# Patient Record
Sex: Male | Born: 1945
Health system: Southern US, Community
[De-identification: ages and names within clinical notes are randomized; demographics above are authoritative.]

## PROBLEM LIST (undated history)

## (undated) ENCOUNTER — Inpatient Hospital Stay: Admission: EM | Payer: Self-pay

## (undated) DIAGNOSIS — H269 Unspecified cataract: Secondary | ICD-10-CM

## (undated) DIAGNOSIS — G4730 Sleep apnea, unspecified: Secondary | ICD-10-CM

## (undated) DIAGNOSIS — E079 Disorder of thyroid, unspecified: Secondary | ICD-10-CM

## (undated) DIAGNOSIS — K219 Gastro-esophageal reflux disease without esophagitis: Secondary | ICD-10-CM

## (undated) DIAGNOSIS — C169 Malignant neoplasm of stomach, unspecified: Secondary | ICD-10-CM

## (undated) DIAGNOSIS — L439 Lichen planus, unspecified: Secondary | ICD-10-CM

## (undated) DIAGNOSIS — I4891 Unspecified atrial fibrillation: Secondary | ICD-10-CM

## (undated) DIAGNOSIS — J45909 Unspecified asthma, uncomplicated: Secondary | ICD-10-CM

## (undated) DIAGNOSIS — L509 Urticaria, unspecified: Secondary | ICD-10-CM

## (undated) DIAGNOSIS — Z79631 Long term (current) use of antimetabolite agent: Secondary | ICD-10-CM

## (undated) DIAGNOSIS — Z79899 Other long term (current) drug therapy: Secondary | ICD-10-CM

## (undated) DIAGNOSIS — L409 Psoriasis, unspecified: Secondary | ICD-10-CM

## (undated) HISTORY — DX: Disorder of thyroid, unspecified: E07.9

## (undated) HISTORY — DX: Psoriasis, unspecified: L40.9

## (undated) HISTORY — PX: TONSILLECTOMY: SUR1361

## (undated) HISTORY — DX: Unspecified cataract: H26.9

## (undated) HISTORY — DX: Urticaria, unspecified: L50.9

## (undated) HISTORY — DX: Sleep apnea, unspecified: G47.30

## (undated) HISTORY — DX: Long term (current) use of antimetabolite agent: Z79.631

## (undated) HISTORY — DX: Lichen planus, unspecified: L43.9

## (undated) HISTORY — DX: Unspecified asthma, uncomplicated: J45.909

## (undated) HISTORY — PX: CATARACT EXTRACTION: SUR2

## (undated) HISTORY — DX: Gastro-esophageal reflux disease without esophagitis: K21.9

## (undated) HISTORY — DX: Other long term (current) drug therapy: Z79.899

---

## 2003-06-08 ENCOUNTER — Ambulatory Visit (HOSPITAL_COMMUNITY): Admission: RE | Admit: 2003-06-08 | Discharge: 2003-06-08 | Payer: Self-pay | Admitting: Gastroenterology

## 2004-08-31 ENCOUNTER — Ambulatory Visit: Payer: Self-pay | Admitting: Sports Medicine

## 2008-11-24 ENCOUNTER — Ambulatory Visit: Payer: Self-pay | Admitting: Sports Medicine

## 2008-11-24 DIAGNOSIS — Q667 Congenital pes cavus, unspecified foot: Secondary | ICD-10-CM | POA: Insufficient documentation

## 2008-11-24 DIAGNOSIS — M79609 Pain in unspecified limb: Secondary | ICD-10-CM

## 2008-11-24 DIAGNOSIS — M7750 Other enthesopathy of unspecified foot: Secondary | ICD-10-CM | POA: Insufficient documentation

## 2008-11-25 ENCOUNTER — Encounter: Payer: Self-pay | Admitting: Sports Medicine

## 2008-12-29 ENCOUNTER — Ambulatory Visit: Payer: Self-pay | Admitting: Sports Medicine

## 2010-09-22 ENCOUNTER — Ambulatory Visit (INDEPENDENT_AMBULATORY_CARE_PROVIDER_SITE_OTHER): Payer: BC Managed Care – PPO | Admitting: Sports Medicine

## 2010-09-22 ENCOUNTER — Encounter: Payer: Self-pay | Admitting: Sports Medicine

## 2010-09-22 VITALS — BP 131/79 | HR 75 | Ht 72.0 in | Wt 280.0 lb

## 2010-09-22 DIAGNOSIS — M75101 Unspecified rotator cuff tear or rupture of right shoulder, not specified as traumatic: Secondary | ICD-10-CM | POA: Insufficient documentation

## 2010-09-22 DIAGNOSIS — S43429A Sprain of unspecified rotator cuff capsule, initial encounter: Secondary | ICD-10-CM

## 2010-09-22 MED ORDER — NITROGLYCERIN 0.2 MG/HR TD PT24: MEDICATED_PATCH | TRANSDERMAL | Status: DC

## 2010-09-22 NOTE — Progress Notes (Signed)
  Subjective:    Patient ID: Todd Harrison, male    DOB: August 09, 1945, 65 y.o.   MRN: [id number]  HPI  Pt presents to clinic for evaluation of anterior rt shoulder pain.  States he had a partial tear in his rt rotator cuff 5 years ago diagnosed by MRI which resulted from weight lifting and a competitive tennis match the next day.  Did rehab on his own for this tear, and pain improved.  Re injured rt shoulder 2 months ago playing tennis, now has rt shoulder pain that feels like burning that radiates into rt upper arm.  Plays upright base which causes pain due to IR.  Has tried ibuprofen, tylenol and diclofenac without relief.  Also has done some of his rehab exercises.  Has been unable to play tennis as much as he would like recently due to the pain with back hand.     New injury occurred when he had to hit a ball and altered position pushing directly forward in front of his body.  He had some immediate pain after that and since has had to back off on his tennis as it hurts anytime he hits.  Review of Systems     Objective:   Physical Exam    Rt scapula slightly internally rotated Mild scapular winging with repeated abduction Speeds and yerguson's negative on rt Slight pain with ER at waist height on rt ER at 90 degrees slightly painful on rt Abduction and elevation slightly painful on rt Significant weakness on rt with empty can Hawkins weak on rt but not painful Neers negative Push off very weak  Obrien's positive Cross over test weak  Musculoskeletal ultrasound of right shoulder Bicipital tendon appears intact and is unremarkable A.c. joint is unremarkable Subscapularis muscle shows an area of hypoechoic change but I think is consistent with a partial tear Supraspinatous muscle shows some areas of calcification, some linear areas of hypoechoic change and some areas of what appear to be retracted fibers.  On scanning for impingement he gets dynamic impingement of his greater  tuberosity under his acromion. Infrasupraspinatous  muscle also shows an area of hypoechoic change near the tendinous insertion.  Assessment & Plan:

## 2010-09-22 NOTE — Patient Instructions (Signed)
Start nitroglycerin patches, apply 1/4 patch to right shoulder daily- you may have headaches for the first 2 weeks Ok to use tens unit  We are referring you to physical therapy- they will call you to set up your appointments.  Return for follow up in 1 month for rescan

## 2010-09-22 NOTE — Assessment & Plan Note (Signed)
This appears to be a significant tear but still is a partial tear since he has pretty full motion and reasonable strength of most muscles of the rotator cuff. I suggested he do formal PT until he can get on a good home exercise program but also want him to work on scapular stabilization for the right shoulder as he shows some winging.  In addition we will begin a nitroglycerin protocol and repeat his scanning in one month and 2 months to see if he is getting a response.  If he responds well will continue this. If not we'll refer him to a shoulder specialist for further assessment.

## 2010-09-22 NOTE — Progress Notes (Signed)
Addended byResa Miner on: 09/22/2010 04:48 PM   Modules accepted: Orders

## 2010-09-23 NOTE — Op Note (Signed)
NAME:  Todd Harrison, Todd Harrison                         ACCOUNT NO.:  [id number]   MEDICAL RECORD NO.:  [id number]                   PATIENT TYPE:  AMB   LOCATION:  ENDO                                 FACILITY:  [facility]   PHYSICIAN:  Graylin Shiver, M.D.                DATE OF BIRTH:  01-27-1946   DATE OF PROCEDURE:  06/08/2003  DATE OF DISCHARGE:                                 OPERATIVE REPORT   PROCEDURE:  Colonoscopy.   INDICATIONS:  Screening.   Informed consent was obtained after explanation of the risks of bleeding,  infection, and perforation.   PREMEDICATION:  Fentanyl 50 mcg IV, Versed 6 mg IV.   DESCRIPTION OF PROCEDURE:  With the patient in the left lateral decubitus  position, a rectal exam was performed.  No masses were felt.  The Olympus  colonoscope was inserted into the rectum and advanced around the colon to  the cecum.  Cecal landmarks were identified.  The cecum and ascending colon  were normal.  The transverse colon normal.  The descending colon, sigmoid,  and rectum were normal.  He tolerated the procedure well without  complication.   IMPRESSION:  Normal colonoscopy to the cecum.   PLAN:  I would recommend follow-up screening colonoscopy again in 10 years.                                               Graylin Shiver, M.D.    Germain Osgood  D:  06/08/2003  T:  06/08/2003  Job:  [id number]   cc:   Antony Madura, M.D.  [address]  [place]  [place] [address]  Fax: [phone]

## 2010-10-12 ENCOUNTER — Ambulatory Visit: Payer: BC Managed Care – PPO | Attending: Sports Medicine | Admitting: Rehabilitative and Restorative Service Providers"

## 2010-10-12 ENCOUNTER — Ambulatory Visit: Payer: BC Managed Care – PPO | Admitting: Rehabilitative and Restorative Service Providers"

## 2010-10-12 DIAGNOSIS — M6281 Muscle weakness (generalized): Secondary | ICD-10-CM | POA: Insufficient documentation

## 2010-10-12 DIAGNOSIS — M25519 Pain in unspecified shoulder: Secondary | ICD-10-CM | POA: Insufficient documentation

## 2010-10-12 DIAGNOSIS — IMO0001 Reserved for inherently not codable concepts without codable children: Secondary | ICD-10-CM | POA: Insufficient documentation

## 2010-10-12 NOTE — Progress Notes (Signed)
Addended by: Annita Brod on: 10/12/2010 09:49 AM   Modules accepted: Orders

## 2010-10-19 ENCOUNTER — Ambulatory Visit: Payer: BC Managed Care – PPO | Admitting: Physical Therapy

## 2010-10-24 ENCOUNTER — Ambulatory Visit: Payer: BC Managed Care – PPO | Admitting: Physical Therapy

## 2010-10-25 ENCOUNTER — Ambulatory Visit (INDEPENDENT_AMBULATORY_CARE_PROVIDER_SITE_OTHER): Payer: BC Managed Care – PPO | Admitting: Family Medicine

## 2010-10-25 ENCOUNTER — Encounter: Payer: Self-pay | Admitting: Family Medicine

## 2010-10-25 VITALS — BP 124/80

## 2010-10-25 DIAGNOSIS — M75101 Unspecified rotator cuff tear or rupture of right shoulder, not specified as traumatic: Secondary | ICD-10-CM

## 2010-10-25 DIAGNOSIS — S43429A Sprain of unspecified rotator cuff capsule, initial encounter: Secondary | ICD-10-CM

## 2010-10-26 NOTE — Assessment & Plan Note (Signed)
30-40% improved over last month. - [facility] ultrasound shows some signs of healing along the subscap.  Continues to have persistent gapping within supraspinatus, but does appear to be getting some calcifications in this area compared to previous ultrasound.  Infraspinatus appeared unchanged. - Should continue with formal physical therapy - Continue with NTG protocol - ok to start using 1/2 patch since 1/4 patch too difficulty to cut & apply.  Warned of potential side effects, should go back to 1/4 patch if having significant headaches or other side effects - Do not feel referral to shoulder specialist necessary at this time since he is improving - Follow-up in 4-6 weeks for re-evaluation & repeat ultrasound - Call with any questions or concerns.

## 2010-10-26 NOTE — Progress Notes (Signed)
  Subjective:    Patient ID: Todd Harrison, male    DOB: October 23, 1945, 65 y.o.   MRN: [id number]  HPI 64yo male to office for f/u of R shoulder RTC tear.  Feels 30-40% improved today.  Has been doing to formal physical therapy & using NTG 1/3 of a patch.  Denies any side effects from NTG.  Has occasional tightness in the right side of his neck, denies any numbness or tingling.  Decreased night time symptoms & able to sleep comfortably most nights.  Review of Systems Per HPI, otherwise negative    Objective:   Physical Exam GEN: AOx3, NAD, pleasant SKIN: no rashes or lesions MSK: - C-spine: good ROM without pain.  No midline TTP, mild Rt paraspinal tenderness, no muscle spasm appreciated - Shoulders: Rt shoulder- scapula still slightly internally rotated & continues to have mild winging with repeated abduction.  TTP at [facility] joint and bicipital groove.  ROM with flexion 160, abd 160 with pain, ER 60 with pain, IR to sacrum with pain.  Weakness & pain with Empty Can, very weak with Push-off test & painful.  Neg Hawkins & Neer, neg Speeds, Obrien's equivocal.  Lt shoulder with good ROM without pain or weakness. Neurovascularly intact distally  MSK U/S: Rt shoulder- normal appearing biceps tendon.  Subscap still with some areas of hypoechoic change, but is starting to develop surrounding calcifications in that area consistent with healing partial tear; slight increased doppler flow.  Supraspinatus again showing areas of calcification, has large area of hyopechoic change with some surrounding calcifications, does appears to have some retracted fibers in this area; slight increased doppler flow.  This is slightly improved from last u/s.  Infraspinatus continues to show hypoechoic change near tendinous insertion that is unchanged.  Continues to exhibit dynamic impingement of greater tuberosity under acromion.  Images saved.       Assessment & Plan:

## 2010-10-27 ENCOUNTER — Ambulatory Visit: Payer: BC Managed Care – PPO | Admitting: Rehabilitative and Restorative Service Providers"

## 2010-10-31 ENCOUNTER — Ambulatory Visit: Payer: BC Managed Care – PPO

## 2010-11-02 ENCOUNTER — Ambulatory Visit: Payer: BC Managed Care – PPO

## 2010-11-14 ENCOUNTER — Encounter: Payer: BC Managed Care – PPO | Admitting: Rehabilitative and Restorative Service Providers"

## 2010-11-16 ENCOUNTER — Ambulatory Visit: Payer: BC Managed Care – PPO | Attending: Sports Medicine | Admitting: Rehabilitative and Restorative Service Providers"

## 2010-11-16 DIAGNOSIS — M25519 Pain in unspecified shoulder: Secondary | ICD-10-CM | POA: Insufficient documentation

## 2010-11-16 DIAGNOSIS — M6281 Muscle weakness (generalized): Secondary | ICD-10-CM | POA: Insufficient documentation

## 2010-11-16 DIAGNOSIS — IMO0001 Reserved for inherently not codable concepts without codable children: Secondary | ICD-10-CM | POA: Insufficient documentation

## 2010-12-06 ENCOUNTER — Ambulatory Visit: Payer: BC Managed Care – PPO | Admitting: Sports Medicine

## 2010-12-16 ENCOUNTER — Ambulatory Visit (INDEPENDENT_AMBULATORY_CARE_PROVIDER_SITE_OTHER): Payer: BC Managed Care – PPO | Admitting: Sports Medicine

## 2010-12-16 VITALS — BP 119/84

## 2010-12-16 DIAGNOSIS — S43429A Sprain of unspecified rotator cuff capsule, initial encounter: Secondary | ICD-10-CM

## 2010-12-16 DIAGNOSIS — M75101 Unspecified rotator cuff tear or rupture of right shoulder, not specified as traumatic: Secondary | ICD-10-CM

## 2010-12-17 NOTE — Progress Notes (Signed)
  Subjective:    Patient ID: Todd Harrison, male    DOB: 1945/09/07, 65 y.o.   MRN: [id number]  HPI  Todd Harrison is a pleasant 65 yo male patient comming to F/U on his R RTC tear. He was seen previously on 10/2010, he was started on the nitro protocol and RTC strengthening exercises. 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  No numbness or tingling distally. He has tolerated the nitro patch w/ no complications.  Patient Active Problem List  Diagnoses  . METATARSALGIA  . FOOT PAIN, LEFT  . TALIPES CAVUS  . Rotator cuff tear, right   Current Outpatient Prescriptions on File Prior to Visit  Medication Sig Dispense Refill  . nitroGLYCERIN (NITRODUR - DOSED IN MG/24 HR) 0.2 mg/hr Apply 1/4 patch to rt shoulder daily.  Change patch every 24 hours  30 patch  2   No Known Allergies History  Substance Use Topics  . Smoking status: Former Games developer  . Smokeless tobacco: Never Used  . Alcohol Use: Not on file      Review of Systems  Constitutional: Negative for fever, chills, diaphoresis and fatigue.  Musculoskeletal:       R shoulder pain w/HPI  Skin: Negative for color change, pallor, rash and wound.  Neurological: Negative for numbness.       Objective:   Physical Exam  Constitutional: He appears well-developed and well-nourished.       BP 119/84   Eyes: EOM are normal.  Pulmonary/Chest: Effort normal.  Musculoskeletal:       Right shoulder with intact skin. No swelling no hematomas. Full range of motion for flexion extension internal, external rotation abduction and adduction. Abduction w/ pain at 110 degrees. Hawkins test mild positive for tear cuff impingement . A.c. Joint with no tenderness to palpation with positive crossover test . Empty can test negative for rotator cuff tear, with weakness and mild pain. Speed test  negative for biceps tendinoplasty No tenderness at the bicipital groove. Strength 5 over 5 with internal and external rotation . Sensation is intact .  Neurological: He is alert.  Skin: Skin is warm. No rash noted. No erythema. No pallor.  Psychiatric: He has a normal mood and affect. Judgment normal.    MSK U/S: Rt shoulder- normal appearing biceps tendon. Subscap still with some areas of hypoechoic change, but is starting to develop surrounding calcifications in that area consistent with healing partial tear; slight increased doppler flow . Supraspinatus again showing areas of calcification,does appears to have some retracted fibers in this area; slight increased doppler flow. This is slightly improved from last u/s.  Infraspinatus with no hypoechoic images present in last U/S change near tendinous insertion that is unchanged.  Dynamic impingement of greater tuberosity under acromion has improved. Images saved.      Assessment & Plan:    1. Rotator cuff tear, right    Cont nitro patch to complete 6 month. Strengthening RTC protocol demonstrated and handout given. He can start playing tennis hitting ground ball and being carefull with serving. F/U in 6 weeks.

## 2011-03-17 ENCOUNTER — Ambulatory Visit (INDEPENDENT_AMBULATORY_CARE_PROVIDER_SITE_OTHER): Payer: BC Managed Care – PPO | Admitting: Sports Medicine

## 2011-03-17 VITALS — BP 136/84

## 2011-03-17 DIAGNOSIS — M75101 Unspecified rotator cuff tear or rupture of right shoulder, not specified as traumatic: Secondary | ICD-10-CM

## 2011-03-17 DIAGNOSIS — S43429A Sprain of unspecified rotator cuff capsule, initial encounter: Secondary | ICD-10-CM

## 2011-03-17 MED ORDER — NITROGLYCERIN 0.2 MG/HR TD PT24: MEDICATED_PATCH | TRANSDERMAL | Status: DC

## 2011-03-17 NOTE — Patient Instructions (Signed)
We will go to 3/4 of a patch. Continue jobes shoulder exercise Motrin 600 mg after playing to come down irritation. F/U in 6 weeks.

## 2011-03-17 NOTE — Progress Notes (Signed)
  Subjective:    Patient ID: Todd Harrison, male    DOB: April 12, 1946, 65 y.o.   MRN: [id number]  HPI  Coming to F/u om R shoulder RTC tear. He has been on the nitro patch sine 10/2010 and in 1/2 patch for 2 month. He is 30% better. He has been able to play. He complain of pain after playing tennis, the pain is located in the anterolateral aspect of the shoulder, 3/10 intensity, sharp, on and off, not radiated, worse after playing tennis, last for few hours. Overall he is improving  Patient Active Problem List  Diagnoses  . METATARSALGIA  . FOOT PAIN, LEFT  . TALIPES CAVUS  . Rotator cuff tear, right    No current outpatient prescriptions on file prior to visit.   No Known Allergies    Review of Systems  Constitutional: Negative for fever, chills, diaphoresis and fatigue.  Musculoskeletal: Negative for myalgias, back pain, joint swelling and gait problem.  Neurological: Negative for weakness and numbness.       Objective:   Physical Exam  Constitutional: He is oriented to person, place, and time. He appears well-developed and well-nourished.       BP 136/84   Neck: Normal range of motion. Neck supple.  Pulmonary/Chest: Effort normal.  Musculoskeletal:       Right shoulder with intact skin. No swelling no hematomas. Full range of motion for flexion extension internal, external rotation abduction and adduction. Abduction w/ pain at 110 degrees. Hawkins test mild positive for tear cuff impingement . A.c. Joint with no tenderness to palpation with positive crossover test . Empty can test negative for rotator cuff tear, with weakness and mild pain. Speed test negative for biceps tendinoplasty No tenderness at the bicipital groove. Strength 4 over 5 with internal and external rotation . Sensation is intact .    Neurological: He is alert and oriented to person, place, and time.  Skin: Skin is warm. No rash noted. No erythema. No pallor.  Psychiatric: He has a normal mood and  affect. His behavior is normal.      MSK U/S : R shoulder with a tear in the supraspinatus, there is a fluid in the subacromial bursa. Positive impingement.AC joint DJD      Assessment & Plan:   1. Rotator cuff tear, right  nitroGLYCERIN (NITRODUR - DOSED IN MG/24 HR) 0.2 mg/hr   We will go to 3/4 of a patch. Continue jobes shoulder exercise Motrin 600 mg after playing to come down irritation. F/U in 6 weeks.

## 2011-05-15 ENCOUNTER — Encounter: Payer: Self-pay | Admitting: Sports Medicine

## 2011-05-15 ENCOUNTER — Ambulatory Visit: Payer: BC Managed Care – PPO | Admitting: Sports Medicine

## 2011-05-15 ENCOUNTER — Ambulatory Visit (INDEPENDENT_AMBULATORY_CARE_PROVIDER_SITE_OTHER): Payer: BC Managed Care – PPO | Admitting: Sports Medicine

## 2011-05-15 VITALS — BP 126/85 | HR 86

## 2011-05-15 DIAGNOSIS — M75101 Unspecified rotator cuff tear or rupture of right shoulder, not specified as traumatic: Secondary | ICD-10-CM

## 2011-05-15 DIAGNOSIS — S43429A Sprain of unspecified rotator cuff capsule, initial encounter: Secondary | ICD-10-CM

## 2011-05-15 MED ORDER — METHYLPREDNISOLONE ACETATE 40 MG/ML IJ SUSP: 40.0000 mg | Freq: Once | INTRAMUSCULAR | Status: DC

## 2011-05-15 NOTE — Progress Notes (Signed)
  Subjective:    Patient ID: Todd Harrison, male    DOB: March 23, 1946, 66 y.o.   MRN: [id number]  HPI 66 y/o male is here for follow up for right shoulder pain secondary to a supraspinatus tear.  He initially improved.  However, on December first after a tennis game he noticed a new pain in the same shoulder.  It started when he went after a wide serve.  Since then he has started having pain with overhead motions and with lifting.  He is still wearing his NTG patches and doing his HEP.   Review of Systems     Objective:   Physical Exam  Abduction to 110 active (180 passive) Forward flexion to 170 active (180 passive) Internal rotation to T10 External rotation to 90  Positive empty can Equivocal Hawkins Negative Speed  Decreased rotator cuff strength but no drop arm  Ultrasound: The previous supraspinatus tear is decreased in size and has calcifications within the tendon c/w healing.  There is a new area of the supraspinatus with a tear.  There is some fluid in the bursa of the infraspinatus.  The subscapularis and biceps tendons are unremarkable.  Dynamic impingement testing is negative.  Consent obtained and verified. Cleansed with alcohol. Topical analgesic spray: Ethyl chloride. Joint: right subacromial Approached in typical fashion from the posterior aspect. Completed without difficulty Meds: 40mg  of depo medrol; 6 ml of lidocaine without epinephrine Needle: 25 g Aftercare instructions and Red flags advised.      Assessment & Plan:

## 2011-05-15 NOTE — Assessment & Plan Note (Signed)
He has re injured the rotator so we will restart his treatment.  He got an injection today which should decrease the inflammation.  He will back off on his HEP, only doing ROM.  When he follows up in 2 weeks we will advance to strength training if he is improved.  Discussed formal physical therapy but the patient prefers to do it at home.  He did have a question about his orthotics.  We will address this in a follow up visit.  On his follow up we will also make him a new pair of orthotics for his feet issues.

## 2011-05-15 NOTE — Patient Instructions (Signed)
1. You can continue with your range of motion exercises but stop doing the strength ones.  2. Follow up with [place] in 2 weeks.  3. If you pain at night you can take ibuprofen.

## 2011-05-30 ENCOUNTER — Ambulatory Visit (INDEPENDENT_AMBULATORY_CARE_PROVIDER_SITE_OTHER): Payer: BC Managed Care – PPO | Admitting: Sports Medicine

## 2011-05-30 VITALS — BP 128/78

## 2011-05-30 DIAGNOSIS — M79609 Pain in unspecified limb: Secondary | ICD-10-CM

## 2011-05-30 DIAGNOSIS — S43429A Sprain of unspecified rotator cuff capsule, initial encounter: Secondary | ICD-10-CM

## 2011-05-30 DIAGNOSIS — M75101 Unspecified rotator cuff tear or rupture of right shoulder, not specified as traumatic: Secondary | ICD-10-CM

## 2011-05-30 DIAGNOSIS — M7750 Other enthesopathy of unspecified foot: Secondary | ICD-10-CM

## 2011-05-30 NOTE — Patient Instructions (Signed)
Motion is much improved  Continue motion with  Walk wall in front and to side  Continue rotator cuff strength with 5 lbs in 3 positions  Lie on back and stretch arms out to the side and hold for 30 seconds- repeat 3 times  Once you have done suggested shoulder exercises for 1-2 weeks- start trying tennis motions with a 5 lb dumbbell   Please follow up in 1 month

## 2011-05-30 NOTE — Assessment & Plan Note (Signed)
Physical exam for the rotator cuff is improved  corticosteroid injection lessened his symptoms a lot  Keep up rehabilitation exercises and we also gave him some range of motion exercises to maintain Use 5 pound dumbbells Below shoulder height for most activities  Continue nitroglycerin protocol  Recheck in 4 weeks and repeat his ultrasound at that visit

## 2011-05-30 NOTE — Assessment & Plan Note (Signed)
He requires a rigid supination post for his left orthotic to keep him from excessive supination On orthotic show that this has worn down  Patient was fitted for a : standard, cushioned, semi-rigid orthotic. The orthotic was heated and afterward the patient stood on the orthotic blank positioned on the orthotic stand. The patient was positioned in subtalar neutral position and 10 degrees of ankle dorsiflexion in a weight bearing stance. After completion of molding, a stable base was applied to the orthotic blank. The blank was ground to a stable position for weight bearing. Size: 11 blue swirl Base: blue EVA Posting: lateral 5th ray post on left Additional orthotic padding:lateral heel post on left  Time 45 mins   Note that corrections were made for his orthotics so that he can continue using these in walking shoes and he can use the new ones in tennis shoes

## 2011-05-30 NOTE — Assessment & Plan Note (Signed)
Continue with metatarsal pads and these are related to his new orthotics

## 2011-05-30 NOTE — Progress Notes (Signed)
Pt here today to f/u with his R rotator cuff injury, which he says is about 40% with the injections;"not where he wants it to be, but better".   He has held off on the exercises until we recheck him He has worked on his motion and feels that he can get his arm over his head easier  Second problem is his left foot and ankle He walks with a lot of supination of the left foot after multiple ankle injuries The right foot is not bothersome  The left foot and ankle become painful if he is not in his orthotics His current pair is 64 years old and does not seem to be providing him as much support  O/ Today he has full elevation of motion of his right shoulder although he does feel some mild discomfort with elevation and abduction about 140 Back scratch test is limited to T10 note that this was a T12 on last visit Left shoulder only shows a back scratch to T9 Internal and external rotation at waist level are full  Strength is good on all testing Empty can test today shows only mild pain and no real weakness Hawkins test shows mild pain only Neer test is negative

## 2011-06-28 ENCOUNTER — Ambulatory Visit (INDEPENDENT_AMBULATORY_CARE_PROVIDER_SITE_OTHER): Payer: BC Managed Care – PPO | Admitting: Sports Medicine

## 2011-06-28 VITALS — BP 130/90

## 2011-06-28 DIAGNOSIS — S43429A Sprain of unspecified rotator cuff capsule, initial encounter: Secondary | ICD-10-CM

## 2011-06-28 DIAGNOSIS — M75101 Unspecified rotator cuff tear or rupture of right shoulder, not specified as traumatic: Secondary | ICD-10-CM

## 2011-06-28 NOTE — Progress Notes (Signed)
  Subjective:    Patient ID: Todd Harrison, male    DOB: 04-05-1946, 66 y.o.   MRN: [id number]  HPI  Pt presents to clinic for f/u of rt shoulder pain which he states is 60-70% improved. He is using 3 lb weights for exercises, and is able to do them over head now. Does not have pain with playing the base.  No night time pain. Still has some soreness with certain motions, like taking cup out of microwave. Continues using 1/4 nitroglycerin patch daily.   He can do tennis strokes with a 3 pound weight without pain but has not tried playing  Review of Systems     Objective:   Physical Exam   NAD Nml ROM  Back scratch to T 9 or 10 on rt, and T 8 on lt Rt shoulder exam: Empty can negative Hawkins negative Speed's and yergason's neg ER, IR, abduction and elevation strong  [facility] ultrasound On last visit we were able to visualize some hypoechoic change in retraction and the distal attachment of the supraspinatous tendon this has now resolved with a small amount of calcification in the area of previous tear Bicipital tendon normal Infraspinatus teres minor and subscapularis are normal  A.c. joint shows only mild arthritis  On dynamic imaging there is still some impingement on repeated abduction        Assessment & Plan:

## 2011-06-28 NOTE — Assessment & Plan Note (Signed)
While we have head to extend his nitroglycerin therapy to 9 months now we are seeing improvement without signs of tear in the supraspinatous and complete resolution of the changes in the infraspinatus and subscapularis.  He is to continue using the nitroglycerin patches for 3 more months  We added a few exercises to his rotator cuff regimen and he should build up to 5 pounds  Gradually resuming some tennis activity  Recheck 3 months

## 2011-06-28 NOTE — Patient Instructions (Signed)
Please continue using nitroglycerin patch for the next 3 months  Continue doing rotator cuff exercises- try increasing gradually to 5lb dumbells Add standing rowing motion and chest flies with light weights Practice doing tennis strokes holding a 3-5lb dumbbell   Please follow up in 3 months  Thank you for seeing [place] today!

## 2014-04-13 ENCOUNTER — Other Ambulatory Visit: Payer: Self-pay | Admitting: Otolaryngology

## 2014-04-13 DIAGNOSIS — R0981 Nasal congestion: Secondary | ICD-10-CM

## 2014-04-13 DIAGNOSIS — J329 Chronic sinusitis, unspecified: Secondary | ICD-10-CM

## 2014-05-15 ENCOUNTER — Ambulatory Visit
Admission: RE | Admit: 2014-05-15 | Discharge: 2014-05-15 | Disposition: A | Discharge status: Home / Self Care | Payer: BLUE CROSS/BLUE SHIELD | Source: Ambulatory Visit | Attending: Otolaryngology | Admitting: Otolaryngology

## 2014-05-15 DIAGNOSIS — R0981 Nasal congestion: Secondary | ICD-10-CM

## 2014-05-15 DIAGNOSIS — J329 Chronic sinusitis, unspecified: Secondary | ICD-10-CM

## 2016-02-10 ENCOUNTER — Other Ambulatory Visit: Payer: Self-pay | Admitting: Internal Medicine

## 2016-02-10 ENCOUNTER — Ambulatory Visit
Admission: RE | Admit: 2016-02-10 | Discharge: 2016-02-10 | Disposition: A | Discharge status: Home / Self Care | Payer: BLUE CROSS/BLUE SHIELD | Source: Ambulatory Visit | Attending: Internal Medicine | Admitting: Internal Medicine

## 2016-02-10 DIAGNOSIS — R059 Cough, unspecified: Secondary | ICD-10-CM

## 2016-02-10 DIAGNOSIS — R05 Cough: Secondary | ICD-10-CM

## 2016-06-09 ENCOUNTER — Ambulatory Visit (INDEPENDENT_AMBULATORY_CARE_PROVIDER_SITE_OTHER): Payer: BLUE CROSS/BLUE SHIELD | Admitting: Family Medicine

## 2016-06-09 VITALS — BP 132/84 | HR 78 | Temp 98.3°F | Resp 18 | Ht 72.0 in | Wt 300.0 lb

## 2016-06-09 DIAGNOSIS — J0101 Acute recurrent maxillary sinusitis: Secondary | ICD-10-CM

## 2016-06-09 DIAGNOSIS — E039 Hypothyroidism, unspecified: Secondary | ICD-10-CM

## 2016-06-09 MED ORDER — AMOXICILLIN-POT CLAVULANATE 875-125 MG PO TABS: 1.0000 | ORAL_TABLET | Freq: Two times a day (BID) | ORAL | 0 refills | Status: DC

## 2016-06-09 NOTE — Patient Instructions (Addendum)

## 2016-06-09 NOTE — Progress Notes (Signed)
  Chief Complaint  Patient presents with  . Sore Throat  . Sinusitis    HPI   Recurrent sinusitis Pt reports that at the end of the summer he was given antibiotic for sinus infection  He reports that last week he started having coughing, postnasal drip, dental pain, sore throat, wheezing, yellow nasal discharge + fevers or chills a week ago He received a flu vaccine He is a former smoker- 35 years ago he quits  Thyroid disease Pt reports that he has been stable on his thyroid dose He denies fatigue, difficulty with maintaining energy lately but admits that in the past he had that He denies missing many of his levothyroxine doses   Past Medical History:  Diagnosis Date  . Thyroid disease     Current Outpatient Prescriptions  Medication Sig Dispense Refill  . FLUTICASONE PROPIONATE HFA IN Inhale into the lungs.    Marland Kitchen levothyroxine (SYNTHROID, LEVOTHROID) 100 MCG tablet Take 100 mcg by mouth daily.    Marland Kitchen omeprazole (PRILOSEC) 20 MG capsule Take 20 mg by mouth daily.    Marland Kitchen amoxicillin-clavulanate (AUGMENTIN) 875-125 MG tablet Take 1 tablet by mouth 2 (two) times daily. 20 tablet 0  . fluticasone (FLONASE) 50 MCG/ACT nasal spray Place into the nose.     Current Facility-Administered Medications  Medication Dose Route Frequency Provider Last Rate Last Dose  . methylPREDNISolone acetate (DEPO-MEDROL) injection 40 mg  40 mg Intra-articular Once Claris Gower, MD        Allergies: No Known Allergies  History reviewed. No pertinent surgical history.  Social History   Social History  . Marital status: Married    Spouse name: N/A  . Number of children: N/A  . Years of education: N/A   Social History Main Topics  . Smoking status: Former Research scientist (life sciences)  . Smokeless tobacco: Never Used  . Alcohol use 0.6 oz/week    1 Glasses of wine per week  . Drug use: No  . Sexual activity: Not Asked   Other Topics Concern  . None   Social History Narrative  . None    ROS See  hpi  Objective: Vitals:   06/09/16 1357  BP: 132/84  Pulse: 78  Resp: 18  Temp: 98.3 F (36.8 C)  TempSrc: Oral  SpO2: 96%  Weight: 300 lb (136.1 kg)  Height: 6' (1.829 m)    Physical Exam General: alert, oriented, in NAD Head: normocephalic, atraumatic, + maxillary sinus tenderness Eyes: EOM intact, no scleral icterus or conjunctival injection Ears: TM clear bilaterally Throat: no pharyngeal exudate or erythema Lymph: no posterior auricular, submental or cervical lymph adenopathy Heart: normal rate, normal sinus rhythm, no murmurs Lungs: clear to auscultation bilaterally, no wheezing   Assessment and Plan Dinesh was seen today for sore throat and sinusitis.  Diagnoses and all orders for this visit:  Acute recurrent maxillary sinusitis- discussed with patient that he should take antibiotic and continue flonase -     amoxicillin-clavulanate (AUGMENTIN) 875-125 MG tablet; Take 1 tablet by mouth 2 (two) times daily.  Acquired hypothyroidism- advised pt to continue his present dose      Duran Ohern A Nolon Rod

## 2016-08-03 ENCOUNTER — Ambulatory Visit (INDEPENDENT_AMBULATORY_CARE_PROVIDER_SITE_OTHER): Payer: BLUE CROSS/BLUE SHIELD | Admitting: Physician Assistant

## 2016-08-03 ENCOUNTER — Ambulatory Visit (INDEPENDENT_AMBULATORY_CARE_PROVIDER_SITE_OTHER): Payer: BLUE CROSS/BLUE SHIELD

## 2016-08-03 ENCOUNTER — Ambulatory Visit: Payer: BLUE CROSS/BLUE SHIELD

## 2016-08-03 VITALS — BP 138/88 | HR 90 | Temp 98.8°F | Resp 18 | Ht 72.0 in | Wt 303.0 lb

## 2016-08-03 DIAGNOSIS — R062 Wheezing: Secondary | ICD-10-CM | POA: Diagnosis not present

## 2016-08-03 DIAGNOSIS — R05 Cough: Secondary | ICD-10-CM

## 2016-08-03 DIAGNOSIS — R059 Cough, unspecified: Secondary | ICD-10-CM

## 2016-08-03 DIAGNOSIS — J329 Chronic sinusitis, unspecified: Secondary | ICD-10-CM | POA: Diagnosis not present

## 2016-08-03 MED ORDER — IPRATROPIUM BROMIDE 0.02 % IN SOLN
0.5000 mg | Freq: Once | RESPIRATORY_TRACT | Status: AC
  Administered 2016-08-03: 0.5 mg via RESPIRATORY_TRACT

## 2016-08-03 MED ORDER — DOXYCYCLINE HYCLATE 100 MG PO CAPS: 100.0000 mg | ORAL_CAPSULE | Freq: Two times a day (BID) | ORAL | 0 refills | Status: DC

## 2016-08-03 MED ORDER — ALBUTEROL SULFATE (2.5 MG/3ML) 0.083% IN NEBU
2.5000 mg | INHALATION_SOLUTION | Freq: Once | RESPIRATORY_TRACT | Status: AC
  Administered 2016-08-03: 2.5 mg via RESPIRATORY_TRACT

## 2016-08-03 NOTE — Progress Notes (Signed)
MRN: [id number] DOB: Dec 25, 1945  Subjective:   Todd Harrison is a 71 y.o. male presenting for chief complaint of URI (sinus pressure/pain, swollen, cough, green plegm, sinus drainage sxs x 1 week , wheezing ) .  Reports 7 day history of sinus congestion, sinus pain and cough. Notes his cough was dry but over the past few days has become productive. He has been coughing up and blowing out green phlegm. Has associated wheezing.  Has tried flonase, neti pot, benedryl with mild elief. Denies fever, ear pain, sore throat, shortness of breath and myalgia, chills, nausea, vomiting, abdominal pain and diarrhea. Has not had sick contact with anyone. Has  history of seasonal allergies, no history of asthma. Patient did not get flu shot this season. Former smoker, quit 30 years, smoked 4 ppd. Denies any other aggravating or relieving factors, no other questions or concerns.  [name] has a current medication list which includes the following prescription(s): fluticasone, fluticasone propionate hfa, levothyroxine, and omeprazole, and the following Facility-Administered Medications: albuterol, ipratropium, and methylprednisolone acetate. Also has No Known Allergies.  [name]  has a past medical history of Thyroid disease. Also  has no past surgical history on file.   Objective:   Vitals: BP 138/88 (BP Location: Right Arm, Patient Position: Sitting, Cuff Size: Large)   Pulse 90   Temp 98.8 F (37.1 C) (Oral)   Resp 18   Ht 6' (1.829 m)   Wt (!) 303 lb (137.4 kg)   SpO2 94%   BMI 41.09 kg/m   Physical Exam  Constitutional: He is oriented to person, place, and time. He appears well-developed and well-nourished. No distress.  HENT:  Head: Normocephalic and atraumatic.  Right Ear: External ear and ear canal normal. Tympanic membrane is injected.  Left Ear: External ear and ear canal normal. Tympanic membrane is injected.  Nose: Mucosal edema (prominent bilatteally) present. Right sinus exhibits  maxillary sinus tenderness and frontal sinus tenderness. Left sinus exhibits maxillary sinus tenderness and frontal sinus tenderness.  Mouth/Throat: Uvula is midline and mucous membranes are normal. Posterior oropharyngeal erythema present. No tonsillar exudate.  Eyes: Conjunctivae are normal.  Neck: Normal range of motion.  Cardiovascular: Normal rate, regular rhythm and normal heart sounds.   Pulmonary/Chest: Effort normal. He has wheezes (right middle lobe). He has rhonchi (right middle lobe ).  Lymphadenopathy:       Head (right side): No submental, no submandibular, no tonsillar, no preauricular, no posterior auricular and no occipital adenopathy present.       Head (left side): No submental, no submandibular, no tonsillar, no preauricular, no posterior auricular and no occipital adenopathy present.    He has no cervical adenopathy.       Right: No supraclavicular adenopathy present.       Left: No supraclavicular adenopathy present.  Neurological: He is alert and oriented to person, place, and time.  Skin: Skin is warm and dry.  Psychiatric: He has a normal mood and affect.  Vitals reviewed.   No results found for this or any previous visit (from the past 24 hour(s)).  Dg Chest 2 View  Result Date: 08/03/2016 CLINICAL DATA:  Cough and wheezing EXAM: CHEST  2 VIEW COMPARISON:  February 10, 2016 FINDINGS: There is no edema or consolidation. The heart size and pulmonary vascularity are normal. No adenopathy. There is atherosclerotic calcification. There is degenerative change in the thoracic spine. IMPRESSION: No edema or consolidation.  There is aortic atherosclerosis. Electronically Signed   By: Gwyndolyn Saxon  Jasmine December III M.D.   On: 08/03/2016 13:04   Post duoneb and depo medrol injection, wheezing has resolved.  Assessment and Plan :  1. Cough - DG Chest 2 View; Future 2. Wheezing -Improved in office. - albuterol (PROVENTIL) (2.5 MG/3ML) 0.083% nebulizer solution 2.5 mg; Take 3 mLs (2.5  mg total) by nebulization once. - ipratropium (ATROVENT) nebulizer solution 0.5 mg; Take 2.5 mLs (0.5 mg total) by nebulization once. -Instructed to use albuterol inhaler every 4-6 hours as needed for wheezing.  3. Sinusitis, unspecified chronicity, unspecified location Due to duration of symptoms, will cover for any underlying bacterial etiology.  - doxycycline (VIBRAMYCIN) 100 MG capsule; Take 1 capsule (100 mg total) by mouth 2 (two) times daily.  Dispense: 20 capsule; Refill: 0  Instructed to return to clinic if symptoms worsen, do not improve, or as needed  Tenna Delaine, PA-C  [facility] 08/03/2016 1:32 PM

## 2016-08-03 NOTE — Patient Instructions (Addendum)
For both your sinus congestion and cough, I am going to treat you with an antibiotic. You should start getting better over the next 24-48 hours, not worse. If no improvement with antibiotic, please return to clinic for further evaluation.  Thank you for letting me participate in your health and well being.   Sinusitis, Adult Sinusitis is soreness and inflammation of your sinuses. Sinuses are hollow spaces in the bones around your face. They are located:  Around your eyes.  In the middle of your forehead.  Behind your nose.  In your cheekbones. Your sinuses and nasal passages are lined with a stringy fluid (mucus). Mucus normally drains out of your sinuses. When your nasal tissues get inflamed or swollen, the mucus can get trapped or blocked so air cannot flow through your sinuses. This lets bacteria, viruses, and funguses grow, and that leads to infection. Follow these instructions at home: Medicines   Take, use, or apply over-the-counter and prescription medicines only as told by your doctor. These may include nasal sprays.  If you were prescribed an antibiotic medicine, take it as told by your doctor. Do not stop taking the antibiotic even if you start to feel better. Hydrate and Humidify   Drink enough water to keep your pee (urine) clear or pale yellow.  Use a cool mist humidifier to keep the humidity level in your home above 50%.  Breathe in steam for 10-15 minutes, 3-4 times a day or as told by your doctor. You can do this in the bathroom while a hot shower is running.  Try not to spend time in cool or dry air. Rest   Rest as much as possible.  Sleep with your head raised (elevated).  Make sure to get enough sleep each night. General instructions   Put a warm, moist washcloth on your face 3-4 times a day or as told by your doctor. This will help with discomfort.  Wash your hands often with soap and water. If there is no soap and water, use hand sanitizer.  Do not  smoke. Avoid being around people who are smoking (secondhand smoke).  Keep all follow-up visits as told by your doctor. This is important. Contact a doctor if:  You have a fever.  Your symptoms get worse.  Your symptoms do not get better within 10 days. Get help right away if:  You have a very bad headache.  You cannot stop throwing up (vomiting).  You have pain or swelling around your face or eyes.  You have trouble seeing.  You feel confused.  Your neck is stiff.  You have trouble breathing. This information is not intended to replace advice given to you by your health care provider. Make sure you discuss any questions you have with your health care provider. Document Released: 10/11/2007 Document Revised: 12/19/2015 Document Reviewed: 02/17/2015 Elsevier Interactive Patient Education  2017 [employer].    IF you received an x-ray today, you will receive an invoice from [facility]. Please contact [facility] at [phone] with questions or concerns regarding your invoice.   IF you received labwork today, you will receive an invoice from [facility]. Please contact [facility] at [phone] with questions or concerns regarding your invoice.   Our billing staff will not be able to assist you with questions regarding bills from these companies.  You will be contacted with the lab results as soon as they are available. The fastest way to get your results is to activate your My Chart account. Instructions  are located on the last page of this paperwork. If you have not heard from [place] regarding the results in 2 weeks, please contact this office.

## 2016-08-10 ENCOUNTER — Ambulatory Visit (INDEPENDENT_AMBULATORY_CARE_PROVIDER_SITE_OTHER): Payer: BLUE CROSS/BLUE SHIELD | Admitting: Physician Assistant

## 2016-08-10 VITALS — BP 134/80 | HR 71 | Temp 97.5°F | Resp 18 | Wt 301.4 lb

## 2016-08-10 DIAGNOSIS — R062 Wheezing: Secondary | ICD-10-CM

## 2016-08-10 DIAGNOSIS — R21 Rash and other nonspecific skin eruption: Secondary | ICD-10-CM

## 2016-08-10 DIAGNOSIS — R05 Cough: Secondary | ICD-10-CM | POA: Diagnosis not present

## 2016-08-10 DIAGNOSIS — R059 Cough, unspecified: Secondary | ICD-10-CM

## 2016-08-10 LAB — CMP14+EGFR
ALBUMIN: 3.6 g/dL (ref 3.6–4.8)
ALT: 18 IU/L (ref 0–44)
AST: 16 IU/L (ref 0–40)
Albumin/Globulin Ratio: 1.5 (ref 1.2–2.2)
Alkaline Phosphatase: 62 IU/L (ref 39–117)
BILIRUBIN TOTAL: 0.4 mg/dL (ref 0.0–1.2)
BUN/Creatinine Ratio: 12 (ref 10–24)
BUN: 14 mg/dL (ref 8–27)
CALCIUM: 8.9 mg/dL (ref 8.6–10.2)
CHLORIDE: 102 mmol/L (ref 96–106)
CO2: 22 mmol/L (ref 18–29)
Creatinine, Ser: 1.2 mg/dL (ref 0.76–1.27)
GFR calc non Af Amer: 62 mL/min/{1.73_m2} (ref >59)
GFR, EST AFRICAN AMERICAN: 71 mL/min/{1.73_m2} (ref >59)
GLUCOSE: 106 mg/dL — AB (ref 65–99)
Globulin, Total: 2.4 g/dL (ref 1.5–4.5)
Potassium: 4.4 mmol/L (ref 3.5–5.2)
Sodium: 139 mmol/L (ref 134–144)
TOTAL PROTEIN: 6 g/dL (ref 6.0–8.5)

## 2016-08-10 LAB — POCT CBC
Granulocyte percent: 62.8 %G (ref 37–80)
HEMATOCRIT: 42.3 % — AB (ref 43.5–53.7)
HEMOGLOBIN: 14.8 g/dL (ref 14.1–18.1)
LYMPH, POC: 2.3 (ref 0.6–3.4)
MCH, POC: 31.2 pg (ref 27–31.2)
MCHC: 35 g/dL (ref 31.8–35.4)
MCV: 89.2 fL (ref 80–97)
MID (cbc): 0.4 (ref 0–0.9)
MPV: 6.9 fL (ref 0–99.8)
POC GRANULOCYTE: 4.5 (ref 2–6.9)
POC LYMPH %: 31.9 % (ref 10–50)
POC MID %: 5.3 %M (ref 0–12)
Platelet Count, POC: 258 10*3/uL (ref 142–424)
RBC: 4.75 M/uL (ref 4.69–6.13)
RDW, POC: 13.2 %
WBC: 7.2 10*3/uL (ref 4.6–10.2)

## 2016-08-10 LAB — POCT URINALYSIS DIP (MANUAL ENTRY)
BILIRUBIN UA: NEGATIVE
Blood, UA: NEGATIVE
Glucose, UA: NEGATIVE
Ketones, POC UA: NEGATIVE
LEUKOCYTES UA: NEGATIVE
NITRITE UA: NEGATIVE
PH UA: 6.5 (ref 5.0–8.0)
Protein Ur, POC: NEGATIVE
Spec Grav, UA: 1.025 (ref 1.030–1.035)
Urobilinogen, UA: 1 (ref ?–2.0)

## 2016-08-10 LAB — POC MICROSCOPIC URINALYSIS (UMFC): Mucus: ABSENT

## 2016-08-10 MED ORDER — IPRATROPIUM BROMIDE HFA 17 MCG/ACT IN AERS: 2.0000 | INHALATION_SPRAY | RESPIRATORY_TRACT | 0 refills | Status: DC | PRN

## 2016-08-10 MED ORDER — ALBUTEROL SULFATE (2.5 MG/3ML) 0.083% IN NEBU
2.5000 mg | INHALATION_SOLUTION | Freq: Once | RESPIRATORY_TRACT | Status: AC
  Administered 2016-08-10: 2.5 mg via RESPIRATORY_TRACT

## 2016-08-10 MED ORDER — IPRATROPIUM BROMIDE 0.02 % IN SOLN
0.5000 mg | Freq: Once | RESPIRATORY_TRACT | Status: AC
  Administered 2016-08-10: 0.5 mg via RESPIRATORY_TRACT

## 2016-08-10 MED ORDER — PREDNISONE 20 MG PO TABS: 40.0000 mg | ORAL_TABLET | Freq: Every day | ORAL | 0 refills | Status: AC

## 2016-08-10 NOTE — Progress Notes (Signed)
MRN: [id number] DOB: February 26, 1946  Subjective:   Todd Harrison is a 71 y.o. male presenting for follow up on cough.   Initially seen on 08/03/16 for sinus congestion and cough x 1 week. He had wheezes on PE. CXR negative. Was given one dose of depomedrol, a breathing treatment, and Rx for doxycycline. States he felt really good the night after his visit and the next day but then the coughing and wheezing returned.  Nothing has worsened but they have not gotten better. He is also having a little SOB. His sinus pressure has completely resolved. Denies fever, chills, chest pain, and heart palpitations. He is on day 7 of doxycycline. He is also taking robitussin DM and albuterol inhaler PRN.  Pt also noticed that he developed a small rash on his right wrist yesterday. Denies itching, pain, redness, and warmth.   Lash has a current medication list which includes the following prescription(s): doxycycline, levothyroxine, omeprazole, fluticasone, fluticasone propionate hfa, ipratropium, and prednisone, and the following Facility-Administered Medications: methylprednisolone acetate. Also has No Known Allergies.  Asmar  has a past medical history of Thyroid disease. Also  has no past surgical history on file.   Objective:   Vitals: BP 134/80 (Cuff Size: Large)   Pulse 71   Temp 97.5 F (36.4 C) (Oral)   Resp 18   Wt (!) 301 lb 6.4 oz (136.7 kg)   SpO2 94%   BMI 40.88 kg/m   Physical Exam  Constitutional: He is oriented to person, place, and time. He appears well-developed and well-nourished. No distress.  HENT:  Head: Normocephalic and atraumatic.  Right Ear: Tympanic membrane, external ear and ear canal normal.  Left Ear: Tympanic membrane, external ear and ear canal normal.  Nose: Right sinus exhibits maxillary sinus tenderness (mild). Left sinus exhibits maxillary sinus tenderness (mild).  Mouth/Throat: Uvula is midline and mucous membranes are normal. Posterior oropharyngeal  erythema present. Tonsils are 0 on the right. Tonsils are 0 on the left.  Eyes: Conjunctivae are normal.  Neck: Normal range of motion.  Cardiovascular: Normal rate, regular rhythm, normal heart sounds and intact distal pulses.   Pulmonary/Chest: Effort normal. He has wheezes (intermittment, more prominent on right lung field). He has rhonchi (diffuse, cleared with cough, more prominent on right lung field).  Lymphadenopathy:       Head (right side): Submandibular adenopathy present. No submental, no tonsillar, no preauricular, no posterior auricular and no occipital adenopathy present.       Head (left side): Submandibular adenopathy present. No submental, no tonsillar, no preauricular, no posterior auricular and no occipital adenopathy present.    He has no cervical adenopathy.       Right: No supraclavicular adenopathy present.       Left: No supraclavicular adenopathy present.  Neurological: He is alert and oriented to person, place, and time.  Skin: Skin is warm and dry.  Small area of nonblanchable petechial rash noted on right anterior wrist.   Psychiatric: He has a normal mood and affect.  Vitals reviewed.   Results for orders placed or performed in visit on 08/10/16 (from the past 24 hour(s))  POCT CBC     Status: Abnormal   Collection Time: 08/10/16 11:23 AM  Result Value Ref Range   WBC 7.2 4.6 - 10.2 K/uL   Lymph, poc 2.3 0.6 - 3.4   POC LYMPH PERCENT 31.9 10 - 50 %L   MID (cbc) 0.4 0 - 0.9   POC MID % 5.3  0 - 12 %M   POC Granulocyte 4.5 2 - 6.9   Granulocyte percent 62.8 37 - 80 %G   RBC 4.75 4.69 - 6.13 M/uL   Hemoglobin 14.8 14.1 - 18.1 g/dL   HCT, POC 42.3 (A) 43.5 - 53.7 %   MCV 89.2 80 - 97 fL   MCH, POC 31.2 27 - 31.2 pg   MCHC 35.0 31.8 - 35.4 g/dL   RDW, POC 13.2 %   Platelet Count, POC 258 142 - 424 K/uL   MPV 6.9 0 - 99.8 fL  POCT urinalysis dipstick     Status: None   Collection Time: 08/10/16 12:07 PM  Result Value Ref Range   Color, UA yellow yellow     Clarity, UA clear clear   Glucose, UA negative negative   Bilirubin, UA negative negative   Ketones, POC UA negative negative   Spec Grav, UA 1.025 1.030 - 1.035   Blood, UA negative negative   pH, UA 6.5 5.0 - 8.0   Protein Ur, POC negative negative   Urobilinogen, UA 1.0 Negative - 2.0   Nitrite, UA Negative Negative   Leukocytes, UA Negative Negative  POCT Microscopic Urinalysis ([facility])     Status: Abnormal   Collection Time: 08/10/16 12:16 PM  Result Value Ref Range   WBC,UR,HPF,POC None None WBC/hpf   RBC,UR,HPF,POC None None RBC/hpf   Bacteria None None, Too numerous to count   Mucus Absent Absent   Epithelial Cells, UR Per Microscopy Few (A) None, Too numerous to count cells/hpf   Post duoneb, pt reports feeling better. His wheezing has improved.  Assessment and Plan :  1. Wheezing Will attempt short course prednisone. Given Rx for atrovent inhaler to use along with albuterol inhaler every 4-6 hours PRN.  - POCT CBC - albuterol (PROVENTIL) (2.5 MG/3ML) 0.083% nebulizer solution 2.5 mg; Take 3 mLs (2.5 mg total) by nebulization once. - ipratropium (ATROVENT) nebulizer solution 0.5 mg; Take 2.5 mLs (0.5 mg total) by nebulization once. - predniSONE (DELTASONE) 20 MG tablet; Take 2 tablets (40 mg total) by mouth daily with breakfast.  Dispense: 10 tablet; Refill: 0 - ipratropium (ATROVENT HFA) 17 MCG/ACT inhaler; Inhale 2 puffs into the lungs every 4 (four) hours as needed for wheezing.  Dispense: 1 Inhaler; Refill: 0 2. Cough -Instructed to complete antibiotic as prescribed.  3. Rash and nonspecific skin eruption Labs pending, instructed to d/c prednisone if it leads to worsening rash.  - CMP14+EGFR - POCT urinalysis dipstick - POCT Microscopic Urinalysis ([facility])  Tenna Delaine, PA-C  [facility] 08/10/2016 7:58 PM

## 2016-08-10 NOTE — Patient Instructions (Addendum)
Your point of care lab results are reassuring today, I will have your other lab results in a couple of days.   For your cough and wheezing, I want you to continue doxy until it is complete. Also I would like you to take 40mg  prednisone for the next five days. I have given you an atrovent inhaler and you can use this along with your albuterol inhaler every 4-6 hours as needed. If your rash gets worse on prednisone, please stop it and call our office. Otherwise, return to clinic if symptoms worsen, do not improve in 5 days, or as needed     Acute Bronchitis, Adult Acute bronchitis is when air tubes (bronchi) in the lungs suddenly get swollen. The condition can make it hard to breathe. It can also cause these symptoms:  A cough.  Coughing up clear, yellow, or green mucus.  Wheezing.  Chest congestion.  Shortness of breath.  A fever.  Body aches.  Chills.  A sore throat. Follow these instructions at home: Medicines   Take over-the-counter and prescription medicines only as told by your doctor.  If you were prescribed an antibiotic medicine, take it as told by your doctor. Do not stop taking the antibiotic even if you start to feel better. General instructions   Rest.  Drink enough fluids to keep your pee (urine) clear or pale yellow.  Avoid smoking and secondhand smoke. If you smoke and you need help quitting, ask your doctor. Quitting will help your lungs heal faster.  Use an inhaler, cool mist vaporizer, or humidifier as told by your doctor.  Keep all follow-up visits as told by your doctor. This is important. How is this prevented? To lower your risk of getting this condition again:  Wash your hands often with soap and water. If you cannot use soap and water, use hand sanitizer.  Avoid contact with people who have cold symptoms.  Try not to touch your hands to your mouth, nose, or eyes.  Make sure to get the flu shot every year. Contact a doctor if:  Your  symptoms do not get better in 2 weeks. Get help right away if:  You cough up blood.  You have chest pain.  You have very bad shortness of breath.  You become dehydrated.  You faint (pass out) or keep feeling like you are going to pass out.  You keep throwing up (vomiting).  You have a very bad headache.  Your fever or chills gets worse. This information is not intended to replace advice given to you by your health care provider. Make sure you discuss any questions you have with your health care provider. Document Released: 10/11/2007 Document Revised: 12/01/2015 Document Reviewed: 10/13/2015 Elsevier Interactive Patient Education  2017 [employer].   IF you received an x-ray today, you will receive an invoice from [facility]. Please contact [facility] at [phone] with questions or concerns regarding your invoice.   IF you received labwork today, you will receive an invoice from [facility]. Please contact [facility] at [phone] with questions or concerns regarding your invoice.   Our billing staff will not be able to assist you with questions regarding bills from these companies.  You will be contacted with the lab results as soon as they are available. The fastest way to get your results is to activate your My Chart account. Instructions are located on the last page of this paperwork. If you have not heard from [place] regarding the results in 2 weeks, please  contact this office.

## 2016-09-15 ENCOUNTER — Ambulatory Visit: Payer: BLUE CROSS/BLUE SHIELD | Admitting: Physician Assistant

## 2016-09-19 ENCOUNTER — Other Ambulatory Visit: Payer: Self-pay | Admitting: Internal Medicine

## 2016-09-19 DIAGNOSIS — N3091 Cystitis, unspecified with hematuria: Secondary | ICD-10-CM

## 2018-01-14 ENCOUNTER — Ambulatory Visit: Payer: BLUE CROSS/BLUE SHIELD | Admitting: Physician Assistant

## 2018-09-03 ENCOUNTER — Other Ambulatory Visit: Payer: Self-pay

## 2018-09-03 ENCOUNTER — Encounter: Payer: Self-pay | Admitting: Allergy and Immunology

## 2018-09-03 ENCOUNTER — Ambulatory Visit: Payer: Medicare HMO | Admitting: Allergy and Immunology

## 2018-09-03 DIAGNOSIS — J3089 Other allergic rhinitis: Secondary | ICD-10-CM

## 2018-09-03 DIAGNOSIS — L301 Dyshidrosis [pompholyx]: Secondary | ICD-10-CM

## 2018-09-03 DIAGNOSIS — L989 Disorder of the skin and subcutaneous tissue, unspecified: Secondary | ICD-10-CM

## 2018-09-03 DIAGNOSIS — L308 Other specified dermatitis: Secondary | ICD-10-CM

## 2018-09-03 DIAGNOSIS — L230 Allergic contact dermatitis due to metals: Secondary | ICD-10-CM | POA: Diagnosis not present

## 2018-09-03 DIAGNOSIS — J4520 Mild intermittent asthma, uncomplicated: Secondary | ICD-10-CM

## 2018-09-03 MED ORDER — FLUTICASONE PROPIONATE 50 MCG/ACT NA SUSP: 2.0000 | Freq: Every day | NASAL | 5 refills | Status: DC

## 2018-09-03 MED ORDER — ALBUTEROL SULFATE HFA 108 (90 BASE) MCG/ACT IN AERS: 2.0000 | INHALATION_SPRAY | Freq: Four times a day (QID) | RESPIRATORY_TRACT | 2 refills | Status: DC | PRN

## 2018-09-03 MED ORDER — FLUTICASONE PROPIONATE HFA 110 MCG/ACT IN AERO: INHALATION_SPRAY | RESPIRATORY_TRACT | 3 refills | Status: DC

## 2018-09-03 MED ORDER — TACROLIMUS 0.1 % EX OINT: TOPICAL_OINTMENT | Freq: Two times a day (BID) | CUTANEOUS | 0 refills | Status: DC

## 2018-09-03 NOTE — Progress Notes (Signed)
[facility]   NEW PATIENT NOTE  Referring Provider: No ref. provider found Primary Provider: Lorene Dy, MD Date of office visit: 09/03/2018    Subjective:   Chief Complaint:  Todd Harrison (DOB: [birth year]) is a 73 y.o. male who presents to the clinic on 09/03/2018 with a chief complaint of Eczema (hands and feet 30+ years bilateral ) and Urticaria (Lichen Planus ) .  HPI: Todd Harrison presents to this clinic in evaluation of dermatitis.  He has developed dyshidrotic eczema involving palms and feet for about 30 years or so that was under pretty good control with the use of halobetasol cream.  About 1 and half years ago he started to develop a more extensive dermatitis involving other areas of his body, went to see a dermatologist and he was biopsied and told that he has lichen planus as well as dyshidrotic eczema. Around December 2019 his dyshidrotic eczema exploded to the point where he could not even walk.  He was started on methotrexate and apparently topical triamcinolone and nothing really helped this issue and subsequently he had ankle and feet swelling and was diagnosed with a cellulitis and treated with an antibiotic.  He never really improved until he made 1 significant environmental change about 1 month ago.  At that point in time he eliminated the use of his metal frame glasses and started plastic glasses and within 24 hours or so he noticed much less itching of the skin and over the course of this past month he has improved significantly.  However, he still has significant problems with his feet even though his palms have cleared up.  He does not really note any obvious provoking factor giving rise to this issue.  He has had a history of nickel allergy in the distant past and he is pretty cognizant about avoiding nickel.  He does play the base guitar and he changed his strings about 1 year ago but he is not sure if the string still have  metal.  Other atopic symptomatology includes a long history of sinus problems that he treats with saline and antihistamine and intermittent nasal steroid successfully.  He has apparently had some intermittent issues with wheezing especially whenever he develops a head cold.  He has had physician documented wheezing in 2018 and was given therapy for that issue but he does not really use any therapy on a consistent basis.  He did smoke tobacco for about 10 to 12 years which he discontinued in 1982.  He has been given Flovent and bronchodilator which he does not use unless he is sick.  Past Medical History:  Diagnosis Date   Lichen planus    Thyroid disease     Past Surgical History:  Procedure Laterality Date   CATARACT EXTRACTION Left    2019    Allergies as of 09/03/2018   No Known Allergies     Medication List      augmented betamethasone dipropionate 0.05 % cream Commonly known as:  DIPROLENE-AF APPLY TO AFFECTED AREA EVERY DAY   clobetasol ointment 0.05 % Commonly known as:  TEMOVATE AFFECTED EAR TWICE A DAY **NOT FOR FACE OR SKIN FOLD AREA** USE FOR 15 DAYS   doxycycline 100 MG capsule Commonly known as:  VIBRAMYCIN Take 1 capsule (100 mg total) by mouth 2 (two) times daily.   fluticasone 50 MCG/ACT nasal spray Commonly known as:  FLONASE Place into the nose.   FLUTICASONE PROPIONATE  HFA IN Inhale into the lungs.   folic acid 1 MG tablet Commonly known as:  FOLVITE Take 1 mg by mouth daily.   ipratropium 17 MCG/ACT inhaler Commonly known as:  Atrovent HFA Inhale 2 puffs into the lungs every 4 (four) hours as needed for wheezing.   levothyroxine 100 MCG tablet Commonly known as:  SYNTHROID Take 100 mcg by mouth daily.   methotrexate 2.5 MG tablet Commonly known as:  RHEUMATREX   triamcinolone cream 0.1 % Commonly known as:  KENALOG   urea 40 % Crea Commonly known as:  CARMOL APPLY 1 APPLICATION AS NEEDED ONCE A DAY EXTERNALLY       Review of  systems negative except as noted in HPI / PMHx or noted below:  Review of Systems  Constitutional: Negative.   HENT: Negative.   Eyes: Negative.   Respiratory: Negative.   Cardiovascular: Negative.   Gastrointestinal: Negative.   Genitourinary: Negative.   Musculoskeletal: Negative.   Skin: Negative.   Neurological: Negative.   Endo/Heme/Allergies: Negative.   Psychiatric/Behavioral: Negative.     Family History  Problem Relation Age of Onset   Diabetes Mother    Stroke Mother     Social History   Socioeconomic History   Marital status: Married    Spouse name: Not on file   Number of children: Not on file   Years of education: Not on file   Highest education level: Not on file  Occupational History   Not on file  Social Needs   Financial resource strain: Not on file   Food insecurity:    Worry: Not on file    Inability: Not on file   Transportation needs:    Medical: Not on file    Non-medical: Not on file  Tobacco Use   Smoking status: Former Smoker   Smokeless tobacco: Never Used  Substance and Sexual Activity   Alcohol use: Yes    Alcohol/week: 1.0 standard drinks    Types: 1 Glasses of wine per week   Drug use: No   Sexual activity: Not on file  Lifestyle   Physical activity:    Days per week: Not on file    Minutes per session: Not on file   Stress: Not on file  Relationships   Social connections:    Talks on phone: Not on file    Gets together: Not on file    Attends religious service: Not on file    Active member of club or organization: Not on file    Attends meetings of clubs or organizations: Not on file    Relationship status: Not on file   Intimate partner violence:    Fear of current or ex partner: Not on file    Emotionally abused: Not on file    Physically abused: Not on file    Forced sexual activity: Not on file  Other Topics Concern   Not on file  Social History Narrative   Not on file    Environmental  and Social history  Lives in a house with a dry environment, cats located inside the household, carpet in the bedroom, no plastic on the bed, no plastic on the pillow, no smokers look inside the household.  Objective:  There were no vitals filed for this visit.      Physical Exam Constitutional:      Appearance: He is not diaphoretic.  HENT:     Head: Normocephalic.     Right Ear: Tympanic membrane, ear  canal and external ear normal.     Left Ear: Tympanic membrane, ear canal and external ear normal.     Nose: Nose normal. No mucosal edema or rhinorrhea.     Mouth/Throat:     Pharynx: Uvula midline. No oropharyngeal exudate.  Eyes:     Conjunctiva/sclera: Conjunctivae normal.  Neck:     Thyroid: No thyromegaly.     Trachea: Trachea normal. No tracheal tenderness or tracheal deviation.  Cardiovascular:     Rate and Rhythm: Normal rate and regular rhythm.     Heart sounds: Normal heart sounds, S1 normal and S2 normal. No murmur.  Pulmonary:     Effort: No respiratory distress.     Breath sounds: Normal breath sounds. No stridor. No wheezing or rales.  Lymphadenopathy:     Head:     Right side of head: No tonsillar adenopathy.     Left side of head: No tonsillar adenopathy.     Cervical: No cervical adenopathy.  Skin:    Findings: Rash (Extensive scale involving feet with surrounding erythema.  Multiple erythematous blanching lesions across shin and arms.) present. No erythema.     Nails: There is no clubbing.   Neurological:     Mental Status: He is alert.     Diagnostics: Allergy skin tests were performed.  He demonstrated hypersensitivity to grass without any hypersensitivity directed against a screening panel of foods.  Spirometry was performed and demonstrated an FEV1 of 3.91 @ 111 % of predicted. FEV1/FVC = 0.83  Assessment and Plan:    1. Inflammatory dermatosis   2. Dyshidrotic eczema   3. Allergic contact dermatitis due to metals   4. Perennial allergic  rhinitis   5. Asthma, mild intermittent, well-controlled     1.  Avoidance measures  2.  Add Protopic ointment in addition to topical steroid cream to feet daily  3.  Can continue Flonase 1-2 sprays each nostril 1 time per day during periods of upper airway symptoms and continue nasal saline and antihistamine as needed  4.  Can continue a "action plan" for wheezing associated with colds including the following:   A.  Flovent 110-3 inhalations 3 times a day (9)  B.  Albuterol HFA 2 inhalations every 4-6 hours if needed  5.  Arrange for patch testing directed against metals  6.  Further evaluation and treatment?  7.  Return to clinic in 4 weeks or earlier if problem  Todd Harrison has some form of hyperkeratotic dyshidrotic eczema that has really increased in intensity over the course of the past 6 months or so and has been less than responsive to the administration of methotrexate and topical anti-inflammatory agents including high potency topical steroids.  I will add in LA calcineurin inhibitor to his anti-inflammatory medical regime as noted above.  We will further investigate his possible environmental hypersensitivity by having him perform patch testing directed against metals as he did note some improvement regarding his inflammatory condition when he made an attempt to avoid metal exposure in the past.  He will remain on anti-inflammatory agents for his airway with the use of a nasal steroid and I have given him a "action plan" to be utilized which includes an high-dose inhaled steroid should he develop a flare of his asthma in the future.  I will see him back in this clinic in 4 weeks or earlier if there is a problem.  Allena Katz, MD Allergy / Immunology [place]

## 2018-09-03 NOTE — Patient Instructions (Addendum)
  1.  Avoidance measures  2.  Add Protopic ointment in addition to topical steroid cream to feet daily  3.  Can continue Flonase 1-2 sprays each nostril 1 time per day during periods of upper airway symptoms and continue nasal saline and antihistamine as needed  4.  Can continue a "action plan" for wheezing associated with colds including the following:   A.  Flovent 110-3 inhalations 3 times a day (9)  B.  Albuterol HFA 2 inhalations every 4-6 hours if needed  5.  Arrange for patch testing directed against metals  6.  Further evaluation and treatment?  7.  Return to clinic in 4 weeks or earlier if problem

## 2018-09-04 ENCOUNTER — Encounter: Payer: Self-pay | Admitting: Allergy and Immunology

## 2018-09-09 ENCOUNTER — Ambulatory Visit (INDEPENDENT_AMBULATORY_CARE_PROVIDER_SITE_OTHER): Payer: Medicare HMO | Admitting: Allergy

## 2018-09-09 ENCOUNTER — Encounter: Payer: Self-pay | Admitting: Allergy

## 2018-09-09 ENCOUNTER — Other Ambulatory Visit: Payer: Self-pay

## 2018-09-09 VITALS — BP 128/70 | HR 80 | Resp 16

## 2018-09-09 DIAGNOSIS — L230 Allergic contact dermatitis due to metals: Secondary | ICD-10-CM | POA: Diagnosis not present

## 2018-09-09 DIAGNOSIS — L239 Allergic contact dermatitis, unspecified cause: Secondary | ICD-10-CM | POA: Insufficient documentation

## 2018-09-09 NOTE — Assessment & Plan Note (Signed)
TRUE and metal patches placed today.  Continue proper skin care measures.

## 2018-09-09 NOTE — Progress Notes (Signed)
   Follow Up Note  RE: Todd Harrison MRN: [id number] DOB: 1945/11/01 Date of Office Visit: 09/09/2018  Referring provider: Lorene Dy, MD Primary care provider: Lorene Dy, MD  History of Present Illness: I had the pleasure of seeing Todd Harrison for a follow up visit at the [facility] on 09/09/2018. He is a 73 y.o. male, who is being followed for dermatitis. Today he is here for patch test placement, given suspected history of contact dermatitis.   Diagnostics: TRUE Test and metal patches placed.   Assessment and Plan: Todd Harrison is a 73 y.o. male with: Allergic contact dermatitis TRUE and metal patches placed today.  Continue proper skin care measures.   The patient was instructed regarding proper care of the patches for the next 48 hours. Do not get patches wet - avoid showering until the next visit. Do not engage in vigorous physical activity.  Patient will follow up in 48 hours and 96 hours for patch readings.  It was my pleasure to see Todd Harrison today and participate in his care. Please feel free to contact me with any questions or concerns.  Sincerely,  Rexene Alberts, DO Allergy & Immunology  [facility] office: [phone]

## 2018-09-11 ENCOUNTER — Ambulatory Visit: Payer: Medicare HMO | Admitting: Allergy

## 2018-09-11 ENCOUNTER — Other Ambulatory Visit: Payer: Self-pay

## 2018-09-11 DIAGNOSIS — L2389 Allergic contact dermatitis due to other agents: Secondary | ICD-10-CM

## 2018-09-11 NOTE — Progress Notes (Signed)
    Follow-up Note  RE: Todd Harrison MRN: [id number] DOB: October 17, 1945 Date of Office Visit: 09/11/2018  Primary care provider: Lorene Dy, MD Referring provider: Lorene Dy, MD   [name] returns to the office today for the initial patch test interpretation, given suspected history of contact dermatitis.    Diagnostics:  TRUE TEST 48 hour reading:  #28 gold weak positive Metal panel  48 hour reading: negative.   Plan:  Allergic contact dermatitis  The patient has been provided detailed information regarding the substances he is sensitive to, as well as products containing the substances.  Meticulous avoidance of these substances is recommended. If avoidance is not possible, the use of barrier creams or lotions is recommended.  Return in 2 days for final reading.

## 2018-09-13 ENCOUNTER — Encounter: Payer: Self-pay | Admitting: Family Medicine

## 2018-09-13 ENCOUNTER — Ambulatory Visit (INDEPENDENT_AMBULATORY_CARE_PROVIDER_SITE_OTHER): Payer: Medicare HMO | Admitting: Family Medicine

## 2018-09-13 ENCOUNTER — Other Ambulatory Visit: Payer: Self-pay

## 2018-09-13 DIAGNOSIS — L2389 Allergic contact dermatitis due to other agents: Secondary | ICD-10-CM

## 2018-09-13 NOTE — Patient Instructions (Addendum)
    Follow-up Note  RE: Todd Harrison MRN: [id number] DOB: February 23, 1946 Date of Office Visit: @[facility] @  Primary care provider: Lorene Dy, MD Referring provider: Lorene Dy, MD   Sair returns to the office today for the final patch test interpretation, given suspected history of contact dermatitis.    Diagnostics:   TRUE TEST 96-hour hour reading: positive reaction to #28 (Gold sodium thiosulfate)  Metals test: 96 hour reading: negative  Plan:   Allergic contact dermatitis - The patient has been provided detailed information regarding the substances she is sensitive to, as well as products containing the substances.   - Meticulous avoidance of these substances is recommended.  - If avoidance is not possible, the use of barrier creams or lotions is recommended. Continue your current skin care regimen including Protopic ointment in addition to topical steroids and methotrexate as directed by your dermatologist - If symptoms persist or progress despite meticulous avoidance of gold sodium thiosulfate, Dermatology Referral may be warranted.  Follow up in 3 months or sooner if needed  Thank you for the opportunity to care for this patient.  Please do not hesitate to contact me with questions.  Gareth Morgan, FNP [facility]

## 2018-09-13 NOTE — Progress Notes (Signed)
    Follow-up Note  RE: Todd Harrison MRN: [id number] DOB: 14-Feb-1946 Date of Office Visit: 09/13/2018  Primary care provider: Lorene Dy, MD Referring provider: Lorene Dy, MD   Nichalos returns to the office today for the final patch test interpretation, given suspected history of contact dermatitis.    Diagnostics:   TRUE TEST 96-hour hour reading: positive 3+ reaction to #28 (Gold sodium thiosulfate)   Metals panel 96 hour reading: negative  Plan:   Allergic contact dermatitis - The patient has been provided detailed information regarding the substances she is sensitive to, as well as products containing the substances.   - Meticulous avoidance of these substances is recommended.  - If avoidance is not possible, the use of barrier creams or lotions is recommended. Continue your current skin care regimen including Protopic ointment in addition to topical steroids and methotrexate as directed by your dermatologist - If symptoms persist or progress despite meticulous avoidance of gold sodium thiosulfate, Dermatology Referral may be warranted.  Follow up in 3 months or sooner if needed  Thank you for the opportunity to care for this patient.  Please do not hesitate to contact me with questions.  Gareth Morgan, FNP [facility]

## 2019-03-19 ENCOUNTER — Telehealth (INDEPENDENT_AMBULATORY_CARE_PROVIDER_SITE_OTHER): Payer: Medicare HMO | Admitting: Registered Nurse

## 2019-03-19 DIAGNOSIS — J069 Acute upper respiratory infection, unspecified: Secondary | ICD-10-CM

## 2019-03-19 DIAGNOSIS — B9689 Other specified bacterial agents as the cause of diseases classified elsewhere: Secondary | ICD-10-CM

## 2019-03-19 MED ORDER — BENZONATATE 100 MG PO CAPS: 100.0000 mg | ORAL_CAPSULE | Freq: Two times a day (BID) | ORAL | 0 refills | Status: DC | PRN

## 2019-03-19 MED ORDER — AZITHROMYCIN 250 MG PO TABS: ORAL_TABLET | ORAL | 0 refills | Status: DC

## 2019-03-19 NOTE — Progress Notes (Signed)
Pt states x 1 week he has  been having some nasal drainage,chest congestions,and cough. He states he noticed his mucus is green when he cough it up. He states there is no shob or chest pain at this time. He states the drainage feels like it is getting in his chest.

## 2019-03-19 NOTE — Progress Notes (Signed)
Telemedicine Encounter- SOAP NOTE Established Patient  This telephone encounter was conducted with the patient's (or proxy's) verbal consent via audio telecommunications: yes  Patient was instructed to have this encounter in a suitably private space; and to only have persons present to whom they give permission to participate. In addition, patient identity was confirmed by use of name plus two identifiers (DOB and address).  I discussed the limitations, risks, security and privacy concerns of performing an evaluation and management service by telephone and the availability of in person appointments. I also discussed with the patient that there may be a patient responsible charge related to this service. The patient expressed understanding and agreed to proceed.  I spent a total of 11 minutes talking with the patient or their proxy.  No chief complaint on file.   Subjective   Todd Harrison is a 73 y.o. established patient. Telephone visit today for URI symptoms. Notes that his grandson and daughter have had a cough and some congestion and his daughter has since tested negative for COVID. He notes that he has had nasal congestion, some sinus pressure, PND leading to cough, and now chest congestion with green sputum in a productive cough. These symptoms started around 7 days ago and have worsened. He states overall he feels fine with no fever, fatigue, shob, doe, dependent edema, headache, sensory changes.  HPI   Patient Active Problem List   Diagnosis Date Noted  . Allergic contact dermatitis 09/09/2018  . Acquired hypothyroidism 06/09/2016  . Rotator cuff tear, right 09/22/2010  . METATARSALGIA 11/24/2008  . FOOT PAIN, LEFT 11/24/2008  . TALIPES CAVUS 11/24/2008    Past Medical History:  Diagnosis Date  . Lichen planus   . Thyroid disease     Current Outpatient Medications  Medication Sig Dispense Refill  . acitretin (SORIATANE) 25 MG capsule Take 25 mg by mouth daily.     Marland Kitchen albuterol (VENTOLIN HFA) 108 (90 Base) MCG/ACT inhaler Inhale 2 puffs into the lungs every 6 (six) hours as needed for wheezing or shortness of breath. 1 Inhaler 2  . clobetasol ointment (TEMOVATE) 0.05 % AFFECTED EAR TWICE A DAY **NOT FOR FACE OR SKIN FOLD AREA** USE FOR 15 DAYS    . fluticasone (FLOVENT HFA) 110 MCG/ACT inhaler 3 inhalations 3 times a day 1 Inhaler 3  . folic acid (FOLVITE) 1 MG tablet Take 1 mg by mouth daily.    Marland Kitchen ipratropium (ATROVENT HFA) 17 MCG/ACT inhaler Inhale 2 puffs into the lungs every 4 (four) hours as needed for wheezing. 1 Inhaler 0  . levothyroxine (SYNTHROID, LEVOTHROID) 100 MCG tablet Take 100 mcg by mouth daily.    . methotrexate (RHEUMATREX) 2.5 MG tablet     . tacrolimus (PROTOPIC) 0.1 % ointment Apply topically 2 (two) times daily. 100 g 0  . triamcinolone cream (KENALOG) 0.1 %     . urea (CARMOL) 40 % CREA APPLY 1 APPLICATION AS NEEDED ONCE A DAY EXTERNALLY     Current Facility-Administered Medications  Medication Dose Route Frequency Provider Last Rate Last Dose  . methylPREDNISolone acetate (DEPO-MEDROL) injection 40 mg  40 mg Intra-articular Once Claris Gower, MD        No Known Allergies  Social History   Socioeconomic History  . Marital status: Married    Spouse name: Not on file  . Number of children: Not on file  . Years of education: Not on file  . Highest education level: Not on file  Occupational History  .  Not on file  Social Needs  . Financial resource strain: Not on file  . Food insecurity    Worry: Not on file    Inability: Not on file  . Transportation needs    Medical: Not on file    Non-medical: Not on file  Tobacco Use  . Smoking status: Former Research scientist (life sciences)  . Smokeless tobacco: Never Used  Substance and Sexual Activity  . Alcohol use: Yes    Alcohol/week: 1.0 standard drinks    Types: 1 Glasses of wine per week  . Drug use: No  . Sexual activity: Not on file  Lifestyle  . Physical activity    Days per week:  Not on file    Minutes per session: Not on file  . Stress: Not on file  Relationships  . Social Herbalist on phone: Not on file    Gets together: Not on file    Attends religious service: Not on file    Active member of club or organization: Not on file    Attends meetings of clubs or organizations: Not on file    Relationship status: Not on file  . Intimate partner violence    Fear of current or ex partner: Not on file    Emotionally abused: Not on file    Physically abused: Not on file    Forced sexual activity: Not on file  Other Topics Concern  . Not on file  Social History Narrative  . Not on file    ROS Per hpi  Objective   Vitals as reported by the patient: There were no vitals filed for this visit.  Diagnoses and all orders for this visit:  Bacterial URI -     azithromycin (ZITHROMAX) 250 MG tablet; Take 2 tablets on first day. Then take 1 tablet daily. Finish entire supply -     benzonatate (TESSALON) 100 MG capsule; Take 1 capsule (100 mg total) by mouth 2 (two) times daily as needed for cough.   PLAN  Likely bacterial URI based on symptoms  Z pack and tessalon for treatment - he already has fluticasone on hand, suggest he use that as well  Finish supply of z pack even if feeling better. If symptoms change, worsen, or don't improve, call back in 5-7 days  He has been on keflex x 5 mos for psoriasis in feet. He is planning to stop this prior to starting azithromycin. We discussed that long term use of an antibiotic is a risk factor for antibiotic resistance, though azithromycin will have better effect on the respiratory system.  Patient encouraged to call clinic with any questions, comments, or concerns.    I discussed the assessment and treatment plan with the patient. The patient was provided an opportunity to ask questions and all were answered. The patient agreed with the plan and demonstrated an understanding of the instructions.   The  patient was advised to call back or seek an in-person evaluation if the symptoms worsen or if the condition fails to improve as anticipated.  I provided 11 minutes of non-face-to-face time during this encounter.  Maximiano Coss, NP  Primary Care at [facility]

## 2019-03-31 ENCOUNTER — Telehealth (INDEPENDENT_AMBULATORY_CARE_PROVIDER_SITE_OTHER): Payer: Medicare HMO | Admitting: Registered Nurse

## 2019-03-31 ENCOUNTER — Other Ambulatory Visit: Payer: Self-pay

## 2019-03-31 DIAGNOSIS — R05 Cough: Secondary | ICD-10-CM

## 2019-03-31 DIAGNOSIS — R059 Cough, unspecified: Secondary | ICD-10-CM

## 2019-03-31 MED ORDER — DEXTROMETHORPHAN HBR 15 MG/5ML PO SYRP: 10.0000 mL | ORAL_SOLUTION | Freq: Four times a day (QID) | ORAL | 0 refills | Status: DC | PRN

## 2019-03-31 MED ORDER — PROMETHAZINE-CODEINE 6.25-10 MG/5ML PO SOLN: 5.0000 mL | Freq: Every evening | ORAL | 0 refills | Status: DC | PRN

## 2019-03-31 MED ORDER — ALBUTEROL SULFATE HFA 108 (90 BASE) MCG/ACT IN AERS: 2.0000 | INHALATION_SPRAY | Freq: Four times a day (QID) | RESPIRATORY_TRACT | 3 refills | Status: DC | PRN

## 2019-03-31 NOTE — Addendum Note (Signed)
Addended by: Maximiano Coss on: 03/31/2019 08:55 PM   Modules accepted: Orders

## 2019-03-31 NOTE — Progress Notes (Signed)
Telemedicine Encounter- SOAP NOTE Established Patient  This telephone encounter was conducted with the patient's (or proxy's) verbal consent via audio telecommunications: yes  Patient was instructed to have this encounter in a suitably private space; and to only have persons present to whom they give permission to participate. In addition, patient identity was confirmed by use of name plus two identifiers (DOB and address).  I discussed the limitations, risks, security and privacy concerns of performing an evaluation and management service by telephone and the availability of in person appointments. I also discussed with the patient that there may be a patient responsible charge related to this service. The patient expressed understanding and agreed to proceed.  I spent a total of 11 minutes talking with the patient or their proxy.  Chief Complaint  Patient presents with  . Cough    Pt stated--- coughing/ nasal yellow/white drainage. Tried to take excederine pm.    Subjective   Todd Harrison is a 73 y.o. established patient. Telephone visit today for ongoing respiratory infection symptoms.  HPI These symptoms onset over two weeks ago. We had a visit during which I recommended a z pack and supportive therapy including fluticasone and albuterol, as well as tessalon. He reports that these were only mildly effective, and unfortunately his cough has progressed. No hemoptysis, minimal shob, no headache. Has had fatigue because his cough is keeping him awake. Has not been tested for COVID. No known sick contacts. No NVD  Patient Active Problem List   Diagnosis Date Noted  . Allergic contact dermatitis 09/09/2018  . Acquired hypothyroidism 06/09/2016  . Rotator cuff tear, right 09/22/2010  . METATARSALGIA 11/24/2008  . FOOT PAIN, LEFT 11/24/2008  . TALIPES CAVUS 11/24/2008    Past Medical History:  Diagnosis Date  . Lichen planus   . Thyroid disease     Current Outpatient  Medications  Medication Sig Dispense Refill  . azithromycin (ZITHROMAX) 250 MG tablet Take 2 tablets on first day. Then take 1 tablet daily. Finish entire supply 6 tablet 0  . clobetasol ointment (TEMOVATE) 0.05 % AFFECTED EAR TWICE A DAY **NOT FOR FACE OR SKIN FOLD AREA** USE FOR 15 DAYS    . folic acid (FOLVITE) 1 MG tablet Take 1 mg by mouth daily.    Marland Kitchen levothyroxine (SYNTHROID, LEVOTHROID) 100 MCG tablet Take 100 mcg by mouth daily.    . methotrexate (RHEUMATREX) 2.5 MG tablet     . triamcinolone cream (KENALOG) 0.1 %     . acitretin (SORIATANE) 25 MG capsule Take 25 mg by mouth daily.    Marland Kitchen albuterol (VENTOLIN HFA) 108 (90 Base) MCG/ACT inhaler Inhale 2 puffs into the lungs every 6 (six) hours as needed for wheezing or shortness of breath. 18 g 3  . benzonatate (TESSALON) 100 MG capsule Take 1 capsule (100 mg total) by mouth 2 (two) times daily as needed for cough. (Patient not taking: Reported on 03/31/2019) 20 capsule 0  . dextromethorphan 15 MG/5ML syrup Take 10 mLs (30 mg total) by mouth 4 (four) times daily as needed for cough. 120 mL 0  . Promethazine-Codeine 6.25-10 MG/5ML SOLN Take 5 mLs by mouth at bedtime as needed (for coughing). 280 mL 0   Current Facility-Administered Medications  Medication Dose Route Frequency Provider Last Rate Last Dose  . methylPREDNISolone acetate (DEPO-MEDROL) injection 40 mg  40 mg Intra-articular Once Claris Gower, MD        No Known Allergies  Social History   Socioeconomic  History  . Marital status: Married    Spouse name: Not on file  . Number of children: Not on file  . Years of education: Not on file  . Highest education level: Not on file  Occupational History  . Not on file  Social Needs  . Financial resource strain: Not on file  . Food insecurity    Worry: Not on file    Inability: Not on file  . Transportation needs    Medical: Not on file    Non-medical: Not on file  Tobacco Use  . Smoking status: Former Research scientist (life sciences)  .  Smokeless tobacco: Never Used  Substance and Sexual Activity  . Alcohol use: Yes    Alcohol/week: 1.0 standard drinks    Types: 1 Glasses of wine per week  . Drug use: No  . Sexual activity: Not on file  Lifestyle  . Physical activity    Days per week: Not on file    Minutes per session: Not on file  . Stress: Not on file  Relationships  . Social Herbalist on phone: Not on file    Gets together: Not on file    Attends religious service: Not on file    Active member of club or organization: Not on file    Attends meetings of clubs or organizations: Not on file    Relationship status: Not on file  . Intimate partner violence    Fear of current or ex partner: Not on file    Emotionally abused: Not on file    Physically abused: Not on file    Forced sexual activity: Not on file  Other Topics Concern  . Not on file  Social History Narrative  . Not on file    ROS Per hpi  Objective   Vitals as reported by the patient: There were no vitals filed for this visit.  Maleki was seen today for cough.  Diagnoses and all orders for this visit:  Coughing -     albuterol (VENTOLIN HFA) 108 (90 Base) MCG/ACT inhaler; Inhale 2 puffs into the lungs every 6 (six) hours as needed for wheezing or shortness of breath. -     dextromethorphan 15 MG/5ML syrup; Take 10 mLs (30 mg total) by mouth 4 (four) times daily as needed for cough. -     Promethazine-Codeine 6.25-10 MG/5ML SOLN; Take 5 mLs by mouth at bedtime as needed (for coughing).   PLAN  Given his recent long term use of Keflex, I will refer to pulmonology today. I believe there's a concern for a mdro or other atypical infection occurring, as azithromycin proved completely ineffective in stopping his symptoms  First, he should receive COVID testing  Discussed supportive therapy  Patient encouraged to call clinic with any questions, comments, or concerns.    I discussed the assessment and treatment plan with the  patient. The patient was provided an opportunity to ask questions and all were answered. The patient agreed with the plan and demonstrated an understanding of the instructions.   The patient was advised to call back or seek an in-person evaluation if the symptoms worsen or if the condition fails to improve as anticipated.  I provided 11 minutes of non-face-to-face time during this encounter.  Maximiano Coss, NP  Primary Care at [facility]

## 2019-04-15 ENCOUNTER — Other Ambulatory Visit: Payer: Self-pay

## 2019-04-15 ENCOUNTER — Encounter: Payer: Self-pay | Admitting: Pulmonary Disease

## 2019-04-15 ENCOUNTER — Ambulatory Visit: Payer: Medicare HMO | Admitting: Pulmonary Disease

## 2019-04-15 VITALS — BP 138/88 | HR 95 | Temp 97.5°F | Ht 72.0 in | Wt 319.0 lb

## 2019-04-15 DIAGNOSIS — R058 Other specified cough: Secondary | ICD-10-CM

## 2019-04-15 DIAGNOSIS — R05 Cough: Secondary | ICD-10-CM

## 2019-04-15 DIAGNOSIS — R059 Cough, unspecified: Secondary | ICD-10-CM

## 2019-04-15 DIAGNOSIS — R0683 Snoring: Secondary | ICD-10-CM | POA: Diagnosis not present

## 2019-04-15 NOTE — Progress Notes (Signed)
Synopsis: Referred in Dec 2020 for sinus infections by Maximiano Coss, NP  Subjective:   PATIENT ID: Todd Harrison GENDER: male DOB: 1946/02/13, MRN: [id number]  Chief Complaint  Patient presents with  . Consult    Consult for cough. He reports a productive cough for 6 weeks. Reports he was sent here to make sure he does not have a fungal infection is his lungs. Reports recurrent sinus infections.     PMH of recurrent sinus infectious and SOB/Cough. [name] was was sick at this time. Symptoms started 6 weeks ago. Usually if his sinuses are inflammated he has tooth pain. He didn't have it this time. He has been coughing now for >6 weeks. 4 weeks ago now he was up at night time with cough. His cough is 99% better since last week. Family members were swabbed for covid and were negative. He has not been tested for covid. Former smoker quit 1982, quit in 10 years, but smoked 4-5 ppd at the time.  At this time his cough is much better.  He has no chest pain.  Patient denies hemoptysis or dark sputum production.  He states that once he had this appointment made his symptoms started to improve slowly.  He has not ever had any pulmonary function test in the past.  He does have some sleep troubles.  He wakes up with a hoarse voice.  He gets up several times in the middle of the night to pee.  He does snore per his wife.  He has never had a sleep study.  Occupation: Patient is a Engineer, technical sales, upright base, electric bass and trombone.   Past Medical History:  Diagnosis Date  . Lichen planus   . Thyroid disease      Family History  Problem Relation Age of Onset  . Diabetes Mother   . Stroke Mother      Past Surgical History:  Procedure Laterality Date  . CATARACT EXTRACTION Left    2019    Social History   Socioeconomic History  . Marital status: Married    Spouse name: Not on file  . Number of children: Not on file  . Years of education: Not on file  . Highest education level:  Not on file  Occupational History  . Not on file  Social Needs  . Financial resource strain: Not on file  . Food insecurity    Worry: Not on file    Inability: Not on file  . Transportation needs    Medical: Not on file    Non-medical: Not on file  Tobacco Use  . Smoking status: Former Smoker    Quit date: 1982    Years since quitting: 38.9  . Smokeless tobacco: Never Used  Substance and Sexual Activity  . Alcohol use: Yes    Alcohol/week: 1.0 standard drinks    Types: 1 Glasses of wine per week  . Drug use: No  . Sexual activity: Not on file  Lifestyle  . Physical activity    Days per week: Not on file    Minutes per session: Not on file  . Stress: Not on file  Relationships  . Social Herbalist on phone: Not on file    Gets together: Not on file    Attends religious service: Not on file    Active member of club or organization: Not on file    Attends meetings of clubs or organizations: Not on file    Relationship  status: Not on file  . Intimate partner violence    Fear of current or ex partner: Not on file    Emotionally abused: Not on file    Physically abused: Not on file    Forced sexual activity: Not on file  Other Topics Concern  . Not on file  Social History Narrative  . Not on file     No Known Allergies   Outpatient Medications Prior to Visit  Medication Sig Dispense Refill  . acitretin (SORIATANE) 25 MG capsule Take 25 mg by mouth daily.    Marland Kitchen albuterol (VENTOLIN HFA) 108 (90 Base) MCG/ACT inhaler Inhale 2 puffs into the lungs every 6 (six) hours as needed for wheezing or shortness of breath. 18 g 3  . clobetasol ointment (TEMOVATE) 0.05 % AFFECTED EAR TWICE A DAY **NOT FOR FACE OR SKIN FOLD AREA** USE FOR 15 DAYS    . dextromethorphan 15 MG/5ML syrup Take 10 mLs (30 mg total) by mouth 4 (four) times daily as needed for cough. 123456 mL 0  . folic acid (FOLVITE) 1 MG tablet Take 1 mg by mouth daily.    Marland Kitchen levothyroxine (SYNTHROID, LEVOTHROID) 100  MCG tablet Take 100 mcg by mouth daily.    . methotrexate (RHEUMATREX) 2.5 MG tablet     . Promethazine-Codeine 6.25-10 MG/5ML SOLN Take 5 mLs by mouth at bedtime as needed (for coughing). 280 mL 0  . triamcinolone cream (KENALOG) 0.1 %     . azithromycin (ZITHROMAX) 250 MG tablet Take 2 tablets on first day. Then take 1 tablet daily. Finish entire supply (Patient not taking: Reported on 04/15/2019) 6 tablet 0  . benzonatate (TESSALON) 100 MG capsule Take 1 capsule (100 mg total) by mouth 2 (two) times daily as needed for cough. (Patient not taking: Reported on 03/31/2019) 20 capsule 0   Facility-Administered Medications Prior to Visit  Medication Dose Route Frequency Provider Last Rate Last Dose  . methylPREDNISolone acetate (DEPO-MEDROL) injection 40 mg  40 mg Intra-articular Once Claris Gower, MD        Review of Systems  Constitutional: Negative for chills, fever, malaise/fatigue and weight loss.  HENT: Negative for hearing loss, sore throat and tinnitus.   Eyes: Negative for blurred vision and double vision.  Respiratory: Positive for cough and sputum production. Negative for hemoptysis, shortness of breath, wheezing and stridor.   Cardiovascular: Negative for chest pain, palpitations, orthopnea, leg swelling and PND.  Gastrointestinal: Negative for abdominal pain, constipation, diarrhea, heartburn, nausea and vomiting.  Genitourinary: Negative for dysuria, hematuria and urgency.  Musculoskeletal: Negative for joint pain and myalgias.  Skin: Negative for itching and rash.  Neurological: Negative for dizziness, tingling, weakness and headaches.  Endo/Heme/Allergies: Negative for environmental allergies. Does not bruise/bleed easily.  Psychiatric/Behavioral: Negative for depression. The patient is not nervous/anxious and does not have insomnia.   All other systems reviewed and are negative.    Objective:  Physical Exam Vitals signs reviewed.  Constitutional:      General: He  is not in acute distress.    Appearance: He is well-developed. He is obese.  HENT:     Head: Normocephalic and atraumatic.  Eyes:     General: No scleral icterus.    Conjunctiva/sclera: Conjunctivae normal.     Pupils: Pupils are equal, round, and reactive to light.  Neck:     Musculoskeletal: Neck supple.     Vascular: No JVD.     Trachea: No tracheal deviation.  Cardiovascular:  Rate and Rhythm: Normal rate and regular rhythm.     Heart sounds: Normal heart sounds. No murmur.  Pulmonary:     Effort: Pulmonary effort is normal. No tachypnea, accessory muscle usage or respiratory distress.     Breath sounds: Normal breath sounds. No stridor. No wheezing, rhonchi or rales.  Abdominal:     General: There is no distension.     Palpations: Abdomen is soft.     Comments: Obese pannus  Musculoskeletal:        General: No tenderness.     Right lower leg: Edema present.     Left lower leg: Edema present.  Lymphadenopathy:     Cervical: No cervical adenopathy.  Skin:    General: Skin is warm and dry.     Capillary Refill: Capillary refill takes less than 2 seconds.     Findings: No rash.  Neurological:     Mental Status: He is alert and oriented to person, place, and time.  Psychiatric:        Behavior: Behavior normal.      Vitals:   04/15/19 1004  BP: 138/88  Pulse: 95  Temp: (!) 97.5 F (36.4 C)  TempSrc: Temporal  SpO2: 95%  Weight: (!) 319 lb (144.7 kg)  Height: 6' (1.829 m)   95% on RA BMI Readings from Last 3 Encounters:  04/15/19 43.26 kg/m  08/10/16 40.88 kg/m  08/03/16 41.09 kg/m   Wt Readings from Last 3 Encounters:  04/15/19 (!) 319 lb (144.7 kg)  08/10/16 (!) 301 lb 6.4 oz (136.7 kg)  08/03/16 (!) 303 lb (137.4 kg)     CBC    Component Value Date/Time   WBC 7.2 08/10/2016 1123   RBC 4.75 08/10/2016 1123   HGB 14.8 08/10/2016 1123   HCT 42.3 (A) 08/10/2016 1123   MCV 89.2 08/10/2016 1123   MCH 31.2 08/10/2016 1123   MCHC 35.0  08/10/2016 1123    Chest Imaging: CXR - 2018 - normal   Pulmonary Functions Testing Results: No flowsheet data found.  FeNO: None   Pathology: None   Echocardiogram: None   Heart Catheterization: None      Assessment & Plan:     ICD-10-CM   1. Cough  R05 FULL - Pulmonary Function Test (LBPU)  2. Snoring  R06.83   3. Post-viral cough syndrome  R05   4. Morbid obesity due to excess calories (HCC)  E66.01     Discussion:  73 year old gentleman seen today with complaints of cough.  To 6 weeks ago had a upper respiratory tract infection, sinus infection as well as chest tightness coughing and sputum production.  Patient was never tested for Covid however his patient's family was also screened for Covid they were all negative.  His grandson also was sick at this time.  Patient's cough has since resolved as it has been approximately 6 weeks since the start of this.  Of note he does have a 40-pack-year history of smoking even though he only smoked for approximately 10 years and quit in 1982.  Additionally patient is on methotrexate for psoriasis.  Which may lower his immune system and predispose to recurrent infection.  We must be mindful of this.  Plan: We will send patient for full pulmonary function test. Patient to follow-up in our clinic in 8 weeks after we are able to obtain pulmonary function test. If patient's cough symptoms return or has a repeat infection.  I think he needs chest imaging. We will start  with 2 view chest imaging. Pending results of pulmonary function test may need to consider axial imaging of the chest. As for his sleep symptoms we will place a consultation to be seen by one of our sleep physicians here in the office.  I believe he may benefit from a home sleep study.  These were discussed with the patient today in the office.  Greater than 50% of this patient's 45-minute office was in face-to-face discussing above recommendations and treatment  plan.   Current Outpatient Medications:  .  acitretin (SORIATANE) 25 MG capsule, Take 25 mg by mouth daily., Disp: , Rfl:  .  albuterol (VENTOLIN HFA) 108 (90 Base) MCG/ACT inhaler, Inhale 2 puffs into the lungs every 6 (six) hours as needed for wheezing or shortness of breath., Disp: 18 g, Rfl: 3 .  clobetasol ointment (TEMOVATE) 0.05 %, AFFECTED EAR TWICE A DAY **NOT FOR FACE OR SKIN FOLD AREA** USE FOR 15 DAYS, Disp: , Rfl:  .  dextromethorphan 15 MG/5ML syrup, Take 10 mLs (30 mg total) by mouth 4 (four) times daily as needed for cough., Disp: 120 mL, Rfl: 0 .  folic acid (FOLVITE) 1 MG tablet, Take 1 mg by mouth daily., Disp: , Rfl:  .  levothyroxine (SYNTHROID, LEVOTHROID) 100 MCG tablet, Take 100 mcg by mouth daily., Disp: , Rfl:  .  methotrexate (RHEUMATREX) 2.5 MG tablet, , Disp: , Rfl:  .  Promethazine-Codeine 6.25-10 MG/5ML SOLN, Take 5 mLs by mouth at bedtime as needed (for coughing)., Disp: 280 mL, Rfl: 0 .  triamcinolone cream (KENALOG) 0.1 %, , Disp: , Rfl:  .  azithromycin (ZITHROMAX) 250 MG tablet, Take 2 tablets on first day. Then take 1 tablet daily. Finish entire supply (Patient not taking: Reported on 04/15/2019), Disp: 6 tablet, Rfl: 0 .  benzonatate (TESSALON) 100 MG capsule, Take 1 capsule (100 mg total) by mouth 2 (two) times daily as needed for cough. (Patient not taking: Reported on 03/31/2019), Disp: 20 capsule, Rfl: 0  Current Facility-Administered Medications:  .  methylPREDNISolone acetate (DEPO-MEDROL) injection 40 mg, 40 mg, Intra-articular, Once, Claris Gower, MD   Garner Nash, DO [facility] 04/15/2019 10:10 AM

## 2019-04-15 NOTE — Patient Instructions (Addendum)
Thank you for visiting Dr. Valeta Harms at [facility]. Today we recommend the following:  Orders Placed This Encounter  Procedures  . FULL - Pulmonary Function Test (LBPU)   Please set patient up with Dr. Ander Slade or Dr. Halford Chessman for next available sleep consultation.  Return in about 8 weeks (around 06/10/2019).    Please do your part to reduce the spread of COVID-19.

## 2019-04-21 ENCOUNTER — Encounter: Payer: Self-pay | Admitting: Pulmonary Disease

## 2019-04-21 ENCOUNTER — Ambulatory Visit: Payer: Medicare HMO | Admitting: Pulmonary Disease

## 2019-04-21 ENCOUNTER — Other Ambulatory Visit: Payer: Self-pay

## 2019-04-21 VITALS — BP 140/86 | HR 60 | Temp 97.9°F | Ht 72.0 in | Wt 322.2 lb

## 2019-04-21 DIAGNOSIS — R0683 Snoring: Secondary | ICD-10-CM

## 2019-04-21 NOTE — Progress Notes (Signed)
[facility]  Chief Complaint  Patient presents with  . Consult    Pateint is here to see if he has sleep apnea.     Constitutional:  BP 140/86 (BP Location: Right Arm, Patient Position: Sitting, Cuff Size: Normal)   Pulse 60   Temp 97.9 F (36.6 C) (Temporal)   Ht 6' (1.829 m)   Wt (!) 322 lb 3.2 oz (146.1 kg)   SpO2 96% Comment: on RA  BMI 43.70 kg/m   Past Medical History:  Psoriasis, Lichen planus, Hypothyroidism, Allergic contact dermatitis  Brief Summary:  Todd Harrison is a 73 y.o. male with snoring.  He was seen earlier this month by Dr. Valeta Harms for cough.  Noted to having snoring and nocturia.  Concern for sleep apnea and was set up with this appointment today.  He doesn't feel like he has trouble with his sleep.  He is not aware of breathing issues at night.  He gets intermittent hoarseness in his throat when his allergies flare up.  He wakes up sometimes to use the bathroom.  He is a night owl.  Doesn't need to use anything to help stay awake.  Sporadically takes melatonin or advil PM if he has trouble falling back to sleep.    He denies sleep walking, sleep talking, bruxism, or nightmares.  There is no history of restless legs.  He denies sleep hallucinations, sleep paralysis, or cataplexy.  The Epworth score is 9 out of 24.     Physical Exam:   Appearance - well kempt   ENMT - clear nasal mucosa, midline nasal  septum, no oral exudates, no LAN, trachea midline  Respiratory - normal chest wall, normal respiratory effort, no accessory muscle use, no wheeze/rales  CV - s1s2 regular rate and rhythm, no murmurs, no peripheral edema, radial pulses symmetric  GI - soft, non tender, no masses  Lymph - no adenopathy noted in neck and axillary areas  MSK - normal gait  Ext - no cyanosis, clubbing, or joint inflammation noted  Skin - no rashes, lesions, or ulcers  Neuro - normal strength, oriented x 3  Psych - normal  mood and affect  Discussion:  His wife has ben concerned about his snoring, and that he stops breathing intermittently during the night.  He does have episodes of hoarseness at night and nocturia.  He is not otherwise convinced he has sleep apnea.    Assessment/Plan:   Snoring. - he will d/w with his wife in more detail and then contact us if he would like to arrange for a home sleep study  Obesity. - discussed how weight can impact sleep and risk for sleep disordered breathing - discussed options to assist with weight loss: combination of diet modification, cardiovascular and strength training exercises  Cardiovascular risk. - had an extensive discussion regarding the adverse health consequences related to untreated sleep disordered breathing - specifically discussed the risks for hypertension, coronary artery disease, cardiac dysrhythmias, cerebrovascular disease, and diabetes - lifestyle modification discussed  Safe driving practices. - discussed how sleep disruption can increase risk of accidents, particularly when driving - safe driving practices were discussed    Patient Instructions  Call or email if you decide to get a home sleep study set up    Chesley Mires, MD [place] Pager: [phone] 04/21/2019, 5:08 PM  Flow Sheet     Pulmonary tests:  Spirometry 09/03/18 >> 3.91 (111%), FEV1% 83 Allergy test 09/03/18 >> grasses  Sleep tests:    Medications:   Allergies as of 04/21/2019   No Known Allergies     Medication List       Accurate as of April 21, 2019  5:08 PM. If you have any questions, ask your nurse or doctor.        STOP taking these medications   azithromycin 250 MG tablet Commonly known as: ZITHROMAX Stopped by: Chesley Mires, MD   benzonatate 100 MG capsule Commonly known as: TESSALON Stopped by: Chesley Mires, MD   clobetasol ointment 0.05 % Commonly known as: TEMOVATE Stopped by: Chesley Mires, MD     TAKE  these medications   acitretin 25 MG capsule Commonly known as: SORIATANE Take 25 mg by mouth daily.   albuterol 108 (90 Base) MCG/ACT inhaler Commonly known as: VENTOLIN HFA Inhale 2 puffs into the lungs every 6 (six) hours as needed for wheezing or shortness of breath.   dextromethorphan 15 MG/5ML syrup Take 10 mLs (30 mg total) by mouth 4 (four) times daily as needed for cough.   folic acid 1 MG tablet Commonly known as: FOLVITE Take 1 mg by mouth daily.   levothyroxine 100 MCG tablet Commonly known as: SYNTHROID Take 100 mcg by mouth daily.   methotrexate 2.5 MG tablet Commonly known as: RHEUMATREX   Promethazine-Codeine 6.25-10 MG/5ML Soln Take 5 mLs by mouth at bedtime as needed (for coughing).   triamcinolone cream 0.1 % Commonly known as: KENALOG       Past Surgical History:  He  has a past surgical history that includes Cataract extraction (Left).  Family History:  His family history includes Diabetes in his mother; Stroke in his mother.  Social History:  He  reports that he quit smoking about 38 years ago. He has never used smokeless tobacco. He reports current alcohol use of about 1.0 standard drinks of alcohol per week. He reports that he does not use drugs.

## 2019-04-21 NOTE — Patient Instructions (Signed)
Call or email if you decide to get a home sleep study set up

## 2019-04-25 ENCOUNTER — Encounter: Payer: Self-pay | Admitting: Registered Nurse

## 2019-04-28 ENCOUNTER — Encounter: Payer: Self-pay | Admitting: Registered Nurse

## 2019-04-30 ENCOUNTER — Other Ambulatory Visit: Payer: Self-pay

## 2019-04-30 ENCOUNTER — Telehealth (INDEPENDENT_AMBULATORY_CARE_PROVIDER_SITE_OTHER): Payer: Medicare HMO | Admitting: Adult Health Nurse Practitioner

## 2019-04-30 DIAGNOSIS — Z79631 Long term (current) use of antimetabolite agent: Secondary | ICD-10-CM

## 2019-04-30 DIAGNOSIS — Z79899 Other long term (current) drug therapy: Secondary | ICD-10-CM

## 2019-04-30 DIAGNOSIS — R058 Other specified cough: Secondary | ICD-10-CM | POA: Insufficient documentation

## 2019-04-30 DIAGNOSIS — R0602 Shortness of breath: Secondary | ICD-10-CM | POA: Insufficient documentation

## 2019-04-30 DIAGNOSIS — R059 Cough, unspecified: Secondary | ICD-10-CM

## 2019-04-30 DIAGNOSIS — J209 Acute bronchitis, unspecified: Secondary | ICD-10-CM | POA: Diagnosis not present

## 2019-04-30 DIAGNOSIS — R05 Cough: Secondary | ICD-10-CM | POA: Diagnosis not present

## 2019-04-30 MED ORDER — PREDNISONE 20 MG PO TABS: ORAL_TABLET | ORAL | 0 refills | Status: DC

## 2019-04-30 NOTE — Patient Instructions (Signed)
° ° ° °  If you have lab work done today you will be contacted with your lab results within the next 2 weeks.  If you have not heard from us then please contact us. The fastest way to get your results is to register for My Chart. ° ° °IF you received an x-ray today, you will receive an invoice from [facility]. Please contact [facility] at [phone] with questions or concerns regarding your invoice.  ° °IF you received labwork today, you will receive an invoice from [facility]. Please contact [facility] at [phone] with questions or concerns regarding your invoice.  ° °Our billing staff will not be able to assist you with questions regarding bills from these companies. ° °You will be contacted with the lab results as soon as they are available. The fastest way to get your results is to activate your My Chart account. Instructions are located on the last page of this paperwork. If you have not heard from us regarding the results in 2 weeks, please contact this office. °  ° ° ° °

## 2019-04-30 NOTE — Progress Notes (Signed)
Telemedicine Encounter- SOAP NOTE Established Patient  This telephone encounter was conducted with the patient's (or proxy's) verbal consent via audio telecommunications: yes/no: Yes Patient was instructed to have this encounter in a suitably private space; and to only have persons present to whom they give permission to participate. In addition, patient identity was confirmed by use of name plus two identifiers (DOB and address).  I discussed the limitations, risks, security and privacy concerns of performing an evaluation and management service by telephone and the availability of in person appointments. I also discussed with the patient that there may be a patient responsible charge related to this service. The patient expressed understanding and agreed to proceed.  I spent a total of TIME; 0 MIN TO 60 MIN: 25 minutes talking with the patient or their proxy.  Chief Complaint  Patient presents with  . Sinus Problem    cough x6wks. Was given a Z-pack for 5 days. Coughing up green/yellow mucous.    Subjective   Todd Harrison is a 73 y.o. established patient. Telephone visit today for persistent cough failing treatment x 1 month   HPI  Patient presents with an over 2 month history of cough.  It has been treated twice with Z-Pak, Keflex, and a Depo-Medrol shot in the office.  Currently, he is on Levaquin for his feet and he will be on a 10-day course.  He is on his second day.  He describes shortness of breath and a persistent mostly nonproductive cough at night which keeps him up.  Has been unable to sleep.  Previous treatments has have helped somewhat but have not resolved his symptoms.  He has had Covid testing and is negative.  Denies chills, fevers, night sweats.  No loss of taste or smell.  He has been coughing now for >6 weeks. 4 weeks ago now he was up at night time with cough.. He has not been tested for covid. Former smoker quit 1982, quit in 10 years, but smoked 4-5 ppd at the  time.  Marland Kitchen  He has not ever had any pulmonary function test in the past.  He does have some sleep troubles.  He wakes up with a hoarse voice.   He does snore per his wife.  He has never had a sleep study.  Patient Active Problem List   Diagnosis Date Noted  . Allergic contact dermatitis 09/09/2018  . Acquired hypothyroidism 06/09/2016  . Rotator cuff tear, right 09/22/2010  . METATARSALGIA 11/24/2008  . FOOT PAIN, LEFT 11/24/2008  . TALIPES CAVUS 11/24/2008    Past Medical History:  Diagnosis Date  . Lichen planus   . Methotrexate, long term, current use   . Psoriasis   . Thyroid disease     Current Outpatient Medications  Medication Sig Dispense Refill  . acitretin (SORIATANE) 25 MG capsule Take 25 mg by mouth daily.    Marland Kitchen albuterol (VENTOLIN HFA) 108 (90 Base) MCG/ACT inhaler Inhale 2 puffs into the lungs every 6 (six) hours as needed for wheezing or shortness of breath. 18 g 3  . dextromethorphan 15 MG/5ML syrup Take 10 mLs (30 mg total) by mouth 4 (four) times daily as needed for cough. 123456 mL 0  . folic acid (FOLVITE) 1 MG tablet Take 1 mg by mouth daily.    Marland Kitchen levothyroxine (SYNTHROID, LEVOTHROID) 100 MCG tablet Take 100 mcg by mouth daily.    . methotrexate (RHEUMATREX) 2.5 MG tablet     . mupirocin ointment (BACTROBAN) 2 %     .  Promethazine-Codeine 6.25-10 MG/5ML SOLN Take 5 mLs by mouth at bedtime as needed (for coughing). 280 mL 0  . triamcinolone cream (KENALOG) 0.1 %     . predniSONE (DELTASONE) 20 MG tablet 3 tab x 2 days 2 tabs x4 days 1 tab x 4 days 18 tablet 0   Current Facility-Administered Medications  Medication Dose Route Frequency Provider Last Rate Last Admin  . methylPREDNISolone acetate (DEPO-MEDROL) injection 40 mg  40 mg Intra-articular Once Claris Gower, MD        No Known Allergies  Social History   Socioeconomic History  . Marital status: Married    Spouse name: Not on file  . Number of children: Not on file  . Years of education: Not on  file  . Highest education level: Not on file  Occupational History  . Not on file  Tobacco Use  . Smoking status: Former Smoker    Quit date: 1982    Years since quitting: 39.0  . Smokeless tobacco: Never Used  Substance and Sexual Activity  . Alcohol use: Yes    Alcohol/week: 1.0 standard drinks    Types: 1 Glasses of wine per week  . Drug use: No  . Sexual activity: Not on file  Other Topics Concern  . Not on file  Social History Narrative  . Not on file   Social Determinants of Health   Financial Resource Strain:   . Difficulty of Paying Living Expenses: Not on file  Food Insecurity:   . Worried About Charity fundraiser in the Last Year: Not on file  . Ran Out of Food in the Last Year: Not on file  Transportation Needs:   . Lack of Transportation (Medical): Not on file  . Lack of Transportation (Non-Medical): Not on file  Physical Activity:   . Days of Exercise per Week: Not on file  . Minutes of Exercise per Session: Not on file  Stress:   . Feeling of Stress : Not on file  Social Connections:   . Frequency of Communication with Friends and Family: Not on file  . Frequency of Social Gatherings with Friends and Family: Not on file  . Attends Religious Services: Not on file  . Active Member of Clubs or Organizations: Not on file  . Attends Archivist Meetings: Not on file  . Marital Status: Not on file  Intimate Partner Violence:   . Fear of Current or Ex-Partner: Not on file  . Emotionally Abused: Not on file  . Physically Abused: Not on file  . Sexually Abused: Not on file    Review of Systems  Constitutional: Positive for diaphoresis and malaise/fatigue. Negative for chills and fever.  HENT: Negative for congestion and ear pain.   Eyes: Negative.   Respiratory: Positive for cough.   Cardiovascular: Negative for chest pain and leg swelling.  Gastrointestinal: Negative.   Psychiatric/Behavioral: Negative.       Objective    General  appearance: Alert and oriented x 3.   Mental Status: normal mood, behavior, speech, dress, motor activity, and thought processes.  1. Complicated acute bronchitis   2. Cough   3. Shortness of breath   4. Cough present for greater than 3 weeks   5. Methotrexate, long term, current use    Orders Placed This Encounter  Procedures  . CT Chest Wo Contrast     Current Outpatient Medications:  .  acitretin (SORIATANE) 25 MG capsule, Take 25 mg by mouth daily., Disp: ,  Rfl:  .  albuterol (VENTOLIN HFA) 108 (90 Base) MCG/ACT inhaler, Inhale 2 puffs into the lungs every 6 (six) hours as needed for wheezing or shortness of breath., Disp: 18 g, Rfl: 3 .  dextromethorphan 15 MG/5ML syrup, Take 10 mLs (30 mg total) by mouth 4 (four) times daily as needed for cough., Disp: 120 mL, Rfl: 0 .  folic acid (FOLVITE) 1 MG tablet, Take 1 mg by mouth daily., Disp: , Rfl:  .  levothyroxine (SYNTHROID, LEVOTHROID) 100 MCG tablet, Take 100 mcg by mouth daily., Disp: , Rfl:  .  methotrexate (RHEUMATREX) 2.5 MG tablet, , Disp: , Rfl:  .  mupirocin ointment (BACTROBAN) 2 %, , Disp: , Rfl:  .  Promethazine-Codeine 6.25-10 MG/5ML SOLN, Take 5 mLs by mouth at bedtime as needed (for coughing)., Disp: 280 mL, Rfl: 0 .  triamcinolone cream (KENALOG) 0.1 %, , Disp: , Rfl:  .  predniSONE (DELTASONE) 20 MG tablet, 3 tab x 2 days 2 tabs x4 days 1 tab x 4 days, Disp: 18 tablet, Rfl: 0  Current Facility-Administered Medications:  .  methylPREDNISolone acetate (DEPO-MEDROL) injection 40 mg, 40 mg, Intra-articular, Once, Guy Begin L, MD  Given his persistent cough and failed treatment, will try a long steroid taper.  During the taper, I have asked that he take the Flonase bid, Albuterol bid, and omeprazole bid x 14 days.  This way would cover for PNA, bronchitis, reflux.  I have asked him to complete the levaquin he is on as it would cover for most pneumonia.  In addition, will get imaging:  CXR and CT of chest.  At  this point, failing medical management, it would not be unreasonable to evaluate advanced imaging of his chest particularly given his heavy smoking hx.  He is inline with this plan.      I discussed the assessment and treatment plan with the patient. The patient was provided an opportunity to ask questions and all were answered. The patient agreed with the plan and demonstrated an understanding of the instructions.   The patient was advised to call back or seek an in-person evaluation if the symptoms worsen or if the condition fails to improve as anticipated.  I provided 21 minutes of non-face-to-face time during this encounter.  Glyn Ade, NP  Primary Care at [facility]

## 2019-05-06 DIAGNOSIS — Z79899 Other long term (current) drug therapy: Secondary | ICD-10-CM | POA: Insufficient documentation

## 2019-05-06 DIAGNOSIS — Z79631 Long term (current) use of antimetabolite agent: Secondary | ICD-10-CM | POA: Insufficient documentation

## 2019-05-07 ENCOUNTER — Telehealth: Payer: Self-pay | Admitting: General Practice

## 2019-05-07 NOTE — Telephone Encounter (Signed)
Insurance has declined authorization for ct.  A peer to peer can be done  Phone number [phone] option 4  Case number is [id number] Member id is [id number] Expires 05/14/2019

## 2019-05-23 ENCOUNTER — Other Ambulatory Visit: Payer: Self-pay

## 2019-05-23 ENCOUNTER — Telehealth (INDEPENDENT_AMBULATORY_CARE_PROVIDER_SITE_OTHER): Payer: Medicare HMO | Admitting: Registered Nurse

## 2019-05-23 ENCOUNTER — Encounter: Payer: Self-pay | Admitting: Registered Nurse

## 2019-05-23 VITALS — Ht 72.0 in | Wt 300.0 lb

## 2019-05-23 DIAGNOSIS — Z87891 Personal history of nicotine dependence: Secondary | ICD-10-CM | POA: Diagnosis not present

## 2019-05-23 DIAGNOSIS — B9689 Other specified bacterial agents as the cause of diseases classified elsewhere: Secondary | ICD-10-CM

## 2019-05-23 DIAGNOSIS — J019 Acute sinusitis, unspecified: Secondary | ICD-10-CM | POA: Diagnosis not present

## 2019-05-23 MED ORDER — AMOXICILLIN-POT CLAVULANATE 875-125 MG PO TABS: 1.0000 | ORAL_TABLET | Freq: Two times a day (BID) | ORAL | 0 refills | Status: DC

## 2019-05-23 NOTE — Progress Notes (Signed)
Telemedicine Encounter- SOAP NOTE Established Patient  This telephone encounter was conducted with the patient's (or proxy's) verbal consent via audio telecommunications: yes  Patient was instructed to have this encounter in a suitably private space; and to only have persons present to whom they give permission to participate. In addition, patient identity was confirmed by use of name plus two identifiers (DOB and address).  I discussed the limitations, risks, security and privacy concerns of performing an evaluation and management service by telephone and the availability of in person appointments. I also discussed with the patient that there may be a patient responsible charge related to this service. The patient expressed understanding and agreed to proceed.  I spent a total of 14 minutes talking with the patient or their proxy.  Chief Complaint  Patient presents with  . Sinus Problem    per pt started 02/25/19 he took the Z-pak with some improvement  . Cough    per pt continues to have  a cough that comes and goes at night with yellow mucus, referral to [facility] (he went there)    Subjective   Todd Harrison is a 74 y.o. established patient. Telephone visit today for sinusitis  HPI Unfortunately not resolved. Been ongoing since October 2020. Has had frequent sinus infections in past. Unfortunately this seems to be going between upper respiratory and lungs. Having some productive cough, sinus pressure, chest congestion, facial headache, dental pain. These have been typical of sinus infections for him in the past.  Notes that he has been treated with azithromycin and levaquin in the preceding months.   He is taking methotrexate 15mg  weekly for psoriasis  He has followed up with pulmonology, who will be getting a pulmonary function test in February. They also suggested CT imaging for ongoing symptoms. He is due for CT screen for ca given his 50 pack year history of  smoking.  Patient Active Problem List   Diagnosis Date Noted  . Methotrexate, long term, current use   . Cough present for greater than 3 weeks 04/30/2019  . Shortness of breath 04/30/2019  . Complicated acute bronchitis 04/30/2019  . Allergic contact dermatitis 09/09/2018  . Acquired hypothyroidism 06/09/2016  . Rotator cuff tear, right 09/22/2010  . METATARSALGIA 11/24/2008  . FOOT PAIN, LEFT 11/24/2008  . TALIPES CAVUS 11/24/2008    Past Medical History:  Diagnosis Date  . Lichen planus   . Methotrexate, long term, current use   . Psoriasis   . Thyroid disease     Current Outpatient Medications  Medication Sig Dispense Refill  . acitretin (SORIATANE) 25 MG capsule Take 25 mg by mouth daily.    Marland Kitchen albuterol (VENTOLIN HFA) 108 (90 Base) MCG/ACT inhaler Inhale 2 puffs into the lungs every 6 (six) hours as needed for wheezing or shortness of breath. 18 g 3  . folic acid (FOLVITE) 1 MG tablet Take 1 mg by mouth daily.    Marland Kitchen levothyroxine (SYNTHROID, LEVOTHROID) 100 MCG tablet Take 100 mcg by mouth daily.    . methotrexate (RHEUMATREX) 2.5 MG tablet     . Promethazine-Codeine 6.25-10 MG/5ML SOLN Take 5 mLs by mouth at bedtime as needed (for coughing). 280 mL 0  . triamcinolone cream (KENALOG) 0.1 %     . amoxicillin-clavulanate (AUGMENTIN) 875-125 MG tablet Take 1 tablet by mouth 2 (two) times daily. 20 tablet 0  . dextromethorphan 15 MG/5ML syrup Take 10 mLs (30 mg total) by mouth 4 (four) times daily as needed  for cough. (Patient not taking: Reported on 05/23/2019) 120 mL 0  . mupirocin ointment (BACTROBAN) 2 %     . predniSONE (DELTASONE) 20 MG tablet 3 tab x 2 days 2 tabs x4 days 1 tab x 4 days (Patient not taking: Reported on 05/23/2019) 18 tablet 0   Current Facility-Administered Medications  Medication Dose Route Frequency Provider Last Rate Last Admin  . methylPREDNISolone acetate (DEPO-MEDROL) injection 40 mg  40 mg Intra-articular Once Claris Gower, MD        No  Known Allergies  Social History   Socioeconomic History  . Marital status: Married    Spouse name: Not on file  . Number of children: Not on file  . Years of education: Not on file  . Highest education level: Not on file  Occupational History  . Not on file  Tobacco Use  . Smoking status: Former Smoker    Quit date: 1982    Years since quitting: 39.0  . Smokeless tobacco: Never Used  Substance and Sexual Activity  . Alcohol use: Yes    Alcohol/week: 1.0 standard drinks    Types: 1 Glasses of wine per week  . Drug use: No  . Sexual activity: Not on file  Other Topics Concern  . Not on file  Social History Narrative  . Not on file   Social Determinants of Health   Financial Resource Strain:   . Difficulty of Paying Living Expenses: Not on file  Food Insecurity:   . Worried About Charity fundraiser in the Last Year: Not on file  . Ran Out of Food in the Last Year: Not on file  Transportation Needs:   . Lack of Transportation (Medical): Not on file  . Lack of Transportation (Non-Medical): Not on file  Physical Activity:   . Days of Exercise per Week: Not on file  . Minutes of Exercise per Session: Not on file  Stress:   . Feeling of Stress : Not on file  Social Connections:   . Frequency of Communication with Friends and Family: Not on file  . Frequency of Social Gatherings with Friends and Family: Not on file  . Attends Religious Services: Not on file  . Active Member of Clubs or Organizations: Not on file  . Attends Archivist Meetings: Not on file  . Marital Status: Not on file  Intimate Partner Violence:   . Fear of Current or Ex-Partner: Not on file  . Emotionally Abused: Not on file  . Physically Abused: Not on file  . Sexually Abused: Not on file    ROS Per hpi   Objective   Vitals as reported by the patient: Today's Vitals   05/23/19 1552  Weight: 300 lb (136.1 kg)  Height: 6' (1.829 m)    [name] was seen today for sinus problem and  cough.  Diagnoses and all orders for this visit:  Former heavy tobacco smoker -     CT CHEST LUNG CA SCREEN LOW DOSE W/O CM; Future  Acute bacterial sinusitis -     amoxicillin-clavulanate (AUGMENTIN) 875-125 MG tablet; Take 1 tablet by mouth 2 (two) times daily.   PLAN  Discussed that augmentin will interact with his methotrexate - he will hold his methotrexate for this week and speak with his dermatologist next week.  Discussed the importance of following up with pulmonology and imaging. We will make sure this is covered by insurance.  Augmentin po bid for 10 days  Patient encouraged  to call clinic with any questions, comments, or concerns.   I discussed the assessment and treatment plan with the patient. The patient was provided an opportunity to ask questions and all were answered. The patient agreed with the plan and demonstrated an understanding of the instructions.   The patient was advised to call back or seek an in-person evaluation if the symptoms worsen or if the condition fails to improve as anticipated.  I provided 14 minutes of non-face-to-face time during this encounter.  Maximiano Coss, NP  Primary Care at [facility]

## 2019-06-04 ENCOUNTER — Ambulatory Visit: Payer: Medicare HMO

## 2019-06-09 ENCOUNTER — Other Ambulatory Visit (HOSPITAL_COMMUNITY)
Admission: RE | Admit: 2019-06-09 | Discharge: 2019-06-09 | Disposition: A | Discharge status: Home / Self Care | Payer: Medicare HMO | Source: Ambulatory Visit | Attending: Pulmonary Disease | Admitting: Pulmonary Disease

## 2019-06-09 DIAGNOSIS — Z20822 Contact with and (suspected) exposure to covid-19: Secondary | ICD-10-CM | POA: Diagnosis not present

## 2019-06-09 DIAGNOSIS — Z01812 Encounter for preprocedural laboratory examination: Secondary | ICD-10-CM | POA: Insufficient documentation

## 2019-06-09 LAB — SARS CORONAVIRUS 2 (TAT 6-24 HRS): SARS Coronavirus 2: NEGATIVE

## 2019-06-12 ENCOUNTER — Ambulatory Visit (INDEPENDENT_AMBULATORY_CARE_PROVIDER_SITE_OTHER): Payer: Medicare HMO | Admitting: Pulmonary Disease

## 2019-06-12 ENCOUNTER — Other Ambulatory Visit: Payer: Self-pay

## 2019-06-12 DIAGNOSIS — R05 Cough: Secondary | ICD-10-CM

## 2019-06-12 DIAGNOSIS — R059 Cough, unspecified: Secondary | ICD-10-CM

## 2019-06-12 LAB — PULMONARY FUNCTION TEST
DL/VA % pred: 94 %
DL/VA: 3.76 ml/min/mmHg/L
DLCO unc % pred: 87 %
DLCO unc: 24.05 ml/min/mmHg
FEF 25-75 Post: 2.23 L/sec
FEF 25-75 Pre: 2.46 L/sec
FEF2575-%Change-Post: -9 %
FEF2575-%Pred-Post: 85 %
FEF2575-%Pred-Pre: 94 %
FEV1-%Change-Post: -1 %
FEV1-%Pred-Post: 85 %
FEV1-%Pred-Pre: 87 %
FEV1-Post: 2.98 L
FEV1-Pre: 3.04 L
FEV1FVC-%Change-Post: -3 %
FEV1FVC-%Pred-Pre: 103 %
FEV6-%Change-Post: 0 %
FEV6-%Pred-Post: 90 %
FEV6-%Pred-Pre: 89 %
FEV6-Post: 4.03 L
FEV6-Pre: 3.99 L
FEV6FVC-%Change-Post: 0 %
FEV6FVC-%Pred-Post: 104 %
FEV6FVC-%Pred-Pre: 105 %
FVC-%Change-Post: 1 %
FVC-%Pred-Post: 86 %
FVC-%Pred-Pre: 84 %
FVC-Post: 4.07 L
FVC-Pre: 4 L
Post FEV1/FVC ratio: 73 %
Post FEV6/FVC ratio: 99 %
Pre FEV1/FVC ratio: 76 %
Pre FEV6/FVC Ratio: 100 %
RV % pred: 87 %
RV: 2.25 L
TLC % pred: 86 %
TLC: 6.46 L

## 2019-06-12 NOTE — Progress Notes (Signed)
PFT done today. 

## 2019-06-13 ENCOUNTER — Ambulatory Visit: Payer: Medicare HMO

## 2019-06-20 NOTE — Telephone Encounter (Signed)
mychart message sent by pt checking to see what the results of the pft from 2/4 were. Dr. Valeta Harms, please advise on this for pt.

## 2019-06-21 ENCOUNTER — Ambulatory Visit: Payer: Medicare HMO

## 2019-10-08 ENCOUNTER — Telehealth: Payer: Self-pay | Admitting: Pulmonary Disease

## 2019-10-08 NOTE — Telephone Encounter (Signed)
I do not see a MRI to be scheduled we will need a order

## 2019-10-08 NOTE — Telephone Encounter (Signed)
Spoke with the pt  He is asking about MRI and states that Dr Valeta Harms has mentioned this before  I reviewed last note and see mention of maybe doing cxr but no MRI  He states the cough continues to be an issue  I have scheduled him to see Tammy 10/08/19 at 4:30 pm for eval

## 2019-10-17 ENCOUNTER — Ambulatory Visit (INDEPENDENT_AMBULATORY_CARE_PROVIDER_SITE_OTHER): Payer: Medicare HMO | Admitting: Family Medicine

## 2019-10-17 ENCOUNTER — Other Ambulatory Visit: Payer: Self-pay

## 2019-10-17 ENCOUNTER — Ambulatory Visit (INDEPENDENT_AMBULATORY_CARE_PROVIDER_SITE_OTHER): Payer: Medicare HMO

## 2019-10-17 VITALS — BP 114/45 | HR 71 | Temp 98.1°F | Ht 72.0 in | Wt 319.4 lb

## 2019-10-17 DIAGNOSIS — R062 Wheezing: Secondary | ICD-10-CM

## 2019-10-17 DIAGNOSIS — R05 Cough: Secondary | ICD-10-CM

## 2019-10-17 DIAGNOSIS — R0989 Other specified symptoms and signs involving the circulatory and respiratory systems: Secondary | ICD-10-CM | POA: Diagnosis not present

## 2019-10-17 DIAGNOSIS — R059 Cough, unspecified: Secondary | ICD-10-CM

## 2019-10-17 MED ORDER — FLUTICASONE-SALMETEROL 100-50 MCG/DOSE IN AEPB: 1.0000 | INHALATION_SPRAY | Freq: Two times a day (BID) | RESPIRATORY_TRACT | 2 refills | Status: DC

## 2019-10-17 MED ORDER — MONTELUKAST SODIUM 10 MG PO TABS: 10.0000 mg | ORAL_TABLET | Freq: Every day | ORAL | 3 refills | Status: DC

## 2019-10-17 MED ORDER — ALBUTEROL SULFATE HFA 108 (90 BASE) MCG/ACT IN AERS: 2.0000 | INHALATION_SPRAY | Freq: Four times a day (QID) | RESPIRATORY_TRACT | 3 refills | Status: DC | PRN

## 2019-10-17 NOTE — Patient Instructions (Addendum)
  I recommended that you contact the pulmonologist who first saw you for the cough and do a follow-up visit there sometime in the near future.  Use the Singulair 1 pill each evening before bedtime (montelukast)  Use the Advair (fluticasone/salmeterol) inhaler 1 inhalation twice daily.  Read the instructions carefully on how to use it.  Continue using your albuterol inhaler 2 puffs every 4-6 hours when needed for wheezing or coughing.  If you do not feel like things are improving after these treatments and after seeing the pulmonologist, feel free to come back for further assessment.   If you have lab work done today you will be contacted with your lab results within the next 2 weeks.  If you have not heard from [place] then please contact us. The fastest way to get your results is to register for My Chart.   IF you received an x-ray today, you will receive an invoice from [facility]. Please contact [facility] at [phone] with questions or concerns regarding your invoice.   IF you received labwork today, you will receive an invoice from [facility]. Please contact [facility] at [phone] with questions or concerns regarding your invoice.   Our billing staff will not be able to assist you with questions regarding bills from these companies.  You will be contacted with the lab results as soon as they are available. The fastest way to get your results is to activate your My Chart account. Instructions are located on the last page of this paperwork. If you have not heard from [place] regarding the results in 2 weeks, please contact this office.

## 2019-10-17 NOTE — Progress Notes (Signed)
Patient ID: Todd Harrison, male    DOB: Sep 23, 1945  Age: 74 y.o. MRN: [id number]  Chief Complaint  Patient presents with  . chest congestion    Pt stated that he has been having some chest congestion for the past 3 mos that want go away and it is getting worse and more so at night.    Subjective:  Patient has a long history of getting congested and short of breath.  He has a long history of sinus congestion issues.  Over the recent weeks has been getting worse with more coughing throughout the day and night and wheezes at night.  He does not smoke, having quit 40 years ago.  He still works a Archivist.  No fever.  No new allergies though he has some seasonal allergies.  He is on prednisone already for his lichen planus and that has not seemed to help his lungs.  He has seen a pulmonologist in the past.  Also was assessed for sleep apnea I believe.  He has a history of having taken methotrexate though he is no longer on that.  The pulmonologist previously discolored postviral cough, but it has continued to persist.  He annually goes to the primary care doctor who does an EKG on him.  I cannot find them in the computer but apparently they have been okay always he never has any chest pain or heart symptoms.  He has terrible lichen planus and it hurts his feet so much that he can hardly walk it is not that much exercise so he has gained a little weight in the last year.  Current allergies, medications, problem list, past/family and social histories reviewed.  Objective:  BP (!) 114/45 (BP Location: Right Arm, Patient Position: Sitting, Cuff Size: Large)   Pulse 71   Temp 98.1 F (36.7 C) (Temporal)   Ht 6' (1.829 m)   Wt (!) 319 lb 6.4 oz (144.9 kg)   SpO2 94%   BMI 43.32 kg/m   No major acute distress.  Obese.  Throat clear.  Neck supple without nodes or thyromegaly.  No sinus tenderness.  Chest is clear to auscultation until he does forced expiration which causes  wheezing and coughing. Radiology reading is still pending.  I do not see anything significant on the chest x-ray.   Assessment & Plan:   Assessment: 1. Chest congestion   2. Nocturnal cough with wheeze   3. Coughing       Plan: See instructions.  Needs to see the pulmonary doctor back.  Orders Placed This Encounter  Procedures  . DG Chest 2 View    Standing Status:   Future    Number of Occurrences:   1    Standing Expiration Date:   10/16/2020    Order Specific Question:   Reason for Exam (SYMPTOM  OR DIAGNOSIS REQUIRED)    Answer:   chest congestion    Order Specific Question:   Preferred imaging location?    Answer:   Internal    Order Specific Question:   Radiology Contrast Protocol - do NOT remove file path    Answer:   [email or web address]  . CMP14+EGFR  . CBC    No orders of the defined types were placed in this encounter.        Patient Instructions    I recommended that you contact the pulmonologist who first saw you for the cough and do a follow-up visit there sometime in the near future.  Use the Singulair 1 pill each evening before bedtime (montelukast)  Use the Advair (fluticasone/salmeterol) inhaler 1 inhalation twice daily.  Read the instructions carefully on how to use it.  Continue using your albuterol inhaler 2 puffs every 4-6 hours when needed for wheezing or coughing.  If you do not feel like things are improving after these treatments and after seeing the pulmonologist, feel free to come back for further assessment.   If you have lab work done today you will be contacted with your lab results within the next 2 weeks.  If you have not heard from [place] then please contact us. The fastest way to get your results is to register for My Chart.   IF you received an x-ray today, you will receive an invoice from [facility]. Please contact [facility] at [phone] with questions or concerns  regarding your invoice.   IF you received labwork today, you will receive an invoice from [facility]. Please contact [facility] at [phone] with questions or concerns regarding your invoice.   Our billing staff will not be able to assist you with questions regarding bills from these companies.  You will be contacted with the lab results as soon as they are available. The fastest way to get your results is to activate your My Chart account. Instructions are located on the last page of this paperwork. If you have not heard from [place] regarding the results in 2 weeks, please contact this office.        No follow-ups on file.   Ruben Reason, MD 10/17/2019

## 2019-10-18 LAB — CMP14+EGFR
ALT: 13 IU/L (ref 0–44)
AST: 12 IU/L (ref 0–40)
Albumin/Globulin Ratio: 1.8 (ref 1.2–2.2)
Albumin: 3.8 g/dL (ref 3.7–4.7)
Alkaline Phosphatase: 56 IU/L (ref 48–121)
BUN/Creatinine Ratio: 14 (ref 10–24)
BUN: 17 mg/dL (ref 8–27)
Bilirubin Total: 0.6 mg/dL (ref 0.0–1.2)
CO2: 20 mmol/L (ref 20–29)
Calcium: 8.8 mg/dL (ref 8.6–10.2)
Chloride: 103 mmol/L (ref 96–106)
Creatinine, Ser: 1.25 mg/dL (ref 0.76–1.27)
GFR calc Af Amer: 67 mL/min/{1.73_m2} (ref >59)
GFR calc non Af Amer: 58 mL/min/{1.73_m2} — ABNORMAL LOW (ref >59)
Globulin, Total: 2.1 g/dL (ref 1.5–4.5)
Glucose: 124 mg/dL — ABNORMAL HIGH (ref 65–99)
Potassium: 3.6 mmol/L (ref 3.5–5.2)
Sodium: 139 mmol/L (ref 134–144)
Total Protein: 5.9 g/dL — ABNORMAL LOW (ref 6.0–8.5)

## 2019-10-18 LAB — CBC
Hematocrit: 42.7 % (ref 37.5–51.0)
Hemoglobin: 14.6 g/dL (ref 13.0–17.7)
MCH: 30.5 pg (ref 26.6–33.0)
MCHC: 34.2 g/dL (ref 31.5–35.7)
MCV: 89 fL (ref 79–97)
Platelets: 277 10*3/uL (ref 150–450)
RBC: 4.78 x10E6/uL (ref 4.14–5.80)
RDW: 12.7 % (ref 11.6–15.4)
WBC: 13.1 10*3/uL — ABNORMAL HIGH (ref 3.4–10.8)

## 2019-10-20 ENCOUNTER — Ambulatory Visit: Payer: Medicare HMO | Admitting: Adult Health

## 2019-10-20 ENCOUNTER — Other Ambulatory Visit: Payer: Self-pay

## 2019-10-20 ENCOUNTER — Encounter: Payer: Self-pay | Admitting: Adult Health

## 2019-10-20 DIAGNOSIS — J4530 Mild persistent asthma, uncomplicated: Secondary | ICD-10-CM | POA: Diagnosis not present

## 2019-10-20 DIAGNOSIS — J45909 Unspecified asthma, uncomplicated: Secondary | ICD-10-CM | POA: Insufficient documentation

## 2019-10-20 NOTE — Patient Instructions (Signed)
Continue on Advair 1 puff Twice daily  , rinse after use  Continue on Singulair 10mg  At bedtime .  Begin Zyrtec 10mg  At bedtime.  Begin Mucinex DM Twice daily  As needed  Cough/congestion  Begin Prilosec 20mg  daily in am before meal .  Sputum culture .  Follow up with Dr. Valeta Harms in 4-6 weeks and As needed   Please contact office for sooner follow up if symptoms do not improve or worsen or seek emergency care

## 2019-10-20 NOTE — Assessment & Plan Note (Signed)
Recurrent Bronchitis - PFT are normal . CXR w/ no acute process. Would check sputum culture as immunosuppressed.  Treat for possible asthma component and control for triggers   Plan  Patient Instructions  Continue on Advair 1 puff Twice daily  , rinse after use  Continue on Singulair 10mg  At bedtime .  Begin Zyrtec 10mg  At bedtime.  Begin Mucinex DM Twice daily  As needed  Cough/congestion  Begin Prilosec 20mg  daily in am before meal .  Sputum culture .  Follow up with Dr. Valeta Harms in 4-6 weeks and As needed   Please contact office for sooner follow up if symptoms do not improve or worsen or seek emergency care

## 2019-10-20 NOTE — Progress Notes (Signed)
@Patient  ID: Todd Harrison, male    DOB: [birth year], 74 y.o.   MRN: [id number]  Chief Complaint  Patient presents with   Follow-up    Cough     Referring provider: Lorene Dy, MD  HPI: 74 year old male former seen for pulmonary consult April 15, 2019 for cough Patient is a jazz musician  TEST/EVENTS :  Chest x-ray October 17, 2019 showed no acute cardiopulmonary abnormality.  10/20/2019 Follow up : Cough  Patient returns for a follow-up visit and pulmonary function testing.  Patient was seen for a pulmonary consult April 15, 2019 for ongoing cough that continued after URI.  Patient was set up for pulmonary function testing that was done on June 12, 2019 that showed normal lung function with FEV1 at 87%, ratio 76, FVC 84%.  No significant bronchodilator response.  DLCO was 87%.  Chest x-ray done October 17, 2019 showed no acute process.  Patient says after last visit in 04/2019 he got better after he was treated for skin infection (MRSA)  with antibiotics .  Says cough returned around 6 weeks ago, worse for last couple of weeks . Coughing up thick white mucus with associated wheezing . Seen by PCP , started on Advair and albuterol, singulair. Is feeling some better since starting .   Some Gerd and sinus drainage.   Following with Dermatology for Lichen Planus currently on cellcept and daily prednisone . Previously on Methotrexate.   Has 2 cats .    Allergies  Allergen Reactions   Gold Itching   Mixed Grasses     Immunization History  Administered Date(s) Administered   Fluad Quad(high Dose 65+) 03/04/2019   Influenza-Unspecified 03/08/2016    Past Medical History:  Diagnosis Date   Lichen planus    Methotrexate, long term, current use    Psoriasis    Thyroid disease     Tobacco History: Social History   Tobacco Use  Smoking Status Former Smoker   Quit date: 1982   Years since quitting: 39.4  Smokeless Tobacco Never Used   Counseling given:  Not Answered   Outpatient Medications Prior to Visit  Medication Sig Dispense Refill   acitretin (SORIATANE) 25 MG capsule Take 25 mg by mouth daily.     albuterol (VENTOLIN HFA) 108 (90 Base) MCG/ACT inhaler Inhale 2 puffs into the lungs every 6 (six) hours as needed for wheezing or shortness of breath. 18 g 3   augmented betamethasone dipropionate (DIPROLENE-AF) 0.05 % cream      dextromethorphan 15 MG/5ML syrup Take 10 mLs (30 mg total) by mouth 4 (four) times daily as needed for cough. 120 mL 0   Fluticasone-Salmeterol (ADVAIR DISKUS) 100-50 MCG/DOSE AEPB Inhale 1 puff into the lungs in the morning and at bedtime. 60 each 2   folic acid (FOLVITE) 1 MG tablet Take 1 mg by mouth daily.     hydrOXYzine (ATARAX/VISTARIL) 25 MG tablet Take 25 mg by mouth daily.     levothyroxine (SYNTHROID, LEVOTHROID) 100 MCG tablet Take 100 mcg by mouth daily.     methotrexate (RHEUMATREX) 2.5 MG tablet      montelukast (SINGULAIR) 10 MG tablet Take 1 tablet (10 mg total) by mouth at bedtime. 30 tablet 3   mupirocin ointment (BACTROBAN) 2 %      mycophenolate (CELLCEPT) 500 MG tablet Take by mouth 2 (two) times daily.     triamcinolone cream (KENALOG) 0.1 %      triamcinolone ointment (KENALOG) 0.1 % APPLY TO AFFECTED  AREA TWICE A DAY     amoxicillin-clavulanate (AUGMENTIN) 875-125 MG tablet Take 1 tablet by mouth 2 (two) times daily. 20 tablet 0   predniSONE (DELTASONE) 20 MG tablet 3 tab x 2 days 2 tabs x4 days 1 tab x 4 days (Patient not taking: Reported on 05/23/2019) 18 tablet 0   Promethazine-Codeine 6.25-10 MG/5ML SOLN Take 5 mLs by mouth at bedtime as needed (for coughing). 280 mL 0   Facility-Administered Medications Prior to Visit  Medication Dose Route Frequency Provider Last Rate Last Admin   methylPREDNISolone acetate (DEPO-MEDROL) injection 40 mg  40 mg Intra-articular Once Claris Gower, MD         Review of Systems:   Constitutional:   No  weight loss, night  sweats,  Fevers, chills,  +fatigue, or  lassitude.  HEENT:   No headaches,  Difficulty swallowing,  Tooth/dental problems, or  Sore throat,                No sneezing, itching, ear ache, nasal congestion, post nasal drip,   CV:  No chest pain,  Orthopnea, PND, swelling in lower extremities, anasarca, dizziness, palpitations, syncope.   GI  No heartburn, indigestion, abdominal pain, nausea, vomiting, diarrhea, change in bowel habits, loss of appetite, bloody stools.   Resp:   No chest wall deformity  Skin: lichen planus   GU: no dysuria, change in color of urine, no urgency or frequency.  No flank pain, no hematuria   MS:  No joint pain or swelling.  No decreased range of motion.  No back pain.    Physical Exam  BP 122/84 (BP Location: Left Arm, Cuff Size: Large)    Pulse 69    Temp (!) 97.2 F (36.2 C) (Oral)    Ht 6' (1.829 m)    Wt (!) 317 lb 9.6 oz (144.1 kg)    SpO2 95%    BMI 43.07 kg/m   GEN: A/Ox3; pleasant , NAD, BMI 43    HEENT:  [place]/AT,  EACs-clear, TMs-wnl, NOSE-clear, THROAT-clear, no lesions, no postnasal drip or exudate noted.   NECK:  Supple w/ fair ROM; no JVD; normal carotid impulses w/o bruits; no thyromegaly or nodules palpated; no lymphadenopathy.    RESP  Clear  P & A; w/o, wheezes/ rales/ or rhonchi. no accessory muscle use, no dullness to percussion  CARD:  RRR, no m/r/g, tr  peripheral edema, pulses intact, no cyanosis or clubbing.  GI:   Soft & nt; nml bowel sounds; no organomegaly or masses detected.   Musco: Warm bil, no deformities or joint swelling noted.   Neuro: alert, no focal deficits noted.    Skin: Warm, no lesions or rashes     BNP No results found for: BNP  ProBNP No results found for: PROBNP  Imaging: DG Chest 2 View  Result Date: 10/17/2019 CLINICAL DATA:  Chest congestion.  Nocturnal cough with wheeze. EXAM: CHEST - 2 VIEW COMPARISON:  Chest radiograph 08/03/2016 and earlier FINDINGS: Heart size within normal limits.  Aortic atherosclerosis. Appreciable airspace consolidation within the lungs. No frank pulmonary edema. No evidence of pleural effusion or pneumothorax. No acute bony abnormality. Thoracic spondylosis. IMPRESSION: No evidence of acute cardiopulmonary abnormality. Aortic Atherosclerosis (ICD10-I70.0). Electronically Signed   By: Kellie Simmering DO   On: 10/17/2019 12:15      PFT Results Latest Ref Rng & Units 06/12/2019  FVC-Pre L 4.00  FVC-Predicted Pre % 84  FVC-Post L 4.07  FVC-Predicted Post % 86  Pre FEV1/FVC % % 76  Post FEV1/FCV % % 73  FEV1-Pre L 3.04  FEV1-Predicted Pre % 87  FEV1-Post L 2.98  DLCO UNC% % 87  DLCO COR %Predicted % 94  TLC L 6.46  TLC % Predicted % 86  RV % Predicted % 87    No results found for: NITRICOXIDE      Assessment & Plan:   Asthmatic bronchitis Recurrent Bronchitis - PFT are normal . CXR w/ no acute process. Would check sputum culture as immunosuppressed.  Treat for possible asthma component and control for triggers   Plan  Patient Instructions  Continue on Advair 1 puff Twice daily  , rinse after use  Continue on Singulair 10mg  At bedtime .  Begin Zyrtec 10mg  At bedtime.  Begin Mucinex DM Twice daily  As needed  Cough/congestion  Begin Prilosec 20mg  daily in am before meal .  Sputum culture .  Follow up with Dr. Valeta Harms in 4-6 weeks and As needed   Please contact office for sooner follow up if symptoms do not improve or worsen or seek emergency care          Rexene Edison, NP 10/20/2019

## 2019-10-20 NOTE — Progress Notes (Signed)
PCCM: thanks for seeing him Garner Nash, DO [facility] 10/20/2019 5:45 PM

## 2019-10-23 ENCOUNTER — Other Ambulatory Visit: Payer: Medicare HMO

## 2019-10-24 ENCOUNTER — Other Ambulatory Visit: Payer: Medicare HMO

## 2019-10-24 ENCOUNTER — Other Ambulatory Visit: Payer: Self-pay | Admitting: *Deleted

## 2019-10-24 DIAGNOSIS — R059 Cough, unspecified: Secondary | ICD-10-CM

## 2019-10-27 LAB — RESPIRATORY CULTURE OR RESPIRATORY AND SPUTUM CULTURE
MICRO NUMBER:: 10608010
RESULT:: NORMAL
SPECIMEN QUALITY:: ADEQUATE

## 2019-11-17 ENCOUNTER — Encounter: Payer: Self-pay | Admitting: Family Medicine

## 2019-11-21 ENCOUNTER — Other Ambulatory Visit: Payer: Self-pay

## 2019-11-21 ENCOUNTER — Ambulatory Visit (INDEPENDENT_AMBULATORY_CARE_PROVIDER_SITE_OTHER): Payer: Medicare HMO | Admitting: Family Medicine

## 2019-11-21 ENCOUNTER — Encounter: Payer: Self-pay | Admitting: Family Medicine

## 2019-11-21 VITALS — BP 138/81 | HR 87 | Temp 98.4°F | Ht 72.0 in | Wt 319.4 lb

## 2019-11-21 DIAGNOSIS — K219 Gastro-esophageal reflux disease without esophagitis: Secondary | ICD-10-CM | POA: Diagnosis not present

## 2019-11-21 DIAGNOSIS — R059 Cough, unspecified: Secondary | ICD-10-CM

## 2019-11-21 DIAGNOSIS — R05 Cough: Secondary | ICD-10-CM

## 2019-11-21 DIAGNOSIS — R0989 Other specified symptoms and signs involving the circulatory and respiratory systems: Secondary | ICD-10-CM | POA: Diagnosis not present

## 2019-11-21 DIAGNOSIS — R058 Other specified cough: Secondary | ICD-10-CM

## 2019-11-21 DIAGNOSIS — R0981 Nasal congestion: Secondary | ICD-10-CM | POA: Diagnosis not present

## 2019-11-21 DIAGNOSIS — R062 Wheezing: Secondary | ICD-10-CM

## 2019-11-21 MED ORDER — OMEPRAZOLE 40 MG PO CPDR: 40.0000 mg | DELAYED_RELEASE_CAPSULE | Freq: Every day | ORAL | 3 refills | Status: DC

## 2019-11-21 MED ORDER — AMOXICILLIN-POT CLAVULANATE 875-125 MG PO TABS: 1.0000 | ORAL_TABLET | Freq: Two times a day (BID) | ORAL | 0 refills | Status: DC

## 2019-11-21 MED ORDER — PREDNISONE 20 MG PO TABS: ORAL_TABLET | ORAL | 0 refills | Status: DC

## 2019-11-21 NOTE — Progress Notes (Signed)
Cannot seem to clear

## 2019-11-21 NOTE — Patient Instructions (Addendum)
  Take Augmentin 875 1 twice daily for infection  Take prednisone 20 mg 3 pills daily for 2 days, then 2 daily for 2 days, then 1 daily for 2 days, then one half daily for inflammation  Continue using your inhalers and Delsym if needed  Take omeprazole 2 mg 1 daily for acid reflux  CT scan of the sinuses is being scheduled  Referral is being made to ear nose and throat  [place], so if you have problems on hearing about the CT or referral over the next week call back and speak to the referrals desk.  If you keep having a lot of symptoms you might also insist on trying to get to see your pulmonologist, not the ancillary people.  If you have lab work done today you will be contacted with your lab results within the next 2 weeks.  If you have not heard from [place] then please contact us. The fastest way to get your results is to register for My Chart.   IF you received an x-ray today, you will receive an invoice from [facility]. Please contact [facility] at [phone] with questions or concerns regarding your invoice.   IF you received labwork today, you will receive an invoice from [place]. Please contact [facility] at [phone] with questions or concerns regarding your invoice.   Our billing staff will not be able to assist you with questions regarding bills from these companies.  You will be contacted with the lab results as soon as they are available. The fastest way to get your results is to activate your My Chart account. Instructions are located on the last page of this paperwork. If you have not heard from [place] regarding the results in 2 weeks, please contact this office.

## 2019-11-21 NOTE — Progress Notes (Signed)
Patient ID: Todd Harrison, male    DOB: Feb 06, 1946  Age: 74 y.o. MRN: [id number]  Chief Complaint  Patient presents with  . Follow-up    chest congestion. Pt stated that he feels a little better but he is still coughing, wheezing, and post nasal drainage.    Subjective:   74 year old man who comes in today because he continues to have his chronic cough.  I have seen him before for this and he is seeing his lung specialist also.  He is been having a cough since this winter.  With his initial treatments back in the winter he improved some then it came back.  He coughs daily and night.  It wakes him and his wife up at night.  He has been treated with antibiotics, inhalers, Singulair, etc.  He has put on a lot of weight because of his lichen planus problems on his feet which were very painful they are doing better now.  He was on steroids for that.  Nothing really has helped his lungs.  He is taken PPI for his lungs also.  He has not seen an ENT.  He saw the nurse practitioner when he try to see the pulmonologist.  Current allergies, medications, problem list, past/family and social histories reviewed.  Objective:  BP 138/81 (BP Location: Right Arm, Patient Position: Sitting, Cuff Size: Large)   Pulse 87   Temp 98.4 F (36.9 C) (Temporal)   Ht 6' (1.829 m)   Wt (!) 319 lb 6.4 oz (144.9 kg)   SpO2 93%   BMI 43.32 kg/m  No acute distress.  Coughs.  He is frustrated.  Throat clear.  Sinuses not particularly tender.  Neck supple without nodes.  Chest clear to auscultation.  Heart regular without murmurs.  Assessment & Plan:   Assessment: 1. Chest congestion   2. Coughing   3. Nocturnal cough with wheeze   4. Nasal sinus congestion   5. Gastroesophageal reflux disease without esophagitis       Plan: I really do not have an easy answer for him.  We will try steroids and antibiotics again, along with checking his sinuses and referring to an ENT.  May be a nasopharyngoscopy would show  something down and is laryngeal area with regard to reflux or other problem there.  See instructions.  Orders Placed This Encounter  Procedures  . CT Maxillofacial W/Cm    Standing Status:   Future    Standing Expiration Date:   11/20/2020    Order Specific Question:   ** REASON FOR EXAM (FREE TEXT)    Answer:   Chronic sinus drainage and cough    Order Specific Question:   If indicated for the ordered procedure, I authorize the administration of contrast media per Radiology protocol    Answer:   Yes    Order Specific Question:   Preferred imaging location?    Answer:   [facility]    Order Specific Question:   Radiology Contrast Protocol - do NOT remove file path    Answer:   [email or web address]  . Ambulatory referral to ENT    Referral Priority:   Routine    Referral Type:   Consultation    Referral Reason:   Specialty Services Required    Referred to Provider:   Jodi Marble, MD    Requested Specialty:   Otolaryngology    Number of Visits Requested:   1    Meds ordered this encounter  Medications  .  amoxicillin-clavulanate (AUGMENTIN) 875-125 MG tablet    Sig: Take 1 tablet by mouth 2 (two) times daily.    Dispense:  20 tablet    Refill:  0  . predniSONE (DELTASONE) 20 MG tablet    Sig: Take 3 each morning for 2 days, then 2 for 2 days, then 1 for 2 days, then one half for 4 days.    Dispense:  14 tablet    Refill:  0  . omeprazole (PRILOSEC) 40 MG capsule    Sig: Take 1 capsule (40 mg total) by mouth daily.    Dispense:  30 capsule    Refill:  3         Patient Instructions    Take Augmentin 875 1 twice daily for infection  Take prednisone 20 mg 3 pills daily for 2 days, then 2 daily for 2 days, then 1 daily for 2 days, then one half daily for inflammation  Continue using your inhalers and Delsym if needed  Take omeprazole 2 mg 1 daily for acid reflux  CT scan of the sinuses is being scheduled  Referral is being made to  ear nose and throat  I will be out of the country, so if you have problems on hearing about the CT or referral over the next week call back and speak to the referrals desk.  If you keep having a lot of symptoms you might also insist on trying to get to see your pulmonologist, not the ancillary people.  If you have lab work done today you will be contacted with your lab results within the next 2 weeks.  If you have not heard from [place] then please contact us. The fastest way to get your results is to register for My Chart.   IF you received an x-ray today, you will receive an invoice from [facility]. Please contact [facility] at [phone] with questions or concerns regarding your invoice.   IF you received labwork today, you will receive an invoice from [place]. Please contact [facility] at [phone] with questions or concerns regarding your invoice.   Our billing staff will not be able to assist you with questions regarding bills from these companies.  You will be contacted with the lab results as soon as they are available. The fastest way to get your results is to activate your My Chart account. Instructions are located on the last page of this paperwork. If you have not heard from [place] regarding the results in 2 weeks, please contact this office.        Return if symptoms worsen or fail to improve.   Ruben Reason, MD 11/21/2019

## 2019-12-05 ENCOUNTER — Other Ambulatory Visit: Payer: Self-pay

## 2019-12-05 ENCOUNTER — Ambulatory Visit
Admission: RE | Admit: 2019-12-05 | Discharge: 2019-12-05 | Disposition: A | Discharge status: Home / Self Care | Payer: Medicare HMO | Source: Ambulatory Visit | Attending: Family Medicine | Admitting: Family Medicine

## 2019-12-05 DIAGNOSIS — R0981 Nasal congestion: Secondary | ICD-10-CM

## 2019-12-05 DIAGNOSIS — R059 Cough, unspecified: Secondary | ICD-10-CM

## 2019-12-18 NOTE — Progress Notes (Signed)
Your recent CT of sinuses did not reveal any significant sinusitis.  I hope the specialists are able to be of assistance to you.  Ruben Reason MD

## 2019-12-22 ENCOUNTER — Encounter: Payer: Self-pay | Admitting: Radiology

## 2019-12-31 ENCOUNTER — Encounter: Payer: Self-pay | Admitting: Family Medicine

## 2019-12-31 ENCOUNTER — Telehealth (INDEPENDENT_AMBULATORY_CARE_PROVIDER_SITE_OTHER): Payer: Medicare HMO | Admitting: Family Medicine

## 2019-12-31 ENCOUNTER — Other Ambulatory Visit: Payer: Self-pay

## 2019-12-31 VITALS — Ht 72.0 in | Wt 300.0 lb

## 2019-12-31 DIAGNOSIS — R059 Cough, unspecified: Secondary | ICD-10-CM

## 2019-12-31 DIAGNOSIS — R0989 Other specified symptoms and signs involving the circulatory and respiratory systems: Secondary | ICD-10-CM

## 2019-12-31 DIAGNOSIS — R05 Cough: Secondary | ICD-10-CM | POA: Diagnosis not present

## 2019-12-31 DIAGNOSIS — R0982 Postnasal drip: Secondary | ICD-10-CM

## 2019-12-31 MED ORDER — BENZONATATE 100 MG PO CAPS: 100.0000 mg | ORAL_CAPSULE | Freq: Three times a day (TID) | ORAL | 0 refills | Status: DC | PRN

## 2019-12-31 MED ORDER — LEVOFLOXACIN 750 MG PO TABS: 750.0000 mg | ORAL_TABLET | Freq: Every day | ORAL | 0 refills | Status: DC

## 2019-12-31 NOTE — Progress Notes (Signed)
Virtual Visit via Video Note  I connected with Todd Harrison on 12/31/19 at 6:40 PM by a video enabled telemedicine application and verified that I am speaking with the correct person using two identifiers.  Patient location:home. My location: office.    I discussed the limitations, risks, security and privacy concerns of performing an evaluation and management service by telephone and the availability of in person appointments. I also discussed with the patient that there may be a patient responsible charge related to this service. The patient expressed understanding and agreed to proceed, consent obtained  12 min initial chart review prior to visit.   Chief complaint:  Chief Complaint  Patient presents with  . Cough    Pt reports he has had this same cough and chest congestion for months now. pt saw Dr. Linna Darner last for this. pt states the cough and congestion have been getting worse. pt has seen a ENT since then. cough is productive with a yellow or white mucus.pt states he sleeps with his upper body elevated, and follows what he was told by the providers who have treated him, but he isn't getting any better.Pt state he has an auto immune disease that he isn't sure if that is a factor in his cough or not.     History of Present Illness: Todd Harrison is a 74 y.o. male  Cough/chest congestion: Ongoing symptoms- see prior visits.   Most recently seen July 16 by Dr. Linna Darner.  Note reviewed.  Chronic cough.  Persistent since winter at that time.  Has been treated with antibiotics, inhalers, Singulair.  Reported that time that nothing had really helped his lungs significantly.  Also tried PPI.  Plan for maxillofacial CT to evaluate sinuses, and referred to ENT.  Also treated again with Augmentin 875 mg twice daily for 10 days, prednisone taper from 60mg  to 10 mg over the course of 10 days, and continued on omeprazole 40 mg daily.    Previous pulmonary eval June 14: Initial pulmonary  consult was December 2020 for ongoing cough after URI.  Pulmonary function testing June 12, 2019 with normal lung function, FEV1 87%, ratio 76, FVC 84%.  No significant bronchodilator response.  DLCO 87%.  Thought to have recurrent bronchitis.  PFTs were normal, chest x-ray without acute process.  Treated for possible asthma component and control for triggers.  Was continued on Advair twice daily, Singulair 10 mg nightly, Plan for Zyrtec 10 mg nightly, Mucinex DM twice daily as needed, and Prilosec was started at that time.  Sputum culture was ordered.  4 to 6-week follow-up with pulmonary planned.  Sputum culture June 18 with no white blood cells, few epithelial cells, few gram-positive cocci and bacilli, growth of normal oropharyngeal flora.  ENT eval July 27:.   At that time he was still taking Advair twice per day, reported constantly clearing his throat and some hoarseness.  Had stopped his PPI few weeks prior because it was incompatible with one of his medications for lichen planus of his feet.  Laryngoscopy performed.  Normal vocal cord mobility without nodule mass polyp or tumors.  Slight thickening of the true vocal folds and mild edema and erythema of the arytenoids and posterior commissure.  Hypopharynx was normal without mass pooling of secretions or aspiration.  He was restarted on famotidine 40 mg daily, plan to continue fluticasone nasal spray, added Atrovent nasal spray to decrease volume of drainage and plan for CT of sinuses as planned.  6-week recheck  Last chest x-ray June 11.  No evidence of acute cardiopulmonary abnormality. Maxillofacial CT on July 30: Paranasal sinuses were well aerated, no evidence of acute or chronic sinusitis.  Leftward nasal septal deviation.   Today feels like same cough that has been getting progressively worse over past 2-3 months.  Yellow sputum at times, past month, some green sputum past 2-3 days. More cough and cough at night.  No fever. Min shortness  of breath with less exercise, unable to walk with lichen planus - going on for months, no recent changes. No exposure to Covid 19. Had vaccine.  No sick contacts at home.  On cellcept for lichen planus  - no recent methotrexate (did not work). Treated by Dr. Elvera Lennox at [facility].  On omeprazole QHS, but will need to stop due to lichen planus med.  sleeping with upper body elevated. No heartburn.  flonase 2 sprays most days. atrovent ns 2 times per day. Constant PND still.  mucinex 1 tab BID.   Patient Active Problem List   Diagnosis Date Noted  . Asthmatic bronchitis 10/20/2019  . Methotrexate, long term, current use   . Cough present for greater than 3 weeks 04/30/2019  . Shortness of breath 04/30/2019  . Complicated acute bronchitis 04/30/2019  . Allergic contact dermatitis 09/09/2018  . Acquired hypothyroidism 06/09/2016  . Rotator cuff tear, right 09/22/2010  . METATARSALGIA 11/24/2008  . FOOT PAIN, LEFT 11/24/2008  . TALIPES CAVUS 11/24/2008   Past Medical History:  Diagnosis Date  . Lichen planus   . Methotrexate, long term, current use   . Psoriasis   . Thyroid disease    Past Surgical History:  Procedure Laterality Date  . CATARACT EXTRACTION Left    2019   Allergies  Allergen Reactions  . Gold Itching  . Mixed Grasses    Prior to Admission medications   Medication Sig Start Date End Date Taking? Authorizing Provider  acitretin (SORIATANE) 25 MG capsule Take 25 mg by mouth daily. 02/18/19  Yes [provider]  albuterol (VENTOLIN HFA) 108 (90 Base) MCG/ACT inhaler Inhale 2 puffs into the lungs every 6 (six) hours as needed for wheezing or shortness of breath. 10/17/19  Yes Posey Boyer, MD  amoxicillin-clavulanate (AUGMENTIN) 875-125 MG tablet Take 1 tablet by mouth 2 (two) times daily. 11/21/19  Yes Posey Boyer, MD  augmented betamethasone dipropionate (DIPROLENE-AF) 0.05 % cream  10/01/19  Yes [provider]  dextromethorphan 15  MG/5ML syrup Take 10 mLs (30 mg total) by mouth 4 (four) times daily as needed for cough. 03/31/19  Yes Maximiano Coss, NP  famotidine (PEPCID) 40 MG tablet Take by mouth. 12/02/19 03/01/20 Yes [provider]  Fluticasone-Salmeterol (ADVAIR DISKUS) 100-50 MCG/DOSE AEPB Inhale 1 puff into the lungs in the morning and at bedtime. 10/17/19  Yes Posey Boyer, MD  folic acid (FOLVITE) 1 MG tablet Take 1 mg by mouth daily. 07/08/18  Yes [provider]  hydrOXYzine (ATARAX/VISTARIL) 25 MG tablet Take 25 mg by mouth daily. 10/01/19  Yes [provider]  ipratropium (ATROVENT) 0.06 % nasal spray Place 2 sprays into both nostrils 3 (three) times daily. 12/02/19  Yes [provider]  ipratropium (ATROVENT) 0.06 % nasal spray Place into the nose. 12/02/19 01/01/20 Yes [provider]  levothyroxine (SYNTHROID, LEVOTHROID) 100 MCG tablet Take 100 mcg by mouth daily. 05/06/11  Yes [provider]  methotrexate (RHEUMATREX) 2.5 MG tablet  08/30/18  Yes [provider]  montelukast (SINGULAIR)  10 MG tablet Take 1 tablet (10 mg total) by mouth at bedtime. 10/17/19  Yes Posey Boyer, MD  mupirocin ointment Drue Stager) 2 %  04/28/19  Yes [provider]  mycophenolate (CELLCEPT) 500 MG tablet Take by mouth 2 (two) times daily.   Yes [provider]  omeprazole (PRILOSEC) 40 MG capsule Take 1 capsule (40 mg total) by mouth daily. 11/21/19  Yes Posey Boyer, MD  predniSONE (DELTASONE) 20 MG tablet Take 3 each morning for 2 days, then 2 for 2 days, then 1 for 2 days, then one half for 4 days. 11/21/19  Yes Posey Boyer, MD  triamcinolone cream (KENALOG) 0.1 %  08/29/18  Yes [provider]  triamcinolone ointment (KENALOG) 0.1 % APPLY TO AFFECTED AREA TWICE A DAY 08/12/19  Yes [provider]   Social History   Socioeconomic History  . Marital status: Married    Spouse name: Not on file  . Number of children: Not on  file  . Years of education: Not on file  . Highest education level: Not on file  Occupational History  . Not on file  Tobacco Use  . Smoking status: Former Smoker    Quit date: 1982    Years since quitting: 39.6  . Smokeless tobacco: Never Used  Vaping Use  . Vaping Use: Never assessed  Substance and Sexual Activity  . Alcohol use: Yes    Alcohol/week: 1.0 standard drink    Types: 1 Glasses of wine per week  . Drug use: No  . Sexual activity: Not on file  Other Topics Concern  . Not on file  Social History Narrative  . Not on file   Social Determinants of Health   Financial Resource Strain:   . Difficulty of Paying Living Expenses: Not on file  Food Insecurity:   . Worried About Charity fundraiser in the Last Year: Not on file  . Ran Out of Food in the Last Year: Not on file  Transportation Needs:   . Lack of Transportation (Medical): Not on file  . Lack of Transportation (Non-Medical): Not on file  Physical Activity:   . Days of Exercise per Week: Not on file  . Minutes of Exercise per Session: Not on file  Stress:   . Feeling of Stress : Not on file  Social Connections:   . Frequency of Communication with Friends and Family: Not on file  . Frequency of Social Gatherings with Friends and Family: Not on file  . Attends Religious Services: Not on file  . Active Member of Clubs or Organizations: Not on file  . Attends Archivist Meetings: Not on file  . Marital Status: Not on file  Intimate Partner Violence:   . Fear of Current or Ex-Partner: Not on file  . Emotionally Abused: Not on file  . Physically Abused: Not on file  . Sexually Abused: Not on file    Observations/Objective: Vitals:   12/31/19 1058  Weight: 300 lb (136.1 kg)  Height: 6' (1.829 m)   Speaking in full sentences without apparent respiratory distress, nontoxic appearing on video.  All questions answered with understanding of plan expressed   Assessment and Plan: Cough - Plan: DG  Chest 2 View, levofloxacin (LEVAQUIN) 750 MG tablet, benzonatate (TESSALON) 100 MG capsule  Chest congestion - Plan: levofloxacin (LEVAQUIN) 750 MG tablet, benzonatate (TESSALON) 100 MG capsule  PND (post-nasal drip)  Longstanding cough/congestion with recent worsening.  Reports productive yellow phlegm past  month, further changed to green phlegm past few days without apparent increased shortness of breath or fever.   -with recent worsening,concerning for secondary infection or pneumonia. Recommend urgent care eval tonight or tomorrow morning for eval, xray and possible bloodwork.   -Famotidine discussed if stopping omeprazole, potential impact on mycophenolate discussed along with other med impact on mycophenolate.  -Increase Atrovent nasal spray to decrease nasal congestion. postnasal drip as possible contributor to cough.  Continue Flonase daily.   -Tessalon Perles 3 times daily as needed for now for symptoms.   -Likely will need to follow-up with pulmonary for ongoing cough and congestion and to determine further work-up.  ER precautions given overnight. Depending on urgent care disposition, can call me or mychart message for pulmonary follow up plan.   Follow Up Instructions: Urgent care eval. I discussed the assessment and treatment plan with the patient. The patient was provided an opportunity to ask questions and all were answered. The patient agreed with the plan and demonstrated an understanding of the instructions.   The patient was advised to call back or seek an in-person evaluation if the symptoms worsen or if the condition fails to improve as anticipated.  I provided 32 minutes of non-face-to-face time during this encounter, and 8min review as above.    Wendie Agreste, MD

## 2019-12-31 NOTE — Patient Instructions (Addendum)
With recent worsening of cough and concern for possible pneumonia, I recommend Urgent care eval tonight or tomorrow morning as you may need xray, blood work or other testing and appropriate antibiotic can be decided at that time if needed.   Continue flonase daily, and can increase ipratropium nasal spray to 3 or 4 times per day to decrease nasal congestion to decrease postnasal drip.   If you need to stop omeprazole, then start famotidine (pepcid) at bedtime as recommended by ENT specialist (if ok with your current med for lichen planus - can ask dermatologist as famotidine can decrease effectiveness of the mycophenolate).   I would recommend pulmonary follow up. I can help arrange appointment if needed - let me know.    If you have lab work done today you will be contacted with your lab results within the next 2 weeks.  If you have not heard from [place] then please contact us. The fastest way to get your results is to register for My Chart.   IF you received an x-ray today, you will receive an invoice from [facility]. Please contact [facility] [place] [facility] at [phone] with questions or concerns regarding your invoice.   IF you received labwork today, you will receive an invoice from [place]. Please contact [facility] at [phone] with questions or concerns regarding your invoice.   Our billing staff will not be able to assist you with questions regarding bills from these companies.  You will be contacted with the lab results as soon as they are available. The fastest way to get your results is to activate your My Chart account. Instructions are located on the last page of this paperwork. If you have not heard from [place] regarding the results in 2 weeks, please contact this office.

## 2020-01-01 ENCOUNTER — Ambulatory Visit (INDEPENDENT_AMBULATORY_CARE_PROVIDER_SITE_OTHER): Payer: Medicare HMO

## 2020-01-01 ENCOUNTER — Other Ambulatory Visit: Payer: Self-pay

## 2020-01-01 ENCOUNTER — Ambulatory Visit
Admission: RE | Admit: 2020-01-01 | Discharge: 2020-01-01 | Disposition: A | Discharge status: Home / Self Care | Payer: Medicare HMO | Source: Ambulatory Visit | Attending: Family Medicine | Admitting: Family Medicine

## 2020-01-01 VITALS — BP 114/67 | HR 90 | Temp 99.0°F | Resp 22

## 2020-01-01 DIAGNOSIS — R059 Cough, unspecified: Secondary | ICD-10-CM

## 2020-01-01 DIAGNOSIS — R05 Cough: Secondary | ICD-10-CM

## 2020-01-01 DIAGNOSIS — R0682 Tachypnea, not elsewhere classified: Secondary | ICD-10-CM | POA: Diagnosis not present

## 2020-01-01 DIAGNOSIS — Z20822 Contact with and (suspected) exposure to covid-19: Secondary | ICD-10-CM | POA: Diagnosis not present

## 2020-01-01 DIAGNOSIS — J189 Pneumonia, unspecified organism: Secondary | ICD-10-CM | POA: Diagnosis not present

## 2020-01-01 MED ORDER — PREDNISONE 10 MG (21) PO TBPK: ORAL_TABLET | Freq: Every day | ORAL | 0 refills | Status: AC

## 2020-01-01 MED ORDER — LEVOFLOXACIN 750 MG PO TABS: 750.0000 mg | ORAL_TABLET | Freq: Every day | ORAL | 0 refills | Status: DC

## 2020-01-01 NOTE — ED Triage Notes (Signed)
Pt presents with complaints of cough. Reports that he has had ongoing issues for 7-8 months. States that he started coughing again in the last month, his doctor recommend he come here for a chest x ray and blood work to rule out pneumonia. He recently started coughing up green and yellow mucus. Pt denies any other symptoms.

## 2020-01-01 NOTE — Discharge Instructions (Addendum)
It appears on your xray that you have pneumonia  I have sent in Levaquin 750mg  once daily for 7 days  I have sent in a prednisone taper for you to take for 6 days. 6 tablets on day one, 5 tablets on day two, 4 tablets on day three, 3 tablets on day four, 2 tablets on day five, and 1 tablet on day six.  Follow up with primary care or with pulmonology

## 2020-01-02 LAB — COMPREHENSIVE METABOLIC PANEL
ALT: 13 IU/L (ref 0–44)
AST: 17 IU/L (ref 0–40)
Albumin/Globulin Ratio: 1.7 (ref 1.2–2.2)
Albumin: 3.8 g/dL (ref 3.7–4.7)
Alkaline Phosphatase: 73 IU/L (ref 48–121)
BUN/Creatinine Ratio: 8 — ABNORMAL LOW (ref 10–24)
BUN: 9 mg/dL (ref 8–27)
Bilirubin Total: 0.8 mg/dL (ref 0.0–1.2)
CO2: 20 mmol/L (ref 20–29)
Calcium: 8.8 mg/dL (ref 8.6–10.2)
Chloride: 102 mmol/L (ref 96–106)
Creatinine, Ser: 1.11 mg/dL (ref 0.76–1.27)
GFR calc Af Amer: 77 mL/min/{1.73_m2} (ref >59)
GFR calc non Af Amer: 66 mL/min/{1.73_m2} (ref >59)
Globulin, Total: 2.3 g/dL (ref 1.5–4.5)
Glucose: 112 mg/dL — ABNORMAL HIGH (ref 65–99)
Potassium: 3.8 mmol/L (ref 3.5–5.2)
Sodium: 136 mmol/L (ref 134–144)
Total Protein: 6.1 g/dL (ref 6.0–8.5)

## 2020-01-02 LAB — CBC WITH DIFFERENTIAL/PLATELET
Basophils Absolute: 0.1 10*3/uL (ref 0.0–0.2)
Basos: 1 %
EOS (ABSOLUTE): 0.1 10*3/uL (ref 0.0–0.4)
Eos: 2 %
Hematocrit: 42.7 % (ref 37.5–51.0)
Hemoglobin: 13.8 g/dL (ref 13.0–17.7)
Immature Grans (Abs): 0 10*3/uL (ref 0.0–0.1)
Immature Granulocytes: 0 %
Lymphocytes Absolute: 1.4 10*3/uL (ref 0.7–3.1)
Lymphs: 19 %
MCH: 28.6 pg (ref 26.6–33.0)
MCHC: 32.3 g/dL (ref 31.5–35.7)
MCV: 88 fL (ref 79–97)
Monocytes Absolute: 0.9 10*3/uL (ref 0.1–0.9)
Monocytes: 12 %
Neutrophils Absolute: 4.9 10*3/uL (ref 1.4–7.0)
Neutrophils: 66 %
Platelets: 275 10*3/uL (ref 150–450)
RBC: 4.83 x10E6/uL (ref 4.14–5.80)
RDW: 12.8 % (ref 11.6–15.4)
WBC: 7.4 10*3/uL (ref 3.4–10.8)

## 2020-01-02 LAB — SARS-COV-2, NAA 2 DAY TAT

## 2020-01-02 LAB — NOVEL CORONAVIRUS, NAA: SARS-CoV-2, NAA: NOT DETECTED

## 2020-01-02 NOTE — ED Provider Notes (Signed)
[place]   [id number] 01/01/20 Arrival Time: 3716   CC: COVID symptoms  SUBJECTIVE: History from: patient.  Todd Harrison is a 74 y.o. male who presents with cough for the last almost 7 months.  Reports that he had a video visit with his primary care provider yesterday.  Upon chart review, patient has been on Augmentin, Levaquin 500 mg to treat for possible pneumonia with primary care.  Patient reports that he will feel better for a little bit on his medications and then he is coughing and tired again right after he is done.  Per primary care provider note, he wanted the patient to be seen and evaluated in urgent care for possible lab work and chest x-ray.  Has negative history of Covid.  Has had Covid vaccines.. Denies sick exposure to COVID, flu or strep. Denies recent travel. There are no aggravating or alleviating factors. Denies previous symptoms in the past. Denies fever, chills, sinus pain, rhinorrhea, sore throat, wheezing, chest pain, nausea, changes in bowel or bladder habits.    ROS: As per HPI.  All other pertinent ROS negative.     Past Medical History:  Diagnosis Date  . Lichen planus   . Methotrexate, long term, current use   . Psoriasis   . Thyroid disease    Past Surgical History:  Procedure Laterality Date  . CATARACT EXTRACTION Left    2019   Allergies  Allergen Reactions  . Gold Itching  . Mixed Grasses    Current Facility-Administered Medications on File Prior to Encounter  Medication Dose Route Frequency Provider Last Rate Last Admin  . methylPREDNISolone acetate (DEPO-MEDROL) injection 40 mg  40 mg Intra-articular Once Claris Gower, MD       Current Outpatient Medications on File Prior to Encounter  Medication Sig Dispense Refill  . acitretin (SORIATANE) 25 MG capsule Take 25 mg by mouth daily.    Marland Kitchen albuterol (VENTOLIN HFA) 108 (90 Base) MCG/ACT inhaler Inhale 2 puffs into the lungs every 6 (six) hours as needed for wheezing or  shortness of breath. 18 g 3  . amoxicillin-clavulanate (AUGMENTIN) 875-125 MG tablet Take 1 tablet by mouth 2 (two) times daily. 20 tablet 0  . augmented betamethasone dipropionate (DIPROLENE-AF) 0.05 % cream     . benzonatate (TESSALON) 100 MG capsule Take 1 capsule (100 mg total) by mouth 3 (three) times daily as needed for cough. 30 capsule 0  . dextromethorphan 15 MG/5ML syrup Take 10 mLs (30 mg total) by mouth 4 (four) times daily as needed for cough. 120 mL 0  . famotidine (PEPCID) 40 MG tablet Take by mouth.    . Fluticasone-Salmeterol (ADVAIR DISKUS) 100-50 MCG/DOSE AEPB Inhale 1 puff into the lungs in the morning and at bedtime. 60 each 2  . folic acid (FOLVITE) 1 MG tablet Take 1 mg by mouth daily.    . hydrOXYzine (ATARAX/VISTARIL) 25 MG tablet Take 25 mg by mouth daily.    Marland Kitchen ipratropium (ATROVENT) 0.06 % nasal spray Place 2 sprays into both nostrils 3 (three) times daily.    Marland Kitchen ipratropium (ATROVENT) 0.06 % nasal spray Place into the nose.    . levothyroxine (SYNTHROID, LEVOTHROID) 100 MCG tablet Take 100 mcg by mouth daily.    . methotrexate (RHEUMATREX) 2.5 MG tablet     . montelukast (SINGULAIR) 10 MG tablet Take 1 tablet (10 mg total) by mouth at bedtime. 30 tablet 3  . mupirocin ointment (BACTROBAN) 2 %     .  mycophenolate (CELLCEPT) 500 MG tablet Take by mouth 2 (two) times daily.    Marland Kitchen omeprazole (PRILOSEC) 40 MG capsule Take 1 capsule (40 mg total) by mouth daily. 30 capsule 3  . triamcinolone cream (KENALOG) 0.1 %     . triamcinolone ointment (KENALOG) 0.1 % APPLY TO AFFECTED AREA TWICE A DAY     Social History   Socioeconomic History  . Marital status: Married    Spouse name: Not on file  . Number of children: Not on file  . Years of education: Not on file  . Highest education level: Not on file  Occupational History  . Not on file  Tobacco Use  . Smoking status: Former Smoker    Quit date: 1982    Years since quitting: 39.6  . Smokeless tobacco: Never Used    Vaping Use  . Vaping Use: Never assessed  Substance and Sexual Activity  . Alcohol use: Yes    Alcohol/week: 1.0 standard drink    Types: 1 Glasses of wine per week  . Drug use: No  . Sexual activity: Not on file  Other Topics Concern  . Not on file  Social History Narrative  . Not on file   Social Determinants of Health   Financial Resource Strain:   . Difficulty of Paying Living Expenses: Not on file  Food Insecurity:   . Worried About Charity fundraiser in the Last Year: Not on file  . Ran Out of Food in the Last Year: Not on file  Transportation Needs:   . Lack of Transportation (Medical): Not on file  . Lack of Transportation (Non-Medical): Not on file  Physical Activity:   . Days of Exercise per Week: Not on file  . Minutes of Exercise per Session: Not on file  Stress:   . Feeling of Stress : Not on file  Social Connections:   . Frequency of Communication with Friends and Family: Not on file  . Frequency of Social Gatherings with Friends and Family: Not on file  . Attends Religious Services: Not on file  . Active Member of Clubs or Organizations: Not on file  . Attends Archivist Meetings: Not on file  . Marital Status: Not on file  Intimate Partner Violence:   . Fear of Current or Ex-Partner: Not on file  . Emotionally Abused: Not on file  . Physically Abused: Not on file  . Sexually Abused: Not on file   Family History  Problem Relation Age of Onset  . Diabetes Mother   . Stroke Mother     OBJECTIVE:  Vitals:   01/01/20 1318  BP: 114/67  Pulse: 90  Resp: (!) 22  Temp: 99 F (37.2 C)  SpO2: 91%     General appearance: alert; appears fatigued, but nontoxic; speaking in full sentences and tolerating own secretions HEENT: NCAT; Ears: EACs clear, TMs pearly gray; Eyes: PERRL.  EOM grossly intact. Sinuses: nontender; Nose: nares patent without rhinorrhea, Throat: oropharynx clear, tonsils non erythematous or enlarged, uvula midline  Neck:  supple without LAD Lungs: unlabored respirations, symmetrical air entry; cough: moderate; no respiratory distress; diminished lung sounds in bilateral bases Heart: regular rate and rhythm.  Radial pulses 2+ symmetrical bilaterally Skin: warm and dry Psychological: alert and cooperative; normal mood and affect  LABS:  Results for orders placed or performed during the hospital encounter of 01/01/20 (from the past 24 hour(s))  CBC with Differential/Platelet     Status: None   Collection Time:  01/01/20  1:59 PM  Result Value Ref Range   WBC 7.4 3.4 - 10.8 x10E3/uL   RBC 4.83 4.14 - 5.80 x10E6/uL   Hemoglobin 13.8 13.0 - 17.7 g/dL   Hematocrit 42.7 37.5 - 51.0 %   MCV 88 79 - 97 fL   MCH 28.6 26.6 - 33.0 pg   MCHC 32.3 31 - 35 g/dL   RDW 12.8 11.6 - 15.4 %   Platelets 275 150 - 450 x10E3/uL   Neutrophils 66 Not Estab. %   Lymphs 19 Not Estab. %   Monocytes 12 Not Estab. %   Eos 2 Not Estab. %   Basos 1 Not Estab. %   Neutrophils Absolute 4.9 1 - 7 x10E3/uL   Lymphocytes Absolute 1.4 0 - 3 x10E3/uL   Monocytes Absolute 0.9 0 - 0 x10E3/uL   EOS (ABSOLUTE) 0.1 0.0 - 0.4 x10E3/uL   Basophils Absolute 0.1 0 - 0 x10E3/uL   Immature Granulocytes 0 Not Estab. %   Immature Grans (Abs) 0.0 0.0 - 0.1 x10E3/uL   Narrative   Performed at:  [address] [place], [place]  [id number] Lab Director: Rush Farmer MD, Phone:  [phone]  Comprehensive metabolic panel     Status: Abnormal   Collection Time: 01/01/20  1:59 PM  Result Value Ref Range   Glucose 112 (H) 65 - 99 mg/dL   BUN 9 8 - 27 mg/dL   Creatinine, Ser 1.11 0.76 - 1.27 mg/dL   GFR calc non Af Amer 66 >59 mL/min/1.73   GFR calc Af Amer 77 >59 mL/min/1.73   BUN/Creatinine Ratio 8 (L) 10 - 24   Sodium 136 134 - 144 mmol/L   Potassium 3.8 3.5 - 5.2 mmol/L   Chloride 102 96 - 106 mmol/L   CO2 20 20 - 29 mmol/L   Calcium 8.8 8.6 - 10.2 mg/dL   Total Protein 6.1 6.0 - 8.5 g/dL   Albumin 3.8 3.7 - 4.7  g/dL   Globulin, Total 2.3 1.5 - 4.5 g/dL   Albumin/Globulin Ratio 1.7 1.2 - 2.2   Bilirubin Total 0.8 0.0 - 1.2 mg/dL   Alkaline Phosphatase 73 48 - 121 IU/L   AST 17 0 - 40 IU/L   ALT 13 0 - 44 IU/L   Narrative   Performed at:  [address] [place], [place]  [id number] Lab Director: Rush Farmer MD, Phone:  [phone]     ASSESSMENT & PLAN:  1. Pneumonia due to infectious organism, unspecified laterality, unspecified part of lung   2. Cough   3. Tachypnea     Meds ordered this encounter  Medications  . levofloxacin (LEVAQUIN) 750 MG tablet    Sig: Take 1 tablet (750 mg total) by mouth daily.    Dispense:  7 tablet    Refill:  0    Order Specific Question:   Supervising Provider    Answer:   Chase Picket [id number]  . predniSONE (STERAPRED UNI-PAK 21 TAB) 10 MG (21) TBPK tablet    Sig: Take by mouth daily for 6 days. Take 6 tablets on day 1, 5 tablets on day 2, 4 tablets on day 3, 3 tablets on day 4, 2 tablets on day 5, 1 tablet on day 6    Dispense:  21 tablet    Refill:  0    Order Specific Question:   Supervising Provider    Answer:   Chase Picket [id number]  Chest x-ray shows likely infectious pneumonia Prescribe Levaquin 750 mg once daily for 7 days Prescription steroid taper Follow-up for x-ray to check for resolution of symptoms in 4 weeks COVID testing ordered.  It will take between 1-2 days for test results.  Someone will contact you regarding abnormal results.    Patient should remain in quarantine until they have received Covid results.  If negative you may resume normal activities (go back to work/school) while practicing hand hygiene, social distance, and mask wearing.  If positive, patient should remain in quarantine for 10 days from symptom onset AND greater than 72 hours after symptoms resolution (absence of fever without the use of fever-reducing medication and improvement in respiratory symptoms), whichever is  longer Get plenty of rest and push fluids Use OTC zyrtec for nasal congestion, runny nose, and/or sore throat Use OTC flonase for nasal congestion and runny nose Use medications daily for symptom relief Use OTC medications like ibuprofen or tylenol as needed fever or pain Call or go to the ED if you have any new or worsening symptoms such as fever, worsening cough, shortness of breath, chest tightness, chest pain, turning blue, changes in mental status.  Reviewed expectations re: course of current medical issues. Questions answered. Outlined signs and symptoms indicating need for more acute intervention. Patient verbalized understanding. After Visit Summary given.         Faustino Congress, NP 01/02/20 1231

## 2020-01-04 ENCOUNTER — Other Ambulatory Visit: Payer: Self-pay | Admitting: Family Medicine

## 2020-01-04 DIAGNOSIS — R059 Cough, unspecified: Secondary | ICD-10-CM

## 2020-01-04 DIAGNOSIS — R0989 Other specified symptoms and signs involving the circulatory and respiratory systems: Secondary | ICD-10-CM

## 2020-01-04 DIAGNOSIS — R058 Other specified cough: Secondary | ICD-10-CM

## 2020-01-07 ENCOUNTER — Ambulatory Visit: Payer: Medicare HMO | Admitting: Pulmonary Disease

## 2020-01-07 ENCOUNTER — Other Ambulatory Visit: Payer: Self-pay | Admitting: Family Medicine

## 2020-01-07 DIAGNOSIS — R0989 Other specified symptoms and signs involving the circulatory and respiratory systems: Secondary | ICD-10-CM

## 2020-01-07 DIAGNOSIS — R062 Wheezing: Secondary | ICD-10-CM

## 2020-01-07 DIAGNOSIS — R059 Cough, unspecified: Secondary | ICD-10-CM

## 2020-01-08 ENCOUNTER — Other Ambulatory Visit: Payer: Self-pay

## 2020-01-08 ENCOUNTER — Ambulatory Visit: Payer: Medicare HMO | Admitting: Adult Health

## 2020-01-08 ENCOUNTER — Encounter: Payer: Self-pay | Admitting: Adult Health

## 2020-01-08 VITALS — BP 108/72 | HR 67 | Temp 97.5°F | Ht 72.0 in | Wt 307.2 lb

## 2020-01-08 DIAGNOSIS — J181 Lobar pneumonia, unspecified organism: Secondary | ICD-10-CM

## 2020-01-08 DIAGNOSIS — J4541 Moderate persistent asthma with (acute) exacerbation: Secondary | ICD-10-CM

## 2020-01-08 DIAGNOSIS — R05 Cough: Secondary | ICD-10-CM | POA: Diagnosis not present

## 2020-01-08 DIAGNOSIS — R053 Chronic cough: Secondary | ICD-10-CM

## 2020-01-08 DIAGNOSIS — J189 Pneumonia, unspecified organism: Secondary | ICD-10-CM | POA: Diagnosis not present

## 2020-01-08 MED ORDER — ALBUTEROL SULFATE (2.5 MG/3ML) 0.083% IN NEBU: 2.5000 mg | INHALATION_SOLUTION | Freq: Four times a day (QID) | RESPIRATORY_TRACT | 5 refills | Status: DC | PRN

## 2020-01-08 MED ORDER — PREDNISONE 10 MG PO TABS: ORAL_TABLET | ORAL | 0 refills | Status: DC

## 2020-01-08 MED ORDER — BUDESONIDE-FORMOTEROL FUMARATE 160-4.5 MCG/ACT IN AERO: 2.0000 | INHALATION_SPRAY | Freq: Two times a day (BID) | RESPIRATORY_TRACT | 12 refills | Status: DC

## 2020-01-08 NOTE — Patient Instructions (Addendum)
Remain off Advair.  Begin Symbicort 160 2 puffs Twice daily  , rinse after use.  Albuterol inhaler As needed  Wheezing  Prednisone taper over next week.  Add Albuterol Nebs every 6hr as needed for wheezing .  Continue on Singulair 10mg  At bedtime .  Zyrtec 10mg  At bedtime.  Mucinex DM Twice daily  As needed  Cough/congestion  Change Pepcid 20mg  Twice daily.  CT chest w/ contrast in 2 weeks .  Follow up with Dr. Valeta Harms in 2 weeks and As needed   Please contact office for sooner follow up if symptoms do not improve or worsen or seek emergency care

## 2020-01-08 NOTE — Progress Notes (Signed)
@Patient  ID: Todd Harrison, male    DOB: 12/30/1945, 74 y.o.   MRN: [id number]  No chief complaint on file.   Referring provider: Maximiano Coss, NP  HPI: 74 year old male former smoker seen for pulmonary consult April 15, 2019 for cough felt to be consistent with mild persistent asthma, upper airway cough possibly secondary to chronic rhinitis and GERD Patient is a Engineer, technical sales Medical history significant for lichen planus currently on CellCept   Previously on methotrexate.  TEST/EVENTS :   June 12, 2019 that showed normal lung function with FEV1 at 87%, ratio 76, FVC 84%.  No significant bronchodilator response.  DLCO was 87%.   Chest x-ray done October 17, 2019 showed no acute process.  CT sinus December 06, 2019 paranasal sinuses well aerated.  No evidence of sinusitis.  Nasal septal deviation leftward  Sputum culture October 24, 2019 growth of normal oral flora.  01/08/2020 Follow up : Asthma , ER f/up  Patient returns for a 1 week ER follow-up.  Patient was seen in the emergency room on January 01, 2020.  He complained of ongoing cough that is been present for over 9 months.  He has been treated with several antibiotics without resolution of symptoms. Chest x-ray January 01, 2020 new reticular nodular opacities along the left mid lower lobe suspicious for infection.  Increased diffuse interstitial prominence.  COVID-19 testing was negative.  White blood cell count was normal.  absolute eosinophil count 100 He was treated with Levaquin 750 mg x 7 days.  And a prednisone taper. Patient says he feels only slightly better. Stopped taking Advair as felt not helping  Trying to work fulltime- buisness/construction   Allergies  Allergen Reactions   Gold Itching   Mixed Grasses     Immunization History  Administered Date(s) Administered   Fluad Quad(high Dose 65+) 03/04/2019   Influenza-Unspecified 03/08/2016    Past Medical History:  Diagnosis Date   Lichen planus     Methotrexate, long term, current use    Psoriasis    Thyroid disease     Tobacco History: Social History   Tobacco Use  Smoking Status Former Smoker   Quit date: 1982   Years since quitting: 39.6  Smokeless Tobacco Never Used   Counseling given: Not Answered   Outpatient Medications Prior to Visit  Medication Sig Dispense Refill   acitretin (SORIATANE) 25 MG capsule Take 25 mg by mouth daily.     ADVAIR DISKUS 100-50 MCG/DOSE AEPB INHALE 1 PUFF INTO THE LUNGS IN THE MORNING AND AT BEDTIME. 60 each 2   albuterol (VENTOLIN HFA) 108 (90 Base) MCG/ACT inhaler Inhale 2 puffs into the lungs every 6 (six) hours as needed for wheezing or shortness of breath. 18 g 3   amoxicillin-clavulanate (AUGMENTIN) 875-125 MG tablet Take 1 tablet by mouth 2 (two) times daily. 20 tablet 0   augmented betamethasone dipropionate (DIPROLENE-AF) 0.05 % cream      benzonatate (TESSALON) 100 MG capsule Take 1 capsule (100 mg total) by mouth 3 (three) times daily as needed for cough. 30 capsule 0   dextromethorphan 15 MG/5ML syrup Take 10 mLs (30 mg total) by mouth 4 (four) times daily as needed for cough. 120 mL 0   famotidine (PEPCID) 40 MG tablet Take by mouth.     folic acid (FOLVITE) 1 MG tablet Take 1 mg by mouth daily.     hydrOXYzine (ATARAX/VISTARIL) 25 MG tablet Take 25 mg by mouth daily.     ipratropium (  ATROVENT) 0.06 % nasal spray Place 2 sprays into both nostrils 3 (three) times daily.     ipratropium (ATROVENT) 0.06 % nasal spray Place into the nose.     levofloxacin (LEVAQUIN) 750 MG tablet Take 1 tablet (750 mg total) by mouth daily. 7 tablet 0   levothyroxine (SYNTHROID, LEVOTHROID) 100 MCG tablet Take 100 mcg by mouth daily.     methotrexate (RHEUMATREX) 2.5 MG tablet      montelukast (SINGULAIR) 10 MG tablet TAKE 1 TABLET BY MOUTH EVERYDAY AT BEDTIME 90 tablet 1   mupirocin ointment (BACTROBAN) 2 %      mycophenolate (CELLCEPT) 500 MG tablet Take by mouth 2 (two)  times daily.     omeprazole (PRILOSEC) 40 MG capsule Take 1 capsule (40 mg total) by mouth daily. 30 capsule 3   triamcinolone cream (KENALOG) 0.1 %      triamcinolone ointment (KENALOG) 0.1 % APPLY TO AFFECTED AREA TWICE A DAY     Facility-Administered Medications Prior to Visit  Medication Dose Route Frequency Provider Last Rate Last Admin   methylPREDNISolone acetate (DEPO-MEDROL) injection 40 mg  40 mg Intra-articular Once Claris Gower, MD         Review of Systems:   Constitutional:   No  weight loss, night sweats,  Fevers, chills,  +fatigue, or  lassitude.  HEENT:   No headaches,  Difficulty swallowing,  Tooth/dental problems, or  Sore throat,                No sneezing, itching, ear ache,  +nasal congestion, post nasal drip,   CV:  No chest pain,  Orthopnea, PND, swelling in lower extremities, anasarca, dizziness, palpitations, syncope.   GI  +heartburn, indigestion, no abdominal pain, nausea, vomiting, diarrhea, change in bowel habits, loss of appetite, bloody stools.   Resp:  .  No chest wall deformity  Skin: no rash or lesions.  GU: no dysuria, change in color of urine, no urgency or frequency.  No flank pain, no hematuria   MS:  No joint pain or swelling.  No decreased range of motion.  No back pain.    Physical Exam    GEN: A/Ox3; pleasant , NAD, well nourished    HEENT:  [place]/AT,  NOSE-clear, THROAT-clear, no lesions, no postnasal drip or exudate noted.   NECK:  Supple w/ fair ROM; no JVD; normal carotid impulses w/o bruits; no thyromegaly or nodules palpated; no lymphadenopathy.    RESP  Exp wheezing , no stridor . Speaks in full sentences   no accessory muscle use, no dullness to percussion  CARD:  RRR, no m/r/g, no peripheral edema, pulses intact, no cyanosis or clubbing.  GI:   Soft & nt; nml bowel sounds; no organomegaly or masses detected.   Musco: Warm bil, no deformities or joint swelling noted.   Neuro: alert, no focal deficits noted.     Skin: Warm, no lesions or rashes    Lab Results:  CBC    Component Value Date/Time   WBC 7.4 01/01/2020 1359   WBC 7.2 08/10/2016 1123   RBC 4.83 01/01/2020 1359   RBC 4.75 08/10/2016 1123   HGB 13.8 01/01/2020 1359   HCT 42.7 01/01/2020 1359   PLT 275 01/01/2020 1359   MCV 88 01/01/2020 1359   MCH 28.6 01/01/2020 1359   MCH 31.2 08/10/2016 1123   MCHC 32.3 01/01/2020 1359   MCHC 35.0 08/10/2016 1123   RDW 12.8 01/01/2020 1359   LYMPHSABS 1.4 01/01/2020 1359  EOSABS 0.1 01/01/2020 1359   BASOSABS 0.1 01/01/2020 1359    BMET    Component Value Date/Time   NA 136 01/01/2020 1359   K 3.8 01/01/2020 1359   CL 102 01/01/2020 1359   CO2 20 01/01/2020 1359   GLUCOSE 112 (H) 01/01/2020 1359   BUN 9 01/01/2020 1359   CREATININE 1.11 01/01/2020 1359   CALCIUM 8.8 01/01/2020 1359   GFRNONAA 66 01/01/2020 1359   GFRAA 77 01/01/2020 1359    BNP No results found for: BNP  ProBNP No results found for: PROBNP  Imaging: DG Chest 2 View  Result Date: 01/01/2020 CLINICAL DATA:  Cough EXAM: CHEST - 2 VIEW COMPARISON:  October 17, 2019 FINDINGS: The cardiomediastinal silhouette is unchanged in contour.Tortuous thoracic aorta. No pleural effusion. No pneumothorax. Coarse reticular opacities bilaterally. Are more focal reticulonodular opacities within the LEFT mid lower lobe, new since prior. Visualized abdomen is unremarkable. Multilevel degenerative changes of the thoracic spine. Unchanged wedging of a vertebral body of the inferior thoracic spine. IMPRESSION: 1. New reticulonodular opacities within the LEFT mid lower lobe, suspicious for infection. Recommend follow-up radiograph in 4-6 weeks to assess for resolution. 2. Diffuse interstitial prominence may reflect underlying pulmonary fibrosis or emphysema. Electronically Signed   By: Valentino Saxon MD   On: 01/01/2020 13:59      PFT Results Latest Ref Rng & Units 06/12/2019  FVC-Pre L 4.00  FVC-Predicted Pre % 84  FVC-Post  L 4.07  FVC-Predicted Post % 86  Pre FEV1/FVC % % 76  Post FEV1/FCV % % 73  FEV1-Pre L 3.04  FEV1-Predicted Pre % 87  FEV1-Post L 2.98  DLCO uncorrected ml/min/mmHg 24.05  DLCO UNC% % 87  DLVA Predicted % 94  TLC L 6.46  TLC % Predicted % 86  RV % Predicted % 87    No results found for: NITRICOXIDE      Assessment & Plan:   No problem-specific Assessment & Plan notes found for this encounter.     Rexene Edison, NP 01/08/2020

## 2020-01-09 DIAGNOSIS — J189 Pneumonia, unspecified organism: Secondary | ICD-10-CM | POA: Insufficient documentation

## 2020-01-09 NOTE — Assessment & Plan Note (Signed)
Recurrent exacerbations -slow to resolve   Plan  Patient Instructions  Remain off Advair.  Begin Symbicort 160 2 puffs Twice daily  , rinse after use.  Albuterol inhaler As needed  Wheezing  Prednisone taper over next week.  Add Albuterol Nebs every 6hr as needed for wheezing .  Continue on Singulair 10mg  At bedtime .  Zyrtec 10mg  At bedtime.  Mucinex DM Twice daily  As needed  Cough/congestion  Change Pepcid 20mg  Twice daily.  CT chest w/ contrast in 2 weeks .  Follow up with Dr. Valeta Harms in 2 weeks and As needed   Please contact office for sooner follow up if symptoms do not improve or worsen or seek emergency care

## 2020-01-09 NOTE — Assessment & Plan Note (Signed)
Left sided PNA - slow to resolve -complete antibiotic course - hold on additional abx at this  Increase pulmonary hygeien  Will need follow up CT chest in couple of weeks   Plan  Patient Instructions  Remain off Advair.  Begin Symbicort 160 2 puffs Twice daily  , rinse after use.  Albuterol inhaler As needed  Wheezing  Prednisone taper over next week.  Add Albuterol Nebs every 6hr as needed for wheezing .  Continue on Singulair 10mg  At bedtime .  Zyrtec 10mg  At bedtime.  Mucinex DM Twice daily  As needed  Cough/congestion  Change Pepcid 20mg  Twice daily.  CT chest w/ contrast in 2 weeks .  Follow up with Todd Harrison in 2 weeks and As needed   Please contact office for sooner follow up if symptoms do not improve or worsen or seek emergency care

## 2020-01-12 NOTE — Progress Notes (Signed)
Thanks for seeing him  Garner Nash, DO [facility] 01/12/2020 12:34 PM

## 2020-01-22 ENCOUNTER — Inpatient Hospital Stay (HOSPITAL_COMMUNITY): Payer: Medicare HMO

## 2020-01-22 ENCOUNTER — Emergency Department (HOSPITAL_COMMUNITY): Payer: Medicare HMO

## 2020-01-22 ENCOUNTER — Inpatient Hospital Stay (HOSPITAL_COMMUNITY)
Admission: EM | Admit: 2020-01-22 | Discharge: 2020-01-27 | DRG: 309 | Disposition: A | Discharge status: Home Health | Payer: Medicare HMO | Attending: Family Medicine | Admitting: Family Medicine

## 2020-01-22 ENCOUNTER — Other Ambulatory Visit: Payer: Self-pay

## 2020-01-22 ENCOUNTER — Encounter: Payer: Self-pay | Admitting: Adult Health

## 2020-01-22 ENCOUNTER — Ambulatory Visit: Payer: Medicare HMO | Admitting: Adult Health

## 2020-01-22 VITALS — BP 118/64 | HR 139 | Temp 98.1°F | Ht 72.0 in | Wt 299.6 lb

## 2020-01-22 DIAGNOSIS — I358 Other nonrheumatic aortic valve disorders: Secondary | ICD-10-CM | POA: Diagnosis present

## 2020-01-22 DIAGNOSIS — J329 Chronic sinusitis, unspecified: Secondary | ICD-10-CM | POA: Diagnosis present

## 2020-01-22 DIAGNOSIS — N182 Chronic kidney disease, stage 2 (mild): Secondary | ICD-10-CM | POA: Diagnosis present

## 2020-01-22 DIAGNOSIS — J4541 Moderate persistent asthma with (acute) exacerbation: Secondary | ICD-10-CM | POA: Diagnosis not present

## 2020-01-22 DIAGNOSIS — K219 Gastro-esophageal reflux disease without esophagitis: Secondary | ICD-10-CM | POA: Diagnosis present

## 2020-01-22 DIAGNOSIS — R0602 Shortness of breath: Secondary | ICD-10-CM | POA: Diagnosis not present

## 2020-01-22 DIAGNOSIS — Z79899 Other long term (current) drug therapy: Secondary | ICD-10-CM | POA: Diagnosis not present

## 2020-01-22 DIAGNOSIS — Z20822 Contact with and (suspected) exposure to covid-19: Secondary | ICD-10-CM | POA: Diagnosis present

## 2020-01-22 DIAGNOSIS — I4891 Unspecified atrial fibrillation: Principal | ICD-10-CM

## 2020-01-22 DIAGNOSIS — D84821 Immunodeficiency due to drugs: Secondary | ICD-10-CM | POA: Diagnosis present

## 2020-01-22 DIAGNOSIS — J4530 Mild persistent asthma, uncomplicated: Secondary | ICD-10-CM | POA: Diagnosis present

## 2020-01-22 DIAGNOSIS — J189 Pneumonia, unspecified organism: Secondary | ICD-10-CM | POA: Diagnosis not present

## 2020-01-22 DIAGNOSIS — Z79890 Hormone replacement therapy (postmenopausal): Secondary | ICD-10-CM

## 2020-01-22 DIAGNOSIS — Z9109 Other allergy status, other than to drugs and biological substances: Secondary | ICD-10-CM | POA: Diagnosis not present

## 2020-01-22 DIAGNOSIS — Z6841 Body Mass Index (BMI) 40.0 and over, adult: Secondary | ICD-10-CM

## 2020-01-22 DIAGNOSIS — Z7951 Long term (current) use of inhaled steroids: Secondary | ICD-10-CM | POA: Diagnosis not present

## 2020-01-22 DIAGNOSIS — L409 Psoriasis, unspecified: Secondary | ICD-10-CM | POA: Diagnosis present

## 2020-01-22 DIAGNOSIS — R000 Tachycardia, unspecified: Secondary | ICD-10-CM | POA: Diagnosis not present

## 2020-01-22 DIAGNOSIS — Z23 Encounter for immunization: Secondary | ICD-10-CM

## 2020-01-22 DIAGNOSIS — I340 Nonrheumatic mitral (valve) insufficiency: Secondary | ICD-10-CM | POA: Diagnosis not present

## 2020-01-22 DIAGNOSIS — E039 Hypothyroidism, unspecified: Secondary | ICD-10-CM | POA: Diagnosis present

## 2020-01-22 DIAGNOSIS — L439 Lichen planus, unspecified: Secondary | ICD-10-CM | POA: Diagnosis present

## 2020-01-22 DIAGNOSIS — Z87891 Personal history of nicotine dependence: Secondary | ICD-10-CM | POA: Diagnosis not present

## 2020-01-22 DIAGNOSIS — J811 Chronic pulmonary edema: Secondary | ICD-10-CM | POA: Diagnosis present

## 2020-01-22 DIAGNOSIS — J310 Chronic rhinitis: Secondary | ICD-10-CM | POA: Diagnosis present

## 2020-01-22 DIAGNOSIS — Z8701 Personal history of pneumonia (recurrent): Secondary | ICD-10-CM | POA: Diagnosis not present

## 2020-01-22 DIAGNOSIS — J208 Acute bronchitis due to other specified organisms: Secondary | ICD-10-CM | POA: Diagnosis not present

## 2020-01-22 DIAGNOSIS — J209 Acute bronchitis, unspecified: Secondary | ICD-10-CM | POA: Diagnosis not present

## 2020-01-22 LAB — COMPREHENSIVE METABOLIC PANEL
ALT: 18 U/L (ref 0–44)
AST: 15 U/L (ref 15–41)
Albumin: 3.2 g/dL — ABNORMAL LOW (ref 3.5–5.0)
Alkaline Phosphatase: 44 U/L (ref 38–126)
Anion gap: 11 (ref 5–15)
BUN: 10 mg/dL (ref 8–23)
CO2: 20 mmol/L — ABNORMAL LOW (ref 22–32)
Calcium: 8.6 mg/dL — ABNORMAL LOW (ref 8.9–10.3)
Chloride: 106 mmol/L (ref 98–111)
Creatinine, Ser: 1.26 mg/dL — ABNORMAL HIGH (ref 0.61–1.24)
GFR calc Af Amer: >60 mL/min (ref >60)
GFR calc non Af Amer: 57 mL/min — ABNORMAL LOW (ref >60)
Glucose, Bld: 105 mg/dL — ABNORMAL HIGH (ref 70–99)
Potassium: 3.7 mmol/L (ref 3.5–5.1)
Sodium: 137 mmol/L (ref 135–145)
Total Bilirubin: 0.9 mg/dL (ref 0.3–1.2)
Total Protein: 6 g/dL — ABNORMAL LOW (ref 6.5–8.1)

## 2020-01-22 LAB — EXPECTORATED SPUTUM ASSESSMENT W GRAM STAIN, RFLX TO RESP C

## 2020-01-22 LAB — CBC WITH DIFFERENTIAL/PLATELET
Abs Immature Granulocytes: 0.03 10*3/uL (ref 0.00–0.07)
Basophils Absolute: 0.1 10*3/uL (ref 0.0–0.1)
Basophils Relative: 1 %
Eosinophils Absolute: 0.1 10*3/uL (ref 0.0–0.5)
Eosinophils Relative: 1 %
HCT: 41.3 % (ref 39.0–52.0)
Hemoglobin: 13.2 g/dL (ref 13.0–17.0)
Immature Granulocytes: 0 %
Lymphocytes Relative: 14 %
Lymphs Abs: 1.2 10*3/uL (ref 0.7–4.0)
MCH: 29.3 pg (ref 26.0–34.0)
MCHC: 32 g/dL (ref 30.0–36.0)
MCV: 91.8 fL (ref 80.0–100.0)
Monocytes Absolute: 1 10*3/uL (ref 0.1–1.0)
Monocytes Relative: 11 %
Neutro Abs: 6.4 10*3/uL (ref 1.7–7.7)
Neutrophils Relative %: 73 %
Platelets: 231 10*3/uL (ref 150–400)
RBC: 4.5 MIL/uL (ref 4.22–5.81)
RDW: 13.7 % (ref 11.5–15.5)
WBC: 8.7 10*3/uL (ref 4.0–10.5)
nRBC: 0 % (ref 0.0–0.2)

## 2020-01-22 LAB — TROPONIN I (HIGH SENSITIVITY)
Troponin I (High Sensitivity): 22 ng/L — ABNORMAL HIGH (ref <18)
Troponin I (High Sensitivity): 20 ng/L — ABNORMAL HIGH (ref <18)

## 2020-01-22 LAB — PROCALCITONIN: Procalcitonin: <0.1 ng/mL

## 2020-01-22 LAB — MAGNESIUM: Magnesium: 2.1 mg/dL (ref 1.7–2.4)

## 2020-01-22 LAB — SARS CORONAVIRUS 2 BY RT PCR (HOSPITAL ORDER, PERFORMED IN ~~LOC~~ HOSPITAL LAB): SARS Coronavirus 2: NEGATIVE

## 2020-01-22 LAB — BRAIN NATRIURETIC PEPTIDE: B Natriuretic Peptide: 184.6 pg/mL — ABNORMAL HIGH (ref 0.0–100.0)

## 2020-01-22 LAB — TSH: TSH: 1.171 u[IU]/mL (ref 0.350–4.500)

## 2020-01-22 MED ORDER — IPRATROPIUM BROMIDE 0.06 % NA SOLN
2.0000 | Freq: Three times a day (TID) | NASAL | Status: DC
  Administered 2020-01-22 – 2020-01-27 (×13): 2 via NASAL
  Filled 2020-01-22 (×2): qty 15

## 2020-01-22 MED ORDER — ACETAMINOPHEN 325 MG PO TABS
650.0000 mg | ORAL_TABLET | Freq: Four times a day (QID) | ORAL | Status: DC | PRN
  Administered 2020-01-22 – 2020-01-24 (×2): 650 mg via ORAL
  Filled 2020-01-22 (×2): qty 2

## 2020-01-22 MED ORDER — FAMOTIDINE 20 MG PO TABS
40.0000 mg | ORAL_TABLET | Freq: Every day | ORAL | Status: DC
  Administered 2020-01-22 – 2020-01-23 (×2): 40 mg via ORAL
  Filled 2020-01-22 (×2): qty 2

## 2020-01-22 MED ORDER — HYDROXYZINE HCL 25 MG PO TABS
25.0000 mg | ORAL_TABLET | Freq: Every day | ORAL | Status: DC
  Administered 2020-01-22 – 2020-01-27 (×5): 25 mg via ORAL
  Filled 2020-01-22 (×6): qty 1

## 2020-01-22 MED ORDER — LEVOTHYROXINE SODIUM 100 MCG PO TABS
100.0000 ug | ORAL_TABLET | ORAL | Status: DC
  Administered 2020-01-23 – 2020-01-27 (×5): 100 ug via ORAL
  Filled 2020-01-22 (×5): qty 1

## 2020-01-22 MED ORDER — ACITRETIN 25 MG PO CAPS: 25.0000 mg | ORAL_CAPSULE | Freq: Every day | ORAL | Status: DC

## 2020-01-22 MED ORDER — ACETAMINOPHEN 650 MG RE SUPP: 650.0000 mg | Freq: Four times a day (QID) | RECTAL | Status: DC | PRN

## 2020-01-22 MED ORDER — ENOXAPARIN SODIUM 40 MG/0.4ML ~~LOC~~ SOLN
40.0000 mg | SUBCUTANEOUS | Status: DC
  Administered 2020-01-22: 40 mg via SUBCUTANEOUS
  Filled 2020-01-22: qty 0.4

## 2020-01-22 MED ORDER — METHYLPREDNISOLONE SODIUM SUCC 40 MG IJ SOLR
40.0000 mg | Freq: Two times a day (BID) | INTRAMUSCULAR | Status: DC
  Administered 2020-01-22 – 2020-01-26 (×8): 40 mg via INTRAVENOUS
  Filled 2020-01-22 (×8): qty 1

## 2020-01-22 MED ORDER — DEXTROMETHORPHAN POLISTIREX ER 30 MG/5ML PO SUER
30.0000 mg | Freq: Four times a day (QID) | ORAL | Status: DC | PRN
  Filled 2020-01-22: qty 5

## 2020-01-22 MED ORDER — ASPIRIN 81 MG PO CHEW
81.0000 mg | CHEWABLE_TABLET | Freq: Every day | ORAL | Status: DC
  Administered 2020-01-22 – 2020-01-23 (×2): 81 mg via ORAL
  Filled 2020-01-22 (×2): qty 1

## 2020-01-22 MED ORDER — PANTOPRAZOLE SODIUM 40 MG PO TBEC
40.0000 mg | DELAYED_RELEASE_TABLET | Freq: Every day | ORAL | Status: DC
  Administered 2020-01-22 – 2020-01-27 (×6): 40 mg via ORAL
  Filled 2020-01-22 (×6): qty 1

## 2020-01-22 MED ORDER — LEVALBUTEROL TARTRATE 45 MCG/ACT IN AERO
4.0000 | INHALATION_SPRAY | Freq: Once | RESPIRATORY_TRACT | Status: AC
  Administered 2020-01-22: 4 via RESPIRATORY_TRACT
  Filled 2020-01-22: qty 15

## 2020-01-22 MED ORDER — DILTIAZEM HCL-DEXTROSE 125-5 MG/125ML-% IV SOLN (PREMIX)
5.0000 mg/h | INTRAVENOUS | Status: DC
  Administered 2020-01-22: 5 mg/h via INTRAVENOUS
  Administered 2020-01-23 – 2020-01-24 (×3): 15 mg/h via INTRAVENOUS
  Filled 2020-01-22 (×6): qty 125

## 2020-01-22 MED ORDER — SODIUM CHLORIDE 0.9 % IV BOLUS
500.0000 mL | Freq: Once | INTRAVENOUS | Status: AC
  Administered 2020-01-22: 500 mL via INTRAVENOUS

## 2020-01-22 MED ORDER — DILTIAZEM LOAD VIA INFUSION
20.0000 mg | Freq: Once | INTRAVENOUS | Status: AC
  Administered 2020-01-22: 20 mg via INTRAVENOUS
  Filled 2020-01-22: qty 20

## 2020-01-22 MED ORDER — TRIAMCINOLONE ACETONIDE 0.1 % EX CREA
TOPICAL_CREAM | Freq: Two times a day (BID) | CUTANEOUS | Status: DC
  Filled 2020-01-22 (×4): qty 15

## 2020-01-22 MED ORDER — IPRATROPIUM BROMIDE HFA 17 MCG/ACT IN AERS
2.0000 | INHALATION_SPRAY | Freq: Once | RESPIRATORY_TRACT | Status: AC
  Administered 2020-01-22: 2 via RESPIRATORY_TRACT
  Filled 2020-01-22: qty 12.9

## 2020-01-22 MED ORDER — DEXTROMETHORPHAN HBR 15 MG/5ML PO SYRP: 10.0000 mL | ORAL_SOLUTION | Freq: Four times a day (QID) | ORAL | Status: DC | PRN

## 2020-01-22 MED ORDER — IPRATROPIUM BROMIDE 0.02 % IN SOLN: 0.5000 mg | Freq: Four times a day (QID) | RESPIRATORY_TRACT | Status: DC

## 2020-01-22 MED ORDER — TRIAMCINOLONE ACETONIDE 0.1 % EX OINT: TOPICAL_OINTMENT | Freq: Two times a day (BID) | CUTANEOUS | Status: DC

## 2020-01-22 MED ORDER — IPRATROPIUM BROMIDE 0.02 % IN SOLN
0.5000 mg | Freq: Four times a day (QID) | RESPIRATORY_TRACT | Status: DC
  Administered 2020-01-22 – 2020-01-23 (×4): 0.5 mg via RESPIRATORY_TRACT
  Filled 2020-01-22 (×4): qty 2.5

## 2020-01-22 MED ORDER — BENZONATATE 100 MG PO CAPS
100.0000 mg | ORAL_CAPSULE | Freq: Three times a day (TID) | ORAL | Status: DC | PRN
  Administered 2020-01-22 – 2020-01-24 (×4): 100 mg via ORAL
  Filled 2020-01-22 (×4): qty 1

## 2020-01-22 MED ORDER — IPRATROPIUM-ALBUTEROL 0.5-2.5 (3) MG/3ML IN SOLN: 3.0000 mL | Freq: Four times a day (QID) | RESPIRATORY_TRACT | Status: DC

## 2020-01-22 MED ORDER — MONTELUKAST SODIUM 10 MG PO TABS
5.0000 mg | ORAL_TABLET | Freq: Every day | ORAL | Status: DC
  Administered 2020-01-22 – 2020-01-26 (×5): 5 mg via ORAL
  Filled 2020-01-22: qty 1
  Filled 2020-01-22: qty 0.5
  Filled 2020-01-22 (×4): qty 1

## 2020-01-22 MED ORDER — BETAMETHASONE DIPROPIONATE AUG 0.05 % EX CREA: TOPICAL_CREAM | Freq: Two times a day (BID) | CUTANEOUS | Status: DC

## 2020-01-22 MED ORDER — METHYLPREDNISOLONE ACETATE 40 MG/ML IJ SUSP: 40.0000 mg | Freq: Once | INTRAMUSCULAR | Status: DC

## 2020-01-22 MED ORDER — FLUTICASONE FUROATE-VILANTEROL 100-25 MCG/INH IN AEPB: 1.0000 | INHALATION_SPRAY | Freq: Every day | RESPIRATORY_TRACT | Status: DC

## 2020-01-22 MED ORDER — FOLIC ACID 1 MG PO TABS
1.0000 mg | ORAL_TABLET | Freq: Every day | ORAL | Status: DC
  Administered 2020-01-22 – 2020-01-27 (×6): 1 mg via ORAL
  Filled 2020-01-22 (×6): qty 1

## 2020-01-22 MED ORDER — ARFORMOTEROL TARTRATE 15 MCG/2ML IN NEBU
15.0000 ug | INHALATION_SOLUTION | Freq: Two times a day (BID) | RESPIRATORY_TRACT | Status: DC
  Filled 2020-01-22 (×2): qty 2

## 2020-01-22 NOTE — Assessment & Plan Note (Signed)
Left-sided pneumonia-patient has completed a full course of antibiotics without clinical improvement.  CT chest is still pending.  Patient is immunosuppressed.  Would recommend sputum culture and further evaluation with CT scan for further evaluation. Patient transportation to ER via EMS  Plan  Patient Instructions  Go to ER .

## 2020-01-22 NOTE — Patient Instructions (Signed)
Go to ER

## 2020-01-22 NOTE — Assessment & Plan Note (Signed)
Tachycardia with EKG showing heart rate at 162, nonspecific ST changes.  No EKG for comparison.  Patient does not have active chest pain however concern for at risk will need further evaluation to rule out acute cardiac issues.

## 2020-01-22 NOTE — ED Provider Notes (Signed)
[facility] EMERGENCY DEPARTMENT Provider Note   CSN: [id number] Arrival date & time: 01/22/20  1305     History Chief Complaint  Patient presents with  . Atrial Fibrillation  . Cough    Todd Harrison is a 74 y.o. male.  HPI 74 year old male presents with A. fib.  He was sent from his pulmonology office for this.  He has no previous known diagnosis of atrial fibrillation.  He went to pulmonology clinic today as follow-up for pneumonia, which he states he has not recovered from.  He was diagnosed with pneumonia a couple weeks ago and took antibiotics and steroids.  However he still has had cough with shortness of breath.  He has not noticed any palpitations or dizziness.  There is no chest pain.  He has chronic leg swelling that is unchanged.  He has white sputum which occasionally has some blood if he coughs really hard but otherwise no blood.   Past Medical History:  Diagnosis Date  . Lichen planus   . Methotrexate, long term, current use   . Psoriasis   . Thyroid disease     Patient Active Problem List   Diagnosis Date Noted  . Tachycardia 01/22/2020  . Pneumonia 01/09/2020  . Asthmatic bronchitis 10/20/2019  . Methotrexate, long term, current use   . Cough present for greater than 3 weeks 04/30/2019  . Shortness of breath 04/30/2019  . Complicated acute bronchitis 04/30/2019  . Allergic contact dermatitis 09/09/2018  . Acquired hypothyroidism 06/09/2016  . Rotator cuff tear, right 09/22/2010  . METATARSALGIA 11/24/2008  . FOOT PAIN, LEFT 11/24/2008  . TALIPES CAVUS 11/24/2008    Past Surgical History:  Procedure Laterality Date  . CATARACT EXTRACTION Left    2019       Family History  Problem Relation Age of Onset  . Diabetes Mother   . Stroke Mother     Social History   Tobacco Use  . Smoking status: Former Smoker    Types: Cigarettes    Quit date: 1982    Years since quitting: 39.7  . Smokeless tobacco: Never Used  Vaping  Use  . Vaping Use: Never assessed  Substance Use Topics  . Alcohol use: Yes    Alcohol/week: 1.0 standard drink    Types: 1 Glasses of wine per week  . Drug use: No    Home Medications Prior to Admission medications   Medication Sig Start Date End Date Taking? Authorizing Provider  acitretin (SORIATANE) 25 MG capsule Take 25 mg by mouth daily. 02/18/19   [provider]  ADVAIR DISKUS 100-50 MCG/DOSE AEPB INHALE 1 PUFF INTO THE LUNGS IN THE MORNING AND AT BEDTIME. 01/04/20   Maximiano Coss, NP  albuterol (PROVENTIL) (2.5 MG/3ML) 0.083% nebulizer solution Take 3 mLs (2.5 mg total) by nebulization every 6 (six) hours as needed for wheezing or shortness of breath. 01/08/20   Parrett, Fonnie Mu, NP  albuterol (VENTOLIN HFA) 108 (90 Base) MCG/ACT inhaler Inhale 2 puffs into the lungs every 6 (six) hours as needed for wheezing or shortness of breath. 10/17/19   Posey Boyer, MD  augmented betamethasone dipropionate (DIPROLENE-AF) 0.05 % cream  10/01/19   [provider]  benzonatate (TESSALON) 100 MG capsule Take 1 capsule (100 mg total) by mouth 3 (three) times daily as needed for cough. 12/31/19   Wendie Agreste, MD  budesonide-formoterol [facility]) 160-4.5 MCG/ACT inhaler Inhale 2 puffs into the lungs 2 (two) times daily. 01/08/20   Parrett,  Tammy S, NP  dextromethorphan 15 MG/5ML syrup Take 10 mLs (30 mg total) by mouth 4 (four) times daily as needed for cough. 03/31/19   Maximiano Coss, NP  famotidine (PEPCID) 40 MG tablet Take by mouth. 12/02/19 03/01/20  [provider]  folic acid (FOLVITE) 1 MG tablet Take 1 mg by mouth daily. 07/08/18   [provider]  hydrOXYzine (ATARAX/VISTARIL) 25 MG tablet Take 25 mg by mouth daily. 10/01/19   [provider]  ipratropium (ATROVENT) 0.06 % nasal spray Place 2 sprays into both nostrils 3 (three) times daily. 12/02/19   [provider]  ipratropium (ATROVENT) 0.06 % nasal spray Place into the nose.  12/02/19 01/01/20  [provider]  levofloxacin (LEVAQUIN) 750 MG tablet Take 1 tablet (750 mg total) by mouth daily. 01/01/20   Faustino Congress, NP  levothyroxine (SYNTHROID, LEVOTHROID) 100 MCG tablet Take 100 mcg by mouth daily. 05/06/11   [provider]  methotrexate (RHEUMATREX) 2.5 MG tablet  08/30/18   [provider]  montelukast (SINGULAIR) 10 MG tablet TAKE 1 TABLET BY MOUTH EVERYDAY AT BEDTIME 01/07/20   Maximiano Coss, NP  mupirocin ointment Drue Stager) 2 %  04/28/19   [provider]  mycophenolate (CELLCEPT) 500 MG tablet Take by mouth 2 (two) times daily.    [provider]  omeprazole (PRILOSEC) 40 MG capsule Take 1 capsule (40 mg total) by mouth daily. 11/21/19   Posey Boyer, MD  triamcinolone cream (KENALOG) 0.1 %  08/29/18   [provider]  triamcinolone ointment (KENALOG) 0.1 % APPLY TO AFFECTED AREA TWICE A DAY 08/12/19   [provider]    Allergies    Gold and Mixed grasses  Review of Systems   Review of Systems  Respiratory: Positive for cough and shortness of breath.   Cardiovascular: Positive for leg swelling. Negative for chest pain and palpitations.  All other systems reviewed and are negative.   Physical Exam Updated Vital Signs BP 121/83 (BP Location: Right Arm)   Pulse (!) 136   Temp 99.1 F (37.3 C) (Oral)   Resp (!) 29   SpO2 96%   Physical Exam Vitals and nursing note reviewed.  Constitutional:      General: He is not in acute distress.    Appearance: He is well-developed. He is obese. He is not ill-appearing or diaphoretic.  HENT:     Head: Normocephalic and atraumatic.     Right Ear: External ear normal.     Left Ear: External ear normal.     Nose: Nose normal.  Eyes:     General:        Right eye: No discharge.        Left eye: No discharge.  Cardiovascular:     Rate and Rhythm: Tachycardia present. Rhythm irregular.     Heart sounds: Normal heart sounds.  Pulmonary:      Effort: Pulmonary effort is normal.     Breath sounds: Wheezing (diffuse, expiratory) present.  Abdominal:     Palpations: Abdomen is soft.     Tenderness: There is no abdominal tenderness.  Musculoskeletal:     Cervical back: Neck supple.     Right lower leg: Edema present.     Left lower leg: Edema present.     Comments: Mild pitting edema to BLE  Skin:    General: Skin is warm and dry.  Neurological:     Mental Status: He is alert.  Psychiatric:  Mood and Affect: Mood is not anxious.     ED Results / Procedures / Treatments   Labs (all labs ordered are listed, but only abnormal results are displayed) Labs Reviewed  COMPREHENSIVE METABOLIC PANEL - Abnormal; Notable for the following components:      Result Value   CO2 20 (*)    Glucose, Bld 105 (*)    Creatinine, Ser 1.26 (*)    Calcium 8.6 (*)    Total Protein 6.0 (*)    Albumin 3.2 (*)    GFR calc non Af Amer 57 (*)    All other components within normal limits  BRAIN NATRIURETIC PEPTIDE - Abnormal; Notable for the following components:   B Natriuretic Peptide 184.6 (*)    All other components within normal limits  TROPONIN I (HIGH SENSITIVITY) - Abnormal; Notable for the following components:   Troponin I (High Sensitivity) 22 (*)    All other components within normal limits  SARS CORONAVIRUS 2 BY RT PCR (HOSPITAL ORDER, [facility])  CULTURE, BLOOD (ROUTINE X 2)  CULTURE, BLOOD (ROUTINE X 2)  EXPECTORATED SPUTUM ASSESSMENT W REFEX TO RESP CULTURE  CBC WITH DIFFERENTIAL/PLATELET  MAGNESIUM  HIV ANTIBODY (ROUTINE TESTING W REFLEX)  QUANTIFERON-TB GOLD PLUS  PROCALCITONIN  TROPONIN I (HIGH SENSITIVITY)    EKG EKG Interpretation  Date/Time:  Thursday January 22 2020 13:59:22 EDT Ventricular Rate:  141 PR Interval:    QRS Duration: 98 QT Interval:  327 QTC Calculation: 501 R Axis:   76 Text Interpretation: Atrial fibrillation with RVR Prolonged QT interval Confirmed  by Sherwood Gambler [phone]) on 01/22/2020 2:16:14 PM   Radiology DG Chest Portable 1 View  Result Date: 01/22/2020 CLINICAL DATA:  Cough for weeks EXAM: PORTABLE CHEST 1 VIEW COMPARISON:  01/01/2020 FINDINGS: Normal heart size, mediastinal contours, and pulmonary vascularity. Lungs clear. Resolution of LEFT mid lung infiltrates since previous exam. No pleural effusion or pneumothorax. Osseous structures unremarkable. IMPRESSION: Resolution of previously identified pneumonia in LEFT mid lung. No new abnormalities. Electronically Signed   By: Lavonia Dana M.D.   On: 01/22/2020 13:50    Procedures .Critical Care Performed by: Sherwood Gambler, MD Authorized by: Sherwood Gambler, MD   Critical care provider statement:    Critical care time (minutes):  35   Critical care time was exclusive of:  Separately billable procedures and treating other patients   Critical care was necessary to treat or prevent imminent or life-threatening deterioration of the following conditions:  Cardiac failure and circulatory failure   Critical care was time spent personally by me on the following activities:  Discussions with consultants, evaluation of patient's response to treatment, examination of patient, ordering and performing treatments and interventions, ordering and review of laboratory studies, ordering and review of radiographic studies, pulse oximetry, re-evaluation of patient's condition, obtaining history from patient or surrogate and review of old charts   (including critical care time)  Medications Ordered in ED Medications  diltiazem (CARDIZEM) 1 mg/mL load via infusion 20 mg (has no administration in time range)    And  diltiazem (CARDIZEM) 125 mg in dextrose 5% 125 mL (1 mg/mL) infusion (has no administration in time range)  levalbuterol (XOPENEX HFA) inhaler 4 puff (has no administration in time range)  ipratropium (ATROVENT HFA) inhaler 2 puff (has no administration in time range)    ED Course   I have reviewed the triage vital signs and the nursing notes.  Pertinent labs & imaging results that were available  during my care of the patient were reviewed by me and considered in my medical decision making (see chart for details).    MDM Rules/Calculators/A&P                          Patient sent from pulmonology clinic with A. fib with RVR.  He also has persistent cough and shortness of breath with diffuse wheezing.  Somewhat better with Xopenex and Atrovent and was given steroids.  For his A. fib he was given diltiazem and put on a drip.  While this is a new diagnosis, unclear exactly when it started as he does not seem to be symptomatic from an A. fib perspective.  Thus not in ED cardioversion candidate.  Will get CT scan of the chest as that is what pulmonology was planning to do for his continued shortness of breath and cough.  He will need admission. Final Clinical Impression(s) / ED Diagnoses Final diagnoses:  Atrial fibrillation with RVR [facility])    Rx / DC Orders ED Discharge Orders    None       Sherwood Gambler, MD 01/22/20 [phone]

## 2020-01-22 NOTE — ED Notes (Signed)
Medication acitertin (soriatane) not available at our pharmacy. Per pharmacy [facility] pt would need to bring medication from home. Pt informed

## 2020-01-22 NOTE — Progress Notes (Addendum)
@Patient  ID: Todd Harrison, male    DOB: [birth year], 74 y.o.   MRN: [id number]  Chief Complaint  Patient presents with  . Follow-up    Referring provider: Maximiano Coss, NP  HPI: 74 year old male former smoker seen for pulmonary consult April 15, 2019 for cough felt to be consistent with mild persistent asthma and upper airway cough possibly secondary to chronic rhinitis and GERD Medical history significant for lichen planus currently on CellCept. Previously on methotrexate. Patient is a jazz musician   TEST/EVENTS :  June 12, 2019 that showed normal lung function with FEV1 at 87%, ratio 76, FVC 84%. No significant bronchodilator response. DLCO was 87%.  Chest x-ray done October 17, 2019 showed no acute process.  CT sinus December 06, 2019 paranasal sinuses well aerated.  No evidence of sinusitis.  Nasal septal deviation leftward  Sputum culture October 24, 2019 growth of normal oral flora.  01/22/2020 Follow up : PNA and AB  Patient returns for a 2-week follow-up.  Patient has been having a slow to resolve asthmatic bronchitic exacerbation over the last 3 weeks.  Was seen in the emergency room on January 01, 2020 for ongoing cough is been going on for over 9 months.  Chest x-ray showed new reticular nodular opacities in the left mid lower lobe suspicious for superimposed infection.  COVID-19 testing was negative.  Patient was started on Levaquin 750 mg for 7 days and a prednisone taper.  On return visit 2 weeks ago to the office patient only had minimal improvement in his symptoms.  He was changed over from Advair to Symbicort.  Given a prednisone taper.  And set up for a CT chest.  This is currently pending Patient returns today for not feeling much better.  States he only has slight improvement in his breathing.  Continues to have a very congested cough with thick mucus.  Intermittent wheezing and increased shortness of breath.  On arrival to the office today patient has increased  work of breathing with tachycardia.  EKG was done showing tachycardia with heart rate at 162.  Patient denies any chest pain diaphoresis or radiating pains.  No calf pain.  Patient does have lower extremity swelling.   Allergies  Allergen Reactions  . Gold Itching  . Mixed Grasses     Immunization History  Administered Date(s) Administered  . Fluad Quad(high Dose 65+) 03/04/2019  . Influenza-Unspecified 03/08/2016  . PFIZER SARS-COV-2 Vaccination 06/13/2019, 07/04/2019    Past Medical History:  Diagnosis Date  . Lichen planus   . Methotrexate, long term, current use   . Psoriasis   . Thyroid disease     Tobacco History: Social History   Tobacco Use  Smoking Status Former Smoker  . Types: Cigarettes  . Quit date: 52  . Years since quitting: 39.7  Smokeless Tobacco Never Used   Counseling given: Not Answered   Outpatient Medications Prior to Visit  Medication Sig Dispense Refill  . acitretin (SORIATANE) 25 MG capsule Take 25 mg by mouth daily.    Marland Kitchen ADVAIR DISKUS 100-50 MCG/DOSE AEPB INHALE 1 PUFF INTO THE LUNGS IN THE MORNING AND AT BEDTIME. 60 each 2  . albuterol (PROVENTIL) (2.5 MG/3ML) 0.083% nebulizer solution Take 3 mLs (2.5 mg total) by nebulization every 6 (six) hours as needed for wheezing or shortness of breath. 360 mL 5  . albuterol (VENTOLIN HFA) 108 (90 Base) MCG/ACT inhaler Inhale 2 puffs into the lungs every 6 (six) hours as needed for wheezing or  shortness of breath. 18 g 3  . augmented betamethasone dipropionate (DIPROLENE-AF) 0.05 % cream     . benzonatate (TESSALON) 100 MG capsule Take 1 capsule (100 mg total) by mouth 3 (three) times daily as needed for cough. 30 capsule 0  . budesonide-formoterol (SYMBICORT) 160-4.5 MCG/ACT inhaler Inhale 2 puffs into the lungs 2 (two) times daily. 1 each 12  . dextromethorphan 15 MG/5ML syrup Take 10 mLs (30 mg total) by mouth 4 (four) times daily as needed for cough. 120 mL 0  . famotidine (PEPCID) 40 MG tablet  Take by mouth.    . folic acid (FOLVITE) 1 MG tablet Take 1 mg by mouth daily.    . hydrOXYzine (ATARAX/VISTARIL) 25 MG tablet Take 25 mg by mouth daily.    Marland Kitchen ipratropium (ATROVENT) 0.06 % nasal spray Place 2 sprays into both nostrils 3 (three) times daily.    Marland Kitchen levofloxacin (LEVAQUIN) 750 MG tablet Take 1 tablet (750 mg total) by mouth daily. 7 tablet 0  . levothyroxine (SYNTHROID, LEVOTHROID) 100 MCG tablet Take 100 mcg by mouth daily.    . methotrexate (RHEUMATREX) 2.5 MG tablet     . montelukast (SINGULAIR) 10 MG tablet TAKE 1 TABLET BY MOUTH EVERYDAY AT BEDTIME 90 tablet 1  . mupirocin ointment (BACTROBAN) 2 %     . mycophenolate (CELLCEPT) 500 MG tablet Take by mouth 2 (two) times daily.    Marland Kitchen omeprazole (PRILOSEC) 40 MG capsule Take 1 capsule (40 mg total) by mouth daily. 30 capsule 3  . triamcinolone cream (KENALOG) 0.1 %     . triamcinolone ointment (KENALOG) 0.1 % APPLY TO AFFECTED AREA TWICE A DAY    . amoxicillin-clavulanate (AUGMENTIN) 875-125 MG tablet Take 1 tablet by mouth 2 (two) times daily. 20 tablet 0  . predniSONE (DELTASONE) 10 MG tablet 4 tabs for 2 days, then 3 tabs for 2 days, 2 tabs for 2 days, then 1 tab for 2 days, then stop 20 tablet 0  . ipratropium (ATROVENT) 0.06 % nasal spray Place into the nose.     Facility-Administered Medications Prior to Visit  Medication Dose Route Frequency Provider Last Rate Last Admin  . methylPREDNISolone acetate (DEPO-MEDROL) injection 40 mg  40 mg Intra-articular Once Claris Gower, MD         Review of Systems:   Constitutional:   No  weight loss, night sweats,  Fevers, chills, + fatigue, or  lassitude.  HEENT:   No headaches,  Difficulty swallowing,  Tooth/dental problems, or  Sore throat,                No sneezing, itching, ear ache, + nasal congestion, post nasal drip,   CV:  No chest pain,  Orthopnea, PND,++ swelling in lower extremities, anasarca, dizziness, palpitations, syncope.   GI  No heartburn,  indigestion, abdominal pain, nausea, vomiting, diarrhea, change in bowel habits, loss of appetite, bloody stools.   Resp:   No chest wall deformity  Skin: no rash or lesions.  GU: no dysuria, change in color of urine, no urgency or frequency.  No flank pain, no hematuria   MS:  No joint pain or swelling.  No decreased range of motion.  No back pain.    Physical Exam  BP 118/64 (BP Location: Left Arm, Cuff Size: Normal)   Pulse (!) 139   Temp 98.1 F (36.7 C) (Temporal)   Ht 6' (1.829 m)   Wt 299 lb 9.6 oz (135.9 kg)   SpO2 92%  Comment: RA  BMI 40.63 kg/m   GEN: A/Ox3; pleasant , NAD, BMI 40   HEENT:  [place]/AT,   NOSE-clear, THROAT-clear, no lesions, no postnasal drip or exudate noted.   NECK:  Supple w/ fair ROM; no JVD; normal carotid impulses w/o bruits; no thyromegaly or nodules palpated; no lymphadenopathy.    RESP scattered rhonchi with expiratory wheezing.  no accessory muscle use, no dullness to percussion  CARD:  RRR, no m/r/g, 1-2 +  peripheral edema, pulses intact, no cyanosis or clubbing.  GI:   Soft & nt; nml bowel sounds; no organomegaly or masses detected.   Musco: Warm bil, no deformities or joint swelling noted.   Neuro: alert, no focal deficits noted.    Skin: Warm, no lesions or rashes    Lab Results:  CBC    Component Value Date/Time   WBC 7.4 01/01/2020 1359   WBC 7.2 08/10/2016 1123   RBC 4.83 01/01/2020 1359   RBC 4.75 08/10/2016 1123   HGB 13.8 01/01/2020 1359   HCT 42.7 01/01/2020 1359   PLT 275 01/01/2020 1359   MCV 88 01/01/2020 1359   MCH 28.6 01/01/2020 1359   MCH 31.2 08/10/2016 1123   MCHC 32.3 01/01/2020 1359   MCHC 35.0 08/10/2016 1123   RDW 12.8 01/01/2020 1359   LYMPHSABS 1.4 01/01/2020 1359   EOSABS 0.1 01/01/2020 1359   BASOSABS 0.1 01/01/2020 1359    BMET    Component Value Date/Time   NA 136 01/01/2020 1359   K 3.8 01/01/2020 1359   CL 102 01/01/2020 1359   CO2 20 01/01/2020 1359   GLUCOSE 112 (H) 01/01/2020  1359   BUN 9 01/01/2020 1359   CREATININE 1.11 01/01/2020 1359   CALCIUM 8.8 01/01/2020 1359   GFRNONAA 66 01/01/2020 1359   GFRAA 77 01/01/2020 1359    BNP No results found for: BNP  ProBNP No results found for: PROBNP  Imaging: DG Chest 2 View  Result Date: 01/01/2020 CLINICAL DATA:  Cough EXAM: CHEST - 2 VIEW COMPARISON:  October 17, 2019 FINDINGS: The cardiomediastinal silhouette is unchanged in contour.Tortuous thoracic aorta. No pleural effusion. No pneumothorax. Coarse reticular opacities bilaterally. Are more focal reticulonodular opacities within the LEFT mid lower lobe, new since prior. Visualized abdomen is unremarkable. Multilevel degenerative changes of the thoracic spine. Unchanged wedging of a vertebral body of the inferior thoracic spine. IMPRESSION: 1. New reticulonodular opacities within the LEFT mid lower lobe, suspicious for infection. Recommend follow-up radiograph in 4-6 weeks to assess for resolution. 2. Diffuse interstitial prominence may reflect underlying pulmonary fibrosis or emphysema. Electronically Signed   By: Valentino Saxon MD   On: 01/01/2020 13:59      PFT Results Latest Ref Rng & Units 06/12/2019  FVC-Pre L 4.00  FVC-Predicted Pre % 84  FVC-Post L 4.07  FVC-Predicted Post % 86  Pre FEV1/FVC % % 76  Post FEV1/FCV % % 73  FEV1-Pre L 3.04  FEV1-Predicted Pre % 87  FEV1-Post L 2.98  DLCO uncorrected ml/min/mmHg 24.05  DLCO UNC% % 87  DLVA Predicted % 94  TLC L 6.46  TLC % Predicted % 86  RV % Predicted % 87    No results found for: NITRICOXIDE      Assessment & Plan:   Asthmatic bronchitis Slow to resolve asthmatic bronchitic exacerbation-with superimposed pneumonia.  Concern for possible volume overload as patient has extensive lower extremity swelling.  Now with tachycardia may be in diastolic dysfunction. Patient has increased work of breathing  will need higher level evaluation and care.  EMS was contacted for transportation to  emergency room. EMS on arrival with patient stable O2 saturations greater than 90% on 2 L of oxygen.    Pneumonia Left-sided pneumonia-patient has completed a full course of antibiotics without clinical improvement.  CT chest is still pending.  Patient is immunosuppressed.  Would recommend sputum culture and further evaluation with CT scan for further evaluation. Patient transportation to ER via EMS  Plan  Patient Instructions  Go to ER .      Tachycardia Tachycardia with EKG showing heart rate at 162, nonspecific ST changes.  No EKG for comparison.  Patient does not have active chest pain however concern for at risk will need further evaluation to rule out acute cardiac issues.     Rexene Edison, NP 01/22/2020

## 2020-01-22 NOTE — Consult Note (Signed)
NAME:  Todd Harrison, MRN:  [id number], DOB:  22-Oct-1945, LOS: 0 ADMISSION DATE:  01/22/2020, CONSULTATION DATE:  01/22/2020 REFERRING MD:  Dr. Roosevelt Locks, CHIEF COMPLAINT:  Wheezing  Brief History   54 yoM previously treated for left sided pneumonia with slow to resolve asthmatic bronchitis found to be in Afib with RVR in followup office apt.  Sent to ER.  [facility] to admit, PCCM asked to see for recurrent wheezing and possibly recurrent pneumonia.  History of present illness   74 year old male with history of former smoker, mild persistent asthma, lichen planus on cellcept, GERD and chronic rhinitis sent from our pulmonary office today to [facility] ER for new onset Afib with RVR.  Patient is followed in our pulmonary office since 04/2019 for his mild persistent asthma and upper airway cough thought secondary to his chronic rhinitis and GERD. His workup thus far with normal lung function on PFTs 06/2019 (FEV1 at 87%, ratio 76, FVC 84%, no significant bronchodilator response, DLCO was 87%).  Patient is followed by [facility] dermatology for his lichens planus on his feet.  Previously he was on methotrexate which did not help and was switched to Cellcept in 05/2019.  Reports he still has needed intermittent steroid tapers for flare ups in which he responds well to. Since his lichen planus on his feet, his mobility has been affected which has caused him some intermittent shortness of breath.  Also reports episodes of hoarseness, worse of late, and sinus infections.  Denies dysphagia but states he has so much mucous he gets choked.  He has been unable to sleep more than 3 hours at night secondary to coughing and mucous and sleeps upright.  He was seen previously by ENT with CT of his sinus and scoped in which his larynx had some swelling per patient.  He was placed on a PPI since.  Denies any fever, chills, sick exposures, palpitations, chest pain  He was diagnosed 8/6 with a left lower lobe pneumonia and treated with  levaquin for 7 days and steroid taper.  He returned 2 weeks later with minimal improvement and treated with additional steroid taper and advair changed to symbicort.  Followed back up in our office today again with minimal improvements, with ongoing complaints of congested cough with thick white mucus, intermittent wheezing, and increased shortness of breath.  Also has intermittent leg swelling, usually worse on the right in which he attributes to the lichen planus flare ups as well.   EKG performed in office showed tachycardia in the 160's.  Saturations on room air was 92%, he was afebrile, and noted to have extensive lower extremity swelling.  He was transported to [facility] ER.  In the ER, noted to be in Afib with RVR, normotensive, afebrile, and sats > 93 on room air.  Labs noted for WBC 8.7, normal differential, CO2 20, BNP 184, trop hs 22, COVID neg.  CXR showing resolution of previous left infiltrate.  CT chest pending.  PCCM consulted for further pulmonary recommendations.    Past Medical History  Former smoker Mild persistent asthma Lichen planus on cellcept, previously on methotrexate  psoriasis  GERD, chronic rhinitis  Significant Hospital Events   8/26 treated for left mid lower lobe pneumonia with Levaquin x 7 days and prednisone taper 9/2 additional steroid taper 9/16 admitted [facility]   Consults:  PCCM SLP  Procedures:   Significant Diagnostic Tests:  9/16 CT chest  >>Patchy airspace opacity with tree-in-bud opacities at the posterior left lung  base which could be due to resolving infectious etiology from August 2021 or inflammatory process.  TTE >>  Micro Data:  9/16 SARS2 >> neg 9/16 sputum cx >> 9/16 BCx2 >>  9/16 RVP >>  Antimicrobials:    Interim history/subjective:   Objective   Blood pressure 106/73, pulse 84, temperature 99.1 F (37.3 C), temperature source Oral, resp. rate 18, height 6' (1.829 m), weight 135.6 kg, SpO2 97 %.        Intake/Output Summary (Last  24 hours) at 01/22/2020 1644 Last data filed at 01/22/2020 1524 Gross per 24 hour  Intake 24.98 ml  Output --  Net 24.98 ml   Filed Weights   01/22/20 1418  Weight: 135.6 kg   Examination: General: no acute distress, reclining comfortably in bed HEENT: NCAT, EOMI, PERRLA, MMMP, does have small atrophic plaque on R lateral aspect of tongue Lungs: diffuse rhonchi and wheeze Cardiovascular: RRR, no m/g/r Abdomen: soft, NT,ND, BS+ Extremities: + 2-3+ edema in ble Skin: no fingernails, no toenails and chronic rash on feet warm and dry Neuro: moves all 4 extremities  Resolved Hospital Problem list     Assessment & Plan:  New onset afib:  -diltiazem gtt per primary -tele -consider full dose a/c should this not resolve -agree with echo -pulmonary edema noted and recommend diuresis as renal function allows.  LE edema:  -suspect some level of heart failure  -agree with echo.   H/o Cap in immunocompromised pt:  -on immunosuppression with cellcept as outpt -imaging appears to be improving however  see aspergillus ab sent as well as quantiferon by primary -obtain sputum if able.  -agree with speech examination to r/o aspiration -bronchodilators  -no elevated wbc (was elevated 10/2019) -afebrile, will send pct -suspect this is not infectious in etiology -however, if after heart rate control, diuresis and eval for below he still has continued sob and sputum production then possible bronchoscopy   Lichen planus:  -on chronic cellcept for this, mtx was stopped -on frequent steroids -? If esophageal lichen planus, had recent ent eval with noted laryngeal edema and started on ppi but this has not helped hoarseness.  -this could be contributing to sob -may need gi eval to look at esophagus once hr resolved  CKD2:  -stable indices upon review  Best practice:  Diet: per primary Pain/Anxiety/Delirium protocol (if indicated): not indicated VAP protocol (if indicated): not  indicated DVT prophylaxis: lovenox.. consider full dose with new afib if does not resolve GI prophylaxis: per primary Glucose control: per primary Mobility: per primary Code Status: FULL Family Communication: updated pt Disposition: progressive. We will follow along  Labs   CBC: Recent Labs  Lab 01/22/20 1330  WBC 8.7  NEUTROABS 6.4  HGB 13.2  HCT 41.3  MCV 91.8  PLT 245    Basic Metabolic Panel: Recent Labs  Lab 01/22/20 1330  NA 137  K 3.7  CL 106  CO2 20*  GLUCOSE 105*  BUN 10  CREATININE 1.26*  CALCIUM 8.6*  MG 2.1   GFR: Estimated Creatinine Clearance: 76.7 mL/min (A) (by C-G formula based on SCr of 1.26 mg/dL (H)). Recent Labs  Lab 01/22/20 1330  WBC 8.7    Liver Function Tests: Recent Labs  Lab 01/22/20 1330  AST 15  ALT 18  ALKPHOS 44  BILITOT 0.9  PROT 6.0*  ALBUMIN 3.2*   No results for input(s): LIPASE, AMYLASE in the last 168 hours. No results for input(s): AMMONIA in the last 168 hours.  ABG No results found for: PHART, PCO2ART, PO2ART, HCO3, TCO2, ACIDBASEDEF, O2SAT   Coagulation Profile: No results for input(s): INR, PROTIME in the last 168 hours.  Cardiac Enzymes: No results for input(s): CKTOTAL, CKMB, CKMBINDEX, TROPONINI in the last 168 hours.  HbA1C: No results found for: HGBA1C  CBG: No results for input(s): GLUCAP in the last 168 hours.  Review of Systems:   Cough, white sputum production, sinus congestion, sob, hoarse voice, LE edema, neg for fever, cp  Past Medical History  He,  has a past medical history of Lichen planus, Methotrexate, long term, current use, Psoriasis, and Thyroid disease.   Surgical History    Past Surgical History:  Procedure Laterality Date  . CATARACT EXTRACTION Left    2019     Social History   reports that he quit smoking about 39 years ago. His smoking use included cigarettes. He has never used smokeless tobacco. He reports current alcohol use of about 1.0 standard drink of  alcohol per week. He reports that he does not use drugs.   Family History   His family history includes Diabetes in his mother; Stroke in his mother.   Allergies Allergies  Allergen Reactions  . Gold Itching  . Mixed Grasses      Home Medications  Prior to Admission medications   Medication Sig Start Date End Date Taking? Authorizing Provider  acitretin (SORIATANE) 25 MG capsule Take 25 mg by mouth daily. 02/18/19   [provider]  ADVAIR DISKUS 100-50 MCG/DOSE AEPB INHALE 1 PUFF INTO THE LUNGS IN THE MORNING AND AT BEDTIME. 01/04/20   Maximiano Coss, NP  albuterol (PROVENTIL) (2.5 MG/3ML) 0.083% nebulizer solution Take 3 mLs (2.5 mg total) by nebulization every 6 (six) hours as needed for wheezing or shortness of breath. 01/08/20   Parrett, Fonnie Mu, NP  albuterol (VENTOLIN HFA) 108 (90 Base) MCG/ACT inhaler Inhale 2 puffs into the lungs every 6 (six) hours as needed for wheezing or shortness of breath. 10/17/19   Posey Boyer, MD  augmented betamethasone dipropionate (DIPROLENE-AF) 0.05 % cream  10/01/19   [provider]  benzonatate (TESSALON) 100 MG capsule Take 1 capsule (100 mg total) by mouth 3 (three) times daily as needed for cough. 12/31/19   Wendie Agreste, MD  budesonide-formoterol [facility]) 160-4.5 MCG/ACT inhaler Inhale 2 puffs into the lungs 2 (two) times daily. 01/08/20   Parrett, Fonnie Mu, NP  dextromethorphan 15 MG/5ML syrup Take 10 mLs (30 mg total) by mouth 4 (four) times daily as needed for cough. 03/31/19   Maximiano Coss, NP  famotidine (PEPCID) 40 MG tablet Take by mouth. 12/02/19 03/01/20  [provider]  folic acid (FOLVITE) 1 MG tablet Take 1 mg by mouth daily. 07/08/18   [provider]  hydrOXYzine (ATARAX/VISTARIL) 25 MG tablet Take 25 mg by mouth daily. 10/01/19   [provider]  ipratropium (ATROVENT) 0.06 % nasal spray Place 2 sprays into both nostrils 3 (three) times daily. 12/02/19   [provider]   ipratropium (ATROVENT) 0.06 % nasal spray Place into the nose. 12/02/19 01/01/20  [provider]  levofloxacin (LEVAQUIN) 750 MG tablet Take 1 tablet (750 mg total) by mouth daily. 01/01/20   Faustino Congress, NP  levothyroxine (SYNTHROID, LEVOTHROID) 100 MCG tablet Take 100 mcg by mouth daily. 05/06/11   [provider]  methotrexate (RHEUMATREX) 2.5 MG tablet  08/30/18   [provider]  montelukast (SINGULAIR) 10 MG tablet TAKE 1 TABLET BY MOUTH EVERYDAY  AT BEDTIME 01/07/20   Maximiano Coss, NP  mupirocin ointment Drue Stager) 2 %  04/28/19   [provider]  mycophenolate (CELLCEPT) 500 MG tablet Take by mouth 2 (two) times daily.    [provider]  omeprazole (PRILOSEC) 40 MG capsule Take 1 capsule (40 mg total) by mouth daily. 11/21/19   Posey Boyer, MD  triamcinolone cream (KENALOG) 0.1 %  08/29/18   [provider]  triamcinolone ointment (KENALOG) 0.1 % APPLY TO AFFECTED AREA TWICE A DAY 08/12/19   [provider]     Critical care time: The patient is critically ill with multiple organ systems failure and requires high complexity decision making for assessment and support, frequent evaluation and titration of therapies, application of advanced monitoring technologies and extensive interpretation of multiple databases.  Critical care time 35 mins. This represents my time independent of the NP's/PA's/med students/residents time taking care of the pt. This is excluding procedures.     [facility] 01/22/2020, 4:54 PM

## 2020-01-22 NOTE — ED Notes (Signed)
Unable to draw quantiferon TB lab test at this time. Lab rep unavailable. Lab able to be drawn in the am

## 2020-01-22 NOTE — ED Notes (Signed)
Pt ambulating Spo2 94.

## 2020-01-22 NOTE — ED Notes (Signed)
Pharmacy called requesting verification of medicines. Per pharmacist medicines to be verified and pharmacy tech to reconcile medicines for administration

## 2020-01-22 NOTE — ED Triage Notes (Signed)
Pt arrives EMS from Drs. Office . Pt was at follow up appointment d/t pneumonia dx. Pt continues to have non-productive cough and not feel good. While at appt EKG was taken and it was noted pt was in AFIB RVR

## 2020-01-22 NOTE — ED Notes (Signed)
Pharmacy has been contacted several times and meds have not been verfiied.

## 2020-01-22 NOTE — ED Notes (Signed)
RN spoke to patient placement in regards to TB testing. Per note pt testing for ongoing asthma. No orders placed for airborne/contact. Pt currently under droplet precaution.

## 2020-01-22 NOTE — Assessment & Plan Note (Signed)
Slow to resolve asthmatic bronchitic exacerbation-with superimposed pneumonia.  Concern for possible volume overload as patient has extensive lower extremity swelling.  Now with tachycardia may be in diastolic dysfunction. Patient has increased work of breathing will need higher level evaluation and care.  EMS was contacted for transportation to emergency room. EMS on arrival with patient stable O2 saturations greater than 90% on 2 L of oxygen.

## 2020-01-22 NOTE — H&P (Signed)
History and Physical    Todd Harrison ZOX:[id number] DOB: 1946-02-20 DOA: 01/22/2020  PCP: Maximiano Coss, NP (Confirm with patient/family/NH records and if not entered, this has to be entered at [facility] point of entry) Patient coming from: Home  I have personally briefly reviewed patient's old medical records in [place]  Chief Complaint: Cough and wheezing  HPI: Todd Harrison is a 74 y.o. male with medical history significant of lichen planus, psoriasis, hypothyroidism, presented with persistent cough and wheezing.  Symptoms started about 5 weeks ago, onset was gradual, went to see pulmonology (patient has been following with pulmonology since October 2020 to rule out sleep apnea) about 3 weeks ago, was diagnosed with left-sided pneumonia and acute asthma exacerbation, for which patient was treated 7 days with Levaquin and Medrol pack.  Patient however reported symptoms improvement only modest, he reported more wheezing and cough at night, and occasional whitish sputum production compared to greenish sputum before antibiotic treatment.  Denies any fever chills.  All his recent breathing symptoms accompanied with postnasal drip which he reported used to be administered times but this time happened much bloodier.  He went to see ENT and underwent a angle scope, and the ENT reported that patient had "swelling larynx".  Which ENT or attributed to his GERD problems.  Patient wife also reported patient had a 20 pound weight loss in about 1 to 2 months, patient occasionally has " food stuck in the middle" problem but denied any choking or coughing after eat or drink. HaIs been taking PPI and antihistamine long-term.  Denies any chest pain no abdominal pain no diarrhea.  Today he went to pulmonology for follow-up of pneumonia and persistent cough with wheezing was found heart rate in the 130s and sent to the ER.  He denied any feeling of palpitations no chest pain no significant shortness of  breath.  Patient has been following specialist for his lichen planus/psoriasis on his feet.  He used to be on methotrexate which was stopped recently given lack of response, and he was started on mycophenolate and Soriatane was added about 1-2 weeks ago.  He reported some improvement of his dermatology problems. ED Course: Chest x-ray showed resolving left middle lobe pneumonia.  Review of Systems: As per HPI otherwise 14 point review of systems negative.   Past Medical History:  Diagnosis Date  . Lichen planus   . Methotrexate, long term, current use   . Psoriasis   . Thyroid disease     Past Surgical History:  Procedure Laterality Date  . CATARACT EXTRACTION Left    2019     reports that he quit smoking about 39 years ago. His smoking use included cigarettes. He has never used smokeless tobacco. He reports current alcohol use of about 1.0 standard drink of alcohol per week. He reports that he does not use drugs.  Allergies  Allergen Reactions  . Gold Itching  . Mixed Grasses     Family History  Problem Relation Age of Onset  . Diabetes Mother   . Stroke Mother      Prior to Admission medications   Medication Sig Start Date End Date Taking? Authorizing Provider  acitretin (SORIATANE) 25 MG capsule Take 25 mg by mouth daily. 02/18/19   [provider]  ADVAIR DISKUS 100-50 MCG/DOSE AEPB INHALE 1 PUFF INTO THE LUNGS IN THE MORNING AND AT BEDTIME. 01/04/20   Maximiano Coss, NP  albuterol (PROVENTIL) (2.5 MG/3ML) 0.083% nebulizer solution Take 3 mLs (  2.5 mg total) by nebulization every 6 (six) hours as needed for wheezing or shortness of breath. 01/08/20   Parrett, Fonnie Mu, NP  albuterol (VENTOLIN HFA) 108 (90 Base) MCG/ACT inhaler Inhale 2 puffs into the lungs every 6 (six) hours as needed for wheezing or shortness of breath. 10/17/19   Posey Boyer, MD  augmented betamethasone dipropionate (DIPROLENE-AF) 0.05 % cream  10/01/19   [provider]  benzonatate  (TESSALON) 100 MG capsule Take 1 capsule (100 mg total) by mouth 3 (three) times daily as needed for cough. 12/31/19   Wendie Agreste, MD  budesonide-formoterol [facility]) 160-4.5 MCG/ACT inhaler Inhale 2 puffs into the lungs 2 (two) times daily. 01/08/20   Parrett, Fonnie Mu, NP  dextromethorphan 15 MG/5ML syrup Take 10 mLs (30 mg total) by mouth 4 (four) times daily as needed for cough. 03/31/19   Maximiano Coss, NP  famotidine (PEPCID) 40 MG tablet Take by mouth. 12/02/19 03/01/20  [provider]  folic acid (FOLVITE) 1 MG tablet Take 1 mg by mouth daily. 07/08/18   [provider]  hydrOXYzine (ATARAX/VISTARIL) 25 MG tablet Take 25 mg by mouth daily. 10/01/19   [provider]  ipratropium (ATROVENT) 0.06 % nasal spray Place 2 sprays into both nostrils 3 (three) times daily. 12/02/19   [provider]  ipratropium (ATROVENT) 0.06 % nasal spray Place into the nose. 12/02/19 01/01/20  [provider]  levofloxacin (LEVAQUIN) 750 MG tablet Take 1 tablet (750 mg total) by mouth daily. 01/01/20   Faustino Congress, NP  levothyroxine (SYNTHROID, LEVOTHROID) 100 MCG tablet Take 100 mcg by mouth daily. 05/06/11   [provider]  methotrexate (RHEUMATREX) 2.5 MG tablet  08/30/18   [provider]  montelukast (SINGULAIR) 10 MG tablet TAKE 1 TABLET BY MOUTH EVERYDAY AT BEDTIME 01/07/20   Maximiano Coss, NP  mupirocin ointment Drue Stager) 2 %  04/28/19   [provider]  mycophenolate (CELLCEPT) 500 MG tablet Take by mouth 2 (two) times daily.    [provider]  omeprazole (PRILOSEC) 40 MG capsule Take 1 capsule (40 mg total) by mouth daily. 11/21/19   Posey Boyer, MD  triamcinolone cream (KENALOG) 0.1 %  08/29/18   [provider]  triamcinolone ointment (KENALOG) 0.1 % APPLY TO AFFECTED AREA TWICE A DAY 08/12/19   [provider]    Physical Exam: Vitals:   01/22/20 1415 01/22/20 1418 01/22/20 1433 01/22/20  1521  BP: (!) 121/92  121/83 106/73  Pulse: (!) 153  (!) 108 84  Resp: (!) 21  16 18   Temp:      TempSrc:      SpO2: 97%  98% 97%  Weight:  135.6 kg    Height:  6' (1.829 m)      Constitutional: NAD, calm, comfortable Vitals:   01/22/20 1415 01/22/20 1418 01/22/20 1433 01/22/20 1521  BP: (!) 121/92  121/83 106/73  Pulse: (!) 153  (!) 108 84  Resp: (!) 21  16 18   Temp:      TempSrc:      SpO2: 97%  98% 97%  Weight:  135.6 kg    Height:  6' (1.829 m)     Eyes: PERRL, lids and conjunctivae normal ENMT: Mucous membranes are moist. Posterior pharynx clear of any exudate or lesions.Normal dentition.  Neck: normal, supple, no masses, no thyromegaly Respiratory: Diffused wheezing and crackles.  Increased respiratory effort. No accessory muscle use.  Cardiovascular: Regular rate and rhythm, no murmurs /  rubs / gallops.  1+ extremity edema. 2+ pedal pulses. No carotid bruits.  Abdomen: no tenderness, no masses palpated. No hepatosplenomegaly. Bowel sounds positive.  Musculoskeletal: no clubbing / cyanosis. No joint deformity upper and lower extremities. Good ROM, no contractures. Normal muscle tone.  Skin: no rashes, lesions, ulcers. No induration Neurologic: CN 2-12 grossly intact. Sensation intact, DTR normal. Strength 5/5 in all 4.  Psychiatric: Normal judgment and insight. Alert and oriented x 3. Normal mood.    Labs on Admission: I have personally reviewed following labs and imaging studies  CBC: Recent Labs  Lab 01/22/20 1330  WBC 8.7  NEUTROABS 6.4  HGB 13.2  HCT 41.3  MCV 91.8  PLT 378   Basic Metabolic Panel: Recent Labs  Lab 01/22/20 1330  NA 137  K 3.7  CL 106  CO2 20*  GLUCOSE 105*  BUN 10  CREATININE 1.26*  CALCIUM 8.6*  MG 2.1   GFR: Estimated Creatinine Clearance: 76.7 mL/min (A) (by C-G formula based on SCr of 1.26 mg/dL (H)). Liver Function Tests: Recent Labs  Lab 01/22/20 1330  AST 15  ALT 18  ALKPHOS 44  BILITOT 0.9  PROT 6.0*  ALBUMIN  3.2*   No results for input(s): LIPASE, AMYLASE in the last 168 hours. No results for input(s): AMMONIA in the last 168 hours. Coagulation Profile: No results for input(s): INR, PROTIME in the last 168 hours. Cardiac Enzymes: No results for input(s): CKTOTAL, CKMB, CKMBINDEX, TROPONINI in the last 168 hours. BNP (last 3 results) No results for input(s): PROBNP in the last 8760 hours. HbA1C: No results for input(s): HGBA1C in the last 72 hours. CBG: No results for input(s): GLUCAP in the last 168 hours. Lipid Profile: No results for input(s): CHOL, HDL, LDLCALC, TRIG, CHOLHDL, LDLDIRECT in the last 72 hours. Thyroid Function Tests: No results for input(s): TSH, T4TOTAL, FREET4, T3FREE, THYROIDAB in the last 72 hours. Anemia Panel: No results for input(s): VITAMINB12, FOLATE, FERRITIN, TIBC, IRON, RETICCTPCT in the last 72 hours. Urine analysis:    Component Value Date/Time   BILIRUBINUR negative 08/10/2016 1207   KETONESUR negative 08/10/2016 1207   PROTEINUR negative 08/10/2016 1207   UROBILINOGEN 1.0 08/10/2016 1207   NITRITE Negative 08/10/2016 1207   LEUKOCYTESUR Negative 08/10/2016 1207    Radiological Exams on Admission: DG Chest Portable 1 View  Result Date: 01/22/2020 CLINICAL DATA:  Cough for weeks EXAM: PORTABLE CHEST 1 VIEW COMPARISON:  01/01/2020 FINDINGS: Normal heart size, mediastinal contours, and pulmonary vascularity. Lungs clear. Resolution of LEFT mid lung infiltrates since previous exam. No pleural effusion or pneumothorax. Osseous structures unremarkable. IMPRESSION: Resolution of previously identified pneumonia in LEFT mid lung. No new abnormalities. Electronically Signed   By: Lavonia Dana M.D.   On: 01/22/2020 13:50    EKG: Independently reviewed.  Rapid A. fib, no acute ST changes  Assessment/Plan Active Problems:   * No active hospital problems. *  (please populate well all problems here in Problem List. (For example, if patient is on BP meds at home  and you resume or decide to hold them, it is a problem that needs to be her. Same for CAD, COPD, HLD and so on)  A. fib with RVR -On Cardizem drip -CHADS2=1, will start aspirin -Echo when HR more controlled. -TSH  Acute persistent asthma -No history of asthma or COPD before.  Lung function test this year not consistent with COPD. -Given that patient has been on immune suppressor for lichens dermatitis, will consider atypical infection.  Send an aspergillosis serum study, also send QuantiFERON.  Consult pulmonology. -Treatment wise, short course of IV steroid.  Patient completed 7 days of Levaquin 2 weeks ago.  We will hold off antibiotics for now. -Follow-up procalcitonin level  Recurrent sinusitis -Rule out aspergillosis related sinusitis -Patient had ENT visit and recent laryngoscopy  Lichen planus/psoriasis -Patient report good response to steroid, and since patient will be on steroid for asthma, will discontinue mycophenolate -Continue Soriatane  GERD -Wife reported the patient has occasional swallowing problems and had a 20 pound weight loss recently 1 to 2 months -Barium swallow, outpatient GI follow-up  Morbid obesity -Outpatient sleep study  Hypothyroidism -Check TSH, continue Synthroid  DVT prophylaxis: Lovenox Code Status: Full code Family Communication: Wife at bedside Disposition Plan: Patient has complicated medical conditions with new onset A. fib and Consults called: Pulm Admission status: PCU for afib drip   Lequita Halt MD [facility] Pager [phone]  01/22/2020, 3:26 PM

## 2020-01-23 ENCOUNTER — Inpatient Hospital Stay (HOSPITAL_COMMUNITY): Payer: Medicare HMO

## 2020-01-23 ENCOUNTER — Inpatient Hospital Stay: Payer: Medicare HMO

## 2020-01-23 DIAGNOSIS — I4891 Unspecified atrial fibrillation: Secondary | ICD-10-CM

## 2020-01-23 DIAGNOSIS — J208 Acute bronchitis due to other specified organisms: Secondary | ICD-10-CM

## 2020-01-23 DIAGNOSIS — R0602 Shortness of breath: Secondary | ICD-10-CM

## 2020-01-23 DIAGNOSIS — I340 Nonrheumatic mitral (valve) insufficiency: Secondary | ICD-10-CM

## 2020-01-23 LAB — BASIC METABOLIC PANEL
Anion gap: 7 (ref 5–15)
BUN: 12 mg/dL (ref 8–23)
CO2: 19 mmol/L — ABNORMAL LOW (ref 22–32)
Calcium: 8.8 mg/dL — ABNORMAL LOW (ref 8.9–10.3)
Chloride: 106 mmol/L (ref 98–111)
Creatinine, Ser: 1.25 mg/dL — ABNORMAL HIGH (ref 0.61–1.24)
GFR calc Af Amer: >60 mL/min (ref >60)
GFR calc non Af Amer: 58 mL/min — ABNORMAL LOW (ref >60)
Glucose, Bld: 152 mg/dL — ABNORMAL HIGH (ref 70–99)
Potassium: 4.3 mmol/L (ref 3.5–5.1)
Sodium: 132 mmol/L — ABNORMAL LOW (ref 135–145)

## 2020-01-23 LAB — RESPIRATORY PANEL BY PCR

## 2020-01-23 LAB — ECHOCARDIOGRAM COMPLETE
AR max vel: 3.61 cm2
AV Area VTI: 3.68 cm2
AV Area mean vel: 4.06 cm2
AV Mean grad: 7 mmHg
AV Peak grad: 13.5 mmHg
Ao pk vel: 1.84 m/s
Calc EF: 49.5 %
Height: 72 in
Radius: 0.2 cm
S' Lateral: 4.1 cm
Single Plane A2C EF: 53.4 %
Single Plane A4C EF: 44.1 %
Weight: 4784 oz

## 2020-01-23 LAB — CBC
HCT: 40.6 % (ref 39.0–52.0)
Hemoglobin: 12.9 g/dL — ABNORMAL LOW (ref 13.0–17.0)
MCH: 29.1 pg (ref 26.0–34.0)
MCHC: 31.8 g/dL (ref 30.0–36.0)
MCV: 91.4 fL (ref 80.0–100.0)
Platelets: 212 10*3/uL (ref 150–400)
RBC: 4.44 MIL/uL (ref 4.22–5.81)
RDW: 13.8 % (ref 11.5–15.5)
WBC: 5.9 10*3/uL (ref 4.0–10.5)
nRBC: 0 % (ref 0.0–0.2)

## 2020-01-23 LAB — HIV ANTIBODY (ROUTINE TESTING W REFLEX): HIV Screen 4th Generation wRfx: NONREACTIVE

## 2020-01-23 LAB — PROCALCITONIN: Procalcitonin: <0.1 ng/mL

## 2020-01-23 MED ORDER — MOMETASONE FURO-FORMOTEROL FUM 200-5 MCG/ACT IN AERO
2.0000 | INHALATION_SPRAY | Freq: Two times a day (BID) | RESPIRATORY_TRACT | Status: DC
  Administered 2020-01-23 – 2020-01-27 (×8): 2 via RESPIRATORY_TRACT
  Filled 2020-01-23: qty 8.8

## 2020-01-23 MED ORDER — LEVALBUTEROL TARTRATE 45 MCG/ACT IN AERO
2.0000 | INHALATION_SPRAY | RESPIRATORY_TRACT | Status: DC | PRN
  Administered 2020-01-23 – 2020-01-26 (×3): 2 via RESPIRATORY_TRACT
  Filled 2020-01-23: qty 15

## 2020-01-23 MED ORDER — LEVALBUTEROL TARTRATE 45 MCG/ACT IN AERO
2.0000 | INHALATION_SPRAY | Freq: Four times a day (QID) | RESPIRATORY_TRACT | Status: DC
  Administered 2020-01-23: 2 via RESPIRATORY_TRACT
  Filled 2020-01-23: qty 15

## 2020-01-23 MED ORDER — APIXABAN 5 MG PO TABS
5.0000 mg | ORAL_TABLET | Freq: Two times a day (BID) | ORAL | Status: DC
  Administered 2020-01-23 – 2020-01-27 (×8): 5 mg via ORAL
  Filled 2020-01-23 (×10): qty 1

## 2020-01-23 MED ORDER — DEXTROMETHORPHAN POLISTIREX ER 30 MG/5ML PO SUER
30.0000 mg | Freq: Two times a day (BID) | ORAL | Status: DC | PRN
  Administered 2020-01-24 (×2): 30 mg via ORAL
  Filled 2020-01-23 (×4): qty 5

## 2020-01-23 MED ORDER — IPRATROPIUM BROMIDE HFA 17 MCG/ACT IN AERS
2.0000 | INHALATION_SPRAY | Freq: Four times a day (QID) | RESPIRATORY_TRACT | Status: DC
  Administered 2020-01-23 (×2): 2 via RESPIRATORY_TRACT
  Filled 2020-01-23: qty 12.9

## 2020-01-23 MED ORDER — DOXYCYCLINE HYCLATE 100 MG PO TABS
100.0000 mg | ORAL_TABLET | Freq: Two times a day (BID) | ORAL | Status: DC
  Administered 2020-01-23 – 2020-01-27 (×8): 100 mg via ORAL
  Filled 2020-01-23 (×10): qty 1

## 2020-01-23 MED ORDER — GUAIFENESIN ER 600 MG PO TB12
1200.0000 mg | ORAL_TABLET | Freq: Two times a day (BID) | ORAL | Status: DC
  Administered 2020-01-23 – 2020-01-27 (×8): 1200 mg via ORAL
  Filled 2020-01-23 (×8): qty 2

## 2020-01-23 MED ORDER — PERFLUTREN LIPID MICROSPHERE
1.0000 mL | INTRAVENOUS | Status: AC | PRN
  Administered 2020-01-23: 2 mL via INTRAVENOUS
  Filled 2020-01-23: qty 10

## 2020-01-23 NOTE — Evaluation (Signed)
Physical Therapy Evaluation Patient Details Name: Todd Harrison MRN: [id number] DOB: [birth year] Today's Date: 01/23/2020   History of Present Illness  74 yo male with onset of SOB after recent bout of PNA was brought to ED, with a-fib and O2 sat drops on 2L O2.  Diagnosed with tracheobronchitis, asthma and possible aspirations.  PMHx:  planus lichens, PNA,  hypothyroidism, wheezing, asthma, swallowing difficulty with wgt loss  Clinical Impression  Pt was unable to safely mobilize much today due to medical instability.  Noted Supine vitals: 148/79, pulse 105, sat 95%; sitting 159/113, pulse 115-125 and sat 93%; standing pulse 120, BP 175/156, sat 89%.  Reported to nursing and will follow them as PT sees pt to recover safety and independence of gait.     Follow Up Recommendations Home health PT;Supervision for mobility/OOB    Equipment Recommendations  None recommended by PT    Recommendations for Other Services       Precautions / Restrictions Precautions Precautions: Fall;Other (comment) (monitor O2 sats and pulses) Precaution Comments: a-fib, hypoxia, ck BP Restrictions Weight Bearing Restrictions: No Other Position/Activity Restrictions: hypertensive with mobiliy      Mobility  Bed Mobility Overal bed mobility: Needs Assistance Bed Mobility: Supine to Sit;Sit to Supine     Supine to sit: Supervision Sit to supine: Supervision   General bed mobility comments: supervised due to his vitals  Transfers Overall transfer level: Needs assistance Equipment used: 1 person hand held assist Transfers: Sit to/from Stand Sit to Stand: Min guard         General transfer comment: min guard for safety  Ambulation/Gait             General Gait Details: deferred due to standing BP  Stairs            Wheelchair Mobility    Modified Rankin (Stroke Patients Only)       Balance Overall balance assessment: No apparent balance deficits (not formally assessed)                                            Pertinent Vitals/Pain Pain Assessment: No/denies pain    Home Living Family/patient expects to be discharged to:: Private residence Living Arrangements: Spouse/significant other Available Help at Discharge: Family;Available 24 hours/day Type of Home: House       Home Layout: One level Home Equipment: None Additional Comments: works in [employer], production, driving and independent    Prior Function Level of Independence: Independent               Hand Dominance   Dominant Hand: Right    Extremity/Trunk Assessment   Upper Extremity Assessment Upper Extremity Assessment: Overall WFL for tasks assessed    Lower Extremity Assessment Lower Extremity Assessment: Overall WFL for tasks assessed    Cervical / Trunk Assessment Cervical / Trunk Assessment: Normal  Communication   Communication: No difficulties  Cognition Arousal/Alertness: Awake/alert Behavior During Therapy: WFL for tasks assessed/performed Overall Cognitive Status: Within Functional Limits for tasks assessed                                        General Comments General comments (skin integrity, edema, etc.): Vitals were checked with mobility, and noted Supine vitals:  148/79, pulse 105, sat 95%;  sitting 159/113, pulse 115-125 and sat 93%;  standing pulse 120, BP 175/156, sat 89%    Exercises     Assessment/Plan    PT Assessment Patient needs continued PT services  PT Problem List Decreased range of motion;Decreased activity tolerance;Decreased balance;Decreased mobility;Decreased knowledge of use of DME;Decreased safety awareness;Cardiopulmonary status limiting activity       PT Treatment Interventions DME instruction;Gait training;Stair training;Therapeutic activities;Functional mobility training;Therapeutic exercise;Balance training;Neuromuscular re-education;Patient/family education    PT Goals (Current goals  can be found in the Care Plan section)  Acute Rehab PT Goals Patient Stated Goal: to get back to his routine PT Goal Formulation: With patient Time For Goal Achievement: 01/30/20 Potential to Achieve Goals: Good    Frequency Min 3X/week   Barriers to discharge   home with just wife to monitor his vitals    Co-evaluation               AM-PAC PT "6 Clicks" Mobility  Outcome Measure Help needed turning from your back to your side while in a flat bed without using bedrails?: None Help needed moving from lying on your back to sitting on the side of a flat bed without using bedrails?: A Little Help needed moving to and from a bed to a chair (including a wheelchair)?: A Little Help needed standing up from a chair using your arms (e.g., wheelchair or bedside chair)?: A Little Help needed to walk in hospital room?: A Little Help needed climbing 3-5 steps with a railing? : A Little 6 Click Score: 19    End of Session Equipment Utilized During Treatment: Gait belt;Oxygen Activity Tolerance: Treatment limited secondary to medical complications (Comment) Patient left: in bed;with call bell/phone within reach;with bed alarm set Nurse Communication: Mobility status PT Visit Diagnosis: Unsteadiness on feet (R26.81);Other (comment) (HTN, O2 desaturation, a-fib)    Time: 4540-9811 PT Time Calculation (min) (ACUTE ONLY): 21 min   Charges:   PT Evaluation $PT Eval Moderate Complexity: 1 Mod         Ramond Dial 01/23/2020, 5:59 PM  Mee Hives, PT MS Acute Rehab Dept. Number: [place] [facility] [place]

## 2020-01-23 NOTE — Progress Notes (Signed)
   01/23/20 2235  Assess: MEWS Score  Temp 98.1 F (36.7 C)  BP (!) 142/98  Pulse Rate (!) 108  ECG Heart Rate 99  Resp (!) 21  Level of Consciousness Alert  Assess: MEWS Score  MEWS Temp 0  MEWS Systolic 0  MEWS Pulse 0  MEWS RR 1  MEWS LOC 0  MEWS Score 1  MEWS Score Color Green  Assess: if the MEWS score is Yellow or Red  Were vital signs taken at a resting state? Yes  Focused Assessment No change from prior assessment  Early Detection of Sepsis Score *See Row Information* Low  MEWS guidelines implemented *See Row Information* No, previously yellow, continue vital signs every 4 hours  Treat  MEWS Interventions Administered scheduled meds/treatments  Pain Scale 0-10  Pain Score 0  Take Vital Signs  Increase Vital Sign Frequency  Yellow: Q 2hr X 2 then Q 4hr X 2, if remains yellow, continue Q 4hrs  Escalate  MEWS: Escalate Yellow: discuss with charge nurse/RN and consider discussing with provider and RRT  Notify: Charge Nurse/RN  Name of Charge Nurse/RN Notified Jeani Hawking, RN  Date Charge Nurse/RN Notified 01/23/20  Time Charge Nurse/RN Notified 2300  Notify: Provider  Provider Name/Title  (Chronic, Not notified MD)  Document  Patient Outcome Not stable and remains on department

## 2020-01-23 NOTE — Consult Note (Addendum)
Cardiology Consultation:   Patient ID: Todd Harrison; [id number]; [birth year]   Admit date: 01/22/2020 Date of Consult: 01/23/2020  Primary Care Provider: Maximiano Coss, NP Primary Cardiologist: New to [facility]   Patient Profile:   Todd Harrison is a 74 y.o. male with a hx of lichen planus with methotrexate and chronic prednisone use, and hypothyroidism who is being seen today for the evaluation of new onset AF with RVR at the request of Dr. Darrick Meigs.  History of Present Illness:   Todd Harrison is a 74yo M with a hx as stated above who presented to [facility] ED with a progressively worsening cough with wheezing for approximately 5 weeks. He was seen by pulmonology three weeks ago and was dx with PNA and acute asthma exacerbation. He was treated with 7 days of Levaquin and a steroid pack. He reports that symptoms only modestly improved and states that he then developed more wheezing with occasional white sputum production. He was also seen by ENT and underwent scoping and was told that he had laryngeal swelling felt to be secondary to GERD.   On follow up appointment with pulmonary and found to be in AF with RVR with rates in  the 130 range and he was therefore sen to the ED for further evaluation.   On arrival, EKG showed AF with RVR. He was started on IV Diltiazem along with ASA given a CHA2DS2VASc at 1>>however he has evidence of aortic calcifications on echoardiogram. CXR showed resolving left middle lobe PNA. Given treatment with immune suppressors, pulmonology was consulted for further assistance. Creatinine is stable at 1.25. TSH at 1.171. Echocardiogram with 55-60%, mild to moderate mitral valve regurgitation, moderate calcification of the aortic valve, mild to moderate aortic valve sclerosis/calcification is present, without any evidence of aortic stenosis and aortic dilatation noted. There is mild dilatation of the aortic root, measuring 42 mm. He denies chest pain or other anginal symptoms. No  LE edema, palpitations, dizziness or syncope. He has dyspnea and SOB due his acute illness.   Past Medical History:  Diagnosis Date  . Lichen planus   . Methotrexate, long term, current use   . Psoriasis   . Thyroid disease     Past Surgical History:  Procedure Laterality Date  . CATARACT EXTRACTION Left    2019     Prior to Admission medications   Medication Sig Start Date End Date Taking? Authorizing Provider  albuterol (PROVENTIL) (2.5 MG/3ML) 0.083% nebulizer solution Take 3 mLs (2.5 mg total) by nebulization every 6 (six) hours as needed for wheezing or shortness of breath. 01/08/20  Yes Parrett, Tammy S, NP  albuterol (VENTOLIN HFA) 108 (90 Base) MCG/ACT inhaler Inhale 2 puffs into the lungs every 6 (six) hours as needed for wheezing or shortness of breath. 10/17/19  Yes Posey Boyer, MD  B Complex Vitamins (VITAMIN B COMPLEX PO) Take 1 tablet by mouth daily.   Yes [provider]  betamethasone dipropionate (DIPROLENE) 0.05 % ointment Apply 1 application topically 2 (two) times daily.    Yes [provider]  budesonide-formoterol (SYMBICORT) 160-4.5 MCG/ACT inhaler Inhale 2 puffs into the lungs 2 (two) times daily. 01/08/20  Yes Parrett, Tammy S, NP  ISOtretinoin (ACCUTANE) 40 MG capsule Take 40 mg by mouth daily.  01/13/20  Yes [provider]  levothyroxine (SYNTHROID, LEVOTHROID) 100 MCG tablet Take 100 mcg by mouth daily. 05/06/11  Yes [provider]  loratadine (CLARITIN) 10 MG tablet Take 10 mg by mouth  daily.   Yes [provider]  meclizine (ANTIVERT) 25 MG tablet Take 25 mg by mouth 2 (two) times daily.   Yes [provider]  mycophenolate (CELLCEPT) 500 MG tablet Take 1,500 mg by mouth See admin instructions. Take 3 tablets by mouth  in the morning and three tablets by mouth at night   Yes [provider]  omeprazole (PRILOSEC) 40 MG capsule Take 1 capsule (40 mg total) by mouth daily. 11/21/19  Yes Posey Boyer, MD  ADVAIR DISKUS 100-50 MCG/DOSE AEPB INHALE 1 PUFF INTO THE LUNGS IN THE MORNING AND AT BEDTIME. Patient not taking: Reported on 01/22/2020 01/04/20   Maximiano Coss, NP  benzonatate (TESSALON) 100 MG capsule Take 1 capsule (100 mg total) by mouth 3 (three) times daily as needed for cough. Patient not taking: Reported on 01/22/2020 12/31/19   Wendie Agreste, MD  dextromethorphan 15 MG/5ML syrup Take 10 mLs (30 mg total) by mouth 4 (four) times daily as needed for cough. Patient not taking: Reported on 01/22/2020 03/31/19   Maximiano Coss, NP  ipratropium (ATROVENT) 0.06 % nasal spray Place into the nose. 12/02/19 01/01/20  [provider]  levofloxacin (LEVAQUIN) 750 MG tablet Take 1 tablet (750 mg total) by mouth daily. Patient not taking: Reported on 01/22/2020 01/01/20   Faustino Congress, NP  montelukast (SINGULAIR) 10 MG tablet TAKE 1 TABLET BY MOUTH EVERYDAY AT BEDTIME Patient not taking: Reported on 01/22/2020 01/07/20   Maximiano Coss, NP    Inpatient Medications: Scheduled Meds: . acitretin  25 mg Oral Daily  . aspirin  81 mg Oral Daily  . enoxaparin (LOVENOX) injection  40 mg Subcutaneous Q24H  . folic acid  1 mg Oral Daily  . hydrOXYzine  25 mg Oral Daily  . ipratropium  2 puff Inhalation Q6H  . ipratropium  2 spray Each Nare TID  . levalbuterol  2 puff Inhalation Q6H  . levothyroxine  100 mcg Oral Q24H  . methylPREDNISolone acetate  40 mg Intra-articular Once  . methylPREDNISolone (SOLU-MEDROL) injection  40 mg Intravenous Q12H  . montelukast  5 mg Oral QHS  . pantoprazole  40 mg Oral Daily  . triamcinolone cream   Topical BID   Continuous Infusions: . diltiazem (CARDIZEM) infusion 15 mg/hr (01/23/20 1115)   PRN Meds: acetaminophen **OR** acetaminophen, benzonatate, dextromethorphan, levalbuterol  Allergies:    Allergies  Allergen Reactions  . Gold Itching  . Mixed Grasses     unknown    Social History:   Social History   Socioeconomic History  .  Marital status: Married    Spouse name: Not on file  . Number of children: Not on file  . Years of education: Not on file  . Highest education level: Not on file  Occupational History  . Not on file  Tobacco Use  . Smoking status: Former Smoker    Types: Cigarettes    Quit date: 1982    Years since quitting: 39.7  . Smokeless tobacco: Never Used  Vaping Use  . Vaping Use: Never assessed  Substance and Sexual Activity  . Alcohol use: Yes    Alcohol/week: 1.0 standard drink    Types: 1 Glasses of wine per week  . Drug use: No  . Sexual activity: Not on file  Other Topics Concern  . Not on file  Social History Narrative  . Not on file   Social Determinants of Health   Financial Resource Strain:   . Difficulty of Paying Living  Expenses: Not on file  Food Insecurity:   . Worried About Charity fundraiser in the Last Year: Not on file  . Ran Out of Food in the Last Year: Not on file  Transportation Needs:   . Lack of Transportation (Medical): Not on file  . Lack of Transportation (Non-Medical): Not on file  Physical Activity:   . Days of Exercise per Week: Not on file  . Minutes of Exercise per Session: Not on file  Stress:   . Feeling of Stress : Not on file  Social Connections:   . Frequency of Communication with Friends and Family: Not on file  . Frequency of Social Gatherings with Friends and Family: Not on file  . Attends Religious Services: Not on file  . Active Member of Clubs or Organizations: Not on file  . Attends Archivist Meetings: Not on file  . Marital Status: Not on file  Intimate Partner Violence:   . Fear of Current or Ex-Partner: Not on file  . Emotionally Abused: Not on file  . Physically Abused: Not on file  . Sexually Abused: Not on file    Family History:   Family History  Problem Relation Age of Onset  . Diabetes Mother   . Stroke Mother    Family Status:  Family Status  Relation Name Status  . Mother  Deceased  . Father   Deceased    ROS:  Please see the history of present illness.  All other ROS reviewed and negative.     Physical Exam/Data:   Vitals:   01/23/20 1115 01/23/20 1312 01/23/20 1500 01/23/20 1541  BP: 138/77 (!) 172/116  118/69  Pulse: (!) 125 65  (!) 108  Resp: 20 (!) 30  (!) 28  Temp:  98.1 F (36.7 C)  97.8 F (36.6 C)  TempSrc:  Oral  Oral  SpO2: 91% 94% 95% 94%  Weight:      Height:        Intake/Output Summary (Last 24 hours) at 01/23/2020 1559 Last data filed at 01/23/2020 1241 Gross per 24 hour  Intake 20 ml  Output 1450 ml  Net -1430 ml   Filed Weights   01/22/20 1418  Weight: 135.6 kg   Body mass index is 40.55 kg/m.   General: Obese, NAD Neck: Negative for carotid bruits. No JVD Lungs: Bilateral wheezes and crackles in upper and lower lobes. Breathing is unlabored. Cardiovascular: Irregularly irregular. No murmurs, rubs, gallops, or LV heave appreciated. Abdomen: Soft, non-tender, non-distended. No obvious aibdominal masses. Extremities: No edema. Radial pulses 2+ bilaterally Neuro: Alert and oriented. No focal defcits. No facial asymmetry. MAE spontaneously. Psych: Responds to questions appropriately with normal affect.    EKG:  The EKG was personally reviewed and demonstrates: 01/22/20 AF with RVR, HR 141bpm  Telemetry:  Telemetry was personally reviewed and demonstrates: 01/23/20 AF with rates in the 90-100 range   Relevant CV Studies:  Echocardiogram  01/23/20:  1. Left ventricular ejection fraction, by estimation, is 55 to 60%. The  left ventricle has normal function. The left ventricle has no regional  wall motion abnormalities. There is mild concentric left ventricular  hypertrophy. Left ventricular diastolic  function could not be evaluated.  2. Right ventricular systolic function is normal. The right ventricular  size is normal. Tricuspid regurgitation signal is inadequate for assessing  PA pressure.  3. The mitral valve is grossly normal.  Mild to moderate mitral valve  regurgitation. No evidence of mitral stenosis.  4. The aortic valve is tricuspid. There is moderate calcification of the  aortic valve. There is moderate thickening of the aortic valve. Aortic  valve regurgitation is not visualized. Mild to moderate aortic valve  sclerosis/calcification is present,  without any evidence of aortic stenosis.  5. Aortic dilatation noted. There is mild dilatation of the aortic root,  measuring 42 mm.  6. The inferior vena cava is dilated in size with >50% respiratory  variability, suggesting right atrial pressure of 8 mmHg.   Laboratory Data:  Chemistry Recent Labs  Lab 01/22/20 1330 01/23/20 0535  NA 137 132*  K 3.7 4.3  CL 106 106  CO2 20* 19*  GLUCOSE 105* 152*  BUN 10 12  CREATININE 1.26* 1.25*  CALCIUM 8.6* 8.8*  GFRNONAA 57* 58*  GFRAA >60 >60  ANIONGAP 11 7    Total Protein  Date Value Ref Range Status  01/22/2020 6.0 (L) 6.5 - 8.1 g/dL Final  01/01/2020 6.1 6.0 - 8.5 g/dL Final   Albumin  Date Value Ref Range Status  01/22/2020 3.2 (L) 3.5 - 5.0 g/dL Final  01/01/2020 3.8 3.7 - 4.7 g/dL Final   AST  Date Value Ref Range Status  01/22/2020 15 15 - 41 U/L Final   ALT  Date Value Ref Range Status  01/22/2020 18 0 - 44 U/L Final   Alkaline Phosphatase  Date Value Ref Range Status  01/22/2020 44 38 - 126 U/L Final   Total Bilirubin  Date Value Ref Range Status  01/22/2020 0.9 0.3 - 1.2 mg/dL Final   Bilirubin Total  Date Value Ref Range Status  01/01/2020 0.8 0.0 - 1.2 mg/dL Final   Hematology Recent Labs  Lab 01/22/20 1330 01/23/20 0535  WBC 8.7 5.9  RBC 4.50 4.44  HGB 13.2 12.9*  HCT 41.3 40.6  MCV 91.8 91.4  MCH 29.3 29.1  MCHC 32.0 31.8  RDW 13.7 13.8  PLT 231 212   Cardiac EnzymesNo results for input(s): TROPONINI in the last 168 hours. No results for input(s): TROPIPOC in the last 168 hours.  BNP Recent Labs  Lab 01/22/20 1322  BNP 184.6*    DDimer No results for  input(s): DDIMER in the last 168 hours. TSH:  Lab Results  Component Value Date   TSH 1.171 01/22/2020   Lipids:No results found for: CHOL, HDL, LDLCALC, LDLDIRECT, TRIG, CHOLHDL HgbA1c:No results found for: HGBA1C  Radiology/Studies:  CT Chest Wo Contrast  Result Date: 01/22/2020 CLINICAL DATA:  Persistent cough EXAM: CT CHEST WITHOUT CONTRAST TECHNIQUE: Multidetector CT imaging of the chest was performed following the standard protocol without IV contrast. COMPARISON:  Radiograph same day FINDINGS: Cardiovascular: There are no significant vascular findings. Scattered vascular calcifications are seen within the aorta. Coronary artery calcifications are seen. The heart size is normal. There is no pericardial thickening or effusion. Mediastinum/Nodes: There are no enlarged mediastinal, hilar or axillary lymph nodes. The thyroid gland, trachea and esophagus demonstrate no significant findings. Lungs/Pleura: Rounded patchy airspace opacity is seen within the posterior left lung base with mild bronchial wall thickening. There is small tree-in-bud opacities also noted within the right lung base. Upper abdomen: The visualized portion of the upper abdomen is unremarkable. Musculoskeletal/Chest wall: There is no chest wall mass or suspicious osseous finding. No acute osseous abnormality IMPRESSION: Patchy airspace opacity with tree-in-bud opacities at the posterior left lung base which could be due to resolving infectious etiology from August 2021 or inflammatory process. Electronically Signed   By: Ebony Cargo.D.  On: 01/22/2020 17:56   DG Chest Portable 1 View  Result Date: 01/22/2020 CLINICAL DATA:  Cough for weeks EXAM: PORTABLE CHEST 1 VIEW COMPARISON:  01/01/2020 FINDINGS: Normal heart size, mediastinal contours, and pulmonary vascularity. Lungs clear. Resolution of LEFT mid lung infiltrates since previous exam. No pleural effusion or pneumothorax. Osseous structures unremarkable. IMPRESSION:  Resolution of previously identified pneumonia in LEFT mid lung. No new abnormalities. Electronically Signed   By: Lavonia Dana M.D.   On: 01/22/2020 13:50   ECHOCARDIOGRAM COMPLETE  Result Date: 01/23/2020    ECHOCARDIOGRAM REPORT   Patient Name:   Todd Harrison Date of Exam: 01/23/2020 Medical Rec #:  [id number]       Height:       72.0 in Accession #:    [id number]      Weight:       299.0 lb Date of Birth:  Jan 29, 1946      BSA:          2.528 m Patient Age:    49 years        BP:           115/77 mmHg Patient Gender: M               HR:           110 bpm. Exam Location:  Inpatient Procedure: 2D Echo, Cardiac Doppler, Color Doppler and Intracardiac            Opacification Agent Indications:    R06.02 SOB; I48.91* Unspeicified atrial fibrillation  History:        Patient has no prior history of Echocardiogram examinations.                 Abnormal ECG, Arrythmias:Atrial Fibrillation;                 Signs/Symptoms:Dyspnea. Pneumonia.  Sonographer:    Roseanna Rainbow RDCS Referring Phys: [id number] [place] Comments: Technically difficult study due to poor echo windows. Image acquisition challenging due to respiratory motion. Imaging difficult due to respiratory difficulty. IMPRESSIONS  1. Left ventricular ejection fraction, by estimation, is 55 to 60%. The left ventricle has normal function. The left ventricle has no regional wall motion abnormalities. There is mild concentric left ventricular hypertrophy. Left ventricular diastolic function could not be evaluated.  2. Right ventricular systolic function is normal. The right ventricular size is normal. Tricuspid regurgitation signal is inadequate for assessing PA pressure.  3. The mitral valve is grossly normal. Mild to moderate mitral valve regurgitation. No evidence of mitral stenosis.  4. The aortic valve is tricuspid. There is moderate calcification of the aortic valve. There is moderate thickening of the aortic valve. Aortic valve regurgitation is  not visualized. Mild to moderate aortic valve sclerosis/calcification is present, without any evidence of aortic stenosis.  5. Aortic dilatation noted. There is mild dilatation of the aortic root, measuring 42 mm.  6. The inferior vena cava is dilated in size with >50% respiratory variability, suggesting right atrial pressure of 8 mmHg. FINDINGS  Left Ventricle: Left ventricular ejection fraction, by estimation, is 55 to 60%. The left ventricle has normal function. The left ventricle has no regional wall motion abnormalities. Definity contrast agent was given IV to delineate the left ventricular  endocardial borders. The left ventricular internal cavity size was normal in size. There is mild concentric left ventricular hypertrophy. Left ventricular diastolic function could not be evaluated due to atrial fibrillation. Left ventricular diastolic function  could not be evaluated. Right Ventricle: The right ventricular size is normal. No increase in right ventricular wall thickness. Right ventricular systolic function is normal. Tricuspid regurgitation signal is inadequate for assessing PA pressure. Left Atrium: Left atrial size was normal in size. Right Atrium: Right atrial size was normal in size. Pericardium: Trivial pericardial effusion is present. Presence of pericardial fat pad. Mitral Valve: The mitral valve is grossly normal. Mild to moderate mitral valve regurgitation. No evidence of mitral valve stenosis. Tricuspid Valve: The tricuspid valve is grossly normal. Tricuspid valve regurgitation is trivial. No evidence of tricuspid stenosis. Aortic Valve: The aortic valve is tricuspid. There is moderate calcification of the aortic valve. There is moderate thickening of the aortic valve. Aortic valve regurgitation is not visualized. Mild to moderate aortic valve sclerosis/calcification is present, without any evidence of aortic stenosis. Aortic valve mean gradient measures 7.0 mmHg. Aortic valve peak gradient measures  13.5 mmHg. Aortic valve area, by VTI measures 3.68 cm. Pulmonic Valve: The pulmonic valve was grossly normal. Pulmonic valve regurgitation is not visualized. No evidence of pulmonic stenosis. Aorta: Aortic dilatation noted. There is mild dilatation of the aortic root, measuring 42 mm. Venous: The inferior vena cava is dilated in size with greater than 50% respiratory variability, suggesting right atrial pressure of 8 mmHg. IAS/Shunts: The atrial septum is grossly normal.  LEFT VENTRICLE PLAX 2D LVIDd:         4.60 cm LVIDs:         4.10 cm LV PW:         1.20 cm LV IVS:        1.20 cm LVOT diam:     2.80 cm LV SV:         120 LV SV Index:   48 LVOT Area:     6.16 cm  LV Volumes (MOD) LV vol d, MOD A2C: 62.2 ml LV vol d, MOD A4C: 115.5 ml LV vol s, MOD A2C: 29.0 ml LV vol s, MOD A4C: 64.6 ml LV SV MOD A2C:     33.2 ml LV SV MOD A4C:     115.5 ml LV SV MOD BP:      43.8 ml RIGHT VENTRICLE         IVC TAPSE (M-mode): 1.6 cm  IVC diam: 2.80 cm LEFT ATRIUM             Index       RIGHT ATRIUM           Index LA diam:        4.40 cm 1.74 cm/m  RA Area:     20.80 cm LA Vol (A2C):   86.5 ml 34.22 ml/m RA Volume:   49.40 ml  19.54 ml/m LA Vol (A4C):   73.5 ml 29.08 ml/m LA Biplane Vol: 81.5 ml 32.24 ml/m  AORTIC VALVE AV Area (Vmax):    3.61 cm AV Area (Vmean):   4.06 cm AV Area (VTI):     3.68 cm AV Vmax:           184.00 cm/s AV Vmean:          119.000 cm/s AV VTI:            0.326 m AV Peak Grad:      13.5 mmHg AV Mean Grad:      7.0 mmHg LVOT Vmax:         108.00 cm/s LVOT Vmean:        78.500 cm/s LVOT VTI:  0.195 m LVOT/AV VTI ratio: 0.60  AORTA Ao Root diam: 4.20 cm Ao Asc diam:  4.10 cm MR PISA:        0.25 cm MR PISA Radius: 0.20 cm  SHUNTS                          Systemic VTI:  0.20 m                          Systemic Diam: 2.80 cm Eleonore Chiquito MD Electronically signed by Eleonore Chiquito MD Signature Date/Time: 01/23/2020/11:06:07 AM    Final    Assessment and Plan:   1. New onset atrial  fibrillation with RVR: -Pt presented to the ED with a progressively worsening cough with wheezing for approximately 5 weeks. He was seen by pulmonology three weeks ago and was dx with PNA and acute asthma exacerbation. He was treated with 7 days of Levaquin and a steroid pack. On follow up appointment with pulmonary and found to be in AF with RVR with rates in the 130 range and he was therefore seen to the ED for further evaluation.  -EKG showed AF with RVR with a rate at 141bpm -IV Diltiazem was initiated with HR improvement  -CHA2DS2VASc at 4 with age and aortic calcifications -CXR showed resolving left middle lobe PNA.  -Creatinine is stable at 1.25.  -TSH at 1.171.  -Echocardiogram with 55-60%, mild to moderate mitral valve regurgitation, moderate calcification of the aortic valve, mild to moderate aortic valve sclerosis/calcification is present, without any evidence of aortic stenosis and aortic dilatation noted. -Continue IV diltiazem  -Start Eliquis 5mg  BID and follow CBC/BMET   2. Aortic root dilation: -Mild dilatation of the aortic root, measuring 42 mm.  -Good BP control  -Monitor on repeat study Q2Y  Other issues per primary team include: -Acute asthma -Lichen planus -GERD  For questions or updates, please contact [facility] Please consult [email or web address] for contact info under Cardiology/STEMI.   Lyndel Safe NP-C [facility] Pager: [phone] 01/23/2020 3:59 PM   Personally seen and examined. Agree with above.  In brief, Todd Harrison is a 74 year old male with history lichen planus on methotrexate and chronic prednisone and hypothyroidism who is being seen today for the evaluation of new onset AF with RVR.  Tele personally reviewed and shows Afib with HR 90-100s.  Labs notably for normal TSH 1.17, HgB 12.9, Cr 1.25.   TTE: Normal BiV function, mild-to-moderate MR, mild AoV calcification without stenosis  EXAM: GEN: Comfortable, hoarse voice HEENT:  normal Neck: no JVD, carotid bruits Cardiac: Irregular, no murmur Respiratory:  Diffuse wheezing throughout. Breathing unlabored. Speaking in full sentances GI: Obese, soft, ND MS: no deformity or atrophy Skin: warm and dry, trace pedal edema Neuro:  Alert and Oriented x 3, Strength and sensation are intact Psych: euthymic mood, full affect  Assessment and Plan: Patient with newly diagnosed Afib with RVR. Unknown duration as patient asymptomatic. Rates fairly well controlled on Dilt gtt. CHADs-vasc 2. EF 55-60%. May be difficult to fully rate control with the need for albuterol inhalers for his breathing and steroids with his lichen planus. Recommend continuing the dilt gtt overnight and transition to PO dilt tomorrow. Discussed the benefits and risks of anticoagulation with the patient and his wife at length and they agreed with starting apixaban 5mg  BID at this time. Stop ASA now that starting apixaban. Recommend follow-up in Cardiology clinic as  an out-patient and can consider TEE/DCCV at that time if he remains in Afib. Will likely need sleep study to assess for OSA as out-patient.  Gwyndolyn Kaufman, MD

## 2020-01-23 NOTE — Progress Notes (Signed)
[facility]  PROGRESS NOTE  Todd Harrison IWL:[id number] DOB: 19-Jun-1945 DOA: 01/22/2020 PCP: Maximiano Coss, NP   Brief HPI:   74 year old male with a history of lichen planus, psoriasis, hypothyroidism presents with cough and wheezing. Patient states symptoms started 5 weeks ago he was seen by pulmonologist, was diagnosed with left-sided pneumonia and acute asthma exacerbation for which she was treated with Medrol Dosepak along with Levaquin. Patient only had modest improvement and started having cough again with greenish sputum. He went to pulmonologist office and was found to be in A. fib with RVR with heart rate in 130s. Chest x-ray showed resolving left middle lobe pneumonia. Patient was diagnosed with lichen planus/psoriasis of the feet used to be on methotrexate which was stopped recently given lack of response. And he was started on mycophenolate and Soriatane which was added about 2 weeks ago.   Subjective   Patient seen and examined, continues to have wheezing. Heart rate is controlled, currently on Cardizem 15 mg/h.   Assessment/Plan:     1. Atrial fibrillation RVR- CHA2DS2VASc score is 1, patient started on aspirin. Echocardiogram has been ordered. TSH is 1.171. Continue Cardizem for rate control. 2. Acute persistent asthma-no previous history of COPD or asthma. No history of tobacco use. Patient recently had pneumonia which has been treated. Chest x-ray showed resolved pneumonia. Started on IV Solu-Medrol. Antibiotics are currently on hold. Procalcitonin less than 0.10. Continue scheduled ipratropium every 6 hours. Follow-up QuantiFERON-TB gold test results. Respiratory culture. 3. Hypothyroidism-TSH 1.171. Continue Synthroid 100 mcg daily. 4. Lichen planus/psoriasis-we will hold mycophenolate, patient is already on methylprednisolone as above. Continue Soriatane. 5. GERD-continue Protonix. Patient having intermittent problem with swallowing. Swallow evaluation  ordered. Follow GI as outpatient.      COVID-19 Labs  No results for input(s): DDIMER, FERRITIN, LDH, CRP in the last 72 hours.  Lab Results  Component Value Date   [place] 01/22/2020   [place] Not Detected 01/01/2020   [place] NEGATIVE 06/09/2019     Scheduled medications:   . acitretin  25 mg Oral Daily  . arformoterol  15 mcg Nebulization BID  . aspirin  81 mg Oral Daily  . enoxaparin (LOVENOX) injection  40 mg Subcutaneous Q24H  . famotidine  40 mg Oral Daily  . folic acid  1 mg Oral Daily  . hydrOXYzine  25 mg Oral Daily  . ipratropium  2 spray Each Nare TID  . ipratropium  0.5 mg Nebulization Q6H  . levothyroxine  100 mcg Oral Q24H  . methylPREDNISolone acetate  40 mg Intra-articular Once  . methylPREDNISolone (SOLU-MEDROL) injection  40 mg Intravenous Q12H  . montelukast  5 mg Oral QHS  . pantoprazole  40 mg Oral Daily  . triamcinolone cream   Topical BID         CBG: No results for input(s): GLUCAP in the last 168 hours.  SpO2: 90 %    CBC: Recent Labs  Lab 01/22/20 1330 01/23/20 0535  WBC 8.7 5.9  NEUTROABS 6.4  --   HGB 13.2 12.9*  HCT 41.3 40.6  MCV 91.8 91.4  PLT 231 174    Basic Metabolic Panel: Recent Labs  Lab 01/22/20 1330 01/23/20 0535  NA 137 132*  K 3.7 4.3  CL 106 106  CO2 20* 19*  GLUCOSE 105* 152*  BUN 10 12  CREATININE 1.26* 1.25*  CALCIUM 8.6* 8.8*  MG 2.1  --      Liver Function Tests: Recent Labs  Lab 01/22/20 1330  AST 15  ALT 18  ALKPHOS 44  BILITOT 0.9  PROT 6.0*  ALBUMIN 3.2*     Antibiotics: Anti-infectives (From admission, onward)   None       DVT prophylaxis: Lovenox  Code Status: Full code  Family Communication: No family at bedside    Status is: Inpatient  Dispo: The patient is from: Home              Anticipated d/c is to: Home              Anticipated d/c date is: 01/26/2020              Patient currently not medically stable for discharge  Barrier to  discharge-atrial fibrillation,     Consultants:    Procedures:     Objective   Vitals:   01/23/20 0623 01/23/20 0630 01/23/20 0645 01/23/20 0828  BP: 118/82 128/77 115/77 131/83  Pulse: (!) 105 (!) 120 (!) 125 (!) 117  Resp: 18 17 16 18   Temp:    98 F (36.7 C)  TempSrc:    Oral  SpO2: 93% (!) 88% 92% 90%  Weight:      Height:        Intake/Output Summary (Last 24 hours) at 01/23/2020 9767 Last data filed at 01/23/2020 0117 Gross per 24 hour  Intake 44.98 ml  Output 800 ml  Net -755.02 ml    09/15 1901 - 09/17 0700 In: 45 [I.V.:45] Out: 800 [Urine:800]  Filed Weights   01/22/20 1418  Weight: 135.6 kg    Physical Examination:    General: Appears in no acute distress  Cardiovascular: S1-S2, irregular, no murmur auscultated  Respiratory: Bilateral wheezing auscultated  Abdomen: Abdomen is soft, nontender, no organomegaly  Extremities: No edema in the lower extremities  Neurologic: Alert, oriented x3, no focal deficit noted    Data Reviewed:   Recent Results (from the past 240 hour(s))  SARS Coronavirus 2 by RT PCR (hospital order, performed in [facility] hospital lab) Nasopharyngeal Nasopharyngeal Swab     Status: None   Collection Time: 01/22/20  1:58 PM   Specimen: Nasopharyngeal Swab  Result Value Ref Range Status   SARS Coronavirus 2 NEGATIVE NEGATIVE Final    Comment: (NOTE) SARS-CoV-2 target nucleic acids are NOT DETECTED.  The SARS-CoV-2 RNA is generally detectable in upper and lower respiratory specimens during the acute phase of infection. The lowest concentration of SARS-CoV-2 viral copies this assay can detect is 250 copies / mL. A negative result does not preclude SARS-CoV-2 infection and should not be used as the sole basis for treatment or other patient management decisions.  A negative result may occur with improper specimen collection / handling, submission of specimen other than nasopharyngeal swab, presence of viral  mutation(s) within the areas targeted by this assay, and inadequate number of viral copies (<250 copies / mL). A negative result must be combined with clinical observations, patient history, and epidemiological information.  Fact Sheet for Patients:   StrictlyIdeas.no  Fact Sheet for Healthcare Providers: BankingDealers.co.za  This test is not yet approved or  cleared by the [place] FDA and has been authorized for detection and/or diagnosis of SARS-CoV-2 by FDA under an Emergency Use Authorization (EUA).  This EUA will remain in effect (meaning this test can be used) for the duration of the COVID-19 declaration under Section 564(b)(1) of the Act, 21 U.S.C. section 360bbb-3(b)(1), unless the authorization is terminated or revoked sooner.  Performed at [facility]  [facility], [address] [place], [place] [address]   Expectorated sputum assessment w rflx to resp cult     Status: None   Collection Time: 01/22/20  7:42 PM   Specimen: Expectorated Sputum  Result Value Ref Range Status   Specimen Description EXPECTORATED SPUTUM  Final   Special Requests NONE  Final   Sputum evaluation   Final    THIS SPECIMEN IS ACCEPTABLE FOR SPUTUM CULTURE Performed at [facility], [place] [address] [place], [place] [address]    Report Status 01/22/2020 FINAL  Final  Culture, respiratory     Status: None (Preliminary result)   Collection Time: 01/22/20  7:42 PM  Result Value Ref Range Status   Specimen Description EXPECTORATED SPUTUM  Final   Special Requests NONE Reflexed from [id number]  Final   Gram Stain   Final    MODERATE WBC PRESENT, PREDOMINANTLY PMN ABUNDANT GRAM POSITIVE COCCI FEW GRAM VARIABLE ROD Performed at [facility], [place] [address] [place], [place] [address]    Culture PENDING  Incomplete   Report Status PENDING  Incomplete  Respiratory Panel by PCR     Status: None   Collection Time: 01/22/20 10:29 PM   Specimen:  Nasopharyngeal Swab; Respiratory  Result Value Ref Range Status   Adenovirus NOT DETECTED NOT DETECTED Final   Coronavirus 229E NOT DETECTED NOT DETECTED Final    Comment: (NOTE) The Coronavirus on the Respiratory Panel, DOES NOT test for the novel  Coronavirus (2019 nCoV)    Coronavirus HKU1 NOT DETECTED NOT DETECTED Final   Coronavirus NL63 NOT DETECTED NOT DETECTED Final   Coronavirus OC43 NOT DETECTED NOT DETECTED Final   Metapneumovirus NOT DETECTED NOT DETECTED Final   Rhinovirus / Enterovirus NOT DETECTED NOT DETECTED Final   Influenza A NOT DETECTED NOT DETECTED Final   Influenza B NOT DETECTED NOT DETECTED Final   Parainfluenza Virus 1 NOT DETECTED NOT DETECTED Final   Parainfluenza Virus 2 NOT DETECTED NOT DETECTED Final   Parainfluenza Virus 3 NOT DETECTED NOT DETECTED Final   Parainfluenza Virus 4 NOT DETECTED NOT DETECTED Final   Respiratory Syncytial Virus NOT DETECTED NOT DETECTED Final   Bordetella pertussis NOT DETECTED NOT DETECTED Final   Chlamydophila pneumoniae NOT DETECTED NOT DETECTED Final   Mycoplasma pneumoniae NOT DETECTED NOT DETECTED Final    Comment: Performed at [facility], [place]. [address], [place], [place] [address]    No results for input(s): LIPASE, AMYLASE in the last 168 hours. No results for input(s): AMMONIA in the last 168 hours.  Cardiac Enzymes: No results for input(s): CKTOTAL, CKMB, CKMBINDEX, TROPONINI in the last 168 hours. BNP (last 3 results) Recent Labs    01/22/20 1322  BNP 184.6*    ProBNP (last 3 results) No results for input(s): PROBNP in the last 8760 hours.  Studies:  CT Chest Wo Contrast  Result Date: 01/22/2020 CLINICAL DATA:  Persistent cough EXAM: CT CHEST WITHOUT CONTRAST TECHNIQUE: Multidetector CT imaging of the chest was performed following the standard protocol without IV contrast. COMPARISON:  Radiograph same day FINDINGS: Cardiovascular: There are no significant vascular findings. Scattered vascular  calcifications are seen within the aorta. Coronary artery calcifications are seen. The heart size is normal. There is no pericardial thickening or effusion. Mediastinum/Nodes: There are no enlarged mediastinal, hilar or axillary lymph nodes. The thyroid gland, trachea and esophagus demonstrate no significant findings. Lungs/Pleura: Rounded patchy airspace opacity is seen within the posterior left lung base with mild  bronchial wall thickening. There is small tree-in-bud opacities also noted within the right lung base. Upper abdomen: The visualized portion of the upper abdomen is unremarkable. Musculoskeletal/Chest wall: There is no chest wall mass or suspicious osseous finding. No acute osseous abnormality IMPRESSION: Patchy airspace opacity with tree-in-bud opacities at the posterior left lung base which could be due to resolving infectious etiology from August 2021 or inflammatory process. Electronically Signed   By: Prudencio Pair M.D.   On: 01/22/2020 17:56   DG Chest Portable 1 View  Result Date: 01/22/2020 CLINICAL DATA:  Cough for weeks EXAM: PORTABLE CHEST 1 VIEW COMPARISON:  01/01/2020 FINDINGS: Normal heart size, mediastinal contours, and pulmonary vascularity. Lungs clear. Resolution of LEFT mid lung infiltrates since previous exam. No pleural effusion or pneumothorax. Osseous structures unremarkable. IMPRESSION: Resolution of previously identified pneumonia in LEFT mid lung. No new abnormalities. Electronically Signed   By: Lavonia Dana M.D.   On: 01/22/2020 13:50       [place]   [facility] If 7PM-7AM, please contact night-coverage at [email or web address], Office  [phone]   01/23/2020, 8:33 AM  LOS: 1 day

## 2020-01-23 NOTE — Progress Notes (Signed)
[facility]  S: Feels a little better, still has dry cough, still has a feeling of having mucus in his chest and throat.  Remains in Atrial fib. Modified barium  Past Medical History:  Diagnosis Date  . Lichen planus   . Methotrexate, long term, current use   . Psoriasis   . Thyroid disease    Vitals:   01/23/20 1312 01/23/20 1500 01/23/20 1541 01/23/20 1630  BP: (!) 172/116  118/69 123/88  Pulse: 65  (!) 108 (!) 106  Resp: (!) 30  (!) 28 20  Temp: 98.1 F (36.7 C)  97.8 F (36.6 C) 98.5 F (36.9 C)  TempSrc: Oral  Oral Oral  SpO2: 94% 95% 94% 96%  Weight:    135.6 kg  Height:    6' (1.829 m)   RA  General:  Resting comfortably in bed HENT: NCAT OP clear PULM: CTA B, normal effort CV: Irreg irreg, no mgr GI: BS+, soft, nontender MSK: normal bulk and tone Neuro: awake, alert, no distress, MAEW  CT chest images personally reviewed> thickened airways in dependent fashion, patchy tree-in-bud, ground glass in bases  9/17 Modified barium swallow: These deficits resulted in silent aspiration of thin liquid by straw, audible aspiration of thin liquid wash during pill simulation, and intermittent penetration of thin liquid, which was largely transient. There was no penetration or aspiraiton of solid consistencies.Penetration occured before the swallow 2/2 premature spillage, with aspiration occuring with glottal relaxation  Impression: Tracheobronchitis (less likely pneumonia) in an immunocompromised patient due to aspiration:  -start doxycycline x 5 days -add mucinex  Asthma, not in exacerbation: -xopenex, ipratropium > make PRN -add dulera in lieu of home symbicort  Aspiration:  -speech recommendations: Recommend intermittent throat clear to reduce risk of aspiration of any remaining liquid in laryngeal vetibule.   Lingering cough: if still present after completing antibiotic treatment, would focus on voice rest, warm beverages/hard candies to soothe throat, etc.  F/u in  pulmonary office in 4-6 weeks  Call us if he worsens, will sign off for now  Roselie Awkward, MD [facility] Pager: [phone] Cell: [phone] If no response, call [phone]

## 2020-01-23 NOTE — Evaluation (Signed)
Clinical/Bedside Swallow Evaluation Patient Details  Name: Todd Harrison MRN: [id number] Date of Birth: [birth year]  Today's Date: 01/23/2020 Time: SLP Start Time (ACUTE ONLY): 6010 SLP Stop Time (ACUTE ONLY): 0938 SLP Time Calculation (min) (ACUTE ONLY): 14 min  Past Medical History:  Past Medical History:  Diagnosis Date  . Lichen planus   . Methotrexate, long term, current use   . Psoriasis   . Thyroid disease    Past Surgical History:  Past Surgical History:  Procedure Laterality Date  . CATARACT EXTRACTION Left    20110   HPI:  74 year old male with a history of lichen planus, psoriasis, hypothyroidism presents with cough and wheezing. Patient states symptoms started 5 weeks ago he was seen by pulmonologist, was diagnosed with left-sided pneumonia and acute asthma exacerbation for which she was treated with Medrol Dosepak along with Levaquin. Patient only had modest improvement and started having cough again with greenish sputum. Found to be in A fib during pulmonology follow up appointment.  Chest CT 9/17 revealed resolving L pneumonia   Assessment / Plan / Recommendation Clinical Impression  Pt presents with grossly normal swallow function as assessed clinically.  There were no clinical s/s of aspiration with any consistencies trialed and pt exhibited good oral clearance of solids.  However, given pt's hx of GERD and persistent pna, there remains a concern for prandial aspiration.  Recommend MBSS to further assess pharyngeal swallow function.  Pt may continue regular diet at present. SLP Visit Diagnosis: Dysphagia, unspecified (R13.10)    Aspiration Risk  Mild aspiration risk    Diet Recommendation Regular;Thin liquid   Liquid Administration via: Cup;Straw Supervision: Patient able to self feed Compensations: Small sips/bites Postural Changes: Seated upright at 90 degrees    Other  Recommendations Oral Care Recommendations: Oral care BID   Follow up Recommendations         Frequency and Duration   TBD         Prognosis   TBD     Swallow Study   General Date of Onset: 01/22/20 HPI: 74 year old male with a history of lichen planus, psoriasis, hypothyroidism presents with cough and wheezing. Patient states symptoms started 5 weeks ago he was seen by pulmonologist, was diagnosed with left-sided pneumonia and acute asthma exacerbation for which she was treated with Medrol Dosepak along with Levaquin. Patient only had modest improvement and started having cough again with greenish sputum. Found to be in A fib during pulmonology follow up appointment.  Chest CT 9/17 revealed resolving L pneumonia Type of Study: Bedside Swallow Evaluation Previous Swallow Assessment: None Diet Prior to this Study: Regular Temperature Spikes Noted: No Respiratory Status: Nasal cannula History of Recent Intubation: No Behavior/Cognition: Alert;Cooperative;Pleasant mood Oral Cavity Assessment: Within Functional Limits Oral Care Completed by SLP: No Oral Cavity - Dentition: Adequate natural dentition Vision: Functional for self-feeding Self-Feeding Abilities: Able to feed self Patient Positioning: Upright in bed Baseline Vocal Quality: Hoarse Volitional Cough: Strong Volitional Swallow: Able to elicit    Oral/Motor/Sensory Function Overall Oral Motor/Sensory Function: Within functional limits Facial ROM: Within Functional Limits Facial Symmetry: Within Functional Limits Lingual ROM: Within Functional Limits Lingual Symmetry: Within Functional Limits Lingual Strength: Within Functional Limits Velum: Within Functional Limits Mandible: Within Functional Limits   Ice Chips Ice chips: Not tested   Thin Liquid Thin Liquid: Within functional limits Presentation: Straw    Nectar Thick  Nectar Thick Liquid: Not tested  Honey Thick Honey Thick Liquid: Not tested   Puree Puree: Within functional  limits   Solid     Solid: Within functional limits Presentation: [place], [place], [place] Office: [phone] 01/23/2020,10:09 AM

## 2020-01-23 NOTE — Progress Notes (Signed)
Modified Barium Swallow Progress Note  Patient Details  Name: Todd Harrison MRN: [id number] Date of Birth: 11-Jan-1946  Today's Date: 01/23/2020  Modified Barium Swallow completed.  Full report located under Chart Review in the Imaging Section.  Brief recommendations include the following:  Clinical Impression  Pt presents with mild oropharyngeal dysphagia c/b delayed swallow initiation, incomplete laryngeal elevation, reduced epiglottic inversion, decreased laryngeal closure and diminished sensation.  These deficits resulted in silent aspiration of thin liquid by straw, audible aspiration of thin liquid wash during pill simulation, and intermittent penetration of thin liquid, which was largely transient. There was no penetration or aspiraiton of solid consistencies.  Penetration occured before the swallow 2/2 premature spillage, with aspiration occuring with glottal relaxation. This seems to occur when epiglottis does not fully deflect, and was not seen on all swallows.  Pt reports having recently seen ENT with [facility] and was scoped at that time revealing an "enlarged larynx."  It is entirely possible that pharyngeal swelling is impeding epiglottic inversion.  There may be cervical osteophytes present as well impeding epiglottic inversion.  There was a greater volume of aspiration with pill simulation than with straw sips.  Cup sips prevented aspiration.  Trace, largely transient penetration seen inconsistently with cup sips.  Recommend intermittent throat clear to reduce risk of aspiration of any remaining liquid in laryngeal vetibule.   Recommend regular texture diet with thin liquids BY CUP with aspiration precautions as described above.   Swallow Evaluation Recommendations       SLP Diet Recommendations: Regular solids;Thin liquid   Liquid Administration via: No straw;Cup   Medication Administration: Whole meds with puree   Supervision: Patient able to self  feed   Compensations: Slow rate;Small sips/bites   Postural Changes: Seated upright at 90 degrees;Remain semi-upright after after feeds/meals (Comment)   Oral Care Recommendations: Oral care BID        Celedonio Savage, [place], [place] Office: [phone] 01/23/2020,11:21 AM

## 2020-01-23 NOTE — Progress Notes (Signed)
  Speech Language Pathology Treatment: Dysphagia  Patient Details Name: Todd Harrison MRN: [id number] DOB: 1946/03/28 Today's Date: 01/23/2020 Time: 0375-4360 SLP Time Calculation (min) (ACUTE ONLY): 14 min  Assessment / Plan / Recommendation Clinical Impression  Saw pt in room for swallowing therapy.  Introduced swallowing exercises to pt.  Pt demonstrated ability to execute effortful swallow and Mendelssohn maneuver with excellent accuracy and effort.  Reviewed results of MBSS in room with pt able to view imaging.  Discussed anatomical factors that may be impacting swallow.  Pt verbalized understanding.  Pt left with exercise handout for independent practice.    HPI HPI: 74 year old male with a history of lichen planus, psoriasis, hypothyroidism presents with cough and wheezing. Patient states symptoms started 5 weeks ago he was seen by pulmonologist, was diagnosed with left-sided pneumonia and acute asthma exacerbation for which she was treated with Medrol Dosepak along with Levaquin. Patient only had modest improvement and started having cough again with greenish sputum. Found to be in A fib during pulmonology follow up appointment.  Chest CT 9/17 revealed resolving L pneumonia      SLP Plan  Continue with current plan of care       Recommendations  Diet recommendations: Regular;Thin liquid Liquids provided via: Cup;No straw Medication Administration: Whole meds with puree Supervision: Patient able to self feed Compensations: Slow rate;Small sips/bites Postural Changes and/or Swallow Maneuvers: Seated upright 90 degrees;Upright 30-60 min after meal (for reflux)                Oral Care Recommendations: Oral care BID Follow up Recommendations: Outpatient SLP SLP Visit Diagnosis: Dysphagia, oropharyngeal phase (R13.12) Plan: Continue with current plan of care       GO                Todd Harrison 01/23/2020, 12:56 PM

## 2020-01-23 NOTE — Progress Notes (Signed)
  Echocardiogram 2D Echocardiogram has been performed.  Todd Harrison 01/23/2020, 9:33 AM

## 2020-01-24 LAB — BASIC METABOLIC PANEL
Anion gap: 13 (ref 5–15)
BUN: 18 mg/dL (ref 8–23)
CO2: 17 mmol/L — ABNORMAL LOW (ref 22–32)
Calcium: 9.4 mg/dL (ref 8.9–10.3)
Chloride: 106 mmol/L (ref 98–111)
Creatinine, Ser: 1.39 mg/dL — ABNORMAL HIGH (ref 0.61–1.24)
GFR calc Af Amer: 59 mL/min — ABNORMAL LOW (ref >60)
GFR calc non Af Amer: 51 mL/min — ABNORMAL LOW (ref >60)
Glucose, Bld: 126 mg/dL — ABNORMAL HIGH (ref 70–99)
Potassium: 4.8 mmol/L (ref 3.5–5.1)
Sodium: 136 mmol/L (ref 135–145)

## 2020-01-24 LAB — PROCALCITONIN: Procalcitonin: <0.1 ng/mL

## 2020-01-24 MED ORDER — DILTIAZEM HCL 60 MG PO TABS
60.0000 mg | ORAL_TABLET | Freq: Four times a day (QID) | ORAL | Status: DC
  Administered 2020-01-24 – 2020-01-25 (×5): 60 mg via ORAL
  Filled 2020-01-24 (×5): qty 1

## 2020-01-24 MED ORDER — INFLUENZA VAC A&B SA ADJ QUAD 0.5 ML IM PRSY
0.5000 mL | PREFILLED_SYRINGE | INTRAMUSCULAR | Status: AC
  Administered 2020-01-25: 0.5 mL via INTRAMUSCULAR
  Filled 2020-01-24: qty 0.5

## 2020-01-24 MED ORDER — PNEUMOCOCCAL VAC POLYVALENT 25 MCG/0.5ML IJ INJ
0.5000 mL | INJECTION | INTRAMUSCULAR | Status: AC
  Administered 2020-01-25: 0.5 mL via INTRAMUSCULAR
  Filled 2020-01-24: qty 0.5

## 2020-01-24 NOTE — Progress Notes (Signed)
[facility]  PROGRESS NOTE  Todd Harrison NUU:[id number] DOB: 03/29/1946 DOA: 01/22/2020 PCP: Maximiano Coss, NP   Brief HPI:   74 year old male with a history of lichen planus, psoriasis, hypothyroidism presents with cough and wheezing. Patient states symptoms started 5 weeks ago he was seen by pulmonologist, was diagnosed with left-sided pneumonia and acute asthma exacerbation for which she was treated with Medrol Dosepak along with Levaquin. Patient only had modest improvement and started having cough again with greenish sputum. He went to pulmonologist office and was found to be in A. fib with RVR with heart rate in 130s. Chest x-ray showed resolving left middle lobe pneumonia. Patient was diagnosed with lichen planus/psoriasis of the feet used to be on methotrexate which was stopped recently given lack of response. And he was started on mycophenolate and Soriatane which was added about 2 weeks ago.   Subjective   Patient seen and examined, still requiring Cardizem 15 mg/h.  Cardiology has seen the patient and put him on Eliquis with plan for for outpatient DCCV after 3 weeks of anticoagulation.   Assessment/Plan:     1. Atrial fibrillation RVR- CHA2DS2VASc score is 1, patient started on aspirin. Echocardiogram showed mild to moderate MR, EF 55 to 60% . TSH is 1.171.  Patient was started on Cardizem gtt. for rate control.  Patient started on oral Cardizem 60 mg 4 times daily and wean off diltiazem gtt.  Cardiology is planning to do outpatient DCCV after 3 weeks of anticoagulation.  Patient started on Eliquis. 2. Acute persistent asthma-no previous history of COPD or asthma. No history of tobacco use. Patient recently had pneumonia which has been treated. Chest x-ray showed resolved pneumonia. Started on IV Solu-Medrol. Antibiotics are currently on hold. Procalcitonin less than 0.10. Continue scheduled ipratropium every 6 hours. Follow-up QuantiFERON-TB gold test results.  Respiratory panel is negative. 3. Hypothyroidism-TSH 1.171. Continue Synthroid 100 mcg daily. 4. Lichen planus/psoriasis-we will hold mycophenolate, patient is already on methylprednisolone as above. Continue Soriatane. 5. GERD-continue Protonix. Patient having intermittent problem with swallowing. Swallow evaluation done.  Patient started on regular diet with thin liquids.      COVID-19 Labs  No results for input(s): DDIMER, FERRITIN, LDH, CRP in the last 72 hours.  Lab Results  Component Value Date   [place] NEGATIVE 01/22/2020   [place] Not Detected 01/01/2020   [place] NEGATIVE 06/09/2019     Scheduled medications:   . apixaban  5 mg Oral BID  . diltiazem  60 mg Oral Q6H  . doxycycline  100 mg Oral Q12H  . folic acid  1 mg Oral Daily  . guaiFENesin  1,200 mg Oral BID  . hydrOXYzine  25 mg Oral Daily  . [START ON 01/25/2020] influenza vaccine adjuvanted  0.5 mL Intramuscular Tomorrow-1000  . ipratropium  2 spray Each Nare TID  . levothyroxine  100 mcg Oral Q24H  . methylPREDNISolone (SOLU-MEDROL) injection  40 mg Intravenous Q12H  . mometasone-formoterol  2 puff Inhalation BID  . montelukast  5 mg Oral QHS  . pantoprazole  40 mg Oral Daily  . [START ON 01/25/2020] pneumococcal 23 valent vaccine  0.5 mL Intramuscular Tomorrow-1000  . triamcinolone cream   Topical BID         CBG: No results for input(s): GLUCAP in the last 168 hours.  SpO2: 98 % O2 Flow Rate (L/min): 2 L/min    CBC: Recent Labs  Lab 01/22/20 1330 01/23/20 0535  WBC 8.7 5.9  NEUTROABS 6.4  --  HGB 13.2 12.9*  HCT 41.3 40.6  MCV 91.8 91.4  PLT 231 093    Basic Metabolic Panel: Recent Labs  Lab 01/22/20 1330 01/23/20 0535  NA 137 132*  K 3.7 4.3  CL 106 106  CO2 20* 19*  GLUCOSE 105* 152*  BUN 10 12  CREATININE 1.26* 1.25*  CALCIUM 8.6* 8.8*  MG 2.1  --      Liver Function Tests: Recent Labs  Lab 01/22/20 1330  AST 15  ALT 18  ALKPHOS 44  BILITOT 0.9   PROT 6.0*  ALBUMIN 3.2*     Antibiotics: Anti-infectives (From admission, onward)   Start     Dose/Rate Route Frequency Ordered Stop   01/23/20 2200  doxycycline (VIBRA-TABS) tablet 100 mg        100 mg Oral Every 12 hours 01/23/20 1712         DVT prophylaxis: Lovenox  Code Status: Full code  Family Communication: No family at bedside    Status is: Inpatient  Dispo: The patient is from: Home              Anticipated d/c is to: Home              Anticipated d/c date is: 01/25/2020              Patient currently not medically stable for discharge  Barrier to discharge-atrial fibrillation,     Consultants:    Procedures:     Objective   Vitals:   01/24/20 0111 01/24/20 0419 01/24/20 0900 01/24/20 0922  BP:  (!) 138/98 (!) 130/118   Pulse: (!) 107  (!) 102   Resp:  17 20   Temp:  98.1 F (36.7 C) 98.4 F (36.9 C)   TempSrc:  Oral Oral   SpO2:   98% 98%  Weight:      Height:        Intake/Output Summary (Last 24 hours) at 01/24/2020 1150 Last data filed at 01/24/2020 1100 Gross per 24 hour  Intake 1286.75 ml  Output 2350 ml  Net -1063.25 ml    09/16 1901 - 09/18 0700 In: 806.8 [P.O.:360; I.V.:446.8] Out: 2550 [Urine:2550]  Filed Weights   01/22/20 1418 01/23/20 1630 01/24/20 0015  Weight: 135.6 kg 135.6 kg (!) 137 kg    Physical Examination:   General-appears in no acute distress Heart-S1-S2, irregular, no murmur auscultated Lungs-clear to auscultation bilaterally, no wheezing or crackles auscultated Abdomen-soft, nontender, no organomegaly Extremities-no edema in the lower extremities Neuro-alert, oriented x3, no focal deficit noted   Data Reviewed:   Recent Results (from the past 240 hour(s))  SARS Coronavirus 2 by RT PCR (hospital order, performed in [place] hospital lab) Nasopharyngeal Nasopharyngeal Swab     Status: None   Collection Time: 01/22/20  1:58 PM   Specimen: Nasopharyngeal Swab  Result Value Ref Range Status    SARS Coronavirus 2 NEGATIVE NEGATIVE Final    Comment: (NOTE) SARS-CoV-2 target nucleic acids are NOT DETECTED.  The SARS-CoV-2 RNA is generally detectable in upper and lower respiratory specimens during the acute phase of infection. The lowest concentration of SARS-CoV-2 viral copies this assay can detect is 250 copies / mL. A negative result does not preclude SARS-CoV-2 infection and should not be used as the sole basis for treatment or other patient management decisions.  A negative result may occur with improper specimen collection / handling, submission of specimen other than nasopharyngeal swab, presence of viral mutation(s) within the areas  targeted by this assay, and inadequate number of viral copies (<250 copies / mL). A negative result must be combined with clinical observations, patient history, and epidemiological information.  Fact Sheet for Patients:   StrictlyIdeas.no  Fact Sheet for Healthcare Providers: BankingDealers.co.za  This test is not yet approved or  cleared by the [place] FDA and has been authorized for detection and/or diagnosis of SARS-CoV-2 by FDA under an Emergency Use Authorization (EUA).  This EUA will remain in effect (meaning this test can be used) for the duration of the COVID-19 declaration under Section 564(b)(1) of the Act, 21 U.S.C. section 360bbb-3(b)(1), unless the authorization is terminated or revoked sooner.  Performed at [facility], [place] [address], [place], [place] [address]   Culture, blood (routine x 2)     Status: None (Preliminary result)   Collection Time: 01/22/20  1:59 PM   Specimen: BLOOD LEFT HAND  Result Value Ref Range Status   Specimen Description BLOOD LEFT HAND  Final   Special Requests   Final    BOTTLES DRAWN AEROBIC AND ANAEROBIC Blood Culture adequate volume   Culture   Final    NO GROWTH < 24 HOURS Performed at [facility], [place] [address],  [place], [place] [address]    Report Status PENDING  Incomplete  Culture, blood (routine x 2)     Status: None (Preliminary result)   Collection Time: 01/22/20  1:59 PM   Specimen: BLOOD  Result Value Ref Range Status   Specimen Description BLOOD LEFT ANTECUBITAL  Final   Special Requests   Final    BOTTLES DRAWN AEROBIC AND ANAEROBIC Blood Culture adequate volume   Culture   Final    NO GROWTH < 24 HOURS Performed at [facility], [place] [address], [place], [place] [address]    Report Status PENDING  Incomplete  Expectorated sputum assessment w rflx to resp cult     Status: None   Collection Time: 01/22/20  7:42 PM   Specimen: Expectorated Sputum  Result Value Ref Range Status   Specimen Description EXPECTORATED SPUTUM  Final   Special Requests NONE  Final   Sputum evaluation   Final    THIS SPECIMEN IS ACCEPTABLE FOR SPUTUM CULTURE Performed at [facility], [place] [address] [place], [place] [address]    Report Status 01/22/2020 FINAL  Final  Culture, respiratory     Status: None (Preliminary result)   Collection Time: 01/22/20  7:42 PM  Result Value Ref Range Status   Specimen Description EXPECTORATED SPUTUM  Final   Special Requests NONE Reflexed from [id number]  Final   Gram Stain   Final    MODERATE WBC PRESENT, PREDOMINANTLY PMN ABUNDANT GRAM POSITIVE COCCI FEW GRAM VARIABLE ROD    Culture   Final    CULTURE REINCUBATED FOR BETTER GROWTH Performed at [facility], [place] [address] [place], [place] [address]    Report Status PENDING  Incomplete  Respiratory Panel by PCR     Status: None   Collection Time: 01/22/20 10:29 PM   Specimen: Nasopharyngeal Swab; Respiratory  Result Value Ref Range Status   Adenovirus NOT DETECTED NOT DETECTED Final   Coronavirus 229E NOT DETECTED NOT DETECTED Final    Comment: (NOTE) The Coronavirus on the Respiratory Panel, DOES NOT test for the novel  Coronavirus (2019 nCoV)    Coronavirus HKU1 NOT DETECTED NOT DETECTED Final    Coronavirus NL63 NOT DETECTED NOT DETECTED Final  Coronavirus OC43 NOT DETECTED NOT DETECTED Final   Metapneumovirus NOT DETECTED NOT DETECTED Final   Rhinovirus / Enterovirus NOT DETECTED NOT DETECTED Final   Influenza A NOT DETECTED NOT DETECTED Final   Influenza B NOT DETECTED NOT DETECTED Final   Parainfluenza Virus 1 NOT DETECTED NOT DETECTED Final   Parainfluenza Virus 2 NOT DETECTED NOT DETECTED Final   Parainfluenza Virus 3 NOT DETECTED NOT DETECTED Final   Parainfluenza Virus 4 NOT DETECTED NOT DETECTED Final   Respiratory Syncytial Virus NOT DETECTED NOT DETECTED Final   Bordetella pertussis NOT DETECTED NOT DETECTED Final   Chlamydophila pneumoniae NOT DETECTED NOT DETECTED Final   Mycoplasma pneumoniae NOT DETECTED NOT DETECTED Final    Comment: Performed at [facility], [place] [address] [place], [place] [address]    No results for input(s): LIPASE, AMYLASE in the last 168 hours. No results for input(s): AMMONIA in the last 168 hours.  Cardiac Enzymes: No results for input(s): CKTOTAL, CKMB, CKMBINDEX, TROPONINI in the last 168 hours. BNP (last 3 results) Recent Labs    01/22/20 1322  BNP 184.6*    ProBNP (last 3 results) No results for input(s): PROBNP in the last 8760 hours.  Studies:  CT Chest Wo Contrast  Result Date: 01/22/2020 CLINICAL DATA:  Persistent cough EXAM: CT CHEST WITHOUT CONTRAST TECHNIQUE: Multidetector CT imaging of the chest was performed following the standard protocol without IV contrast. COMPARISON:  Radiograph same day FINDINGS: Cardiovascular: There are no significant vascular findings. Scattered vascular calcifications are seen within the aorta. Coronary artery calcifications are seen. The heart size is normal. There is no pericardial thickening or effusion. Mediastinum/Nodes: There are no enlarged mediastinal, hilar or axillary lymph nodes. The thyroid gland, trachea and esophagus demonstrate no significant findings. Lungs/Pleura:  Rounded patchy airspace opacity is seen within the posterior left lung base with mild bronchial wall thickening. There is small tree-in-bud opacities also noted within the right lung base. Upper abdomen: The visualized portion of the upper abdomen is unremarkable. Musculoskeletal/Chest wall: There is no chest wall mass or suspicious osseous finding. No acute osseous abnormality IMPRESSION: Patchy airspace opacity with tree-in-bud opacities at the posterior left lung base which could be due to resolving infectious etiology from August 2021 or inflammatory process. Electronically Signed   By: Prudencio Pair M.D.   On: 01/22/2020 17:56   DG Chest Portable 1 View  Result Date: 01/22/2020 CLINICAL DATA:  Cough for weeks EXAM: PORTABLE CHEST 1 VIEW COMPARISON:  01/01/2020 FINDINGS: Normal heart size, mediastinal contours, and pulmonary vascularity. Lungs clear. Resolution of LEFT mid lung infiltrates since previous exam. No pleural effusion or pneumothorax. Osseous structures unremarkable. IMPRESSION: Resolution of previously identified pneumonia in LEFT mid lung. No new abnormalities. Electronically Signed   By: Lavonia Dana M.D.   On: 01/22/2020 13:50   ECHOCARDIOGRAM COMPLETE  Result Date: 01/23/2020    ECHOCARDIOGRAM REPORT   Patient Name:   Todd Harrison Date of Exam: 01/23/2020 Medical Rec #:  [id number]       Height:       72.0 in Accession #:    [id number]      Weight:       299.0 lb Date of Birth:  [birth year]      BSA:          2.528 m Patient Age:    68 years        BP:           115/77 mmHg Patient Gender:  M               HR:           110 bpm. Exam Location:  Inpatient Procedure: 2D Echo, Cardiac Doppler, Color Doppler and Intracardiac            Opacification Agent Indications:    R06.02 SOB; I48.91* Unspeicified atrial fibrillation  History:        Patient has no prior history of Echocardiogram examinations.                 Abnormal ECG, Arrythmias:Atrial Fibrillation;                  Signs/Symptoms:Dyspnea. Pneumonia.  Sonographer:    Roseanna Rainbow RDCS Referring Phys: [id number] [place] Comments: Technically difficult study due to poor echo windows. Image acquisition challenging due to respiratory motion. Imaging difficult due to respiratory difficulty. IMPRESSIONS  1. Left ventricular ejection fraction, by estimation, is 55 to 60%. The left ventricle has normal function. The left ventricle has no regional wall motion abnormalities. There is mild concentric left ventricular hypertrophy. Left ventricular diastolic function could not be evaluated.  2. Right ventricular systolic function is normal. The right ventricular size is normal. Tricuspid regurgitation signal is inadequate for assessing PA pressure.  3. The mitral valve is grossly normal. Mild to moderate mitral valve regurgitation. No evidence of mitral stenosis.  4. The aortic valve is tricuspid. There is moderate calcification of the aortic valve. There is moderate thickening of the aortic valve. Aortic valve regurgitation is not visualized. Mild to moderate aortic valve sclerosis/calcification is present, without any evidence of aortic stenosis.  5. Aortic dilatation noted. There is mild dilatation of the aortic root, measuring 42 mm.  6. The inferior vena cava is dilated in size with >50% respiratory variability, suggesting right atrial pressure of 8 mmHg. FINDINGS  Left Ventricle: Left ventricular ejection fraction, by estimation, is 55 to 60%. The left ventricle has normal function. The left ventricle has no regional wall motion abnormalities. Definity contrast agent was given IV to delineate the left ventricular  endocardial borders. The left ventricular internal cavity size was normal in size. There is mild concentric left ventricular hypertrophy. Left ventricular diastolic function could not be evaluated due to atrial fibrillation. Left ventricular diastolic function could not be evaluated. Right Ventricle: The right  ventricular size is normal. No increase in right ventricular wall thickness. Right ventricular systolic function is normal. Tricuspid regurgitation signal is inadequate for assessing PA pressure. Left Atrium: Left atrial size was normal in size. Right Atrium: Right atrial size was normal in size. Pericardium: Trivial pericardial effusion is present. Presence of pericardial fat pad. Mitral Valve: The mitral valve is grossly normal. Mild to moderate mitral valve regurgitation. No evidence of mitral valve stenosis. Tricuspid Valve: The tricuspid valve is grossly normal. Tricuspid valve regurgitation is trivial. No evidence of tricuspid stenosis. Aortic Valve: The aortic valve is tricuspid. There is moderate calcification of the aortic valve. There is moderate thickening of the aortic valve. Aortic valve regurgitation is not visualized. Mild to moderate aortic valve sclerosis/calcification is present, without any evidence of aortic stenosis. Aortic valve mean gradient measures 7.0 mmHg. Aortic valve peak gradient measures 13.5 mmHg. Aortic valve area, by VTI measures 3.68 cm. Pulmonic Valve: The pulmonic valve was grossly normal. Pulmonic valve regurgitation is not visualized. No evidence of pulmonic stenosis. Aorta: Aortic dilatation noted. There is mild dilatation of the aortic root, measuring 42 mm.  Venous: The inferior vena cava is dilated in size with greater than 50% respiratory variability, suggesting right atrial pressure of 8 mmHg. IAS/Shunts: The atrial septum is grossly normal.  LEFT VENTRICLE PLAX 2D LVIDd:         4.60 cm LVIDs:         4.10 cm LV PW:         1.20 cm LV IVS:        1.20 cm LVOT diam:     2.80 cm LV SV:         120 LV SV Index:   48 LVOT Area:     6.16 cm  LV Volumes (MOD) LV vol d, MOD A2C: 62.2 ml LV vol d, MOD A4C: 115.5 ml LV vol s, MOD A2C: 29.0 ml LV vol s, MOD A4C: 64.6 ml LV SV MOD A2C:     33.2 ml LV SV MOD A4C:     115.5 ml LV SV MOD BP:      43.8 ml RIGHT VENTRICLE         IVC  TAPSE (M-mode): 1.6 cm  IVC diam: 2.80 cm LEFT ATRIUM             Index       RIGHT ATRIUM           Index LA diam:        4.40 cm 1.74 cm/m  RA Area:     20.80 cm LA Vol (A2C):   86.5 ml 34.22 ml/m RA Volume:   49.40 ml  19.54 ml/m LA Vol (A4C):   73.5 ml 29.08 ml/m LA Biplane Vol: 81.5 ml 32.24 ml/m  AORTIC VALVE AV Area (Vmax):    3.61 cm AV Area (Vmean):   4.06 cm AV Area (VTI):     3.68 cm AV Vmax:           184.00 cm/s AV Vmean:          119.000 cm/s AV VTI:            0.326 m AV Peak Grad:      13.5 mmHg AV Mean Grad:      7.0 mmHg LVOT Vmax:         108.00 cm/s LVOT Vmean:        78.500 cm/s LVOT VTI:          0.195 m LVOT/AV VTI ratio: 0.60  AORTA Ao Root diam: 4.20 cm Ao Asc diam:  4.10 cm MR PISA:        0.25 cm MR PISA Radius: 0.20 cm  SHUNTS                          Systemic VTI:  0.20 m                          Systemic Diam: 2.80 cm Eleonore Chiquito MD Electronically signed by Eleonore Chiquito MD Signature Date/Time: 01/23/2020/11:06:07 AM    Final        Oswald Hillock   [facility] If 7PM-7AM, please contact night-coverage at [email or web address], Office  [phone]   01/24/2020, 11:50 AM  LOS: 2 days

## 2020-01-24 NOTE — Progress Notes (Signed)
Initial Nutrition Assessment  RD working remotely.  DOCUMENTATION CODES:   Morbid obesity  INTERVENTION:  - family to bring in items patient enjoys.  NUTRITION DIAGNOSIS:   Increased nutrient needs related to acute illness as evidenced by estimated needs.  GOAL:   Patient will meet greater than or equal to 90% of their needs  MONITOR:   PO intake, Labs, Weight trends  REASON FOR ASSESSMENT:   Consult Assessment of nutrition requirement/status  ASSESSMENT:   74 year old male with medical history of lichen planus, psoriasis, GERD, and hypothyroidism. He presented to the ED with cough and wheezing x5 weeks. He was seen outpatient by Pulmonologist and was dx with L-sided PNA and acute asthma exacerbation.  Patient ate 100% of dinner last night (642 kcal, 38 grams protein) and 100% of breakfast and lunch today (total of 1408 kcal, 39 grams protein).   Patient did not answer phone. Able to talk with patient's daughter, who is also a RD with [employer]. Patient has had a persistent cough which would often lead to "coughing fits". He was evaluated outpatient and noted to have an enlarged larynx or swelling around this area. He would experience coughing with PO intakes but it would happen at random and did not occur with any particular items. No chewing difficulties at baseline.   SLP saw patient yesterday and completed MBS. Regular consistency foods and thin liquids were recommended despite some aspiration on thin liquids.   Patient was previously on methotrexate which he did not take every day. When he did take it, he would experience nausea for an unknown amount of time afterward. Medication was changed 2 weeks ago and it is unknown if this had any side effects for him, to include taste changes, nausea, or appetite suppression.   It is felt that patient was often weak or tired due to persistent coughing. He was not eating 3 meals/day as he previously had. Since admission, he has  been eating better and family has been bringing in items from a juice and smoothie shop and from [employer].   Daughter reports that due to above listed items, patient lost ~20 lb in the past 1 month. Weight today is 302 lb and weight on 7/16 was 319 lb. This indicates 17 lb weight loss (5.3% body weight) in the past 2 months; not significant for time frame.    Labs reviewed; creatinine: 1.39 mg/dl, GFR: 51 ml/min. Medications reviewed; 1 mg folvite/day, 100 mcg oral synthroid/day, 40 mg solu-medrol BID.     NUTRITION - FOCUSED PHYSICAL EXAM:  unable to complete at this time.   Diet Order:   Diet Order            Diet regular Room service appropriate? Yes; Fluid consistency: Thin  Diet effective now                 EDUCATION NEEDS:   No education needs have been identified at this time  Skin:  Skin Assessment: Reviewed RN Assessment  Last BM:  PTA/unknown  Height:   Ht Readings from Last 1 Encounters:  01/23/20 6' (1.829 m)    Weight:   Wt Readings from Last 1 Encounters:  01/24/20 (!) 137 kg     Estimated Nutritional Needs:  Kcal:  2100-2300 kcal Protein:  110-120 grams Fluid:  >/= 2.2 L/day      Jarome Matin, MS, RD, LDN, CNSC Inpatient Clinical Dietitian RD pager # available in AMION  After hours/weekend pager # available in [facility]

## 2020-01-24 NOTE — Evaluation (Signed)
Occupational Therapy Evaluation Patient Details Name: Todd Harrison MRN: [id number] DOB: 01/04/1946 Today's Date: 01/24/2020    History of Present Illness 74 yo male with onset of SOB after recent bout of PNA was brought to ED, with a-fib and O2 sat drops on 2L O2.  Diagnosed with tracheobronchitis, asthma and possible aspirations.  PMHx:  planus lichens, PNA,  hypothyroidism, wheezing, asthma, swallowing difficulty with wgt loss   Clinical Impression   Pt is functioning at a set up to supervision level. He has been walking to the bathroom with nursing and supervision for safety. Will follow to educate pt in energy conservation strategies, but do not anticipate he will require post acute OT.    Follow Up Recommendations  No OT follow up    Equipment Recommendations  None recommended by OT    Recommendations for Other Services       Precautions / Restrictions Precautions Precaution Comments: watch HR, BP      Mobility Bed Mobility Overal bed mobility: Modified Independent             General bed mobility comments: HOB up  Transfers Overall transfer level: Needs assistance   Transfers: Sit to/from Stand Sit to Stand: Supervision         General transfer comment: supervision for safety    Balance Overall balance assessment: No apparent balance deficits (not formally assessed)                                         ADL either performed or assessed with clinical judgement   ADL                                         General ADL Comments: Overall functioning at a set up to supervision level for lines.     Vision Patient Visual Report: No change from baseline       Perception     Praxis      Pertinent Vitals/Pain Pain Assessment: No/denies pain     Hand Dominance Right   Extremity/Trunk Assessment Upper Extremity Assessment Upper Extremity Assessment: Overall WFL for tasks assessed   Lower Extremity  Assessment Lower Extremity Assessment: Overall WFL for tasks assessed   Cervical / Trunk Assessment Cervical / Trunk Assessment: Normal   Communication Communication Communication: No difficulties;Other (comment) (hoarse)   Cognition Arousal/Alertness: Awake/alert Behavior During Therapy: WFL for tasks assessed/performed Overall Cognitive Status: Within Functional Limits for tasks assessed                                     General Comments  pt in afib    Exercises     Shoulder Instructions      Home Living Family/patient expects to be discharged to:: Private residence Living Arrangements: Spouse/significant other Available Help at Discharge: Family;Available 24 hours/day Type of Home: House       Home Layout: One level     Bathroom Shower/Tub: Occupational psychologist: Standard     Home Equipment: None   Additional Comments: works in Engineer, production, driving and independent      Prior Functioning/Environment Level of Independence: Independent  OT Problem List:        OT Treatment/Interventions:      OT Goals(Current goals can be found in the care plan section) Acute Rehab OT Goals Patient Stated Goal: to get back to his routine  OT Frequency:     Barriers to D/C:            Co-evaluation              AM-PAC OT "6 Clicks" Daily Activity     Outcome Measure Help from another person eating meals?: None Help from another person taking care of personal grooming?: None Help from another person toileting, which includes using toliet, bedpan, or urinal?: None Help from another person bathing (including washing, rinsing, drying)?: None Help from another person to put on and taking off regular upper body clothing?: None Help from another person to put on and taking off regular lower body clothing?: None 6 Click Score: 24   End of Session    Activity Tolerance: Patient tolerated treatment  well Patient left: in bed;with call bell/phone within reach  OT Visit Diagnosis: Other abnormalities of gait and mobility (R26.89)                Time: 9432-7614 OT Time Calculation (min): 21 min Charges:  OT General Charges $OT Visit: 1 Visit OT Evaluation $OT Eval Low Complexity: 1 Low  Nestor Lewandowsky, OTR/L Acute Rehabilitation Services Pager: [phone] Office: [phone] Malka So 01/24/2020, 8:14 AM

## 2020-01-24 NOTE — Progress Notes (Signed)
Progress Note  Patient Name: KALUB MORILLO Date of Encounter: 01/24/2020  [facility] Cardiologist: New  Subjective   Cough improving  Inpatient Medications    Scheduled Meds: . apixaban  5 mg Oral BID  . doxycycline  100 mg Oral Q12H  . folic acid  1 mg Oral Daily  . guaiFENesin  1,200 mg Oral BID  . hydrOXYzine  25 mg Oral Daily  . [START ON 01/25/2020] influenza vaccine adjuvanted  0.5 mL Intramuscular Tomorrow-1000  . ipratropium  2 spray Each Nare TID  . levothyroxine  100 mcg Oral Q24H  . methylPREDNISolone (SOLU-MEDROL) injection  40 mg Intravenous Q12H  . mometasone-formoterol  2 puff Inhalation BID  . montelukast  5 mg Oral QHS  . pantoprazole  40 mg Oral Daily  . [START ON 01/25/2020] pneumococcal 23 valent vaccine  0.5 mL Intramuscular Tomorrow-1000  . triamcinolone cream   Topical BID   Continuous Infusions: . diltiazem (CARDIZEM) infusion 15 mg/hr (01/24/20 0249)   PRN Meds: acetaminophen **OR** acetaminophen, benzonatate, dextromethorphan, levalbuterol   Vital Signs    Vitals:   01/23/20 2303 01/24/20 0015 01/24/20 0111 01/24/20 0419  BP: (!) 157/91   (!) 138/98  Pulse: (!) 119  (!) 107   Resp: 17   17  Temp: 98.3 F (36.8 C)   98.1 F (36.7 C)  TempSrc: Oral   Oral  SpO2: 95%     Weight:  (!) 137 kg    Height:        Intake/Output Summary (Last 24 hours) at 01/24/2020 0759 Last data filed at 01/24/2020 0528 Gross per 24 hour  Intake 806.75 ml  Output 1750 ml  Net -943.25 ml   Last 3 Weights 01/24/2020 01/23/2020 01/22/2020  Weight (lbs) 302 lb 299 lb 299 lb  Weight (kg) 136.986 kg 135.626 kg 135.626 kg      Telemetry    afib varaible rates - Personally Reviewed  ECG    n/a - Personally Reviewed  Physical Exam   GEN: No acute distress.   Neck: No JVD Cardiac: irreg, no murmurs, rubs, or gallops.  Respiratory: upper airway wheezing GI: Soft, nontender, non-distended  MS: No edema; No deformity. Neuro:  Nonfocal  Psych:  Normal affect   Labs    High Sensitivity Troponin:   Recent Labs  Lab 01/22/20 1330 01/22/20 1549  TROPONINIHS 22* 20*      Chemistry Recent Labs  Lab 01/22/20 1330 01/23/20 0535  NA 137 132*  K 3.7 4.3  CL 106 106  CO2 20* 19*  GLUCOSE 105* 152*  BUN 10 12  CREATININE 1.26* 1.25*  CALCIUM 8.6* 8.8*  PROT 6.0*  --   ALBUMIN 3.2*  --   AST 15  --   ALT 18  --   ALKPHOS 44  --   BILITOT 0.9  --   GFRNONAA 57* 58*  GFRAA >60 >60  ANIONGAP 11 7     Hematology Recent Labs  Lab 01/22/20 1330 01/23/20 0535  WBC 8.7 5.9  RBC 4.50 4.44  HGB 13.2 12.9*  HCT 41.3 40.6  MCV 91.8 91.4  MCH 29.3 29.1  MCHC 32.0 31.8  RDW 13.7 13.8  PLT 231 212    BNP Recent Labs  Lab 01/22/20 1322  BNP 184.6*     DDimer No results for input(s): DDIMER in the last 168 hours.   Radiology    CT Chest Wo Contrast  Result Date: 01/22/2020 CLINICAL DATA:  Persistent cough EXAM: CT  CHEST WITHOUT CONTRAST TECHNIQUE: Multidetector CT imaging of the chest was performed following the standard protocol without IV contrast. COMPARISON:  Radiograph same day FINDINGS: Cardiovascular: There are no significant vascular findings. Scattered vascular calcifications are seen within the aorta. Coronary artery calcifications are seen. The heart size is normal. There is no pericardial thickening or effusion. Mediastinum/Nodes: There are no enlarged mediastinal, hilar or axillary lymph nodes. The thyroid gland, trachea and esophagus demonstrate no significant findings. Lungs/Pleura: Rounded patchy airspace opacity is seen within the posterior left lung base with mild bronchial wall thickening. There is small tree-in-bud opacities also noted within the right lung base. Upper abdomen: The visualized portion of the upper abdomen is unremarkable. Musculoskeletal/Chest wall: There is no chest wall mass or suspicious osseous finding. No acute osseous abnormality IMPRESSION: Patchy airspace opacity with  tree-in-bud opacities at the posterior left lung base which could be due to resolving infectious etiology from August 2021 or inflammatory process. Electronically Signed   By: Prudencio Pair M.D.   On: 01/22/2020 17:56   DG Chest Portable 1 View  Result Date: 01/22/2020 CLINICAL DATA:  Cough for weeks EXAM: PORTABLE CHEST 1 VIEW COMPARISON:  01/01/2020 FINDINGS: Normal heart size, mediastinal contours, and pulmonary vascularity. Lungs clear. Resolution of LEFT mid lung infiltrates since previous exam. No pleural effusion or pneumothorax. Osseous structures unremarkable. IMPRESSION: Resolution of previously identified pneumonia in LEFT mid lung. No new abnormalities. Electronically Signed   By: Lavonia Dana M.D.   On: 01/22/2020 13:50   ECHOCARDIOGRAM COMPLETE  Result Date: 01/23/2020    ECHOCARDIOGRAM REPORT   Patient Name:   JOHNWILLIAM SHEPPERSON Date of Exam: 01/23/2020 Medical Rec #:  [id number]       Height:       72.0 in Accession #:    [id number]      Weight:       299.0 lb Date of Birth:  November 22, 1945      BSA:          2.528 m Patient Age:    74 years        BP:           115/77 mmHg Patient Gender: M               HR:           110 bpm. Exam Location:  Inpatient Procedure: 2D Echo, Cardiac Doppler, Color Doppler and Intracardiac            Opacification Agent Indications:    R06.02 SOB; I48.91* Unspeicified atrial fibrillation  History:        Patient has no prior history of Echocardiogram examinations.                 Abnormal ECG, Arrythmias:Atrial Fibrillation;                 Signs/Symptoms:Dyspnea. Pneumonia.  Sonographer:    Roseanna Rainbow RDCS Referring Phys: [id number] [place] Comments: Technically difficult study due to poor echo windows. Image acquisition challenging due to respiratory motion. Imaging difficult due to respiratory difficulty. IMPRESSIONS  1. Left ventricular ejection fraction, by estimation, is 55 to 60%. The left ventricle has normal function. The left ventricle has no  regional wall motion abnormalities. There is mild concentric left ventricular hypertrophy. Left ventricular diastolic function could not be evaluated.  2. Right ventricular systolic function is normal. The right ventricular size is normal. Tricuspid regurgitation signal is inadequate for assessing PA pressure.  3.  The mitral valve is grossly normal. Mild to moderate mitral valve regurgitation. No evidence of mitral stenosis.  4. The aortic valve is tricuspid. There is moderate calcification of the aortic valve. There is moderate thickening of the aortic valve. Aortic valve regurgitation is not visualized. Mild to moderate aortic valve sclerosis/calcification is present, without any evidence of aortic stenosis.  5. Aortic dilatation noted. There is mild dilatation of the aortic root, measuring 42 mm.  6. The inferior vena cava is dilated in size with >50% respiratory variability, suggesting right atrial pressure of 8 mmHg. FINDINGS  Left Ventricle: Left ventricular ejection fraction, by estimation, is 55 to 60%. The left ventricle has normal function. The left ventricle has no regional wall motion abnormalities. Definity contrast agent was given IV to delineate the left ventricular  endocardial borders. The left ventricular internal cavity size was normal in size. There is mild concentric left ventricular hypertrophy. Left ventricular diastolic function could not be evaluated due to atrial fibrillation. Left ventricular diastolic function could not be evaluated. Right Ventricle: The right ventricular size is normal. No increase in right ventricular wall thickness. Right ventricular systolic function is normal. Tricuspid regurgitation signal is inadequate for assessing PA pressure. Left Atrium: Left atrial size was normal in size. Right Atrium: Right atrial size was normal in size. Pericardium: Trivial pericardial effusion is present. Presence of pericardial fat pad. Mitral Valve: The mitral valve is grossly normal.  Mild to moderate mitral valve regurgitation. No evidence of mitral valve stenosis. Tricuspid Valve: The tricuspid valve is grossly normal. Tricuspid valve regurgitation is trivial. No evidence of tricuspid stenosis. Aortic Valve: The aortic valve is tricuspid. There is moderate calcification of the aortic valve. There is moderate thickening of the aortic valve. Aortic valve regurgitation is not visualized. Mild to moderate aortic valve sclerosis/calcification is present, without any evidence of aortic stenosis. Aortic valve mean gradient measures 7.0 mmHg. Aortic valve peak gradient measures 13.5 mmHg. Aortic valve area, by VTI measures 3.68 cm. Pulmonic Valve: The pulmonic valve was grossly normal. Pulmonic valve regurgitation is not visualized. No evidence of pulmonic stenosis. Aorta: Aortic dilatation noted. There is mild dilatation of the aortic root, measuring 42 mm. Venous: The inferior vena cava is dilated in size with greater than 50% respiratory variability, suggesting right atrial pressure of 8 mmHg. IAS/Shunts: The atrial septum is grossly normal.  LEFT VENTRICLE PLAX 2D LVIDd:         4.60 cm LVIDs:         4.10 cm LV PW:         1.20 cm LV IVS:        1.20 cm LVOT diam:     2.80 cm LV SV:         120 LV SV Index:   48 LVOT Area:     6.16 cm  LV Volumes (MOD) LV vol d, MOD A2C: 62.2 ml LV vol d, MOD A4C: 115.5 ml LV vol s, MOD A2C: 29.0 ml LV vol s, MOD A4C: 64.6 ml LV SV MOD A2C:     33.2 ml LV SV MOD A4C:     115.5 ml LV SV MOD BP:      43.8 ml RIGHT VENTRICLE         IVC TAPSE (M-mode): 1.6 cm  IVC diam: 2.80 cm LEFT ATRIUM             Index       RIGHT ATRIUM  Index LA diam:        4.40 cm 1.74 cm/m  RA Area:     20.80 cm LA Vol (A2C):   86.5 ml 34.22 ml/m RA Volume:   49.40 ml  19.54 ml/m LA Vol (A4C):   73.5 ml 29.08 ml/m LA Biplane Vol: 81.5 ml 32.24 ml/m  AORTIC VALVE AV Area (Vmax):    3.61 cm AV Area (Vmean):   4.06 cm AV Area (VTI):     3.68 cm AV Vmax:           184.00  cm/s AV Vmean:          119.000 cm/s AV VTI:            0.326 m AV Peak Grad:      13.5 mmHg AV Mean Grad:      7.0 mmHg LVOT Vmax:         108.00 cm/s LVOT Vmean:        78.500 cm/s LVOT VTI:          0.195 m LVOT/AV VTI ratio: 0.60  AORTA Ao Root diam: 4.20 cm Ao Asc diam:  4.10 cm MR PISA:        0.25 cm MR PISA Radius: 0.20 cm  SHUNTS                          Systemic VTI:  0.20 m                          Systemic Diam: 2.80 cm Eleonore Chiquito MD Electronically signed by Eleonore Chiquito MD Signature Date/Time: 01/23/2020/11:06:07 AM    Final     Cardiac Studies    Patient Profile     BARRIE WALE is a 74 y.o. male with a hx of lichen planus with methotrexate and chronic prednisone use, and hypothyroidism who is being seen today for the evaluation of new onset AF with RVR at the request of Dr. Darrick Meigs.  Assessment & Plan    1. Afib with RVR - new diagnosis this admission. Recent issues with pneumonia, on abx and steroids at home - has been on dilt gtt, started on eliqius 5mg  bid.   - on dilt gtt, transition to oral. Start oral dilt short acting 60mg  qid and wean dilt gtt - if can rate control as inpatient, would consider outpatient DCCV after 3 weeks of anticoag.    2. Mitral regurgitation - mild to mod by echo, monitor as outpatient  3. Aortic root diltation - mild by echo 42 mm, monitor as outpatient.   4. Bronchitis/Aspiration - followed by pulm this admit  For questions or updates, please contact [place] Please consult [email or web address] for contact info under        Signed, Carlyle Dolly, MD  01/24/2020, 7:59 AM

## 2020-01-24 NOTE — Progress Notes (Signed)
PCCM:   Thanks for seeing him.   Garner Nash, DO [facility] 01/24/2020 2:34 PM

## 2020-01-25 LAB — QUANTIFERON-TB GOLD PLUS (RQFGPL)
QuantiFERON Mitogen Value: 4.4 IU/mL
QuantiFERON Nil Value: 0.03 IU/mL
QuantiFERON TB1 Ag Value: 0.02 IU/mL
QuantiFERON TB2 Ag Value: 0.03 IU/mL

## 2020-01-25 LAB — QUANTIFERON-TB GOLD PLUS: QuantiFERON-TB Gold Plus: NEGATIVE

## 2020-01-25 LAB — BASIC METABOLIC PANEL
Anion gap: 13 (ref 5–15)
BUN: 18 mg/dL (ref 8–23)
CO2: 22 mmol/L (ref 22–32)
Calcium: 8.9 mg/dL (ref 8.9–10.3)
Chloride: 103 mmol/L (ref 98–111)
Creatinine, Ser: 1.06 mg/dL (ref 0.61–1.24)
GFR calc Af Amer: >60 mL/min (ref >60)
GFR calc non Af Amer: >60 mL/min (ref >60)
Glucose, Bld: 120 mg/dL — ABNORMAL HIGH (ref 70–99)
Potassium: 4.1 mmol/L (ref 3.5–5.1)
Sodium: 138 mmol/L (ref 135–145)

## 2020-01-25 LAB — CULTURE, RESPIRATORY W GRAM STAIN: Culture: NORMAL

## 2020-01-25 MED ORDER — MENTHOL 3 MG MT LOZG
1.0000 | LOZENGE | OROMUCOSAL | Status: DC | PRN
  Administered 2020-01-25 – 2020-01-26 (×4): 3 mg via ORAL
  Filled 2020-01-25: qty 9

## 2020-01-25 MED ORDER — DILTIAZEM HCL ER COATED BEADS 180 MG PO CP24
300.0000 mg | ORAL_CAPSULE | Freq: Every day | ORAL | Status: DC
  Administered 2020-01-25 – 2020-01-27 (×3): 300 mg via ORAL
  Filled 2020-01-25 (×3): qty 1

## 2020-01-25 NOTE — Progress Notes (Signed)
   01/25/20 2129  Assess: MEWS Score  Temp 98.4 F (36.9 C)  BP (!) 134/97  Pulse Rate 92  ECG Heart Rate (!) 111  Resp (!) 21  SpO2 95 %  O2 Device Room Air  Assess: MEWS Score  MEWS Temp 0  MEWS Systolic 0  MEWS Pulse 2  MEWS RR 1  MEWS LOC 0  MEWS Score 3  MEWS Score Color Yellow  Assess: if the MEWS score is Yellow or Red  Were vital signs taken at a resting state? Yes  Focused Assessment Change from prior assessment (see assessment flowsheet)  Early Detection of Sepsis Score *See Row Information* Low  MEWS guidelines implemented *See Row Information* Yes  Treat  MEWS Interventions Administered scheduled meds/treatments;Administered prn meds/treatments  Pain Scale 0-10  Pain Score 0  Take Vital Signs  Increase Vital Sign Frequency  Yellow: Q 2hr X 2 then Q 4hr X 2, if remains yellow, continue Q 4hrs  Escalate  MEWS: Escalate Yellow: discuss with charge nurse/RN and consider discussing with provider and RRT

## 2020-01-25 NOTE — Progress Notes (Signed)
Progress Note  Patient Name: Todd Harrison Date of Encounter: 01/25/2020  [facility] Cardiologist: New  Subjective   No complaints  Inpatient Medications    Scheduled Meds: . apixaban  5 mg Oral BID  . diltiazem  60 mg Oral Q6H  . doxycycline  100 mg Oral Q12H  . folic acid  1 mg Oral Daily  . guaiFENesin  1,200 mg Oral BID  . hydrOXYzine  25 mg Oral Daily  . influenza vaccine adjuvanted  0.5 mL Intramuscular Tomorrow-1000  . ipratropium  2 spray Each Nare TID  . levothyroxine  100 mcg Oral Q24H  . methylPREDNISolone (SOLU-MEDROL) injection  40 mg Intravenous Q12H  . mometasone-formoterol  2 puff Inhalation BID  . montelukast  5 mg Oral QHS  . pantoprazole  40 mg Oral Daily  . pneumococcal 23 valent vaccine  0.5 mL Intramuscular Tomorrow-1000  . triamcinolone cream   Topical BID   Continuous Infusions: . diltiazem (CARDIZEM) infusion 15 mg/hr (01/24/20 0249)   PRN Meds: acetaminophen **OR** acetaminophen, benzonatate, dextromethorphan, levalbuterol   Vital Signs    Vitals:   01/24/20 1100 01/24/20 1346 01/24/20 2017 01/25/20 0524  BP: 120/62 127/67 115/76 115/82  Pulse: 85  89 97  Resp: 18 13 18  (!) 21  Temp: 98.4 F (36.9 C)  98.1 F (36.7 C) 98.1 F (36.7 C)  TempSrc: Oral  Oral Oral  SpO2: 97%  95% 97%  Weight:    (!) 136.9 kg  Height:        Intake/Output Summary (Last 24 hours) at 01/25/2020 0734 Last data filed at 01/24/2020 1300 Gross per 24 hour  Intake 720 ml  Output 600 ml  Net 120 ml   Last 3 Weights 01/25/2020 01/24/2020 01/23/2020  Weight (lbs) 301 lb 11.2 oz 302 lb 299 lb  Weight (kg) 136.85 kg 136.986 kg 135.626 kg      Telemetry    afib 90s to 110s - Personally Reviewed  ECG    n/a - Personally Reviewed  Physical Exam   GEN: No acute distress.   Neck: No JVD Cardiac: irreg, tach Respiratory: Clear to auscultation bilaterally. GI: Soft, nontender, non-distended  MS: No edema; No deformity. Neuro:  Nonfocal  Psych:  Normal affect   Labs    High Sensitivity Troponin:   Recent Labs  Lab 01/22/20 1330 01/22/20 1549  TROPONINIHS 22* 20*      Chemistry Recent Labs  Lab 01/22/20 1330 01/23/20 0535 01/24/20 0608  NA 137 132* 136  K 3.7 4.3 4.8  CL 106 106 106  CO2 20* 19* 17*  GLUCOSE 105* 152* 126*  BUN 10 12 18   CREATININE 1.26* 1.25* 1.39*  CALCIUM 8.6* 8.8* 9.4  PROT 6.0*  --   --   ALBUMIN 3.2*  --   --   AST 15  --   --   ALT 18  --   --   ALKPHOS 44  --   --   BILITOT 0.9  --   --   GFRNONAA 57* 58* 51*  GFRAA >60 >60 59*  ANIONGAP 11 7 13      Hematology Recent Labs  Lab 01/22/20 1330 01/23/20 0535  WBC 8.7 5.9  RBC 4.50 4.44  HGB 13.2 12.9*  HCT 41.3 40.6  MCV 91.8 91.4  MCH 29.3 29.1  MCHC 32.0 31.8  RDW 13.7 13.8  PLT 231 212    BNP Recent Labs  Lab 01/22/20 1322  BNP 184.6*  DDimer No results for input(s): DDIMER in the last 168 hours.   Radiology    ECHOCARDIOGRAM COMPLETE  Result Date: 01/23/2020    ECHOCARDIOGRAM REPORT   Patient Name:   Todd Harrison Date of Exam: 01/23/2020 Medical Rec #:  [id number]       Height:       72.0 in Accession #:    [id number]      Weight:       299.0 lb Date of Birth:  05-28-1945      BSA:          2.528 m Patient Age:    74 years        BP:           115/77 mmHg Patient Gender: M               HR:           110 bpm. Exam Location:  Inpatient Procedure: 2D Echo, Cardiac Doppler, Color Doppler and Intracardiac            Opacification Agent Indications:    R06.02 SOB; I48.91* Unspeicified atrial fibrillation  History:        Patient has no prior history of Echocardiogram examinations.                 Abnormal ECG, Arrythmias:Atrial Fibrillation;                 Signs/Symptoms:Dyspnea. Pneumonia.  Sonographer:    Roseanna Rainbow RDCS Referring Phys: [id number] [place] Comments: Technically difficult study due to poor echo windows. Image acquisition challenging due to respiratory motion. Imaging difficult due to  respiratory difficulty. IMPRESSIONS  1. Left ventricular ejection fraction, by estimation, is 55 to 60%. The left ventricle has normal function. The left ventricle has no regional wall motion abnormalities. There is mild concentric left ventricular hypertrophy. Left ventricular diastolic function could not be evaluated.  2. Right ventricular systolic function is normal. The right ventricular size is normal. Tricuspid regurgitation signal is inadequate for assessing PA pressure.  3. The mitral valve is grossly normal. Mild to moderate mitral valve regurgitation. No evidence of mitral stenosis.  4. The aortic valve is tricuspid. There is moderate calcification of the aortic valve. There is moderate thickening of the aortic valve. Aortic valve regurgitation is not visualized. Mild to moderate aortic valve sclerosis/calcification is present, without any evidence of aortic stenosis.  5. Aortic dilatation noted. There is mild dilatation of the aortic root, measuring 42 mm.  6. The inferior vena cava is dilated in size with >50% respiratory variability, suggesting right atrial pressure of 8 mmHg. FINDINGS  Left Ventricle: Left ventricular ejection fraction, by estimation, is 55 to 60%. The left ventricle has normal function. The left ventricle has no regional wall motion abnormalities. Definity contrast agent was given IV to delineate the left ventricular  endocardial borders. The left ventricular internal cavity size was normal in size. There is mild concentric left ventricular hypertrophy. Left ventricular diastolic function could not be evaluated due to atrial fibrillation. Left ventricular diastolic function could not be evaluated. Right Ventricle: The right ventricular size is normal. No increase in right ventricular wall thickness. Right ventricular systolic function is normal. Tricuspid regurgitation signal is inadequate for assessing PA pressure. Left Atrium: Left atrial size was normal in size. Right Atrium: Right  atrial size was normal in size. Pericardium: Trivial pericardial effusion is present. Presence of pericardial fat pad. Mitral Valve: The mitral valve  is grossly normal. Mild to moderate mitral valve regurgitation. No evidence of mitral valve stenosis. Tricuspid Valve: The tricuspid valve is grossly normal. Tricuspid valve regurgitation is trivial. No evidence of tricuspid stenosis. Aortic Valve: The aortic valve is tricuspid. There is moderate calcification of the aortic valve. There is moderate thickening of the aortic valve. Aortic valve regurgitation is not visualized. Mild to moderate aortic valve sclerosis/calcification is present, without any evidence of aortic stenosis. Aortic valve mean gradient measures 7.0 mmHg. Aortic valve peak gradient measures 13.5 mmHg. Aortic valve area, by VTI measures 3.68 cm. Pulmonic Valve: The pulmonic valve was grossly normal. Pulmonic valve regurgitation is not visualized. No evidence of pulmonic stenosis. Aorta: Aortic dilatation noted. There is mild dilatation of the aortic root, measuring 42 mm. Venous: The inferior vena cava is dilated in size with greater than 50% respiratory variability, suggesting right atrial pressure of 8 mmHg. IAS/Shunts: The atrial septum is grossly normal.  LEFT VENTRICLE PLAX 2D LVIDd:         4.60 cm LVIDs:         4.10 cm LV PW:         1.20 cm LV IVS:        1.20 cm LVOT diam:     2.80 cm LV SV:         120 LV SV Index:   48 LVOT Area:     6.16 cm  LV Volumes (MOD) LV vol d, MOD A2C: 62.2 ml LV vol d, MOD A4C: 115.5 ml LV vol s, MOD A2C: 29.0 ml LV vol s, MOD A4C: 64.6 ml LV SV MOD A2C:     33.2 ml LV SV MOD A4C:     115.5 ml LV SV MOD BP:      43.8 ml RIGHT VENTRICLE         IVC TAPSE (M-mode): 1.6 cm  IVC diam: 2.80 cm LEFT ATRIUM             Index       RIGHT ATRIUM           Index LA diam:        4.40 cm 1.74 cm/m  RA Area:     20.80 cm LA Vol (A2C):   86.5 ml 34.22 ml/m RA Volume:   49.40 ml  19.54 ml/m LA Vol (A4C):   73.5 ml 29.08  ml/m LA Biplane Vol: 81.5 ml 32.24 ml/m  AORTIC VALVE AV Area (Vmax):    3.61 cm AV Area (Vmean):   4.06 cm AV Area (VTI):     3.68 cm AV Vmax:           184.00 cm/s AV Vmean:          119.000 cm/s AV VTI:            0.326 m AV Peak Grad:      13.5 mmHg AV Mean Grad:      7.0 mmHg LVOT Vmax:         108.00 cm/s LVOT Vmean:        78.500 cm/s LVOT VTI:          0.195 m LVOT/AV VTI ratio: 0.60  AORTA Ao Root diam: 4.20 cm Ao Asc diam:  4.10 cm MR PISA:        0.25 cm MR PISA Radius: 0.20 cm  SHUNTS  Systemic VTI:  0.20 m                          Systemic Diam: 2.80 cm Eleonore Chiquito MD Electronically signed by Eleonore Chiquito MD Signature Date/Time: 01/23/2020/11:06:07 AM    Final     Cardiac Studies   01/2020 echo 1. Left ventricular ejection fraction, by estimation, is 55 to 60%. The  left ventricle has normal function. The left ventricle has no regional  wall motion abnormalities. There is mild concentric left ventricular  hypertrophy. Left ventricular diastolic  function could not be evaluated.  2. Right ventricular systolic function is normal. The right ventricular  size is normal. Tricuspid regurgitation signal is inadequate for assessing  PA pressure.  3. The mitral valve is grossly normal. Mild to moderate mitral valve  regurgitation. No evidence of mitral stenosis.  4. The aortic valve is tricuspid. There is moderate calcification of the  aortic valve. There is moderate thickening of the aortic valve. Aortic  valve regurgitation is not visualized. Mild to moderate aortic valve  sclerosis/calcification is present,  without any evidence of aortic stenosis.  5. Aortic dilatation noted. There is mild dilatation of the aortic root,  measuring 42 mm.  6. The inferior vena cava is dilated in size with >50% respiratory  variability, suggesting right atrial pressure of 8 mmHg.   Patient Profile     Todd Harrison a 75 y.o.malewith a hx of lichen  planuswith methotrexate and chronic prednisoneuse,andhypothyroidismwho is being seen today for the evaluation of new onset AF with RVRat the request of Dr. Darrick Meigs.  Assessment & Plan    1. Afib with RVR - new diagnosis this admission. Recent issues with pneumonia, on abx and steroids at home - has been on dilt gtt, started on eliqius 5mg  bid.   - weaned off dilt drip yesterday, on short acting dilt 60mg  qid. Rates 90s to 110s, bp's are WNL - we will change to long acting dilt 300mg  daily - if can rate control inpatient, would consider outpatient DCCV after 3 weeks of anticoag if remains in afib   2. Mitral regurgitation - mild to mod by echo, monitor as outpatient  3. Aortic root diltation - mild by echo 42 mm, monitor as outpatient.   4. Bronchitis/Aspiration - followed by pulm this admit  For questions or updates, please contact [place] Please consult [email or web address] for contact info under        Signed, Carlyle Dolly, MD  01/25/2020, 7:34 AM

## 2020-01-25 NOTE — Progress Notes (Signed)
I agree with charting from previous nurse this shift

## 2020-01-25 NOTE — Progress Notes (Signed)
[facility]  PROGRESS NOTE  Todd Harrison YTK:[id number] DOB: 08-21-1945 DOA: 01/22/2020 PCP: Maximiano Coss, NP   Brief HPI:   74 year old male with a history of lichen planus, psoriasis, hypothyroidism presents with cough and wheezing. Patient states symptoms started 5 weeks ago he was seen by pulmonologist, was diagnosed with left-sided pneumonia and acute asthma exacerbation for which she was treated with Medrol Dosepak along with Levaquin. Patient only had modest improvement and started having cough again with greenish sputum. He went to pulmonologist office and was found to be in A. fib with RVR with heart rate in 130s. Chest x-ray showed resolving left middle lobe pneumonia. Patient was diagnosed with lichen planus/psoriasis of the feet used to be on methotrexate which was stopped recently given lack of response. And he was started on mycophenolate and Soriatane which was added about 2 weeks ago.   Subjective   Patient seen and examined, he has been off IV Cardizem since yesterday.  Started on Cardizem 10 mg long-acting per cardiology.  He is also on Eliquis.   Assessment/Plan:     1. Atrial fibrillation RVR- CHA2DS2VASc score is 1, patient started on aspirin. Echocardiogram showed mild to moderate MR, EF 55 to 60% . TSH is 1.171.  Patient was started on Cardizem gtt. for rate control.  Patient started on oral Cardizem 300 mg long-acting daily.   Cardiology is planning to do outpatient DCCV after 3 weeks of anticoagulation.  Patient started on Eliquis.  Will monitor for 1 more day if heart rate is controlled likely discharge in a.m. 2. Acute persistent asthma-no previous history of COPD or asthma. No history of tobacco use. Patient recently had pneumonia which has been treated. Chest x-ray showed resolved pneumonia. Started on IV Solu-Medrol. Antibiotics are currently on hold. Procalcitonin less than 0.10. Continue scheduled ipratropium every 6 hours. Follow-up QuantiFERON-TB  gold test results. Respiratory panel is negative. 3. Hypothyroidism-TSH 1.171. Continue Synthroid 100 mcg daily. 4. Lichen planus/psoriasis-we will hold mycophenolate, patient is already on methylprednisolone as above. Continue Soriatane. 5. GERD-continue Protonix. Patient having intermittent problem with swallowing. Swallow evaluation done.  Patient started on regular diet with thin liquids.      COVID-19 Labs  No results for input(s): DDIMER, FERRITIN, LDH, CRP in the last 72 hours.  Lab Results  Component Value Date   SARSCOV2NAA NEGATIVE 01/22/2020   [place] Not Detected 01/01/2020   [place] NEGATIVE 06/09/2019     Scheduled medications:    apixaban  5 mg Oral BID   diltiazem  300 mg Oral Daily   doxycycline  100 mg Oral F57D   folic acid  1 mg Oral Daily   guaiFENesin  1,200 mg Oral BID   hydrOXYzine  25 mg Oral Daily   influenza vaccine adjuvanted  0.5 mL Intramuscular Tomorrow-1000   ipratropium  2 spray Each Nare TID   levothyroxine  100 mcg Oral Q24H   methylPREDNISolone (SOLU-MEDROL) injection  40 mg Intravenous Q12H   mometasone-formoterol  2 puff Inhalation BID   montelukast  5 mg Oral QHS   pantoprazole  40 mg Oral Daily   pneumococcal 23 valent vaccine  0.5 mL Intramuscular Tomorrow-1000   triamcinolone cream   Topical BID         CBG: No results for input(s): GLUCAP in the last 168 hours.  SpO2: 95 % O2 Flow Rate (L/min): 2 L/min    CBC: Recent Labs  Lab 01/22/20 1330 01/23/20 0535  WBC 8.7 5.9  NEUTROABS 6.4  --  HGB 13.2 12.9*  HCT 41.3 40.6  MCV 91.8 91.4  PLT 231 778    Basic Metabolic Panel: Recent Labs  Lab 01/22/20 1330 01/23/20 0535 01/24/20 0608  NA 137 132* 136  K 3.7 4.3 4.8  CL 106 106 106  CO2 20* 19* 17*  GLUCOSE 105* 152* 126*  BUN 10 12 18   CREATININE 1.26* 1.25* 1.39*  CALCIUM 8.6* 8.8* 9.4  MG 2.1  --   --      Liver Function Tests: Recent Labs  Lab 01/22/20 1330  AST 15  ALT  18  ALKPHOS 44  BILITOT 0.9  PROT 6.0*  ALBUMIN 3.2*     Antibiotics: Anti-infectives (From admission, onward)   Start     Dose/Rate Route Frequency Ordered Stop   01/23/20 2200  doxycycline (VIBRA-TABS) tablet 100 mg        100 mg Oral Every 12 hours 01/23/20 1712         DVT prophylaxis: Lovenox  Code Status: Full code  Family Communication: No family at bedside    Status is: Inpatient  Dispo: The patient is from: Home              Anticipated d/c is to: Home              Anticipated d/c date is: 01/26/2020              Patient currently not medically stable for discharge  Barrier to discharge-atrial fibrillation, started on p.o. Cardizem.  Monitor for 1 day likely discharge in a.m.     Consultants:  Cardiology  Procedures:     Objective   Vitals:   01/24/20 1346 01/24/20 2017 01/25/20 0524 01/25/20 0747  BP: 127/67 115/76 115/82   Pulse:  89 97   Resp: 13 18 (!) 21   Temp:  98.1 F (36.7 C) 98.1 F (36.7 C)   TempSrc:  Oral Oral   SpO2:  95% 97% 95%  Weight:   (!) 136.9 kg   Height:        Intake/Output Summary (Last 24 hours) at 01/25/2020 0910 Last data filed at 01/25/2020 0900 Gross per 24 hour  Intake 480 ml  Output 900 ml  Net -420 ml    09/17 1901 - 09/19 0700 In: 860.3 [P.O.:720; I.V.:140.3] Out: 1700 [Urine:1700]  Filed Weights   01/23/20 1630 01/24/20 0015 01/25/20 0524  Weight: 135.6 kg (!) 137 kg (!) 136.9 kg    Physical Examination:   General-appears in no acute distress Heart-S1-S2, irregular, no murmur auscultated Lungs-scattered rhonchi bilaterally Abdomen-soft, nontender, no organomegaly Extremities-no edema in the lower extremities Neuro-alert, oriented x3, no focal deficit noted  Data Reviewed:   Recent Results (from the past 240 hour(s))  SARS Coronavirus 2 by RT PCR (hospital order, performed in [place] hospital lab) Nasopharyngeal Nasopharyngeal Swab     Status: None   Collection Time: 01/22/20  1:58  PM   Specimen: Nasopharyngeal Swab  Result Value Ref Range Status   SARS Coronavirus 2 NEGATIVE NEGATIVE Final    Comment: (NOTE) SARS-CoV-2 target nucleic acids are NOT DETECTED.  The SARS-CoV-2 RNA is generally detectable in upper and lower respiratory specimens during the acute phase of infection. The lowest concentration of SARS-CoV-2 viral copies this assay can detect is 250 copies / mL. A negative result does not preclude SARS-CoV-2 infection and should not be used as the sole basis for treatment or other patient management decisions.  A negative result may occur with  improper specimen collection / handling, submission of specimen other than nasopharyngeal swab, presence of viral mutation(s) within the areas targeted by this assay, and inadequate number of viral copies (<250 copies / mL). A negative result must be combined with clinical observations, patient history, and epidemiological information.  Fact Sheet for Patients:   StrictlyIdeas.no  Fact Sheet for Healthcare Providers: BankingDealers.co.za  This test is not yet approved or  cleared by the [place] FDA and has been authorized for detection and/or diagnosis of SARS-CoV-2 by FDA under an Emergency Use Authorization (EUA).  This EUA will remain in effect (meaning this test can be used) for the duration of the COVID-19 declaration under Section 564(b)(1) of the Act, 21 U.S.C. section 360bbb-3(b)(1), unless the authorization is terminated or revoked sooner.  Performed at [facility], [place] [address], [place], [place] [address]   Culture, blood (routine x 2)     Status: None (Preliminary result)   Collection Time: 01/22/20  1:59 PM   Specimen: BLOOD LEFT HAND  Result Value Ref Range Status   Specimen Description BLOOD LEFT HAND  Final   Special Requests   Final    BOTTLES DRAWN AEROBIC AND ANAEROBIC Blood Culture adequate volume   Culture   Final    NO  GROWTH 2 DAYS Performed at [facility], [place] [address] [place], [place] [address]    Report Status PENDING  Incomplete  Culture, blood (routine x 2)     Status: None (Preliminary result)   Collection Time: 01/22/20  1:59 PM   Specimen: BLOOD  Result Value Ref Range Status   Specimen Description BLOOD LEFT ANTECUBITAL  Final   Special Requests   Final    BOTTLES DRAWN AEROBIC AND ANAEROBIC Blood Culture adequate volume   Culture   Final    NO GROWTH 2 DAYS Performed at [facility], [place] [address], [place], [place] [address]    Report Status PENDING  Incomplete  Expectorated sputum assessment w rflx to resp cult     Status: None   Collection Time: 01/22/20  7:42 PM   Specimen: Expectorated Sputum  Result Value Ref Range Status   Specimen Description EXPECTORATED SPUTUM  Final   Special Requests NONE  Final   Sputum evaluation   Final    THIS SPECIMEN IS ACCEPTABLE FOR SPUTUM CULTURE Performed at [facility], [place] [address], [place], [place] [address]    Report Status 01/22/2020 FINAL  Final  Culture, respiratory     Status: None (Preliminary result)   Collection Time: 01/22/20  7:42 PM  Result Value Ref Range Status   Specimen Description EXPECTORATED SPUTUM  Final   Special Requests NONE Reflexed from [id number]  Final   Gram Stain   Final    MODERATE WBC PRESENT, PREDOMINANTLY PMN ABUNDANT GRAM POSITIVE COCCI FEW GRAM VARIABLE ROD    Culture   Final    CULTURE REINCUBATED FOR BETTER GROWTH Performed at [facility], [place] [address], [place],  [address]    Report Status PENDING  Incomplete  Respiratory Panel by PCR     Status: None   Collection Time: 01/22/20 10:29 PM   Specimen: Nasopharyngeal Swab; Respiratory  Result Value Ref Range Status   Adenovirus NOT DETECTED NOT DETECTED Final   Coronavirus 229E NOT DETECTED NOT DETECTED Final    Comment: (NOTE) The Coronavirus on the Respiratory Panel, DOES NOT test for the novel  Coronavirus  (2019 nCoV)  Coronavirus HKU1 NOT DETECTED NOT DETECTED Final   Coronavirus NL63 NOT DETECTED NOT DETECTED Final   Coronavirus OC43 NOT DETECTED NOT DETECTED Final   Metapneumovirus NOT DETECTED NOT DETECTED Final   Rhinovirus / Enterovirus NOT DETECTED NOT DETECTED Final   Influenza A NOT DETECTED NOT DETECTED Final   Influenza B NOT DETECTED NOT DETECTED Final   Parainfluenza Virus 1 NOT DETECTED NOT DETECTED Final   Parainfluenza Virus 2 NOT DETECTED NOT DETECTED Final   Parainfluenza Virus 3 NOT DETECTED NOT DETECTED Final   Parainfluenza Virus 4 NOT DETECTED NOT DETECTED Final   Respiratory Syncytial Virus NOT DETECTED NOT DETECTED Final   Bordetella pertussis NOT DETECTED NOT DETECTED Final   Chlamydophila pneumoniae NOT DETECTED NOT DETECTED Final   Mycoplasma pneumoniae NOT DETECTED NOT DETECTED Final    Comment: Performed at [facility], [place] [address], [place], [place] [address]     BNP (last 3 results) Recent Labs    01/22/20 1322  BNP 184.6*    ProBNP (last 3 results) No results for input(s): PROBNP in the last 8760 hours.  Studies:  ECHOCARDIOGRAM COMPLETE  Result Date: 01/23/2020    ECHOCARDIOGRAM REPORT   Patient Name:   Todd Harrison Date of Exam: 01/23/2020 Medical Rec #:  [id number]       Height:       72.0 in Accession #:    [id number]      Weight:       299.0 lb Date of Birth:  05-31-1945      BSA:          2.528 m Patient Age:    36 years        BP:           115/77 mmHg Patient Gender: M               HR:           110 bpm. Exam Location:  Inpatient Procedure: 2D Echo, Cardiac Doppler, Color Doppler and Intracardiac            Opacification Agent Indications:    R06.02 SOB; I48.91* Unspeicified atrial fibrillation  History:        Patient has no prior history of Echocardiogram examinations.                 Abnormal ECG, Arrythmias:Atrial Fibrillation;                 Signs/Symptoms:Dyspnea. Pneumonia.  Sonographer:    Roseanna Rainbow RDCS Referring Phys:  [id number] [place] Comments: Technically difficult study due to poor echo windows. Image acquisition challenging due to respiratory motion. Imaging difficult due to respiratory difficulty. IMPRESSIONS  1. Left ventricular ejection fraction, by estimation, is 55 to 60%. The left ventricle has normal function. The left ventricle has no regional wall motion abnormalities. There is mild concentric left ventricular hypertrophy. Left ventricular diastolic function could not be evaluated.  2. Right ventricular systolic function is normal. The right ventricular size is normal. Tricuspid regurgitation signal is inadequate for assessing PA pressure.  3. The mitral valve is grossly normal. Mild to moderate mitral valve regurgitation. No evidence of mitral stenosis.  4. The aortic valve is tricuspid. There is moderate calcification of the aortic valve. There is moderate thickening of the aortic valve. Aortic valve regurgitation is not visualized. Mild to moderate aortic valve sclerosis/calcification is present, without any evidence of aortic stenosis.  5. Aortic dilatation noted. There  is mild dilatation of the aortic root, measuring 42 mm.  6. The inferior vena cava is dilated in size with >50% respiratory variability, suggesting right atrial pressure of 8 mmHg. FINDINGS  Left Ventricle: Left ventricular ejection fraction, by estimation, is 55 to 60%. The left ventricle has normal function. The left ventricle has no regional wall motion abnormalities. Definity contrast agent was given IV to delineate the left ventricular  endocardial borders. The left ventricular internal cavity size was normal in size. There is mild concentric left ventricular hypertrophy. Left ventricular diastolic function could not be evaluated due to atrial fibrillation. Left ventricular diastolic function could not be evaluated. Right Ventricle: The right ventricular size is normal. No increase in right ventricular wall thickness. Right  ventricular systolic function is normal. Tricuspid regurgitation signal is inadequate for assessing PA pressure. Left Atrium: Left atrial size was normal in size. Right Atrium: Right atrial size was normal in size. Pericardium: Trivial pericardial effusion is present. Presence of pericardial fat pad. Mitral Valve: The mitral valve is grossly normal. Mild to moderate mitral valve regurgitation. No evidence of mitral valve stenosis. Tricuspid Valve: The tricuspid valve is grossly normal. Tricuspid valve regurgitation is trivial. No evidence of tricuspid stenosis. Aortic Valve: The aortic valve is tricuspid. There is moderate calcification of the aortic valve. There is moderate thickening of the aortic valve. Aortic valve regurgitation is not visualized. Mild to moderate aortic valve sclerosis/calcification is present, without any evidence of aortic stenosis. Aortic valve mean gradient measures 7.0 mmHg. Aortic valve peak gradient measures 13.5 mmHg. Aortic valve area, by VTI measures 3.68 cm. Pulmonic Valve: The pulmonic valve was grossly normal. Pulmonic valve regurgitation is not visualized. No evidence of pulmonic stenosis. Aorta: Aortic dilatation noted. There is mild dilatation of the aortic root, measuring 42 mm. Venous: The inferior vena cava is dilated in size with greater than 50% respiratory variability, suggesting right atrial pressure of 8 mmHg. IAS/Shunts: The atrial septum is grossly normal.  LEFT VENTRICLE PLAX 2D LVIDd:         4.60 cm LVIDs:         4.10 cm LV PW:         1.20 cm LV IVS:        1.20 cm LVOT diam:     2.80 cm LV SV:         120 LV SV Index:   48 LVOT Area:     6.16 cm  LV Volumes (MOD) LV vol d, MOD A2C: 62.2 ml LV vol d, MOD A4C: 115.5 ml LV vol s, MOD A2C: 29.0 ml LV vol s, MOD A4C: 64.6 ml LV SV MOD A2C:     33.2 ml LV SV MOD A4C:     115.5 ml LV SV MOD BP:      43.8 ml RIGHT VENTRICLE         IVC TAPSE (M-mode): 1.6 cm  IVC diam: 2.80 cm LEFT ATRIUM             Index       RIGHT  ATRIUM           Index LA diam:        4.40 cm 1.74 cm/m  RA Area:     20.80 cm LA Vol (A2C):   86.5 ml 34.22 ml/m RA Volume:   49.40 ml  19.54 ml/m LA Vol (A4C):   73.5 ml 29.08 ml/m LA Biplane Vol: 81.5 ml 32.24 ml/m  AORTIC VALVE AV Area (Vmax):  3.61 cm AV Area (Vmean):   4.06 cm AV Area (VTI):     3.68 cm AV Vmax:           184.00 cm/s AV Vmean:          119.000 cm/s AV VTI:            0.326 m AV Peak Grad:      13.5 mmHg AV Mean Grad:      7.0 mmHg LVOT Vmax:         108.00 cm/s LVOT Vmean:        78.500 cm/s LVOT VTI:          0.195 m LVOT/AV VTI ratio: 0.60  AORTA Ao Root diam: 4.20 cm Ao Asc diam:  4.10 cm MR PISA:        0.25 cm MR PISA Radius: 0.20 cm  SHUNTS                          Systemic VTI:  0.20 m                          Systemic Diam: 2.80 cm Eleonore Chiquito MD Electronically signed by Eleonore Chiquito MD Signature Date/Time: 01/23/2020/11:06:07 AM    Final        Oswald Hillock   [facility] If 7PM-7AM, please contact night-coverage at [email or web address], Office  [phone]   01/25/2020, 9:10 AM  LOS: 3 days

## 2020-01-26 DIAGNOSIS — J4541 Moderate persistent asthma with (acute) exacerbation: Secondary | ICD-10-CM

## 2020-01-26 MED ORDER — NEBIVOLOL HCL 5 MG PO TABS
5.0000 mg | ORAL_TABLET | Freq: Every day | ORAL | Status: DC
  Administered 2020-01-26 – 2020-01-27 (×2): 5 mg via ORAL
  Filled 2020-01-26 (×2): qty 1

## 2020-01-26 MED ORDER — METHYLPREDNISOLONE SODIUM SUCC 40 MG IJ SOLR
40.0000 mg | Freq: Four times a day (QID) | INTRAMUSCULAR | Status: DC
  Administered 2020-01-26 – 2020-01-27 (×4): 40 mg via INTRAVENOUS
  Filled 2020-01-26 (×4): qty 1

## 2020-01-26 MED ORDER — LEVALBUTEROL HCL 0.63 MG/3ML IN NEBU
0.6300 mg | INHALATION_SOLUTION | Freq: Four times a day (QID) | RESPIRATORY_TRACT | Status: DC
  Administered 2020-01-26 – 2020-01-27 (×5): 0.63 mg via RESPIRATORY_TRACT
  Filled 2020-01-26 (×5): qty 3

## 2020-01-26 MED ORDER — IPRATROPIUM BROMIDE 0.02 % IN SOLN
0.5000 mg | Freq: Four times a day (QID) | RESPIRATORY_TRACT | Status: DC
  Administered 2020-01-26 – 2020-01-27 (×5): 0.5 mg via RESPIRATORY_TRACT
  Filled 2020-01-26 (×5): qty 2.5

## 2020-01-26 NOTE — Plan of Care (Signed)
  Problem: Activity: Goal: Ability to tolerate increased activity will improve Outcome: Progressing   Problem: Cardiac: Goal: Ability to achieve and maintain adequate cardiopulmonary perfusion will improve Outcome: Progressing   Problem: Health Behavior/Discharge Planning: Goal: Ability to safely manage health-related needs after discharge will improve Outcome: Progressing   Problem: Education: Goal: Knowledge of disease or condition will improve Outcome: Completed/Met Goal: Understanding of medication regimen will improve Outcome: Completed/Met

## 2020-01-26 NOTE — Progress Notes (Signed)
Physical Therapy Treatment Patient Details Name: Todd Harrison MRN: [id number] DOB: [birth year] Today's Date: 01/26/2020    History of Present Illness 74 yo male with onset of SOB after recent bout of PNA was brought to ED, with a-fib and O2 sat drops on 2L O2.  Diagnosed with tracheobronchitis, asthma and possible aspirations.  PMHx:  planus lichens, PNA,  hypothyroidism, wheezing, asthma, swallowing difficulty with wgt loss    PT Comments    Pt progressing to perform limited ambulation and standing exercises; pt continues to be limited due to increase in HR during activities; pt able to return to sitting and rest to improve HR; pt continues to demonstrate mild balance deficits during standing activities and limited endurance to participate in session; pt will benefit from skilled PT to address deficits and maximize independence with functional mobility prior to discharge.     Follow Up Recommendations  Home health PT;Supervision for mobility/OOB     Equipment Recommendations  None recommended by PT    Recommendations for Other Services       Precautions / Restrictions Precautions Precautions: Fall;Other (comment) Precaution Comments: watch HR, BP Restrictions Weight Bearing Restrictions: No    Mobility  Bed Mobility                  Transfers Overall transfer level: Needs assistance Equipment used: None Transfers: Sit to/from Stand Sit to Stand: Supervision         General transfer comment: supervision for safety  Ambulation/Gait             General Gait Details: pt seen returned from bathroom with ambulation 8 feet with S; wide BOS and increased lateral weight shift R and L during ambulation   Stairs             Wheelchair Mobility    Modified Rankin (Stroke Patients Only)       Balance Performed standing balance activities with mild balance deficits requiring stand by assist to min guard assist                                           Cognition Arousal/Alertness: Awake/alert Behavior During Therapy: WFL for tasks assessed/performed Overall Cognitive Status: Within Functional Limits for tasks assessed                                 General Comments: pt HR at rest betwewen 106-111, occasional increase to 120, during activities pt increase in HR to 143, returned to 109 with seated rest break      Exercises Total Joint Exercises Hip ABduction/ADduction: AROM;Right;Left;Standing;10 reps Straight Leg Raises: AROM;Right;Left;10 reps;Standing Marching in Standing: AROM;Strengthening;Right;Left;10 reps;Standing Other Exercises Other Exercises: pt stated at beginning of session diffiuclty wtih initial movement after being in bed; PT providing education to pt regarding perform LE exercises while supine and in sitting prior to attempting to get OOB    General Comments General comments (skin integrity, edema, etc.): pt in afib      Pertinent Vitals/Pain Pain Assessment: No/denies pain Faces Pain Scale: No hurt    Home Living                      Prior Function            PT Goals (current goals can now be  found in the care plan section) Acute Rehab PT Goals Patient Stated Goal: to get back to his routine PT Goal Formulation: With patient Time For Goal Achievement: 01/30/20 Potential to Achieve Goals: Good Progress towards PT goals: Progressing toward goals    Frequency    Min 3X/week      PT Plan Current plan remains appropriate    Harrison-evaluation              AM-PAC PT "6 Clicks" Mobility   Outcome Measure  Help needed turning from your back to your side while in a flat bed without using bedrails?: None Help needed moving from lying on your back to sitting on the side of a flat bed without using bedrails?: None Help needed moving to and from a bed to a chair (including a wheelchair)?: None Help needed standing up from a chair using your arms (e.g.,  wheelchair or bedside chair)?: None Help needed to walk in hospital room?: A Little Help needed climbing 3-5 steps with a railing? : A Little 6 Click Score: 22    End of Session Equipment Utilized During Treatment: Gait belt Activity Tolerance: Patient tolerated treatment well;Treatment limited secondary to medical complications increase in HR  Patient left: in bed;with call bell/phone within reach Nurse Communication: Mobility status PT Visit Diagnosis: Unsteadiness on feet (R26.81);Other (comment)     Time: 9629-5284 PT Time Calculation (min) (ACUTE ONLY): 23 min  Charges:  $Therapeutic Exercise: 23-37 mins                     Todd Harrison, DPT Acute Rehabilitation Services [phone]   Todd Harrison 01/26/2020, 10:13 AM

## 2020-01-26 NOTE — Progress Notes (Signed)
Occupational Therapy Treatment Patient Details Name: Todd Harrison MRN: [id number] DOB: September 03, 1945 Today's Date: 01/26/2020    History of present illness 74 yo male with onset of SOB after recent bout of PNA was brought to ED, with a-fib and O2 sat drops on 2L O2.  Diagnosed with tracheobronchitis, asthma and possible aspirations.  PMHx:  planus lichens, PNA,  hypothyroidism, wheezing, asthma, swallowing difficulty with wgt loss   OT comments  Pt able to ambulate @ 120 ft on RA wit SpO2 94; max HR 120; 1/4 DOE. Educated pt on energy conservation strategies. Pt wanting to stay active - recommend pt discuss increasing activity and establishing exercise program with his physician given recent medical changes/medicaiton changes. OT goal met. OT signing off.   Follow Up Recommendations  No OT follow up    Equipment Recommendations  None recommended by OT    Recommendations for Other Services      Precautions / Restrictions Precautions Precautions: None       Mobility Bed Mobility Overal bed mobility: Modified Independent                Transfers Overall transfer level: Modified independent Equipment used: None Transfers: Sit to/from Stand Sit to Stand: Modified independent (Device/Increase time)              Balance Overall balance assessment: No apparent balance deficits (not formally assessed)                                         ADL either performed or assessed with clinical judgement   ADL Overall ADL's : At baseline                                       General ADL Comments: Educated on energy conservation strategies; handout provided and reviewed     Vision       Perception     Praxis      Cognition Arousal/Alertness: Awake/alert Behavior During Therapy: WFL for tasks assessed/performed Overall Cognitive Status: Within Functional Limits for tasks assessed                                           Exercises     Shoulder Instructions       General Comments      Pertinent Vitals/ Pain       Pain Assessment: No/denies pain  Home Living                                          Prior Functioning/Environment              Frequency           Progress Toward Goals  OT Goals(current goals can now be found in the care plan section)  Progress towards OT goals: Goals met/education completed, patient discharged from OT  Acute Rehab OT Goals Patient Stated Goal: to get back to his routine ADL Goals Additional ADL Goal #1: Pt will state at least 3 energy conservation strategies as instructed.  Plan All goals met and education completed, patient discharged  from OT services    Co-evaluation                 AM-PAC OT "6 Clicks" Daily Activity     Outcome Measure   Help from another person eating meals?: None Help from another person taking care of personal grooming?: None Help from another person toileting, which includes using toliet, bedpan, or urinal?: None Help from another person bathing (including washing, rinsing, drying)?: None Help from another person to put on and taking off regular upper body clothing?: None Help from another person to put on and taking off regular lower body clothing?: None 6 Click Score: 24    End of Session    OT Visit Diagnosis: Other abnormalities of gait and mobility (R26.89)   Activity Tolerance Patient tolerated treatment well   Patient Left in bed;with call bell/phone within reach   Nurse Communication Mobility status        Time: 1415-1430 OT Time Calculation (min): 15 min  Charges: OT General Charges $OT Visit: 1 Visit OT Treatments $Self Care/Home Management : 8-22 mins  Maurie Boettcher, OT/L   Acute OT Clinical Specialist [place] Pager [phone] Office [phone]    [facility] 01/26/2020, 2:47 PM

## 2020-01-26 NOTE — Care Management Important Message (Signed)
Important Message  Patient Details  Name: LAYDEN CATERINO MRN: [id number] Date of Birth: 1946/04/22   Medicare Important Message Given:  Yes     Shelda Altes 01/26/2020, 9:22 AM

## 2020-01-26 NOTE — Discharge Instructions (Signed)

## 2020-01-26 NOTE — TOC Transition Note (Addendum)
Transition of Care [facility]) - CM/SW Discharge Note   Patient Details  Name: Todd Harrison MRN: [id number] Date of Birth: 09/13/1945  [facility] CM/SW Contact:  Zenon Mayo, RN Phone Number: 01/26/2020, 5:17 PM   Clinical Narrative:    9/21- NCM spoke with patient and informed him about his eliquis copay for next month of 47.00 per [facility] pharmacy.  They will fill and bring to his room.   9/20- NCM offered choice to patient, he states he really does not have a preference and [facility] is ok with him, NCM made referral to [place] with [facility], she is able to take referral for HHPT.  Soc will begin 24 to 48 hrs post dc.  NCM asked MD for HHPT order.     Final next level of care: [place] Barriers to Discharge: No Barriers Identified   Patient Goals and CMS Choice Patient states their goals for this hospitalization and ongoing recovery are:: to get better CMS Medicare.gov Compare Post Acute Care list provided to:: Patient Choice offered to / list presented to : Patient  Discharge Placement                       Discharge Plan and Services                  DME Agency: NA       HH Arranged: PT HH Agency: [facility] Date [facility] Agency Contacted: 01/26/20 Time [place]: 6967 Representative spoke with at [place]: [place] ([place]) Interventions     Readmission Risk Interventions No flowsheet data found.

## 2020-01-26 NOTE — Progress Notes (Signed)
Progress Note  Patient Name: Todd Harrison Date of Encounter: 01/26/2020  Primary Cardiologist:   Freada Bergeron, MD   Subjective   Still coughing and wheezing.  Still with SOB.   Inpatient Medications    Scheduled Meds: . apixaban  5 mg Oral BID  . diltiazem  300 mg Oral Daily  . doxycycline  100 mg Oral Q12H  . folic acid  1 mg Oral Daily  . guaiFENesin  1,200 mg Oral BID  . hydrOXYzine  25 mg Oral Daily  . ipratropium  2 spray Each Nare TID  . levothyroxine  100 mcg Oral Q24H  . methylPREDNISolone (SOLU-MEDROL) injection  40 mg Intravenous Q12H  . mometasone-formoterol  2 puff Inhalation BID  . montelukast  5 mg Oral QHS  . pantoprazole  40 mg Oral Daily  . triamcinolone cream   Topical BID   Continuous Infusions:  PRN Meds: acetaminophen **OR** acetaminophen, benzonatate, dextromethorphan, levalbuterol, menthol-cetylpyridinium   Vital Signs    Vitals:   01/25/20 2330 01/26/20 0145 01/26/20 0535 01/26/20 0742  BP: (!) 153/93 134/66 134/77   Pulse: (!) 101 98 (!) 58 (!) 120  Resp: 15 16 (!) 21 (!) 22  Temp: 98.1 F (36.7 C) 97.8 F (36.6 C) 97.8 F (36.6 C)   TempSrc: Oral Oral Oral   SpO2: 93% 93% 94%   Weight:  (!) 136.9 kg    Height:        Intake/Output Summary (Last 24 hours) at 01/26/2020 0850 Last data filed at 01/26/2020 0846 Gross per 24 hour  Intake 2650 ml  Output 3100 ml  Net -450 ml   Filed Weights   01/24/20 0015 01/25/20 0524 01/26/20 0145  Weight: (!) 137 kg (!) 136.9 kg (!) 136.9 kg    Telemetry    Atrial fib with RVR - Personally Reviewed  ECG    NA - Personally Reviewed  Physical Exam   GEN: No acute distress.   Neck: No  JVD Cardiac: Irregular RR, no murmurs, rubs, or gallops.  Respiratory:    Wheezing with decreased breath sounds GI: Soft, nontender, non-distended  MS: No edema; No deformity. Neuro:  Nonfocal  Psych: Normal affect   Labs    Chemistry Recent Labs  Lab 01/22/20 1330 01/22/20 1330  01/23/20 0535 01/24/20 0608 01/25/20 1248  NA 137   < > 132* 136 138  K 3.7   < > 4.3 4.8 4.1  CL 106   < > 106 106 103  CO2 20*   < > 19* 17* 22  GLUCOSE 105*   < > 152* 126* 120*  BUN 10   < > 12 18 18   CREATININE 1.26*   < > 1.25* 1.39* 1.06  CALCIUM 8.6*   < > 8.8* 9.4 8.9  PROT 6.0*  --   --   --   --   ALBUMIN 3.2*  --   --   --   --   AST 15  --   --   --   --   ALT 18  --   --   --   --   ALKPHOS 44  --   --   --   --   BILITOT 0.9  --   --   --   --   GFRNONAA 57*   < > 58* 51* >60  GFRAA >60   < > >60 59* >60  ANIONGAP 11   < > 7 13 13    < > =  values in this interval not displayed.     Hematology Recent Labs  Lab 01/22/20 1330 01/23/20 0535  WBC 8.7 5.9  RBC 4.50 4.44  HGB 13.2 12.9*  HCT 41.3 40.6  MCV 91.8 91.4  MCH 29.3 29.1  MCHC 32.0 31.8  RDW 13.7 13.8  PLT 231 212    Cardiac EnzymesNo results for input(s): TROPONINI in the last 168 hours. No results for input(s): TROPIPOC in the last 168 hours.   BNP Recent Labs  Lab 01/22/20 1322  BNP 184.6*     DDimer No results for input(s): DDIMER in the last 168 hours.   Radiology    No results found.  Cardiac Studies    01/2020 echo  1. Left ventricular ejection fraction, by estimation, is 55 to 60%. The  left ventricle has normal function. The left ventricle has no regional  wall motion abnormalities. There is mild concentric left ventricular  hypertrophy. Left ventricular diastolic  function could not be evaluated.  2. Right ventricular systolic function is normal. The right ventricular  size is normal. Tricuspid regurgitation signal is inadequate for assessing  PA pressure.  3. The mitral valve is grossly normal. Mild to moderate mitral valve  regurgitation. No evidence of mitral stenosis.  4. The aortic valve is tricuspid. There is moderate calcification of the  aortic valve. There is moderate thickening of the aortic valve. Aortic  valve regurgitation is not visualized. Mild to  moderate aortic valve  sclerosis/calcification is present,  without any evidence of aortic stenosis.  5. Aortic dilatation noted. There is mild dilatation of the aortic root,  measuring 42 mm.  6. The inferior vena cava is dilated in size with >50% respiratory  variability, suggesting right atrial pressure of 8 mmHg.   Patient Profile     74 y.o. male with a hx of lichen planuswith methotrexate and chronic prednisoneuse,andhypothyroidismwho is being seen for the evaluation of new onset AF with RVRat the request of Dr. Darrick Meigs.  Assessment & Plan    Afib with RVR:  [name] has a CHA2DS2 - VASc score of 2.  On Eliquis.    Plan is possible out patient DCCV and sleep study.   Now on PO long acting Cardizem.   Still with resting heart rates above 100 - 120.  He is still quite wheezy and bronchospastic.  I doubt, with ongoing acute illness, that he would hold sinus with DCCV and he would need TEE which would be more difficult in this situation.  I think attempts at rate control, although this will be imperfect, is preferred.  I will add Bystolic this morning and see how he does with rate control this afternoon.     Mitral regurgitation:  This was mild on echo and we will follow clinically as an outpatient.    Aortic root diltation:  He will likely get a CT angiogram in two years per Dr Johney Frame who can follow as his new cardiologist.  - mild by echo 42 mm, monitor as outpatient.   Bronchitis/Aspiration:  Per primary team.    For questions or updates, please contact [place] Please consult [email or web address] for contact info under Cardiology/STEMI.   Signed, Minus Breeding, MD  01/26/2020, 8:50 AM  '

## 2020-01-26 NOTE — Progress Notes (Addendum)
[facility]  PROGRESS NOTE  Todd Harrison HBZ:[id number] DOB: 1945/07/12 DOA: 01/22/2020 PCP: Maximiano Coss, NP   Brief HPI:   74 year old male with a history of lichen planus, psoriasis, hypothyroidism presents with cough and wheezing. Patient states symptoms started 5 weeks ago he was seen by pulmonologist, was diagnosed with left-sided pneumonia and acute asthma exacerbation for which she was treated with Medrol Dosepak along with Levaquin. Patient only had modest improvement and started having cough again with greenish sputum. He went to pulmonologist office and was found to be in A. fib with RVR with heart rate in 130s. Chest x-ray showed resolving left middle lobe pneumonia. Patient was diagnosed with lichen planus/psoriasis of the feet used to be on methotrexate which was stopped recently given lack of response. And he was started on mycophenolate and Soriatane which was added about 2 weeks ago.   Subjective   Patient seen, continues to have mild wheezing.  Heart rate still not controlled with p.o. Cardizem 300 mg p.o. daily.  Cardiology has added Bystolic for better rate control.   Assessment/Plan:     1. Atrial fibrillation RVR- CHA2DS2VASc score is 1, patient started on aspirin. Echocardiogram showed mild to moderate MR, EF 55 to 60% . TSH is 1.171.  Patient was started on Cardizem gtt. for rate control.  Patient started on oral Cardizem 300 mg long-acting daily.   Cardiology is planning to do outpatient DCCV after 3 weeks of anticoagulation.  Patient started on Eliquis.  Heart rate still controlled, cardiology added Bystolic 5 mg p.o. daily. 2. Acute persistent asthma/bronchitis-no previous history of COPD or asthma. No history of tobacco use. Patient recently had pneumonia which has been treated. Chest x-ray showed resolved pneumonia. Started on IV Solu-Medrol.  Doxycycline 100 mg p.o. twice daily.  Mucinex 1200 milligrams p.o. twice daily.  Will add Xopenex nebulizer  every 6 hours scheduled, ipratropium nebulizer every 6 hours.  Will change Solu-Medrol to 40 mg every 8 hours.  Procalcitonin less than 0.10.  Follow-up QuantiFERON-TB gold test results. Respiratory panel is negative. 3. Hypothyroidism-TSH 1.171. Continue Synthroid 100 mcg daily. 4. Lichen planus/psoriasis-we will hold mycophenolate, patient is already on methylprednisolone as above. Continue Soriatane. 5. GERD-continue Protonix. Patient having intermittent problem with swallowing. Swallow evaluation done.  Patient started on regular diet with thin liquids.   COVID-19 Labs  No results for input(s): DDIMER, FERRITIN, LDH, CRP in the last 72 hours.  Lab Results  Component Value Date   [place] NEGATIVE 01/22/2020   [place] Not Detected 01/01/2020   [place] NEGATIVE 06/09/2019     Scheduled medications:   . apixaban  5 mg Oral BID  . diltiazem  300 mg Oral Daily  . doxycycline  100 mg Oral Q12H  . folic acid  1 mg Oral Daily  . guaiFENesin  1,200 mg Oral BID  . hydrOXYzine  25 mg Oral Daily  . ipratropium  2 spray Each Nare TID  . ipratropium  0.5 mg Nebulization Q6H  . levalbuterol  0.63 mg Nebulization Q6H  . levothyroxine  100 mcg Oral Q24H  . methylPREDNISolone (SOLU-MEDROL) injection  40 mg Intravenous Q6H  . mometasone-formoterol  2 puff Inhalation BID  . montelukast  5 mg Oral QHS  . nebivolol  5 mg Oral Daily  . pantoprazole  40 mg Oral Daily  . triamcinolone cream   Topical BID         CBG: No results for input(s): GLUCAP in the last 168 hours.  SpO2: 94 %  O2 Flow Rate (L/min): 2 L/min    CBC: Recent Labs  Lab 01/22/20 1330 01/23/20 0535  WBC 8.7 5.9  NEUTROABS 6.4  --   HGB 13.2 12.9*  HCT 41.3 40.6  MCV 91.8 91.4  PLT 231 315    Basic Metabolic Panel: Recent Labs  Lab 01/22/20 1330 01/23/20 0535 01/24/20 0608 01/25/20 1248  NA 137 132* 136 138  K 3.7 4.3 4.8 4.1  CL 106 106 106 103  CO2 20* 19* 17* 22  GLUCOSE 105* 152* 126*  120*  BUN 10 12 18 18   CREATININE 1.26* 1.25* 1.39* 1.06  CALCIUM 8.6* 8.8* 9.4 8.9  MG 2.1  --   --   --      Liver Function Tests: Recent Labs  Lab 01/22/20 1330  AST 15  ALT 18  ALKPHOS 44  BILITOT 0.9  PROT 6.0*  ALBUMIN 3.2*     Antibiotics: Anti-infectives (From admission, onward)   Start     Dose/Rate Route Frequency Ordered Stop   01/23/20 2200  doxycycline (VIBRA-TABS) tablet 100 mg        100 mg Oral Every 12 hours 01/23/20 1712 01/27/20 2359       DVT prophylaxis: Lovenox  Code Status: Full code  Family Communication: No family at bedside    Status is: Inpatient  Dispo: The patient is from: Home              Anticipated d/c is to: Home              Anticipated d/c date is: 01/27/2020              Patient currently not medically stable for discharge  Barrier to discharge-atrial fibrillation, started on p.o. Cardizem.  Monitor for 1 day likely discharge in a.m.     Consultants:  Cardiology  Procedures:     Objective   Vitals:   01/26/20 0145 01/26/20 0535 01/26/20 0742 01/26/20 1044  BP: 134/66 134/77  135/70  Pulse: 98 (!) 58 (!) 120 99  Resp: 16 (!) 21 (!) 22 20  Temp: 97.8 F (36.6 C) 97.8 F (36.6 C)  98.3 F (36.8 C)  TempSrc: Oral Oral  Oral  SpO2: 93% 94%    Weight: (!) 136.9 kg     Height:        Intake/Output Summary (Last 24 hours) at 01/26/2020 1336 Last data filed at 01/26/2020 1324 Gross per 24 hour  Intake 2410 ml  Output 2300 ml  Net 110 ml    09/18 1901 - 09/20 0700 In: 2160 [P.O.:2160] Out: 2525 [Urine:2525]  Filed Weights   01/24/20 0015 01/25/20 0524 01/26/20 0145  Weight: (!) 137 kg (!) 136.9 kg (!) 136.9 kg    Physical Examination:   General-appears in no acute distress Heart-S1-S2, regular, no murmur auscultated Lungs-bilateral wheezing auscultated Abdomen-soft, nontender, no organomegaly Extremities-no edema in the lower extremities Neuro-alert, oriented x3, no focal deficit noted  Data  Reviewed:   Recent Results (from the past 240 hour(s))  SARS Coronavirus 2 by RT PCR (hospital order, performed in [place] hospital lab) Nasopharyngeal Nasopharyngeal Swab     Status: None   Collection Time: 01/22/20  1:58 PM   Specimen: Nasopharyngeal Swab  Result Value Ref Range Status   SARS Coronavirus 2 NEGATIVE NEGATIVE Final    Comment: (NOTE) SARS-CoV-2 target nucleic acids are NOT DETECTED.  The SARS-CoV-2 RNA is generally detectable in upper and lower respiratory specimens during the acute phase of  infection. The lowest concentration of SARS-CoV-2 viral copies this assay can detect is 250 copies / mL. A negative result does not preclude SARS-CoV-2 infection and should not be used as the sole basis for treatment or other patient management decisions.  A negative result may occur with improper specimen collection / handling, submission of specimen other than nasopharyngeal swab, presence of viral mutation(s) within the areas targeted by this assay, and inadequate number of viral copies (<250 copies / mL). A negative result must be combined with clinical observations, patient history, and epidemiological information.  Fact Sheet for Patients:   StrictlyIdeas.no  Fact Sheet for Healthcare Providers: BankingDealers.co.za  This test is not yet approved or  cleared by the [place] FDA and has been authorized for detection and/or diagnosis of SARS-CoV-2 by FDA under an Emergency Use Authorization (EUA).  This EUA will remain in effect (meaning this test can be used) for the duration of the COVID-19 declaration under Section 564(b)(1) of the Act, 21 U.S.C. section 360bbb-3(b)(1), unless the authorization is terminated or revoked sooner.  Performed at [facility], [place] [address] [place], [place] [address]   Culture, blood (routine x 2)     Status: None (Preliminary result)   Collection Time: 01/22/20  1:59 PM    Specimen: BLOOD LEFT HAND  Result Value Ref Range Status   Specimen Description BLOOD LEFT HAND  Final   Special Requests   Final    BOTTLES DRAWN AEROBIC AND ANAEROBIC Blood Culture adequate volume   Culture   Final    NO GROWTH 3 DAYS Performed at [facility], [place] [address] [place], [place] [address]    Report Status PENDING  Incomplete  Culture, blood (routine x 2)     Status: None (Preliminary result)   Collection Time: 01/22/20  1:59 PM   Specimen: BLOOD  Result Value Ref Range Status   Specimen Description BLOOD LEFT ANTECUBITAL  Final   Special Requests   Final    BOTTLES DRAWN AEROBIC AND ANAEROBIC Blood Culture adequate volume   Culture   Final    NO GROWTH 3 DAYS Performed at [facility], [place] [address] [place], [place] [address]    Report Status PENDING  Incomplete  Expectorated sputum assessment w rflx to resp cult     Status: None   Collection Time: 01/22/20  7:42 PM   Specimen: Expectorated Sputum  Result Value Ref Range Status   Specimen Description EXPECTORATED SPUTUM  Final   Special Requests NONE  Final   Sputum evaluation   Final    THIS SPECIMEN IS ACCEPTABLE FOR SPUTUM CULTURE Performed at [facility], [place] [address], [place], [place] [address]    Report Status 01/22/2020 FINAL  Final  Culture, respiratory     Status: None   Collection Time: 01/22/20  7:42 PM  Result Value Ref Range Status   Specimen Description EXPECTORATED SPUTUM  Final   Special Requests NONE Reflexed from [id number]  Final   Gram Stain   Final    MODERATE WBC PRESENT, PREDOMINANTLY PMN ABUNDANT GRAM POSITIVE COCCI FEW GRAM VARIABLE ROD    Culture   Final    FEW Normal respiratory flora-no Staph aureus or Pseudomonas seen Performed at [facility], [place] [address] [place], [place] [address]    Report Status 01/25/2020 FINAL  Final  Respiratory Panel by PCR     Status: None   Collection Time: 01/22/20 10:29 PM  Specimen: Nasopharyngeal Swab;  Respiratory  Result Value Ref Range Status   Adenovirus NOT DETECTED NOT DETECTED Final   Coronavirus 229E NOT DETECTED NOT DETECTED Final    Comment: (NOTE) The Coronavirus on the Respiratory Panel, DOES NOT test for the novel  Coronavirus (2019 nCoV)    Coronavirus HKU1 NOT DETECTED NOT DETECTED Final   Coronavirus NL63 NOT DETECTED NOT DETECTED Final   Coronavirus OC43 NOT DETECTED NOT DETECTED Final   Metapneumovirus NOT DETECTED NOT DETECTED Final   Rhinovirus / Enterovirus NOT DETECTED NOT DETECTED Final   Influenza A NOT DETECTED NOT DETECTED Final   Influenza B NOT DETECTED NOT DETECTED Final   Parainfluenza Virus 1 NOT DETECTED NOT DETECTED Final   Parainfluenza Virus 2 NOT DETECTED NOT DETECTED Final   Parainfluenza Virus 3 NOT DETECTED NOT DETECTED Final   Parainfluenza Virus 4 NOT DETECTED NOT DETECTED Final   Respiratory Syncytial Virus NOT DETECTED NOT DETECTED Final   Bordetella pertussis NOT DETECTED NOT DETECTED Final   Chlamydophila pneumoniae NOT DETECTED NOT DETECTED Final   Mycoplasma pneumoniae NOT DETECTED NOT DETECTED Final    Comment: Performed at [facility], [place]. [address], [place], [place] [address]     BNP (last 3 results) Recent Labs    01/22/20 1322  BNP 184.6*    [place]   [facility] If 7PM-7AM, please contact night-coverage at [email or web address], Office  [phone]   01/26/2020, 1:36 PM  LOS: 4 days

## 2020-01-27 DIAGNOSIS — J209 Acute bronchitis, unspecified: Secondary | ICD-10-CM

## 2020-01-27 LAB — CULTURE, BLOOD (ROUTINE X 2)
Culture: NO GROWTH
Culture: NO GROWTH
Special Requests: ADEQUATE
Special Requests: ADEQUATE

## 2020-01-27 LAB — ASPERGILLUS ANTIBODY BY IMMUNODIFF
Aspergillus flavus: NEGATIVE
Aspergillus fumigatus, IgG: NEGATIVE
Aspergillus niger: NEGATIVE

## 2020-01-27 MED ORDER — APIXABAN 5 MG PO TABS
5.0000 mg | ORAL_TABLET | Freq: Two times a day (BID) | ORAL | 2 refills | Status: DC
Start: 2020-01-27 — End: 2020-02-24

## 2020-01-27 MED ORDER — DILTIAZEM HCL ER COATED BEADS 300 MG PO CP24: 300.0000 mg | ORAL_CAPSULE | Freq: Every day | ORAL | 2 refills | Status: DC

## 2020-01-27 MED ORDER — GUAIFENESIN ER 600 MG PO TB12
1200.0000 mg | ORAL_TABLET | Freq: Two times a day (BID) | ORAL | 0 refills | Status: DC | PRN
Start: 2020-01-27 — End: 2020-04-12

## 2020-01-27 MED ORDER — NEBIVOLOL HCL 10 MG PO TABS
10.0000 mg | ORAL_TABLET | Freq: Every day | ORAL | 2 refills | Status: DC
Start: 2020-01-28 — End: 2020-02-24

## 2020-01-27 MED ORDER — NEBIVOLOL HCL 10 MG PO TABS: 10.0000 mg | ORAL_TABLET | Freq: Every day | ORAL | Status: DC

## 2020-01-27 MED ORDER — PREDNISONE 10 MG PO TABS: 10.0000 mg | ORAL_TABLET | Freq: Every day | ORAL | 0 refills | Status: DC

## 2020-01-27 MED FILL — ELIQUIS 5 MG TABLET: 5 | 30 days supply | Qty: 60 | Fill #0

## 2020-01-27 MED FILL — MUCUS RELIEF 600 MG TB12: 600 | 5 days supply | Qty: 20 | Fill #0

## 2020-01-27 MED FILL — predniSONE 10 MG TABS: 10 | 4 days supply | Qty: 10 | Fill #0

## 2020-01-27 MED FILL — CARTIA XT 300 MG CAPSULE SA: 300 | 60 days supply | Qty: 60 | Fill #0

## 2020-01-27 MED FILL — BYSTOLIC 10 MG TABLET: 10 | 30 days supply | Qty: 30 | Fill #0

## 2020-01-27 NOTE — Progress Notes (Signed)
D/C instructions given and reviewed. No questions voiced but encouraged to call with any concerns. Tele and IV removed, tolerated well.

## 2020-01-27 NOTE — Discharge Summary (Signed)
Physician Discharge Summary  Todd Harrison MEQ:[id number] DOB: [birth year] DOA: 01/22/2020  PCP: Maximiano Coss, NP  Admit date: 01/22/2020 Discharge date: 01/27/2020  Time spent: 50 minutes  Recommendations for Outpatient Follow-up:  1. Follow-up cardiology as outpatient   Discharge Diagnoses:  Active Problems:   Atrial fibrillation with RVR [facility] 2)   Discharge Condition: Stable  Diet recommendation: Heart healthy diet  Filed Weights   01/25/20 0524 01/26/20 0145 01/27/20 0315  Weight: (!) 136.9 kg (!) 136.9 kg 133.4 kg    History of present illness:  74 year old male with a history of lichen planus, psoriasis, hypothyroidism presents with cough and wheezing. Patient states symptoms started 5 weeks ago he was seen by pulmonologist, was diagnosed with left-sided pneumonia and acute asthma exacerbation for which she was treated with Medrol Dosepak along with Levaquin. Patient only had modest improvement and started having cough again with greenish sputum. He went to pulmonologist office and was found to be in A. fib with RVR with heart rate in 130s. Chest x-ray showed resolving left middle lobe pneumonia. Patient was diagnosed with lichen planus/psoriasis of the feet used to be on methotrexate which was stopped recently given lack of response. And he was started on mycophenolate and Soriatane which was added about 2 weeks ago.  Hospital Course:   1. Atrial fibrillation RVR- CHA2DS2VASc score is 1, patient started on aspirin. Echocardiogram showed mild to moderate MR, EF 55 to 60% . TSH is 1.171.  Patient was started on Cardizem gtt. for rate control.  Patient started on oral Cardizem 300 mg long-acting daily.   Cardiology is planning to do outpatient DCCV after 3 weeks of anticoagulation.  Patient started on Eliquis.  Heart rate still controlled, cardiology added Bystolic 5 mg p.o. daily. Dose of Bystolic has been increased to 10 mg daily per cardiology. 2. Acute persistent  asthma/bronchitis-significantly improved, no previous history of COPD. No history of tobacco use. Patient recently had pneumonia which has been treated. Chest x-ray showed resolved pneumonia. Started on IV Solu-Medrol.  Doxycycline 100 mg p.o. twice daily.  Mucinex 1200 milligrams p.o. twice daily. Patient was started on Xopenex nebulizer every 6 hours scheduled, ipratropium nebulizer every 6 hours.   Solu-Medrol to 40 mg every 8 hours.  Procalcitonin less than 0.10.  Follow-up QuantiFERON-TB gold test was negative. Respiratory panel is negative. Will discharge on prednisone taper for 5 days. Will discontinue doxycycline. Continue Symbicort. As needed albuterol. 3. Hypothyroidism-TSH 1.171. Continue Synthroid 100 mcg daily. 4. Lichen planus/psoriasis-continue mycophenolate, Soriatane. 5. GERD-continue Protonix. Patient having intermittent problem with swallowing. Swallow evaluation done.  Patient started on regular diet with thin liquids.  Aortic root diltation:  He will likely get a CT angiogram in two years per Dr Johney Frame.     Procedures:    Consultations: Cardiology Discharge Exam: Vitals:   01/27/20 0923 01/27/20 1125  BP:  135/76  Pulse:    Resp:  17  Temp: 98.3 F (36.8 C) 97.9 F (36.6 C)  SpO2:      General: Appears in no acute distress Cardiovascular: S1-S2, regular Respiratory: Clear to auscultation bilaterally  Discharge Instructions   Discharge Instructions    Diet - low sodium heart healthy   Complete by: As directed    Increase activity slowly   Complete by: As directed      Allergies as of 01/27/2020      Reactions   Gold Itching   Mixed Grasses    unknown      Medication List  STOP taking these medications   Advair Diskus 100-50 MCG/DOSE Aepb Generic drug: Fluticasone-Salmeterol   benzonatate 100 MG capsule Commonly known as: TESSALON   dextromethorphan 15 MG/5ML syrup   ipratropium 0.06 % nasal spray Commonly known as: ATROVENT    levofloxacin 750 MG tablet Commonly known as: Levaquin     TAKE these medications   albuterol 108 (90 Base) MCG/ACT inhaler Commonly known as: VENTOLIN HFA Inhale 2 puffs into the lungs every 6 (six) hours as needed for wheezing or shortness of breath. What changed: Another medication with the same name was removed. Continue taking this medication, and follow the directions you see here.   apixaban 5 MG Tabs tablet Commonly known as: ELIQUIS Take 1 tablet (5 mg total) by mouth 2 (two) times daily.   betamethasone dipropionate 0.05 % ointment Commonly known as: DIPROLENE Apply 1 application topically 2 (two) times daily.   budesonide-formoterol 160-4.5 MCG/ACT inhaler Commonly known as: Symbicort Inhale 2 puffs into the lungs 2 (two) times daily.   diltiazem 300 MG 24 hr capsule Commonly known as: CARDIZEM CD Take 1 capsule (300 mg total) by mouth daily. Start taking on: January 28, 2020   guaiFENesin 600 MG 12 hr tablet Commonly known as: MUCINEX Take 2 tablets (1,200 mg total) by mouth 2 (two) times daily as needed.   ISOtretinoin 40 MG capsule Commonly known as: ACCUTANE Take 40 mg by mouth daily.   levothyroxine 100 MCG tablet Commonly known as: SYNTHROID Take 100 mcg by mouth daily.   loratadine 10 MG tablet Commonly known as: CLARITIN Take 10 mg by mouth daily.   meclizine 25 MG tablet Commonly known as: ANTIVERT Take 25 mg by mouth 2 (two) times daily.   montelukast 10 MG tablet Commonly known as: SINGULAIR TAKE 1 TABLET BY MOUTH EVERYDAY AT BEDTIME   mycophenolate 500 MG tablet Commonly known as: CELLCEPT Take 1,500 mg by mouth See admin instructions. Take 3 tablets by mouth  in the morning and three tablets by mouth at night   nebivolol 10 MG tablet Commonly known as: BYSTOLIC Take 1 tablet (10 mg total) by mouth daily. Start taking on: January 28, 2020   omeprazole 40 MG capsule Commonly known as: PRILOSEC Take 1 capsule (40 mg total) by  mouth daily.   predniSONE 10 MG tablet Commonly known as: DELTASONE Take 1 tablet (10 mg total) by mouth daily. Prednisone 40 mg po daily x 1 day then Prednisone 30 mg po daily x 1 day then Prednisone 20 mg po daily x 1 day then Prednisone 10 mg daily x 1 day then stop...   VITAMIN B COMPLEX PO Take 1 tablet by mouth daily.      Allergies  Allergen Reactions  . Gold Itching  . Mixed Grasses     unknown    [facility] Follow up.   Specialty: Home Health Services Why: HHPT Contact information: [place] [id number] [phone]                The results of significant diagnostics from this hospitalization (including imaging, microbiology, ancillary and laboratory) are listed below for reference.    Significant Diagnostic Studies: DG Chest 2 View  Result Date: 01/01/2020 CLINICAL DATA:  Cough EXAM: CHEST - 2 VIEW COMPARISON:  October 17, 2019 FINDINGS: The cardiomediastinal silhouette is unchanged in contour.Tortuous thoracic aorta. No pleural effusion. No pneumothorax. Coarse reticular opacities bilaterally. Are more focal  reticulonodular opacities within the LEFT mid lower lobe, new since prior. Visualized abdomen is unremarkable. Multilevel degenerative changes of the thoracic spine. Unchanged wedging of a vertebral body of the inferior thoracic spine. IMPRESSION: 1. New reticulonodular opacities within the LEFT mid lower lobe, suspicious for infection. Recommend follow-up radiograph in 4-6 weeks to assess for resolution. 2. Diffuse interstitial prominence may reflect underlying pulmonary fibrosis or emphysema. Electronically Signed   By: Valentino Saxon MD   On: 01/01/2020 13:59   CT Chest Wo Contrast  Result Date: 01/22/2020 CLINICAL DATA:  Persistent cough EXAM: CT CHEST WITHOUT CONTRAST TECHNIQUE: Multidetector CT imaging of the chest was performed following the standard protocol without IV  contrast. COMPARISON:  Radiograph same day FINDINGS: Cardiovascular: There are no significant vascular findings. Scattered vascular calcifications are seen within the aorta. Coronary artery calcifications are seen. The heart size is normal. There is no pericardial thickening or effusion. Mediastinum/Nodes: There are no enlarged mediastinal, hilar or axillary lymph nodes. The thyroid gland, trachea and esophagus demonstrate no significant findings. Lungs/Pleura: Rounded patchy airspace opacity is seen within the posterior left lung base with mild bronchial wall thickening. There is small tree-in-bud opacities also noted within the right lung base. Upper abdomen: The visualized portion of the upper abdomen is unremarkable. Musculoskeletal/Chest wall: There is no chest wall mass or suspicious osseous finding. No acute osseous abnormality IMPRESSION: Patchy airspace opacity with tree-in-bud opacities at the posterior left lung base which could be due to resolving infectious etiology from August 2021 or inflammatory process. Electronically Signed   By: Prudencio Pair M.D.   On: 01/22/2020 17:56   DG Chest Portable 1 View  Result Date: 01/22/2020 CLINICAL DATA:  Cough for weeks EXAM: PORTABLE CHEST 1 VIEW COMPARISON:  01/01/2020 FINDINGS: Normal heart size, mediastinal contours, and pulmonary vascularity. Lungs clear. Resolution of LEFT mid lung infiltrates since previous exam. No pleural effusion or pneumothorax. Osseous structures unremarkable. IMPRESSION: Resolution of previously identified pneumonia in LEFT mid lung. No new abnormalities. Electronically Signed   By: Lavonia Dana M.D.   On: 01/22/2020 13:50   ECHOCARDIOGRAM COMPLETE  Result Date: 01/23/2020    ECHOCARDIOGRAM REPORT   Patient Name:   Todd Harrison Date of Exam: 01/23/2020 Medical Rec #:  [id number]       Height:       72.0 in Accession #:    [id number]      Weight:       299.0 lb Date of Birth:  1946/01/05      BSA:          2.528 m Patient Age:     38 years        BP:           115/77 mmHg Patient Gender: M               HR:           110 bpm. Exam Location:  Inpatient Procedure: 2D Echo, Cardiac Doppler, Color Doppler and Intracardiac            Opacification Agent Indications:    R06.02 SOB; I48.91* Unspeicified atrial fibrillation  History:        Patient has no prior history of Echocardiogram examinations.                 Abnormal ECG, Arrythmias:Atrial Fibrillation;                 Signs/Symptoms:Dyspnea. Pneumonia.  Sonographer:    Roseanna Rainbow  RDCS Referring Phys: [id number] Lequita Halt  Sonographer Comments: Technically difficult study due to poor echo windows. Image acquisition challenging due to respiratory motion. Imaging difficult due to respiratory difficulty. IMPRESSIONS  1. Left ventricular ejection fraction, by estimation, is 55 to 60%. The left ventricle has normal function. The left ventricle has no regional wall motion abnormalities. There is mild concentric left ventricular hypertrophy. Left ventricular diastolic function could not be evaluated.  2. Right ventricular systolic function is normal. The right ventricular size is normal. Tricuspid regurgitation signal is inadequate for assessing PA pressure.  3. The mitral valve is grossly normal. Mild to moderate mitral valve regurgitation. No evidence of mitral stenosis.  4. The aortic valve is tricuspid. There is moderate calcification of the aortic valve. There is moderate thickening of the aortic valve. Aortic valve regurgitation is not visualized. Mild to moderate aortic valve sclerosis/calcification is present, without any evidence of aortic stenosis.  5. Aortic dilatation noted. There is mild dilatation of the aortic root, measuring 42 mm.  6. The inferior vena cava is dilated in size with >50% respiratory variability, suggesting right atrial pressure of 8 mmHg. FINDINGS  Left Ventricle: Left ventricular ejection fraction, by estimation, is 55 to 60%. The left ventricle has normal function.  The left ventricle has no regional wall motion abnormalities. Definity contrast agent was given IV to delineate the left ventricular  endocardial borders. The left ventricular internal cavity size was normal in size. There is mild concentric left ventricular hypertrophy. Left ventricular diastolic function could not be evaluated due to atrial fibrillation. Left ventricular diastolic function could not be evaluated. Right Ventricle: The right ventricular size is normal. No increase in right ventricular wall thickness. Right ventricular systolic function is normal. Tricuspid regurgitation signal is inadequate for assessing PA pressure. Left Atrium: Left atrial size was normal in size. Right Atrium: Right atrial size was normal in size. Pericardium: Trivial pericardial effusion is present. Presence of pericardial fat pad. Mitral Valve: The mitral valve is grossly normal. Mild to moderate mitral valve regurgitation. No evidence of mitral valve stenosis. Tricuspid Valve: The tricuspid valve is grossly normal. Tricuspid valve regurgitation is trivial. No evidence of tricuspid stenosis. Aortic Valve: The aortic valve is tricuspid. There is moderate calcification of the aortic valve. There is moderate thickening of the aortic valve. Aortic valve regurgitation is not visualized. Mild to moderate aortic valve sclerosis/calcification is present, without any evidence of aortic stenosis. Aortic valve mean gradient measures 7.0 mmHg. Aortic valve peak gradient measures 13.5 mmHg. Aortic valve area, by VTI measures 3.68 cm. Pulmonic Valve: The pulmonic valve was grossly normal. Pulmonic valve regurgitation is not visualized. No evidence of pulmonic stenosis. Aorta: Aortic dilatation noted. There is mild dilatation of the aortic root, measuring 42 mm. Venous: The inferior vena cava is dilated in size with greater than 50% respiratory variability, suggesting right atrial pressure of 8 mmHg. IAS/Shunts: The atrial septum is grossly  normal.  LEFT VENTRICLE PLAX 2D LVIDd:         4.60 cm LVIDs:         4.10 cm LV PW:         1.20 cm LV IVS:        1.20 cm LVOT diam:     2.80 cm LV SV:         120 LV SV Index:   48 LVOT Area:     6.16 cm  LV Volumes (MOD) LV vol d, MOD A2C: 62.2 ml LV vol d,  MOD A4C: 115.5 ml LV vol s, MOD A2C: 29.0 ml LV vol s, MOD A4C: 64.6 ml LV SV MOD A2C:     33.2 ml LV SV MOD A4C:     115.5 ml LV SV MOD BP:      43.8 ml RIGHT VENTRICLE         IVC TAPSE (M-mode): 1.6 cm  IVC diam: 2.80 cm LEFT ATRIUM             Index       RIGHT ATRIUM           Index LA diam:        4.40 cm 1.74 cm/m  RA Area:     20.80 cm LA Vol (A2C):   86.5 ml 34.22 ml/m RA Volume:   49.40 ml  19.54 ml/m LA Vol (A4C):   73.5 ml 29.08 ml/m LA Biplane Vol: 81.5 ml 32.24 ml/m  AORTIC VALVE AV Area (Vmax):    3.61 cm AV Area (Vmean):   4.06 cm AV Area (VTI):     3.68 cm AV Vmax:           184.00 cm/s AV Vmean:          119.000 cm/s AV VTI:            0.326 m AV Peak Grad:      13.5 mmHg AV Mean Grad:      7.0 mmHg LVOT Vmax:         108.00 cm/s LVOT Vmean:        78.500 cm/s LVOT VTI:          0.195 m LVOT/AV VTI ratio: 0.60  AORTA Ao Root diam: 4.20 cm Ao Asc diam:  4.10 cm MR PISA:        0.25 cm MR PISA Radius: 0.20 cm  SHUNTS                          Systemic VTI:  0.20 m                          Systemic Diam: 2.80 cm Eleonore Chiquito MD Electronically signed by Eleonore Chiquito MD Signature Date/Time: 01/23/2020/11:06:07 AM    Final     Microbiology: Recent Results (from the past 240 hour(s))  SARS Coronavirus 2 by RT PCR (hospital order, performed in [place] hospital lab) Nasopharyngeal Nasopharyngeal Swab     Status: None   Collection Time: 01/22/20  1:58 PM   Specimen: Nasopharyngeal Swab  Result Value Ref Range Status   SARS Coronavirus 2 NEGATIVE NEGATIVE Final    Comment: (NOTE) SARS-CoV-2 target nucleic acids are NOT DETECTED.  The SARS-CoV-2 RNA is generally detectable in upper and lower respiratory specimens during the  acute phase of infection. The lowest concentration of SARS-CoV-2 viral copies this assay can detect is 250 copies / mL. A negative result does not preclude SARS-CoV-2 infection and should not be used as the sole basis for treatment or other patient management decisions.  A negative result may occur with improper specimen collection / handling, submission of specimen other than nasopharyngeal swab, presence of viral mutation(s) within the areas targeted by this assay, and inadequate number of viral copies (<250 copies / mL). A negative result must be combined with clinical observations, patient history, and epidemiological information.  Fact Sheet for Patients:   StrictlyIdeas.no  Fact Sheet for Healthcare Providers: BankingDealers.co.za  This test is not yet approved or  cleared by the [place] and has been authorized for detection and/or diagnosis of SARS-CoV-2 by FDA under an Emergency Use Authorization (EUA).  This EUA will remain in effect (meaning this test can be used) for the duration of the COVID-19 declaration under Section 564(b)(1) of the Act, 21 U.S.C. section 360bbb-3(b)(1), unless the authorization is terminated or revoked sooner.  Performed at [facility], [place] [address], [place], [place] [address]   Culture, blood (routine x 2)     Status: None (Preliminary result)   Collection Time: 01/22/20  1:59 PM   Specimen: BLOOD LEFT HAND  Result Value Ref Range Status   Specimen Description BLOOD LEFT HAND  Final   Special Requests   Final    BOTTLES DRAWN AEROBIC AND ANAEROBIC Blood Culture adequate volume   Culture   Final    NO GROWTH 4 DAYS Performed at [facility], [place] [address] [place], [place] [address]    Report Status PENDING  Incomplete  Culture, blood (routine x 2)     Status: None (Preliminary result)   Collection Time: 01/22/20  1:59 PM   Specimen: BLOOD  Result Value Ref Range Status    Specimen Description BLOOD LEFT ANTECUBITAL  Final   Special Requests   Final    BOTTLES DRAWN AEROBIC AND ANAEROBIC Blood Culture adequate volume   Culture   Final    NO GROWTH 4 DAYS Performed at [facility], [place] [address], [place], [place] [address]    Report Status PENDING  Incomplete  Expectorated sputum assessment w rflx to resp cult     Status: None   Collection Time: 01/22/20  7:42 PM   Specimen: Expectorated Sputum  Result Value Ref Range Status   Specimen Description EXPECTORATED SPUTUM  Final   Special Requests NONE  Final   Sputum evaluation   Final    THIS SPECIMEN IS ACCEPTABLE FOR SPUTUM CULTURE Performed at [facility], [place] [address] [place], [place] [address]    Report Status 01/22/2020 FINAL  Final  Culture, respiratory     Status: None   Collection Time: 01/22/20  7:42 PM  Result Value Ref Range Status   Specimen Description EXPECTORATED SPUTUM  Final   Special Requests NONE Reflexed from [id number]  Final   Gram Stain   Final    MODERATE WBC PRESENT, PREDOMINANTLY PMN ABUNDANT GRAM POSITIVE COCCI FEW GRAM VARIABLE ROD    Culture   Final    FEW Normal respiratory flora-no Staph aureus or Pseudomonas seen Performed at [facility], [place] [address], [place], [place] [address]    Report Status 01/25/2020 FINAL  Final  Respiratory Panel by PCR     Status: None   Collection Time: 01/22/20 10:29 PM   Specimen: Nasopharyngeal Swab; Respiratory  Result Value Ref Range Status   Adenovirus NOT DETECTED NOT DETECTED Final   Coronavirus 229E NOT DETECTED NOT DETECTED Final    Comment: (NOTE) The Coronavirus on the Respiratory Panel, DOES NOT test for the novel  Coronavirus (2019 nCoV)    Coronavirus HKU1 NOT DETECTED NOT DETECTED Final   Coronavirus NL63 NOT DETECTED NOT DETECTED Final   Coronavirus OC43 NOT DETECTED NOT DETECTED Final   Metapneumovirus NOT DETECTED NOT DETECTED Final   Rhinovirus / Enterovirus NOT DETECTED NOT DETECTED  Final   Influenza A NOT DETECTED NOT DETECTED Final   Influenza B NOT DETECTED NOT  DETECTED Final   Parainfluenza Virus 1 NOT DETECTED NOT DETECTED Final   Parainfluenza Virus 2 NOT DETECTED NOT DETECTED Final   Parainfluenza Virus 3 NOT DETECTED NOT DETECTED Final   Parainfluenza Virus 4 NOT DETECTED NOT DETECTED Final   Respiratory Syncytial Virus NOT DETECTED NOT DETECTED Final   Bordetella pertussis NOT DETECTED NOT DETECTED Final   Chlamydophila pneumoniae NOT DETECTED NOT DETECTED Final   Mycoplasma pneumoniae NOT DETECTED NOT DETECTED Final    Comment: Performed at [facility], [place] [address] [place], [place] [address]     Labs: Basic Metabolic Panel: Recent Labs  Lab 01/22/20 1330 01/23/20 0535 01/24/20 0608 01/25/20 1248  NA 137 132* 136 138  K 3.7 4.3 4.8 4.1  CL 106 106 106 103  CO2 20* 19* 17* 22  GLUCOSE 105* 152* 126* 120*  BUN 10 12 18 18   CREATININE 1.26* 1.25* 1.39* 1.06  CALCIUM 8.6* 8.8* 9.4 8.9  MG 2.1  --   --   --    Liver Function Tests: Recent Labs  Lab 01/22/20 1330  AST 15  ALT 18  ALKPHOS 44  BILITOT 0.9  PROT 6.0*  ALBUMIN 3.2*   No results for input(s): LIPASE, AMYLASE in the last 168 hours. No results for input(s): AMMONIA in the last 168 hours. CBC: Recent Labs  Lab 01/22/20 1330 01/23/20 0535  WBC 8.7 5.9  NEUTROABS 6.4  --   HGB 13.2 12.9*  HCT 41.3 40.6  MCV 91.8 91.4  PLT 231 212   Cardiac Enzymes: No results for input(s): CKTOTAL, CKMB, CKMBINDEX, TROPONINI in the last 168 hours. BNP: BNP (last 3 results) Recent Labs    01/22/20 1322  BNP 184.6*        Signed:  Oswald Hillock MD.  [facility] 01/27/2020, 11:45 AM

## 2020-01-27 NOTE — Progress Notes (Signed)
Home meds returned to pt but pt did not want to open bags and check meds/count.

## 2020-01-27 NOTE — Progress Notes (Signed)
Progress Note  Patient Name: Todd Harrison Date of Encounter: 01/27/2020  Primary Cardiologist:   Freada Bergeron, MD   Subjective   Breathing better.  Less cough but still present  Inpatient Medications    Scheduled Meds: . apixaban  5 mg Oral BID  . diltiazem  300 mg Oral Daily  . doxycycline  100 mg Oral Q12H  . folic acid  1 mg Oral Daily  . guaiFENesin  1,200 mg Oral BID  . hydrOXYzine  25 mg Oral Daily  . ipratropium  2 spray Each Nare TID  . ipratropium  0.5 mg Nebulization Q6H  . levalbuterol  0.63 mg Nebulization Q6H  . levothyroxine  100 mcg Oral Q24H  . methylPREDNISolone (SOLU-MEDROL) injection  40 mg Intravenous Q6H  . mometasone-formoterol  2 puff Inhalation BID  . montelukast  5 mg Oral QHS  . nebivolol  5 mg Oral Daily  . pantoprazole  40 mg Oral Daily  . triamcinolone cream   Topical BID   Continuous Infusions:  PRN Meds: acetaminophen **OR** acetaminophen, benzonatate, dextromethorphan, menthol-cetylpyridinium   Vital Signs    Vitals:   01/26/20 2045 01/27/20 0000 01/27/20 0138 01/27/20 0315  BP: (!) 140/97 (!) 128/92  139/89  Pulse: (!) 107  100 (!) 109  Resp: 20 14 (!) 21 20  Temp: 98.2 F (36.8 C) 98 F (36.7 C)  97.9 F (36.6 C)  TempSrc: Oral Oral  Oral  SpO2: 91% 92% 99% 94%  Weight:    133.4 kg  Height:        Intake/Output Summary (Last 24 hours) at 01/27/2020 0814 Last data filed at 01/27/2020 0600 Gross per 24 hour  Intake 1200 ml  Output 1200 ml  Net 0 ml   Filed Weights   01/25/20 0524 01/26/20 0145 01/27/20 0315  Weight: (!) 136.9 kg (!) 136.9 kg 133.4 kg    Telemetry    Atrial fib- Personally Reviewed  ECG    NA - Personally Reviewed  Physical Exam   GEN: No  acute distress.   Neck: No  JVD Cardiac: Irregular RR, no murmurs, rubs, or gallops.  Respiratory:  Decreased breath sounds improved from previous GI: Soft, nontender, non-distended, normal bowel sounds  MS:  Trace edema; No deformity. Neuro:    Nonfocal  Psych: Oriented and appropriate   Labs    Chemistry Recent Labs  Lab 01/22/20 1330 01/22/20 1330 01/23/20 0535 01/24/20 0608 01/25/20 1248  NA 137   < > 132* 136 138  K 3.7   < > 4.3 4.8 4.1  CL 106   < > 106 106 103  CO2 20*   < > 19* 17* 22  GLUCOSE 105*   < > 152* 126* 120*  BUN 10   < > 12 18 18   CREATININE 1.26*   < > 1.25* 1.39* 1.06  CALCIUM 8.6*   < > 8.8* 9.4 8.9  PROT 6.0*  --   --   --   --   ALBUMIN 3.2*  --   --   --   --   AST 15  --   --   --   --   ALT 18  --   --   --   --   ALKPHOS 44  --   --   --   --   BILITOT 0.9  --   --   --   --   GFRNONAA 57*   < > 58* 51* >  60  GFRAA >60   < > >60 59* >60  ANIONGAP 11   < > 7 13 13    < > = values in this interval not displayed.     Hematology Recent Labs  Lab 01/22/20 1330 01/23/20 0535  WBC 8.7 5.9  RBC 4.50 4.44  HGB 13.2 12.9*  HCT 41.3 40.6  MCV 91.8 91.4  MCH 29.3 29.1  MCHC 32.0 31.8  RDW 13.7 13.8  PLT 231 212    Cardiac EnzymesNo results for input(s): TROPONINI in the last 168 hours. No results for input(s): TROPIPOC in the last 168 hours.   BNP Recent Labs  Lab 01/22/20 1322  BNP 184.6*     DDimer No results for input(s): DDIMER in the last 168 hours.   Radiology    No results found.  Cardiac Studies    01/2020 echo  1. Left ventricular ejection fraction, by estimation, is 55 to 60%. The  left ventricle has normal function. The left ventricle has no regional  wall motion abnormalities. There is mild concentric left ventricular  hypertrophy. Left ventricular diastolic  function could not be evaluated.  2. Right ventricular systolic function is normal. The right ventricular  size is normal. Tricuspid regurgitation signal is inadequate for assessing  PA pressure.  3. The mitral valve is grossly normal. Mild to moderate mitral valve  regurgitation. No evidence of mitral stenosis.  4. The aortic valve is tricuspid. There is moderate calcification of the   aortic valve. There is moderate thickening of the aortic valve. Aortic  valve regurgitation is not visualized. Mild to moderate aortic valve  sclerosis/calcification is present,  without any evidence of aortic stenosis.  5. Aortic dilatation noted. There is mild dilatation of the aortic root,  measuring 42 mm.  6. The inferior vena cava is dilated in size with >50% respiratory  variability, suggesting right atrial pressure of 8 mmHg.   Patient Profile     74 y.o. male with a hx of lichen planuswith methotrexate and chronic prednisoneuse,andhypothyroidismwho is being seen for the evaluation of new onset AF with RVRat the request of Dr. Darrick Meigs.  Assessment & Plan    Afib with RVR:  [name] has a CHA2DS2 - VASc score of 2.  On Eliquis.    Plan is possible out patient DCCV and sleep study.    Added Bystolic yesterday.  I will increase to 10 mg.  Rate is better and OK to go home.  I will arrange follow up in our clinic.    Mitral regurgitation:   This was mild on echo and we will follow clinically.     Aortic root diltation:  He will likely get a CT angiogram in two years per Dr Johney Frame.     Bronchitis/Aspiration:  Per primary team.    For questions or updates, please contact [place] Please consult [email or web address] for contact info under Cardiology/STEMI.   Signed, Minus Breeding, MD  01/27/2020, 8:14 AM  '

## 2020-02-03 NOTE — Telephone Encounter (Signed)
See message from [name] below. Please advise.  it's been a month since pneumonia diagnosis and 1 week since I left the hospital for pneumonia and afib. I'm still coughing up stuff all night long although it is now high in my chest.  Is this normal and do I need a follow up appt..? i do not have anything scheduled at this time

## 2020-02-04 NOTE — Telephone Encounter (Signed)
Glad to hear that you are home yes she will need a follow-up appointment in the office for your pneumonia typically about 2 weeks with a follow-up chest x-ray It is not uncommon to continue to cough after pneumonia and it takes several weeks to get better as long as you are making slow improvement that is what is most important.  Please contact office for sooner follow up if symptoms do not improve or worsen or seek emergency care

## 2020-02-04 NOTE — Progress Notes (Signed)
Cardiology Office Note:    Date:  02/05/2020   ID:  Todd Harrison, DOB Jan 20, 1946, MRN [id number]  PCP:  Lorene Dy, MD  [facility] Cardiologist:  Freada Bergeron, MD  [facility] Electrophysiologist:  None   Referring MD: Maximiano Coss, NP    History of Present Illness:    Todd Harrison is a 74 y.o. male with a hx of lichen planus on methotrexate and chronic prednisone and hypothyroidism who was recently admitted for Afib with RVR now referred to clinic by Maximiano Coss, NP for further management of his newly diagnosed atrial fibrillation.  Patient was diagnosed with left sided pneumonia and acute asthma exacerbation about 1.5 months ago. He was treated with medrol dose pack and levaquin with modest improvement. He continued to have a productive cough and was following up with his pulmonologist when he was noted to be in Afib with RVR to 130s for which he was transferred to the ED.  During his hospitalization, the patient was rate controlled initially with a dilt gtt later transitioned to cardizem and bystolic. He was initiated on eliquis for anticoagulation with plans to DCCV once he received 3 weeks of AC.  Patient states that since being discharged, he is still having sinus pressure. Denies lightheadedness, dizziness, palpitations, chest pain. Continues to have intermittent episodes of shortness of breath that occurs mainly with exertion but can happen at rest as well. No fevers, chills, nauseas or vomiting. Has continued coughing that has some trace sputum. Has some specs of blood in sputum when coughs real hard, but this resolves. Had nose bleed  Monday night after blowing his nose that lasted about 8-28minutes and it resolved. Otherwise he feels like he is improving. Has follow-up with ENT and Derm today and with Pulm in 2 weeks.  TTE 09/17: Normal EF, mild-moderate MR, calcified AoV without stenosis, aortic root 4.2cm  Labs 01/25/20: Na 138, K 4.1, CUN 18, Cr  1.39-->1.06 TC 163, HDL 34, LDL 96, TG 238, HgB 12.9, ALT 18, TSH 1.17, Plt 212  Past Medical History:  Diagnosis Date  . Lichen planus   . Methotrexate, long term, current use   . Psoriasis   . Thyroid disease     Past Surgical History:  Procedure Laterality Date  . CATARACT EXTRACTION Left    2019    Current Medications: Current Meds  Medication Sig  . albuterol (VENTOLIN HFA) 108 (90 Base) MCG/ACT inhaler Inhale 2 puffs into the lungs every 6 (six) hours as needed for wheezing or shortness of breath.  Marland Kitchen apixaban (ELIQUIS) 5 MG TABS tablet Take 1 tablet (5 mg total) by mouth 2 (two) times daily.  . B Complex Vitamins (VITAMIN B COMPLEX PO) Take 1 tablet by mouth daily.  . betamethasone dipropionate (DIPROLENE) 0.05 % ointment Apply 1 application topically 2 (two) times daily.   . budesonide-formoterol (SYMBICORT) 160-4.5 MCG/ACT inhaler Inhale 2 puffs into the lungs 2 (two) times daily.  Marland Kitchen diltiazem (CARDIZEM CD) 300 MG 24 hr capsule Take 1 capsule (300 mg total) by mouth daily.  Marland Kitchen guaiFENesin (MUCINEX) 600 MG 12 hr tablet Take 2 tablets (1,200 mg total) by mouth 2 (two) times daily as needed.  . ISOtretinoin (ACCUTANE) 40 MG capsule Take 40 mg by mouth daily.   Marland Kitchen levothyroxine (SYNTHROID, LEVOTHROID) 100 MCG tablet Take 100 mcg by mouth daily.  Marland Kitchen loratadine (CLARITIN) 10 MG tablet Take 10 mg by mouth daily.  . meclizine (ANTIVERT) 25 MG tablet Take 25 mg by  mouth 2 (two) times daily.  . montelukast (SINGULAIR) 10 MG tablet TAKE 1 TABLET BY MOUTH EVERYDAY AT BEDTIME  . mycophenolate (CELLCEPT) 500 MG tablet Take 1,500 mg by mouth See admin instructions. Take 3 tablets by mouth  in the morning and three tablets by mouth at night  . nebivolol (BYSTOLIC) 10 MG tablet Take 1 tablet (10 mg total) by mouth daily.  . [DISCONTINUED] omeprazole (PRILOSEC) 40 MG capsule Take 1 capsule (40 mg total) by mouth daily.  . [DISCONTINUED] predniSONE (DELTASONE) 10 MG tablet Take 1 tablet (10 mg  total) by mouth daily. Prednisone 40 mg po daily x 1 day then Prednisone 30 mg po daily x 1 day then Prednisone 20 mg po daily x 1 day then Prednisone 10 mg daily x 1 day then stop...     Allergies:   Gold and Mixed grasses   Social History   Socioeconomic History  . Marital status: Married    Spouse name: Not on file  . Number of children: Not on file  . Years of education: Not on file  . Highest education level: Not on file  Occupational History  . Not on file  Tobacco Use  . Smoking status: Former Smoker    Types: Cigarettes    Quit date: 1982    Years since quitting: 39.7  . Smokeless tobacco: Never Used  Vaping Use  . Vaping Use: Never assessed  Substance and Sexual Activity  . Alcohol use: Yes    Alcohol/week: 1.0 standard drink    Types: 1 Glasses of wine per week  . Drug use: No  . Sexual activity: Not on file  Other Topics Concern  . Not on file  Social History Narrative  . Not on file   Social Determinants of Health   Financial Resource Strain:   . Difficulty of Paying Living Expenses: Not on file  Food Insecurity:   . Worried About Charity fundraiser in the Last Year: Not on file  . Ran Out of Food in the Last Year: Not on file  Transportation Needs:   . Lack of Transportation (Medical): Not on file  . Lack of Transportation (Non-Medical): Not on file  Physical Activity:   . Days of Exercise per Week: Not on file  . Minutes of Exercise per Session: Not on file  Stress:   . Feeling of Stress : Not on file  Social Connections:   . Frequency of Communication with Friends and Family: Not on file  . Frequency of Social Gatherings with Friends and Family: Not on file  . Attends Religious Services: Not on file  . Active Member of Clubs or Organizations: Not on file  . Attends Archivist Meetings: Not on file  . Marital Status: Not on file     Family History: The patient's family history includes Diabetes in his mother; Stroke in his  mother.  ROS:   Please see the history of present illness.    Continues to have SOB with exertion, LE edema with steroid medication, wheezing and coughing.  The patient denies chest pain, chest pressure, palpitations, PND, orthopnea. Denies  fever, chills. Denies nausea, vomiting. Denies syncope or presyncope. Denies dizziness or lightheadedness.   EKGs/Labs/Other Studies Reviewed:    The following studies were reviewed today: TTE: 1. Left ventricular ejection fraction, by estimation, is 55 to 60%. The  left ventricle has normal function. The left ventricle has no regional  wall motion abnormalities. There is mild concentric left  ventricular  hypertrophy. Left ventricular diastolic  function could not be evaluated.  2. Right ventricular systolic function is normal. The right ventricular  size is normal. Tricuspid regurgitation signal is inadequate for assessing  PA pressure.  3. The mitral valve is grossly normal. Mild to moderate mitral valve  regurgitation. No evidence of mitral stenosis.  4. The aortic valve is tricuspid. There is moderate calcification of the  aortic valve. There is moderate thickening of the aortic valve. Aortic  valve regurgitation is not visualized. Mild to moderate aortic valve  sclerosis/calcification is present,  without any evidence of aortic stenosis.  5. Aortic dilatation noted. There is mild dilatation of the aortic root,  measuring 42 mm.  6. The inferior vena cava is dilated in size with >50% respiratory  variability, suggesting right atrial pressure of 8 mmHg.   EKG:  EKG is ordered today.  The ekg ordered today demonstrates Afib with HR 94  Recent Labs: 01/22/2020: ALT 18; B Natriuretic Peptide 184.6; Magnesium 2.1; TSH 1.171 01/23/2020: Hemoglobin 12.9; Platelets 212 01/25/2020: BUN 18; Creatinine, Ser 1.06; Potassium 4.1; Sodium 138  Recent Lipid Panel No results found for: CHOL, TRIG, HDL, CHOLHDL, VLDL, LDLCALC, LDLDIRECT  Physical  Exam:    VS:  BP 130/86   Pulse 94   Ht 6' (1.829 m)   Wt 294 lb 12.8 oz (133.7 kg)   SpO2 94%   BMI 39.98 kg/m     Wt Readings from Last 3 Encounters:  02/05/20 294 lb 12.8 oz (133.7 kg)  01/27/20 294 lb (133.4 kg)  01/22/20 299 lb 9.6 oz (135.9 kg)     GEN:  Comfortable, continues to have voice hoarseness HEENT: Normal NECK: No JVD; No carotid bruits LYMPHATICS: No lymphadenopathy CARDIAC: Irregularly irregular, 1/6 systolic murmur, no rubs, gallops RESPIRATORY:  Difficulty taking a full deep breath but clear bilaterally ABDOMEN: Soft, non-tender, non-distended MUSCULOSKELETAL:  Warm, 1+ edema to mid-shin SKIN: Warm and dry NEUROLOGIC:  Alert and oriented x 3 PSYCHIATRIC:  Normal affect   ASSESSMENT:    1. Atrial fibrillation with RVR ([place])   2. Aortic root aneurysm ([place])   3. Moderate mitral regurgitation    PLAN:    In order of problems listed above:  #Atrial fibrillation: Remains in Afib today. CHADs-VASC 2. On eliquis. Will plan on DCCV after 3 weeks of full anticoagulation. -Continue bystolic 10mg  daily and cardezem 300mg  daily for rate control -Plan for DCCV in mid-October after 3 weeks of AC -Follow-up with Pulm as scheduled; needs sleep study for OSA  #Mild-to-Moderate MR: Noted on TTE on 01/2020. -Needs surveillance TTE every 2-3 years  #Aortic root aneurysm (4.101mm): Noted on TTE on 01/2020.  -Yearly surveillance imaging -Blood pressure control with bystolic and diltiazem -Will keep home blood pressure log for 5 days with goal SBP<130; will let me know what his blood pressures are running and can up-titrate medications as needed -Advised against heavy lifting   #Obesity: -Encouraged mediterranean diet and increased activity; goal 150 minutes moderate activity per week  #Lichen Planus: On chronic methotrexate. Off steroids now. -Follow-up with Derm as scheduled    Medication Adjustments/Labs and Tests Ordered: Current medicines are reviewed  at length with the patient today.  Concerns regarding medicines are outlined above.  Orders Placed This Encounter  Procedures  . CBC  . Basic Metabolic Panel (BMET)  . EKG 12-Lead   No orders of the defined types were placed in this encounter.   Patient Instructions  Medication Instructions:  Your  physician recommends that you continue on your current medications as directed. Please refer to the Current Medication list given to you today.  *If you need a refill on your cardiac medications before your next appointment, please call your pharmacy*   Lab Work: Your physician recommends that you return for lab work in: 3 weeks on Friday Oct. 15 - come in before your Covid screening appointment. Our lab opens at 7:30 am. You do not have to fast for your lab work.  If you have labs (blood work) drawn today and your tests are completely normal, you will receive your results only by: Marland Kitchen MyChart Message (if you have MyChart) OR . A paper copy in the mail If you have any lab test that is abnormal or we need to change your treatment, we will call you to review the results.   Testing/Procedures: You are scheduled for a TEE/Cardioversion/TEE Cardioversion on Monday October 18 with Dr. Glenford Bayley.  Please arrive at the [facility] (Main Entrance A) at [facility]: [address] [place], [place] [address] at 7:00 am. (1 hour prior to procedure unless lab work is needed; if lab work is needed arrive 1.5 hours ahead)  DIET: Nothing to eat or drink after midnight except a sip of water with medications (see medication instructions below)  Medication Instructions:  Continue your anticoagulant: Eliquis You will need to continue your anticoagulant after your procedure until  you are told by your Provider that it is safe to stop    Due to recent COVID-19 restrictions implemented by our local and state authorities and in an effort to keep both patients and staff as safe as possible, our  hospital system requires COVID-19 testing prior to certain scheduled hospital procedures.  Please go to [place]. [place], [place] [address] on Friday Oct. 15 at 9:55 am  .  This is a drive up testing site.  You will not need to exit your vehicle.  You will not be billed at the time of testing but may receive a bill later depending on your insurance. You must agree to self-quarantine from the time of your testing until the procedure date on Monday Oct. 18.  This should included staying home with ONLY the people you live with.  Avoid take-out, grocery store shopping or leaving the house for any non-emergent reason.  Failure to have your COVID-19 test done on the date and time you have been scheduled will result in cancellation of your procedure.  Please call our office at [phone] if you have any questions.   You must have a responsible person to drive you home and stay in the waiting area during your procedure. Failure to do so could result in cancellation.  Bring your insurance cards.  *Special Note: Every effort is made to have your procedure done on time. Occasionally there are emergencies that occur at the hospital that may cause delays. Please be patient if a delay does occur.     Follow-Up: At [facility], you and your health needs are our priority.  As part of our continuing mission to provide you with exceptional heart care, we have created designated Provider Care Teams.  These Care Teams include your primary Cardiologist (physician) and Advanced Practice Providers (APPs -  Physician Assistants and Nurse Practitioners) who all work together to provide you with the care you need, when you need it.   Your next appointment:   8 week(s) on Monday November 22 at 8:00 am  The  format for your next appointment:   In Person  Provider:   Gwyndolyn Kaufman, MD            Signed, Freada Bergeron, MD  02/05/2020 10:07 AM    [place]

## 2020-02-05 ENCOUNTER — Other Ambulatory Visit: Payer: Self-pay

## 2020-02-05 ENCOUNTER — Ambulatory Visit (INDEPENDENT_AMBULATORY_CARE_PROVIDER_SITE_OTHER): Payer: Medicare HMO | Admitting: Cardiology

## 2020-02-05 ENCOUNTER — Encounter: Payer: Self-pay | Admitting: Cardiology

## 2020-02-05 VITALS — BP 130/86 | HR 94 | Ht 72.0 in | Wt 294.8 lb

## 2020-02-05 DIAGNOSIS — I4891 Unspecified atrial fibrillation: Secondary | ICD-10-CM | POA: Diagnosis not present

## 2020-02-05 DIAGNOSIS — I719 Aortic aneurysm of unspecified site, without rupture: Secondary | ICD-10-CM | POA: Diagnosis not present

## 2020-02-05 DIAGNOSIS — I7121 Aneurysm of the ascending aorta, without rupture: Secondary | ICD-10-CM

## 2020-02-05 DIAGNOSIS — I340 Nonrheumatic mitral (valve) insufficiency: Secondary | ICD-10-CM | POA: Diagnosis not present

## 2020-02-05 NOTE — Patient Instructions (Addendum)
Medication Instructions:  Your physician recommends that you continue on your current medications as directed. Please refer to the Current Medication list given to you today.  *If you need a refill on your cardiac medications before your next appointment, please call your pharmacy*   Lab Work: Your physician recommends that you return for lab work in: 3 weeks on Friday Oct. 15 - come in before your Covid screening appointment. Our lab opens at 7:30 am. You do not have to fast for your lab work.  If you have labs (blood work) drawn today and your tests are completely normal, you will receive your results only by: Marland Kitchen MyChart Message (if you have MyChart) OR . A paper copy in the mail If you have any lab test that is abnormal or we need to change your treatment, we will call you to review the results.   Testing/Procedures: You are scheduled for a TEE/Cardioversion/TEE Cardioversion on Monday October 18 with Dr. Glenford Bayley.  Please arrive at the [facility] (Main Entrance A) at [facility]: [address] [place], [place] [address] at 7:00 am. (1 hour prior to procedure unless lab work is needed; if lab work is needed arrive 1.5 hours ahead)  DIET: Nothing to eat or drink after midnight except a sip of water with medications (see medication instructions below)  Medication Instructions:  Continue your anticoagulant: Eliquis You will need to continue your anticoagulant after your procedure until  you are told by your Provider that it is safe to stop    Due to recent COVID-19 restrictions implemented by our local and state authorities and in an effort to keep both patients and staff as safe as possible, our hospital system requires COVID-19 testing prior to certain scheduled hospital procedures.  Please go to [place]. [place], [place] [address] on Friday Oct. 15 at 9:55 am  .  This is a drive up testing site.  You will not need to exit your vehicle.  You will not be billed at  the time of testing but may receive a bill later depending on your insurance. You must agree to self-quarantine from the time of your testing until the procedure date on Monday Oct. 18.  This should included staying home with ONLY the people you live with.  Avoid take-out, grocery store shopping or leaving the house for any non-emergent reason.  Failure to have your COVID-19 test done on the date and time you have been scheduled will result in cancellation of your procedure.  Please call our office at [phone] if you have any questions.   You must have a responsible person to drive you home and stay in the waiting area during your procedure. Failure to do so could result in cancellation.  Bring your insurance cards.  *Special Note: Every effort is made to have your procedure done on time. Occasionally there are emergencies that occur at the hospital that may cause delays. Please be patient if a delay does occur.     Follow-Up: At [facility], you and your health needs are our priority.  As part of our continuing mission to provide you with exceptional heart care, we have created designated Provider Care Teams.  These Care Teams include your primary Cardiologist (physician) and Advanced Practice Providers (APPs -  Physician Assistants and Nurse Practitioners) who all work together to provide you with the care you need, when you need it.   Your next appointment:   8 week(s) on Monday November 22 at  8:00 am  The format for your next appointment:   In Person  Provider:   Gwyndolyn Kaufman, MD

## 2020-02-16 ENCOUNTER — Other Ambulatory Visit: Payer: Self-pay

## 2020-02-16 ENCOUNTER — Encounter: Payer: Self-pay | Admitting: Adult Health

## 2020-02-16 ENCOUNTER — Ambulatory Visit (INDEPENDENT_AMBULATORY_CARE_PROVIDER_SITE_OTHER): Payer: Medicare HMO

## 2020-02-16 ENCOUNTER — Ambulatory Visit: Payer: Medicare HMO | Admitting: Adult Health

## 2020-02-16 VITALS — BP 112/78 | HR 72 | Ht 72.0 in | Wt 289.0 lb

## 2020-02-16 DIAGNOSIS — J4541 Moderate persistent asthma with (acute) exacerbation: Secondary | ICD-10-CM

## 2020-02-16 DIAGNOSIS — R400 Somnolence: Secondary | ICD-10-CM

## 2020-02-16 DIAGNOSIS — J189 Pneumonia, unspecified organism: Secondary | ICD-10-CM

## 2020-02-16 DIAGNOSIS — G4719 Other hypersomnia: Secondary | ICD-10-CM | POA: Insufficient documentation

## 2020-02-16 DIAGNOSIS — I4891 Unspecified atrial fibrillation: Secondary | ICD-10-CM | POA: Diagnosis not present

## 2020-02-16 LAB — CBC WITH DIFFERENTIAL/PLATELET
Basophils Absolute: 0.1 10*3/uL (ref 0.0–0.1)
Basophils Relative: 1 % (ref 0.0–3.0)
Eosinophils Absolute: 0.1 10*3/uL (ref 0.0–0.7)
Eosinophils Relative: 0.9 % (ref 0.0–5.0)
HCT: 38.4 % — ABNORMAL LOW (ref 39.0–52.0)
Hemoglobin: 12.6 g/dL — ABNORMAL LOW (ref 13.0–17.0)
Lymphocytes Relative: 20 % (ref 12.0–46.0)
Lymphs Abs: 1.7 10*3/uL (ref 0.7–4.0)
MCHC: 33 g/dL (ref 30.0–36.0)
MCV: 89 fl (ref 78.0–100.0)
Monocytes Absolute: 0.9 10*3/uL (ref 0.1–1.0)
Monocytes Relative: 10.8 % (ref 3.0–12.0)
Neutro Abs: 5.8 10*3/uL (ref 1.4–7.7)
Neutrophils Relative %: 67.3 % (ref 43.0–77.0)
Platelets: 256 10*3/uL (ref 150.0–400.0)
RBC: 4.31 Mil/uL (ref 4.22–5.81)
RDW: 15 % (ref 11.5–15.5)
WBC: 8.6 10*3/uL (ref 4.0–10.5)

## 2020-02-16 NOTE — Assessment & Plan Note (Signed)
Daytime hypersomnolence, snoring, restless sleep, newly diagnosed A. fib all are placing patient at high risk for underlying sleep apnea.  Patient's BMI is 39.  Recent echo showed preserved EF.  No history of stroke. We will set up for a home sleep study.

## 2020-02-16 NOTE — Assessment & Plan Note (Signed)
Recent new onset A. fib with RVR with hospitalization now improved.  Patient is on anticoagulation and Cardizem and Bystolic. Patient has planned cardioversion for next week.  Patient is continue follow-up with cardiology as planned. We will check home sleep study for underlying sleep apnea

## 2020-02-16 NOTE — Assessment & Plan Note (Addendum)
Slowly resolving asthmatic bronchitic exacerbation with recent pneumonia.  Patient is clinically improving.  Seems to have triggers for reflux mild dysphagia and chronic rhinitis.  We discussed trigger prevention and management. We will continue on Symbicort.  Patient has been off of steroids for 2 weeks.  We will check an IgE and CBC with differential. Continue on Singulair Zyrtec and Flonase.  Plan  Patient Instructions  Symbicort 160 2 puffs Twice daily  , rinse after use.  Albuterol inhaler As needed  Wheezing  Albuterol Nebs every 6hr as needed for wheezing .  Continue on Singulair 10mg  At bedtime .  Zyrtec 10mg  At bedtime.  Saline nasal spray Laretta Alstrom Twice daily  .  Add Flonase 2 puff daily.  Mucinex DM Twice daily  As needed  Cough/congestion  Pepcid 20mg  Twice daily.  Set Home sleep study .  Chest xray today .  Labs today .  CT chest  in  3 months   Follow up with Dr. Valeta Harms in 2-3 months and As needed   Please contact office for sooner follow up if symptoms do not improve or worsen or seek emergency care

## 2020-02-16 NOTE — Progress Notes (Signed)
PCCM: Thanks for seeing him  Garner Nash, DO [facility] 02/16/2020 6:34 PM

## 2020-02-16 NOTE — Progress Notes (Signed)
@Patient  ID: Todd Harrison, male    DOB: 16-May-1945, 74 y.o.   MRN: [id number]  Chief Complaint  Patient presents with  . Follow-up    Asthma     Referring provider: Lorene Dy, MD  HPI: 74 year old male former smoker seen for pulmonary consult April 15, 2019 for cough felt to be consistent with mild persistent asthma and upper airway cough possibly secondary to chronic rhinitis and GERD Medical history significant for lichen planus currently on CellCept (previously on methotrexate) Patient is a Engineer, technical sales. Works in [employer] .   TEST/EVENTS :  June 12, 2019 that showed normal lung function with FEV1 at 87%, ratio 76, FVC 84%. No significant bronchodilator response. DLCO was 87%.  Chest x-ray done October 17, 2019 showed no acute process. CT sinus December 06, 2019 paranasal sinuses well aerated. No evidence of sinusitis. Nasal septal deviation leftward  Sputum culture October 24, 2019 growth of normal oral flora.  2D echo January 23, 2020 showed EF 55 to 33%, RV systolic function is normal.  RV size is normal.  Moderate calcification of the aortic valve and thickening.  Mild to moderate aortic valve sclerosis.  No evidence of aortic stenosis.   02/16/2020 Follow up : Asthma Patient presents for 1 month follow-up.  At last visit patient had been having a slow to resolve asthmatic bronchitic exacerbation with superimposed pneumonia.  He had been treated with antibiotics and steroids.  On return follow-up visit last visit he had notable tachycardia on exam with heart rate at 162.  Patient was referred to the emergency room and had subsequent hospitalization for new onset A. fib with RVR.  His thyroid was normal.  Patient was treated with Cardizem and started on anticoagulation.  Cardiology is planning an outpatient cardioversion after 3 weeks of anticoagulation.  He was also started on Bystolic at discharge.  Patient was treated for an acute asthmatic bronchitic  exacerbation that was slow to resolve along with a pneumonia.  He did receive steroids and antibiotics.  QuantiFERON gold testing was negative.  Respiratory panel was negative.  He was discharged on a steroid taper.  CT chest during hospitalization showed a patchy opacity in the left lung base.  Since last visit patient says he is feeling better, feels like he starting to get his strength back up cough and wheezing have decreased but not totally resolved.  Patient says he did have a uptake of sinus drainage and cough 1 week ago and was seen by ENT given antibiotics which he is finishing up now.  During hospitalization he did have a modified barium swallow that showed some mild dysphagia and aspiration risk he was placed on aspiration precautions.  Patient does have known reflux and is on Pepcid twice daily. Hospital H&P and discharge summary was reviewed.  He does have an upcoming cardioversion with cardiology and planned for next week.  Patient had previously been evaluated for possible underlying sleep apnea.  Now with new onset A. fib.  As he has nighttime restless sleep snoring and daytime sleepiness.  He has at risk for underlying sleep apnea.  We discussed proceeding with a home sleep study which she is in agreement. Patient says he sleeps about 5 hours each night.  Gets up 1-2 times throughout the night.  Wakes up tired and unrefreshed.  Also has some daytime sleepiness.  If he tries to sit down to watch TV will fall asleep.  Does drink some coffee in the morning.  Allergies  Allergen Reactions  . Gold Itching  . Mixed Grasses     unknown    Immunization History  Administered Date(s) Administered  . Fluad Quad(high Dose 65+) 03/04/2019, 01/25/2020  . Influenza-Unspecified 03/08/2016  . PFIZER SARS-COV-2 Vaccination 06/13/2019, 07/04/2019  . Pneumococcal Polysaccharide-23 01/25/2020    Past Medical History:  Diagnosis Date  . Lichen planus   . Methotrexate, long term, current use    . Psoriasis   . Thyroid disease     Tobacco History: Social History   Tobacco Use  Smoking Status Former Smoker  . Types: Cigarettes  . Quit date: 56  . Years since quitting: 39.8  Smokeless Tobacco Never Used   Counseling given: Not Answered   Outpatient Medications Prior to Visit  Medication Sig Dispense Refill  . albuterol (VENTOLIN HFA) 108 (90 Base) MCG/ACT inhaler Inhale 2 puffs into the lungs every 6 (six) hours as needed for wheezing or shortness of breath. 18 g 3  . apixaban (ELIQUIS) 5 MG TABS tablet Take 1 tablet (5 mg total) by mouth 2 (two) times daily. 60 tablet 2  . B Complex Vitamins (VITAMIN B COMPLEX PO) Take 1 tablet by mouth daily.    . betamethasone dipropionate (DIPROLENE) 0.05 % ointment Apply 1 application topically 2 (two) times daily.     . budesonide-formoterol (SYMBICORT) 160-4.5 MCG/ACT inhaler Inhale 2 puffs into the lungs 2 (two) times daily. 1 each 12  . diltiazem (CARDIZEM CD) 300 MG 24 hr capsule Take 1 capsule (300 mg total) by mouth daily. 60 capsule 2  . famotidine (PEPCID) 20 MG tablet Take 20 mg by mouth 2 (two) times daily.    Marland Kitchen guaiFENesin (MUCINEX) 600 MG 12 hr tablet Take 2 tablets (1,200 mg total) by mouth 2 (two) times daily as needed. 20 tablet 0  . ISOtretinoin (ACCUTANE) 40 MG capsule Take 40 mg by mouth 2 (two) times daily.     Marland Kitchen levothyroxine (SYNTHROID, LEVOTHROID) 100 MCG tablet Take 100 mcg by mouth daily before breakfast.     . loratadine (CLARITIN) 10 MG tablet Take 10 mg by mouth daily.     . meclizine (ANTIVERT) 25 MG tablet Take 25 mg by mouth 2 (two) times daily.     . mometasone-formoterol (DULERA) 100-5 MCG/ACT AERO Inhale 2 puffs into the lungs 2 (two) times daily as needed for wheezing.    . montelukast (SINGULAIR) 10 MG tablet TAKE 1 TABLET BY MOUTH EVERYDAY AT BEDTIME (Patient taking differently: Take 10 mg by mouth at bedtime. ) 90 tablet 1  . Multiple Minerals-Vitamins (CALCIUM-MAGNESIUM-ZINC-D3) TABS Take 1  tablet by mouth daily.    . mycophenolate (CELLCEPT) 500 MG tablet Take 1,500 mg by mouth 2 (two) times daily.     . nebivolol (BYSTOLIC) 10 MG tablet Take 1 tablet (10 mg total) by mouth daily. 30 tablet 2   No facility-administered medications prior to visit.     Review of Systems:   Constitutional:   No  weight loss, night sweats,  Fevers, chills, + fatigue, or  lassitude.  HEENT:   No headaches,  Difficulty swallowing,  Tooth/dental problems, or  Sore throat,                No sneezing, itching, ear ache, nasal congestion, post nasal drip,   CV:  No chest pain,  Orthopnea, PND, +swelling in lower extremities, anasarca, dizziness, palpitations, syncope.   GI  No heartburn, indigestion, abdominal pain, nausea, vomiting, diarrhea, change in bowel habits, loss  of appetite, bloody stools.   Resp:    No chest wall deformity  Skin: no rash or lesions.  GU: no dysuria, change in color of urine, no urgency or frequency.  No flank pain, no hematuria   MS:  No joint pain or swelling.  No decreased range of motion.  No back pain.    Physical Exam  BP 112/78 (BP Location: Left Arm, Cuff Size: Normal)   Pulse 72   Ht 6' (1.829 m)   Wt 289 lb (131.1 kg)   SpO2 94%   BMI 39.20 kg/m   GEN: A/Ox3; pleasant , NAD, well nourished    HEENT:  [place]/AT,    NOSE-clear, THROAT-clear, no lesions, no postnasal drip or exudate noted.  Class II-III MP airway  NECK:  Supple w/ fair ROM; no JVD; normal carotid impulses w/o bruits; no thyromegaly or nodules palpated; no lymphadenopathy.    RESP few trace expiratory wheezes on forced expiration no accessory muscle use, no dullness to percussion  CARD:  RRR, no m/r/g, 1+ peripheral edema, pulses intact, no cyanosis or clubbing.  GI:   Soft & nt; nml bowel sounds; no organomegaly or masses detected.   Musco: Warm bil, no deformities or joint swelling noted.   Neuro: alert, no focal deficits noted.    Skin: Warm, no lesions or rashes    Lab  Results:  CBC    Component Value Date/Time   WBC 5.9 01/23/2020 0535   RBC 4.44 01/23/2020 0535   HGB 12.9 (L) 01/23/2020 0535   HGB 13.8 01/01/2020 1359   HCT 40.6 01/23/2020 0535   HCT 42.7 01/01/2020 1359   PLT 212 01/23/2020 0535   PLT 275 01/01/2020 1359   MCV 91.4 01/23/2020 0535   MCV 88 01/01/2020 1359   MCH 29.1 01/23/2020 0535   MCHC 31.8 01/23/2020 0535   RDW 13.8 01/23/2020 0535   RDW 12.8 01/01/2020 1359   LYMPHSABS 1.2 01/22/2020 1330   LYMPHSABS 1.4 01/01/2020 1359   MONOABS 1.0 01/22/2020 1330   EOSABS 0.1 01/22/2020 1330   EOSABS 0.1 01/01/2020 1359   BASOSABS 0.1 01/22/2020 1330   BASOSABS 0.1 01/01/2020 1359    BMET    Component Value Date/Time   NA 138 01/25/2020 1248   NA 136 01/01/2020 1359   K 4.1 01/25/2020 1248   CL 103 01/25/2020 1248   CO2 22 01/25/2020 1248   GLUCOSE 120 (H) 01/25/2020 1248   BUN 18 01/25/2020 1248   BUN 9 01/01/2020 1359   CREATININE 1.06 01/25/2020 1248   CALCIUM 8.9 01/25/2020 1248   GFRNONAA >60 01/25/2020 1248   GFRAA >60 01/25/2020 1248    BNP    Component Value Date/Time   BNP 184.6 (H) 01/22/2020 1322    ProBNP No results found for: PROBNP  Imaging: CT Chest Wo Contrast  Result Date: 01/22/2020 CLINICAL DATA:  Persistent cough EXAM: CT CHEST WITHOUT CONTRAST TECHNIQUE: Multidetector CT imaging of the chest was performed following the standard protocol without IV contrast. COMPARISON:  Radiograph same day FINDINGS: Cardiovascular: There are no significant vascular findings. Scattered vascular calcifications are seen within the aorta. Coronary artery calcifications are seen. The heart size is normal. There is no pericardial thickening or effusion. Mediastinum/Nodes: There are no enlarged mediastinal, hilar or axillary lymph nodes. The thyroid gland, trachea and esophagus demonstrate no significant findings. Lungs/Pleura: Rounded patchy airspace opacity is seen within the posterior left lung base with mild  bronchial wall thickening. There is small tree-in-bud opacities also  noted within the right lung base. Upper abdomen: The visualized portion of the upper abdomen is unremarkable. Musculoskeletal/Chest wall: There is no chest wall mass or suspicious osseous finding. No acute osseous abnormality IMPRESSION: Patchy airspace opacity with tree-in-bud opacities at the posterior left lung base which could be due to resolving infectious etiology from August 2021 or inflammatory process. Electronically Signed   By: Prudencio Pair M.D.   On: 01/22/2020 17:56   DG Chest Portable 1 View  Result Date: 01/22/2020 CLINICAL DATA:  Cough for weeks EXAM: PORTABLE CHEST 1 VIEW COMPARISON:  01/01/2020 FINDINGS: Normal heart size, mediastinal contours, and pulmonary vascularity. Lungs clear. Resolution of LEFT mid lung infiltrates since previous exam. No pleural effusion or pneumothorax. Osseous structures unremarkable. IMPRESSION: Resolution of previously identified pneumonia in LEFT mid lung. No new abnormalities. Electronically Signed   By: Lavonia Dana M.D.   On: 01/22/2020 13:50   ECHOCARDIOGRAM COMPLETE  Result Date: 01/23/2020    ECHOCARDIOGRAM REPORT   Patient Name:   Todd Harrison Date of Exam: 01/23/2020 Medical Rec #:  [id number]       Height:       72.0 in Accession #:    [id number]      Weight:       299.0 lb Date of Birth:  [birth year]      BSA:          2.528 m Patient Age:    62 years        BP:           115/77 mmHg Patient Gender: M               HR:           110 bpm. Exam Location:  Inpatient Procedure: 2D Echo, Cardiac Doppler, Color Doppler and Intracardiac            Opacification Agent Indications:    R06.02 SOB; I48.91* Unspeicified atrial fibrillation  History:        Patient has no prior history of Echocardiogram examinations.                 Abnormal ECG, Arrythmias:Atrial Fibrillation;                 Signs/Symptoms:Dyspnea. Pneumonia.  Sonographer:    Roseanna Rainbow RDCS Referring Phys: [id number] [place] Comments: Technically difficult study due to poor echo windows. Image acquisition challenging due to respiratory motion. Imaging difficult due to respiratory difficulty. IMPRESSIONS  1. Left ventricular ejection fraction, by estimation, is 55 to 60%. The left ventricle has normal function. The left ventricle has no regional wall motion abnormalities. There is mild concentric left ventricular hypertrophy. Left ventricular diastolic function could not be evaluated.  2. Right ventricular systolic function is normal. The right ventricular size is normal. Tricuspid regurgitation signal is inadequate for assessing PA pressure.  3. The mitral valve is grossly normal. Mild to moderate mitral valve regurgitation. No evidence of mitral stenosis.  4. The aortic valve is tricuspid. There is moderate calcification of the aortic valve. There is moderate thickening of the aortic valve. Aortic valve regurgitation is not visualized. Mild to moderate aortic valve sclerosis/calcification is present, without any evidence of aortic stenosis.  5. Aortic dilatation noted. There is mild dilatation of the aortic root, measuring 42 mm.  6. The inferior vena cava is dilated in size with >50% respiratory variability, suggesting right atrial pressure of 8 mmHg. FINDINGS  Left Ventricle: Left ventricular  ejection fraction, by estimation, is 55 to 60%. The left ventricle has normal function. The left ventricle has no regional wall motion abnormalities. Definity contrast agent was given IV to delineate the left ventricular  endocardial borders. The left ventricular internal cavity size was normal in size. There is mild concentric left ventricular hypertrophy. Left ventricular diastolic function could not be evaluated due to atrial fibrillation. Left ventricular diastolic function could not be evaluated. Right Ventricle: The right ventricular size is normal. No increase in right ventricular wall thickness. Right ventricular systolic  function is normal. Tricuspid regurgitation signal is inadequate for assessing PA pressure. Left Atrium: Left atrial size was normal in size. Right Atrium: Right atrial size was normal in size. Pericardium: Trivial pericardial effusion is present. Presence of pericardial fat pad. Mitral Valve: The mitral valve is grossly normal. Mild to moderate mitral valve regurgitation. No evidence of mitral valve stenosis. Tricuspid Valve: The tricuspid valve is grossly normal. Tricuspid valve regurgitation is trivial. No evidence of tricuspid stenosis. Aortic Valve: The aortic valve is tricuspid. There is moderate calcification of the aortic valve. There is moderate thickening of the aortic valve. Aortic valve regurgitation is not visualized. Mild to moderate aortic valve sclerosis/calcification is present, without any evidence of aortic stenosis. Aortic valve mean gradient measures 7.0 mmHg. Aortic valve peak gradient measures 13.5 mmHg. Aortic valve area, by VTI measures 3.68 cm. Pulmonic Valve: The pulmonic valve was grossly normal. Pulmonic valve regurgitation is not visualized. No evidence of pulmonic stenosis. Aorta: Aortic dilatation noted. There is mild dilatation of the aortic root, measuring 42 mm. Venous: The inferior vena cava is dilated in size with greater than 50% respiratory variability, suggesting right atrial pressure of 8 mmHg. IAS/Shunts: The atrial septum is grossly normal.  LEFT VENTRICLE PLAX 2D LVIDd:         4.60 cm LVIDs:         4.10 cm LV PW:         1.20 cm LV IVS:        1.20 cm LVOT diam:     2.80 cm LV SV:         120 LV SV Index:   48 LVOT Area:     6.16 cm  LV Volumes (MOD) LV vol d, MOD A2C: 62.2 ml LV vol d, MOD A4C: 115.5 ml LV vol s, MOD A2C: 29.0 ml LV vol s, MOD A4C: 64.6 ml LV SV MOD A2C:     33.2 ml LV SV MOD A4C:     115.5 ml LV SV MOD BP:      43.8 ml RIGHT VENTRICLE         IVC TAPSE (M-mode): 1.6 cm  IVC diam: 2.80 cm LEFT ATRIUM             Index       RIGHT ATRIUM            Index LA diam:        4.40 cm 1.74 cm/m  RA Area:     20.80 cm LA Vol (A2C):   86.5 ml 34.22 ml/m RA Volume:   49.40 ml  19.54 ml/m LA Vol (A4C):   73.5 ml 29.08 ml/m LA Biplane Vol: 81.5 ml 32.24 ml/m  AORTIC VALVE AV Area (Vmax):    3.61 cm AV Area (Vmean):   4.06 cm AV Area (VTI):     3.68 cm AV Vmax:           184.00 cm/s AV Vmean:  119.000 cm/s AV VTI:            0.326 m AV Peak Grad:      13.5 mmHg AV Mean Grad:      7.0 mmHg LVOT Vmax:         108.00 cm/s LVOT Vmean:        78.500 cm/s LVOT VTI:          0.195 m LVOT/AV VTI ratio: 0.60  AORTA Ao Root diam: 4.20 cm Ao Asc diam:  4.10 cm MR PISA:        0.25 cm MR PISA Radius: 0.20 cm  SHUNTS                          Systemic VTI:  0.20 m                          Systemic Diam: 2.80 cm Eleonore Chiquito MD Electronically signed by Eleonore Chiquito MD Signature Date/Time: 01/23/2020/11:06:07 AM    Final       PFT Results Latest Ref Rng & Units 06/12/2019  FVC-Pre L 4.00  FVC-Predicted Pre % 84  FVC-Post L 4.07  FVC-Predicted Post % 86  Pre FEV1/FVC % % 76  Post FEV1/FCV % % 73  FEV1-Pre L 3.04  FEV1-Predicted Pre % 87  FEV1-Post L 2.98  DLCO uncorrected ml/min/mmHg 24.05  DLCO UNC% % 87  DLVA Predicted % 94  TLC L 6.46  TLC % Predicted % 86  RV % Predicted % 87    No results found for: NITRICOXIDE      Assessment & Plan:   Asthmatic bronchitis Slowly resolving asthmatic bronchitic exacerbation with recent pneumonia.  Patient is clinically improving.  Seems to have triggers for reflux mild dysphagia and chronic rhinitis.  We discussed trigger prevention and management. We will continue on Symbicort.  Patient has been off of steroids for 2 weeks.  We will check an IgE and CBC with differential. Continue on Singulair Zyrtec and Flonase.  Plan  Patient Instructions  Symbicort 160 2 puffs Twice daily  , rinse after use.  Albuterol inhaler As needed  Wheezing  Albuterol Nebs every 6hr as needed for wheezing .  Continue  on Singulair 10mg  At bedtime .  Zyrtec 10mg  At bedtime.  Saline nasal spray Laretta Alstrom Twice daily  .  Add Flonase 2 puff daily.  Mucinex DM Twice daily  As needed  Cough/congestion  Pepcid 20mg  Twice daily.  Set Home sleep study .  Chest xray today .  Labs today .  CT chest w/ contrast in  3 months   Follow up with Dr. Valeta Harms in 2-3 months and As needed   Please contact office for sooner follow up if symptoms do not improve or worsen or seek emergency care       Pneumonia Recent left lower lobe pneumonia clinically starting to improve.  Follow-up CT chest last month showed patchy left lower lobe opacities.  We will do a follow-up CT in 3 months for clearance.  Patient is on immunosuppression which may be contributing to his recurrent respiratory infections.  Have asked him to discuss this with dermatology who follows him for his lichen planus.   Plan  Patient Instructions  Symbicort 160 2 puffs Twice daily  , rinse after use.  Albuterol inhaler As needed  Wheezing  Albuterol Nebs every 6hr as needed for wheezing .  Continue on Singulair 10mg   At bedtime .  Zyrtec 10mg  At bedtime.  Saline nasal spray Laretta Alstrom Twice daily  .  Add Flonase 2 puff daily.  Mucinex DM Twice daily  As needed  Cough/congestion  Pepcid 20mg  Twice daily.  Set Home sleep study .  Chest xray today .  Labs today .  CT chest w/ contrast in  3 months   Follow up with Dr. Valeta Harms in 2-3 months and As needed   Please contact office for sooner follow up if symptoms do not improve or worsen or seek emergency care       Atrial fibrillation with RVR ([place]) Recent new onset A. fib with RVR with hospitalization now improved.  Patient is on anticoagulation and Cardizem and Bystolic. Patient has planned cardioversion for next week.  Patient is continue follow-up with cardiology as planned. We will check home sleep study for underlying sleep apnea  Daytime hypersomnolence Daytime hypersomnolence, snoring, restless sleep,  newly diagnosed A. fib all are placing patient at high risk for underlying sleep apnea.  Patient's BMI is 39.  Recent echo showed preserved EF.  No history of stroke. We will set up for a home sleep study.     Rexene Edison, NP 02/16/2020

## 2020-02-16 NOTE — Patient Instructions (Addendum)
Symbicort 160 2 puffs Twice daily  , rinse after use.  Albuterol inhaler As needed  Wheezing  Albuterol Nebs every 6hr as needed for wheezing .  Continue on Singulair 10mg  At bedtime .  Zyrtec 10mg  At bedtime.  Saline nasal spray Laretta Alstrom Twice daily  .  Add Flonase 2 puff daily.  Mucinex DM Twice daily  As needed  Cough/congestion  Pepcid 20mg  Twice daily.  Set Home sleep study .  Chest xray today .  Labs today .  CT chest  in  3 months   Follow up with Dr. Valeta Harms in 2-3 months and As needed   Please contact office for sooner follow up if symptoms do not improve or worsen or seek emergency care

## 2020-02-16 NOTE — Assessment & Plan Note (Addendum)
Recent left lower lobe pneumonia clinically starting to improve.  Follow-up CT chest last month showed patchy left lower lobe opacities.  We will do a follow-up CT in 3 months for clearance.  Patient is on immunosuppression which may be contributing to his recurrent respiratory infections.  Have asked him to discuss this with dermatology who follows him for his lichen planus.   Plan  Patient Instructions  Symbicort 160 2 puffs Twice daily  , rinse after use.  Albuterol inhaler As needed  Wheezing  Albuterol Nebs every 6hr as needed for wheezing .  Continue on Singulair 10mg  At bedtime .  Zyrtec 10mg  At bedtime.  Saline nasal spray Laretta Alstrom Twice daily  .  Add Flonase 2 puff daily.  Mucinex DM Twice daily  As needed  Cough/congestion  Pepcid 20mg  Twice daily.  Set Home sleep study .  Chest xray today .  Labs today .  CT chest  in  3 months   Follow up with Dr. Valeta Harms in 2-3 months and As needed   Please contact office for sooner follow up if symptoms do not improve or worsen or seek emergency care

## 2020-02-17 LAB — IGE: IgE (Immunoglobulin E), Serum: 17 kU/L (ref <=114)

## 2020-02-20 ENCOUNTER — Other Ambulatory Visit (HOSPITAL_COMMUNITY)
Admission: RE | Admit: 2020-02-20 | Discharge: 2020-02-20 | Disposition: A | Discharge status: Home / Self Care | Payer: Medicare HMO | Source: Ambulatory Visit | Attending: Internal Medicine | Admitting: Internal Medicine

## 2020-02-20 ENCOUNTER — Other Ambulatory Visit: Payer: Self-pay | Admitting: Cardiology

## 2020-02-20 ENCOUNTER — Other Ambulatory Visit: Payer: Self-pay

## 2020-02-20 ENCOUNTER — Other Ambulatory Visit: Payer: Medicare HMO | Admitting: *Deleted

## 2020-02-20 DIAGNOSIS — Z01812 Encounter for preprocedural laboratory examination: Secondary | ICD-10-CM | POA: Insufficient documentation

## 2020-02-20 DIAGNOSIS — Z20822 Contact with and (suspected) exposure to covid-19: Secondary | ICD-10-CM | POA: Insufficient documentation

## 2020-02-20 DIAGNOSIS — I719 Aortic aneurysm of unspecified site, without rupture: Secondary | ICD-10-CM

## 2020-02-20 DIAGNOSIS — I340 Nonrheumatic mitral (valve) insufficiency: Secondary | ICD-10-CM

## 2020-02-20 DIAGNOSIS — I7121 Aneurysm of the ascending aorta, without rupture: Secondary | ICD-10-CM

## 2020-02-20 DIAGNOSIS — I4891 Unspecified atrial fibrillation: Secondary | ICD-10-CM

## 2020-02-20 LAB — CBC
Hematocrit: 36.6 % — ABNORMAL LOW (ref 37.5–51.0)
Hemoglobin: 12.6 g/dL — ABNORMAL LOW (ref 13.0–17.7)
MCH: 29.6 pg (ref 26.6–33.0)
MCHC: 34.4 g/dL (ref 31.5–35.7)
MCV: 86 fL (ref 79–97)
Platelets: 279 10*3/uL (ref 150–450)
RBC: 4.26 x10E6/uL (ref 4.14–5.80)
RDW: 14.1 % (ref 11.6–15.4)
WBC: 6.6 10*3/uL (ref 3.4–10.8)

## 2020-02-20 LAB — BASIC METABOLIC PANEL
BUN/Creatinine Ratio: 10 (ref 10–24)
BUN: 11 mg/dL (ref 8–27)
CO2: 21 mmol/L (ref 20–29)
Calcium: 9.1 mg/dL (ref 8.6–10.2)
Chloride: 106 mmol/L (ref 96–106)
Creatinine, Ser: 1.1 mg/dL (ref 0.76–1.27)
GFR calc Af Amer: 78 mL/min/{1.73_m2} (ref >59)
GFR calc non Af Amer: 67 mL/min/{1.73_m2} (ref >59)
Glucose: 98 mg/dL (ref 65–99)
Potassium: 3.7 mmol/L (ref 3.5–5.2)
Sodium: 141 mmol/L (ref 134–144)

## 2020-02-20 LAB — SARS CORONAVIRUS 2 (TAT 6-24 HRS): SARS Coronavirus 2: NEGATIVE

## 2020-02-23 ENCOUNTER — Encounter (HOSPITAL_COMMUNITY): Payer: Self-pay | Admitting: Internal Medicine

## 2020-02-23 ENCOUNTER — Ambulatory Visit (HOSPITAL_COMMUNITY): Payer: Medicare HMO | Admitting: Anesthesiology

## 2020-02-23 ENCOUNTER — Ambulatory Visit (HOSPITAL_COMMUNITY)
Admission: RE | Admit: 2020-02-23 | Discharge: 2020-02-23 | Disposition: A | Discharge status: Home / Self Care | Payer: Medicare HMO | Attending: Internal Medicine | Admitting: Internal Medicine

## 2020-02-23 ENCOUNTER — Encounter (HOSPITAL_COMMUNITY)
Admission: RE | Disposition: A | Discharge status: Home / Self Care | Payer: Self-pay | Source: Home / Self Care | Attending: Internal Medicine

## 2020-02-23 ENCOUNTER — Other Ambulatory Visit: Payer: Self-pay

## 2020-02-23 DIAGNOSIS — Z79890 Hormone replacement therapy (postmenopausal): Secondary | ICD-10-CM | POA: Diagnosis not present

## 2020-02-23 DIAGNOSIS — E079 Disorder of thyroid, unspecified: Secondary | ICD-10-CM | POA: Insufficient documentation

## 2020-02-23 DIAGNOSIS — Z7901 Long term (current) use of anticoagulants: Secondary | ICD-10-CM | POA: Diagnosis not present

## 2020-02-23 DIAGNOSIS — I340 Nonrheumatic mitral (valve) insufficiency: Secondary | ICD-10-CM | POA: Insufficient documentation

## 2020-02-23 DIAGNOSIS — I4891 Unspecified atrial fibrillation: Secondary | ICD-10-CM | POA: Insufficient documentation

## 2020-02-23 DIAGNOSIS — I4819 Other persistent atrial fibrillation: Secondary | ICD-10-CM | POA: Diagnosis not present

## 2020-02-23 DIAGNOSIS — Z87891 Personal history of nicotine dependence: Secondary | ICD-10-CM | POA: Diagnosis not present

## 2020-02-23 DIAGNOSIS — L439 Lichen planus, unspecified: Secondary | ICD-10-CM | POA: Insufficient documentation

## 2020-02-23 DIAGNOSIS — Z79899 Other long term (current) drug therapy: Secondary | ICD-10-CM | POA: Diagnosis not present

## 2020-02-23 DIAGNOSIS — Z7951 Long term (current) use of inhaled steroids: Secondary | ICD-10-CM | POA: Insufficient documentation

## 2020-02-23 HISTORY — PX: CARDIOVERSION: SHX1299

## 2020-02-23 SURGERY — CARDIOVERSION
Anesthesia: Monitor Anesthesia Care

## 2020-02-23 MED ORDER — SODIUM CHLORIDE 0.9 % IV SOLN
INTRAVENOUS | Status: AC | PRN
  Administered 2020-02-23: 500 mL via INTRAVENOUS

## 2020-02-23 MED ORDER — LIDOCAINE 2% (20 MG/ML) 5 ML SYRINGE
INTRAMUSCULAR | Status: DC | PRN
  Administered 2020-02-23: 60 mg via INTRAVENOUS

## 2020-02-23 MED ORDER — PROPOFOL 10 MG/ML IV BOLUS
INTRAVENOUS | Status: DC | PRN
  Administered 2020-02-23: 60 mg via INTRAVENOUS

## 2020-02-23 MED ORDER — PHENYLEPHRINE 40 MCG/ML (10ML) SYRINGE FOR IV PUSH (FOR BLOOD PRESSURE SUPPORT)
PREFILLED_SYRINGE | INTRAVENOUS | Status: DC | PRN
  Administered 2020-02-23: 120 ug via INTRAVENOUS

## 2020-02-23 NOTE — Anesthesia Procedure Notes (Signed)
Procedure Name: MAC Date/Time: 02/23/2020 7:58 AM Performed by: Barrington Ellison, CRNA Pre-anesthesia Checklist: Patient identified, Emergency Drugs available, Suction available, Patient being monitored and Timeout performed Patient Re-evaluated:Patient Re-evaluated prior to induction Oxygen Delivery Method: Ambu bag

## 2020-02-23 NOTE — H&P (Signed)
Cardiology Admission History and Physical:   Patient ID: Todd Harrison MRN: [id number]; DOB: 1946/04/02   Admission date: 02/23/2020  Primary Care Provider: Lorene Dy, MD [facility] Cardiologist: Freada Bergeron, MD   Chief Complaint:  Afib  Patient Profile:   Todd Harrison is a 74 y.o. male with recent PNA  Who subsequently developed atrial fibrillation.  History of Present Illness:   [name] is presently in atrial fibrillation.  Denied Palpitations.  Has mild ot moderate mitral regurgitation.  Just feels fatigue and doesn't want to do anything.   Past Medical History:  Diagnosis Date   Lichen planus    Methotrexate, long term, current use    Psoriasis    Thyroid disease     Past Surgical History:  Procedure Laterality Date   CATARACT EXTRACTION Left    2019     Medications Prior to Admission: Prior to Admission medications   Medication Sig Start Date End Date Taking? Authorizing Provider  apixaban (ELIQUIS) 5 MG TABS tablet Take 1 tablet (5 mg total) by mouth 2 (two) times daily. 01/27/20  Yes Oswald Hillock, MD  B Complex Vitamins (VITAMIN B COMPLEX PO) Take 1 tablet by mouth daily.   Yes [provider]  betamethasone dipropionate (DIPROLENE) 0.05 % ointment Apply 1 application topically 2 (two) times daily.    Yes [provider]  budesonide-formoterol (SYMBICORT) 160-4.5 MCG/ACT inhaler Inhale 2 puffs into the lungs 2 (two) times daily. 01/08/20  Yes Parrett, Tammy S, NP  diltiazem (CARDIZEM CD) 300 MG 24 hr capsule Take 1 capsule (300 mg total) by mouth daily. 01/28/20  Yes Oswald Hillock, MD  famotidine (PEPCID) 20 MG tablet Take 20 mg by mouth 2 (two) times daily.   Yes [provider]  guaiFENesin (MUCINEX) 600 MG 12 hr tablet Take 2 tablets (1,200 mg total) by mouth 2 (two) times daily as needed. 01/27/20  Yes Oswald Hillock, MD  ISOtretinoin (ACCUTANE) 40 MG capsule Take 40 mg by mouth 2 (two) times daily.  01/13/20  Yes  [provider]  levothyroxine (SYNTHROID, LEVOTHROID) 100 MCG tablet Take 100 mcg by mouth daily before breakfast.  05/06/11  Yes [provider]  mometasone-formoterol (DULERA) 100-5 MCG/ACT AERO Inhale 2 puffs into the lungs 2 (two) times daily as needed for wheezing.   Yes [provider]  montelukast (SINGULAIR) 10 MG tablet TAKE 1 TABLET BY MOUTH EVERYDAY AT BEDTIME Patient taking differently: Take 10 mg by mouth at bedtime.  01/07/20  Yes Maximiano Coss, NP  Multiple Minerals-Vitamins (CALCIUM-MAGNESIUM-ZINC-D3) TABS Take 1 tablet by mouth daily.   Yes [provider]  mycophenolate (CELLCEPT) 500 MG tablet Take 1,500 mg by mouth 2 (two) times daily.    Yes [provider]  nebivolol (BYSTOLIC) 10 MG tablet Take 1 tablet (10 mg total) by mouth daily. 01/28/20  Yes Darrick Meigs, Marge Duncans, MD  albuterol (VENTOLIN HFA) 108 (90 Base) MCG/ACT inhaler Inhale 2 puffs into the lungs every 6 (six) hours as needed for wheezing or shortness of breath. 10/17/19   Posey Boyer, MD  loratadine (CLARITIN) 10 MG tablet Take 10 mg by mouth daily.     [provider]  meclizine (ANTIVERT) 25 MG tablet Take 25 mg by mouth 2 (two) times daily.     [provider]     Allergies:    Allergies  Allergen Reactions   Gold Itching   Mixed Grasses     unknown  Social History:   Social History   Socioeconomic History   Marital status: Married    Spouse name: Not on file   Number of children: Not on file   Years of education: Not on file   Highest education level: Not on file  Occupational History   Not on file  Tobacco Use   Smoking status: Former Smoker    Types: Cigarettes    Quit date: 1982    Years since quitting: 39.8   Smokeless tobacco: Never Used  Scientific laboratory technician Use: Never assessed  Substance and Sexual Activity   Alcohol use: Yes    Alcohol/week: 1.0 standard drink    Types: 1 Glasses of wine per week   Drug use: No    Sexual activity: Not on file  Other Topics Concern   Not on file  Social History Narrative   Not on file   Social Determinants of Health   Financial Resource Strain:    Difficulty of Paying Living Expenses: Not on file  Food Insecurity:    Worried About Parchment in the Last Year: Not on file   Ran Out of Food in the Last Year: Not on file  Transportation Needs:    Lack of Transportation (Medical): Not on file   Lack of Transportation (Non-Medical): Not on file  Physical Activity:    Days of Exercise per Week: Not on file   Minutes of Exercise per Session: Not on file  Stress:    Feeling of Stress : Not on file  Social Connections:    Frequency of Communication with Friends and Family: Not on file   Frequency of Social Gatherings with Friends and Family: Not on file   Attends Religious Services: Not on file   Active Member of Clubs or Organizations: Not on file   Attends Archivist Meetings: Not on file   Marital Status: Not on file  Intimate Partner Violence:    Fear of Current or Ex-Partner: Not on file   Emotionally Abused: Not on file   Physically Abused: Not on file   Sexually Abused: Not on file    Family History:   The patient's family history includes Diabetes in his mother; Stroke in his mother.    ROS:  Please see the history of present illness.  All other ROS reviewed and negative.     Physical Exam/Data:   Vitals:   02/23/20 0735  BP: 96/66  Pulse: 68  Resp: 20  Temp: 98.1 F (36.7 C)  TempSrc: Oral  SpO2: 95%  Weight: 131.1 kg  Height: 6' (1.829 m)   No intake or output data in the 24 hours ending 02/23/20 0754 Last 3 Weights 02/23/2020 02/16/2020 02/05/2020  Weight (lbs) 289 lb 289 lb 294 lb 12.8 oz  Weight (kg) 131.09 kg 131.09 kg 133.72 kg     Body mass index is 39.2 kg/m.  General:  Obese well developed, in no acute distress HEENT: normal Neck: no JVD Cardiac:  normal S1, S2; irregularly irregular; no murmur  Lungs:   clear to auscultation bilaterally, no wheezing, rhonchi or rales  Abd: soft, nontender, no hepatomegaly  Musculoskeletal:  No deformities, BUE and BLE strength normal and equal Skin: warm and dry  Neuro:  CNs 2-12 intact, no focal abnormalities noted Psych:  Normal affect   Laboratory Data:  Chemistry Recent Labs  Lab 02/20/20 0858  NA 141  K 3.7  CL 106  CO2 21  GLUCOSE 98  BUN 11  CREATININE 1.10  CALCIUM 9.1  GFRNONAA 67  GFRAA 78    Hematology Recent Labs  Lab 02/16/20 1155 02/20/20 0858  WBC 8.6 6.6  RBC 4.31 4.26  HGB 12.6* 12.6*  HCT 38.4* 36.6*  MCV 89.0 86  MCH  --  29.6  MCHC 33.0 34.4  RDW 15.0 14.1  PLT 256.0 279    Assessment and Plan:       [facility] has been requested to perform a cardiversion on Burnard Hawthorne for atrial fibrillation..  After careful review of history and examination, the risks and benefits o including aspiration, arrhythmia, respiratory failure and death. Alternatives to treatment were discussed, questions were answered. Patient is willing to proceed.   Werner Lean, MD  02/23/2020 8:02 AM    For questions or updates, please contact [place] Please consult [email or web address] for contact info under     Signed, Werner Lean, MD  02/23/2020 7:54 AM

## 2020-02-23 NOTE — Anesthesia Preprocedure Evaluation (Signed)
Anesthesia Evaluation  Patient identified by MRN, date of birth, ID band Patient awake    Reviewed: Allergy & Precautions, NPO status , Patient's Chart, lab work & pertinent test results  History of Anesthesia Complications Negative for: history of anesthetic complications  Airway Mallampati: II  TM Distance: >3 FB Neck ROM: Full    Dental   Pulmonary neg pulmonary ROS, former smoker,    Pulmonary exam normal        Cardiovascular + dysrhythmias  Rhythm:Irregular Rate:Normal     Neuro/Psych negative neurological ROS  negative psych ROS   GI/Hepatic negative GI ROS, Neg liver ROS,   Endo/Other  Hypothyroidism   Renal/GU negative Renal ROS  negative genitourinary   Musculoskeletal negative musculoskeletal ROS (+)   Abdominal   Peds  Hematology negative hematology ROS (+)   Anesthesia Other Findings  EF 55-60%, mild-mod MR, AV sclerosis w/o stenosis, mild aortic root dilatation (42 mm)  Reproductive/Obstetrics                             Anesthesia Physical Anesthesia Plan  ASA: III  Anesthesia Plan: General   Post-op Pain Management:    Induction: Intravenous  PONV Risk Score and Plan: 2 and TIVA and Treatment may vary due to age or medical condition  Airway Management Planned: Mask  Additional Equipment: None  Intra-op Plan:   Post-operative Plan:   Informed Consent: I have reviewed the patients History and Physical, chart, labs and discussed the procedure including the risks, benefits and alternatives for the proposed anesthesia with the patient or authorized representative who has indicated his/her understanding and acceptance.       Plan Discussed with:   Anesthesia Plan Comments:         Anesthesia Quick Evaluation

## 2020-02-23 NOTE — Transfer of Care (Signed)
Immediate Anesthesia Transfer of Care Note  Patient: Todd Harrison  Procedure(s) Performed: CARDIOVERSION (N/A )  Patient Location: Endoscopy Unit  Anesthesia Type:General  Level of Consciousness: drowsy and responds to stimulation  Airway & Oxygen Therapy: Patient Spontanous Breathing  Post-op Assessment: Report given to RN  Post vital signs: Reviewed and stable  Last Vitals:  Vitals Value Taken Time  BP    Temp    Pulse    Resp    SpO2      Last Pain:  Vitals:   02/23/20 0735  TempSrc: Oral  PainSc: 0-No pain         Complications: No complications documented.

## 2020-02-23 NOTE — Anesthesia Postprocedure Evaluation (Signed)
Anesthesia Post Note  Patient: Todd Harrison  Procedure(s) Performed: CARDIOVERSION (N/A )     Patient location during evaluation: Endoscopy Anesthesia Type: MAC Level of consciousness: awake and alert Pain management: pain level controlled Vital Signs Assessment: post-procedure vital signs reviewed and stable Respiratory status: spontaneous breathing, nonlabored ventilation and respiratory function stable Cardiovascular status: blood pressure returned to baseline and stable Postop Assessment: no apparent nausea or vomiting Anesthetic complications: no   No complications documented.  Last Vitals:  Vitals:   02/23/20 0817 02/23/20 0829  BP: (!) 85/64 98/70  Pulse: (!) 58 63  Resp: 16 16  Temp:    SpO2: 95% 97%    Last Pain:  Vitals:   02/23/20 0829  TempSrc:   PainSc: 0-No pain                 Lidia Collum

## 2020-02-23 NOTE — CV Procedure (Signed)
   Electrical Cardioversion Procedure Note Todd Harrison [id number] Jan 05, 1946  Procedure: Electrical Cardioversion Indications:  Atrial Fibrillation  Time Out: Verified patient identification, verified procedure,medications/allergies/relevent history reviewed, required imaging and test results available.  Performed  Procedure Details  The patient was NPO after midnight. Anesthesia was administered at the beside  by Dr.Whitman with 60 mg of propofol.  Cardioversion was done with synchronized biphasic defibrillation with AP pads with 20 Jwatts.  The patient converted to normal sinus bradycardia, but quickly returned to atrial fibrillation. The patient tolerated the procedure well   IMPRESSION:  Cardioversion of atrial fibrillation with quick return back to atrial fibrillation.    Todd Harrison A Todd Harrison 02/23/2020, 8:12 AM

## 2020-02-24 ENCOUNTER — Other Ambulatory Visit: Payer: Self-pay | Admitting: Cardiology

## 2020-02-24 MED ORDER — APIXABAN 5 MG PO TABS
5.0000 mg | ORAL_TABLET | Freq: Two times a day (BID) | ORAL | 3 refills | Status: DC
Start: 2020-02-24 — End: 2021-03-16

## 2020-02-24 MED ORDER — NEBIVOLOL HCL 10 MG PO TABS
10.0000 mg | ORAL_TABLET | Freq: Every day | ORAL | 3 refills | Status: DC
Start: 2020-02-24 — End: 2020-02-25

## 2020-02-24 NOTE — Telephone Encounter (Signed)
Eliquis 5mg  refill request received. Patient is 74 years old, weight-131.1kg, Crea-1.10 on 02/20/2020, Diagnosis-Afib, and last seen by Dr. Johney Frame on 02/05/20. Dose is appropriate based on dosing criteria. Will send in refill to requested pharmacy.

## 2020-02-24 NOTE — Telephone Encounter (Signed)
°*  STAT* If patient is at the pharmacy, call can be transferred to refill team.   1. Which medications need to be refilled? (please list name of each medication and dose if known) nebivolol (BYSTOLIC) 10 MG tablet / apixaban (ELIQUIS) 5 MG TABS tablet  2. Which pharmacy/location (including street and city if local pharmacy) is medication to be sent to? [id number] - [place], [place] [address]  3. Do they need a 30 day or 90 day supply? 90   These were prescribed in the hospital, he wants to see if Dr. Johney Frame will prescribe for him.

## 2020-02-24 NOTE — Telephone Encounter (Signed)
Pt's medication was sent to pt's pharmacy as requested. Confirmation received.  °

## 2020-02-25 ENCOUNTER — Other Ambulatory Visit: Payer: Self-pay | Admitting: Cardiology

## 2020-02-25 ENCOUNTER — Encounter (HOSPITAL_COMMUNITY): Payer: Self-pay | Admitting: Internal Medicine

## 2020-02-25 ENCOUNTER — Telehealth: Payer: Self-pay | Admitting: Pharmacist

## 2020-02-25 MED ORDER — METOPROLOL SUCCINATE ER 50 MG PO TB24: 50.0000 mg | ORAL_TABLET | Freq: Every day | ORAL | 3 refills | Status: DC

## 2020-02-25 NOTE — Telephone Encounter (Signed)
Called pt to relay message from pharmacy regarding Eliquis pricing. 1 month copay is $55 compared to 3 month copay of $400. Pt is ok with 1 month copay, will also set some samples up front for pt.  He will look into other Part D plans for next year as [place] is typically associated with higher deductibles ~$450 compared to other plans.  His Bystolic is still expensive at $200 for a month supply and his insurance does not cover the generic. Bystolic to be changed to alternative/cheaper beta blocker per MD.  Pt was very appreciative of the call.

## 2020-03-02 ENCOUNTER — Other Ambulatory Visit: Payer: Self-pay

## 2020-03-02 ENCOUNTER — Encounter: Payer: Self-pay | Admitting: Primary Care

## 2020-03-02 ENCOUNTER — Ambulatory Visit: Payer: Medicare HMO | Admitting: Primary Care

## 2020-03-02 ENCOUNTER — Other Ambulatory Visit: Payer: Medicare HMO

## 2020-03-02 ENCOUNTER — Ambulatory Visit (INDEPENDENT_AMBULATORY_CARE_PROVIDER_SITE_OTHER): Payer: Medicare HMO

## 2020-03-02 ENCOUNTER — Telehealth: Payer: Self-pay | Admitting: Pulmonary Disease

## 2020-03-02 VITALS — BP 128/74 | HR 88 | Ht 72.0 in | Wt 296.8 lb

## 2020-03-02 DIAGNOSIS — J189 Pneumonia, unspecified organism: Secondary | ICD-10-CM

## 2020-03-02 DIAGNOSIS — J4541 Moderate persistent asthma with (acute) exacerbation: Secondary | ICD-10-CM

## 2020-03-02 MED ORDER — BENZONATATE 200 MG PO CAPS: 200.0000 mg | ORAL_CAPSULE | Freq: Three times a day (TID) | ORAL | 1 refills | Status: DC | PRN

## 2020-03-02 MED ORDER — PREDNISONE 10 MG PO TABS: ORAL_TABLET | ORAL | 0 refills | Status: DC

## 2020-03-02 NOTE — Telephone Encounter (Signed)
I am happy to see him next week in clinic. Or if he feels like he needs to be seen sooner maybe an APP this week. He probably needs a repeat CXR if he is still having respiratory symptoms. I would take either symibort or dulera, not both.  Thanks  Garner Nash, DO [facility] 03/02/2020 9:19 AM

## 2020-03-02 NOTE — Progress Notes (Signed)
@Patient  ID: Todd Harrison, male    DOB: 19-Mar-1946, 74 y.o.   MRN: [id number]  Chief Complaint  Patient presents with  . Acute Visit    Chest xray performed today. Pt has had complaints of cough and chest tightness which has been worse for about 1 week. Pt is coughing up white to green phlegm. Denies any complaints of fever.    Referring provider: Lorene Dy, MD  HPI: 74 year old male, former smoker quit 1982.  Past medical history significant for A. fib, asthmatic bronchitis, pneumonia, hypothyroidism, GERD, lichen planus, long-term use methotrexate.  Patient of Dr. Valeta Harms, last seen by pulmonary nurse practitioner on 02/16/2020.  Patient was seen in the emergency room on January 01, 2020 for persistent cough x9 months.    Chest x-ray showed new reticular nodular opacity along left mid lower lobe suspicious for infection.  Increased diffuse interstitial prominence.  Covid testing with negative.  He was treated with Levaquin 750 mg x 7 days and prednisone taper.  03/02/2020-interim history Patient presents today for an acute office visit with reports of increased chest congestion, cough and wheezing. Patient was treated for pneumonia end of August 2021 with Levaquin.  Symptoms were slow to resolve.  During last visit he was doing some better.  Patient reports he started feeling back to baseline last Monday 02/23/2020, symptoms had almost completely resolved. He developed congested cough and wheezing several days ago. Cough is productive with clear-green mucus. Associated sinus congestion.  He is compliant with Symbicort, Mucinex, Singulair, Zyrtec, saline nasal spray, Flonase and Pepcid.  He is immunosuppressed due to being on CellCept for Lichen plaus. Follow-up CT chest last month showed patchy left lower lobe opacities.  Plan follow-up CT in 3 months for clearance.     TEST/EVENTS :  June 12, 2019 that showed normal lung function with FEV1 at 87%, ratio 76, FVC 84%. No significant  bronchodilator response. DLCO was 87%.  Chest x-ray done October 17, 2019 showed no acute process. CT sinus December 06, 2019 paranasal sinuses well aerated. No evidence of sinusitis. Nasal septal deviation leftward  Sputum culture October 24, 2019 growth of normal oral flora.  2D echo January 23, 2020 showed EF 55 to 31%, RV systolic function is normal.  RV size is normal.  Moderate calcification of the aortic valve and thickening.  Mild to moderate aortic valve sclerosis.  No evidence of aortic stenosis.   Allergies  Allergen Reactions  . Gold Itching  . Mixed Grasses     unknown    Immunization History  Administered Date(s) Administered  . Fluad Quad(high Dose 65+) 03/04/2019, 01/25/2020  . Influenza-Unspecified 03/08/2016  . PFIZER SARS-COV-2 Vaccination 06/13/2019, 07/04/2019  . Pneumococcal Polysaccharide-23 01/25/2020    Past Medical History:  Diagnosis Date  . Lichen planus   . Methotrexate, long term, current use   . Psoriasis   . Thyroid disease     Tobacco History: Social History   Tobacco Use  Smoking Status Former Smoker  . Types: Cigarettes  . Quit date: 31  . Years since quitting: 39.9  Smokeless Tobacco Never Used   Counseling given: Not Answered   Outpatient Medications Prior to Visit  Medication Sig Dispense Refill  . albuterol (PROVENTIL) (2.5 MG/3ML) 0.083% nebulizer solution Take 2.5 mg by nebulization every 6 (six) hours as needed for wheezing or shortness of breath.    Marland Kitchen albuterol (VENTOLIN HFA) 108 (90 Base) MCG/ACT inhaler Inhale 2 puffs into the lungs every 6 (six) hours as needed  for wheezing or shortness of breath. 18 g 3  . apixaban (ELIQUIS) 5 MG TABS tablet Take 1 tablet (5 mg total) by mouth 2 (two) times daily. 180 tablet 3  . B Complex Vitamins (VITAMIN B COMPLEX PO) Take 1 tablet by mouth daily.    . betamethasone dipropionate (DIPROLENE) 0.05 % ointment Apply 1 application topically 2 (two) times daily.     .  budesonide-formoterol (SYMBICORT) 160-4.5 MCG/ACT inhaler Inhale 2 puffs into the lungs 2 (two) times daily. 1 each 12  . famotidine (PEPCID) 20 MG tablet Take 20 mg by mouth 2 (two) times daily.    Marland Kitchen guaiFENesin (MUCINEX) 600 MG 12 hr tablet Take 2 tablets (1,200 mg total) by mouth 2 (two) times daily as needed. (Patient taking differently: Take 600 mg by mouth daily. ) 20 tablet 0  . ISOtretinoin (ACCUTANE) 40 MG capsule Take 40 mg by mouth 2 (two) times daily.     Marland Kitchen levothyroxine (SYNTHROID, LEVOTHROID) 100 MCG tablet Take 100 mcg by mouth daily before breakfast.     . loratadine (CLARITIN) 10 MG tablet Take 10 mg by mouth daily.     . metoprolol succinate (TOPROL-XL) 50 MG 24 hr tablet Take 1 tablet (50 mg total) by mouth daily. Take with or immediately following a meal. 90 tablet 3  . montelukast (SINGULAIR) 10 MG tablet TAKE 1 TABLET BY MOUTH EVERYDAY AT BEDTIME (Patient taking differently: Take 10 mg by mouth at bedtime. ) 90 tablet 1  . mycophenolate (CELLCEPT) 500 MG tablet Take 1,000 mg by mouth 2 (two) times daily.     Marland Kitchen diltiazem (CARDIZEM CD) 300 MG 24 hr capsule Take 1 capsule (300 mg total) by mouth daily. 60 capsule 2  . mometasone-formoterol (DULERA) 100-5 MCG/ACT AERO Inhale 2 puffs into the lungs 2 (two) times daily as needed for wheezing.  (Patient not taking: Reported on 03/18/2020)    . meclizine (ANTIVERT) 25 MG tablet Take 25 mg by mouth 2 (two) times daily.     . Multiple Minerals-Vitamins (CALCIUM-MAGNESIUM-ZINC-D3) TABS Take 1 tablet by mouth daily.      No facility-administered medications prior to visit.    Review of Systems  Review of Systems  HENT: Positive for congestion and postnasal drip.   Respiratory: Positive for cough.     Physical Exam  BP 128/74 (BP Location: Left Arm, Cuff Size: Large)   Pulse 88   Ht 6' (1.829 m)   Wt 296 lb 12.8 oz (134.6 kg)   SpO2 95%   BMI 40.25 kg/m  Physical Exam Constitutional:      Appearance: Normal appearance.    HENT:     Head: Normocephalic and atraumatic.     Mouth/Throat:     Mouth: Mucous membranes are moist.     Pharynx: Oropharynx is clear. No oropharyngeal exudate or posterior oropharyngeal erythema.  Cardiovascular:     Rate and Rhythm: Normal rate and regular rhythm.  Pulmonary:     Effort: Pulmonary effort is normal.     Breath sounds: Wheezing and rhonchi present.  Skin:    General: Skin is warm and dry.  Neurological:     General: No focal deficit present.     Mental Status: He is alert and oriented to person, place, and time. Mental status is at baseline.  Psychiatric:        Mood and Affect: Mood normal.        Behavior: Behavior normal.        Thought Content:  Thought content normal.        Judgment: Judgment normal.      Lab Results:  CBC    Component Value Date/Time   WBC 6.6 02/20/2020 0858   WBC 8.6 02/16/2020 1155   RBC 4.26 02/20/2020 0858   RBC 4.31 02/16/2020 1155   HGB 12.6 (L) 02/20/2020 0858   HCT 36.6 (L) 02/20/2020 0858   PLT 279 02/20/2020 0858   MCV 86 02/20/2020 0858   MCH 29.6 02/20/2020 0858   MCH 29.1 01/23/2020 0535   MCHC 34.4 02/20/2020 0858   MCHC 33.0 02/16/2020 1155   RDW 14.1 02/20/2020 0858   LYMPHSABS 1.7 02/16/2020 1155   LYMPHSABS 1.4 01/01/2020 1359   MONOABS 0.9 02/16/2020 1155   EOSABS 0.1 02/16/2020 1155   EOSABS 0.1 01/01/2020 1359   BASOSABS 0.1 02/16/2020 1155   BASOSABS 0.1 01/01/2020 1359    BMET    Component Value Date/Time   NA 138 03/18/2020 1200   NA 141 02/20/2020 0858   K 4.4 03/18/2020 1200   CL 104 03/18/2020 1200   CO2 26 03/18/2020 1200   GLUCOSE 100 (H) 03/18/2020 1200   BUN 14 03/18/2020 1200   BUN 11 02/20/2020 0858   CREATININE 1.20 03/18/2020 1200   CALCIUM 9.1 03/18/2020 1200   GFRNONAA >60 03/18/2020 1200   GFRAA 78 02/20/2020 0858    BNP    Component Value Date/Time   BNP 184.6 (H) 01/22/2020 1322    ProBNP No results found for: PROBNP  Imaging: DG Chest 2 View  Result  Date: 03/02/2020 CLINICAL DATA:  Pneumonia EXAM: CHEST - 2 VIEW COMPARISON:  None. FINDINGS: Normal mediastinum and cardiac silhouette. Normal pulmonary vasculature. No evidence of effusion, infiltrate, or pneumothorax. No acute bony abnormality. Degenerative osteophytosis of the spine. IMPRESSION: No acute cardiopulmonary process. Electronically Signed   By: Suzy Bouchard M.D.   On: 03/02/2020 10:34     Assessment & Plan:   Asthmatic bronchitis Dx with Pneumonia in late August 2021 treated with Levaquin. Symptoms were slow to resolve but reported feeling back to baseline on 02/23/20. He then developed increased cough and wheezing several days ago. He is compliant with Symbicort, Mucinex, Singulair and Zyrtec. CXR today showed no acute cardiopulmonary process. Plan sending in prednisone taper and checking sputum samples. Patient to follow-up if symptoms do not improve or worsen.  Due for repeat CT chest in January 2022 to follow-up on LLL opacities.   Martyn Ehrich, NP 03/23/2020

## 2020-03-02 NOTE — Telephone Encounter (Signed)
Spoke with the pt  He states does not feel he ever really got back to 100% better after having PNA 01/22/20 He states he has continued to have cough and hoarseness since then  He feels worse the past wk with chest tightness and coughing up large amounts of white sputum, occ mixed with yellow He states symptoms are not too bad in the am, but worsen throughout the day  He denies any f/c/s, body aches  Has been covid vaccinated  He states taking all meds as prescribed- was given Dulera in the hospital and is also taking Symbicort  I advised will find out recs from Dr Valeta Harms as to what inhaler to take and any other recs, thanks!

## 2020-03-02 NOTE — Patient Instructions (Addendum)
Asthma: - Continue Symbicort two puffs twice daily - Continue Albuterol 2 puffs every 4-6 hours as needed for breakthrough shortness of breath or wheezing   Sinusitis:  - Continue Saline nasal rinses twice daily  - Continue Flonase nasal spray once daily  - Continue Zyrtec 10mg  daily  - Continue Singulair 10mg  at bedtime  - Continue Mucinex DM Twice daily  As needed  Cough/congestion   GERD: - Continue Pepcid 20mg  Twice daily  Orders: - Sputum sample (ordered)  Rx: - Tesslon perles  - Prednisone taper as directed   Follow-up: - If symptoms do not improve or worsen  - Due for CT chest in January 2022 (already ordered)   Sinusitis, Adult Sinusitis is soreness and swelling (inflammation) of your sinuses. Sinuses are hollow spaces in the bones around your face. They are located:  Around your eyes.  In the middle of your forehead.  Behind your nose.  In your cheekbones. Your sinuses and nasal passages are lined with a fluid called mucus. Mucus drains out of your sinuses. Swelling can trap mucus in your sinuses. This lets germs (bacteria, virus, or fungus) grow, which leads to infection. Most of the time, this condition is caused by a virus. What are the causes? This condition is caused by:  Allergies.  Asthma.  Germs.  Things that block your nose or sinuses.  Growths in the nose (nasal polyps).  Chemicals or irritants in the air.  Fungus (rare). What increases the risk? You are more likely to develop this condition if:  You have a weak body defense system (immune system).  You do a lot of swimming or diving.  You use nasal sprays too much.  You smoke. What are the signs or symptoms? The main symptoms of this condition are pain and a feeling of pressure around the sinuses. Other symptoms include:  Stuffy nose (congestion).  Runny nose (drainage).  Swelling and warmth in the sinuses.  Headache.  Toothache.  A cough that may get worse at  night.  Mucus that collects in the throat or the back of the nose (postnasal drip).  Being unable to smell and taste.  Being very tired (fatigue).  A fever.  Sore throat.  Bad breath. How is this diagnosed? This condition is diagnosed based on:  Your symptoms.  Your medical history.  A physical exam.  Tests to find out if your condition is short-term (acute) or long-term (chronic). Your doctor may: ? Check your nose for growths (polyps). ? Check your sinuses using a tool that has a light (endoscope). ? Check for allergies or germs. ? Do imaging tests, such as an MRI or CT scan. How is this treated? Treatment for this condition depends on the cause and whether it is short-term or long-term.  If caused by a virus, your symptoms should go away on their own within 10 days. You may be given medicines to relieve symptoms. They include: ? Medicines that shrink swollen tissue in the nose. ? Medicines that treat allergies (antihistamines). ? A spray that treats swelling of the nostrils. ? Rinses that help get rid of thick mucus in your nose (nasal saline washes).  If caused by bacteria, your doctor may wait to see if you will get better without treatment. You may be given antibiotic medicine if you have: ? A very bad infection. ? A weak body defense system.  If caused by growths in the nose, you may need to have surgery. Follow these instructions at home: Medicines  Take, use, or apply over-the-counter and prescription medicines only as told by your doctor. These may include nasal sprays.  If you were prescribed an antibiotic medicine, take it as told by your doctor. Do not stop taking the antibiotic even if you start to feel better. Hydrate and humidify   Drink enough water to keep your pee (urine) pale yellow.  Use a cool mist humidifier to keep the humidity level in your home above 50%.  Breathe in steam for 10-15 minutes, 3-4 times a day, or as told by your doctor.  You can do this in the bathroom while a hot shower is running.  Try not to spend time in cool or dry air. Rest  Rest as much as you can.  Sleep with your head raised (elevated).  Make sure you get enough sleep each night. General instructions   Put a warm, moist washcloth on your face 3-4 times a day, or as often as told by your doctor. This will help with discomfort.  Wash your hands often with soap and water. If there is no soap and water, use hand sanitizer.  Do not smoke. Avoid being around people who are smoking (secondhand smoke).  Keep all follow-up visits as told by your doctor. This is important. Contact a doctor if:  You have a fever.  Your symptoms get worse.  Your symptoms do not get better within 10 days. Get help right away if:  You have a very bad headache.  You cannot stop throwing up (vomiting).  You have very bad pain or swelling around your face or eyes.  You have trouble seeing.  You feel confused.  Your neck is stiff.  You have trouble breathing. Summary  Sinusitis is swelling of your sinuses. Sinuses are hollow spaces in the bones around your face.  This condition is caused by tissues in your nose that become inflamed or swollen. This traps germs. These can lead to infection.  If you were prescribed an antibiotic medicine, take it as told by your doctor. Do not stop taking it even if you start to feel better.  Keep all follow-up visits as told by your doctor. This is important. This information is not intended to replace advice given to you by your health care provider. Make sure you discuss any questions you have with your health care provider. Document Revised: 09/24/2017 Document Reviewed: 09/24/2017 Elsevier Patient Education  [place].

## 2020-03-02 NOTE — Telephone Encounter (Signed)
Spoke with the pt  I have scheduled him to see Beth today at 10:30

## 2020-03-05 LAB — RESPIRATORY CULTURE OR RESPIRATORY AND SPUTUM CULTURE
MICRO NUMBER:: 11119733
RESULT:: NORMAL
SPECIMEN QUALITY:: ADEQUATE

## 2020-03-09 NOTE — Progress Notes (Signed)
Cardiology Office Note:    Date:  03/11/2020   ID:  Todd Harrison, DOB 15-Mar-1946, MRN [id number]  PCP:  Lorene Dy, MD  [facility] Cardiologist:  Freada Bergeron, MD  [facility] Electrophysiologist:  None   Referring MD: Lorene Dy, MD    History of Present Illness:    Todd Harrison is a 74 y.o. male with a hx of lichen planus on methotrexate and chronic prednisone, hypothyroidism, and newly diagnosed Afib s/p DCCV with initial return to SR but quickly reverted back to Afib now presenting for follow-up.  Patient was diagnosed with left sided pneumonia and acute asthma exacerbation about 1.5 months ago. He was treated with medrol dose pack and levaquin with modest improvement. He continued to have a productive cough and was following up with his pulmonologist when he was noted to be in Afib with RVR to 130s for which he was transferred to the ED.  During his hospitalization, the patient was rate controlled initially with a dilt gtt later transitioned to cardizem and bystolic. He was initiated on eliquis for anticoagulation with plans to DCCV once he received 3 weeks of AC.  He returned for DCCV on 10/18 where he initially converted to NSR but rapidly returned back into Afib. He returns to clinic today to discuss further.  Patient continues to have significant coughing with sputum production that is worse at the end of the day and through the night. No blood in sputum. Has been following with pulm and ENT. Has not been able to sleep due to this. He is on robust medical management for asthma, GERD, allergies and cough. Not on ACE/ARB.  TTE 09/17: Normal EF, mild-moderate MR, calcified AoV without stenosis, aortic root 4.2cm  Past Medical History:  Diagnosis Date  . Lichen planus   . Methotrexate, long term, current use   . Psoriasis   . Thyroid disease     Past Surgical History:  Procedure Laterality Date  . CARDIOVERSION N/A 02/23/2020   Procedure:  CARDIOVERSION;  Surgeon: Werner Lean, MD;  Location: [facility] ENDOSCOPY;  Service: Cardiovascular;  Laterality: N/A;  . CATARACT EXTRACTION Left    2019    Current Medications: Current Meds  Medication Sig  . albuterol (PROVENTIL) (2.5 MG/3ML) 0.083% nebulizer solution Take 2.5 mg by nebulization every 6 (six) hours as needed for wheezing or shortness of breath.  Marland Kitchen albuterol (VENTOLIN HFA) 108 (90 Base) MCG/ACT inhaler Inhale 2 puffs into the lungs every 6 (six) hours as needed for wheezing or shortness of breath.  Marland Kitchen apixaban (ELIQUIS) 5 MG TABS tablet Take 1 tablet (5 mg total) by mouth 2 (two) times daily.  . B Complex Vitamins (VITAMIN B COMPLEX PO) Take 1 tablet by mouth daily.  . benzonatate (TESSALON) 200 MG capsule Take 1 capsule (200 mg total) by mouth 3 (three) times daily as needed for cough.  . betamethasone dipropionate (DIPROLENE) 0.05 % ointment Apply 1 application topically 2 (two) times daily.   . budesonide-formoterol (SYMBICORT) 160-4.5 MCG/ACT inhaler Inhale 2 puffs into the lungs 2 (two) times daily.  . famotidine (PEPCID) 20 MG tablet Take 20 mg by mouth 2 (two) times daily.  Marland Kitchen guaiFENesin (MUCINEX) 600 MG 12 hr tablet Take 2 tablets (1,200 mg total) by mouth 2 (two) times daily as needed.  . ISOtretinoin (ACCUTANE) 40 MG capsule Take 40 mg by mouth 2 (two) times daily.   Marland Kitchen levothyroxine (SYNTHROID, LEVOTHROID) 100 MCG tablet Take 100 mcg by mouth daily before breakfast.   .  loratadine (CLARITIN) 10 MG tablet Take 10 mg by mouth daily.   . metoprolol succinate (TOPROL-XL) 50 MG 24 hr tablet Take 1 tablet (50 mg total) by mouth daily. Take with or immediately following a meal.  . mometasone-formoterol (DULERA) 100-5 MCG/ACT AERO Inhale 2 puffs into the lungs 2 (two) times daily as needed for wheezing.   . montelukast (SINGULAIR) 10 MG tablet TAKE 1 TABLET BY MOUTH EVERYDAY AT BEDTIME (Patient taking differently: Take 10 mg by mouth at bedtime. )  . mycophenolate  (CELLCEPT) 500 MG tablet Take 1,000 mg by mouth 2 (two) times daily.   . predniSONE (DELTASONE) 10 MG tablet Take 4 tabs po daily x 3 days; then 3 tabs daily x3 days; then 2 tabs daily x3 days; then 1 tab daily x 3 days; then stop  . [DISCONTINUED] diltiazem (CARDIZEM CD) 300 MG 24 hr capsule Take 1 capsule (300 mg total) by mouth daily.  . [DISCONTINUED] meclizine (ANTIVERT) 25 MG tablet Take 25 mg by mouth 2 (two) times daily.   . [DISCONTINUED] Multiple Minerals-Vitamins (CALCIUM-MAGNESIUM-ZINC-D3) TABS Take 1 tablet by mouth daily.      Allergies:   Gold and Mixed grasses   Social History   Socioeconomic History  . Marital status: Married    Spouse name: Not on file  . Number of children: Not on file  . Years of education: Not on file  . Highest education level: Not on file  Occupational History  . Not on file  Tobacco Use  . Smoking status: Former Smoker    Types: Cigarettes    Quit date: 1982    Years since quitting: 39.8  . Smokeless tobacco: Never Used  Vaping Use  . Vaping Use: Never assessed  Substance and Sexual Activity  . Alcohol use: Yes    Alcohol/week: 1.0 standard drink    Types: 1 Glasses of wine per week  . Drug use: No  . Sexual activity: Not on file  Other Topics Concern  . Not on file  Social History Narrative  . Not on file   Social Determinants of Health   Financial Resource Strain:   . Difficulty of Paying Living Expenses: Not on file  Food Insecurity:   . Worried About Charity fundraiser in the Last Year: Not on file  . Ran Out of Food in the Last Year: Not on file  Transportation Needs:   . Lack of Transportation (Medical): Not on file  . Lack of Transportation (Non-Medical): Not on file  Physical Activity:   . Days of Exercise per Week: Not on file  . Minutes of Exercise per Session: Not on file  Stress:   . Feeling of Stress : Not on file  Social Connections:   . Frequency of Communication with Friends and Family: Not on file  .  Frequency of Social Gatherings with Friends and Family: Not on file  . Attends Religious Services: Not on file  . Active Member of Clubs or Organizations: Not on file  . Attends Archivist Meetings: Not on file  . Marital Status: Not on file     Family History: The patient's family history includes Diabetes in his mother; Stroke in his mother.  ROS:   Please see the history of present illness.    Review of Systems  Constitutional: Positive for malaise/fatigue. Negative for chills, fever and weight loss.  HENT: Positive for congestion and sore throat. Negative for hearing loss.   Eyes: Negative for blurred vision.  Respiratory: Positive for cough, sputum production, shortness of breath and wheezing. Negative for hemoptysis.   Cardiovascular: Positive for leg swelling. Negative for chest pain, palpitations, orthopnea, claudication and PND.  Gastrointestinal: Positive for heartburn. Negative for blood in stool, nausea and vomiting.  Genitourinary: Negative for hematuria.  Musculoskeletal: Positive for myalgias.  Neurological: Positive for dizziness. Negative for loss of consciousness.  Endo/Heme/Allergies: Negative for polydipsia.  Psychiatric/Behavioral: Negative for substance abuse. The patient has insomnia.     EKGs/Labs/Other Studies Reviewed:    The following studies were reviewed today: TTE: 1. Left ventricular ejection fraction, by estimation, is 55 to 60%. The  left ventricle has normal function. The left ventricle has no regional  wall motion abnormalities. There is mild concentric left ventricular  hypertrophy. Left ventricular diastolic  function could not be evaluated.  2. Right ventricular systolic function is normal. The right ventricular  size is normal. Tricuspid regurgitation signal is inadequate for assessing  PA pressure.  3. The mitral valve is grossly normal. Mild to moderate mitral valve  regurgitation. No evidence of mitral stenosis.  4. The  aortic valve is tricuspid. There is moderate calcification of the  aortic valve. There is moderate thickening of the aortic valve. Aortic  valve regurgitation is not visualized. Mild to moderate aortic valve  sclerosis/calcification is present,  without any evidence of aortic stenosis.  5. Aortic dilatation noted. There is mild dilatation of the aortic root,  measuring 42 mm.  6. The inferior vena cava is dilated in size with >50% respiratory  variability, suggesting right atrial pressure of 8 mmHg.   EKG:  EKG is  ordered today.  The ekg ordered today demonstrates Afib with HR 92.  Recent Labs: 01/22/2020: ALT 18; B Natriuretic Peptide 184.6; Magnesium 2.1; TSH 1.171 02/20/2020: BUN 11; Creatinine, Ser 1.10; Hemoglobin 12.6; Platelets 279; Potassium 3.7; Sodium 141  Recent Lipid Panel No results found for: CHOL, TRIG, HDL, CHOLHDL, VLDL, LDLCALC, LDLDIRECT   Risk Assessment/Calculations:    This indicates a 2.2% annual risk of stroke. The patient's score is based upon: CHF History: 0 HTN History: 0 Diabetes History: 0 Stroke History: 0 Vascular Disease History: 1 Age Score: 1 Gender Score: 0    Physical Exam:    VS:  BP 100/70   Pulse 92   Ht 6' (1.829 m)   Wt 298 lb 9.6 oz (135.4 kg)   SpO2 95%   BMI 40.50 kg/m     Wt Readings from Last 3 Encounters:  03/11/20 298 lb 9.6 oz (135.4 kg)  03/02/20 296 lb 12.8 oz (134.6 kg)  02/23/20 289 lb (131.1 kg)     GEN:  Comfortable, NAD HEENT: Normal NECK: No JVD; No carotid bruits CARDIAC: Irregularly irregular, no murmurs, rubs, gallops RESPIRATORY:  Diffuse expiratory wheezing throughout. Normal work of breathing  ABDOMEN: Soft, non-tender, non-distended MUSCULOSKELETAL:  Warm, 1+ pitting edema to mid-shin SKIN: Warm and dry NEUROLOGIC:  Alert and oriented x 3 PSYCHIATRIC:  Normal affect   ASSESSMENT:    1. Atrial fibrillation with RVR (HCC)    PLAN:    In order of problems listed above:  #Atrial  fibrillation: CHADs-vasc 2. On anticoagulation with apixaban 5mg  BID. Failed DCCV on 10/18 (initial return to NSR but rapidly flipped back into Afib). On rate control with dilt and metop. Patient is more fatigued but denies any palpitations, lightheadedness or chest pain. After long discussion, would like to try and restore sinus rhythm as may contributing to underlying fatigue. Normal BiV function  and atrial size on TTE. -Continue eliquis 5mg  BID -Start amiodarone 400mg  BID as part of amio load (plan for 2 weeks, then switch to 200mg  BID pending plans from Afib clinic) -Will continue metop 50mg  XL; can increase to 100mg  PO if needed pending HR -Stop dilt as do not want to cause bradycardia in the setting of starting amiodarone -Follow-up with Afib clinic in 1 week -Once loaded on amiodarone, hopefully re-attempt DCCV -Ultimately goal is to only have him on short course of amiodarone, DCCV into rhythm, and maintain on BB -If fails above measures, will refer to EP for discussion of possible Afib ablation  #Mild-to-Moderate MR: Noted on TTE on 01/2020. 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  -Yearly surveillance imaging -Blood pressure control with metoprolol  -Will need to trend blood pressures off diltiazem; well controlled today -Advised against heavy lifting   #Lichen Planus: On chronic methotrexate. Off steroids now. -Follow-up with Derm as scheduled  #Chronic cough: -Continue follow-up with pulm and ENT as scheduled   Medication Adjustments/Labs and Tests Ordered: Current medicines are reviewed at length with the patient today.  Concerns regarding medicines are outlined above.  Orders Placed This Encounter  Procedures  . Basic metabolic panel  . Amb Referral to AFIB Clinic  . EKG 12-Lead   Meds ordered this encounter  Medications  . amiodarone (PACERONE) 400 MG tablet    Sig: Take 1 tablet (400 mg total) by  mouth 2 (two) times daily.    Dispense:  60 tablet    Refill:  11  . furosemide (LASIX) 20 MG tablet    Sig: Take one tablet by mouth daily for 3 days and then take daily as needed for shortness of breath or swelling.    Dispense:  90 tablet    Refill:  3    Patient Instructions  Medication Instructions:  Your physician has recommended you make the following change in your medication:  1-STOP Diltazem 2-START Amiodarone 400 mg by mouth twice daily 3-START Lasix 20 mg by mouth daily for 3 days, then take as needed   *If you need a refill on your cardiac medications before your next appointment, please call your pharmacy*  Lab Work: Your physician recommends that you return for lab work in: 1 week for BMET  If you have labs (blood work) drawn today and your tests are completely normal, you will receive your results only by: Marland Kitchen MyChart Message (if you have MyChart) OR . A paper copy in the mail If you have any lab test that is abnormal or we need to change your treatment, we will call you to review the results.  Testing/Procedures: None ordered today.  Follow-Up: At [facility], you and your health needs are our priority.  As part of our continuing mission to provide you with exceptional heart care, we have created designated Provider Care Teams.  These Care Teams include your primary Cardiologist (physician) and Advanced Practice Providers (APPs -  Physician Assistants and Nurse Practitioners) who all work together to provide you with the care you need, when you need it.  We recommend signing up for the patient portal called "MyChart".  Sign up information is provided on this After Visit Summary.  MyChart is used to connect with patients for Virtual Visits (Telemedicine).  Patients are able to view lab/test results, encounter notes, upcoming appointments, etc.  Non-urgent messages can be sent to your provider as well.   To  learn more about what you can do with MyChart, go to  [email or web address].    Your next appointment:   1 month(s)  The format for your next appointment:   In Person  Provider:   You may see Freada Bergeron, MD or one of the following Advanced Practice Providers on your designated Care Team:    Richardson Dopp, PA-C  Vin [place], PA-C  You have been referred to Atrial Fib Clinic in one week.       Signed, Freada Bergeron, MD  03/11/2020 12:15 PM    [place]

## 2020-03-11 ENCOUNTER — Encounter: Payer: Self-pay | Admitting: Cardiology

## 2020-03-11 ENCOUNTER — Other Ambulatory Visit: Payer: Self-pay

## 2020-03-11 ENCOUNTER — Ambulatory Visit: Payer: Medicare HMO | Admitting: Cardiology

## 2020-03-11 VITALS — BP 100/70 | HR 92 | Ht 72.0 in | Wt 298.6 lb

## 2020-03-11 DIAGNOSIS — I4891 Unspecified atrial fibrillation: Secondary | ICD-10-CM | POA: Diagnosis not present

## 2020-03-11 MED ORDER — AMIODARONE HCL 400 MG PO TABS: 400.0000 mg | ORAL_TABLET | Freq: Two times a day (BID) | ORAL | 11 refills | Status: DC

## 2020-03-11 MED ORDER — FUROSEMIDE 20 MG PO TABS: ORAL_TABLET | ORAL | 3 refills | Status: DC

## 2020-03-11 NOTE — Patient Instructions (Addendum)
Medication Instructions:  Your physician has recommended you make the following change in your medication:  1-STOP Diltazem 2-START Amiodarone 400 mg by mouth twice daily 3-START Lasix 20 mg by mouth daily for 3 days, then take as needed   *If you need a refill on your cardiac medications before your next appointment, please call your pharmacy*  Lab Work: Your physician recommends that you return for lab work in: 1 week for BMET  If you have labs (blood work) drawn today and your tests are completely normal, you will receive your results only by: Marland Kitchen MyChart Message (if you have MyChart) OR . A paper copy in the mail If you have any lab test that is abnormal or we need to change your treatment, we will call you to review the results.  Testing/Procedures: None ordered today.  Follow-Up: At [facility], you and your health needs are our priority.  As part of our continuing mission to provide you with exceptional heart care, we have created designated Provider Care Teams.  These Care Teams include your primary Cardiologist (physician) and Advanced Practice Providers (APPs -  Physician Assistants and Nurse Practitioners) who all work together to provide you with the care you need, when you need it.  We recommend signing up for the patient portal called "MyChart".  Sign up information is provided on this After Visit Summary.  MyChart is used to connect with patients for Virtual Visits (Telemedicine).  Patients are able to view lab/test results, encounter notes, upcoming appointments, etc.  Non-urgent messages can be sent to your provider as well.   To learn more about what you can do with MyChart, go to [email or web address].    Your next appointment:   1 month(s)  The format for your next appointment:   In Person  Provider:   You may see Freada Bergeron, MD or one of the following Advanced Practice Providers on your designated Care Team:    Richardson Dopp, PA-C  Vin Omao,  PA-C  You have been referred to Atrial Fib Clinic in one week.

## 2020-03-18 ENCOUNTER — Ambulatory Visit (HOSPITAL_COMMUNITY)
Admission: RE | Admit: 2020-03-18 | Discharge: 2020-03-18 | Disposition: A | Discharge status: Home / Self Care | Payer: Medicare HMO | Source: Ambulatory Visit | Attending: Nurse Practitioner | Admitting: Nurse Practitioner

## 2020-03-18 ENCOUNTER — Other Ambulatory Visit: Payer: Self-pay

## 2020-03-18 VITALS — BP 116/74 | HR 77 | Ht 72.0 in | Wt 293.0 lb

## 2020-03-18 DIAGNOSIS — I4819 Other persistent atrial fibrillation: Secondary | ICD-10-CM | POA: Diagnosis not present

## 2020-03-18 DIAGNOSIS — Z87891 Personal history of nicotine dependence: Secondary | ICD-10-CM | POA: Insufficient documentation

## 2020-03-18 DIAGNOSIS — D6869 Other thrombophilia: Secondary | ICD-10-CM | POA: Diagnosis not present

## 2020-03-18 DIAGNOSIS — I4891 Unspecified atrial fibrillation: Secondary | ICD-10-CM | POA: Diagnosis not present

## 2020-03-18 DIAGNOSIS — Z7901 Long term (current) use of anticoagulants: Secondary | ICD-10-CM | POA: Insufficient documentation

## 2020-03-18 LAB — COMPREHENSIVE METABOLIC PANEL
ALT: 19 U/L (ref 0–44)
AST: 14 U/L — ABNORMAL LOW (ref 15–41)
Albumin: 3.3 g/dL — ABNORMAL LOW (ref 3.5–5.0)
Alkaline Phosphatase: 44 U/L (ref 38–126)
Anion gap: 8 (ref 5–15)
BUN: 14 mg/dL (ref 8–23)
CO2: 26 mmol/L (ref 22–32)
Calcium: 9.1 mg/dL (ref 8.9–10.3)
Chloride: 104 mmol/L (ref 98–111)
Creatinine, Ser: 1.2 mg/dL (ref 0.61–1.24)
GFR, Estimated: >60 mL/min (ref >60)
Glucose, Bld: 100 mg/dL — ABNORMAL HIGH (ref 70–99)
Potassium: 4.4 mmol/L (ref 3.5–5.1)
Sodium: 138 mmol/L (ref 135–145)
Total Bilirubin: 1.6 mg/dL — ABNORMAL HIGH (ref 0.3–1.2)
Total Protein: 6 g/dL — ABNORMAL LOW (ref 6.5–8.1)

## 2020-03-18 NOTE — Patient Instructions (Signed)
On November 18th decrease amiodarone to 200mg  twice a day

## 2020-03-18 NOTE — Progress Notes (Signed)
Primary Care Physician: Lorene Dy, MD Referring Physician: Dr. Jon Gills is a 74 y.o. male with a h/o Lichen planus,  on cellcept,  thyroid disease, with a new dx of afib   in September of this year,  recently failing cardioversion.  Dr. Johney Frame saw pt back after unsuccessful cardioversion and started loading him on amiodarone 400 mg bid x 2 weeks then 200 mg a day and then retry cardioversion.  IN the office today, relates that he recently has had pneumonia and just finished a course of steroids. He remains with upper airway hoarseness and cough. This has been going on for a while and he has been evaluated by ENT and Pulmonology.It is felt that symptoms  are from Cellcept and eventually want to get pt off this and on to Accutane for his lichen planus that primarily affects his feet. The way that the pt described it, cellcept is aggravating his reflux which has caused the epiglottis to swell and cause intermittent aspiration and his last pneumonia was attributed to this. He states that he is tired I afib. He appears to be tolerating the amiodarone loading  dose without issues.  Today, he denies symptoms of palpitations, chest pain, shortness of breath, orthopnea, PND, lower extremity edema, dizziness, presyncope, syncope, or neurologic sequela. The patient is tolerating medications without difficulties and is otherwise without complaint today.   Past Medical History:  Diagnosis Date  . Lichen planus   . Methotrexate, long term, current use   . Psoriasis   . Thyroid disease    Past Surgical History:  Procedure Laterality Date  . CARDIOVERSION N/A 02/23/2020   Procedure: CARDIOVERSION;  Surgeon: Werner Lean, MD;  Location: [facility];  Service: Cardiovascular;  Laterality: N/A;  . CATARACT EXTRACTION Left    2019    Current Outpatient Medications  Medication Sig Dispense Refill  . albuterol (PROVENTIL) (2.5 MG/3ML) 0.083% nebulizer solution Take  2.5 mg by nebulization every 6 (six) hours as needed for wheezing or shortness of breath.    Marland Kitchen albuterol (VENTOLIN HFA) 108 (90 Base) MCG/ACT inhaler Inhale 2 puffs into the lungs every 6 (six) hours as needed for wheezing or shortness of breath. 18 g 3  . amiodarone (PACERONE) 400 MG tablet Take 1 tablet (400 mg total) by mouth 2 (two) times daily. 60 tablet 11  . apixaban (ELIQUIS) 5 MG TABS tablet Take 1 tablet (5 mg total) by mouth 2 (two) times daily. 180 tablet 3  . augmented betamethasone dipropionate (DIPROLENE-AF) 0.05 % ointment Apply topically.    . B Complex Vitamins (VITAMIN B COMPLEX PO) Take 1 tablet by mouth daily.    . benzonatate (TESSALON) 200 MG capsule Take 1 capsule (200 mg total) by mouth 3 (three) times daily as needed for cough. (Patient taking differently: Take 200 mg by mouth as needed for cough. ) 60 capsule 1  . betamethasone dipropionate (DIPROLENE) 0.05 % ointment Apply 1 application topically 2 (two) times daily.     . budesonide-formoterol (SYMBICORT) 160-4.5 MCG/ACT inhaler Inhale 2 puffs into the lungs 2 (two) times daily. 1 each 12  . famotidine (PEPCID) 20 MG tablet Take 20 mg by mouth 2 (two) times daily.    Marland Kitchen guaiFENesin (MUCINEX) 600 MG 12 hr tablet Take 2 tablets (1,200 mg total) by mouth 2 (two) times daily as needed. (Patient taking differently: Take 600 mg by mouth daily. ) 20 tablet 0  . ISOtretinoin (ACCUTANE) 40 MG capsule Take 40  mg by mouth 2 (two) times daily.     Marland Kitchen levothyroxine (SYNTHROID, LEVOTHROID) 100 MCG tablet Take 100 mcg by mouth daily before breakfast.     . loratadine (CLARITIN) 10 MG tablet Take 10 mg by mouth daily.     . metoprolol succinate (TOPROL-XL) 50 MG 24 hr tablet Take 1 tablet (50 mg total) by mouth daily. Take with or immediately following a meal. 90 tablet 3  . montelukast (SINGULAIR) 10 MG tablet TAKE 1 TABLET BY MOUTH EVERYDAY AT BEDTIME (Patient taking differently: Take 10 mg by mouth at bedtime. ) 90 tablet 1  .  mycophenolate (CELLCEPT) 500 MG tablet Take 1,000 mg by mouth 2 (two) times daily.     . furosemide (LASIX) 20 MG tablet Take one tablet by mouth daily for 3 days and then take daily as needed for shortness of breath or swelling. (Patient not taking: Reported on 03/18/2020) 90 tablet 3  . mometasone-formoterol (DULERA) 100-5 MCG/ACT AERO Inhale 2 puffs into the lungs 2 (two) times daily as needed for wheezing.  (Patient not taking: Reported on 03/18/2020)     No current facility-administered medications for this encounter.    Allergies  Allergen Reactions  . Gold Itching  . Mixed Grasses     unknown    Social History   Socioeconomic History  . Marital status: Married    Spouse name: Not on file  . Number of children: Not on file  . Years of education: Not on file  . Highest education level: Not on file  Occupational History  . Not on file  Tobacco Use  . Smoking status: Former Smoker    Types: Cigarettes    Quit date: 1982    Years since quitting: 39.8  . Smokeless tobacco: Never Used  Vaping Use  . Vaping Use: Never assessed  Substance and Sexual Activity  . Alcohol use: Yes    Alcohol/week: 1.0 standard drink    Types: 1 Glasses of wine per week  . Drug use: No  . Sexual activity: Not on file  Other Topics Concern  . Not on file  Social History Narrative  . Not on file   Social Determinants of Health   Financial Resource Strain:   . Difficulty of Paying Living Expenses: Not on file  Food Insecurity:   . Worried About Charity fundraiser in the Last Year: Not on file  . Ran Out of Food in the Last Year: Not on file  Transportation Needs:   . Lack of Transportation (Medical): Not on file  . Lack of Transportation (Non-Medical): Not on file  Physical Activity:   . Days of Exercise per Week: Not on file  . Minutes of Exercise per Session: Not on file  Stress:   . Feeling of Stress : Not on file  Social Connections:   . Frequency of Communication with Friends  and Family: Not on file  . Frequency of Social Gatherings with Friends and Family: Not on file  . Attends Religious Services: Not on file  . Active Member of Clubs or Organizations: Not on file  . Attends Archivist Meetings: Not on file  . Marital Status: Not on file  Intimate Partner Violence:   . Fear of Current or Ex-Partner: Not on file  . Emotionally Abused: Not on file  . Physically Abused: Not on file  . Sexually Abused: Not on file    Family History  Problem Relation Age of Onset  . Diabetes  Mother   . Stroke Mother     ROS- All systems are reviewed and negative except as per the HPI above  Physical Exam: Vitals:   03/18/20 1039  BP: 116/74  Pulse: 77  Weight: 132.9 kg  Height: 6' (1.829 m)   Wt Readings from Last 3 Encounters:  03/18/20 132.9 kg  03/11/20 135.4 kg  03/02/20 134.6 kg    Labs: Lab Results  Component Value Date   NA 141 02/20/2020   K 3.7 02/20/2020   CL 106 02/20/2020   CO2 21 02/20/2020   GLUCOSE 98 02/20/2020   BUN 11 02/20/2020   CREATININE 1.10 02/20/2020   CALCIUM 9.1 02/20/2020   MG 2.1 01/22/2020   No results found for: INR No results found for: CHOL, HDL, LDLCALC, TRIG   GEN- The patient is well appearing, alert and oriented x 3 today.   Head- normocephalic, atraumatic Eyes-  Sclera clear, conjunctiva pink Ears- hearing intact Oropharynx- clear Neck- supple, no JVP Lymph- no cervical lymphadenopathy Lungs- Clear to ausculation bilaterally, normal work of breathing Heart- irregular rate and rhythm, no murmurs, rubs or gallops, PMI not laterally displaced GI- soft, NT, ND, + BS Extremities- no clubbing, cyanosis, or edema MS- no significant deformity or atrophy Skin- no rash or lesion Psych- euthymic mood, full affect Neuro- strength and sensation are intact  EKG-afib at 77 bpm, qrs int 98 ms, qtc 470 ms    Assessment and Plan: 1. Persistent  afib Failed cardioversion Is now loading on amiodarone 400  mg bid  He wil reduce to 200 mg bid 11/18 I will see back in 10 days to set up for cardioversion  Potential for amio to be a bridge to ablation  Will discuss later with pt  cmet today   2. CHA2DS2VASc  Score of  1(age) Continue eliquis 5 mg bid  Stressed not to miss doses while planning on cardioversion    Butch Penny C. Deng Kemler, [facility] [address] [place], [place] [address] [phone]

## 2020-03-19 ENCOUNTER — Other Ambulatory Visit: Payer: Medicare HMO

## 2020-03-19 ENCOUNTER — Ambulatory Visit: Payer: Medicare HMO

## 2020-03-19 ENCOUNTER — Other Ambulatory Visit: Payer: Self-pay

## 2020-03-19 DIAGNOSIS — R400 Somnolence: Secondary | ICD-10-CM

## 2020-03-19 DIAGNOSIS — G4733 Obstructive sleep apnea (adult) (pediatric): Secondary | ICD-10-CM | POA: Diagnosis not present

## 2020-03-23 NOTE — Assessment & Plan Note (Addendum)
Dx with Pneumonia in late August 2021 treated with Levaquin. Symptoms were slow to resolve but reported feeling back to baseline on 02/23/20. He then developed increased cough and wheezing several days ago. He is compliant with Symbicort, Mucinex, Singulair and Zyrtec. CXR today showed no acute cardiopulmonary process. Plan sending in prednisone taper and checking sputum samples. Patient to follow-up if symptoms do not improve or worsen.  Due for repeat CT chest in January 2022 to follow-up on LLL opacities.

## 2020-03-25 DIAGNOSIS — G4733 Obstructive sleep apnea (adult) (pediatric): Secondary | ICD-10-CM | POA: Diagnosis not present

## 2020-03-26 ENCOUNTER — Telehealth: Payer: Self-pay | Admitting: Pulmonary Disease

## 2020-03-26 DIAGNOSIS — G4733 Obstructive sleep apnea (adult) (pediatric): Secondary | ICD-10-CM

## 2020-03-26 NOTE — Telephone Encounter (Signed)
Called and spoke with patient to let him know about recent sleep study and Dr. Lollie Sails recs. Patient expressed understanding and would like to proceed with CPAP set up. Order has been placed. Advised patient to call and schedule an appointment once he receives his machine to do a follow up and compliance report. He expressed understanding. Nothing further needed at this time.

## 2020-03-26 NOTE — Telephone Encounter (Signed)
Call patient  Sleep study result  Date of study: 03/20/2020  Impression: Severe obstructive sleep apnea Moderately severe oxygen desaturations  Recommendation: DME referral  Recommend CPAP therapy for severe obstructive sleep apnea  Auto titrating CPAP with pressure settings of 5-20 will be appropriate  Encourage weight loss measures  Follow-up in the office 4 to 6 weeks following initiation of treatment

## 2020-03-29 ENCOUNTER — Other Ambulatory Visit: Payer: Self-pay

## 2020-03-29 ENCOUNTER — Ambulatory Visit (HOSPITAL_COMMUNITY)
Admission: RE | Admit: 2020-03-29 | Discharge: 2020-03-29 | Disposition: A | Discharge status: Home / Self Care | Payer: Medicare HMO | Source: Ambulatory Visit | Attending: Nurse Practitioner | Admitting: Nurse Practitioner

## 2020-03-29 ENCOUNTER — Ambulatory Visit: Payer: Medicare HMO | Admitting: Cardiology

## 2020-03-29 VITALS — BP 100/66 | HR 79 | Ht 72.0 in | Wt 297.8 lb

## 2020-03-29 DIAGNOSIS — D6869 Other thrombophilia: Secondary | ICD-10-CM

## 2020-03-29 DIAGNOSIS — I4819 Other persistent atrial fibrillation: Secondary | ICD-10-CM | POA: Diagnosis present

## 2020-03-29 DIAGNOSIS — Z87891 Personal history of nicotine dependence: Secondary | ICD-10-CM | POA: Diagnosis not present

## 2020-03-29 LAB — CBC
HCT: 38.8 % — ABNORMAL LOW (ref 39.0–52.0)
Hemoglobin: 12.4 g/dL — ABNORMAL LOW (ref 13.0–17.0)
MCH: 29.5 pg (ref 26.0–34.0)
MCHC: 32 g/dL (ref 30.0–36.0)
MCV: 92.4 fL (ref 80.0–100.0)
Platelets: 229 10*3/uL (ref 150–400)
RBC: 4.2 MIL/uL — ABNORMAL LOW (ref 4.22–5.81)
RDW: 15.8 % — ABNORMAL HIGH (ref 11.5–15.5)
WBC: 6.4 10*3/uL (ref 4.0–10.5)
nRBC: 0 % (ref 0.0–0.2)

## 2020-03-29 LAB — BASIC METABOLIC PANEL
Anion gap: 11 (ref 5–15)
BUN: 15 mg/dL (ref 8–23)
CO2: 24 mmol/L (ref 22–32)
Calcium: 8.9 mg/dL (ref 8.9–10.3)
Chloride: 105 mmol/L (ref 98–111)
Creatinine, Ser: 1.15 mg/dL (ref 0.61–1.24)
GFR, Estimated: >60 mL/min (ref >60)
Glucose, Bld: 116 mg/dL — ABNORMAL HIGH (ref 70–99)
Potassium: 3.8 mmol/L (ref 3.5–5.1)
Sodium: 140 mmol/L (ref 135–145)

## 2020-03-29 MED ORDER — AMIODARONE HCL 400 MG PO TABS
200.0000 mg | ORAL_TABLET | Freq: Two times a day (BID) | ORAL | Status: DC
Start: 2020-03-29 — End: 2020-04-28

## 2020-03-29 NOTE — H&P (View-Only) (Signed)
Primary Care Physician: Lorene Dy, MD Referring Physician: Dr. Jon Gills is a 74 y.o. male with a h/o Lichen planus,  on cellcept,  thyroid disease, with a new dx of afib   in September of this year,  recently failing cardioversion.  Dr. Johney Frame saw pt back after unsuccessful cardioversion and started loading him on amiodarone 400 mg bid x 2 weeks then 200 mg a day and then retry cardioversion.  IN the office today, relates that he recently has had pneumonia and just finished a course of steroids. He remains with upper airway hoarseness and cough. This has been going on for a while and he has been evaluated by ENT and Pulmonology.It is felt that symptoms  are from Cellcept and eventually want to get pt off this and on to Accutane for his lichen planus that primarily affects his feet. The way that the pt described it, cellcept is aggravating his reflux which has caused the epiglottis to swell and cause intermittent aspiration and his last pneumonia was attributed to this. He states that he is tired in  afib. He appears to be tolerating the amiodarone loading  dose without issues.  F/u in afib clinic, 03/29/20. EKG at 79 bpm.  He is continuing amiodarone load, now on decreased dose of  200 mg bid. He will have been loading x one month 12/5. No missed anticoagulation.   Today, he denies symptoms of palpitations, chest pain, shortness of breath, orthopnea, PND, lower extremity edema, dizziness, presyncope, syncope, or neurologic sequela. The patient is tolerating medications without difficulties and is otherwise without complaint today.   Past Medical History:  Diagnosis Date  . Lichen planus   . Methotrexate, long term, current use   . Psoriasis   . Thyroid disease    Past Surgical History:  Procedure Laterality Date  . CARDIOVERSION N/A 02/23/2020   Procedure: CARDIOVERSION;  Surgeon: Werner Lean, MD;  Location: [facility];  Service: Cardiovascular;   Laterality: N/A;  . CATARACT EXTRACTION Left    2019    Current Outpatient Medications  Medication Sig Dispense Refill  . albuterol (PROVENTIL) (2.5 MG/3ML) 0.083% nebulizer solution Take 2.5 mg by nebulization every 6 (six) hours as needed for wheezing or shortness of breath.    Marland Kitchen albuterol (VENTOLIN HFA) 108 (90 Base) MCG/ACT inhaler Inhale 2 puffs into the lungs every 6 (six) hours as needed for wheezing or shortness of breath. 18 g 3  . amiodarone (PACERONE) 400 MG tablet Take 0.5 tablets (200 mg total) by mouth 2 (two) times daily.    Marland Kitchen apixaban (ELIQUIS) 5 MG TABS tablet Take 1 tablet (5 mg total) by mouth 2 (two) times daily. 180 tablet 3  . augmented betamethasone dipropionate (DIPROLENE-AF) 0.05 % ointment Apply topically.    . B Complex Vitamins (VITAMIN B COMPLEX PO) Take 1 tablet by mouth daily.    . benzonatate (TESSALON) 200 MG capsule Take 1 capsule (200 mg total) by mouth 3 (three) times daily as needed for cough. (Patient taking differently: Take 200 mg by mouth as needed for cough. ) 60 capsule 1  . betamethasone dipropionate (DIPROLENE) 0.05 % ointment Apply 1 application topically 2 (two) times daily.     . budesonide-formoterol (SYMBICORT) 160-4.5 MCG/ACT inhaler Inhale 2 puffs into the lungs 2 (two) times daily. 1 each 12  . famotidine (PEPCID) 20 MG tablet Take 20 mg by mouth 2 (two) times daily.    . ISOtretinoin (ACCUTANE) 40 MG capsule  Take 40 mg by mouth 2 (two) times daily.     Marland Kitchen levothyroxine (SYNTHROID, LEVOTHROID) 100 MCG tablet Take 100 mcg by mouth daily before breakfast.     . loratadine (CLARITIN) 10 MG tablet Take 10 mg by mouth daily.     . metoprolol succinate (TOPROL-XL) 50 MG 24 hr tablet Take 1 tablet (50 mg total) by mouth daily. Take with or immediately following a meal. 90 tablet 3  . montelukast (SINGULAIR) 10 MG tablet TAKE 1 TABLET BY MOUTH EVERYDAY AT BEDTIME (Patient taking differently: Take 10 mg by mouth at bedtime. ) 90 tablet 1  .  mycophenolate (CELLCEPT) 500 MG tablet Take 1,000 mg by mouth 2 (two) times daily.     . furosemide (LASIX) 20 MG tablet Take one tablet by mouth daily for 3 days and then take daily as needed for shortness of breath or swelling. (Patient not taking: Reported on 03/29/2020) 90 tablet 3  . guaiFENesin (MUCINEX) 600 MG 12 hr tablet Take 2 tablets (1,200 mg total) by mouth 2 (two) times daily as needed. (Patient not taking: Reported on 03/29/2020) 20 tablet 0  . mometasone-formoterol (DULERA) 100-5 MCG/ACT AERO Inhale 2 puffs into the lungs 2 (two) times daily as needed for wheezing.  (Patient not taking: Reported on 03/29/2020)     No current facility-administered medications for this encounter.    Allergies  Allergen Reactions  . Gold Itching  . Mixed Grasses     unknown    Social History   Socioeconomic History  . Marital status: Married    Spouse name: Not on file  . Number of children: Not on file  . Years of education: Not on file  . Highest education level: Not on file  Occupational History  . Not on file  Tobacco Use  . Smoking status: Former Smoker    Types: Cigarettes    Quit date: 1982    Years since quitting: 39.9  . Smokeless tobacco: Never Used  Vaping Use  . Vaping Use: Never assessed  Substance and Sexual Activity  . Alcohol use: Yes    Alcohol/week: 1.0 standard drink    Types: 1 Glasses of wine per week  . Drug use: No  . Sexual activity: Not on file  Other Topics Concern  . Not on file  Social History Narrative  . Not on file   Social Determinants of Health   Financial Resource Strain:   . Difficulty of Paying Living Expenses: Not on file  Food Insecurity:   . Worried About Charity fundraiser in the Last Year: Not on file  . Ran Out of Food in the Last Year: Not on file  Transportation Needs:   . Lack of Transportation (Medical): Not on file  . Lack of Transportation (Non-Medical): Not on file  Physical Activity:   . Days of Exercise per Week:  Not on file  . Minutes of Exercise per Session: Not on file  Stress:   . Feeling of Stress : Not on file  Social Connections:   . Frequency of Communication with Friends and Family: Not on file  . Frequency of Social Gatherings with Friends and Family: Not on file  . Attends Religious Services: Not on file  . Active Member of Clubs or Organizations: Not on file  . Attends Archivist Meetings: Not on file  . Marital Status: Not on file  Intimate Partner Violence:   . Fear of Current or Ex-Partner: Not on file  .  Emotionally Abused: Not on file  . Physically Abused: Not on file  . Sexually Abused: Not on file    Family History  Problem Relation Age of Onset  . Diabetes Mother   . Stroke Mother     ROS- All systems are reviewed and negative except as per the HPI above  Physical Exam: Vitals:   03/29/20 1108  BP: 100/66  Pulse: 79  Weight: 135.1 kg  Height: 6' (1.829 m)   Wt Readings from Last 3 Encounters:  03/29/20 135.1 kg  03/18/20 132.9 kg  03/11/20 135.4 kg    Labs: Lab Results  Component Value Date   NA 138 03/18/2020   K 4.4 03/18/2020   CL 104 03/18/2020   CO2 26 03/18/2020   GLUCOSE 100 (H) 03/18/2020   BUN 14 03/18/2020   CREATININE 1.20 03/18/2020   CALCIUM 9.1 03/18/2020   MG 2.1 01/22/2020   No results found for: INR No results found for: CHOL, HDL, LDLCALC, TRIG   GEN- The patient is well appearing, alert and oriented x 3 today.   Head- normocephalic, atraumatic Eyes-  Sclera clear, conjunctiva pink Ears- hearing intact Oropharynx- clear Neck- supple, no JVP Lymph- no cervical lymphadenopathy Lungs- Clear to ausculation bilaterally, normal work of breathing Heart- irregular rate and rhythm, no murmurs, rubs or gallops, PMI not laterally displaced GI- soft, NT, ND, + BS Extremities- no clubbing, cyanosis, or edema MS- no significant deformity or atrophy Skin- no rash or lesion Psych- euthymic mood, full affect Neuro- strength  and sensation are intact  EKG-afib at 79 bpm, qrs int 102 ms, qtc 451 ms   Assessment and Plan: 1. Persistent afib Failed cardioversion Has been loading on amiodarone since 11/4 Is now loading on amiodarone, now at 200 mg bid as of 11/18 He will be scheduled for cardioversion 12/6 Risk vrs benefit of cardioversion discussed  Potential for amio to be a bridge to ablation if can maintain SR  Bmet/cbc/covid test   2. CHA2DS2VASc  Score of  1(age) Continue eliquis 5 mg bid, states no missed doses   WIll move Dr. Jacolyn Reedy f/u from  12/7 to one week after cardioversion  He will stay on amiodarone 200 mg bid after CV and anticipate lowering of dose to 200 mg daily at that visit   Butch Penny C. Grey Schlauch, [facility] [address] [place], [place] [address] [phone]

## 2020-03-29 NOTE — Progress Notes (Signed)
Primary Care Physician: Lorene Dy, MD Referring Physician: Dr. Jon Gills is a 74 y.o. male with a h/o Lichen planus,  on cellcept,  thyroid disease, with a new dx of afib   in September of this year,  recently failing cardioversion.  Dr. Johney Frame saw pt back after unsuccessful cardioversion and started loading him on amiodarone 400 mg bid x 2 weeks then 200 mg a day and then retry cardioversion.  IN the office today, relates that he recently has had pneumonia and just finished a course of steroids. He remains with upper airway hoarseness and cough. This has been going on for a while and he has been evaluated by ENT and Pulmonology.It is felt that symptoms  are from Cellcept and eventually want to get pt off this and on to Accutane for his lichen planus that primarily affects his feet. The way that the pt described it, cellcept is aggravating his reflux which has caused the epiglottis to swell and cause intermittent aspiration and his last pneumonia was attributed to this. He states that he is tired in  afib. He appears to be tolerating the amiodarone loading  dose without issues.  F/u in afib clinic, 03/29/20. EKG at 79 bpm.  He is continuing amiodarone load, now on decreased dose of  200 mg bid. He will have been loading x one month 12/5. No missed anticoagulation.   Today, he denies symptoms of palpitations, chest pain, shortness of breath, orthopnea, PND, lower extremity edema, dizziness, presyncope, syncope, or neurologic sequela. The patient is tolerating medications without difficulties and is otherwise without complaint today.   Past Medical History:  Diagnosis Date  . Lichen planus   . Methotrexate, long term, current use   . Psoriasis   . Thyroid disease    Past Surgical History:  Procedure Laterality Date  . CARDIOVERSION N/A 02/23/2020   Procedure: CARDIOVERSION;  Surgeon: Werner Lean, MD;  Location: [facility];  Service: Cardiovascular;   Laterality: N/A;  . CATARACT EXTRACTION Left    2019    Current Outpatient Medications  Medication Sig Dispense Refill  . albuterol (PROVENTIL) (2.5 MG/3ML) 0.083% nebulizer solution Take 2.5 mg by nebulization every 6 (six) hours as needed for wheezing or shortness of breath.    Marland Kitchen albuterol (VENTOLIN HFA) 108 (90 Base) MCG/ACT inhaler Inhale 2 puffs into the lungs every 6 (six) hours as needed for wheezing or shortness of breath. 18 g 3  . amiodarone (PACERONE) 400 MG tablet Take 0.5 tablets (200 mg total) by mouth 2 (two) times daily.    Marland Kitchen apixaban (ELIQUIS) 5 MG TABS tablet Take 1 tablet (5 mg total) by mouth 2 (two) times daily. 180 tablet 3  . augmented betamethasone dipropionate (DIPROLENE-AF) 0.05 % ointment Apply topically.    . B Complex Vitamins (VITAMIN B COMPLEX PO) Take 1 tablet by mouth daily.    . benzonatate (TESSALON) 200 MG capsule Take 1 capsule (200 mg total) by mouth 3 (three) times daily as needed for cough. (Patient taking differently: Take 200 mg by mouth as needed for cough. ) 60 capsule 1  . betamethasone dipropionate (DIPROLENE) 0.05 % ointment Apply 1 application topically 2 (two) times daily.     . budesonide-formoterol (SYMBICORT) 160-4.5 MCG/ACT inhaler Inhale 2 puffs into the lungs 2 (two) times daily. 1 each 12  . famotidine (PEPCID) 20 MG tablet Take 20 mg by mouth 2 (two) times daily.    . ISOtretinoin (ACCUTANE) 40 MG capsule  Take 40 mg by mouth 2 (two) times daily.     Marland Kitchen levothyroxine (SYNTHROID, LEVOTHROID) 100 MCG tablet Take 100 mcg by mouth daily before breakfast.     . loratadine (CLARITIN) 10 MG tablet Take 10 mg by mouth daily.     . metoprolol succinate (TOPROL-XL) 50 MG 24 hr tablet Take 1 tablet (50 mg total) by mouth daily. Take with or immediately following a meal. 90 tablet 3  . montelukast (SINGULAIR) 10 MG tablet TAKE 1 TABLET BY MOUTH EVERYDAY AT BEDTIME (Patient taking differently: Take 10 mg by mouth at bedtime. ) 90 tablet 1  .  mycophenolate (CELLCEPT) 500 MG tablet Take 1,000 mg by mouth 2 (two) times daily.     . furosemide (LASIX) 20 MG tablet Take one tablet by mouth daily for 3 days and then take daily as needed for shortness of breath or swelling. (Patient not taking: Reported on 03/29/2020) 90 tablet 3  . guaiFENesin (MUCINEX) 600 MG 12 hr tablet Take 2 tablets (1,200 mg total) by mouth 2 (two) times daily as needed. (Patient not taking: Reported on 03/29/2020) 20 tablet 0  . mometasone-formoterol (DULERA) 100-5 MCG/ACT AERO Inhale 2 puffs into the lungs 2 (two) times daily as needed for wheezing.  (Patient not taking: Reported on 03/29/2020)     No current facility-administered medications for this encounter.    Allergies  Allergen Reactions  . Gold Itching  . Mixed Grasses     unknown    Social History   Socioeconomic History  . Marital status: Married    Spouse name: Not on file  . Number of children: Not on file  . Years of education: Not on file  . Highest education level: Not on file  Occupational History  . Not on file  Tobacco Use  . Smoking status: Former Smoker    Types: Cigarettes    Quit date: 1982    Years since quitting: 39.9  . Smokeless tobacco: Never Used  Vaping Use  . Vaping Use: Never assessed  Substance and Sexual Activity  . Alcohol use: Yes    Alcohol/week: 1.0 standard drink    Types: 1 Glasses of wine per week  . Drug use: No  . Sexual activity: Not on file  Other Topics Concern  . Not on file  Social History Narrative  . Not on file   Social Determinants of Health   Financial Resource Strain:   . Difficulty of Paying Living Expenses: Not on file  Food Insecurity:   . Worried About Charity fundraiser in the Last Year: Not on file  . Ran Out of Food in the Last Year: Not on file  Transportation Needs:   . Lack of Transportation (Medical): Not on file  . Lack of Transportation (Non-Medical): Not on file  Physical Activity:   . Days of Exercise per Week:  Not on file  . Minutes of Exercise per Session: Not on file  Stress:   . Feeling of Stress : Not on file  Social Connections:   . Frequency of Communication with Friends and Family: Not on file  . Frequency of Social Gatherings with Friends and Family: Not on file  . Attends Religious Services: Not on file  . Active Member of Clubs or Organizations: Not on file  . Attends Archivist Meetings: Not on file  . Marital Status: Not on file  Intimate Partner Violence:   . Fear of Current or Ex-Partner: Not on file  .  Emotionally Abused: Not on file  . Physically Abused: Not on file  . Sexually Abused: Not on file    Family History  Problem Relation Age of Onset  . Diabetes Mother   . Stroke Mother     ROS- All systems are reviewed and negative except as per the HPI above  Physical Exam: Vitals:   03/29/20 1108  BP: 100/66  Pulse: 79  Weight: 135.1 kg  Height: 6' (1.829 m)   Wt Readings from Last 3 Encounters:  03/29/20 135.1 kg  03/18/20 132.9 kg  03/11/20 135.4 kg    Labs: Lab Results  Component Value Date   NA 138 03/18/2020   K 4.4 03/18/2020   CL 104 03/18/2020   CO2 26 03/18/2020   GLUCOSE 100 (H) 03/18/2020   BUN 14 03/18/2020   CREATININE 1.20 03/18/2020   CALCIUM 9.1 03/18/2020   MG 2.1 01/22/2020   No results found for: INR No results found for: CHOL, HDL, LDLCALC, TRIG   GEN- The patient is well appearing, alert and oriented x 3 today.   Head- normocephalic, atraumatic Eyes-  Sclera clear, conjunctiva pink Ears- hearing intact Oropharynx- clear Neck- supple, no JVP Lymph- no cervical lymphadenopathy Lungs- Clear to ausculation bilaterally, normal work of breathing Heart- irregular rate and rhythm, no murmurs, rubs or gallops, PMI not laterally displaced GI- soft, NT, ND, + BS Extremities- no clubbing, cyanosis, or edema MS- no significant deformity or atrophy Skin- no rash or lesion Psych- euthymic mood, full affect Neuro- strength  and sensation are intact  EKG-afib at 79 bpm, qrs int 102 ms, qtc 451 ms   Assessment and Plan: 1. Persistent afib Failed cardioversion Has been loading on amiodarone since 11/4 Is now loading on amiodarone, now at 200 mg bid as of 11/18 He will be scheduled for cardioversion 12/6 Risk vrs benefit of cardioversion discussed  Potential for amio to be a bridge to ablation if can maintain SR  Bmet/cbc/covid test   2. CHA2DS2VASc  Score of  1(age) Continue eliquis 5 mg bid, states no missed doses   WIll move Dr. Jacolyn Reedy f/u from  12/7 to one week after cardioversion  He will stay on amiodarone 200 mg bid after CV and anticipate lowering of dose to 200 mg daily at that visit   Butch Penny C. Jeptha Hinnenkamp, [facility] [address] [place], [place] [address] [phone]

## 2020-03-29 NOTE — Patient Instructions (Signed)
Cardioversion scheduled for Monday, December 6th  - Arrive at the [facility] and go to admitting at 930AM  - Do not eat or drink anything after midnight the night prior to your procedure.  - Take all your morning medication (except diabetic medications) with a sip of water prior to arrival.  - You will not be able to drive home after your procedure.  - Do NOT miss any doses of your blood thinner - if you should miss a dose please notify our office immediately.  - If you feel as if you go back into normal rhythm prior to scheduled cardioversion, please notify our office immediately. If your procedure is canceled in the cardioversion suite you will be charged a cancellation fee.  Continue Amiodarone 200mg  twice a day until follow up with Dr. Johney Frame -- will have her office move the appt to 1-2 weeks after cardioversion instead of 12/7

## 2020-03-30 ENCOUNTER — Ambulatory Visit (INDEPENDENT_AMBULATORY_CARE_PROVIDER_SITE_OTHER): Payer: Medicare HMO | Admitting: Sports Medicine

## 2020-03-30 VITALS — BP 104/60 | Ht 72.0 in | Wt 296.0 lb

## 2020-03-30 DIAGNOSIS — M75121 Complete rotator cuff tear or rupture of right shoulder, not specified as traumatic: Secondary | ICD-10-CM | POA: Diagnosis not present

## 2020-03-30 MED ORDER — PREDNISONE 20 MG PO TABS: 20.0000 mg | ORAL_TABLET | Freq: Two times a day (BID) | ORAL | 0 refills | Status: DC

## 2020-03-30 NOTE — Assessment & Plan Note (Addendum)
Complete tear of both the subscap and the supraspinatus rotator cuff tendons.  Given his age and relatively good range of motion and function on exam today we will recommend some specific exercises to strengthen the supporting muscles of the rotator cuff.  Acute episode of pain and swelling and with his changes on [place] will use prednisone 20 bid x 7 days. Also recommended Voltaren gel and to follow-up with [place] in 3 weeks.  Hopefully he will be able to function well it is somewhat encouraging that his pain is getting better.  Although I did discuss with him the possibility that if his symptoms worsen he does not improve and his ability to do daily activities and basic functions we may refer him to Dr. Tamera Punt for consultation.

## 2020-03-30 NOTE — Patient Instructions (Addendum)
It was great to see you today! Thank you for letting me participate in your care!  Today, we discussed your right shoulder pain that is likely from a complete tear of two muscles of the rotator cuff. I am glad it is getting better. I would like you to do the exercises at home that we gave you at least 5 times per week, 3 sets each exercise. You can use Voltaren Gel 4 times per day as needed for pain.  Please take the Prednisone as directed.   If you are not improving in 4 weeks please return to our clinic. If you are not better we may refer you to ortho.  Be well, Harolyn Rutherford, DO PGY-4, Sports Medicine Fellow [place]

## 2020-03-30 NOTE — Progress Notes (Signed)
SUBJECTIVE:   CHIEF COMPLAINT / HPI:   Right shoulder pain Todd Harrison is a very pleasant 74 year old male who is an established patient at this practice has not been seen in quite some time who presents today for right shoulder pain.  Of note he has a pertinent past medical history of previous rotator cuff tear that was treated without surgery.  He states he was sitting in his recliner when evening when he went to go use his arm to push up and reach behind and he had immediate pain.  This occurred about 5 weeks ago and it continued to hurt him every day until today when he suddenly had improvement.  He states any kind of motion which required him to flex his shoulder or raise it to the side was painful and it was hurting him at night and sometimes waking him up.  It did have an intermediate pain pattern but worse with activity and better when resting at my side.  He states it felt similar to the previous injury he had in his shoulder before.  He has not noticed any swelling or bruising of the area.  PERTINENT  PMH / PSH: Atrial fibrillation, previous rotator cuff tear of the right shoulder Significant Lichen planus affecting feet.  This keeps him from playing tennis and causes burning and pain with standing. OBJECTIVE:   BP 104/60   Ht 6' (1.829 m)   Wt 296 lb (134.3 kg)   BMI 40.14 kg/m   [facility] 03/30/2020  Frequency of aerobic exercise (# of days/week) 0  Average time in minutes 0  Frequency of strengthening activities (# of days/week) 0   Shoulder, Right: No evidence of bony deformity, asymmetry, or muscle atrophy; Mild tenderness over long head of biceps (bicipital groove). No TTP at [facility] joint. Full active and passive range of motion (180 flex Todd Harrison /150Abd /90ER /70IR), Thumb to T12 without significant tenderness. Positive painful arc test. Strength testing significant for weakness with arm in flexion and abduction and when testing internal  rotation; otherwise normal. No abnormal scapular function observed. Sensation intact. Peripheral pulses intact. Special Tests:   - Crossarm test: NEG - Jobe test: Positive   - Hawkins: NEG   - Neer test: NEG   - Belly Press test: Neg   - Drop arm test: NEG  Limited MSK ultrasound: Right shoulder Complete Ultrasound of Left Shoulder Biceps tendon: well visualized in both transverse and long axis with no effusion surrounding the tendon and mild disruption of the tendon fibers but no evidence of tearing. Pec major tendon: well visualized and inserting at the greater head of the humerus Subscapularis: absent and not seen on ultrasound exam.  AC joint: well visualized, no giser sign present but osteophyte development present. Infraspinatus: well visualized with no disruption of the fibers, no hypoechoic changes Supraspinatus: absent and not found on ultrasound exam. Area of hypoechoic fluid accumulation seen in area where we typically find suprispinatus Posterior glenohumeral joint: well visualized Interpretation: Abnormal ultrasound of the right shoulder including complete tear of the subscap and supraspinatus tendons of the rotator cuff.  Ultrasound and interpretation by Dr. Garlan Harrison and Todd Harrison. Fields, MD    ASSESSMENT/PLAN:   Rotator cuff tear, right Complete tear of both the subscap and the supraspinatus rotator cuff tendons.  Given his age and relatively good range of motion and function on exam today we will recommend some specific exercises to strengthen the supporting muscles of the rotator cuff.  Also recommended Voltaren gel and to follow-up with [place] in 3 weeks.  Hopefully he will be able to function well it is somewhat encouraging that his pain is getting better.  Although I did discuss with him the possibility that if his symptoms worsen he does not improve and his ability to do daily activities and basic functions we may refer him to Dr. Tamera Harrison for consultation for a reverse total  shoulder.     Todd Alpha, DO PGY-4, Sports Medicine Fellow [place]  I observed and examined the patient with the [facility] resident and agree with assessment and plan.  Note reviewed and modified by me. Todd Mcgill, MD

## 2020-04-09 NOTE — Progress Notes (Signed)
Pre call done for endo procedure Monday 12/6, patient states he will get tomorrow and reminded him to stay quarantined over weekend. Patient states he has not missed any doses of his blood thinner recently and will take morning of procedure. Patient confirmed he has a driver home, and will be NPO past midnight Sunday. All questions addressed.

## 2020-04-10 ENCOUNTER — Other Ambulatory Visit (HOSPITAL_COMMUNITY)
Admission: RE | Admit: 2020-04-10 | Discharge: 2020-04-10 | Disposition: A | Discharge status: Home / Self Care | Payer: Medicare HMO | Source: Ambulatory Visit | Attending: Cardiology | Admitting: Cardiology

## 2020-04-10 DIAGNOSIS — Z20822 Contact with and (suspected) exposure to covid-19: Secondary | ICD-10-CM | POA: Insufficient documentation

## 2020-04-10 DIAGNOSIS — Z01812 Encounter for preprocedural laboratory examination: Secondary | ICD-10-CM | POA: Diagnosis present

## 2020-04-11 LAB — SARS CORONAVIRUS 2 (TAT 6-24 HRS): SARS Coronavirus 2: NEGATIVE

## 2020-04-11 NOTE — Anesthesia Preprocedure Evaluation (Addendum)
Anesthesia Evaluation  Patient identified by MRN, date of birth, ID band Patient awake    Reviewed: Allergy & Precautions, NPO status , Patient's Chart, lab work & pertinent test results  History of Anesthesia Complications Negative for: history of anesthetic complications  Airway Mallampati: II  TM Distance: >3 FB Neck ROM: Full    Dental no notable dental hx. (+) Dental Advisory Given   Pulmonary neg pulmonary ROS, former smoker,    Pulmonary exam normal        Cardiovascular + dysrhythmias Atrial Fibrillation  Rhythm:Regular Rate:Normal     Neuro/Psych negative neurological ROS  negative psych ROS   GI/Hepatic negative GI ROS, Neg liver ROS,   Endo/Other  Hypothyroidism   Renal/GU negative Renal ROS  negative genitourinary   Musculoskeletal negative musculoskeletal ROS (+)   Abdominal   Peds  Hematology negative hematology ROS (+)   Anesthesia Other Findings  EF 55-60%, mild-mod MR, AV sclerosis w/o stenosis, mild aortic root dilatation (42 mm)  Reproductive/Obstetrics                            Anesthesia Physical  Anesthesia Plan  ASA: III  Anesthesia Plan: General   Post-op Pain Management:    Induction: Intravenous  PONV Risk Score and Plan: 2 and TIVA and Treatment may vary due to age or medical condition  Airway Management Planned: Mask  Additional Equipment: None  Intra-op Plan:   Post-operative Plan:   Informed Consent: I have reviewed the patients History and Physical, chart, labs and discussed the procedure including the risks, benefits and alternatives for the proposed anesthesia with the patient or authorized representative who has indicated his/her understanding and acceptance.     Dental advisory given  Plan Discussed with: Anesthesiologist, CRNA and Surgeon  Anesthesia Plan Comments:        Anesthesia Quick Evaluation

## 2020-04-12 ENCOUNTER — Encounter (HOSPITAL_COMMUNITY)
Admission: RE | Disposition: A | Discharge status: Home / Self Care | Payer: Self-pay | Source: Home / Self Care | Attending: Cardiology

## 2020-04-12 ENCOUNTER — Ambulatory Visit (HOSPITAL_COMMUNITY): Payer: Medicare HMO | Admitting: Anesthesiology

## 2020-04-12 ENCOUNTER — Ambulatory Visit (HOSPITAL_COMMUNITY)
Admission: RE | Admit: 2020-04-12 | Discharge: 2020-04-12 | Disposition: A | Discharge status: Home / Self Care | Payer: Medicare HMO | Attending: Cardiology | Admitting: Cardiology

## 2020-04-12 ENCOUNTER — Other Ambulatory Visit: Payer: Self-pay

## 2020-04-12 ENCOUNTER — Encounter (HOSPITAL_COMMUNITY): Payer: Self-pay | Admitting: Cardiology

## 2020-04-12 DIAGNOSIS — Z79899 Other long term (current) drug therapy: Secondary | ICD-10-CM | POA: Insufficient documentation

## 2020-04-12 DIAGNOSIS — Z79890 Hormone replacement therapy (postmenopausal): Secondary | ICD-10-CM | POA: Insufficient documentation

## 2020-04-12 DIAGNOSIS — Z7901 Long term (current) use of anticoagulants: Secondary | ICD-10-CM | POA: Insufficient documentation

## 2020-04-12 DIAGNOSIS — Z87891 Personal history of nicotine dependence: Secondary | ICD-10-CM | POA: Insufficient documentation

## 2020-04-12 DIAGNOSIS — I4819 Other persistent atrial fibrillation: Secondary | ICD-10-CM | POA: Diagnosis not present

## 2020-04-12 HISTORY — PX: CARDIOVERSION: SHX1299

## 2020-04-12 LAB — POCT I-STAT, CHEM 8
BUN: 15 mg/dL (ref 8–23)
Calcium, Ion: 1.17 mmol/L (ref 1.15–1.40)
Chloride: 105 mmol/L (ref 98–111)
Creatinine, Ser: 1 mg/dL (ref 0.61–1.24)
Glucose, Bld: 102 mg/dL — ABNORMAL HIGH (ref 70–99)
HCT: 39 % (ref 39.0–52.0)
Hemoglobin: 13.3 g/dL (ref 13.0–17.0)
Potassium: 3.4 mmol/L — ABNORMAL LOW (ref 3.5–5.1)
Sodium: 139 mmol/L (ref 135–145)
TCO2: 22 mmol/L (ref 22–32)

## 2020-04-12 SURGERY — CARDIOVERSION
Anesthesia: General

## 2020-04-12 MED ORDER — PHENYLEPHRINE HCL (PRESSORS) 10 MG/ML IV SOLN
INTRAVENOUS | Status: DC | PRN
  Administered 2020-04-12: 80 mg via INTRAVENOUS

## 2020-04-12 MED ORDER — PROPOFOL 10 MG/ML IV BOLUS
INTRAVENOUS | Status: DC | PRN
  Administered 2020-04-12: 60 mg via INTRAVENOUS
  Administered 2020-04-12: 20 mg via INTRAVENOUS

## 2020-04-12 MED ORDER — LIDOCAINE HCL (CARDIAC) PF 100 MG/5ML IV SOSY
PREFILLED_SYRINGE | INTRAVENOUS | Status: DC | PRN
  Administered 2020-04-12: 5 mL via INTRATRACHEAL

## 2020-04-12 NOTE — CV Procedure (Signed)
Procedure:   DCCV  Indication:  Symptomatic atrial fibrillation  Procedure Note:  The patient signed informed consent.  They have had had therapeutic anticoagulation with apixaban greater than 3 weeks.  Anesthesia was administered by Dr. Tobias Alexander.  Patient received 5 ml 2%  IV lidocaine and 80 mg IV propofol.Adequate airway was maintained throughout and vital followed per protocol.  They were cardioverted x 3 with 150, 200, 200 J of biphasic synchronized energy with anterior pressure through a blanket.  They remained in atrial fibrillation.  There were no apparent complications.  The patient had normal neuro status and respiratory status post procedure with vitals stable as recorded elsewhere.    Follow up:  They will continue on current medical therapy and follow up with cardiology as scheduled.  Buford Dresser, MD PhD 04/12/2020 10:41 AM

## 2020-04-12 NOTE — Transfer of Care (Signed)
Immediate Anesthesia Transfer of Care Note  Patient: Todd Harrison  Procedure(s) Performed: CARDIOVERSION (N/A )  Patient Location: Endoscopy Unit  Anesthesia Type:MAC  Level of Consciousness: awake, alert  and sedated  Airway & Oxygen Therapy: Patient connected to nasal cannula oxygen  Post-op Assessment: Post -op Vital signs reviewed and stable  Post vital signs: stable  Last Vitals:  Vitals Value Taken Time  BP    Temp    Pulse    Resp    SpO2      Last Pain:  Vitals:   04/12/20 0958  TempSrc: Oral  PainSc: 0-No pain         Complications: No complications documented.

## 2020-04-12 NOTE — Anesthesia Procedure Notes (Signed)
Procedure Name: MAC Date/Time: 04/12/2020 10:41 AM Performed by: Lavell Luster, CRNA Pre-anesthesia Checklist: Patient identified, Emergency Drugs available, Suction available, Patient being monitored and Timeout performed Patient Re-evaluated:Patient Re-evaluated prior to induction Oxygen Delivery Method: Non-rebreather mask Preoxygenation: Pre-oxygenation with 100% oxygen Induction Type: IV induction Placement Confirmation: breath sounds checked- equal and bilateral and positive ETCO2 Dental Injury: Teeth and Oropharynx as per pre-operative assessment

## 2020-04-12 NOTE — Discharge Instructions (Signed)
Electrical Cardioversion Electrical cardioversion is the delivery of a jolt of electricity to restore a normal rhythm to the heart. A rhythm that is too fast or is not regular keeps the heart from pumping well. In this procedure, sticky patches or metal paddles are placed on the chest to deliver electricity to the heart from a device. This procedure may be done in an emergency if:  There is low or no blood pressure as a result of the heart rhythm.  Normal rhythm must be restored as fast as possible to protect the brain and heart from further damage.  It may save a life. This may also be a scheduled procedure for irregular or fast heart rhythms that are not immediately life-threatening. Tell a health care provider about:  Any allergies you have.  All medicines you are taking, including vitamins, herbs, eye drops, creams, and over-the-counter medicines.  Any problems you or family members have had with anesthetic medicines.  Any blood disorders you have.  Any surgeries you have had.  Any medical conditions you have.  Whether you are pregnant or may be pregnant. What are the risks? Generally, this is a safe procedure. However, problems may occur, including:  Allergic reactions to medicines.  A blood clot that breaks free and travels to other parts of your body.  The possible return of an abnormal heart rhythm within hours or days after the procedure.  Your heart stopping (cardiac arrest). This is rare. What happens before the procedure? Medicines  Your health care provider may have you start taking: ? Blood-thinning medicines (anticoagulants) so your blood does not clot as easily. ? Medicines to help stabilize your heart rate and rhythm.  Ask your health care provider about: ? Changing or stopping your regular medicines. This is especially important if you are taking diabetes medicines or blood thinners. ? Taking medicines such as aspirin and ibuprofen. These medicines can  thin your blood. Do not take these medicines unless your health care provider tells you to take them. ? Taking over-the-counter medicines, vitamins, herbs, and supplements. General instructions  Follow instructions from your health care provider about eating or drinking restrictions.  Plan to have someone take you home from the hospital or clinic.  If you will be going home right after the procedure, plan to have someone with you for 24 hours.  Ask your health care provider what steps will be taken to help prevent infection. These may include washing your skin with a germ-killing soap. What happens during the procedure?   An IV will be inserted into one of your veins.  Sticky patches (electrodes) or metal paddles may be placed on your chest.  You will be given a medicine to help you relax (sedative).  An electrical shock will be delivered. The procedure may vary among health care providers and hospitals. What can I expect after the procedure?  Your blood pressure, heart rate, breathing rate, and blood oxygen level will be monitored until you leave the hospital or clinic.  Your heart rhythm will be watched to make sure it does not change.  You may have some redness on the skin where the shocks were given. Follow these instructions at home:  Do not drive for 24 hours if you were given a sedative during your procedure.  Take over-the-counter and prescription medicines only as told by your health care provider.  Ask your health care provider how to check your pulse. Check it often.  Rest for 48 hours after the procedure or   as told by your health care provider.  Avoid or limit your caffeine use as told by your health care provider.  Keep all follow-up visits as told by your health care provider. This is important. Contact a health care provider if:  You feel like your heart is beating too quickly or your pulse is not regular.  You have a serious muscle cramp that does not go  away. Get help right away if:  You have discomfort in your chest.  You are dizzy or you feel faint.  You have trouble breathing or you are short of breath.  Your speech is slurred.  You have trouble moving an arm or leg on one side of your body.  Your fingers or toes turn cold or blue. Summary  Electrical cardioversion is the delivery of a jolt of electricity to restore a normal rhythm to the heart.  This procedure may be done right away in an emergency or may be a scheduled procedure if the condition is not an emergency.  Generally, this is a safe procedure.  After the procedure, check your pulse often as told by your health care provider. This information is not intended to replace advice given to you by your health care provider. Make sure you discuss any questions you have with your health care provider. Document Revised: 11/25/2018 Document Reviewed: 11/25/2018 Elsevier Patient Education  2020 Elsevier Inc.  

## 2020-04-12 NOTE — Interval H&P Note (Signed)
History and Physical Interval Note:  04/12/2020 10:41 AM  Todd Harrison  has presented today for surgery, with the diagnosis of AFIB.  The various methods of treatment have been discussed with the patient and family. After consideration of risks, benefits and other options for treatment, the patient has consented to  Procedure(s): CARDIOVERSION (N/A) as a surgical intervention.  The patient's history has been reviewed, patient examined, no change in status, stable for surgery.  I have reviewed the patient's chart and labs.  Questions were answered to the patient's satisfaction.     Aedon Deason Harrell Gave

## 2020-04-12 NOTE — Anesthesia Postprocedure Evaluation (Signed)
Anesthesia Post Note  Patient: Todd Harrison  Procedure(s) Performed: CARDIOVERSION (N/A )     Patient location during evaluation: PACU Anesthesia Type: General Level of consciousness: sedated Pain management: pain level controlled Vital Signs Assessment: post-procedure vital signs reviewed and stable Respiratory status: spontaneous breathing and respiratory function stable Cardiovascular status: stable Postop Assessment: no apparent nausea or vomiting Anesthetic complications: no   No complications documented.  Last Vitals:  Vitals:   04/12/20 1110 04/12/20 1120  BP: (!) 113/57 109/66  Pulse: 66 66  Resp: (!) 21 19  Temp:    SpO2: 95% 96%    Last Pain:  Vitals:   04/12/20 1120  TempSrc:   PainSc: 0-No pain                 Lusero Nordlund DANIEL

## 2020-04-13 ENCOUNTER — Ambulatory Visit: Payer: Medicare HMO | Admitting: Cardiology

## 2020-04-27 ENCOUNTER — Other Ambulatory Visit: Payer: Self-pay

## 2020-04-27 ENCOUNTER — Ambulatory Visit: Payer: Medicare HMO | Admitting: Sports Medicine

## 2020-04-27 VITALS — BP 109/73 | Ht 72.0 in | Wt 300.0 lb

## 2020-04-27 DIAGNOSIS — M75121 Complete rotator cuff tear or rupture of right shoulder, not specified as traumatic: Secondary | ICD-10-CM | POA: Diagnosis not present

## 2020-04-27 NOTE — Progress Notes (Signed)
Cardiology Office Note:    Date:  04/28/2020   ID:  Todd Harrison, DOB [birth year], MRN [id number]  PCP:  Lorene Dy, MD  [facility] Cardiologist:  Freada Bergeron, MD  [facility] Electrophysiologist:  None   Referring MD: Lorene Dy, MD    History of Present Illness:    Todd Harrison is a 74 y.o. male with a hx of lichen planus on methotrexate and chronic prednisone, hypothyroidism, and newly diagnosed Afib with failed DCCV x2 (initial return to NSR on first DCCV but reverted back to Afib) who presents to clinic for follow-up.  Patient was diagnosed with left sided pneumonia and acute asthma exacerbation in 01/2020. He was treated with medrol dose pack and levaquin with modest improvement. He continued to have a productive cough and was following up with his pulmonologist when he was noted to be in Afib with RVR to 130s for which he was transferred to the ED.  During his hospitalization I September 2021, the patient was rate controlled initially with a dilt gtt later transitioned to cardizem and bystolic. He was initiated on eliquis for anticoagulation with plans to DCCV once he received 3 weeks of AC.  He returned for DCCV on 10/18 where he initially converted to NSR but rapidly returned back into Afib. At our follow-up visit on 03/11/20, he remained symptomatic in Afib. We loaded him with amiodarone and re-attempted DCCV on 12/06 but without success. He now returns to clinic for follow-up.  Today, the patient feels much better. No chest pain, palpitations, shortness of breath. Continues to have LE edema and takes lasix as needed. Fatigue and cough improved. Did cologuard and found to have blood in the stool. He does not have any melena, BRBPR, or hematuria. He takes famtodine. Blood pressure well controlled at home. Overall, the patient states that he is significantly improved compared to the last couple of months.  Past Medical History:  Diagnosis Date  .  Lichen planus   . Methotrexate, long term, current use   . Psoriasis   . Thyroid disease     Past Surgical History:  Procedure Laterality Date  . CARDIOVERSION N/A 02/23/2020   Procedure: CARDIOVERSION;  Surgeon: Werner Lean, MD;  Location: [facility];  Service: Cardiovascular;  Laterality: N/A;  . CARDIOVERSION N/A 04/12/2020   Procedure: CARDIOVERSION;  Surgeon: Buford Dresser, MD;  Location: [facility] END[facility]COPY;  Service: Cardiovascular;  Laterality: N/A;  . CATARACT EXTRACTION Left    2019    Current Medications: Current Meds  Medication Sig  . acetaminophen (TYLENOL) 500 MG tablet Take 1,000 mg by mouth every 6 (six) hours as needed for moderate pain or headache.  . albuterol (PROVENTIL) (2.5 MG/3ML) 0.083% nebulizer solution Take 2.5 mg by nebulization every 6 (six) hours as needed for wheezing or shortness of breath.  Marland Kitchen albuterol (VENTOLIN HFA) 108 (90 Base) MCG/ACT inhaler Inhale 2 puffs into the lungs every 6 (six) hours as needed for wheezing or shortness of breath.  Marland Kitchen apixaban (ELIQUIS) 5 MG TABS tablet Take 1 tablet (5 mg total) by mouth 2 (two) times daily.  Marland Kitchen augmented betamethasone dipropionate (DIPROLENE-AF) 0.05 % ointment Apply 1 application topically in the morning and at bedtime.   Marland Kitchen azelastine (ASTELIN) 0.1 % nasal spray Place 1 spray into both nostrils daily. Use in each nostril as directed  . B Complex Vitamins (VITAMIN B COMPLEX PO) Take 1 tablet by mouth every other day.   . famotidine (PEPCID) 20 MG tablet Take 20  mg by mouth 2 (two) times daily.  . furosemide (LASIX) 20 MG tablet Take one tablet by mouth daily for 3 days and then take daily as needed for shortness of breath or swelling.  . ISOtretinoin (ACCUTANE) 40 MG capsule Take 40 mg by mouth 2 (two) times daily.   Marland Kitchen levothyroxine (SYNTHROID, LEVOTHROID) 100 MCG tablet Take 100 mcg by mouth daily before breakfast.   . loratadine (CLARITIN) 10 MG tablet Take 10 mg by mouth daily.   .  Melatonin 5 MG CAPS Take 5-10 mg by mouth at bedtime.  . metoprolol succinate (TOPROL-XL) 50 MG 24 hr tablet Take 1 tablet (50 mg total) by mouth daily. Take with or immediately following a meal.  . montelukast (SINGULAIR) 10 MG tablet TAKE 1 TABLET BY MOUTH EVERYDAY AT BEDTIME  . mycophenolate (CELLCEPT) 500 MG tablet Take 500 mg by mouth 2 (two) times daily.  Marland Kitchen triamcinolone (KENALOG) 0.1 % Apply 1 application topically daily as needed (lichen planus flare).  . triamcinolone ointment (KENALOG) 0.1 % Apply 1 application topically daily as needed (lichen planus on feet).  . [DISCONTINUED] amiodarone (PACERONE) 400 MG tablet Take 0.5 tablets (200 mg total) by mouth 2 (two) times daily.     Allergies:   Gold and Mixed grasses   Social History   Socioeconomic History  . Marital status: Married    Spouse name: Not on file  . Number of children: Not on file  . Years of education: Not on file  . Highest education level: Not on file  Occupational History  . Not on file  Tobacco Use  . Smoking status: Former Smoker    Types: Cigarettes    Quit date: 1982    Years since quitting: 40.0  . Smokeless tobacco: Never Used  Vaping Use  . Vaping Use: Not on file  Substance and Sexual Activity  . Alcohol use: Yes    Alcohol/week: 1.0 standard drink    Types: 1 Glasses of wine per week  . Drug use: No  . Sexual activity: Not on file  Other Topics Concern  . Not on file  Social History Narrative  . Not on file   Social Determinants of Health   Financial Resource Strain: Not on file  Food Insecurity: Not on file  Transportation Needs: Not on file  Physical Activity: Not on file  Stress: Not on file  Social Connections: Not on file     Family History: The patient's family history includes Diabetes in his mother; Stroke in his mother.  ROS:   Please see the history of present illness.    Review of Systems  Constitutional: Negative for chills and fever.  HENT: Negative for  congestion.   Eyes: Negative for blurred vision.  Respiratory: Positive for cough. Negative for sputum production and shortness of breath.   Cardiovascular: Positive for leg swelling. Negative for chest pain, palpitations, orthopnea, claudication and PND.  Gastrointestinal: Negative for heartburn, nausea and vomiting.  Genitourinary: Negative for hematuria.  Musculoskeletal: Positive for myalgias.  Neurological: Negative for dizziness and loss of consciousness.  Endo/Heme/Allergies: Negative for polydipsia.  Psychiatric/Behavioral: Negative for depression.    EKGs/Labs/Other Studies Reviewed:    The following studies were reviewed today: TTE 28-Jan-2020: 1. Left ventricular ejection fraction, by estimation, is 55 to 60%. The  left ventricle has normal function. The left ventricle has no regional  wall motion abnormalities. There is mild concentric left ventricular  hypertrophy. Left ventricular diastolic  function could not be evaluated.  2. Right ventricular systolic function is normal. The right ventricular  size is normal. Tricuspid regurgitation signal is inadequate for assessing  PA pressure.  3. The mitral valve is grossly normal. Mild to moderate mitral valve  regurgitation. No evidence of mitral stenosis.  4. The aortic valve is tricuspid. There is moderate calcification of the  aortic valve. There is moderate thickening of the aortic valve. Aortic  valve regurgitation is not visualized. Mild to moderate aortic valve  sclerosis/calcification is present,  without any evidence of aortic stenosis.  5. Aortic dilatation noted. There is mild dilatation of the aortic root,  measuring 42 mm.  6. The inferior vena cava is dilated in size with >50% respiratory  variability, suggesting right atrial pressure of 8 mmHg.   CT Chest 01/22/20: FINDINGS: Cardiovascular: There are no significant vascular findings. Scattered vascular calcifications are seen within the aorta. Coronary  artery calcifications are seen. The heart size is normal. There is no pericardial thickening or effusion.  Mediastinum/Nodes: There are no enlarged mediastinal, hilar or axillary lymph nodes. The thyroid gland, trachea and esophagus demonstrate no significant findings.  Lungs/Pleura: Rounded patchy airspace opacity is seen within the posterior left lung base with mild bronchial wall thickening. There is small tree-in-bud opacities also noted within the right lung base.  Upper abdomen: The visualized portion of the upper abdomen is unremarkable.  Musculoskeletal/Chest wall: There is no chest wall mass or suspicious osseous finding. No acute osseous abnormality  IMPRESSION: Patchy airspace opacity with tree-in-bud opacities at the posterior left lung base which could be due to resolving infectious etiology from August 2021 or inflammatory process.   EKG:  EKG is  ordered today.  The ekg ordered today demonstrates NSR with HR 70  Recent Labs: 01/22/2020: B Natriuretic Peptide 184.6; Magnesium 2.1; TSH 1.171 03/18/2020: ALT 19 03/29/2020: Platelets 229 04/12/2020: BUN 15; Creatinine, Ser 1.00; Hemoglobin 13.3; Potassium 3.4; Sodium 139  Recent Lipid Panel No results found for: CHOL, TRIG, HDL, CHOLHDL, VLDL, LDLCALC, LDLDIRECT   Risk Assessment/Calculations:     CHA2DS2-VASc Score = 2  This indicates a 2.2% annual risk of stroke. The patient's score is based upon: CHF History: No HTN History: No Diabetes History: No Stroke History: No Vascular Disease History: Yes Age Score: 1 Gender Score: 0    Physical Exam:    VS:  BP 104/68   Pulse 75   Ht 6' (1.829 m)   Wt (!) 302 lb 9.6 oz (137.3 kg)   SpO2 92%   BMI 41.04 kg/m     Wt Readings from Last 3 Encounters:  04/28/20 (!) 302 lb 9.6 oz (137.3 kg)  04/27/20 300 lb (136.1 kg)  04/12/20 300 lb (136.1 kg)     GEN:  Well nourished, well developed in no acute distress HEENT: Normal NECK: No JVD; No carotid  bruits CARDIAC: RRR, no murmurs, rubs, gallops RESPIRATORY:  Clear to auscultation without rales, wheezing or rhonchi  ABDOMEN: Soft, non-tender, non-distended MUSCULOSKELETAL:  1+ pitting edema with R>L; No deformity  SKIN: Warm and dry NEUROLOGIC:  Alert and oriented x 3 PSYCHIATRIC:  Normal affect   ASSESSMENT:    1. Persistent atrial fibrillation ([place])   2. Secondary hypercoagulable state ([place])   3. Hyperlipidemia, unspecified hyperlipidemia type   4. Aortic root aneurysm ([place])   5. Lower extremity edema   6. Moderate mitral regurgitation    PLAN:    In order of problems listed above:  #Persistent Atrial fibrillation: CHADs-vasc 2. On anticoagulation with apixaban  5mg  BID. Failed DCCV on 02/23/20 (initial return to NSR but rapidly flipped back into Afib) and 04/12/20 after amiodarone load. Back in NSR today. On metop and amiodarone. -Continue eliquis 5mg  BID -Decrease amiodarone to 200mg  daily -Continue metop 50mg  XL; HR 70s -Normal BiV function and atrial size on TTE -Stopped dilt as did not want to cause bradycardia in the setting of metop and amiodarone -Continue famotidine; unable to take PPI as interacts with methotrexate; HgB stable -Follows with Afib clinic, appreciate assistance -Will refer to EP for further evaluation  #Coronary calcification: #HLD: Noted on CT chest. No exertional symptoms. LDL 127. -Start crestor 10mg  daily -Repeat labs in 6 weeks  #Trace blood in stool: Noted on cologuard report. Patient denies any melena, BRBPR, or hematuria. HgB improved on last labs to 13.3 on 12/6.  -Continue famotidine 20mg  BID -Follow-up with PCP as scheduled  #LE edema: TTE with normal BiV function, mild to moderate MR -Continue intermittent lasix as needed  #Mild-to-Moderate MR: Noted on TTE on 01/2020. -Needs surveillance TTE every 2-3 years  #Aortic root aneurysm (4.24mm): Noted on TTE on 01/2020. -Yearly surveillance imaging -Blood pressure control  with metoprolol; well controlled today -Advised against heavy lifting   #Lichen Planus: On chronic methotrexate. Off steroids now. -Follow-up with Derm as scheduled  #Chronic cough: Much improved.  -Continue follow-up with pulm and ENT as scheduled   Medication Adjustments/Labs and Tests Ordered: Current medicines are reviewed at length with the patient today.  Concerns regarding medicines are outlined above.  Orders Placed This Encounter  Procedures  . Lipid panel  . Hepatic function panel  . Ambulatory referral to Cardiac Electrophysiology  . EKG 12-Lead   Meds ordered this encounter  Medications  . rosuvastatin (CRESTOR) 10 MG tablet    Sig: Take 1 tablet (10 mg total) by mouth daily.    Dispense:  90 tablet    Refill:  3  . amiodarone (PACERONE) 400 MG tablet    Sig: Take 0.5 tablets (200 mg total) by mouth daily.    Dispense:  45 tablet    Refill:  3    Patient Instructions  Medication Instructions:  Your physician has recommended you make the following change in your medication:   START: Crestor10mg  daily DECREASE: Amiodarone to 200mg  daily  *If you need a refill on your cardiac medications before your next appointment, please call your pharmacy*   Lab Work: Lipids and LFT's in 6 weeks  If you have labs (blood work) drawn today and your tests are completely normal, you will receive your results only by: Marland Kitchen MyChart Message (if you have MyChart) OR . A paper copy in the mail If you have any lab test that is abnormal or we need to change your treatment, we will call you to review the results.   Follow-Up: At [facility], you and your health needs are our priority.  As part of our continuing mission to provide you with exceptional heart care, we have created designated Provider Care Teams.  These Care Teams include your primary Cardiologist (physician) and Advanced Practice Providers (APPs -  Physician Assistants and Nurse Practitioners) who all work  together to provide you with the care you need, when you need it.  We recommend signing up for the patient portal called "MyChart".  Sign up information is provided on this After Visit Summary.  MyChart is used to connect with patients for Virtual Visits (Telemedicine).  Patients are able to view lab/test results, encounter notes, upcoming appointments,  etc.  Non-urgent messages can be sent to your provider as well.   To learn more about what you can do with MyChart, go to [email or web address].    Your next appointment:   6 month(s)  The format for your next appointment:   In Person  Provider:   You may see Freada Bergeron, MD or one of the following Advanced Practice Providers on your designated Care Team:    Richardson Dopp, PA-C  Robbie Lis, [place]     Signed, Freada Bergeron, MD  04/28/2020 9:37 AM    [place]

## 2020-04-27 NOTE — Assessment & Plan Note (Signed)
Improving. He has improved active ROM however still significant weakness. No pain.  - cont with exercises of the shoulder and voltaren gel as needed - f/u in 4-6 weeks

## 2020-04-27 NOTE — Progress Notes (Signed)
    SUBJECTIVE:   CHIEF COMPLAINT / HPI:   F/u for Right Rotator Cuff Tear Patient is doing well and has been doing exercises at home. He no longer has any pain and has increased range of motion compared to last time he was here. He still feels weak with overhead movements and states in his current condition he does not think he could return to tennis. Overall, his goal is to return to playing tennis.  He also now endorses lateral posterior hip pain with activity and difficulty balancing or standing on the left leg. He states the pain is over the outside and back of his hip.  PERTINENT  PMH / PSH: Hx of Lichen Planus  OBJECTIVE:   BP 109/73   Ht 6' (1.829 m)   Wt 300 lb (136.1 kg)   BMI 40.69 kg/m   [facility] 03/30/2020  Frequency of aerobic exercise (# of days/week) 0  Average time in minutes 0  Frequency of strengthening activities (# of days/week) 0   Shoulder, Right: No evidence of bony deformity, asymmetry, or muscle atrophy; No tenderness over long head of biceps (bicipital groove). No TTP at [facility] joint. Limited active and passive range of motion but can attain flexion to 160 degrees and abduction to about 140 degrees. Thumb to T12 without significant tenderness. Continued weakness when placed in flexion and abduction, otherwise, strength 5/5 throughout. No abnormal scapular function observed. Sensation intact. Peripheral pulses intact.  Left Hip: No pain with internal or external rotation. Negative log roll test. Negative Ober's test. Negative straight leg raise test. Hip abduction is weak, otherwise hip strength is 5/5.    ASSESSMENT/PLAN:   Rotator cuff tear, right Improving. He has improved active ROM however still significant weakness. No pain.  - cont with exercises of the shoulder and voltaren gel as needed - f/u in 4-6 weeks   Left Hip weakness Patient given hip abduction exercises to do today. F/u in 4-6 weeks.  Nuala Alpha,  DO PGY-4, Sports Medicine Fellow [place]  I observed and examined the patient with the [facility] resident and agree with assessment and plan.  Note reviewed and modified by me. Ila Mcgill, MD

## 2020-04-27 NOTE — Patient Instructions (Signed)
It was great to see you today! Thank you for letting me participate in your care!  Today, we discussed your improving pain and function in your right shoulder. Keep up the exercises as this will be very important for you in order to return to the most function and strength possible.  Your hip abductors are weak. Please do the exercises for these as well. We will see you back in 6 weeks.  Happy Holidays and Merry Christmas!!!  Be well, Todd Rutherford, DO PGY-4, Sports Medicine Fellow [place]

## 2020-04-28 ENCOUNTER — Telehealth: Payer: Self-pay | Admitting: Pharmacist

## 2020-04-28 ENCOUNTER — Ambulatory Visit: Payer: Medicare HMO | Admitting: Cardiology

## 2020-04-28 ENCOUNTER — Other Ambulatory Visit: Payer: Self-pay

## 2020-04-28 ENCOUNTER — Encounter: Payer: Self-pay | Admitting: Cardiology

## 2020-04-28 ENCOUNTER — Encounter: Payer: Self-pay | Admitting: Pharmacist

## 2020-04-28 ENCOUNTER — Ambulatory Visit: Payer: Medicare HMO | Admitting: Adult Health

## 2020-04-28 VITALS — BP 104/68 | HR 75 | Ht 72.0 in | Wt 302.6 lb

## 2020-04-28 DIAGNOSIS — I7121 Aneurysm of the ascending aorta, without rupture: Secondary | ICD-10-CM

## 2020-04-28 DIAGNOSIS — I719 Aortic aneurysm of unspecified site, without rupture: Secondary | ICD-10-CM | POA: Diagnosis not present

## 2020-04-28 DIAGNOSIS — E785 Hyperlipidemia, unspecified: Secondary | ICD-10-CM | POA: Diagnosis not present

## 2020-04-28 DIAGNOSIS — I4819 Other persistent atrial fibrillation: Secondary | ICD-10-CM | POA: Diagnosis not present

## 2020-04-28 DIAGNOSIS — D6869 Other thrombophilia: Secondary | ICD-10-CM

## 2020-04-28 DIAGNOSIS — Q2543 Congenital aneurysm of aorta: Secondary | ICD-10-CM

## 2020-04-28 DIAGNOSIS — R600 Localized edema: Secondary | ICD-10-CM

## 2020-04-28 DIAGNOSIS — I340 Nonrheumatic mitral (valve) insufficiency: Secondary | ICD-10-CM

## 2020-04-28 MED ORDER — ROSUVASTATIN CALCIUM 10 MG PO TABS: 10.0000 mg | ORAL_TABLET | Freq: Every day | ORAL | 3 refills | Status: DC

## 2020-04-28 MED ORDER — AMIODARONE HCL 400 MG PO TABS
200.0000 mg | ORAL_TABLET | Freq: Every day | ORAL | 3 refills | Status: DC
Start: 2020-04-28 — End: 2020-05-20

## 2020-04-28 NOTE — Telephone Encounter (Signed)
Spoke with patient in room on request from Dr Johney Frame.  Patient reports his monthly price for Eliquis is ~130 dollars.  Is also on Accutane so his monthly prescription cost is high.  Checked insurance formulary and Eliquis is tier 3, as is Xarelto.  Discussed patient assistance with patient and he believes he makes too much money when he saw the qualifications.  Recommended patient contact his plan to see if [facility] is his preferred pharmacy (which it likely is) and to see if going through mail order may be cheaper.  If prices are the same, suggested we can attempt to do a tier exception for patient but that is not guaranteed.  Gave patient my phone number and he voiced understanding and was appreciative.

## 2020-04-28 NOTE — Patient Instructions (Signed)
Medication Instructions:  Your physician has recommended you make the following change in your medication:   START: Crestor10mg  daily DECREASE: Amiodarone to 200mg  daily  *If you need a refill on your cardiac medications before your next appointment, please call your pharmacy*   Lab Work: Lipids and LFT's in 6 weeks  If you have labs (blood work) drawn today and your tests are completely normal, you will receive your results only by:  [place] (if you have MyChart) OR  A paper copy in the mail If you have any lab test that is abnormal or we need to change your treatment, we will call you to review the results.   Follow-Up: At [facility], you and your health needs are our priority.  As part of our continuing mission to provide you with exceptional heart care, we have created designated Provider Care Teams.  These Care Teams include your primary Cardiologist (physician) and Advanced Practice Providers (APPs -  Physician Assistants and Nurse Practitioners) who all work together to provide you with the care you need, when you need it.  We recommend signing up for the patient portal called "MyChart".  Sign up information is provided on this After Visit Summary.  MyChart is used to connect with patients for Virtual Visits (Telemedicine).  Patients are able to view lab/test results, encounter notes, upcoming appointments, etc.  Non-urgent messages can be sent to your provider as well.   To learn more about what you can do with MyChart, go to [email or web address].    Your next appointment:   6 month(s)  The format for your next appointment:   In Person  Provider:   You may see Freada Bergeron, MD or one of the following Advanced Practice Providers on your designated Care Team:    Richardson Dopp, PA-C  Vin Manzanola, [place]

## 2020-05-17 ENCOUNTER — Ambulatory Visit
Admission: RE | Admit: 2020-05-17 | Discharge: 2020-05-17 | Disposition: A | Discharge status: Home / Self Care | Payer: Medicare HMO | Source: Ambulatory Visit | Attending: Adult Health | Admitting: Adult Health

## 2020-05-17 DIAGNOSIS — J189 Pneumonia, unspecified organism: Secondary | ICD-10-CM

## 2020-05-19 ENCOUNTER — Ambulatory Visit (INDEPENDENT_AMBULATORY_CARE_PROVIDER_SITE_OTHER): Payer: Medicare HMO | Admitting: Adult Health

## 2020-05-19 ENCOUNTER — Telehealth: Payer: Self-pay | Admitting: *Deleted

## 2020-05-19 ENCOUNTER — Other Ambulatory Visit: Payer: Self-pay

## 2020-05-19 ENCOUNTER — Encounter: Payer: Self-pay | Admitting: Adult Health

## 2020-05-19 ENCOUNTER — Ambulatory Visit: Payer: Medicare HMO | Admitting: Adult Health

## 2020-05-19 DIAGNOSIS — R058 Other specified cough: Secondary | ICD-10-CM

## 2020-05-19 DIAGNOSIS — J189 Pneumonia, unspecified organism: Secondary | ICD-10-CM | POA: Diagnosis not present

## 2020-05-19 DIAGNOSIS — G4733 Obstructive sleep apnea (adult) (pediatric): Secondary | ICD-10-CM | POA: Diagnosis not present

## 2020-05-19 DIAGNOSIS — I4819 Other persistent atrial fibrillation: Secondary | ICD-10-CM | POA: Diagnosis not present

## 2020-05-19 DIAGNOSIS — J4530 Mild persistent asthma, uncomplicated: Secondary | ICD-10-CM

## 2020-05-19 DIAGNOSIS — R059 Cough, unspecified: Secondary | ICD-10-CM

## 2020-05-19 DIAGNOSIS — R062 Wheezing: Secondary | ICD-10-CM

## 2020-05-19 DIAGNOSIS — R0989 Other specified symptoms and signs involving the circulatory and respiratory systems: Secondary | ICD-10-CM

## 2020-05-19 DIAGNOSIS — J849 Interstitial pulmonary disease, unspecified: Secondary | ICD-10-CM

## 2020-05-19 MED ORDER — BUDESONIDE-FORMOTEROL FUMARATE 160-4.5 MCG/ACT IN AERO: 2.0000 | INHALATION_SPRAY | Freq: Two times a day (BID) | RESPIRATORY_TRACT | 6 refills | Status: DC

## 2020-05-19 MED ORDER — MONTELUKAST SODIUM 10 MG PO TABS: ORAL_TABLET | ORAL | 1 refills | Status: DC

## 2020-05-19 NOTE — Patient Instructions (Addendum)
Set up HRCT Chest in 3 months PFT in in 3 months .  Symbicort 160 2 puffs Twice daily- to use if Asthma flares  Albuterol inhaler As needed  Wheezing  Albuterol Nebs every 6hr as needed for wheezing .  Continue on Singulair 10mg  At bedtime .  Zyrtec 10mg  At bedtime As needed   Saline nasal spray Laretta Alstrom Twice daily  .  Flonase 2 puff daily As needed   Mucinex DM Twice daily  As needed  Cough/congestion  Pepcid 20mg  Twice daily.  Begin CPAP when  Available  Consider Dream wear nasal mask Wear CPAP all night long .  Work on healthy weight  Do not drive if sleepy .  Follow up with Dr. Valeta Harms in 3 months and As needed   Please contact office for sooner follow up if symptoms do not improve or worsen or seek emergency care

## 2020-05-19 NOTE — Telephone Encounter (Signed)
Called and spoke with Estill Bamberg with [employer] regarding the status of the CPAP, advised that it has been processed and they are waiting for it to be shipped.  They did not have an estimated time for delivery.  Nothing further needed.

## 2020-05-19 NOTE — Assessment & Plan Note (Signed)
Newly diagnosed severe obstructive sleep apnea with desaturations.  Patient is to begin nocturnal CPAP.  Unfortunately this was ordered in November 2021.  Due to the backorder of CPAP machines he has not received his new CPAP as of today.  We will call the local DME for status on his delivery date. Patient education on sleep apnea and CPAP care.  Plan  Patient Instructions  Set up HRCT Chest in 3 months PFT in in 3 months .  Symbicort 160 2 puffs Twice daily- to use if Asthma flares  Albuterol inhaler As needed  Wheezing  Albuterol Nebs every 6hr as needed for wheezing .  Continue on Singulair 10mg  At bedtime .  Zyrtec 10mg  At bedtime As needed   Saline nasal spray Laretta Alstrom Twice daily  .  Flonase 2 puff daily As needed   Mucinex DM Twice daily  As needed  Cough/congestion  Pepcid 20mg  Twice daily.  Begin CPAP when  Available  Consider Dream wear nasal mask Wear CPAP all night long .  Work on healthy weight  Do not drive if sleepy .  Follow up with Dr. Valeta Harms in 3 months and As needed   Please contact office for sooner follow up if symptoms do not improve or worsen or seek emergency care

## 2020-05-19 NOTE — Assessment & Plan Note (Signed)
Recent new onset A. fib during acute illness with pneumonia and asthmatic bronchitis.  Patient is following with cardiology.  Now on amiodarone.  Unfortunately failed cardioversion x2.  Continue follow-up with cardiology.  Maintain maintenance regimen with current medications.

## 2020-05-19 NOTE — Addendum Note (Signed)
Addended by: Vanessa Barbara on: 05/19/2020 10:54 AM   Modules accepted: Orders

## 2020-05-19 NOTE — Progress Notes (Signed)
@Patient  ID: Todd Harrison, male    DOB: 1945/08/24, 75 y.o.   MRN: [id number]  Chief Complaint  Patient presents with  . Follow-up  . Asthma    Referring provider: Lorene Dy, MD  HPI: 75 year old male former smoker seen for pulmonary consult April 15, 2019 for cough and asthma Medical history significant for lichen planus currently on CellCept (previously on methotrexate) Patient is a Engineer, technical sales. Works in [employer] .   TEST/EVENTS :  June 12, 2019 that showed normal lung function with FEV1 at 87%, ratio 76, FVC 84%. No significant bronchodilator response. DLCO was 87%.   Chest x-ray done October 17, 2019 showed no acute process.  CT sinus December 06, 2019 paranasal sinuses well aerated. No evidence of sinusitis. Nasal septal deviation leftward  Sputum culture October 24, 2019 growth of normal oral flora.  2D echo January 23, 2020 showed EF 55 to [id number], RV systolic function is normal.  RV size is normal.  Moderate calcification of the aortic valve and thickening.  Mild to moderate aortic valve sclerosis.  No evidence of aortic stenosis.    05/19/2020 Follow up : Pneumonia and asthma Patient returns for a 63-month follow-up.  Last visit patient was having a slow to resolve asthmatic bronchitic exacerbation with superimposed pneumonia.  This was also complicated by new onset A. fib with RVR.  He did require hospitalization.  Was started on Cardizem and anticoagulation.  He did undergo outpatient cardioversion.  Unfortunately he continued in A. fib.  He was started on amiodarone.  He continues on anticoagulation with Eliquis. He was treated with IV antibiotics and steroids.  Was also treated for a superimposed sinusitis by ENT.  During hospitalization modified barium swallow showed some mild dysphagia and aspiration risk.  He was placed on GERD diet and aspiration precautions. Since last office vis he is feeling much better back to his baseline. No flare of cough  or wheezing .  CT chest May 17, 2020 showed resolution of left lower lobe consolidation.  Increased peripheral interstitial process along the lung bases with subpleural reticulation and groundglass opacity.  Questionable underlying interstitial lung process. Patient says his appetite is good.  No nausea or vomiting.  Says breathing has improved substantially. It is no longer using Symbicort.  No albuterol use.  Patient was evaluated for possible sleep apnea with his recent diagnosis of new onset sleep apnea.  He did have some restless sleep and snoring.  He was set up for a home sleep study. HST showed severe OSA and moderately severe O2 desats. . Was set up for CPAP but has not received due to shortage.   Remains on Cellcept and Accutane for lichen planus .  Patient has chronic foot pain from his lichen planus is his main limiting factor he is not able to be active due to this chronic pain in his feet.     Allergies  Allergen Reactions  . Gold Itching  . Mixed Grasses     unknown    Immunization History  Administered Date(s) Administered  . Fluad Quad(high Dose 65+) 03/04/2019, 01/25/2020  . Influenza-Unspecified 03/08/2016  . PFIZER SARS-COV-2 Vaccination 06/13/2019, 07/04/2019, 04/07/2020  . Pneumococcal Polysaccharide-23 01/25/2020    Past Medical History:  Diagnosis Date  . Lichen planus   . Methotrexate, long term, current use   . Psoriasis   . Thyroid disease     Tobacco History: Social History   Tobacco Use  Smoking Status Former Smoker  . Types:  Cigarettes  . Quit date: 52  . Years since quitting: 40.0  Smokeless Tobacco Never Used   Counseling given: Not Answered   Outpatient Medications Prior to Visit  Medication Sig Dispense Refill  . acetaminophen (TYLENOL) 500 MG tablet Take 1,000 mg by mouth every 6 (six) hours as needed for moderate pain or headache.    . albuterol (PROVENTIL) (2.5 MG/3ML) 0.083% nebulizer solution Take 2.5 mg by nebulization  every 6 (six) hours as needed for wheezing or shortness of breath.    Marland Kitchen albuterol (VENTOLIN HFA) 108 (90 Base) MCG/ACT inhaler Inhale 2 puffs into the lungs every 6 (six) hours as needed for wheezing or shortness of breath. 18 g 3  . amiodarone (PACERONE) 400 MG tablet Take 0.5 tablets (200 mg total) by mouth daily. 45 tablet 3  . apixaban (ELIQUIS) 5 MG TABS tablet Take 1 tablet (5 mg total) by mouth 2 (two) times daily. 180 tablet 3  . augmented betamethasone dipropionate (DIPROLENE-AF) 0.05 % ointment Apply 1 application topically in the morning and at bedtime.     Marland Kitchen azelastine (ASTELIN) 0.1 % nasal spray Place 1 spray into both nostrils daily. Use in each nostril as directed    . B Complex Vitamins (VITAMIN B COMPLEX PO) Take 1 tablet by mouth every other day.     . famotidine (PEPCID) 20 MG tablet Take 20 mg by mouth 2 (two) times daily.    . furosemide (LASIX) 20 MG tablet Take one tablet by mouth daily for 3 days and then take daily as needed for shortness of breath or swelling. 90 tablet 3  . ISOtretinoin (ACCUTANE) 40 MG capsule Take 40 mg by mouth 2 (two) times daily.     Marland Kitchen levothyroxine (SYNTHROID, LEVOTHROID) 100 MCG tablet Take 100 mcg by mouth daily before breakfast.     . loratadine (CLARITIN) 10 MG tablet Take 10 mg by mouth daily.     . Melatonin 5 MG CAPS Take 5-10 mg by mouth at bedtime.    . metoprolol succinate (TOPROL-XL) 50 MG 24 hr tablet Take 1 tablet (50 mg total) by mouth daily. Take with or immediately following a meal. 90 tablet 3  . montelukast (SINGULAIR) 10 MG tablet TAKE 1 TABLET BY MOUTH EVERYDAY AT BEDTIME 90 tablet 1  . mycophenolate (CELLCEPT) 500 MG tablet Take 500 mg by mouth 2 (two) times daily.    . rosuvastatin (CRESTOR) 10 MG tablet Take 1 tablet (10 mg total) by mouth daily. 90 tablet 3  . triamcinolone (KENALOG) 0.1 % Apply 1 application topically daily as needed (lichen planus flare).    . triamcinolone ointment (KENALOG) 0.1 % Apply 1 application  topically daily as needed (lichen planus on feet).     No facility-administered medications prior to visit.     Review of Systems:   Constitutional:   No  weight loss, night sweats,  Fevers, chills, + fatigue, or  lassitude.  HEENT:   No headaches,  Difficulty swallowing,  Tooth/dental problems, or  Sore throat,                No sneezing, itching, ear ache, nasal congestion, post nasal drip,   CV:  No chest pain,  Orthopnea, PND, swelling in lower extremities, anasarca, dizziness, palpitations, syncope.   GI  No heartburn, indigestion, abdominal pain, nausea, vomiting, diarrhea, change in bowel habits, loss of appetite, bloody stools.   Resp:    No chest wall deformity  Skin: no rash or lesions.  GU:  no dysuria, change in color of urine, no urgency or frequency.  No flank pain, no hematuria   MS:  No joint pain or swelling.  No decreased range of motion.  No back pain.    Physical Exam  BP 110/80 (BP Location: Left Wrist, Patient Position: Sitting, Cuff Size: Normal)   Pulse 82   Temp 98 F (36.7 C) (Temporal)   Ht 6' (1.829 m)   Wt (!) 300 lb 12.8 oz (136.4 kg)   SpO2 96%   BMI 40.80 kg/m   GEN: A/Ox3; pleasant , NAD, well nourished    HEENT:  [place]/AT,   , NOSE-clear, THROAT-clear, no lesions, no postnasal drip or exudate noted.   NECK:  Supple w/ fair ROM; no JVD; normal carotid impulses w/o bruits; no thyromegaly or nodules palpated; no lymphadenopathy.    RESP  Clear  P & A; w/o, wheezes/ rales/ or rhonchi. no accessory muscle use, no dullness to percussion  CARD:  RRR, no m/r/g, tr peripheral edema, pulses intact, no cyanosis or clubbing.  GI:   Soft & nt; nml bowel sounds; no organomegaly or masses detected.   Musco: Warm bil, no deformities or joint swelling noted.   Neuro: alert, no focal deficits noted.    Skin: Warm, no lesions or rashes    Lab Results:   ProBNP No results found for: PROBNP  Imaging: CT Chest Wo Contrast  Result Date:  05/17/2020 CLINICAL DATA:  History of pneumonia with persistent cough. EXAM: CT CHEST WITHOUT CONTRAST TECHNIQUE: Multidetector CT imaging of the chest was performed following the standard protocol without IV contrast. COMPARISON:  Chest CT 01/22/2020 FINDINGS: Cardiovascular: The heart is normal in size. No pericardial effusion. Stable tortuosity and calcification of the thoracic aorta and stable 2 vessel coronary artery calcifications. Mediastinum/Nodes: Stable scattered mediastinal hilar lymph nodes but no mass or overt adenopathy. The esophagus is grossly normal. Lungs/Pleura: The left lower lobe inflammatory/infectious process has resolved. No persistent peribronchial thickening/inflammation in both lower lobes. Slight progression of peripheral interstitial process most pronounced at the lung bases with thickened inter septal lines, subpleural reticulation and hazy ground-glass opacity/alveolitis. Findings could be due to progressive interstitial lung disease. No acute infiltrates or worrisome pulmonary lesions. No worrisome pulmonary nodules. Follow-up high-resolution chest CT may be helpful for further evaluation possible interstitial lung disease. Upper Abdomen: No significant upper abdominal findings. Musculoskeletal: No chest wall mass, supraclavicular or axillary adenopathy. The bony thorax is intact.  No worrisome bone lesions. IMPRESSION: 1. Resolution of the left lower lobe inflammatory/infectious process. 2. Slight progression of peripheral interstitial process most pronounced at the lung bases with thickened interseptal lines, subpleural reticulation and hazy ground-glass opacity/alveolitis. Findings could be due to progressive interstitial lung disease. Follow-up high-resolution chest CT and 3-4 months may be helpful for further evaluation of possible interstitial lung disease. 3. No acute infiltrates or worrisome pulmonary lesions. 4. Stable atherosclerotic calcifications involving the thoracic  aorta and coronary arteries. 5. Emphysema and aortic atherosclerosis. Aortic Atherosclerosis (ICD10-I70.0) and Emphysema (ICD10-J43.9). Electronically Signed   By: Marijo Sanes M.D.   On: 05/17/2020 16:29      PFT Results Latest Ref Rng & Units 06/12/2019  FVC-Pre L 4.00  FVC-Predicted Pre % 84  FVC-Post L 4.07  FVC-Predicted Post % 86  Pre FEV1/FVC % % 76  Post FEV1/FCV % % 73  FEV1-Pre L 3.04  FEV1-Predicted Pre % 87  FEV1-Post L 2.98  DLCO uncorrected ml/min/mmHg 24.05  DLCO UNC% % 87  DLVA Predicted %  94  TLC L 6.46  TLC % Predicted % 86  RV % Predicted % 87    No results found for: NITRICOXIDE      Assessment & Plan:   Asthmatic bronchitis Recent slow to resolve asthmatic bronchitis now improved.  This was complicated by pneumonia, new onset A. Fib. Patient is returning back to baseline.  He is advised to use Symbicort as needed if asthma flares.  Albuterol as needed as well.  Asthma action plan discussed.  Continue on preventative/trigger prevention.  Will monitor closely as patient is now on amiodarone.  Patient did have some interstitial process noted on CT scan.  We will check high-resolution CT chest in 3 months along with full PFTs.  Of note February 2021 patient had normal lung function with no airflow obstruction or restriction.  And DLCO was normal at 87%.  Plan  Patient Instructions  Set up HRCT Chest in 3 months PFT in in 3 months .  Symbicort 160 2 puffs Twice daily- to use if Asthma flares  Albuterol inhaler As needed  Wheezing  Albuterol Nebs every 6hr as needed for wheezing .  Continue on Singulair 10mg  At bedtime .  Zyrtec 10mg  At bedtime As needed   Saline nasal spray Laretta Alstrom Twice daily  .  Flonase 2 puff daily As needed   Mucinex DM Twice daily  As needed  Cough/congestion  Pepcid 20mg  Twice daily.  Begin CPAP when  Available  Consider Dream wear nasal mask Wear CPAP all night long .  Work on healthy weight  Do not drive if sleepy .   Follow up with Dr. Valeta Harms in 3 months and As needed   Please contact office for sooner follow up if symptoms do not improve or worsen or seek emergency care       Persistent atrial fibrillation [facility]) Recent new onset A. fib during acute illness with pneumonia and asthmatic bronchitis.  Patient is following with cardiology.  Now on amiodarone.  Unfortunately failed cardioversion x2.  Continue follow-up with cardiology.  Maintain maintenance regimen with current medications.  Pneumonia Left lower lobe pneumonia.  Clinically improved after antibiotics. CT chest May 17, 2020 shows resolution of left lower lobe consolidation.  CT chest does show increased interstitial process along the lower lung bases.  We will follow-up high-resolution CT chest in 3 months.  OSA (obstructive sleep apnea) Newly diagnosed severe obstructive sleep apnea with desaturations.  Patient is to begin nocturnal CPAP.  Unfortunately this was ordered in November 2021.  Due to the backorder of CPAP machines he has not received his new CPAP as of today.  We will call the local DME for status on his delivery date. Patient education on sleep apnea and CPAP care.  Plan  Patient Instructions  Set up HRCT Chest in 3 months PFT in in 3 months .  Symbicort 160 2 puffs Twice daily- to use if Asthma flares  Albuterol inhaler As needed  Wheezing  Albuterol Nebs every 6hr as needed for wheezing .  Continue on Singulair 10mg  At bedtime .  Zyrtec 10mg  At bedtime As needed   Saline nasal spray Laretta Alstrom Twice daily  .  Flonase 2 puff daily As needed   Mucinex DM Twice daily  As needed  Cough/congestion  Pepcid 20mg  Twice daily.  Begin CPAP when  Available  Consider Dream wear nasal mask Wear CPAP all night long .  Work on healthy weight  Do not drive if sleepy .  Follow up with Dr.  Icard in 3 months and As needed   Please contact office for sooner follow up if symptoms do not improve or worsen or seek emergency care           Rexene Edison, NP 05/19/2020

## 2020-05-19 NOTE — Assessment & Plan Note (Signed)
Recent slow to resolve asthmatic bronchitis now improved.  This was complicated by pneumonia, new onset A. Fib. Patient is returning back to baseline.  He is advised to use Symbicort as needed if asthma flares.  Albuterol as needed as well.  Asthma action plan discussed.  Continue on preventative/trigger prevention.  Will monitor closely as patient is now on amiodarone.  Patient did have some interstitial process noted on CT scan.  We will check high-resolution CT chest in 3 months along with full PFTs.  Of note February 2021 patient had normal lung function with no airflow obstruction or restriction.  And DLCO was normal at 87%.  Plan  Patient Instructions  Set up HRCT Chest in 3 months PFT in in 3 months .  Symbicort 160 2 puffs Twice daily- to use if Asthma flares  Albuterol inhaler As needed  Wheezing  Albuterol Nebs every 6hr as needed for wheezing .  Continue on Singulair 10mg  At bedtime .  Zyrtec 10mg  At bedtime As needed   Saline nasal spray Laretta Alstrom Twice daily  .  Flonase 2 puff daily As needed   Mucinex DM Twice daily  As needed  Cough/congestion  Pepcid 20mg  Twice daily.  Begin CPAP when  Available  Consider Dream wear nasal mask Wear CPAP all night long .  Work on healthy weight  Do not drive if sleepy .  Follow up with Dr. Valeta Harms in 3 months and As needed   Please contact office for sooner follow up if symptoms do not improve or worsen or seek emergency care

## 2020-05-19 NOTE — Assessment & Plan Note (Signed)
Left lower lobe pneumonia.  Clinically improved after antibiotics. CT chest May 17, 2020 shows resolution of left lower lobe consolidation.  CT chest does show increased interstitial process along the lower lung bases.  We will follow-up high-resolution CT chest in 3 months.

## 2020-05-20 ENCOUNTER — Encounter: Payer: Self-pay | Admitting: Cardiology

## 2020-05-20 ENCOUNTER — Ambulatory Visit: Payer: Medicare HMO | Admitting: Cardiology

## 2020-05-20 VITALS — BP 114/72 | HR 85 | Ht 72.0 in | Wt 305.0 lb

## 2020-05-20 DIAGNOSIS — I4819 Other persistent atrial fibrillation: Secondary | ICD-10-CM | POA: Diagnosis not present

## 2020-05-20 DIAGNOSIS — I4891 Unspecified atrial fibrillation: Secondary | ICD-10-CM

## 2020-05-20 MED ORDER — AMIODARONE HCL 200 MG PO TABS: 200.0000 mg | ORAL_TABLET | Freq: Every day | ORAL | 3 refills | Status: DC

## 2020-05-20 NOTE — Progress Notes (Signed)
Electrophysiology Office Note:    Date:  05/20/2020   ID:  CAMILO MANDER, DOB 14-Apr-1946, MRN [id number]  PCP:  Lorene Dy, MD  [facility] Cardiologist:  Freada Bergeron, MD  [facility] Electrophysiologist:  Vickie Epley, MD   Referring MD: Lorene Dy, MD   Chief Complaint: Symptomatic persistent atrial fibrillation  History of Present Illness:    Todd Harrison is a 75 y.o. male who presents for an evaluation of symptomatic persistent atrial fibrillation at the request of Dr. Johney Frame. Their medical history includes lichen planus, thyroid disease.  He last saw Dr. Johney Frame April 28, 2020.  His diagnosis of atrial fibrillation dates back to September 2021 when he was diagnosed with left-sided pneumonia and asthma exacerbation.  He was treated with steroids and antibiotics.  When he saw a pulmonologist he was noted to be in new onset atrial fibrillation with RVR with ventricular rates in the 130s.  He was sent to the emergency department.  He was rate controlled with a diltiazem drip and started on anticoagulation.  After 3 weeks of anticoagulation he return for cardioversion on October 18.  After the shock was delivered, he converted to normal sinus rhythm but quickly went back into atrial fibrillation.  He saw Dr. Johney Frame March 11, 2020 when he was noted to be still in atrial fibrillation and very symptomatic.  He was loaded on amiodarone and a repeat cardioversion was performed on December 6.  This cardioversion was again complicated by early return to atrial fibrillation.  He was sent home and saw Dr. Johney Frame April 28, 2020.  At that appointment he was noted to be feeling much better.  He was back in normal sinus rhythm.   During my visit with the patient today, he tells me that he has minimal symptoms when he is in atrial fibrillation.  He sometimes can feel his heart beating irregularly but he does not have significant fatigue, dyspnea, chest pain,  presyncope, palpitations.  He is hopeful that with the improvement in his respiratory status he will have less atrial fibrillation.  Past Medical History:  Diagnosis Date  . Lichen planus   . Methotrexate, long term, current use   . Psoriasis   . Thyroid disease     Past Surgical History:  Procedure Laterality Date  . CARDIOVERSION N/A 02/23/2020   Procedure: CARDIOVERSION;  Surgeon: Werner Lean, MD;  Location: [facility];  Service: Cardiovascular;  Laterality: N/A;  . CARDIOVERSION N/A 04/12/2020   Procedure: CARDIOVERSION;  Surgeon: Buford Dresser, MD;  Location: [facility];  Service: Cardiovascular;  Laterality: N/A;  . CATARACT EXTRACTION Left    2019    Current Medications: No outpatient medications have been marked as taking for the 05/20/20 encounter (Appointment) with Vickie Epley, MD.     Allergies:   Gold and Mixed grasses   Social History   Socioeconomic History  . Marital status: Married    Spouse name: Not on file  . Number of children: Not on file  . Years of education: Not on file  . Highest education level: Not on file  Occupational History  . Not on file  Tobacco Use  . Smoking status: Former Smoker    Types: Cigarettes    Quit date: 1982    Years since quitting: 40.0  . Smokeless tobacco: Never Used  Vaping Use  . Vaping Use: Not on file  Substance and Sexual Activity  . Alcohol use: Yes    Alcohol/week: 1.0 standard  drink    Types: 1 Glasses of wine per week  . Drug use: No  . Sexual activity: Not on file  Other Topics Concern  . Not on file  Social History Narrative  . Not on file   Social Determinants of Health   Financial Resource Strain: Not on file  Food Insecurity: Not on file  Transportation Needs: Not on file  Physical Activity: Not on file  Stress: Not on file  Social Connections: Not on file     Family History: The patient's family history includes Diabetes in his mother; Stroke in his  mother.  ROS:   Please see the history of present illness.    All other systems reviewed and are negative.  EKGs/Labs/Other Studies Reviewed:    The following studies were reviewed today:  January 23, 2020 echo personally reviewed Left ventricular function normal, 55% Mild LVH Right ventricular function normal Mild to moderate MR Moderate calcification of the aortic valve Mild to moderate aortic sclerosis without stenosis Mild dilation of the ascending aorta, 42 mm   EKG:  The ekg ordered today demonstrates sinus rhythm with a ventricular rate of 85 bpm.  Recent Labs: 01/22/2020: B Natriuretic Peptide 184.6; Magnesium 2.1; TSH 1.171 03/18/2020: ALT 19 03/29/2020: Platelets 229 04/12/2020: BUN 15; Creatinine, Ser 1.00; Hemoglobin 13.3; Potassium 3.4; Sodium 139  Recent Lipid Panel No results found for: CHOL, TRIG, HDL, CHOLHDL, VLDL, LDLCALC, LDLDIRECT  Physical Exam:    VS:  There were no vitals taken for this visit.    Wt Readings from Last 3 Encounters:  05/19/20 (!) 300 lb 12.8 oz (136.4 kg)  04/28/20 (!) 302 lb 9.6 oz (137.3 kg)  04/27/20 300 lb (136.1 kg)     GEN:  Well nourished, well developed in no acute distress.  Obese HEENT: Normal NECK: No JVD; No carotid bruits LYMPHATICS: No lymphadenopathy CARDIAC: RRR, no murmurs, rubs, gallops RESPIRATORY:  Clear to auscultation without rales, wheezing or rhonchi  ABDOMEN: Soft, non-tender, non-distended MUSCULOSKELETAL:  No edema; No deformity  SKIN: Warm and dry NEUROLOGIC:  Alert and oriented x 3 PSYCHIATRIC:  Normal affect   ASSESSMENT:    1. Persistent atrial fibrillation ([place])   2. Atrial fibrillation with RVR ([place])   3. Morbid obesity ([place])    PLAN:    In order of problems listed above:  1. Persistent atrial fibrillation Patient with relatively short history of symptomatic persistent atrial fibrillation.  He has failed 2 cardioversions and is now maintaining sinus rhythm on amiodarone.  He was  referred to me to discuss ablation.  We discussed treatment options available for controlling his atrial fibrillation including continued amiodarone use versus ablation.  We discussed the risks, expected efficacy of each option in detail.    Currently, I do not think Mr. Hearl is a great candidate for ablation given his BMI.  There is a clear link between obesity and ablation outcomes.  We discussed this in detail during today's visit.  I would favor him continuing his amiodarone for now.  We will check a CMP, TSH and free T4 today.  I will plan on seeing him back in 3 months.  He will work on weight loss in the meantime.  2.  Morbid obesity Counseled on weight loss during today's visit at length.      Medication Adjustments/Labs and Tests Ordered: Current medicines are reviewed at length with the patient today.  Concerns regarding medicines are outlined above.  No orders of the defined types were placed  in this encounter.  No orders of the defined types were placed in this encounter.    Signed, Lars Mage, MD, [facility]  05/20/2020 7:56 AM    Electrophysiology [place]

## 2020-05-20 NOTE — Patient Instructions (Addendum)
Medication Instructions:  Your physician recommends that you continue on your current medications as directed. Please refer to the Current Medication list given to you today.  Labwork: You will get lab work today:  CMP, TSH and free T4  Testing/Procedures: None ordered.  Follow-Up: Your physician wants you to follow-up in: 3 months with Dr. Quentin Ore.     Any Other Special Instructions Will Be Listed Below (If Applicable).  If you need a refill on your cardiac medications before your next appointment, please call your pharmacy.

## 2020-05-21 LAB — COMPREHENSIVE METABOLIC PANEL
ALT: 11 IU/L (ref 0–44)
AST: 15 IU/L (ref 0–40)
Albumin/Globulin Ratio: 1.6 (ref 1.2–2.2)
Albumin: 4.1 g/dL (ref 3.7–4.7)
Alkaline Phosphatase: 77 IU/L (ref 44–121)
BUN/Creatinine Ratio: 13 (ref 10–24)
BUN: 15 mg/dL (ref 8–27)
Bilirubin Total: 0.4 mg/dL (ref 0.0–1.2)
CO2: 23 mmol/L (ref 20–29)
Calcium: 9.6 mg/dL (ref 8.6–10.2)
Chloride: 105 mmol/L (ref 96–106)
Creatinine, Ser: 1.14 mg/dL (ref 0.76–1.27)
GFR calc Af Amer: 74 mL/min/{1.73_m2} (ref >59)
GFR calc non Af Amer: 64 mL/min/{1.73_m2} (ref >59)
Globulin, Total: 2.5 g/dL (ref 1.5–4.5)
Glucose: 100 mg/dL — ABNORMAL HIGH (ref 65–99)
Potassium: 4.3 mmol/L (ref 3.5–5.2)
Sodium: 142 mmol/L (ref 134–144)
Total Protein: 6.6 g/dL (ref 6.0–8.5)

## 2020-05-21 LAB — TSH: TSH: 4.43 u[IU]/mL (ref 0.450–4.500)

## 2020-05-21 LAB — T4, FREE: Free T4: 1.33 ng/dL (ref 0.82–1.77)

## 2020-05-22 NOTE — Progress Notes (Signed)
PCCM: Thanks for seeing him Todd Nash, DO [facility] 05/22/2020 5:28 PM

## 2020-06-01 ENCOUNTER — Ambulatory Visit: Payer: Medicare HMO | Admitting: Sports Medicine

## 2020-06-01 ENCOUNTER — Other Ambulatory Visit: Payer: Self-pay

## 2020-06-01 VITALS — BP 115/71 | Ht 72.0 in | Wt 305.0 lb

## 2020-06-01 DIAGNOSIS — M75121 Complete rotator cuff tear or rupture of right shoulder, not specified as traumatic: Secondary | ICD-10-CM

## 2020-06-01 NOTE — Assessment & Plan Note (Signed)
Continued weakness but pain is well controlled and he is tolerating the exercises well. - Advised we try some PT but due to insurance coverage and cost he will let us know if he can do it. - In the meantime he was given additional light weight exercises to start. - Cont with voltaren gel as needed

## 2020-06-01 NOTE — Patient Instructions (Signed)
It was great to see you today! Thank you for letting me participate in your care!  Today, we discussed your continued right shoulder weakness and pain with certain movements. We need to continue to increase your strength so I am adding some exercises for you to start at home and want you to continue doing the exercises you were already doing. If you decided you can do formal physical therapy let us know and we will send in a prescription for you. You can go wherever you like.  Continue with the your hip exercises as they are making your stronger.  We will see you back in 8 weeks.  Be well, Harolyn Rutherford, DO PGY-4, Sports Medicine Fellow [place]

## 2020-06-01 NOTE — Progress Notes (Signed)
    SUBJECTIVE:   CHIEF COMPLAINT / HPI:   Right Shoulder Chronic Rotator Cuff Tear Todd Harrison is still having weakness and mild pain especially with abduction and flexion movements. He states pain wise it is tolerable and it does not prevent sleep or wake him up at night. He is doing the exercises and feels like they are helping but the lateral arm raises are difficult.  Left Hip Abductor Weakness He is tolerating the exercises well and making good progress. His pain is much improved and he states he feels stronger.  PERTINENT  PMH / PSH: lichen planus, a fib  OBJECTIVE:   BP 115/71   Ht 6' (1.829 m)   Wt (!) 305 lb (138.3 kg)   BMI 41.37 kg/m   [facility] 03/30/2020  Frequency of aerobic exercise (# of days/week) 0  Average time in minutes 0  Frequency of strengthening activities (# of days/week) 0   Shoulder, Right: No evidence of bony deformity, asymmetry, or muscle atrophy; No tenderness over long head of biceps (bicipital groove). Mild TTP over lateral edge of shoulder. No TTP at [facility] joint. Limited active ROM in external rotation. Improved passive range of motion. Thumb to T12 with significant tenderness. Strength 3/5 in flexion and abduction otherwise normal. Sensation intact. Peripheral pulses intact.  Special Tests: - Jobe test: Positive   - Hawkins: NEG   - Neer test: NEG   - Belly press test: Positive   - Drop arm test: Positive  Left Hip: Strength of left hip abductor is still decreased when compared to the right but improved since last visit, 4/5.   ASSESSMENT/PLAN:   Rotator cuff tear, right Continued weakness but pain is well controlled and he is tolerating the exercises well. - Advised we try some PT but due to insurance coverage and cost he will let us know if he can do it. - In the meantime he was given additional light weight exercises to start. - Cont with voltaren gel as needed     Nuala Alpha, DO PGY-4, Sports  Medicine Fellow [place]  I observed and examined the patient with the [facility] Fellow and agree with assessment and plan.  Note reviewed and modified by me. Tracey Harries, MD

## 2020-06-09 ENCOUNTER — Other Ambulatory Visit: Payer: Self-pay

## 2020-06-09 ENCOUNTER — Other Ambulatory Visit: Payer: Medicare HMO | Admitting: *Deleted

## 2020-06-09 DIAGNOSIS — E785 Hyperlipidemia, unspecified: Secondary | ICD-10-CM

## 2020-06-09 DIAGNOSIS — I4819 Other persistent atrial fibrillation: Secondary | ICD-10-CM

## 2020-06-09 DIAGNOSIS — D6869 Other thrombophilia: Secondary | ICD-10-CM

## 2020-06-09 LAB — LIPID PANEL
Chol/HDL Ratio: 3.7 ratio (ref 0.0–5.0)
Cholesterol, Total: 137 mg/dL (ref 100–199)
HDL: 37 mg/dL — ABNORMAL LOW (ref >39)
LDL Chol Calc (NIH): 75 mg/dL (ref 0–99)
Triglycerides: 143 mg/dL (ref 0–149)
VLDL Cholesterol Cal: 25 mg/dL (ref 5–40)

## 2020-06-09 LAB — HEPATIC FUNCTION PANEL
ALT: 13 IU/L (ref 0–44)
AST: 17 IU/L (ref 0–40)
Albumin: 4 g/dL (ref 3.7–4.7)
Alkaline Phosphatase: 68 IU/L (ref 44–121)
Bilirubin Total: 0.5 mg/dL (ref 0.0–1.2)
Bilirubin, Direct: 0.19 mg/dL (ref 0.00–0.40)
Total Protein: 6.2 g/dL (ref 6.0–8.5)

## 2020-07-09 DIAGNOSIS — G4733 Obstructive sleep apnea (adult) (pediatric): Secondary | ICD-10-CM

## 2020-07-09 NOTE — Telephone Encounter (Signed)
Tammy, Please see comment regarding CPAP and pressure and advise. Thank you.

## 2020-07-09 NOTE — Telephone Encounter (Signed)
Started here that you are having trouble with it but glad you finally got it we will decrease your CPAP pressure to CPAP AutoSet 5 to 15 cm H2O to see if that is better and more comfortable.  If you have trouble with this after a few days give me a call back and we can try to look at a download to see if we can find a more acceptable lower pressure

## 2020-08-03 ENCOUNTER — Other Ambulatory Visit: Payer: Self-pay

## 2020-08-03 ENCOUNTER — Ambulatory Visit: Payer: Medicare HMO | Admitting: Sports Medicine

## 2020-08-03 DIAGNOSIS — M75121 Complete rotator cuff tear or rupture of right shoulder, not specified as traumatic: Secondary | ICD-10-CM | POA: Diagnosis not present

## 2020-08-03 NOTE — Assessment & Plan Note (Signed)
Significant improvement on home exercise program  Keep up these exercises at least 3 times a week  If pain recurs he can return to see [place] but otherwise use the shoulder as tolerated

## 2020-08-03 NOTE — Progress Notes (Signed)
Chief complaint right shoulder pain and rotator cuff tear Secondary complaint left hip pain laterally  Patient evaluated on January 25 and found to have a complete and retracted rotator cuff tear on the right. He has been active playing tennis and weightlifting for years He also plays music He developed weakness and pain in the right shoulder and ultrasound revealed the significant tear With conservative home exercises for rotator cuff and topical Voltaren he is actually had an improvement in pain He feels his strength is improved significantly He can now reach over his head and behind his back  Problem #2 was lateral left hip pain On exam he was found to have significant abduction weakness He has done home hip abduction exercises about 3 times a week He feels that walking is more comfortable and has much less pain  Review of systems No groin pain No radicular pain from his neck  Physical exam Obese white male in no acute distress BP 120/70   Ht 6' (1.829 m)   Wt (!) 305 lb (138.3 kg)   BMI 41.37 kg/m  [place] Adult Exercise 03/30/2020  Frequency of aerobic exercise (# of days/week) 0  Average time in minutes 0  Frequency of strengthening activities (# of days/week) 0   Compared to prior exam Right shoulder shows a nearly complete range of motion Drop arm sign has resolved He now has good strength on resisted abduction internal and external rotation Biceps testing was normal Pushoff test is now normal While comparing this to the left shoulder shows still there is some mild weakness generally he has regained all of his deficits to 90% of normal  Left hip No pain to palpation Hip abduction strength is now returned to normal

## 2020-08-11 ENCOUNTER — Ambulatory Visit
Admission: RE | Admit: 2020-08-11 | Discharge: 2020-08-11 | Disposition: A | Discharge status: Home / Self Care | Payer: Medicare HMO | Source: Ambulatory Visit | Attending: Adult Health | Admitting: Adult Health

## 2020-08-11 DIAGNOSIS — J849 Interstitial pulmonary disease, unspecified: Secondary | ICD-10-CM

## 2020-08-13 NOTE — Progress Notes (Signed)
Called and spoke with patient, advised of results/recommendations per Rexene Edison NP.  He verbalized understanding.  Scheduled for covid test on 09/10/20 at 2:55 pm, PFT 11 am 09/15/20 to arrive by 10:45 am and f/u with Dr. Valeta Harms at 12 pm on 09/15/20, nothing further needed.

## 2020-08-19 ENCOUNTER — Ambulatory Visit: Payer: Medicare HMO | Admitting: Cardiology

## 2020-08-31 ENCOUNTER — Ambulatory Visit: Payer: Medicare HMO | Admitting: Cardiology

## 2020-09-10 ENCOUNTER — Other Ambulatory Visit (HOSPITAL_COMMUNITY): Payer: Medicare HMO

## 2020-09-15 ENCOUNTER — Ambulatory Visit: Payer: Medicare HMO | Admitting: Pulmonary Disease

## 2020-09-16 ENCOUNTER — Other Ambulatory Visit: Payer: Self-pay

## 2020-09-16 ENCOUNTER — Encounter: Payer: Self-pay | Admitting: Pulmonary Disease

## 2020-09-16 ENCOUNTER — Ambulatory Visit: Payer: Medicare HMO | Admitting: Pulmonary Disease

## 2020-09-16 VITALS — BP 126/84 | HR 67 | Temp 98.0°F | Ht 72.0 in | Wt 309.0 lb

## 2020-09-16 DIAGNOSIS — D849 Immunodeficiency, unspecified: Secondary | ICD-10-CM

## 2020-09-16 DIAGNOSIS — G4733 Obstructive sleep apnea (adult) (pediatric): Secondary | ICD-10-CM

## 2020-09-16 DIAGNOSIS — L439 Lichen planus, unspecified: Secondary | ICD-10-CM

## 2020-09-16 DIAGNOSIS — I4819 Other persistent atrial fibrillation: Secondary | ICD-10-CM

## 2020-09-16 DIAGNOSIS — J4530 Mild persistent asthma, uncomplicated: Secondary | ICD-10-CM | POA: Diagnosis not present

## 2020-09-16 NOTE — Progress Notes (Signed)
Synopsis: Referred in Dec 2020 for sinus infections by Lorene Dy, MD  Subjective:   PATIENT ID: Todd Harrison GENDER: male DOB: 11-Sep-1945, MRN: [id number]  Chief Complaint  Patient presents with  . Follow-up    Pt stated that his breathing has been good.  He has some congestion but he feels that this is from his GERD.     PMH of recurrent sinus infectious and SOB/Cough. [name] was was sick at this time. Symptoms started 6 weeks ago. Usually if his sinuses are inflammated he has tooth pain. He didn't have it this time. He has been coughing now for >6 weeks. 4 weeks ago now he was up at night time with cough. His cough is 99% better since last week. Family members were swabbed for covid and were negative. He has not been tested for covid. Former smoker quit 1982, quit in 10 years, but smoked 4-5 ppd at the time.  At this time his cough is much better.  He has no chest pain.  Patient denies hemoptysis or dark sputum production.  He states that once he had this appointment made his symptoms started to improve slowly.  He has not ever had any pulmonary function test in the past.  He does have some sleep troubles.  He wakes up with a hoarse voice.  He gets up several times in the middle of the night to pee.  He does snore per his wife.  He has never had a sleep study.  Occupation: Patient is a Engineer, technical sales, upright base, electric bass and trombone.  OV 09/16/2020: Patient last seen by me in 2020.  Here today for follow-up.  Has subsequently been seen by Patricia Nettle.  Last seen in the office in January 2022.  Not taking any of his inhalers at this time.  He said he felt like he was doing better so he stopped taking his inhaler medications.  He is still taking his immunosuppression on occasion not taking it regularly for the lichen planus.  He is taking his Accutane regularly.  He did have a home sleep study that was completed that showed severe OSA but he does not wear this routinely.  He  tries to wear it is much as he can at nighttime but tends to pull it off when he is asleep and he finds it in the floor or disconnected when he wakes up.  His reflux is also not controlled but he does try to titrate medications to help with his reflux.   Past Medical History:  Diagnosis Date  . Lichen planus   . Methotrexate, long term, current use   . Psoriasis   . Thyroid disease      Family History  Problem Relation Age of Onset  . Diabetes Mother   . Stroke Mother      Past Surgical History:  Procedure Laterality Date  . CARDIOVERSION N/A 02/23/2020   Procedure: CARDIOVERSION;  Surgeon: Werner Lean, MD;  Location: [facility] ENDOSCOPY;  Service: Cardiovascular;  Laterality: N/A;  . CARDIOVERSION N/A 04/12/2020   Procedure: CARDIOVERSION;  Surgeon: Buford Dresser, MD;  Location: [place];  Service: Cardiovascular;  Laterality: N/A;  . CATARACT EXTRACTION Left    2019    Social History   Socioeconomic History  . Marital status: Married    Spouse name: Not on file  . Number of children: Not on file  . Years of education: Not on file  . Highest education level: Not on file  Occupational  History  . Not on file  Tobacco Use  . Smoking status: Former Smoker    Types: Cigarettes    Quit date: 1982    Years since quitting: 40.3  . Smokeless tobacco: Never Used  Vaping Use  . Vaping Use: Not on file  Substance and Sexual Activity  . Alcohol use: Yes    Alcohol/week: 1.0 standard drink    Types: 1 Glasses of wine per week  . Drug use: No  . Sexual activity: Not on file  Other Topics Concern  . Not on file  Social History Narrative  . Not on file   Social Determinants of Health   Financial Resource Strain: Not on file  Food Insecurity: Not on file  Transportation Needs: Not on file  Physical Activity: Not on file  Stress: Not on file  Social Connections: Not on file  Intimate Partner Violence: Not on file     Allergies  Allergen Reactions  .  Gold Itching  . Mixed Grasses     unknown     Outpatient Medications Prior to Visit  Medication Sig Dispense Refill  . amiodarone (PACERONE) 200 MG tablet Take 1 tablet (200 mg total) by mouth daily. 90 tablet 3  . apixaban (ELIQUIS) 5 MG TABS tablet Take 1 tablet (5 mg total) by mouth 2 (two) times daily. 180 tablet 3  . B Complex Vitamins (VITAMIN B COMPLEX PO) Take 1 tablet by mouth every other day.     . clobetasol ointment (TEMOVATE) 0.05 % APPLY TO AFFECTED AREA TWICE A DAY    . famotidine (PEPCID) 20 MG tablet Take 20 mg by mouth 2 (two) times daily.    . ISOtretinoin (ACCUTANE) 40 MG capsule Take 40 mg by mouth 2 (two) times daily.     Marland Kitchen levothyroxine (SYNTHROID, LEVOTHROID) 100 MCG tablet Take 100 mcg by mouth daily before breakfast.     . loratadine (CLARITIN) 10 MG tablet Take 10 mg by mouth daily.     . montelukast (SINGULAIR) 10 MG tablet TAKE 1 TABLET BY MOUTH EVERYDAY AT BEDTIME 90 tablet 1  . mycophenolate (CELLCEPT) 500 MG tablet Take 500 mg by mouth 2 (two) times daily.    . rosuvastatin (CRESTOR) 10 MG tablet Take 1 tablet (10 mg total) by mouth daily. 90 tablet 3  . triamcinolone (KENALOG) 0.1 % Apply 1 application topically daily as needed (lichen planus flare).    . Turmeric (QC TUMERIC COMPLEX) 500 MG CAPS Take by mouth. Take as directed    . metoprolol succinate (TOPROL-XL) 50 MG 24 hr tablet Take 1 tablet (50 mg total) by mouth daily. Take with or immediately following a meal. 90 tablet 3  . acetaminophen (TYLENOL) 500 MG tablet Take 1,000 mg by mouth every 6 (six) hours as needed for moderate pain or headache. (Patient not taking: Reported on 09/16/2020)    . albuterol (VENTOLIN HFA) 108 (90 Base) MCG/ACT inhaler Inhale 2 puffs into the lungs every 6 (six) hours as needed for wheezing or shortness of breath. (Patient not taking: Reported on 09/16/2020) 18 g 3  . augmented betamethasone dipropionate (DIPROLENE-AF) 0.05 % ointment Apply 1 application topically in the  morning and at bedtime.  (Patient not taking: Reported on 09/16/2020)    . azelastine (ASTELIN) 0.1 % nasal spray Place 1 spray into both nostrils daily. Use in each nostril as directed (Patient not taking: Reported on 09/16/2020)    . budesonide-formoterol (SYMBICORT) 160-4.5 MCG/ACT inhaler Inhale 2 puffs into the lungs  2 (two) times daily. (Patient not taking: Reported on 09/16/2020) 1 each 6  . furosemide (LASIX) 20 MG tablet Take one tablet by mouth daily for 3 days and then take daily as needed for shortness of breath or swelling. (Patient not taking: Reported on 09/16/2020) 90 tablet 3  . Melatonin 5 MG CAPS Take 5-10 mg by mouth at bedtime. (Patient not taking: Reported on 09/16/2020)    . triamcinolone ointment (KENALOG) 0.1 % Apply 1 application topically daily as needed (lichen planus on feet). (Patient not taking: Reported on 09/16/2020)     No facility-administered medications prior to visit.    Review of Systems  Constitutional: Negative for chills, fever, malaise/fatigue and weight loss.  HENT: Negative for hearing loss, sore throat and tinnitus.   Eyes: Negative for blurred vision and double vision.  Respiratory: Positive for cough and shortness of breath. Negative for hemoptysis, sputum production, wheezing and stridor.   Cardiovascular: Positive for leg swelling. Negative for chest pain, palpitations, orthopnea and PND.  Gastrointestinal: Negative for abdominal pain, constipation, diarrhea, heartburn, nausea and vomiting.  Genitourinary: Negative for dysuria, hematuria and urgency.  Musculoskeletal: Negative for joint pain and myalgias.  Skin: Negative for itching and rash.  Neurological: Negative for dizziness, tingling, weakness and headaches.  Endo/Heme/Allergies: Negative for environmental allergies. Does not bruise/bleed easily.  Psychiatric/Behavioral: Negative for depression. The patient is not nervous/anxious and does not have insomnia.   All other systems reviewed and are  negative.    Objective:  Physical Exam Vitals reviewed.  Constitutional:      General: He is not in acute distress.    Appearance: He is well-developed. He is obese.  HENT:     Head: Normocephalic and atraumatic.  Eyes:     General: No scleral icterus.    Conjunctiva/sclera: Conjunctivae normal.     Pupils: Pupils are equal, round, and reactive to light.  Neck:     Vascular: No JVD.     Trachea: No tracheal deviation.  Cardiovascular:     Rate and Rhythm: Normal rate and regular rhythm.     Heart sounds: Normal heart sounds. No murmur heard.   Pulmonary:     Effort: Pulmonary effort is normal. No tachypnea, accessory muscle usage or respiratory distress.     Breath sounds: Normal breath sounds. No stridor. No wheezing, rhonchi or rales.  Abdominal:     General: Bowel sounds are normal. There is no distension.     Palpations: Abdomen is soft.     Tenderness: There is no abdominal tenderness.  Musculoskeletal:        General: No tenderness.     Cervical back: Neck supple.     Right lower leg: Edema present.     Left lower leg: Edema present.  Lymphadenopathy:     Cervical: No cervical adenopathy.  Skin:    General: Skin is warm and dry.     Capillary Refill: Capillary refill takes less than 2 seconds.     Findings: No rash.  Neurological:     Mental Status: He is alert and oriented to person, place, and time.  Psychiatric:        Behavior: Behavior normal.      Vitals:   09/16/20 1432  BP: 126/84  Pulse: 67  Temp: 98 F (36.7 C)  TempSrc: Tympanic  SpO2: 98%  Weight: (!) 309 lb (140.2 kg)  Height: 6' (1.829 m)   98% on RA BMI Readings from Last 3 Encounters:  09/16/20 41.91 kg/m  08/03/20 41.37 kg/m  06/01/20 41.37 kg/m   Wt Readings from Last 3 Encounters:  09/16/20 (!) 309 lb (140.2 kg)  08/03/20 (!) 305 lb (138.3 kg)  06/01/20 (!) 305 lb (138.3 kg)     CBC    Component Value Date/Time   WBC 6.4 03/29/2020 1148   RBC 4.20 (L) 03/29/2020  1148   HGB 13.3 04/12/2020 1034   HGB 12.6 (L) 02/20/2020 0858   HCT 39.0 04/12/2020 1034   HCT 36.6 (L) 02/20/2020 0858   PLT 229 03/29/2020 1148   PLT 279 02/20/2020 0858   MCV 92.4 03/29/2020 1148   MCV 86 02/20/2020 0858   MCH 29.5 03/29/2020 1148   MCHC 32.0 03/29/2020 1148   RDW 15.8 (H) 03/29/2020 1148   RDW 14.1 02/20/2020 0858   LYMPHSABS 1.7 02/16/2020 1155   LYMPHSABS 1.4 01/01/2020 1359   MONOABS 0.9 02/16/2020 1155   EOSABS 0.1 02/16/2020 1155   EOSABS 0.1 01/01/2020 1359   BASOSABS 0.1 02/16/2020 1155   BASOSABS 0.1 01/01/2020 1359    Chest Imaging: CXR - 2018 - normal   CT Chest 08/11/2020:  CT imaging was complete in April this revealed basilar subpleural reticulations and groundglass concern for possible ILD.  He also has some small airway disease.  I think after reviewing the information and his clinical picture he appears to be volume overloaded and this areas of groundglass and subpleural involvement likely are related to his positive fluid balance and possibly pulmonary edema. The patient's images have been independently reviewed by me.     Pulmonary Functions Testing Results: PFT Results Latest Ref Rng & Units 06/12/2019  FVC-Pre L 4.00  FVC-Predicted Pre % 84  FVC-Post L 4.07  FVC-Predicted Post % 86  Pre FEV1/FVC % % 76  Post FEV1/FCV % % 73  FEV1-Pre L 3.04  FEV1-Predicted Pre % 87  FEV1-Post L 2.98  DLCO uncorrected ml/min/mmHg 24.05  DLCO UNC% % 87  DLVA Predicted % 94  TLC L 6.46  TLC % Predicted % 86  RV % Predicted % 87    FeNO: None   Pathology: None   Echocardiogram:   IMPRESSIONS  1. Left ventricular ejection fraction, by estimation, is 55 to 60%. The  left ventricle has normal function. The left ventricle has no regional  wall motion abnormalities. There is mild concentric left ventricular  hypertrophy. Left ventricular diastolic  function could not be evaluated.  2. Right ventricular systolic function is normal. The right  ventricular  size is normal. Tricuspid regurgitation signal is inadequate for assessing  PA pressure.  3. The mitral valve is grossly normal. Mild to moderate mitral valve  regurgitation. No evidence of mitral stenosis.  4. The aortic valve is tricuspid. There is moderate calcification of the  aortic valve. There is moderate thickening of the aortic valve. Aortic  valve regurgitation is not visualized. Mild to moderate aortic valve  sclerosis/calcification is present,  without any evidence of aortic stenosis.  5. Aortic dilatation noted. There is mild dilatation of the aortic root,  measuring 42 mm.  6. The inferior vena cava is dilated in size with >50% respiratory  variability, suggesting right atrial pressure of 8 mmHg.   Heart Catheterization: None      Assessment & Plan:     ICD-10-CM   1. OSA (obstructive sleep apnea)  G47.33   2. Mild persistent asthmatic bronchitis without complication  T06.26   3. Persistent atrial fibrillation (HCC)  I48.19   4. Morbid obesity  due to excess calories (HCC)  E66.01   5. Lichen planus  E03.5   6. Immunocompromised state ([place])  D84.9     Discussion:  This is a 75 year old gentleman initially seen in evaluation for cough.  This is better than the first time I met him.  Longstanding history of smoking.  He is morbidly obese has severe obstructive sleep apnea multiple medical comorbidities that really leave him short of breath.  His weight plays a huge factor in his medical problems.  He also has compliance issues with medication regimens.  He seems to start and stop medications based on preference.  So it is hard to tell whether or not something is making any difference in his symptomatology.  Plan: He needs to follow-up in her sleep clinic regarding his OSA.  He follows with Dr. Halford Chessman. From his asthma standpoint he can continue the use of his Symbicort as needed. He needs to lose weight.  We discussed importance of exercise and dietary  intake. He says that he has fluid pills that he supposed to take at home to help with his lower extremity edema and I recommend him restart these.  He can see [place] as needed or in 6 months.   Current Outpatient Medications:  .  amiodarone (PACERONE) 200 MG tablet, Take 1 tablet (200 mg total) by mouth daily., Disp: 90 tablet, Rfl: 3 .  apixaban (ELIQUIS) 5 MG TABS tablet, Take 1 tablet (5 mg total) by mouth 2 (two) times daily., Disp: 180 tablet, Rfl: 3 .  B Complex Vitamins (VITAMIN B COMPLEX PO), Take 1 tablet by mouth every other day. , Disp: , Rfl:  .  clobetasol ointment (TEMOVATE) 0.05 %, APPLY TO AFFECTED AREA TWICE A DAY, Disp: , Rfl:  .  famotidine (PEPCID) 20 MG tablet, Take 20 mg by mouth 2 (two) times daily., Disp: , Rfl:  .  ISOtretinoin (ACCUTANE) 40 MG capsule, Take 40 mg by mouth 2 (two) times daily. , Disp: , Rfl:  .  levothyroxine (SYNTHROID, LEVOTHROID) 100 MCG tablet, Take 100 mcg by mouth daily before breakfast. , Disp: , Rfl:  .  loratadine (CLARITIN) 10 MG tablet, Take 10 mg by mouth daily. , Disp: , Rfl:  .  montelukast (SINGULAIR) 10 MG tablet, TAKE 1 TABLET BY MOUTH EVERYDAY AT BEDTIME, Disp: 90 tablet, Rfl: 1 .  mycophenolate (CELLCEPT) 500 MG tablet, Take 500 mg by mouth 2 (two) times daily., Disp: , Rfl:  .  rosuvastatin (CRESTOR) 10 MG tablet, Take 1 tablet (10 mg total) by mouth daily., Disp: 90 tablet, Rfl: 3 .  triamcinolone (KENALOG) 0.1 %, Apply 1 application topically daily as needed (lichen planus flare)., Disp: , Rfl:  .  Turmeric (QC TUMERIC COMPLEX) 500 MG CAPS, Take by mouth. Take as directed, Disp: , Rfl:  .  metoprolol succinate (TOPROL-XL) 50 MG 24 hr tablet, Take 1 tablet (50 mg total) by mouth daily. Take with or immediately following a meal., Disp: 90 tablet, Rfl: 3   Garner Nash, DO [facility] 09/16/2020 2:52 PM

## 2020-09-16 NOTE — Patient Instructions (Signed)
Thank you for visiting Dr. Valeta Harms at [facility]. Today we recommend the following:  Follow up with cardiology Take your fluid pills  If it gets worse let us know   Return in about 6 months (around 03/19/2021) for with APP or Dr. Valeta Harms.    Please do your part to reduce the spread of COVID-19.

## 2020-10-21 NOTE — Progress Notes (Signed)
Electrophysiology Office Follow up Visit Note:    Date:  10/22/2020   ID:  Todd Harrison, DOB 10/16/1945, MRN [id number]  PCP:  Lorene Dy, MD  [facility] Cardiologist:  Freada Bergeron, MD  [facility] Electrophysiologist:  Vickie Epley, MD    Interval History:    Todd Harrison is a 75 y.o. male who presents for a follow up visit for his symptomatic persistent atrial fibrillation.  I last saw the patient May 20, 2020.  The patient was on amiodarone at the time of our last appointment and we elected to continue that therapy.  He is not a good candidate for ablation given his BMI.  He is doing well today.  Tolerating the amiodarone well.  He has not had episodes of atrial fibrillation since being on this medication.   Past Medical History:  Diagnosis Date   Lichen planus    Methotrexate, long term, current use    Psoriasis    Thyroid disease     Past Surgical History:  Procedure Laterality Date   CARDIOVERSION N/A 02/23/2020   Procedure: CARDIOVERSION;  Surgeon: Werner Lean, MD;  Location: [place] ENDOSCOPY;  Service: Cardiovascular;  Laterality: N/A;   CARDIOVERSION N/A 04/12/2020   Procedure: CARDIOVERSION;  Surgeon: Buford Dresser, MD;  Location: [facility] ENDOSCOPY;  Service: Cardiovascular;  Laterality: N/A;   CATARACT EXTRACTION Left    2019    Current Medications: Current Meds  Medication Sig   amiodarone (PACERONE) 200 MG tablet Take 1 tablet (200 mg total) by mouth daily.   apixaban (ELIQUIS) 5 MG TABS tablet Take 1 tablet (5 mg total) by mouth 2 (two) times daily.   B Complex Vitamins (VITAMIN B COMPLEX PO) Take 1 tablet by mouth every other day.    clobetasol ointment (TEMOVATE) 0.05 % APPLY TO AFFECTED AREA TWICE A DAY   famotidine (PEPCID) 20 MG tablet Take 20 mg by mouth 2 (two) times daily.   ISOtretinoin (ACCUTANE) 40 MG capsule Take 40 mg by mouth 2 (two) times daily.    levothyroxine (SYNTHROID, LEVOTHROID) 100 MCG  tablet Take 100 mcg by mouth daily before breakfast.    loratadine (CLARITIN) 10 MG tablet Take 10 mg by mouth daily.    metoprolol succinate (TOPROL-XL) 50 MG 24 hr tablet Take 1 tablet (50 mg total) by mouth daily. Take with or immediately following a meal.   montelukast (SINGULAIR) 10 MG tablet TAKE 1 TABLET BY MOUTH EVERYDAY AT BEDTIME   mycophenolate (CELLCEPT) 500 MG tablet Take 500 mg by mouth 2 (two) times daily.   rosuvastatin (CRESTOR) 10 MG tablet Take 1 tablet (10 mg total) by mouth daily.   triamcinolone (KENALOG) 0.1 % Apply 1 application topically daily as needed (lichen planus flare).   Turmeric 500 MG CAPS Take by mouth. Take as directed     Allergies:   Gold and Mixed grasses   Social History   Socioeconomic History   Marital status: Married    Spouse name: Not on file   Number of children: Not on file   Years of education: Not on file   Highest education level: Not on file  Occupational History   Not on file  Tobacco Use   Smoking status: Former    Pack years: 0.00    Types: Cigarettes    Quit date: 1982    Years since quitting: 40.4   Smokeless tobacco: Never  Vaping Use   Vaping Use: Not on file  Substance and Sexual Activity  Alcohol use: Yes    Alcohol/week: 1.0 standard drink    Types: 1 Glasses of wine per week   Drug use: No   Sexual activity: Not on file  Other Topics Concern   Not on file  Social History Narrative   Not on file   Social Determinants of Health   Financial Resource Strain: Not on file  Food Insecurity: Not on file  Transportation Needs: Not on file  Physical Activity: Not on file  Stress: Not on file  Social Connections: Not on file     Family History: The patient's family history includes Diabetes in his mother; Stroke in his mother.  ROS:   Please see the history of present illness.    All other systems reviewed and are negative.  EKGs/Labs/Other Studies Reviewed:    The following studies were reviewed  today:   EKG:  The ekg ordered today demonstrates normal sinus rhythm with a ventricular rate of 64 bpm.  Recent Labs: 01/22/2020: B Natriuretic Peptide 184.6; Magnesium 2.1 03/29/2020: Platelets 229 04/12/2020: Hemoglobin 13.3 05/20/2020: BUN 15; Creatinine, Ser 1.14; Potassium 4.3; Sodium 142; TSH 4.430 06/09/2020: ALT 13  Recent Lipid Panel    Component Value Date/Time   CHOL 137 06/09/2020 0929   TRIG 143 06/09/2020 0929   HDL 37 (L) 06/09/2020 0929   CHOLHDL 3.7 06/09/2020 0929   LDLCALC 75 06/09/2020 0929    Physical Exam:    VS:  Ht 6' (1.829 m)   Wt (!) 307 lb (139.3 kg)   BMI 41.64 kg/m     Wt Readings from Last 3 Encounters:  10/22/20 (!) 307 lb (139.3 kg)  09/16/20 (!) 309 lb (140.2 kg)  08/03/20 (!) 305 lb (138.3 kg)     GEN:  Well nourished, well developed in no acute distress.  Morbidly obese HEENT: Normal NECK: No JVD; No carotid bruits LYMPHATICS: No lymphadenopathy CARDIAC: RRR, no murmurs, rubs, gallops RESPIRATORY:  Clear to auscultation without rales, wheezing or rhonchi  ABDOMEN: Soft, non-tender, non-distended MUSCULOSKELETAL:  No edema; No deformity  SKIN: Warm and dry NEUROLOGIC:  Alert and oriented x 3 PSYCHIATRIC:  Normal affect   ASSESSMENT:    1. Persistent atrial fibrillation ([place])   2. Morbid obesity ([place])   3. Encounter for long-term current use of high risk medication    PLAN:    In order of problems listed above:  Persistent atrial fibrillation On Eliquis for stroke prophylaxis. On amiodarone for rhythm control. We will check a TSH, free T4 and complete metabolic panel today.  2.  Morbid obesity His weight precludes safe ablation.  3.  High risk medication monitoring Amiodarone labs as above.  Follow-up 6 months.    Medication Adjustments/Labs and Tests Ordered: Current medicines are reviewed at length with the patient today.  Concerns regarding medicines are outlined above.  No orders of the defined types were  placed in this encounter.  No orders of the defined types were placed in this encounter.    Signed, Lars Mage, MD, [facility], [facility] 10/22/2020 8:29 AM    Electrophysiology  [facility]

## 2020-10-22 ENCOUNTER — Ambulatory Visit: Payer: Medicare HMO | Admitting: Cardiology

## 2020-10-22 ENCOUNTER — Other Ambulatory Visit: Payer: Self-pay

## 2020-10-22 VITALS — BP 116/72 | HR 64 | Ht 72.0 in | Wt 307.0 lb

## 2020-10-22 DIAGNOSIS — I4819 Other persistent atrial fibrillation: Secondary | ICD-10-CM

## 2020-10-22 DIAGNOSIS — Z79899 Other long term (current) drug therapy: Secondary | ICD-10-CM | POA: Diagnosis not present

## 2020-10-22 LAB — COMPREHENSIVE METABOLIC PANEL
ALT: 14 IU/L (ref 0–44)
AST: 18 IU/L (ref 0–40)
Albumin/Globulin Ratio: 2.5 — ABNORMAL HIGH (ref 1.2–2.2)
Albumin: 4.3 g/dL (ref 3.7–4.7)
Alkaline Phosphatase: 73 IU/L (ref 44–121)
BUN/Creatinine Ratio: 13 (ref 10–24)
BUN: 16 mg/dL (ref 8–27)
Bilirubin Total: 0.5 mg/dL (ref 0.0–1.2)
CO2: 21 mmol/L (ref 20–29)
Calcium: 9.3 mg/dL (ref 8.6–10.2)
Chloride: 102 mmol/L (ref 96–106)
Creatinine, Ser: 1.21 mg/dL (ref 0.76–1.27)
Globulin, Total: 1.7 g/dL (ref 1.5–4.5)
Glucose: 100 mg/dL — ABNORMAL HIGH (ref 65–99)
Potassium: 3.9 mmol/L (ref 3.5–5.2)
Sodium: 138 mmol/L (ref 134–144)
Total Protein: 6 g/dL (ref 6.0–8.5)
eGFR: 64 mL/min/{1.73_m2} (ref >59)

## 2020-10-22 LAB — TSH: TSH: 5.17 u[IU]/mL — ABNORMAL HIGH (ref 0.450–4.500)

## 2020-10-22 LAB — T4, FREE: Free T4: 1.26 ng/dL (ref 0.82–1.77)

## 2020-11-01 DIAGNOSIS — I4819 Other persistent atrial fibrillation: Secondary | ICD-10-CM

## 2020-11-01 DIAGNOSIS — Z79899 Other long term (current) drug therapy: Secondary | ICD-10-CM

## 2020-11-25 ENCOUNTER — Telehealth (INDEPENDENT_AMBULATORY_CARE_PROVIDER_SITE_OTHER): Payer: Medicare HMO | Admitting: Adult Health

## 2020-11-25 ENCOUNTER — Encounter: Payer: Self-pay | Admitting: Adult Health

## 2020-11-25 DIAGNOSIS — J4521 Mild intermittent asthma with (acute) exacerbation: Secondary | ICD-10-CM | POA: Diagnosis not present

## 2020-11-25 DIAGNOSIS — U071 COVID-19: Secondary | ICD-10-CM | POA: Insufficient documentation

## 2020-11-25 MED ORDER — AMOXICILLIN-POT CLAVULANATE 875-125 MG PO TABS: 1.0000 | ORAL_TABLET | Freq: Two times a day (BID) | ORAL | 0 refills | Status: AC

## 2020-11-25 MED ORDER — PREDNISONE 20 MG PO TABS: 20.0000 mg | ORAL_TABLET | Freq: Every day | ORAL | 0 refills | Status: DC

## 2020-11-25 NOTE — Assessment & Plan Note (Signed)
Covid 19 with symptom onset 11/18/20 , out of window for antiviral pack .  Symptoms remain mild in nature with secondary asthmatic bronchitis  Vaccinated x 3 .  Follow up in 2 weeks   Plan Patient Instructions  Augmentin 875mg  Twice daily  for 1 week -take with food .  Mucinex DM Twice daily  As needed  cough/congestion.  Prednisone 20mg  daily for 5 days .  Albuterol inhaler As needed  Wheezing  Albuterol Nebs every 6hr as needed for wheezing .  Continue on Singulair 10mg  At bedtime .  Zyrtec 10mg  At bedtime As needed   Saline nasal spray Laretta Alstrom Twice daily  .  Flonase 2 puff daily As needed   Mucinex DM Twice daily  As needed  Cough/congestion  Follow up with Dr. Valeta Harms or Shatona Andujar NP in 2-3 weeks and As needed   Please contact office for sooner follow up if symptoms do not improve or worsen or seek emergency care

## 2020-11-25 NOTE — Telephone Encounter (Signed)
Called patient. He agreed to the video visit at 230pm. I went over the MyChart video instructions with him. He verbalized understanding.   Nothing further needed at time of call.

## 2020-11-25 NOTE — Assessment & Plan Note (Signed)
Flare secondary to COVID 19   Plan  Patient Instructions  Augmentin 875mg  Twice daily  for 1 week -take with food .  Mucinex DM Twice daily  As needed  cough/congestion.  Prednisone 20mg  daily for 5 days .  Albuterol inhaler As needed  Wheezing  Albuterol Nebs every 6hr as needed for wheezing .  Continue on Singulair 10mg  At bedtime .  Zyrtec 10mg  At bedtime As needed   Saline nasal spray Todd Harrison Twice daily  .  Flonase 2 puff daily As needed   Mucinex DM Twice daily  As needed  Cough/congestion  Follow up with Dr. Valeta Harms or [place] Welliver NP in 2-3 weeks and As needed   Please contact office for sooner follow up if symptoms do not improve or worsen or seek emergency care

## 2020-11-25 NOTE — Progress Notes (Signed)
Virtual Visit via Video Note  I connected with Todd Harrison on 11/25/20 at  2:30 PM EDT by a video enabled telemedicine application and verified that I am speaking with the correct person using two identifiers.  Location: Patient: Home  Provider: Office    I discussed the limitations of evaluation and management by telemedicine and the availability of in person appointments. The patient expressed understanding and agreed to proceed.  History of Present Illness: 75 yo male former smoker seen for pulmonary consult 04/2019 for Asthma and Cough.  Medical history significant for lichen planus currently on Cellcept (previously on Methrotrexate)  Engineer, technical sales , works in Scientist, clinical (histocompatibility and immunogenetics) .   Today's video visit for an acute office visit. Complains on cough, congestion, wheezing, runny nose, sore throat that began 7/14. Had direct exposure from family member. Tested positive today on home test .  Was on vacation last week, went to [place]. Has been vaccinated for Covid 19 x 3.  Does not feel terrible but cough will not stop. Now wheezing feels his asthma is acting up .  No fever or hemoptysis. No calf pain or swelling .  Appetite is good.  Has albuterol but only used it once last night .  Previously on Symbicort but has not needed doing well until got sick this past week.   Past Medical History:  Diagnosis Date   Lichen planus    Methotrexate, long term, current use    Psoriasis    Thyroid disease    Current Outpatient Medications on File Prior to Visit  Medication Sig Dispense Refill   amiodarone (PACERONE) 200 MG tablet Take 1 tablet (200 mg total) by mouth daily. 90 tablet 3   apixaban (ELIQUIS) 5 MG TABS tablet Take 1 tablet (5 mg total) by mouth 2 (two) times daily. 180 tablet 3   B Complex Vitamins (VITAMIN B COMPLEX PO) Take 1 tablet by mouth every other day.      clobetasol ointment (TEMOVATE) 0.05 % APPLY TO AFFECTED AREA TWICE A DAY     famotidine (PEPCID) 20 MG tablet  Take 20 mg by mouth 2 (two) times daily.     ISOtretinoin (ACCUTANE) 40 MG capsule Take 40 mg by mouth 2 (two) times daily.      levothyroxine (SYNTHROID, LEVOTHROID) 100 MCG tablet Take 100 mcg by mouth daily before breakfast.      loratadine (CLARITIN) 10 MG tablet Take 10 mg by mouth daily.      montelukast (SINGULAIR) 10 MG tablet TAKE 1 TABLET BY MOUTH EVERYDAY AT BEDTIME 90 tablet 1   mycophenolate (CELLCEPT) 500 MG tablet Take 500 mg by mouth 2 (two) times daily.     triamcinolone (KENALOG) 0.1 % Apply 1 application topically daily as needed (lichen planus flare).     Turmeric 500 MG CAPS Take by mouth. Take as directed     metoprolol succinate (TOPROL-XL) 50 MG 24 hr tablet Take 1 tablet (50 mg total) by mouth daily. Take with or immediately following a meal. 90 tablet 3   rosuvastatin (CRESTOR) 10 MG tablet Take 1 tablet (10 mg total) by mouth daily. 90 tablet 3   No current facility-administered medications on file prior to visit.       Observations/Objective: Appears in NAD, speaks in full sentences, does not appear toxic in video visit   Hx :   June 12, 2019 that showed normal lung function with FEV1 at 87%, ratio 76, FVC 84%.  No significant bronchodilator response.  DLCO  was 87%.     Chest x-ray done October 17, 2019 showed no acute process.    CT sinus December 06, 2019 paranasal sinuses well aerated.  No evidence of sinusitis.  Nasal septal deviation leftward   Sputum culture October 24, 2019 growth of normal oral flora.   2D echo January 23, 2020 showed EF 55 to 37%, RV systolic function is normal.  RV size is normal.  Moderate calcification of the aortic valve and thickening.  Mild to moderate aortic valve sclerosis.  No evidence of aortic stenosis.   Assessment and Plan: Covid 19 infection out of window for antiviral   Acute Asthmatic Bronchitis   Plan  Patient Instructions  Augmentin 875mg  Twice daily  for 1 week -take with food .  Mucinex DM Twice daily  As  needed  cough/congestion.  Prednisone 20mg  daily for 5 days .  Albuterol inhaler As needed  Wheezing  Albuterol Nebs every 6hr as needed for wheezing .  Continue on Singulair 10mg  At bedtime .  Zyrtec 10mg  At bedtime As needed   Saline nasal spray Laretta Alstrom Twice daily  .  Flonase 2 puff daily As needed   Mucinex DM Twice daily  As needed  Cough/congestion  Follow up with Dr. Valeta Harms or Loreto Loescher NP in 2-3 weeks and As needed   Please contact office for sooner follow up if symptoms do not improve or worsen or seek emergency care     Follow Up Instructions:    I discussed the assessment and treatment plan with the patient. The patient was provided an opportunity to ask questions and all were answered. The patient agreed with the plan and demonstrated an understanding of the instructions.   The patient was advised to call back or seek an in-person evaluation if the symptoms worsen or if the condition fails to improve as anticipated.  I provided 22  minutes of non-face-to-face time during this encounter.   Rexene Edison, NP

## 2020-11-25 NOTE — Patient Instructions (Addendum)
Augmentin 875mg  Twice daily  for 1 week -take with food .  Mucinex DM Twice daily  As needed  cough/congestion.  Prednisone 20mg  daily for 5 days .  Albuterol inhaler As needed  Wheezing  Albuterol Nebs every 6hr as needed for wheezing .  Continue on Singulair 10mg  At bedtime .  Zyrtec 10mg  At bedtime As needed   Saline nasal spray Laretta Alstrom Twice daily  .  Flonase 2 puff daily As needed   Mucinex DM Twice daily  As needed  Cough/congestion  Follow up with Dr. Valeta Harms or Edona Schreffler NP in 2-3 weeks and As needed   Please contact office for sooner follow up if symptoms do not improve or worsen or seek emergency care

## 2020-11-25 NOTE — Telephone Encounter (Signed)
Set up video visit with me this evening , can double book ([phone] )

## 2020-11-25 NOTE — Telephone Encounter (Signed)
Received the following message from patient:   "Apparently I contracted covid while on vacation last week (11/15/20). My only symptoms where nasal congestion and cough.  I had no headache, chills, or fever. I tested positive this morning so I'm hesitant to come in. I tested positive this morning (11/25/20).     I have lingering wheezing, productive cough, and extreme sinus drainage.  I can't seem to stop coughing. Have a hard time sleeping because of wheezing and cough.  Please advise if possible. Thanks,   Todd Harrison [phone]"  TP, can you please advise? Thanks.

## 2020-11-28 MED ORDER — ALBUTEROL SULFATE HFA 108 (90 BASE) MCG/ACT IN AERS: 2.0000 | INHALATION_SPRAY | Freq: Four times a day (QID) | RESPIRATORY_TRACT | 6 refills | Status: DC | PRN

## 2020-12-07 ENCOUNTER — Telehealth: Payer: Self-pay

## 2020-12-07 NOTE — Telephone Encounter (Signed)
error 

## 2020-12-14 ENCOUNTER — Other Ambulatory Visit: Payer: Self-pay

## 2020-12-14 ENCOUNTER — Encounter: Payer: Self-pay | Admitting: Adult Health

## 2020-12-14 ENCOUNTER — Ambulatory Visit: Payer: Medicare HMO | Admitting: Adult Health

## 2020-12-14 ENCOUNTER — Ambulatory Visit (INDEPENDENT_AMBULATORY_CARE_PROVIDER_SITE_OTHER): Payer: Medicare HMO

## 2020-12-14 VITALS — BP 116/70 | HR 71 | Temp 98.0°F | Ht 72.0 in | Wt 305.8 lb

## 2020-12-14 DIAGNOSIS — J4521 Mild intermittent asthma with (acute) exacerbation: Secondary | ICD-10-CM

## 2020-12-14 DIAGNOSIS — U071 COVID-19: Secondary | ICD-10-CM | POA: Diagnosis not present

## 2020-12-14 DIAGNOSIS — G4733 Obstructive sleep apnea (adult) (pediatric): Secondary | ICD-10-CM | POA: Diagnosis not present

## 2020-12-14 DIAGNOSIS — R9389 Abnormal findings on diagnostic imaging of other specified body structures: Secondary | ICD-10-CM

## 2020-12-14 NOTE — Progress Notes (Signed)
$'@Patient'F$  ID: Todd Harrison, male    DOB: March 09, 1946, 75 y.o.   MRN: [id number]    Referring provider: Lorene Dy, MD  HPI: 75 year old male former smoker seen for pulmonary consult December 2020 for asthma and cough and obstructive sleep apnea Medical history significant for lichen planus currently on CellCept (previously on methotrexate),.  A. Fib on amiodarone Dysphagia (2021 modified barium swallow showed mild dysphagia with aspiration risk) Engineer, technical sales, works in Engineer, production part-time.  TEST/EVENTS :  June 12, 2019 that showed normal lung function with FEV1 at 87%, ratio 76, FVC 84%.  No significant bronchodilator response.  DLCO was 87%.     Chest x-ray done October 17, 2019 showed no acute process.    CT sinus December 06, 2019 paranasal sinuses well aerated.  No evidence of sinusitis.  Nasal septal deviation leftward   Sputum culture October 24, 2019 growth of normal oral flora.   2D echo January 23, 2020 showed EF 55 to 123456, RV systolic function is normal.  RV size is normal.  Moderate calcification of the aortic valve and thickening.  Mild to moderate aortic valve sclerosis.  No evidence of aortic stenosis.  Home sleep study November 2021-severe obstructive sleep apnea with moderate desaturations  High-resolution CT chest August 11, 2020 peripheral basilar predominant pattern of subpleural reticulation groundglass may be due to nonspecific interstitial pneumonitis-similar to September 2021., mild emphysema, subtle hypodense lesion on the liver measuring 2.1 cm.  And stable a sending aortic aneurysm.   12/14/2020 Follow up : Asthma, Cough and OSA  Patient presents for a 3-week follow-up.  Patient was seen via video visit last month for COVID-19 infection.  Patient developed acute respiratory symptoms with cough congestion and sore throat July 14 while on vacation to [place]..  He tested positive for COVID on November 25, 2020.  He had been vaccinated for COVID-19 x3.   He was treated for an asthmatic bronchitic exacerbation with Augmentin and prednisone.  Patient returns today and says he is better still continues to have some ongoing cough.  He did stop his CPAP during this illness because he says he was coughing too much to wear.  Patient denies any hemoptysis, chest pain, orthopnea or increased edema.  He has a history of A. fib on amiodarone and Eliquis.   Allergies  Allergen Reactions   Gold Itching   Mixed Grasses     unknown    Immunization History  Administered Date(s) Administered   Fluad Quad(high Dose 65+) 03/04/2019, 01/25/2020   Influenza-Unspecified 03/08/2016   PFIZER(Purple Top)SARS-COV-2 Vaccination 06/13/2019, 07/04/2019, 04/07/2020   Pneumococcal Polysaccharide-23 01/25/2020    Past Medical History:  Diagnosis Date   Lichen planus    Methotrexate, long term, current use    Psoriasis    Thyroid disease     Tobacco History: Social History   Tobacco Use  Smoking Status Former   Types: Cigarettes   Quit date: 1982   Years since quitting: 40.6  Smokeless Tobacco Never   Counseling given: Not Answered   Outpatient Medications Prior to Visit  Medication Sig Dispense Refill   albuterol (VENTOLIN HFA) 108 (90 Base) MCG/ACT inhaler Inhale 2 puffs into the lungs every 6 (six) hours as needed for wheezing or shortness of breath. 8 g 6   amiodarone (PACERONE) 200 MG tablet Take 1 tablet (200 mg total) by mouth daily. 90 tablet 3   apixaban (ELIQUIS) 5 MG TABS tablet Take 1 tablet (5 mg total) by mouth 2 (two) times  daily. 180 tablet 3   B Complex Vitamins (VITAMIN B COMPLEX PO) Take 1 tablet by mouth every other day.      clobetasol ointment (TEMOVATE) 0.05 % APPLY TO AFFECTED AREA TWICE A DAY     famotidine (PEPCID) 20 MG tablet Take 20 mg by mouth 2 (two) times daily.     ISOtretinoin (ACCUTANE) 40 MG capsule Take 40 mg by mouth 2 (two) times daily.      levothyroxine (SYNTHROID, LEVOTHROID) 100 MCG tablet Take 100 mcg by  mouth daily before breakfast.      loratadine (CLARITIN) 10 MG tablet Take 10 mg by mouth daily.      metoprolol succinate (TOPROL-XL) 50 MG 24 hr tablet Take 1 tablet (50 mg total) by mouth daily. Take with or immediately following a meal. 90 tablet 3   montelukast (SINGULAIR) 10 MG tablet TAKE 1 TABLET BY MOUTH EVERYDAY AT BEDTIME 90 tablet 1   mycophenolate (CELLCEPT) 500 MG tablet Take 500 mg by mouth 2 (two) times daily.     predniSONE (DELTASONE) 20 MG tablet Take 1 tablet (20 mg total) by mouth daily with breakfast. 5 tablet 0   triamcinolone (KENALOG) 0.1 % Apply 1 application topically daily as needed (lichen planus flare).     Turmeric 500 MG CAPS Take by mouth. Take as directed     rosuvastatin (CRESTOR) 10 MG tablet Take 1 tablet (10 mg total) by mouth daily. 90 tablet 3   No facility-administered medications prior to visit.     Review of Systems:   Constitutional:   No  weight loss, night sweats,  Fevers, chills,  +fatigue, or  lassitude.  HEENT:   No headaches,  Difficulty swallowing,  Tooth/dental problems, or  Sore throat,                No sneezing, itching, ear ache,  +nasal congestion, post nasal drip,   CV:  No chest pain,  Orthopnea, PND, swelling in lower extremities, anasarca, dizziness, palpitations, syncope.   GI  No heartburn, indigestion, abdominal pain, nausea, vomiting, diarrhea, change in bowel habits, loss of appetite, bloody stools.   Resp:   No chest wall deformity  Skin: no rash or lesions.  GU: no dysuria, change in color of urine, no urgency or frequency.  No flank pain, no hematuria   MS:  No joint pain or swelling.  No decreased range of motion.  No back pain.    Physical Exam  BP 116/70 (BP Location: Left Arm, Patient Position: Sitting, Cuff Size: Normal)   Pulse 71   Temp 98 F (36.7 C) (Oral)   Ht 6' (1.829 m)   Wt (!) 305 lb 12.8 oz (138.7 kg)   SpO2 96%   BMI 41.47 kg/m   GEN: A/Ox3; pleasant , NAD, well nourished    HEENT:   [place]/AT,   NOSE-clear, THROAT-clear, no lesions, no postnasal drip or exudate noted.   NECK:  Supple w/ fair ROM; no JVD; normal carotid impulses w/o bruits; no thyromegaly or nodules palpated; no lymphadenopathy.    RESP  Clear  P & A; w/o, wheezes/ rales/ or rhonchi. no accessory muscle use, no dullness to percussion  CARD:  RRR, no m/r/g, no peripheral edema, pulses intact, no cyanosis or clubbing.  GI:   Soft & nt; nml bowel sounds; no organomegaly or masses detected.   Musco: Warm bil, no deformities or joint swelling noted.   Neuro: alert, no focal deficits noted.    Skin: Warm,  no lesions or rashes    Lab Results:  CBC    Component Value Date/Time   WBC 6.4 03/29/2020 1148   RBC 4.20 (L) 03/29/2020 1148   HGB 13.3 04/12/2020 1034   HGB 12.6 (L) 02/20/2020 0858   HCT 39.0 04/12/2020 1034   HCT 36.6 (L) 02/20/2020 0858   PLT 229 03/29/2020 1148   PLT 279 02/20/2020 0858   MCV 92.4 03/29/2020 1148   MCV 86 02/20/2020 0858   MCH 29.5 03/29/2020 1148   MCHC 32.0 03/29/2020 1148   RDW 15.8 (H) 03/29/2020 1148   RDW 14.1 02/20/2020 0858   LYMPHSABS 1.7 02/16/2020 1155   LYMPHSABS 1.4 01/01/2020 1359   MONOABS 0.9 02/16/2020 1155   EOSABS 0.1 02/16/2020 1155   EOSABS 0.1 01/01/2020 1359   BASOSABS 0.1 02/16/2020 1155   BASOSABS 0.1 01/01/2020 1359    BMET    Component Value Date/Time   NA 138 10/22/2020 0848   K 3.9 10/22/2020 0848   CL 102 10/22/2020 0848   CO2 21 10/22/2020 0848   GLUCOSE 100 (H) 10/22/2020 0848   GLUCOSE 102 (H) 04/12/2020 1034   BUN 16 10/22/2020 0848   CREATININE 1.21 10/22/2020 0848   CALCIUM 9.3 10/22/2020 0848   GFRNONAA 64 05/20/2020 1000   GFRNONAA >60 03/29/2020 1148   GFRAA 74 05/20/2020 1000    BNP    Component Value Date/Time   BNP 184.6 (H) 01/22/2020 1322    ProBNP No results found for: PROBNP  Imaging: No results found.    PFT Results Latest Ref Rng & Units 06/12/2019  FVC-Pre L 4.00  FVC-Predicted Pre % 84   FVC-Post L 4.07  FVC-Predicted Post % 86  Pre FEV1/FVC % % 76  Post FEV1/FCV % % 73  FEV1-Pre L 3.04  FEV1-Predicted Pre % 87  FEV1-Post L 2.98  DLCO uncorrected ml/min/mmHg 24.05  DLCO UNC% % 87  DLVA Predicted % 94  TLC L 6.46  TLC % Predicted % 86  RV % Predicted % 87    No results found for: NITRICOXIDE      Assessment & Plan:   No problem-specific Assessment & Plan notes found for this encounter.     Rexene Edison, NP 12/14/2020

## 2020-12-14 NOTE — Patient Instructions (Addendum)
Chest xray today .  Albuterol inhaler As needed  Wheezing  Albuterol Nebs every 6hr as needed for wheezing .  Continue on Singulair '10mg'$  At bedtime .  Zyrtec '10mg'$  At bedtime As needed   Saline nasal spray Laretta Alstrom Twice daily  .  Flonase 2 puff daily As needed   Mucinex  Twice daily  As needed  Cough/congestion  Delsym 2 tsp Twice daily  As needed  cough .    Restart CPAP At bedtime   Wear CPAP all night long .  Work on healthy weight  Do not drive if sleepy .   Follow up with Dr. Valeta Harms in 4  months with PFT and As needed   Please contact office for sooner follow up if symptoms do not improve or worsen or seek emergency care

## 2020-12-16 DIAGNOSIS — R9389 Abnormal findings on diagnostic imaging of other specified body structures: Secondary | ICD-10-CM | POA: Insufficient documentation

## 2020-12-16 NOTE — Assessment & Plan Note (Signed)
Severe obstructive sleep apnea I recommend that he restart CPAP and encouraged on compliance  Plan  Patient Instructions  Chest xray today .  Albuterol inhaler As needed  Wheezing  Albuterol Nebs every 6hr as needed for wheezing .  Continue on Singulair '10mg'$  At bedtime .  Zyrtec '10mg'$  At bedtime As needed   Saline nasal spray Laretta Alstrom Twice daily  .  Flonase 2 puff daily As needed   Mucinex  Twice daily  As needed  Cough/congestion  Delsym 2 tsp Twice daily  As needed  cough .    Restart CPAP At bedtime   Wear CPAP all night long .  Work on healthy weight  Do not drive if sleepy .   Follow up with Dr. Valeta Harms in 4  months with PFT and As needed   Please contact office for sooner follow up if symptoms do not improve or worsen or seek emergency care

## 2020-12-16 NOTE — Assessment & Plan Note (Signed)
Recent exacerbation post COVID-19 infection.  Patient is clinically improving.  We will check chest x-ray today.  Check PFTs on return in 3 to 4 months.  Plan  . Patient Instructions  Chest xray today .  Albuterol inhaler As needed  Wheezing  Albuterol Nebs every 6hr as needed for wheezing .  Continue on Singulair '10mg'$  At bedtime .  Zyrtec '10mg'$  At bedtime As needed   Saline nasal spray Todd Harrison Twice daily  .  Flonase 2 puff daily As needed   Mucinex  Twice daily  As needed  Cough/congestion  Delsym 2 tsp Twice daily  As needed  cough .    Restart CPAP At bedtime   Wear CPAP all night long .  Work on healthy weight  Do not drive if sleepy .   Follow up with Dr. Valeta Harms in 4  months with PFT and As needed   Please contact office for sooner follow up if symptoms do not improve or worsen or seek emergency care

## 2020-12-16 NOTE — Assessment & Plan Note (Addendum)
Late [id number] CT chest August 11, 2020  Incidental findings : , subtle hypodense lesion on the liver measuring 2.1 cm.  And stable aortic aneurysm.  We reviewed his high-resolution CT chest from April.  Incidental findings as above.  Have asked him to follow-up with his primary care provider and if any additional liver imaging is indicated.  Also would follow-up with cardiology regarding stable aortic aneurysm measuring 4.0 cm Patient aware and will contact PCP and cardiology

## 2021-01-02 ENCOUNTER — Other Ambulatory Visit: Payer: Self-pay | Admitting: Adult Health

## 2021-01-02 DIAGNOSIS — R058 Other specified cough: Secondary | ICD-10-CM

## 2021-01-02 DIAGNOSIS — R059 Cough, unspecified: Secondary | ICD-10-CM

## 2021-01-02 DIAGNOSIS — R062 Wheezing: Secondary | ICD-10-CM

## 2021-01-02 DIAGNOSIS — R0989 Other specified symptoms and signs involving the circulatory and respiratory systems: Secondary | ICD-10-CM

## 2021-02-01 ENCOUNTER — Other Ambulatory Visit: Payer: Self-pay

## 2021-02-01 ENCOUNTER — Other Ambulatory Visit: Payer: Medicare HMO

## 2021-02-01 DIAGNOSIS — Z79899 Other long term (current) drug therapy: Secondary | ICD-10-CM

## 2021-02-01 DIAGNOSIS — I4819 Other persistent atrial fibrillation: Secondary | ICD-10-CM

## 2021-02-01 LAB — TSH: TSH: 4.13 u[IU]/mL (ref 0.450–4.500)

## 2021-02-01 LAB — T4, FREE: Free T4: 1.39 ng/dL (ref 0.82–1.77)

## 2021-03-08 MED ORDER — METOPROLOL SUCCINATE ER 50 MG PO TB24: 50.0000 mg | ORAL_TABLET | Freq: Every day | ORAL | 3 refills | Status: DC

## 2021-03-16 ENCOUNTER — Other Ambulatory Visit: Payer: Self-pay | Admitting: *Deleted

## 2021-03-16 MED ORDER — APIXABAN 5 MG PO TABS: 5.0000 mg | ORAL_TABLET | Freq: Two times a day (BID) | ORAL | 1 refills | Status: DC

## 2021-03-16 NOTE — Telephone Encounter (Signed)
Prescription refill request for Eliquis received. Indication: afib  Last office visit: Todd Harrison 10/22/2020 Scr: 1.21, 10/22/2020 Age: 75 yo  Weight: 138.7 kg   Refill sent.

## 2021-05-03 NOTE — Progress Notes (Signed)
Electrophysiology Office Follow up Visit Note:    Date:  05/04/2021   ID:  Todd Harrison, DOB 1946-05-04, MRN [id number]  PCP:  Todd Dy, MD  [facility] Cardiologist:  Todd Bergeron, MD  [facility] Electrophysiologist:  Todd Epley, MD    Interval History:    Todd Harrison is a 75 y.o. male who presents for a follow up visit. I last saw the patient 10/22/2020 for his persistent atrial fibrillation. He is on amiodarone for his AF. Ablation was previously deferred because of his BMI.  He is doing well without recurrence of A. fib.  He is very interested in stopping amiodarone given the potential for the long-term side effects and drug interactions with some of his immunosuppressive medications.  He tells me that since his pneumonia resolved he is not had a recurrence of the arrhythmia.      Past Medical History:  Diagnosis Date   Lichen planus    Methotrexate, long term, current use    Psoriasis    Thyroid disease     Past Surgical History:  Procedure Laterality Date   CARDIOVERSION N/A 02/23/2020   Procedure: CARDIOVERSION;  Surgeon: Werner Lean, MD;  Location: [place] ENDOSCOPY;  Service: Cardiovascular;  Laterality: N/A;   CARDIOVERSION N/A 04/12/2020   Procedure: CARDIOVERSION;  Surgeon: Buford Dresser, MD;  Location: [facility] ENDOSCOPY;  Service: Cardiovascular;  Laterality: N/A;   CATARACT EXTRACTION Left    2019    Current Medications: Current Meds  Medication Sig   albuterol (VENTOLIN HFA) 108 (90 Base) MCG/ACT inhaler Inhale 2 puffs into the lungs every 6 (six) hours as needed for wheezing or shortness of breath.   amiodarone (PACERONE) 200 MG tablet Take 1 tablet (200 mg total) by mouth daily.   apixaban (ELIQUIS) 5 MG TABS tablet Take 1 tablet (5 mg total) by mouth 2 (two) times daily.   B Complex Vitamins (VITAMIN B COMPLEX PO) Take 1 tablet by mouth every other day.    Cholecalciferol (VITAMIN D3 PO) Take 1 tablet by mouth  daily.   clobetasol ointment (TEMOVATE) 0.05 % APPLY TO AFFECTED AREA TWICE A DAY   famotidine (PEPCID) 20 MG tablet Take 20 mg by mouth 2 (two) times daily.   ISOtretinoin (ACCUTANE) 40 MG capsule Take 40 mg by mouth 2 (two) times daily.    levothyroxine (SYNTHROID, LEVOTHROID) 100 MCG tablet Take 112 mcg by mouth daily before breakfast.   loratadine (CLARITIN) 10 MG tablet Take 10 mg by mouth daily.    metoprolol succinate (TOPROL-XL) 50 MG 24 hr tablet Take 1 tablet (50 mg total) by mouth daily. Take with or immediately following a meal.   montelukast (SINGULAIR) 10 MG tablet TAKE 1 TABLET BY MOUTH EVERYDAY AT BEDTIME   mycophenolate (CELLCEPT) 500 MG tablet Take 500 mg by mouth 2 (two) times daily.   predniSONE (DELTASONE) 20 MG tablet Take 1 tablet (20 mg total) by mouth daily with breakfast.   triamcinolone (KENALOG) 0.1 % Apply 1 application topically daily as needed (lichen planus flare).   Turmeric 500 MG CAPS Take by mouth. Take as directed     Allergies:   Gold and Mixed grasses   Social History   Socioeconomic History   Marital status: Married    Spouse name: Not on file   Number of children: Not on file   Years of education: Not on file   Highest education level: Not on file  Occupational History   Not on file  Tobacco Use   Smoking status: Former    Types: Cigarettes    Quit date: 1982    Years since quitting: 41.0   Smokeless tobacco: Never  Vaping Use   Vaping Use: Not on file  Substance and Sexual Activity   Alcohol use: Yes    Alcohol/week: 1.0 standard drink    Types: 1 Glasses of wine per week   Drug use: No   Sexual activity: Not on file  Other Topics Concern   Not on file  Social History Narrative   Not on file   Social Determinants of Health   Financial Resource Strain: Not on file  Food Insecurity: Not on file  Transportation Needs: Not on file  Physical Activity: Not on file  Stress: Not on file  Social Connections: Not on file      Family History: The patient's family history includes Diabetes in his mother; Stroke in his mother.  ROS:   Please see the history of present illness.    All other systems reviewed and are negative.  EKGs/Labs/Other Studies Reviewed:    The following studies were reviewed today:  10/22/2020 Labwork AST 18 ALT 14 TSH 5.170 FT4 1.26  02/01/2021 Labwork TSH 4.130   EKG:  The ekg ordered today demonstrates sinus rhythm  Recent Labs: 10/22/2020: ALT 14; BUN 16; Creatinine, Ser 1.21; Potassium 3.9; Sodium 138 02/01/2021: TSH 4.130  Recent Lipid Panel    Component Value Date/Time   CHOL 137 06/09/2020 0929   TRIG 143 06/09/2020 0929   HDL 37 (L) 06/09/2020 0929   CHOLHDL 3.7 06/09/2020 0929   LDLCALC 75 06/09/2020 0929    Physical Exam:    VS:  BP 120/68    Pulse 67    Ht 6' (1.829 m)    Wt (!) 305 lb 12.8 oz (138.7 kg)    SpO2 96%    BMI 41.47 kg/m     Wt Readings from Last 3 Encounters:  05/04/21 (!) 305 lb 12.8 oz (138.7 kg)  12/14/20 (!) 305 lb 12.8 oz (138.7 kg)  10/22/20 (!) 307 lb (139.3 kg)     GEN:  Well nourished, well developed in no acute distress.  Obese HEENT: Normal NECK: No JVD; No carotid bruits LYMPHATICS: No lymphadenopathy CARDIAC: RRR, no murmurs, rubs, gallops RESPIRATORY:  Clear to auscultation without rales, wheezing or rhonchi  ABDOMEN: Soft, non-tender, non-distended MUSCULOSKELETAL:  No edema; No deformity  SKIN: Warm and dry NEUROLOGIC:  Alert and oriented x 3 PSYCHIATRIC:  Normal affect        ASSESSMENT:    1. Persistent atrial fibrillation ([place])   2. OSA (obstructive sleep apnea)   3. Morbid obesity ([place])   4. Encounter for long-term (current) use of high-risk medication    PLAN:    In order of problems listed above:  #Persistent atrial fibrillation No recurrence on amiodarone and since his pneumonia resolved. He would like to trial coming off of amiodarone which I think is very reasonable.  If he has recurrence in  the future, could consider alternative antiarrhythmic drug versus ablation. For now, continue Eliquis for stroke prophylaxis.  #OSA CPAP encouraged  Follow-up 6 months with APP.  If recurrence of A. fib, could consider ablation versus alternative antiarrhythmic drug.     Medication Adjustments/Labs and Tests Ordered: Current medicines are reviewed at length with the patient today.  Concerns regarding medicines are outlined above.  Orders Placed This Encounter  Procedures   EKG 12-Lead   No orders of  the defined types were placed in this encounter.    Signed, Lars Mage, MD, [facility], [facility] 05/04/2021 10:26 AM    Electrophysiology [facility]

## 2021-05-04 ENCOUNTER — Other Ambulatory Visit: Payer: Self-pay

## 2021-05-04 ENCOUNTER — Ambulatory Visit: Payer: Medicare HMO | Admitting: Cardiology

## 2021-05-04 ENCOUNTER — Encounter: Payer: Self-pay | Admitting: Cardiology

## 2021-05-04 VITALS — BP 120/68 | HR 67 | Ht 72.0 in | Wt 305.8 lb

## 2021-05-04 DIAGNOSIS — G4733 Obstructive sleep apnea (adult) (pediatric): Secondary | ICD-10-CM | POA: Diagnosis not present

## 2021-05-04 DIAGNOSIS — I4819 Other persistent atrial fibrillation: Secondary | ICD-10-CM | POA: Diagnosis not present

## 2021-05-04 DIAGNOSIS — Z79899 Other long term (current) drug therapy: Secondary | ICD-10-CM

## 2021-05-04 MED ORDER — ROSUVASTATIN CALCIUM 10 MG PO TABS: 10.0000 mg | ORAL_TABLET | Freq: Every day | ORAL | 3 refills | Status: DC

## 2021-05-04 NOTE — Patient Instructions (Addendum)
Medication Instructions:  Your physician has recommended you make the following change in your medication:    STOP amiodarone  Lab Work: None ordered. If you have labs (blood work) drawn today and your tests are completely normal, you will receive your results only by: Home (if you have MyChart) OR A paper copy in the mail If you have any lab test that is abnormal or we need to change your treatment, we will call you to review the results.  Testing/Procedures: None ordered.  Follow-Up: At [facility], you and your health needs are our priority.  As part of our continuing mission to provide you with exceptional heart care, we have created designated Provider Care Teams.  These Care Teams include your primary Cardiologist (physician) and Advanced Practice Providers (APPs -  Physician Assistants and Nurse Practitioners) who all work together to provide you with the care you need, when you need it.  Your next appointment:   Your physician wants you to follow-up in: 6 months with one of the following Advanced Practice Providers on your designated Care Team:   Tommye Standard, [place] Legrand Como "Jonni Sanger" [place], [place] You will receive a reminder letter in the mail two months in advance. If you don't receive a letter, please call our office to schedule the follow-up appointment.

## 2021-06-17 ENCOUNTER — Encounter: Payer: Self-pay | Admitting: Pulmonary Disease

## 2021-06-17 ENCOUNTER — Other Ambulatory Visit: Payer: Self-pay

## 2021-06-17 ENCOUNTER — Other Ambulatory Visit: Payer: Self-pay | Admitting: Pulmonary Disease

## 2021-06-17 ENCOUNTER — Ambulatory Visit: Payer: Medicare HMO | Admitting: Pulmonary Disease

## 2021-06-17 ENCOUNTER — Ambulatory Visit (INDEPENDENT_AMBULATORY_CARE_PROVIDER_SITE_OTHER): Payer: Medicare HMO | Admitting: Pulmonary Disease

## 2021-06-17 VITALS — BP 106/64 | HR 68 | Temp 97.7°F | Ht 72.0 in | Wt 303.0 lb

## 2021-06-17 DIAGNOSIS — J4530 Mild persistent asthma, uncomplicated: Secondary | ICD-10-CM

## 2021-06-17 DIAGNOSIS — R053 Chronic cough: Secondary | ICD-10-CM | POA: Diagnosis not present

## 2021-06-17 DIAGNOSIS — U071 COVID-19: Secondary | ICD-10-CM | POA: Diagnosis not present

## 2021-06-17 DIAGNOSIS — R0602 Shortness of breath: Secondary | ICD-10-CM | POA: Diagnosis not present

## 2021-06-17 DIAGNOSIS — J4521 Mild intermittent asthma with (acute) exacerbation: Secondary | ICD-10-CM

## 2021-06-17 LAB — PULMONARY FUNCTION TEST
DL/VA % pred: 76 %
DL/VA: 3.02 ml/min/mmHg/L
DLCO cor % pred: 73 %
DLCO cor: 19.79 ml/min/mmHg
DLCO unc % pred: 73 %
DLCO unc: 19.79 ml/min/mmHg
FEF 25-75 Post: 3.2 L/sec
FEF 25-75 Pre: 2.65 L/sec
FEF2575-%Change-Post: 20 %
FEF2575-%Pred-Post: 127 %
FEF2575-%Pred-Pre: 105 %
FEV1-%Change-Post: 4 %
FEV1-%Pred-Post: 100 %
FEV1-%Pred-Pre: 96 %
FEV1-Post: 3.41 L
FEV1-Pre: 3.28 L
FEV1FVC-%Change-Post: 3 %
FEV1FVC-%Pred-Pre: 103 %
FEV6-%Change-Post: 0 %
FEV6-%Pred-Post: 98 %
FEV6-%Pred-Pre: 98 %
FEV6-Post: 4.34 L
FEV6-Pre: 4.33 L
FEV6FVC-%Change-Post: 0 %
FEV6FVC-%Pred-Post: 104 %
FEV6FVC-%Pred-Pre: 105 %
FVC-%Change-Post: 1 %
FVC-%Pred-Post: 94 %
FVC-%Pred-Pre: 93 %
FVC-Post: 4.39 L
FVC-Pre: 4.34 L
Post FEV1/FVC ratio: 78 %
Post FEV6/FVC ratio: 99 %
Pre FEV1/FVC ratio: 75 %
Pre FEV6/FVC Ratio: 100 %
RV % pred: 99 %
RV: 2.61 L
TLC % pred: 95 %
TLC: 7.09 L

## 2021-06-17 NOTE — Progress Notes (Signed)
Synopsis: Referred in Dec 2020 for sinus infections by Lorene Dy, MD  Subjective:   PATIENT ID: Todd Harrison GENDER: male DOB: 1945/07/02, MRN: [id number]  Chief Complaint  Patient presents with   Follow-up    Patient is here to go over pft results.     PMH of recurrent sinus infectious and SOB/Cough. [name] was was sick at this time. Symptoms started 6 weeks ago. Usually if his sinuses are inflammated he has tooth pain. He didn't have it this time. He has been coughing now for >6 weeks. 4 weeks ago now he was up at night time with cough. His cough is 99% better since last week. Family members were swabbed for covid and were negative. He has not been tested for covid. Former smoker quit 1982, quit in 10 years, but smoked 4-5 ppd at the time.  At this time his cough is much better.  He has no chest pain.  Patient denies hemoptysis or dark sputum production.  He states that once he had this appointment made his symptoms started to improve slowly.  He has not ever had any pulmonary function test in the past.  He does have some sleep troubles.  He wakes up with a hoarse voice.  He gets up several times in the middle of the night to pee.  He does snore per his wife.  He has never had a sleep study.  Occupation: Patient is a Engineer, technical sales, upright base, electric bass and trombone.  OV 09/16/2020: Patient last seen by me in 2020.  Here today for follow-up.  Has subsequently been seen by Patricia Nettle.  Last seen in the office in January 2022.  Not taking any of his inhalers at this time.  He said he felt like he was doing better so he stopped taking his inhaler medications.  He is still taking his immunosuppression on occasion not taking it regularly for the lichen planus.  He is taking his Accutane regularly.  He did have a home sleep study that was completed that showed severe OSA but he does not wear this routinely.  He tries to wear it is much as he can at nighttime but tends to pull it off  when he is asleep and he finds it in the floor or disconnected when he wakes up.  His reflux is also not controlled but he does try to titrate medications to help with his reflux.  OV 06/17/2021: Here today for follow up after pfts. Review pfts results with patient. Cough is better. No recent exacerbations.  From a respiratory standpoint doing well.  Has no significant complaints today.  He does have issues ongoing with his medications for lichen planus.  His lower extremities are a little bit more swollen today than usual he says.  Lichen planus on his feet make mobility much more difficult as he gets older.   Past Medical History:  Diagnosis Date   Lichen planus    Methotrexate, long term, current use    Psoriasis    Thyroid disease      Family History  Problem Relation Age of Onset   Diabetes Mother    Stroke Mother      Past Surgical History:  Procedure Laterality Date   CARDIOVERSION N/A 02/23/2020   Procedure: CARDIOVERSION;  Surgeon: Werner Lean, MD;  Location: [place] ENDOSCOPY;  Service: Cardiovascular;  Laterality: N/A;   CARDIOVERSION N/A 04/12/2020   Procedure: CARDIOVERSION;  Surgeon: Buford Dresser, MD;  Location: [place];  Service: Cardiovascular;  Laterality: N/A;   CATARACT EXTRACTION Left    2019    Social History   Socioeconomic History   Marital status: Married    Spouse name: Not on file   Number of children: Not on file   Years of education: Not on file   Highest education level: Not on file  Occupational History   Not on file  Tobacco Use   Smoking status: Former    Types: Cigarettes    Quit date: 1982    Years since quitting: 41.1   Smokeless tobacco: Never  Vaping Use   Vaping Use: Not on file  Substance and Sexual Activity   Alcohol use: Yes    Alcohol/week: 1.0 standard drink    Types: 1 Glasses of wine per week   Drug use: No   Sexual activity: Not on file  Other Topics Concern   Not on file  Social History  Narrative   Not on file   Social Determinants of Health   Financial Resource Strain: Not on file  Food Insecurity: Not on file  Transportation Needs: Not on file  Physical Activity: Not on file  Stress: Not on file  Social Connections: Not on file  Intimate Partner Violence: Not on file     Allergies  Allergen Reactions   Gold Itching   Mixed Grasses     unknown     Outpatient Medications Prior to Visit  Medication Sig Dispense Refill   albuterol (VENTOLIN HFA) 108 (90 Base) MCG/ACT inhaler Inhale 2 puffs into the lungs every 6 (six) hours as needed for wheezing or shortness of breath. 8 g 6   apixaban (ELIQUIS) 5 MG TABS tablet Take 1 tablet (5 mg total) by mouth 2 (two) times daily. 180 tablet 1   B Complex Vitamins (VITAMIN B COMPLEX PO) Take 1 tablet by mouth every other day.      Cholecalciferol (VITAMIN D3 PO) Take 1 tablet by mouth daily.     clobetasol ointment (TEMOVATE) 0.05 % APPLY TO AFFECTED AREA TWICE A DAY     famotidine (PEPCID) 20 MG tablet Take 20 mg by mouth 2 (two) times daily.     hydroxychloroquine (PLAQUENIL) 200 MG tablet Take 200 mg by mouth 2 (two) times daily.     ISOtretinoin (ACCUTANE) 40 MG capsule Take 40 mg by mouth 2 (two) times daily.      levothyroxine (SYNTHROID, LEVOTHROID) 100 MCG tablet Take 112 mcg by mouth daily before breakfast.     loratadine (CLARITIN) 10 MG tablet Take 10 mg by mouth daily.      montelukast (SINGULAIR) 10 MG tablet TAKE 1 TABLET BY MOUTH EVERYDAY AT BEDTIME 90 tablet 1   rosuvastatin (CRESTOR) 10 MG tablet Take 1 tablet (10 mg total) by mouth daily. 90 tablet 3   triamcinolone (KENALOG) 0.1 % Apply 1 application topically daily as needed (lichen planus flare).     Turmeric 500 MG CAPS Take by mouth. Take as directed     metoprolol succinate (TOPROL-XL) 50 MG 24 hr tablet Take 1 tablet (50 mg total) by mouth daily. Take with or immediately following a meal. 90 tablet 3   mycophenolate (CELLCEPT) 500 MG tablet Take 500  mg by mouth 2 (two) times daily. (Patient not taking: Reported on 06/17/2021)     predniSONE (DELTASONE) 20 MG tablet Take 1 tablet (20 mg total) by mouth daily with breakfast. (Patient not taking: Reported on 06/17/2021) 5 tablet 0   No facility-administered medications  prior to visit.    Review of Systems  Constitutional:  Negative for chills, fever, malaise/fatigue and weight loss.  HENT:  Negative for hearing loss, sore throat and tinnitus.   Eyes:  Negative for blurred vision and double vision.  Respiratory:  Positive for cough. Negative for hemoptysis, sputum production, shortness of breath, wheezing and stridor.   Cardiovascular:  Negative for chest pain, palpitations, orthopnea, leg swelling and PND.  Gastrointestinal:  Negative for abdominal pain, constipation, diarrhea, heartburn, nausea and vomiting.  Genitourinary:  Negative for dysuria, hematuria and urgency.  Musculoskeletal:  Negative for joint pain and myalgias.  Skin:  Negative for itching and rash.  Neurological:  Negative for dizziness, tingling, weakness and headaches.  Endo/Heme/Allergies:  Negative for environmental allergies. Does not bruise/bleed easily.  Psychiatric/Behavioral:  Negative for depression. The patient is not nervous/anxious and does not have insomnia.   All other systems reviewed and are negative.   Objective:  Physical Exam Vitals reviewed.  Constitutional:      General: He is not in acute distress.    Appearance: He is well-developed. He is obese.  HENT:     Head: Normocephalic and atraumatic.  Eyes:     General: No scleral icterus.    Conjunctiva/sclera: Conjunctivae normal.     Pupils: Pupils are equal, round, and reactive to light.  Neck:     Vascular: No JVD.     Trachea: No tracheal deviation.  Cardiovascular:     Rate and Rhythm: Normal rate and regular rhythm.     Heart sounds: Normal heart sounds. No murmur heard. Pulmonary:     Effort: Pulmonary effort is normal. No tachypnea,  accessory muscle usage or respiratory distress.     Breath sounds: No stridor. No wheezing, rhonchi or rales.  Abdominal:     General: There is no distension.     Palpations: Abdomen is soft.     Tenderness: There is no abdominal tenderness.     Comments: Obese  Musculoskeletal:        General: No tenderness.     Cervical back: Neck supple.     Right lower leg: Edema present.     Left lower leg: Edema present.  Lymphadenopathy:     Cervical: No cervical adenopathy.  Skin:    General: Skin is warm and dry.     Capillary Refill: Capillary refill takes less than 2 seconds.     Findings: No rash.  Neurological:     Mental Status: He is alert and oriented to person, place, and time.  Psychiatric:        Behavior: Behavior normal.     Vitals:   06/17/21 1332  BP: 106/64  Pulse: 68  Temp: 97.7 F (36.5 C)  TempSrc: Oral  SpO2: 95%  Weight: (!) 303 lb (137.4 kg)  Height: 6' (1.829 m)   95% on RA BMI Readings from Last 3 Encounters:  06/17/21 41.09 kg/m  05/04/21 41.47 kg/m  12/14/20 41.47 kg/m   Wt Readings from Last 3 Encounters:  06/17/21 (!) 303 lb (137.4 kg)  05/04/21 (!) 305 lb 12.8 oz (138.7 kg)  12/14/20 (!) 305 lb 12.8 oz (138.7 kg)     CBC    Component Value Date/Time   WBC 6.4 03/29/2020 1148   RBC 4.20 (L) 03/29/2020 1148   HGB 13.3 04/12/2020 1034   HGB 12.6 (L) 02/20/2020 0858   HCT 39.0 04/12/2020 1034   HCT 36.6 (L) 02/20/2020 0858   PLT 229 03/29/2020 1148  PLT 279 02/20/2020 0858   MCV 92.4 03/29/2020 1148   MCV 86 02/20/2020 0858   MCH 29.5 03/29/2020 1148   MCHC 32.0 03/29/2020 1148   RDW 15.8 (H) 03/29/2020 1148   RDW 14.1 02/20/2020 0858   LYMPHSABS 1.7 02/16/2020 1155   LYMPHSABS 1.4 01/01/2020 1359   MONOABS 0.9 02/16/2020 1155   EOSABS 0.1 02/16/2020 1155   EOSABS 0.1 01/01/2020 1359   BASOSABS 0.1 02/16/2020 1155   BASOSABS 0.1 01/01/2020 1359    Chest Imaging: CXR - 2018 - normal   CT Chest 08/11/2020:  CT imaging was  complete in April this revealed basilar subpleural reticulations and groundglass concern for possible ILD.  He also has some small airway disease.  I think after reviewing the information and his clinical picture he appears to be volume overloaded and this areas of groundglass and subpleural involvement likely are related to his positive fluid balance and possibly pulmonary edema. The patient's images have been independently reviewed by me.     Pulmonary Functions Testing Results: PFT Results Latest Ref Rng & Units 06/17/2021 06/12/2019  FVC-Pre L 4.34 4.00  FVC-Predicted Pre % 93 84  FVC-Post L 4.39 4.07  FVC-Predicted Post % 94 86  Pre FEV1/FVC % % 75 76  Post FEV1/FCV % % 78 73  FEV1-Pre L 3.28 3.04  FEV1-Predicted Pre % 96 87  FEV1-Post L 3.41 2.98  DLCO uncorrected ml/min/mmHg 19.79 24.05  DLCO UNC% % 73 87  DLCO corrected ml/min/mmHg 19.79 -  DLCO COR %Predicted % 73 -  DLVA Predicted % 76 94  TLC L 7.09 6.46  TLC % Predicted % 95 86  RV % Predicted % 99 87    FeNO: None   Pathology: None   Echocardiogram:   IMPRESSIONS   1. Left ventricular ejection fraction, by estimation, is 55 to 60%. The  left ventricle has normal function. The left ventricle has no regional  wall motion abnormalities. There is mild concentric left ventricular  hypertrophy. Left ventricular diastolic  function could not be evaluated.   2. Right ventricular systolic function is normal. The right ventricular  size is normal. Tricuspid regurgitation signal is inadequate for assessing  PA pressure.   3. The mitral valve is grossly normal. Mild to moderate mitral valve  regurgitation. No evidence of mitral stenosis.   4. The aortic valve is tricuspid. There is moderate calcification of the  aortic valve. There is moderate thickening of the aortic valve. Aortic  valve regurgitation is not visualized. Mild to moderate aortic valve  sclerosis/calcification is present,  without any evidence of aortic  stenosis.   5. Aortic dilatation noted. There is mild dilatation of the aortic root,  measuring 42 mm.   6. The inferior vena cava is dilated in size with >50% respiratory  variability, suggesting right atrial pressure of 8 mmHg.   Heart Catheterization: None      Assessment & Plan:     ICD-10-CM   1. Mild intermittent asthmatic bronchitis with acute exacerbation  J45.21     2. COVID-19 virus infection  U07.1     3. Morbid obesity due to excess calories (HCC)  E66.01     4. Chronic cough  R05.3       Discussion:  This is a 76 year old gentleman initially seen for evaluation of cough.  His cough is better now.  He does have a longstanding history of smoking, morbidly obese, severe obstructive sleep apnea follows with Dr. Halford Chessman in sleep clinic.  I think his weight plays a huge factor in his medical problems as well as his shortness of breath.  He had pulmonary function test completed prior to today's office visit reveals a normal ratio, normal spirometry, reduced ERV likely to body habitus, normal TLC and a DLCO of 73% predicted.  Plan: They can continue the use of his albuterol as needed. He still has his prescription for Symbicort even though he has stopped using it. I encouraged him to try to stay as active as he could.  Make sure he is compliant with his other medications. I think weight loss will have a significant impact on his respiratory symptoms.  He should follow-up with [place] in 1 year or as needed.    Current Outpatient Medications:    albuterol (VENTOLIN HFA) 108 (90 Base) MCG/ACT inhaler, Inhale 2 puffs into the lungs every 6 (six) hours as needed for wheezing or shortness of breath., Disp: 8 g, Rfl: 6   apixaban (ELIQUIS) 5 MG TABS tablet, Take 1 tablet (5 mg total) by mouth 2 (two) times daily., Disp: 180 tablet, Rfl: 1   B Complex Vitamins (VITAMIN B COMPLEX PO), Take 1 tablet by mouth every other day. , Disp: , Rfl:    Cholecalciferol (VITAMIN D3 PO), Take 1 tablet  by mouth daily., Disp: , Rfl:    clobetasol ointment (TEMOVATE) 0.05 %, APPLY TO AFFECTED AREA TWICE A DAY, Disp: , Rfl:    famotidine (PEPCID) 20 MG tablet, Take 20 mg by mouth 2 (two) times daily., Disp: , Rfl:    hydroxychloroquine (PLAQUENIL) 200 MG tablet, Take 200 mg by mouth 2 (two) times daily., Disp: , Rfl:    ISOtretinoin (ACCUTANE) 40 MG capsule, Take 40 mg by mouth 2 (two) times daily. , Disp: , Rfl:    levothyroxine (SYNTHROID, LEVOTHROID) 100 MCG tablet, Take 112 mcg by mouth daily before breakfast., Disp: , Rfl:    loratadine (CLARITIN) 10 MG tablet, Take 10 mg by mouth daily. , Disp: , Rfl:    montelukast (SINGULAIR) 10 MG tablet, TAKE 1 TABLET BY MOUTH EVERYDAY AT BEDTIME, Disp: 90 tablet, Rfl: 1   rosuvastatin (CRESTOR) 10 MG tablet, Take 1 tablet (10 mg total) by mouth daily., Disp: 90 tablet, Rfl: 3   triamcinolone (KENALOG) 0.1 %, Apply 1 application topically daily as needed (lichen planus flare)., Disp: , Rfl:    Turmeric 500 MG CAPS, Take by mouth. Take as directed, Disp: , Rfl:    metoprolol succinate (TOPROL-XL) 50 MG 24 hr tablet, Take 1 tablet (50 mg total) by mouth daily. Take with or immediately following a meal., Disp: 90 tablet, Rfl: 3   mycophenolate (CELLCEPT) 500 MG tablet, Take 500 mg by mouth 2 (two) times daily. (Patient not taking: Reported on 06/17/2021), Disp: , Rfl:    predniSONE (DELTASONE) 20 MG tablet, Take 1 tablet (20 mg total) by mouth daily with breakfast. (Patient not taking: Reported on 06/17/2021), Disp: 5 tablet, Rfl: 0   Garner Nash, DO [facility] 06/17/2021 1:37 PM

## 2021-06-17 NOTE — Progress Notes (Signed)
PFT done today. 

## 2021-06-17 NOTE — Patient Instructions (Signed)
Thank you for visiting Dr. Valeta Harms at [facility]. Today we recommend the following:  Continue your albuterol is needed  Call us if your symptoms change   Return in about 1 year (around 06/17/2022) for with APP or Dr. Valeta Harms.    Please do your part to reduce the spread of COVID-19.

## 2021-09-05 ENCOUNTER — Encounter: Payer: Self-pay | Admitting: Pulmonary Disease

## 2021-09-05 DIAGNOSIS — R058 Other specified cough: Secondary | ICD-10-CM

## 2021-09-05 DIAGNOSIS — R059 Cough, unspecified: Secondary | ICD-10-CM

## 2021-09-05 DIAGNOSIS — R0989 Other specified symptoms and signs involving the circulatory and respiratory systems: Secondary | ICD-10-CM

## 2021-09-06 MED ORDER — MONTELUKAST SODIUM 10 MG PO TABS: 10.0000 mg | ORAL_TABLET | Freq: Every day | ORAL | 3 refills | Status: DC

## 2021-09-16 ENCOUNTER — Other Ambulatory Visit: Payer: Self-pay | Admitting: Cardiology

## 2021-09-16 DIAGNOSIS — I4891 Unspecified atrial fibrillation: Secondary | ICD-10-CM

## 2021-09-16 NOTE — Telephone Encounter (Signed)
Prescription refill request for Eliquis received. ?Indication: a fib ?Last office visit:05/04/21 ?Scr: 1.21 ?Age: 75 ?Weight: 137kg ?

## 2022-02-22 ENCOUNTER — Other Ambulatory Visit: Payer: Self-pay | Admitting: Cardiology

## 2022-02-22 DIAGNOSIS — I4891 Unspecified atrial fibrillation: Secondary | ICD-10-CM

## 2022-02-22 NOTE — Telephone Encounter (Addendum)
Eliquis '5mg'$  refill request received. Patient is 76 years old, weight-137.4kg, Crea-1.21 on 10/22/2020-NEEDS LABS, Diagnosis-Afib, and last seen by Dr. Quentin Ore on 05/04/2021. Dose is appropriate based on dosing criteria.   Called pt's PCP in reference to updated labs (CMET or BMET and CBC). They will fax over the labs from 03/09/2021, will await

## 2022-02-22 NOTE — Telephone Encounter (Signed)
Labs received from PCP. Scr 1.04 on 03/09/21. Appropriate dose and refill sent to requested pharmacy.

## 2022-02-28 ENCOUNTER — Other Ambulatory Visit: Payer: Self-pay | Admitting: Cardiology

## 2022-03-23 ENCOUNTER — Other Ambulatory Visit: Payer: Self-pay | Admitting: Cardiology

## 2022-04-10 ENCOUNTER — Encounter: Payer: Self-pay | Admitting: Internal Medicine

## 2022-04-10 ENCOUNTER — Ambulatory Visit: Payer: Medicare HMO | Admitting: Internal Medicine

## 2022-04-10 VITALS — BP 122/78 | HR 81 | Temp 97.3°F | Resp 18 | Ht 72.0 in | Wt 284.5 lb

## 2022-04-10 DIAGNOSIS — J3089 Other allergic rhinitis: Secondary | ICD-10-CM

## 2022-04-10 DIAGNOSIS — L2389 Allergic contact dermatitis due to other agents: Secondary | ICD-10-CM | POA: Diagnosis not present

## 2022-04-10 DIAGNOSIS — L958 Other vasculitis limited to the skin: Secondary | ICD-10-CM

## 2022-04-10 DIAGNOSIS — L439 Lichen planus, unspecified: Secondary | ICD-10-CM | POA: Diagnosis not present

## 2022-04-10 DIAGNOSIS — J4520 Mild intermittent asthma, uncomplicated: Secondary | ICD-10-CM

## 2022-04-10 NOTE — Patient Instructions (Addendum)
Urticarial vasculitis  -Physical exam today very concerning for urticarial vasculitis also some red flag symptoms with tenderness, lack of response to antihistamines, response to prednisone -You need to get a skin biopsy recommend following up with dermatology for this -Skin biopsy needs to be obtained on the active lesion (i.e. red inflamed part of skin) -Please hold any medical treatment till his biopsy is obtained -Please take pictures of any new lesions from start to finish -We will get screening labs today for urticarial vasculitis  -ESR, CRP, complements, C1q ANA, ANCA  -After skin biopsies start Zyrtec 10 mg twice a day  Continue to avoid gold given previous positive patch testing  We can consider updating environmental testing in the future although I do not think this is related to current presentation  Follow up: We will contact you with blood results otherwise follow-up after skin biopsy  Thank you so much for letting me partake in your care today.  Don't hesitate to reach out if you have any additional concerns!  Roney Marion, MD  [facility] [place], [place]

## 2022-04-10 NOTE — Progress Notes (Signed)
New Patient Note  RE: Todd Harrison MRN: [id number] DOB: [birth year] Date of Office Visit: 04/10/2022  Consult requested by: Lorene Dy, MD Primary care provider: Lorene Dy, MD  Chief Complaint: Rash (Itchy rash on stomach for about 6 or 7 months)  History of Present Illness: I had the pleasure of seeing Todd Harrison for initial evaluation at the [facility] on 04/10/2022. He is a 76 y.o. male, who is referred here by Lorene Dy, MD for the evaluation of rash .  Previously seen by our office in spring 2020 for an inflammatory dermatosis of his feet.  Ultimately diagnosed with lichen planus by dermatology.  History obtained from patient, chart review.  Rash:  Symptoms started in June 2023 on left trunk and lasted a week and then resolved and recurred on right side.  Since then it has alternated sides and recurs every few weeks.  It is pruritic, but also tender.  He has no photos.  Denies any associated food, medication or contact exposure.  Denies any association with sun, cold, heat, exercise, pressure, water or vibration exposure.  Denies any associated fevers or arthritis. Lesions are fixed for > 72 hours and resolve with central bruising    Only medication he started prior to onset of symptoms was hydroxychloroquine which he is on for lichen planus.    He follows with dermatology and over Thanksgiving he was treated with prednisone for 14 days.  He also stopped hydroxychloroquine for 21 days, however symptoms recurred on day 7 of prednisone.  He has since restarted hydroxychloroquine.  Denies any prior skin biopsies.  He was screened for fungal infection which was negative.  And environmental testing which was positive to grass  Previously had patch testing which was positive for gold sodium thiosulfate and allergy testing 3 years ago.  He has a prior history of asthma although has not used albuterol or Flovent in many years.  Denies any  significant rhinitis symptoms.  Assessment and Plan: Todd Harrison is a 76 y.o. male with: History of lichen planus, minimal atopic history of mild intermittent asthma with previous patch testing positive to gold environmental testing positive to grass only.  Presenting for 5 months of new rash.  Exam today concerning for urticarial vasculitis based on presentation and red flag symptoms.  He already is established with dermatology will refer back there for skin biopsy.  Screening labs obtained today.  We will hold off on treatment until skin biopsy.Marland Kitchen  Atopic conditions are minimal and well controlled.  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, ANCA screen with reflex titer, Sed Rate (ESR), C-reactive protein, Complement component c1q, C2 complement, ANCA Profile (RDL), CANCELED: ANA Direct w/Reflex if Positive  Lichen planus  Allergic contact dermatitis due to other agents  Other allergic rhinitis  Mild intermittent asthma in adult without complication Plan: Patient Instructions   Urticarial vasculitis  -Physical exam today very concerning for urticarial vasculitis also some red flag symptoms with tenderness, lack of response to antihistamines, response to prednisone -You need to get a skin biopsy recommend following up with dermatology for this -Skin biopsy needs to be obtained on the active lesion (i.e. red inflamed part of skin) -Please hold any medical treatment till his biopsy is obtained -Please take pictures of any new lesions from start to finish -We will get screening labs today for urticarial vasculitis  -ESR, CRP, complements, C1q ANA, ANCA  -After skin biopsies start Zyrtec 10 mg twice a day  Continue  to avoid gold given previous positive patch testing  We can consider updating environmental testing in the future although I do not think this is related to current presentation  Follow up: We will contact you with blood results otherwise follow-up after skin biopsy  Thank you so  much for letting me partake in your care today.  Don't hesitate to reach out if you have any additional concerns!  Roney Marion, MD  [facility] [place]   No orders of the defined types were placed in this encounter.  Lab Orders         C3 and C4         ANCA screen with reflex titer         Sed Rate (ESR)         C-reactive protein         Complement component c1q         C2 complement         ANCA Profile (RDL)      Other allergy screening: Asthma: no Rhino conjunctivitis: no Food allergy: no Medication allergy: no Hymenoptera allergy: no Urticaria: yes Eczema:no History of recurrent infections suggestive of immunodeficency: no  Diagnostics: None done  Past Medical History: Patient Active Problem List   Diagnosis Date Noted   Abnormal CT of the chest 12/16/2020   COVID-19 virus infection 11/25/2020   OSA (obstructive sleep apnea) 05/19/2020   Persistent atrial fibrillation (HCC)    Daytime hypersomnolence 02/16/2020   Tachycardia 01/22/2020   Atrial fibrillation with RVR ([place]) 01/22/2020   Pneumonia 01/09/2020   Asthmatic bronchitis 10/20/2019   Methotrexate, long term, current use    Cough present for greater than 3 weeks 04/30/2019   Shortness of breath 37/62/8315   Complicated acute bronchitis 04/30/2019   Allergic contact dermatitis 09/09/2018   Acquired hypothyroidism 06/09/2016   Rotator cuff tear, right 09/22/2010   METATARSALGIA 11/24/2008   FOOT PAIN, LEFT 11/24/2008   TALIPES CAVUS 11/24/2008   Past Medical History:  Diagnosis Date   Asthma    Lichen planus    Methotrexate, long term, current use    Psoriasis    Thyroid disease    Urticaria    Past Surgical History: Past Surgical History:  Procedure Laterality Date   CARDIOVERSION N/A 02/23/2020   Procedure: CARDIOVERSION;  Surgeon: Werner Lean, MD;  Location: [facility] ENDOSCOPY;  Service: Cardiovascular;  Laterality: N/A;   CARDIOVERSION N/A 04/12/2020    Procedure: CARDIOVERSION;  Surgeon: Buford Dresser, MD;  Location: [facility] ENDOSCOPY;  Service: Cardiovascular;  Laterality: N/A;   CATARACT EXTRACTION Left    2019   TONSILLECTOMY     Medication List:  Current Outpatient Medications  Medication Sig Dispense Refill   B Complex Vitamins (VITAMIN B COMPLEX PO) Take 1 tablet by mouth every other day.      Cholecalciferol (VITAMIN D3 PO) Take 1 tablet by mouth daily.     clobetasol ointment (TEMOVATE) 0.05 % APPLY TO AFFECTED AREA TWICE A DAY     ELIQUIS 5 MG TABS tablet TAKE 1 TABLET BY MOUTH TWICE A DAY 180 tablet 1   famotidine (PEPCID) 20 MG tablet Take 20 mg by mouth 2 (two) times daily.     hydroxychloroquine (PLAQUENIL) 200 MG tablet Take 200 mg by mouth 2 (two) times daily.     ISOtretinoin (ACCUTANE) 40 MG capsule Take 40 mg by mouth 2 (two) times daily.      levothyroxine (SYNTHROID, LEVOTHROID) 100 MCG tablet  Take 112 mcg by mouth daily before breakfast.     loratadine (CLARITIN) 10 MG tablet Take 10 mg by mouth daily.      metoprolol succinate (TOPROL-XL) 50 MG 24 hr tablet Take 1 tablet (50 mg total) by mouth daily. TAKE WITH OR IMMEDIATELY FOLLOWING A MEAL. 30 tablet 1   montelukast (SINGULAIR) 10 MG tablet Take 1 tablet (10 mg total) by mouth at bedtime. 90 tablet 3   rosuvastatin (CRESTOR) 10 MG tablet TAKE 1 TABLET EVERY DAY 30 tablet 1   triamcinolone (KENALOG) 0.1 % Apply 1 application topically daily as needed (lichen planus flare).     Turmeric 500 MG CAPS Take by mouth. Take as directed     No current facility-administered medications for this visit.   Allergies: Allergies  Allergen Reactions   Gold Itching   Mixed Grasses     unknown   Social History: Social History   Socioeconomic History   Marital status: Married    Spouse name: Not on file   Number of children: Not on file   Years of education: Not on file   Highest education level: Not on file  Occupational History   Not on file  Tobacco Use    Smoking status: Former    Types: Cigarettes    Quit date: 1982    Years since quitting: 41.9   Smokeless tobacco: Never  Vaping Use   Vaping Use: Not on file  Substance and Sexual Activity   Alcohol use: Yes    Alcohol/week: 1.0 standard drink of alcohol    Types: 1 Glasses of wine per week   Drug use: No   Sexual activity: Not on file  Other Topics Concern   Not on file  Social History Narrative   Not on file   Social Determinants of Health   Financial Resource Strain: Not on file  Food Insecurity: Not on file  Transportation Needs: Not on file  Physical Activity: Not on file  Stress: Not on file  Social Connections: Not on file   Lives in a single-family home this 76 years old.  There are no roaches in the house and bed is 2 feet off the floor.  He is not exposed to fumes, chemicals or dust.  There is no HEPA filter in the home and [place] not [place] an [place]. Smoking: Prior smoker from 1970 1981.  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   Objective: BP 122/78 (BP Location: Right Arm, Patient Position: Sitting, Cuff Size: Large)   Pulse 81   Temp (!) 97.3 F (36.3 C) (Temporal)   Resp 18   Ht 6' (1.829 m)   Wt 284 lb 8 oz (129 kg)   SpO2 96%   BMI 38.59 kg/m  Body mass index is 38.59 kg/m.  General Appearance:  Alert, cooperative, no distress, appears stated age  Head:  Normocephalic, without obvious abnormality, atraumatic   Eyes:  Conjunctiva clear, EOM's intact  Nose: Nares normal,   Throat: Lips, tongue normal; teeth and gums normal,   Neck: Supple, symmetrical  Lungs:   clear to auscultation bilaterally, Respirations unlabored, no coughing  Heart:  regular rate and rhythm and no murmur, Appears well perfused  Extremities: No edema  Skin: Skin color, texture, turgor normal,  erythematous plaque on right trunk with serpiginous borders and central ecchymoses  Neurologic: No gross deficits   The plan was reviewed with the patient/family, and all questions/concerned were addressed.  It was my pleasure to see Todd Harrison today and participate in his care. Please feel free to contact me with any questions or concerns.  Sincerely,  Roney Marion, MD Allergy & Immunology  [facility] office: [phone] [facility] office: [phone]

## 2022-04-12 ENCOUNTER — Encounter: Payer: Self-pay | Admitting: Internal Medicine

## 2022-04-13 ENCOUNTER — Encounter (HOSPITAL_BASED_OUTPATIENT_CLINIC_OR_DEPARTMENT_OTHER): Payer: Self-pay | Admitting: Emergency Medicine

## 2022-04-13 ENCOUNTER — Other Ambulatory Visit (HOSPITAL_BASED_OUTPATIENT_CLINIC_OR_DEPARTMENT_OTHER): Payer: Self-pay

## 2022-04-13 ENCOUNTER — Emergency Department (HOSPITAL_BASED_OUTPATIENT_CLINIC_OR_DEPARTMENT_OTHER): Payer: Medicare HMO

## 2022-04-13 ENCOUNTER — Other Ambulatory Visit: Payer: Self-pay

## 2022-04-13 ENCOUNTER — Emergency Department (HOSPITAL_BASED_OUTPATIENT_CLINIC_OR_DEPARTMENT_OTHER)
Admission: EM | Admit: 2022-04-13 | Discharge: 2022-04-13 | Disposition: A | Discharge status: Home / Self Care | Payer: Medicare HMO | Attending: Emergency Medicine | Admitting: Emergency Medicine

## 2022-04-13 DIAGNOSIS — Z7901 Long term (current) use of anticoagulants: Secondary | ICD-10-CM | POA: Diagnosis not present

## 2022-04-13 DIAGNOSIS — Z79899 Other long term (current) drug therapy: Secondary | ICD-10-CM | POA: Insufficient documentation

## 2022-04-13 DIAGNOSIS — R188 Other ascites: Secondary | ICD-10-CM | POA: Diagnosis not present

## 2022-04-13 DIAGNOSIS — D649 Anemia, unspecified: Secondary | ICD-10-CM | POA: Diagnosis not present

## 2022-04-13 DIAGNOSIS — R1013 Epigastric pain: Secondary | ICD-10-CM | POA: Diagnosis present

## 2022-04-13 DIAGNOSIS — J90 Pleural effusion, not elsewhere classified: Secondary | ICD-10-CM | POA: Diagnosis not present

## 2022-04-13 DIAGNOSIS — C800 Disseminated malignant neoplasm, unspecified: Secondary | ICD-10-CM | POA: Insufficient documentation

## 2022-04-13 DIAGNOSIS — R180 Malignant ascites: Secondary | ICD-10-CM

## 2022-04-13 DIAGNOSIS — R110 Nausea: Secondary | ICD-10-CM

## 2022-04-13 HISTORY — DX: Unspecified atrial fibrillation: I48.91

## 2022-04-13 LAB — CBC WITH DIFFERENTIAL/PLATELET
Abs Immature Granulocytes: 0.02 10*3/uL (ref 0.00–0.07)
Basophils Absolute: 0.1 10*3/uL (ref 0.0–0.1)
Basophils Relative: 1 %
Eosinophils Absolute: 0.3 10*3/uL (ref 0.0–0.5)
Eosinophils Relative: 4 %
HCT: 38.4 % — ABNORMAL LOW (ref 39.0–52.0)
Hemoglobin: 12.4 g/dL — ABNORMAL LOW (ref 13.0–17.0)
Immature Granulocytes: 0 %
Lymphocytes Relative: 16 %
Lymphs Abs: 1.3 10*3/uL (ref 0.7–4.0)
MCH: 28 pg (ref 26.0–34.0)
MCHC: 32.3 g/dL (ref 30.0–36.0)
MCV: 86.7 fL (ref 80.0–100.0)
Monocytes Absolute: 0.6 10*3/uL (ref 0.1–1.0)
Monocytes Relative: 7 %
Neutro Abs: 5.7 10*3/uL (ref 1.7–7.7)
Neutrophils Relative %: 72 %
Platelets: 230 10*3/uL (ref 150–400)
RBC: 4.43 MIL/uL (ref 4.22–5.81)
RDW: 14.6 % (ref 11.5–15.5)
WBC: 8 10*3/uL (ref 4.0–10.5)
nRBC: 0 % (ref 0.0–0.2)

## 2022-04-13 LAB — COMPREHENSIVE METABOLIC PANEL
ALT: 10 U/L (ref 0–44)
AST: 16 U/L (ref 15–41)
Albumin: 3.4 g/dL — ABNORMAL LOW (ref 3.5–5.0)
Alkaline Phosphatase: 76 U/L (ref 38–126)
Anion gap: 11 (ref 5–15)
BUN: 12 mg/dL (ref 8–23)
CO2: 26 mmol/L (ref 22–32)
Calcium: 9.1 mg/dL (ref 8.9–10.3)
Chloride: 104 mmol/L (ref 98–111)
Creatinine, Ser: 0.99 mg/dL (ref 0.61–1.24)
GFR, Estimated: >60 mL/min (ref >60)
Glucose, Bld: 95 mg/dL (ref 70–99)
Potassium: 4 mmol/L (ref 3.5–5.1)
Sodium: 141 mmol/L (ref 135–145)
Total Bilirubin: 0.5 mg/dL (ref 0.3–1.2)
Total Protein: 6.1 g/dL — ABNORMAL LOW (ref 6.5–8.1)

## 2022-04-13 LAB — LIPASE, BLOOD: Lipase: 26 U/L (ref 11–51)

## 2022-04-13 MED ORDER — SODIUM CHLORIDE 0.9 % IV SOLN: INTRAVENOUS | Status: DC

## 2022-04-13 MED ORDER — SODIUM CHLORIDE 0.9 % IV BOLUS
500.0000 mL | Freq: Once | INTRAVENOUS | Status: AC
  Administered 2022-04-13: 500 mL via INTRAVENOUS

## 2022-04-13 MED ORDER — ONDANSETRON 4 MG PO TBDP
4.0000 mg | ORAL_TABLET | Freq: Three times a day (TID) | ORAL | 2 refills | Status: DC | PRN
  Filled 2022-04-13: qty 20, 7d supply, fill #0

## 2022-04-13 MED ORDER — IOHEXOL 300 MG/ML  SOLN
100.0000 mL | Freq: Once | INTRAMUSCULAR | Status: AC | PRN
  Administered 2022-04-13: 100 mL via INTRAVENOUS

## 2022-04-13 MED ORDER — ONDANSETRON HCL 4 MG/2ML IJ SOLN
4.0000 mg | Freq: Once | INTRAMUSCULAR | Status: AC
  Administered 2022-04-13: 4 mg via INTRAVENOUS
  Filled 2022-04-13 (×2): qty 2

## 2022-04-13 MED ORDER — HYDROMORPHONE HCL 1 MG/ML IJ SOLN
1.0000 mg | Freq: Once | INTRAMUSCULAR | Status: AC
  Administered 2022-04-13: 1 mg via INTRAVENOUS
  Filled 2022-04-13 (×2): qty 1

## 2022-04-13 NOTE — Discharge Instructions (Signed)
You should be getting contacted by oncology cancer center at [facility].  But in case you do not hear anything by the beginning of next week contact them.  Also contact Dr. Michail Sermon from gastroenterology to see if they can arrange colonoscopy for workup of the CT findings.  Recommend may be some Ensure work on fluids first and bland diet.  Prescription for Zofran with refills provided.  Return for any new or worse symptoms.

## 2022-04-13 NOTE — ED Triage Notes (Signed)
Stomach is bloated, vomiting black/dark brown,nauseated. Blackness started yesterday, the bloating/nausea for about a month intermittent. Saw his primary yesterday and told to do an ULtrasound, they havent set that up yet.

## 2022-04-13 NOTE — ED Provider Notes (Addendum)
[facility] EMERGENCY DEPT Provider Note   CSN: [id number] Arrival date & time: 04/13/22  0747     History  Chief Complaint  Patient presents with   Abdominal Cramping    Todd Harrison is a 76 y.o. male.  Patient with a complaint of abdominal pain started all over.  But more epigastric associated with vomiting few hours after eating.  First episode was right prior to Thanksgiving.  And then shortly after Thanksgiving it started happening more frequently.  Has vomited some coffee-ground type emesis.  But no blood.  No prior history of anything similar.  Patient feels as if his abdomen is very bloated.  Does not get pain immediately with eating.  Seen by primary care doctor yesterday they were planning on ultrasound but has not occurred yet.       Home Medications Prior to Admission medications   Medication Sig Start Date End Date Taking? Authorizing Provider  B Complex Vitamins (VITAMIN B COMPLEX PO) Take 1 tablet by mouth every other day.    Yes [provider]  Cholecalciferol (VITAMIN D3 PO) Take 1 tablet by mouth daily.   Yes [provider]  clobetasol ointment (TEMOVATE) 0.05 % APPLY TO AFFECTED AREA TWICE A DAY 08/18/20  Yes [provider]  ELIQUIS 5 MG TABS tablet TAKE 1 TABLET BY MOUTH TWICE A DAY 02/22/22  Yes Vickie Epley, MD  famotidine (PEPCID) 20 MG tablet Take 20 mg by mouth 2 (two) times daily.   Yes [provider]  hydroxychloroquine (PLAQUENIL) 200 MG tablet Take 200 mg by mouth 2 (two) times daily. 06/17/21  Yes [provider]  ISOtretinoin (ACCUTANE) 40 MG capsule Take 40 mg by mouth 2 (two) times daily.  01/13/20  Yes [provider]  levothyroxine (SYNTHROID, LEVOTHROID) 100 MCG tablet Take 112 mcg by mouth daily before breakfast. 05/06/11  Yes [provider]  loratadine (CLARITIN) 10 MG tablet Take 10 mg by mouth daily.    Yes [provider]  metoprolol succinate  (TOPROL-XL) 50 MG 24 hr tablet Take 1 tablet (50 mg total) by mouth daily. TAKE WITH OR IMMEDIATELY FOLLOWING A MEAL. 03/23/22  Yes Vickie Epley, MD  montelukast (SINGULAIR) 10 MG tablet Take 1 tablet (10 mg total) by mouth at bedtime. 09/06/21  Yes Icard, Octavio Graves, DO  omeprazole (PRILOSEC) 40 MG capsule Take 40 mg by mouth daily. 04/12/22  Yes [provider]  ondansetron (ZOFRAN-ODT) 4 MG disintegrating tablet Take 1 tablet (4 mg total) by mouth every 8 (eight) hours as needed for nausea or vomiting. 04/13/22  Yes Fredia Sorrow, MD  rosuvastatin (CRESTOR) 10 MG tablet TAKE 1 TABLET EVERY DAY 03/23/22  Yes Vickie Epley, MD  triamcinolone (KENALOG) 0.1 % Apply 1 application topically daily as needed (lichen planus flare).   Yes [provider]  Turmeric 500 MG CAPS Take by mouth. Take as directed   Yes [provider]      Allergies    Gold and Mixed grasses    Review of Systems   Review of Systems  Constitutional:  Negative for chills and fever.  HENT:  Negative for ear pain and sore throat.   Eyes:  Negative for pain and visual disturbance.  Respiratory:  Negative for cough and shortness of breath.   Cardiovascular:  Negative for chest pain and palpitations.  Gastrointestinal:  Positive for abdominal pain, constipation, nausea and vomiting.  Genitourinary:  Negative for dysuria and hematuria.  Musculoskeletal:  Negative for arthralgias and back pain.  Skin:  Negative for color change and rash.  Neurological:  Negative for seizures and syncope.  All other systems reviewed and are negative.   Physical Exam Updated Vital Signs BP 115/73   Pulse 68   Temp 98.5 F (36.9 C) (Oral)   Resp (!) 23   Ht 1.829 m (6')   Wt 128.6 kg   SpO2 95%   BMI 38.45 kg/m  Physical Exam Vitals and nursing note reviewed.  Constitutional:      General: He is not in acute distress.    Appearance: Normal appearance. He is well-developed.  HENT:     Head:  Normocephalic and atraumatic.  Eyes:     Extraocular Movements: Extraocular movements intact.     Conjunctiva/sclera: Conjunctivae normal.     Pupils: Pupils are equal, round, and reactive to light.  Cardiovascular:     Rate and Rhythm: Normal rate and regular rhythm.     Heart sounds: No murmur heard. Pulmonary:     Effort: Pulmonary effort is normal. No respiratory distress.     Breath sounds: Normal breath sounds.  Abdominal:     Palpations: Abdomen is soft. There is no mass.     Tenderness: There is no abdominal tenderness. There is no guarding.  Musculoskeletal:        General: No swelling.     Cervical back: Normal range of motion and neck supple.  Skin:    General: Skin is warm and dry.     Capillary Refill: Capillary refill takes less than 2 seconds.  Neurological:     General: No focal deficit present.     Mental Status: He is alert and oriented to person, place, and time.     Cranial Nerves: No cranial nerve deficit.     Sensory: No sensory deficit.     Motor: No weakness.  Psychiatric:        Mood and Affect: Mood normal.     ED Results / Procedures / Treatments   Labs (all labs ordered are listed, but only abnormal results are displayed) Labs Reviewed  COMPREHENSIVE METABOLIC PANEL - Abnormal; Notable for the following components:      Result Value   Total Protein 6.1 (*)    Albumin 3.4 (*)    All other components within normal limits  CBC WITH DIFFERENTIAL/PLATELET - Abnormal; Notable for the following components:   Hemoglobin 12.4 (*)    HCT 38.4 (*)    All other components within normal limits  LIPASE, BLOOD  CEA    EKG None  Radiology CT Abdomen Pelvis W Contrast  Result Date: 04/13/2022 CLINICAL DATA:  Abdominal pain, acute, nonlocalized. Epigastric pain and vomiting after eating for 2 weeks. EXAM: CT ABDOMEN AND PELVIS WITH CONTRAST TECHNIQUE: Multidetector CT imaging of the abdomen and pelvis was performed using the standard protocol following  bolus administration of intravenous contrast. RADIATION DOSE REDUCTION: This exam was performed according to the departmental dose-optimization program which includes automated exposure control, adjustment of the mA and/or kV according to patient size and/or use of iterative reconstruction technique. CONTRAST:  168m OMNIPAQUE IOHEXOL 300 MG/ML  SOLN COMPARISON:  Limited correlation made with noncontrast chest CT 08/11/2020. FINDINGS: Lower chest: New small left greater than right pleural effusions with associated dependent atelectasis in both lower lobes. No suspicious pulmonary nodularity. Aortic and coronary artery atherosclerosis with calcifications of the aortic valve. Hepatobiliary: The liver is normal in density without suspicious focal abnormality. Probable incidental  venous malformation involving the right hepatic and portal veins. No evidence of gallstones, gallbladder wall thickening or biliary dilatation. Pancreas: Unremarkable. No pancreatic ductal dilatation or surrounding inflammatory changes. Spleen: Normal in size without focal abnormality. Adrenals/Urinary Tract: Both adrenal glands appear normal. No evidence of urinary tract calculus, suspicious renal lesion or hydronephrosis. The bladder appears unremarkable for its degree of distention. Stomach/Bowel: No enteric contrast administered. The stomach appears unremarkable for its degree of distention. The small bowel is decompressed without apparent wall thickening or surrounding inflammation. There is a possible nearly circumferential mass involving the distal transverse colon (image 36/2). No evidence of bowel obstruction or perforation. There are diverticular changes in the sigmoid colon. Vascular/Lymphatic: No discretely enlarged abdominopelvic lymph nodes are identified. There is aortic and branch vessel atherosclerosis without evidence of aneurysm or large vessel occlusion. The portal, superior mesenteric and splenic veins are patent.  Reproductive: The prostate gland and seminal vesicles appear normal. Other: Moderate amount of ascites with diffuse omental and peritoneal nodularity highly worrisome for peritoneal carcinomatosis. Possible 1.8 cm soft tissue mass between the bladder and sigmoid colon on image 73/2. No pneumoperitoneum. Musculoskeletal: No acute or significant osseous findings. IMPRESSION: 1. Moderate amount of ascites with diffuse omental and peritoneal nodularity highly worrisome for peritoneal carcinomatosis. Recommend diagnostic paracentesis. 2. Possible circumferential mass involving the transverse colon which could reflect a primary colonic malignancy. No other primary malignancy identified in the abdomen or pelvis. No evidence of bowel obstruction or perforation. 3. New small left greater than right pleural effusions with associated dependent atelectasis in both lower lobes. No suspicious pulmonary nodularity identified. 4. Probable incidental venous malformation involving the right hepatic and portal veins. 5.  Aortic Atherosclerosis (ICD10-I70.0). Electronically Signed   By: Richardean Sale M.D.   On: 04/13/2022 12:14    Procedures Procedures    Medications Ordered in ED Medications  0.9 %  sodium chloride infusion ( Intravenous New Bag/Given 04/13/22 1035)  ondansetron (ZOFRAN) injection 4 mg (4 mg Intravenous Given 04/13/22 1049)  HYDROmorphone (DILAUDID) injection 1 mg (1 mg Intravenous Given 04/13/22 1049)  sodium chloride 0.9 % bolus 500 mL (0 mLs Intravenous Stopped 04/13/22 1035)  iohexol (OMNIPAQUE) 300 MG/ML solution 100 mL (100 mLs Intravenous Contrast Given 04/13/22 1117)    ED Course/ Medical Decision Making/ A&P                           Medical Decision Making Amount and/or Complexity of Data Reviewed Labs: ordered. Radiology: ordered.  Risk Prescription drug management.   Patient's abdomen is distended somewhat but is not firm.  No tenderness to palpation.  Will start with CT scan  abdomen.  And see what results we get.  Then may go on to do right upper quadrant ultrasound.  Patient's white blood cell count very normal at 8.  Hemoglobin 12.4 little bit down but platelets are normal as well complete metabolic panel and lipase is pending.  Will go ahead and give IV fluids.  Offered him pain medicine and a nausea medicine.  But patient refused.  But will get the IV fluids.   Labs without significant abnormalities.  Liver function test are normal.  Lipase normal.  CEA in process based on CT findings.  CBC white blood cell count 8 hemoglobin down a little bit at 12.4.  Platelets normal at 230.  CT abdomen and pelvis had fairly significant findings.  This seems to be a primary tumor may be transverse colon  and there is carcinomatosis throughout the peritoneal area and there is ascites and some small pleural effusions left greater than right.  Patient without any hypoxia.  Patient's abdomen was nontender.  Discussed with on-call hematology oncology Dr. Irene Limbo feels that patient can have an outpatient workup.  They will contact him but patient's family also given the number to contact them.   Patient's family will also contact Dr. Michail Sermon from gastroenterology for outpatient colonoscopy hopefully sooner than later.  Recommending a Zofran to help with the nausea and the vomiting.  Patient doing very well on the Zofran here.  Also gave diet recommendations to help.    Final Clinical Impression(s) / ED Diagnoses Final diagnoses:  Pleural effusion  Malignant ascites  Carcinomatosis ([place])    Rx / DC Orders ED Discharge Orders          Ordered    ondansetron (ZOFRAN-ODT) 4 MG disintegrating tablet  Every 8 hours PRN        04/13/22 1608              Fredia Sorrow, MD 04/13/22 1027    Fredia Sorrow, MD 04/13/22 1611

## 2022-04-13 NOTE — ED Triage Notes (Signed)
Pt has hx of recent afib and is on eliquis

## 2022-04-13 NOTE — Telephone Encounter (Signed)
Thanks for the update!  We are still waiting for some labs to come back but so far they are concerning for urticarial vasculitis.  We definitely need a skin biopsy to confirm.  I do not think gall bladder symptoms would effect your labs!

## 2022-04-13 NOTE — ED Triage Notes (Signed)
Pt was on prednisone for rash (2 weeks). Rash has been biopsied no results yet, derm told him this was autoimmune.

## 2022-04-13 NOTE — ED Notes (Signed)
Discharge paperwork given and verbally understood. 

## 2022-04-14 ENCOUNTER — Inpatient Hospital Stay: Payer: Medicare HMO | Attending: Oncology | Admitting: Oncology

## 2022-04-14 ENCOUNTER — Telehealth: Payer: Self-pay | Admitting: Internal Medicine

## 2022-04-14 ENCOUNTER — Other Ambulatory Visit: Payer: Self-pay | Admitting: *Deleted

## 2022-04-14 ENCOUNTER — Encounter: Payer: Self-pay | Admitting: *Deleted

## 2022-04-14 VITALS — BP 108/76 | HR 78 | Temp 98.7°F | Resp 18 | Wt 287.0 lb

## 2022-04-14 DIAGNOSIS — R112 Nausea with vomiting, unspecified: Secondary | ICD-10-CM | POA: Insufficient documentation

## 2022-04-14 DIAGNOSIS — R188 Other ascites: Secondary | ICD-10-CM | POA: Diagnosis not present

## 2022-04-14 DIAGNOSIS — I4891 Unspecified atrial fibrillation: Secondary | ICD-10-CM | POA: Insufficient documentation

## 2022-04-14 DIAGNOSIS — L439 Lichen planus, unspecified: Secondary | ICD-10-CM | POA: Insufficient documentation

## 2022-04-14 DIAGNOSIS — R9389 Abnormal findings on diagnostic imaging of other specified body structures: Secondary | ICD-10-CM

## 2022-04-14 DIAGNOSIS — Z5111 Encounter for antineoplastic chemotherapy: Secondary | ICD-10-CM | POA: Insufficient documentation

## 2022-04-14 DIAGNOSIS — E039 Hypothyroidism, unspecified: Secondary | ICD-10-CM | POA: Insufficient documentation

## 2022-04-14 DIAGNOSIS — C482 Malignant neoplasm of peritoneum, unspecified: Secondary | ICD-10-CM | POA: Diagnosis not present

## 2022-04-14 DIAGNOSIS — Z87891 Personal history of nicotine dependence: Secondary | ICD-10-CM | POA: Insufficient documentation

## 2022-04-14 DIAGNOSIS — C786 Secondary malignant neoplasm of retroperitoneum and peritoneum: Secondary | ICD-10-CM | POA: Insufficient documentation

## 2022-04-14 DIAGNOSIS — R21 Rash and other nonspecific skin eruption: Secondary | ICD-10-CM | POA: Diagnosis not present

## 2022-04-14 DIAGNOSIS — Z808 Family history of malignant neoplasm of other organs or systems: Secondary | ICD-10-CM

## 2022-04-14 DIAGNOSIS — C800 Disseminated malignant neoplasm, unspecified: Secondary | ICD-10-CM

## 2022-04-14 DIAGNOSIS — J90 Pleural effusion, not elsewhere classified: Secondary | ICD-10-CM | POA: Insufficient documentation

## 2022-04-14 MED ORDER — TRAMADOL HCL 50 MG PO TABS: 50.0000 mg | ORAL_TABLET | Freq: Four times a day (QID) | ORAL | 0 refills | Status: DC | PRN

## 2022-04-14 MED ORDER — HYDROXYZINE HCL 10 MG PO TABS: 10.0000 mg | ORAL_TABLET | Freq: Three times a day (TID) | ORAL | 0 refills | Status: DC | PRN

## 2022-04-14 NOTE — Progress Notes (Signed)
[facility] New Patient Consult   Requesting MD: Fredia Sorrow, Md [address] [place],  [place] [address]   Todd Harrison 76 y.o.  [birth year]    Reason for Consult: Carcinomatosis   HPI: Mr. Route reports a history of intermittent nausea and vomiting for approximate the past month.  These symptoms have progressed over the past few weeks.  Developed black emesis with the most recent episodes of vomiting.  He presented to emergency room with increased abdominal pain on 04/13/2022.  A CT of the abdomen and pelvis revealed new small left greater than right pleural effusions, possible circumferential mass in the distal transverse colon without evidence of bowel obstruction, no lymphadenopathy, moderate ascites with diffuse omental and peritoneal nodularity.  There is a possible 1.8 cm soft tissue mass between the bladder and sigmoid colon.  He reports a rash over the trunk for the past 6-8 months.  The rash improves with prednisone.  Course of prednisone was completed prior to Thanksgiving and the rash has returned.  He has chronic lichen planus of the feet, improved with prednisone and stable while on hydroxychloroquine and Accutane.  Past Medical History:  Diagnosis Date   A-fib ([place])    Asthma    Lichen planus    Methotrexate, long term, current use    no longer taking   Psoriasis    Thyroid disease    Urticaria     .  Hypothyroidism  Past Surgical History:  Procedure Laterality Date   CARDIOVERSION N/A 02/23/2020   Procedure: CARDIOVERSION;  Surgeon: Werner Lean, MD;  Location: [facility];  Service: Cardiovascular;  Laterality: N/A;   CARDIOVERSION N/A 04/12/2020   Procedure: CARDIOVERSION;  Surgeon: Buford Dresser, MD;  Location: [facility];  Service: Cardiovascular;  Laterality: N/A;   CATARACT EXTRACTION Left    2019   TONSILLECTOMY      Medications: Reviewed  Allergies:  Allergies  Allergen Reactions   Gold Itching    Mixed Grasses     unknown    Family history: His maternal great grandmother had a "blood cancer ".  Social History:   He lives with his wife in [place].  He works in Physiological scientist.  He quit cigarettes 41 years ago.  Social alcohol use.  No transfusion history.  No risk factor for HIV or hepatitis.  ROS:   Positives include: Rash over the stomach, back, and arms for the past 6-8 months,-improved with prednisone, lichen planus over the feet, nausea/vomiting and abdominal distention for the past month, constipation for 5-6 weeks, distended abdomen, anorexia and 25 pound weight loss  A complete ROS was otherwise negative.  Physical Exam:  Blood pressure 108/76, pulse 78, temperature 98.7 F (37.1 C), temperature source Oral, resp. rate 18, weight 287 lb (130.2 kg), SpO2 96 %.  HEENT: Oropharynx without visible mass, neck without mass Lungs: Decreased breath sounds at the lower chest bilaterally, no respiratory distress Cardiac: Rate and rhythm Abdomen: Distended, no mass, no hepatosplenomegaly GU: Stays without mass Vascular: No leg edema Lymph nodes: No cervical, supraclavicular, axillary, or inguinal nodes Neurologic: Third and oriented, the motor exam appears intact in the upper and lower extremities bilaterally Skin: Confluent erythematous rash over the right chest and back, erythema and callus formation at the soles Musculoskeletal: No spine tenderness   LAB:  CBC  Lab Results  Component Value Date   WBC 8.0 04/13/2022   HGB 12.4 (L) 04/13/2022   HCT 38.4 (L) 04/13/2022   MCV  86.7 04/13/2022   PLT 230 04/13/2022   NEUTROABS 5.7 04/13/2022        CMP  Lab Results  Component Value Date   NA 141 04/13/2022   K 4.0 04/13/2022   CL 104 04/13/2022   CO2 26 04/13/2022   GLUCOSE 95 04/13/2022   BUN 12 04/13/2022   CREATININE 0.99 04/13/2022   CALCIUM 9.1 04/13/2022   PROT 6.1 (L) 04/13/2022   ALBUMIN 3.4 (L) 04/13/2022   AST 16 04/13/2022    ALT 10 04/13/2022   ALKPHOS 76 04/13/2022   BILITOT 0.5 04/13/2022   GFRNONAA >60 04/13/2022   GFRAA 74 05/20/2020     Imaging:  CT Abdomen Pelvis W Contrast  Result Date: 04/13/2022 CLINICAL DATA:  Abdominal pain, acute, nonlocalized. Epigastric pain and vomiting after eating for 2 weeks. EXAM: CT ABDOMEN AND PELVIS WITH CONTRAST TECHNIQUE: Multidetector CT imaging of the abdomen and pelvis was performed using the standard protocol following bolus administration of intravenous contrast. RADIATION DOSE REDUCTION: This exam was performed according to the departmental dose-optimization program which includes automated exposure control, adjustment of the mA and/or kV according to patient size and/or use of iterative reconstruction technique. CONTRAST:  157m OMNIPAQUE IOHEXOL 300 MG/ML  SOLN COMPARISON:  Limited correlation made with noncontrast chest CT 08/11/2020. FINDINGS: Lower chest: New small left greater than right pleural effusions with associated dependent atelectasis in both lower lobes. No suspicious pulmonary nodularity. Aortic and coronary artery atherosclerosis with calcifications of the aortic valve. Hepatobiliary: The liver is normal in density without suspicious focal abnormality. Probable incidental venous malformation involving the right hepatic and portal veins. No evidence of gallstones, gallbladder wall thickening or biliary dilatation. Pancreas: Unremarkable. No pancreatic ductal dilatation or surrounding inflammatory changes. Spleen: Normal in size without focal abnormality. Adrenals/Urinary Tract: Both adrenal glands appear normal. No evidence of urinary tract calculus, suspicious renal lesion or hydronephrosis. The bladder appears unremarkable for its degree of distention. Stomach/Bowel: No enteric contrast administered. The stomach appears unremarkable for its degree of distention. The small bowel is decompressed without apparent wall thickening or surrounding inflammation. There  is a possible nearly circumferential mass involving the distal transverse colon (image 36/2). No evidence of bowel obstruction or perforation. There are diverticular changes in the sigmoid colon. Vascular/Lymphatic: No discretely enlarged abdominopelvic lymph nodes are identified. There is aortic and branch vessel atherosclerosis without evidence of aneurysm or large vessel occlusion. The portal, superior mesenteric and splenic veins are patent. Reproductive: The prostate gland and seminal vesicles appear normal. Other: Moderate amount of ascites with diffuse omental and peritoneal nodularity highly worrisome for peritoneal carcinomatosis. Possible 1.8 cm soft tissue mass between the bladder and sigmoid colon on image 73/2. No pneumoperitoneum. Musculoskeletal: No acute or significant osseous findings. IMPRESSION: 1. Moderate amount of ascites with diffuse omental and peritoneal nodularity highly worrisome for peritoneal carcinomatosis. Recommend diagnostic paracentesis. 2. Possible circumferential mass involving the transverse colon which could reflect a primary colonic malignancy. No other primary malignancy identified in the abdomen or pelvis. No evidence of bowel obstruction or perforation. 3. New small left greater than right pleural effusions with associated dependent atelectasis in both lower lobes. No suspicious pulmonary nodularity identified. 4. Probable incidental venous malformation involving the right hepatic and portal veins. 5.  Aortic Atherosclerosis (ICD10-I70.0). Electronically Signed   By: WRichardean SaleM.D.   On: 04/13/2022 12:14      Assessment/Plan:   CT evidence of abdominal carcinomatosis CT abdomen/pelvis 04/13/2022-ascites, diffuse omental and peritoneal nodularity, possible circumferential mass in  the transverse colon, new small left greater than right pleural effusions Truncal rash-status post recent punch biopsy with pathology pending Lichen planus of the  feet Hypothyroidism Atrial fibrillation   Disposition:   Mr. Gallicchio presents with nausea/vomiting, constipation, and abdominal distention.  A CT yesterday reveals evidence of carcinomatosis and a possible transverse colon mass.  I reviewed the CT images and findings with Mr. Conaway and his family.  We discussed the differential diagnosis including colon cancer and other gastrointestinal malignancies.  He will be referred for a colonoscopy and EGD.  We will refer him to interventional radiology for biopsy of an omental nodule and paracentesis.  He will begin a liquid diet.  I suspect he is having intermittent obstruction from the carcinomatosis.  We will prescribe tramadol for pain.  He will try hydroxyzine for pruritus associated with the truncal rash.  Mr. Weed will return for an office visit and further discussion on 04/26/2022.  He will contact us in the interim as needed.  Betsy Coder, MD  04/14/2022, 3:35 PM

## 2022-04-14 NOTE — Telephone Encounter (Signed)
Patient with CT suggesting colon cancer with metastases and will need colonoscopy, possible EGD Please put him in for appt me 250 PM Tues 12/12   Please go ahead and put him in  - he is currently at cancer center in an appointment  I will get him the information about when to show up etc  I

## 2022-04-14 NOTE — Telephone Encounter (Signed)
Pt was scheduled for an office visit with Dr. Carlean Purl on 04/18/2022 at 2:50 PM.

## 2022-04-14 NOTE — Progress Notes (Signed)
PATIENT NAVIGATOR PROGRESS NOTE  Name: Todd Harrison Date: 04/14/2022 MRN: [id number]  DOB: Jul 12, 1945   Reason for visit:  Introductory phone call  Comments:  Spoke with patient and scheduled with Dr Benay Spice today at 1:40 reviewed directions to building and parking as well as one support person allowed in the appt with him. Verbalized understanding    Time spent counseling/coordinating care: 45-60 minutes

## 2022-04-14 NOTE — Progress Notes (Signed)
PATIENT NAVIGATOR PROGRESS NOTE  Name: ATHOL BOLDS Date: 04/14/2022 MRN: [id number]  DOB: Feb 21, 1946   Reason for visit:  New patient appt  Comments:  Met with Mr and Mrs Longshore and son Claiborne Billings and daughter Suzy Bouchard during visit with Dr Benay Spice Referral made to IR for biopsy of omentum or peritoneal nodule for diagnostic purposes To see Dr Carlean Purl on 12/12 Return to see Dr Benay Spice on 12/20 Instructed to follow a Full Liquid diet to avoid obstruction. Daughter Suzy Bouchard is dietician and will assist him with this as well as bowel regimen of Miralax and colace. Verbalized understanding Given contact information to call with any issues or questions 6. Referral to Social work for new diagnosis   Time spent counseling/coordinating care: > 60 minutes

## 2022-04-14 NOTE — Progress Notes (Signed)
Todd Harrison is accompanied to appointment today with his daughter present and other family members (including wife) are in lobby on the phone.

## 2022-04-15 LAB — CEA: CEA: <0.6 ng/mL (ref 0.0–4.7)

## 2022-04-15 LAB — ANCA PROFILE (RDL)
ANCA by IFA (RDL): NEGATIVE
Anti-MPO Ab (RDL): <20 Units (ref <20)
Anti-PR-3 Ab (RDL): <20 Units (ref <20)

## 2022-04-17 ENCOUNTER — Other Ambulatory Visit: Payer: Self-pay | Admitting: *Deleted

## 2022-04-17 ENCOUNTER — Other Ambulatory Visit: Payer: Self-pay | Admitting: Cardiology

## 2022-04-17 ENCOUNTER — Encounter: Payer: Self-pay | Admitting: General Practice

## 2022-04-17 ENCOUNTER — Telehealth: Payer: Self-pay

## 2022-04-17 ENCOUNTER — Inpatient Hospital Stay: Payer: Medicare HMO

## 2022-04-17 DIAGNOSIS — C800 Disseminated malignant neoplasm, unspecified: Secondary | ICD-10-CM

## 2022-04-17 DIAGNOSIS — I700 Atherosclerosis of aorta: Secondary | ICD-10-CM | POA: Insufficient documentation

## 2022-04-17 MED ORDER — ONDANSETRON HCL 8 MG PO TABS: 8.0000 mg | ORAL_TABLET | Freq: Three times a day (TID) | ORAL | 1 refills | Status: DC | PRN

## 2022-04-17 NOTE — Telephone Encounter (Signed)
   Patient Name: Todd Harrison  DOB: [birth year] MRN: [id number]  Primary Cardiologist: Freada Bergeron, MD  Chart reviewed as part of pre-operative protocol coverage. Pharmacy clearance only. Per office protocols and pharmacist review , NILAN IDDINGS may hold Eliquis 2 days prior to planned procedure.   I will route this recommendation to the requesting party via Epic fax function and remove from pre-op pool.  Please call with questions.  Loel Dubonnet, NP 04/17/2022, 1:20 PM

## 2022-04-17 NOTE — Progress Notes (Signed)
[facility]  Initial Assessment   WEBB WEED is a 76 y.o. year old male contacted by phone. Clinical Social Work was referred by new patient protocol for assessment of psychosocial needs.   SDOH (Social Determinants of Health) assessments performed: Yes SDOH Interventions    Flowsheet Row Clinical Support from 04/17/2022 in [facility]  SDOH Interventions   Food Insecurity Interventions Intervention Not Indicated  Housing Interventions Intervention Not Indicated  Transportation Interventions Intervention Not Indicated  Utilities Interventions Intervention Not Indicated  Financial Strain Interventions Intervention Not Indicated  Stress Interventions Intervention Not Indicated       SDOH Screenings   Food Insecurity: No Food Insecurity (04/17/2022)  Housing: Low Risk  (04/17/2022)  Transportation Needs: No Transportation Needs (04/17/2022)  Utilities: Not At Risk (04/17/2022)  Depression (PHQ2-9): Low Risk  (11/21/2019)  Financial Resource Strain: Low Risk  (04/17/2022)  Stress: No Stress Concern Present (04/17/2022)  Tobacco Use: Medium Risk (04/13/2022)     Distress Screen completed: No    04/14/2022    2:20 PM  ONCBCN DISTRESS SCREENING  Screening Type Initial Screening  Distress experienced in past week (1-10) 5  Information Concerns Type Lack of info about diagnosis;Lack of info about treatment  Physical Problem type Nausea/vomiting;Loss of appetitie;Constipation/diarrhea;Skin dry/itchy  Physician notified of physical symptoms Yes  Referral to clinical psychology No  Referral to clinical social work No  Referral to dietition Yes  Referral to financial advocate No  Referral to support programs No  Referral to palliative care No      Family/Social Information:  Housing Arrangement: patient lives with his wife, Linton Flemings. Family members/support persons in your life? Family and Friends Transportation concerns: no   Employment: Physiological scientist. Financial concerns: No Type of concern: None Food access concerns: no Religious or spiritual practice: Not known Services Currently in place:  [employer]  Coping/ Adjustment to diagnosis: Patient understands treatment plan and what happens next? He feels there are some uncertainties about his illness.  He is taking things a day at a time. Concerns about diagnosis and/or treatment:  He doesn't know what to be concerned about yet. Patient reported stressors: Adjusting to my illness Hopes and/or priorities: "I've done everything I wanted to do." Patient enjoys time with family/ friends.  Both of his children work for [employer]. Current coping skills/ strengths: Armed forces logistics/support/administrative officer , Financial means , General fund of knowledge , and Supportive family/friends     SUMMARY: Current SDOH Barriers:  None identified by patient.  Clinical Social Work Clinical Goal(s):  Patient will work with SW to address concerns related to adjustment to illness.  Interventions: Discussed common feeling and emotions when being diagnosed with cancer, and the importance of support during treatment Informed patient of the support team roles and support services at [facility] Provided CSW contact information and encouraged patient to call with any questions or concerns Provided patient with information about CSW role.  Patient agreed to have CSW mail literature on the [facility], [place] [facility], and the [employer].   Follow Up Plan: Patient will contact CSW with any support or resource needs Patient verbalizes understanding of plan: Yes    Rodman Pickle Raine Elsass, LCSW

## 2022-04-17 NOTE — Telephone Encounter (Signed)
Todd Harrison please go ahead and put this patient in for a colonoscopy at 4 PM on 12/18 - I will review with him when I see him tomorrow  Want to have that spot for him

## 2022-04-17 NOTE — Telephone Encounter (Signed)
Will route to PharmD for rec's re: holding anticoagulation. Richardson Dopp, PA-C    04/17/2022 11:43 AM

## 2022-04-17 NOTE — Telephone Encounter (Deleted)
[facility] Pre-operative Risk Assessment     Request for surgical clearance:     Endoscopy Procedure  What type of surgery is being performed?     colonoscopy  When is this surgery scheduled?     04/25/2022  What type of clearance is required ?   Pharmacy  Are there any medications that need to be held prior to surgery and how long? Eliquis, 2 days  Practice name and name of physician performing surgery?      [facility]  What is your office phone and fax number?      Phone- [phone]  [phone]  Anesthesia type (None, local, MAC, general) ?       MAC

## 2022-04-17 NOTE — Telephone Encounter (Signed)
Patient has appointment 04/18/2022, typed up his instructions and sent Eliquis clearance letter today to save him time tomorrow.

## 2022-04-17 NOTE — Telephone Encounter (Signed)
Patient with diagnosis of afib on Eliquis for anticoagulation.    Procedure: colonoscopy Date of procedure: 04/25/22  CHA2DS2-VASc Score = 2  This indicates a 2.2% annual risk of stroke. The patient's score is based upon: CHF History: 0 HTN History: 0 Diabetes History: 0 Stroke History: 0 Vascular Disease History: 1 Age Score: 1 Gender Score: 0   Aortic atherosclerosis noted on abdominal CT 04/2022, PMH updated.  CrCl 77m/min using adjusted body weight Platelet count 230K  Per office protocol, patient can hold Eliquis for 2 days prior to procedure as requested.    **This guidance is not considered finalized until pre-operative APP has relayed final recommendations.**

## 2022-04-17 NOTE — Telephone Encounter (Signed)
[facility] Pre-operative Risk Assessment     Request for surgical clearance:     Endoscopy Procedure  What type of surgery is being performed?     colonoscopy  When is this surgery scheduled?     04/25/2022  What type of clearance is required ?   Pharmacy  Are there any medications that need to be held prior to surgery and how long? Eliquis, 2 days  Practice name and name of physician performing surgery?      [facility]  What is your office phone and fax number?      Phone- [phone]  [phone]  Anesthesia type (None, local, MAC, general) ?       MAC

## 2022-04-17 NOTE — Progress Notes (Signed)
Mir, Paula Libra, MD  Allen Kell, NT Approved for CT guided Omental biopsy. Maybe helpful to perform paracentesis first during the same appointment.  Mir       Previous Messages    ----- Message ----- From: Allen Kell, NT Sent: 04/14/2022   3:48 PM EST To: Ir Procedure Requests Subject: CT Biopsy                                      Procedure: CT Biopsy  Reason: Abnormal CT of the chest, Omentum biopsy for diagnosis  History: CT in chart  Provider: Ladell Pier, MD  Contact: [phone]

## 2022-04-17 NOTE — Telephone Encounter (Unsigned)
Pt was scheduled for 04/25/2022 at 4:00 PM. Pt to be made aware tomorrow at office visit:  Prep instructions to be reviewed with Pt tomorrow at office visit:

## 2022-04-18 ENCOUNTER — Ambulatory Visit: Payer: Medicare HMO | Admitting: Internal Medicine

## 2022-04-18 ENCOUNTER — Encounter: Payer: Self-pay | Admitting: Internal Medicine

## 2022-04-18 VITALS — BP 110/64 | HR 69 | Ht 72.0 in | Wt 283.0 lb

## 2022-04-18 DIAGNOSIS — C800 Disseminated malignant neoplasm, unspecified: Secondary | ICD-10-CM

## 2022-04-18 DIAGNOSIS — R933 Abnormal findings on diagnostic imaging of other parts of digestive tract: Secondary | ICD-10-CM

## 2022-04-18 NOTE — Patient Instructions (Signed)
You have been scheduled for an endoscopy and colonoscopy. Please follow the written instructions given to you at your visit today. Please pick up your prep supplies at the pharmacy within the next 1-3 days. If you use inhalers (even only as needed), please bring them with you on the day of your procedure.   Hold your Eliquis starting Saturday December 16th 2023.   I appreciate the opportunity to care for you. Silvano Rusk, MD

## 2022-04-18 NOTE — Progress Notes (Signed)
Todd Harrison 76 y.o. 12/10/1945 [id number]  Assessment & Plan:   Encounter Diagnoses  Name Primary?   Carcinomatosis ([place]) Yes   Abnormal CT scan, colon     CT scan raises question of colon cancer based upon the findings and transverse colon.  In my experience it is unusual for colon cancer to cause ascites though it is possible.  Gastric cancer is in the differential as are diseases that could be metastatic to the abdomen.  Agree with plans for paracentesis and omental biopsy 12/18.  Colonoscopy and EGD are scheduled for December 19.  He will be holding his Eliquis and will continue to hold until colonoscopy is complete.  Paracentesis should provide some relief of this indigestion and regurgitation symptomatology that is likely from gastric compression.  The risks and benefits as well as alternatives of endoscopic procedure(s) have been discussed and reviewed. All questions answered. The patient agrees to proceed.   CC: Lorene Dy, MD Dr. Julieanne Manson  Subjective:   Chief Complaint: Carcinomatosis abnormal CT:  HPI 76 year old white man with a past medical history of psoriasis, atrial fibrillation on Eliquis who was recently found to have abdominal ascites, changes of carcinomatosis and an abnormal transverse colon?  On CT scan.  He has been having upset stomach and indigestion and regurgitation symptoms.  Some nausea and vomiting and constipation.  Dr. Benay Spice recommended a liquid diet and he has been on that and is improved though not experiencing resolution of symptoms.  He has had abdominal girth increase but loss of weight overall.  Wife and daughter Engineer, structural) are present and participate.  He has subsequently seen Dr. Benay Spice of oncology (office visit reviewed).  We do not have a diagnosis as of yet.  He is scheduled for paracentesis and omental biopsy with interventional radiology on December 18.  He has had some constipation and loss of appetite and early  satiety symptoms.  He reports having had a colonoscopy about 7 years ago or so that was negative.  Other problems including recent flare of skin rash, unclear etiology he has seen Dr. Elvera Lennox, biopsies were unrevealing.  He is currently on prednisone to treat the rash and it is benefiting him.  He has been tired somewhat.  Wt Readings from Last 3 Encounters:  04/18/22 283 lb (128.4 kg)  04/14/22 287 lb (130.2 kg)  04/13/22 283 lb 8 oz (128.6 kg)  303 pounds in February 2023   Lab Results  Component Value Date   WBC 8.0 04/13/2022   HGB 12.4 (L) 04/13/2022   HCT 38.4 (L) 04/13/2022   MCV 86.7 04/13/2022   PLT 230 04/13/2022     Chemistry      Component Value Date/Time   NA 141 04/13/2022 0859   NA 138 10/22/2020 0848   K 4.0 04/13/2022 0859   CL 104 04/13/2022 0859   CO2 26 04/13/2022 0859   BUN 12 04/13/2022 0859   BUN 16 10/22/2020 0848   CREATININE 0.99 04/13/2022 0859      Component Value Date/Time   CALCIUM 9.1 04/13/2022 0859   ALKPHOS 76 04/13/2022 0859   AST 16 04/13/2022 0859   ALT 10 04/13/2022 0859   BILITOT 0.5 04/13/2022 0859   BILITOT 0.5 10/22/2020 0848      CEA is less than .6  CT abdomen pelvis with contrast 04/13/2022-images reviewed  IMPRESSION: 1. Moderate amount of ascites with diffuse omental and peritoneal nodularity highly worrisome for peritoneal carcinomatosis. Recommend diagnostic paracentesis. 2. Possible circumferential mass  involving the transverse colon which could reflect a primary colonic malignancy. No other primary malignancy identified in the abdomen or pelvis. No evidence of bowel obstruction or perforation. 3. New small left greater than right pleural effusions with associated dependent atelectasis in both lower lobes. No suspicious pulmonary nodularity identified. 4. Probable incidental venous malformation involving the right hepatic and portal veins. 5.  Aortic Atherosclerosis (ICD10-I70.0).  Also shows a possible 1.8  cm soft tissue mass between the bladder and sigmoid colon Allergies  Allergen Reactions   Gold Itching   Mixed Grasses     unknown   Current Meds  Medication Sig   clobetasol ointment (TEMOVATE) 0.05 % APPLY TO AFFECTED AREA TWICE A DAY   ELIQUIS 5 MG TABS tablet TAKE 1 TABLET BY MOUTH TWICE A DAY   hydroxychloroquine (PLAQUENIL) 200 MG tablet Take 200 mg by mouth 2 (two) times daily.   hydrOXYzine (ATARAX) 10 MG tablet Take 1 tablet (10 mg total) by mouth 3 (three) times daily as needed.   ISOtretinoin (ACCUTANE) 40 MG capsule Take 40 mg by mouth 2 (two) times daily.    levothyroxine (SYNTHROID, LEVOTHROID) 100 MCG tablet Take 112 mcg by mouth daily before breakfast.   metoprolol succinate (TOPROL-XL) 50 MG 24 hr tablet TAKE 1 TABLET BY MOUTH DAILY. TAKE WITH OR IMMEDIATELY FOLLOWING A MEAL.   omeprazole (PRILOSEC) 40 MG capsule Take 40 mg by mouth daily.   ondansetron (ZOFRAN) 8 MG tablet Take 1 tablet (8 mg total) by mouth every 8 (eight) hours as needed for nausea or vomiting.   predniSONE (DELTASONE) 20 MG tablet Take 40 mg by mouth daily with breakfast. 40 mg for 1 week, 20 mg x 1 weel   traMADol (ULTRAM) 50 MG tablet Take 1 tablet (50 mg total) by mouth every 6 (six) hours as needed.   triamcinolone (KENALOG) 0.1 % Apply 1 application topically daily as needed (lichen planus flare).   Past Medical History:  Diagnosis Date   A-fib ([place])    Asthma    Lichen planus    Methotrexate, long term, current use    no longer taking   Psoriasis    Sleep apnea    Thyroid disease    Urticaria    Past Surgical History:  Procedure Laterality Date   CARDIOVERSION N/A 02/23/2020   Procedure: CARDIOVERSION;  Surgeon: Werner Lean, MD;  Location: [facility];  Service: Cardiovascular;  Laterality: N/A;   CARDIOVERSION N/A 04/12/2020   Procedure: CARDIOVERSION;  Surgeon: Buford Dresser, MD;  Location: [facility] ENDOSCOPY;  Service: Cardiovascular;  Laterality: N/A;   CATARACT  EXTRACTION Left    2019   TONSILLECTOMY     Social History   Social History Narrative   Married with 1 son and 1 daughter   Former smoker no alcohol no caffeine no tobacco or drug use   family history includes Diabetes in his mother; Leukemia in his maternal great-grandmother; Stroke in his mother.   Review of Systems As per HPI otherwise negative.  Objective:   Physical Exam '@BP'$  110/64   Pulse 69   Ht 6' (1.829 m)   Wt 283 lb (128.4 kg)   SpO2 96%   BMI 38.38 kg/m @  General:  Well-developed, well-nourished and in no acute distress Eyes:  anicteric.  Lungs: Clear to auscultation bilaterally. Heart:   S1S2, no rubs, murmurs, gallops. Abdomen:  Protuberant soft, non-tender, no hepatosplenomegaly, hernia, or mass and BS+.  Rectal: deferred Lymph:  no cervical or supraclavicular adenopathy. Extremities:  no edema, cyanosis or clubbing Skin   Mild truncal erythema Neuro:  A&O x 3.  Psych:  appropriate mood and  Affect.   Data Reviewed: See HPI

## 2022-04-19 ENCOUNTER — Inpatient Hospital Stay: Payer: Medicare HMO | Admitting: Nutrition

## 2022-04-19 LAB — SEDIMENTATION RATE: Sed Rate: 54 mm/hr — ABNORMAL HIGH (ref 0–30)

## 2022-04-19 LAB — COMPLEMENT COMPONENT C1Q: Complement C1Q: 17.7 mg/dL (ref 10.2–20.3)

## 2022-04-19 LAB — ANA W/REFLEX IF POSITIVE: Anti Nuclear Antibody (ANA): NEGATIVE

## 2022-04-19 LAB — C2 COMPLEMENT: C2 Complement: 3.3 mg/dL (ref 1.4–3.3)

## 2022-04-19 LAB — C-REACTIVE PROTEIN: CRP: 43 mg/L — ABNORMAL HIGH (ref 0–10)

## 2022-04-19 LAB — C3 AND C4
Complement C3, Serum: 170 mg/dL — ABNORMAL HIGH (ref 82–167)
Complement C4, Serum: 28 mg/dL (ref 12–38)

## 2022-04-19 NOTE — Progress Notes (Signed)
Late Entry:   Brief nutrition visit on 04/18/22 with patient's wife and daughter. Patient is being worked up for primary site of stage IV cancer. Family endorses weight loss and poor intake. Patient noted to have weight loss. Currently with ascites. Nutrition appointment pending with RD. Full nutrition assessment to be done at scheduled visit. Provided nutrition fact sheets on ways to increase calories and protein while following a full liquid diet or low fiber pureed foods. Provided nutrition samples for patient to try to increase calories and protein. Provided one complimentary case of Ensure Complete. Questions answered. Contact information provided.

## 2022-04-20 ENCOUNTER — Encounter: Payer: Self-pay | Admitting: General Practice

## 2022-04-20 NOTE — Progress Notes (Signed)
Vickie Epley, MD  Allen Kell, NT OK to hold for 48 hours. Lysbeth Galas T. Quentin Ore, MD, [facility], [facility] Cardiac Electrophysiology       Previous Messages    ----- Message ----- From: Allen Kell, NT Sent: 04/17/2022  11:13 AM EST To: Vickie Epley, MD Subject: Hold Blood thinner                            Good Morning The above pt is scheduled for a biopsy on 12/18 and will need to hold Eliquis for 48 hrs prior to this procedure. Can we have your permission to hold this medication?  Thanks [facility] Radiology Scheduling

## 2022-04-24 ENCOUNTER — Encounter: Payer: Self-pay | Admitting: Certified Registered Nurse Anesthetist

## 2022-04-24 ENCOUNTER — Other Ambulatory Visit: Payer: Self-pay | Admitting: Radiology

## 2022-04-24 ENCOUNTER — Other Ambulatory Visit: Payer: Self-pay

## 2022-04-24 ENCOUNTER — Ambulatory Visit (HOSPITAL_COMMUNITY)
Admission: RE | Admit: 2022-04-24 | Discharge: 2022-04-24 | Disposition: A | Discharge status: Home / Self Care | Payer: Medicare HMO | Source: Ambulatory Visit | Attending: Oncology | Admitting: Oncology

## 2022-04-24 ENCOUNTER — Other Ambulatory Visit: Payer: Self-pay | Admitting: Oncology

## 2022-04-24 ENCOUNTER — Encounter (HOSPITAL_COMMUNITY): Payer: Self-pay

## 2022-04-24 DIAGNOSIS — C800 Disseminated malignant neoplasm, unspecified: Secondary | ICD-10-CM

## 2022-04-24 DIAGNOSIS — R188 Other ascites: Secondary | ICD-10-CM | POA: Insufficient documentation

## 2022-04-24 DIAGNOSIS — Z539 Procedure and treatment not carried out, unspecified reason: Secondary | ICD-10-CM | POA: Insufficient documentation

## 2022-04-24 DIAGNOSIS — Z7901 Long term (current) use of anticoagulants: Secondary | ICD-10-CM | POA: Insufficient documentation

## 2022-04-24 DIAGNOSIS — R1900 Intra-abdominal and pelvic swelling, mass and lump, unspecified site: Secondary | ICD-10-CM

## 2022-04-24 DIAGNOSIS — C786 Secondary malignant neoplasm of retroperitoneum and peritoneum: Secondary | ICD-10-CM | POA: Insufficient documentation

## 2022-04-24 DIAGNOSIS — R9389 Abnormal findings on diagnostic imaging of other specified body structures: Secondary | ICD-10-CM

## 2022-04-24 LAB — CBC
HCT: 37.9 % — ABNORMAL LOW (ref 39.0–52.0)
Hemoglobin: 12.4 g/dL — ABNORMAL LOW (ref 13.0–17.0)
MCH: 28.6 pg (ref 26.0–34.0)
MCHC: 32.7 g/dL (ref 30.0–36.0)
MCV: 87.3 fL (ref 80.0–100.0)
Platelets: 316 10*3/uL (ref 150–400)
RBC: 4.34 MIL/uL (ref 4.22–5.81)
RDW: 14.9 % (ref 11.5–15.5)
WBC: 13.7 10*3/uL — ABNORMAL HIGH (ref 4.0–10.5)
nRBC: 0 % (ref 0.0–0.2)

## 2022-04-24 LAB — PROTIME-INR
INR: 1.1 (ref 0.8–1.2)
Prothrombin Time: 14.5 seconds (ref 11.4–15.2)

## 2022-04-24 MED ORDER — LIDOCAINE HCL 1 % IJ SOLN: 10.0000 mL | Freq: Once | INTRAMUSCULAR | Status: DC

## 2022-04-24 MED ORDER — MIDAZOLAM HCL 2 MG/2ML IJ SOLN
INTRAMUSCULAR | Status: AC | PRN
  Administered 2022-04-24: 1 mg via INTRAVENOUS
  Administered 2022-04-24: .5 mg via INTRAVENOUS

## 2022-04-24 MED ORDER — FENTANYL CITRATE (PF) 100 MCG/2ML IJ SOLN
INTRAMUSCULAR | Status: AC | PRN
  Administered 2022-04-24: 25 ug via INTRAVENOUS
  Administered 2022-04-24: 50 ug via INTRAVENOUS

## 2022-04-24 MED ORDER — FENTANYL CITRATE (PF) 100 MCG/2ML IJ SOLN
INTRAMUSCULAR | Status: AC
  Filled 2022-04-24: qty 4

## 2022-04-24 MED ORDER — SODIUM CHLORIDE 0.9 % IV SOLN: INTRAVENOUS | Status: DC

## 2022-04-24 MED ORDER — MIDAZOLAM HCL 2 MG/2ML IJ SOLN
INTRAMUSCULAR | Status: AC
  Filled 2022-04-24: qty 4

## 2022-04-24 NOTE — Progress Notes (Signed)
Per Todd Harrison client may resume eliquis tomorrow

## 2022-04-24 NOTE — H&P (Signed)
Chief Complaint: Patient was seen in consultation today for peritoneal mass biopsy   Referring Physician(s): Betsy Coder B  Supervising Physician: Michaelle Birks  Patient Status: [facility] - Out-pt  History of Present Illness: Todd Harrison is a 76 y.o. male being worked up for possible peritoneal carcinomatosis. Imaging reviewed showing omental and peritoneal nodularity with ascites. He is referred for biopsy and paracentesis. PMHx, meds, labs, imaging, allergies reviewed. Has held his Eliquis x 48 hours as directed. Feels well, no recent fevers, chills, illness. Has been NPO today as directed.    Past Medical History:  Diagnosis Date   A-fib ([place])    Asthma    Lichen planus    Methotrexate, long term, current use    no longer taking   Psoriasis    Sleep apnea    Thyroid disease    Urticaria     Past Surgical History:  Procedure Laterality Date   CARDIOVERSION N/A 02/23/2020   Procedure: CARDIOVERSION;  Surgeon: Werner Lean, MD;  Location: [facility] ENDOSCOPY;  Service: Cardiovascular;  Laterality: N/A;   CARDIOVERSION N/A 04/12/2020   Procedure: CARDIOVERSION;  Surgeon: Buford Dresser, MD;  Location: [facility] ENDOSCOPY;  Service: Cardiovascular;  Laterality: N/A;   CATARACT EXTRACTION Left    2019   TONSILLECTOMY      Allergies: Gold and Mixed grasses  Medications: Prior to Admission medications   Medication Sig Start Date End Date Taking? Authorizing Provider  B Complex Vitamins (VITAMIN B COMPLEX PO) Take 1 tablet by mouth every other day.   Yes [provider]  Cholecalciferol (VITAMIN D3 PO) Take 1 tablet by mouth daily.   Yes [provider]  clobetasol ointment (TEMOVATE) 0.05 % APPLY TO AFFECTED AREA TWICE A DAY 08/18/20  Yes [provider]  ELIQUIS 5 MG TABS tablet TAKE 1 TABLET BY MOUTH TWICE A DAY 02/22/22  Yes Vickie Epley, MD  hydroxychloroquine (PLAQUENIL) 200 MG tablet Take 200 mg by mouth 2 (two) times  daily. 06/17/21  Yes [provider]  ISOtretinoin (ACCUTANE) 40 MG capsule Take 40 mg by mouth 2 (two) times daily.  01/13/20  Yes [provider]  levothyroxine (SYNTHROID, LEVOTHROID) 100 MCG tablet Take 112 mcg by mouth daily before breakfast. 05/06/11  Yes [provider]  loratadine (CLARITIN) 10 MG tablet Take 10 mg by mouth daily.   Yes [provider]  metoprolol succinate (TOPROL-XL) 50 MG 24 hr tablet TAKE 1 TABLET BY MOUTH DAILY. TAKE WITH OR IMMEDIATELY FOLLOWING A MEAL. 04/18/22  Yes Vickie Epley, MD  omeprazole (PRILOSEC) 40 MG capsule Take 40 mg by mouth daily. 04/12/22  Yes [provider]  ondansetron (ZOFRAN) 8 MG tablet Take 1 tablet (8 mg total) by mouth every 8 (eight) hours as needed for nausea or vomiting. 04/17/22  Yes Ladell Pier, MD  predniSONE (DELTASONE) 20 MG tablet Take 40 mg by mouth daily with breakfast. 40 mg for 1 week, 20 mg x 1 weel   Yes [provider]  rosuvastatin (CRESTOR) 10 MG tablet TAKE 1 TABLET DAILY 04/18/22  Yes Vickie Epley, MD  traMADol (ULTRAM) 50 MG tablet Take 1 tablet (50 mg total) by mouth every 6 (six) hours as needed. 04/14/22  Yes Ladell Pier, MD  triamcinolone (KENALOG) 0.1 % Apply 1 application topically daily as needed (lichen planus flare).   Yes [provider]  famotidine (PEPCID) 20 MG tablet Take 20 mg by mouth 2 (two) times daily. Patient not taking: Reported  on 04/18/2022    [provider]  hydrOXYzine (ATARAX) 10 MG tablet Take 1 tablet (10 mg total) by mouth 3 (three) times daily as needed. 04/14/22   Ladell Pier, MD  Turmeric 500 MG CAPS Take by mouth. Take as directed Patient not taking: Reported on 04/18/2022    [provider]     Family History  Problem Relation Age of Onset   Diabetes Mother    Stroke Mother    Leukemia Maternal Great-grandmother    Allergic rhinitis Neg Hx    Asthma Neg Hx    Eczema Neg Hx     Urticaria Neg Hx    Colon cancer Neg Hx    Esophageal cancer Neg Hx    Stomach cancer Neg Hx     Social History   Socioeconomic History   Marital status: Married    Spouse name: Not on file   Number of children: Not on file   Years of education: Not on file   Highest education level: Not on file  Occupational History   Not on file  Tobacco Use   Smoking status: Former    Types: Cigarettes    Quit date: 1982    Years since quitting: 41.9   Smokeless tobacco: Never  Vaping Use   Vaping Use: Not on file  Substance and Sexual Activity   Alcohol use: Yes    Alcohol/week: 1.0 standard drink of alcohol    Types: 1 Glasses of wine per week    Comment: occasionally   Drug use: No   Sexual activity: Not on file  Other Topics Concern   Not on file  Social History Narrative   Married with 1 son and 1 daughter   Former smoker no alcohol no caffeine no tobacco or drug use   Social Determinants of Health   Financial Resource Strain: Low Risk  (04/17/2022)   Overall Financial Resource Strain (CARDIA)    Difficulty of Paying Living Expenses: Not hard at all  Food Insecurity: No Food Insecurity (04/17/2022)   Hunger Vital Sign    Worried About Running Out of Food in the Last Year: Never true    [place] in the Last Year: Never true  Transportation Needs: No Transportation Needs (04/17/2022)   PRAPARE - Hydrologist (Medical): No    Lack of Transportation (Non-Medical): No  Physical Activity: Not on file  Stress: No Stress Concern Present (04/17/2022)   [place]    Feeling of Stress : Not at all  Social Connections: Not on file    Review of Systems: A 12 point ROS discussed and pertinent positives are indicated in the HPI above.  All other systems are negative.  Review of Systems  Vital Signs: There were no vitals taken for this visit.  Physical Exam Constitutional:       Appearance: Normal appearance. He is not ill-appearing.  HENT:     Mouth/Throat:     Mouth: Mucous membranes are moist.     Pharynx: Oropharynx is clear.  Cardiovascular:     Rate and Rhythm: Normal rate and regular rhythm.     Heart sounds: Normal heart sounds.  Pulmonary:     Effort: Pulmonary effort is normal. No respiratory distress.     Breath sounds: Normal breath sounds.  Abdominal:     General: There is distension.     Palpations: Abdomen is soft.  Tenderness: There is no abdominal tenderness.  Neurological:     General: No focal deficit present.     Mental Status: He is alert and oriented to person, place, and time.  Psychiatric:        Mood and Affect: Mood normal.        Thought Content: Thought content normal.        Judgment: Judgment normal.     Imaging: CT Abdomen Pelvis W Contrast  Result Date: 04/13/2022 CLINICAL DATA:  Abdominal pain, acute, nonlocalized. Epigastric pain and vomiting after eating for 2 weeks. EXAM: CT ABDOMEN AND PELVIS WITH CONTRAST TECHNIQUE: Multidetector CT imaging of the abdomen and pelvis was performed using the standard protocol following bolus administration of intravenous contrast. RADIATION DOSE REDUCTION: This exam was performed according to the departmental dose-optimization program which includes automated exposure control, adjustment of the mA and/or kV according to patient size and/or use of iterative reconstruction technique. CONTRAST:  123m OMNIPAQUE IOHEXOL 300 MG/ML  SOLN COMPARISON:  Limited correlation made with noncontrast chest CT 08/11/2020. FINDINGS: Lower chest: New small left greater than right pleural effusions with associated dependent atelectasis in both lower lobes. No suspicious pulmonary nodularity. Aortic and coronary artery atherosclerosis with calcifications of the aortic valve. Hepatobiliary: The liver is normal in density without suspicious focal abnormality. Probable incidental venous malformation involving  the right hepatic and portal veins. No evidence of gallstones, gallbladder wall thickening or biliary dilatation. Pancreas: Unremarkable. No pancreatic ductal dilatation or surrounding inflammatory changes. Spleen: Normal in size without focal abnormality. Adrenals/Urinary Tract: Both adrenal glands appear normal. No evidence of urinary tract calculus, suspicious renal lesion or hydronephrosis. The bladder appears unremarkable for its degree of distention. Stomach/Bowel: No enteric contrast administered. The stomach appears unremarkable for its degree of distention. The small bowel is decompressed without apparent wall thickening or surrounding inflammation. There is a possible nearly circumferential mass involving the distal transverse colon (image 36/2). No evidence of bowel obstruction or perforation. There are diverticular changes in the sigmoid colon. Vascular/Lymphatic: No discretely enlarged abdominopelvic lymph nodes are identified. There is aortic and branch vessel atherosclerosis without evidence of aneurysm or large vessel occlusion. The portal, superior mesenteric and splenic veins are patent. Reproductive: The prostate gland and seminal vesicles appear normal. Other: Moderate amount of ascites with diffuse omental and peritoneal nodularity highly worrisome for peritoneal carcinomatosis. Possible 1.8 cm soft tissue mass between the bladder and sigmoid colon on image 73/2. No pneumoperitoneum. Musculoskeletal: No acute or significant osseous findings. IMPRESSION: 1. Moderate amount of ascites with diffuse omental and peritoneal nodularity highly worrisome for peritoneal carcinomatosis. Recommend diagnostic paracentesis. 2. Possible circumferential mass involving the transverse colon which could reflect a primary colonic malignancy. No other primary malignancy identified in the abdomen or pelvis. No evidence of bowel obstruction or perforation. 3. New small left greater than right pleural effusions with  associated dependent atelectasis in both lower lobes. No suspicious pulmonary nodularity identified. 4. Probable incidental venous malformation involving the right hepatic and portal veins. 5.  Aortic Atherosclerosis (ICD10-I70.0). Electronically Signed   By: WRichardean SaleM.D.   On: 04/13/2022 12:14    Labs:  CBC: Recent Labs    04/13/22 0859  WBC 8.0  HGB 12.4*  HCT 38.4*  PLT 230    COAGS: No results for input(s): "INR", "APTT" in the last 8760 hours.  BMP: Recent Labs    04/13/22 0859  NA 141  K 4.0  CL 104  CO2 26  GLUCOSE 95  BUN  12  CALCIUM 9.1  CREATININE 0.99  GFRNONAA >60    LIVER FUNCTION TESTS: Recent Labs    04/13/22 0859  BILITOT 0.5  AST 16  ALT 10  ALKPHOS 76  PROT 6.1*  ALBUMIN 3.4*     Assessment and Plan: Peritoneal/omental nodules with ascites For image guided paracentesis and biopsy. Labs reviewed. Risks and benefits of biopsy and paracentesis was discussed with the patient and/or patient's family including, but not limited to bleeding, infection, damage to adjacent structures or low yield requiring additional tests.  All of the questions were answered and there is agreement to proceed.  Consent signed and in chart.    Electronically Signed: Ascencion Dike, PA-C 04/24/2022, 9:07 AM   I spent a total of 20 in face to face in clinical consultation, greater than 50% of which was counseling/coordinating care for paracentesis and biopsy

## 2022-04-24 NOTE — Procedures (Signed)
Vascular and Interventional Radiology Procedure Note  Patient: Todd Harrison DOB: 1945/08/08 Medical Record Number: [id number] Note Date/Time: 04/24/22 10:29 AM   Performing Physician: Michaelle Birks, MD Assistant(s): None  Diagnosis: Suspected Colon CA  Procedure:  OMENTAL BIOPSY DX and RX PARACENTESIS  Anesthesia: Conscious Sedation Complications: None Estimated Blood Loss: Minimal Specimens: Sent for Cytology and Pathology  Findings:  Successful CT-guided biopsy of omental caking. A total of 3 samples were obtained. Hemostasis of the tract was achieved using Manual Pressure.   The LEFT lower quadrant peritoneal space was accessed with a 63F Pigtail catheter, and 700 mL of fluid was obtained. Intravenous Albumin: Was not administered.  Plan: Bed rest for 1 hours.   Michaelle Birks, MD [facility] Radiology   Pager. [place]

## 2022-04-24 NOTE — H&P (Signed)
Chief Complaint: Patient was seen in consultation today for peritoneal mass biopsy    Referring Physician(s): Betsy Coder B   Supervising Physician: Michaelle Birks   Patient Status: [facility] - Out-pt   History of Present Illness: Todd Harrison is a 76 y.o. male being worked up for possible peritoneal carcinomatosis. Imaging reviewed showing omental and peritoneal nodularity with ascites. He is referred for biopsy and paracentesis. PMHx, meds, labs, imaging, allergies reviewed. Has held his Eliquis x 48 hours as directed. Feels well, no recent fevers, chills, illness. Has been NPO today as directed.           Past Medical History:  Diagnosis Date   A-fib ([place])     Asthma     Lichen planus     Methotrexate, long term, current use      no longer taking   Psoriasis     Sleep apnea     Thyroid disease     Urticaria             Past Surgical History:  Procedure Laterality Date   CARDIOVERSION N/A 02/23/2020    Procedure: CARDIOVERSION;  Surgeon: Werner Lean, MD;  Location: [facility] ENDOSCOPY;  Service: Cardiovascular;  Laterality: N/A;   CARDIOVERSION N/A 04/12/2020    Procedure: CARDIOVERSION;  Surgeon: Buford Dresser, MD;  Location: [facility] ENDOSCOPY;  Service: Cardiovascular;  Laterality: N/A;   CATARACT EXTRACTION Left      2019   TONSILLECTOMY          Allergies: Gold and Mixed grasses   Medications:        Prior to Admission medications   Medication Sig Start Date End Date Taking? Authorizing Provider  B Complex Vitamins (VITAMIN B COMPLEX PO) Take 1 tablet by mouth every other day.     Yes [provider]  Cholecalciferol (VITAMIN D3 PO) Take 1 tablet by mouth daily.     Yes [provider]  clobetasol ointment (TEMOVATE) 0.05 % APPLY TO AFFECTED AREA TWICE A DAY 08/18/20   Yes [provider]  ELIQUIS 5 MG TABS tablet TAKE 1 TABLET BY MOUTH TWICE A DAY 02/22/22   Yes Vickie Epley, MD  hydroxychloroquine  (PLAQUENIL) 200 MG tablet Take 200 mg by mouth 2 (two) times daily. 06/17/21   Yes [provider]  ISOtretinoin (ACCUTANE) 40 MG capsule Take 40 mg by mouth 2 (two) times daily.  01/13/20   Yes [provider]  levothyroxine (SYNTHROID, LEVOTHROID) 100 MCG tablet Take 112 mcg by mouth daily before breakfast. 05/06/11   Yes [provider]  loratadine (CLARITIN) 10 MG tablet Take 10 mg by mouth daily.     Yes [provider]  metoprolol succinate (TOPROL-XL) 50 MG 24 hr tablet TAKE 1 TABLET BY MOUTH DAILY. TAKE WITH OR IMMEDIATELY FOLLOWING A MEAL. 04/18/22   Yes Vickie Epley, MD  omeprazole (PRILOSEC) 40 MG capsule Take 40 mg by mouth daily. 04/12/22   Yes [provider]  ondansetron (ZOFRAN) 8 MG tablet Take 1 tablet (8 mg total) by mouth every 8 (eight) hours as needed for nausea or vomiting. 04/17/22   Yes Ladell Pier, MD  predniSONE (DELTASONE) 20 MG tablet Take 40 mg by mouth daily with breakfast. 40 mg for 1 week, 20 mg x 1 weel     Yes [provider]  rosuvastatin (CRESTOR) 10 MG tablet TAKE 1 TABLET DAILY 04/18/22   Yes Vickie Epley, MD  traMADol (ULTRAM) 50 MG tablet  Take 1 tablet (50 mg total) by mouth every 6 (six) hours as needed. 04/14/22   Yes Ladell Pier, MD  triamcinolone (KENALOG) 0.1 % Apply 1 application topically daily as needed (lichen planus flare).     Yes [provider]  famotidine (PEPCID) 20 MG tablet Take 20 mg by mouth 2 (two) times daily. Patient not taking: Reported on 04/18/2022       [provider]  hydrOXYzine (ATARAX) 10 MG tablet Take 1 tablet (10 mg total) by mouth 3 (three) times daily as needed. 04/14/22     Ladell Pier, MD  Turmeric 500 MG CAPS Take by mouth. Take as directed Patient not taking: Reported on 04/18/2022       [provider]           Family History  Problem Relation Age of Onset   Diabetes Mother     Stroke Mother     Leukemia  Maternal Great-grandmother     Allergic rhinitis Neg Hx     Asthma Neg Hx     Eczema Neg Hx     Urticaria Neg Hx     Colon cancer Neg Hx     Esophageal cancer Neg Hx     Stomach cancer Neg Hx        Social History         Socioeconomic History   Marital status: Married      Spouse name: Not on file   Number of children: Not on file   Years of education: Not on file   Highest education level: Not on file  Occupational History   Not on file  Tobacco Use   Smoking status: Former      Types: Cigarettes      Quit date: 1982      Years since quitting: 41.9   Smokeless tobacco: Never  Vaping Use   Vaping Use: Not on file  Substance and Sexual Activity   Alcohol use: Yes      Alcohol/week: 1.0 standard drink of alcohol      Types: 1 Glasses of wine per week      Comment: occasionally   Drug use: No   Sexual activity: Not on file  Other Topics Concern   Not on file  Social History Narrative    Married with 1 son and 1 daughter    Former smoker no alcohol no caffeine no tobacco or drug use    Social Determinants of Health        Financial Resource Strain: Low Risk  (04/17/2022)    Overall Financial Resource Strain (CARDIA)     Difficulty of Paying Living Expenses: Not hard at all  Food Insecurity: No Food Insecurity (04/17/2022)    Hunger Vital Sign     Worried About Running Out of Food in the Last Year: Never true     [place] in the Last Year: Never true  Transportation Needs: No Transportation Needs (04/17/2022)    PRAPARE - Armed forces logistics/support/administrative officer (Medical): No     Lack of Transportation (Non-Medical): No  Physical Activity: Not on file  Stress: No Stress Concern Present (04/17/2022)    [place]     Feeling of Stress : Not at all  Social Connections: Not on file      Review of Systems: A 12 point ROS discussed and pertinent positives are indicated in the HPI  above.   All other systems are negative.   Review of Systems   Vital Signs: There were no vitals taken for this visit.   Physical Exam Constitutional:      Appearance: Normal appearance. He is not ill-appearing.  HENT:     Mouth/Throat:     Mouth: Mucous membranes are moist.     Pharynx: Oropharynx is clear.  Cardiovascular:     Rate and Rhythm: Normal rate and regular rhythm.     Heart sounds: Normal heart sounds.  Pulmonary:     Effort: Pulmonary effort is normal. No respiratory distress.     Breath sounds: Normal breath sounds.  Abdominal:     General: There is distension.     Palpations: Abdomen is soft.     Tenderness: There is no abdominal tenderness.  Neurological:     General: No focal deficit present.     Mental Status: He is alert and oriented to person, place, and time.  Psychiatric:        Mood and Affect: Mood normal.        Thought Content: Thought content normal.        Judgment: Judgment normal.        Imaging:  Imaging Results  CT Abdomen Pelvis W Contrast   Result Date: 04/13/2022 CLINICAL DATA:  Abdominal pain, acute, nonlocalized. Epigastric pain and vomiting after eating for 2 weeks. EXAM: CT ABDOMEN AND PELVIS WITH CONTRAST TECHNIQUE: Multidetector CT imaging of the abdomen and pelvis was performed using the standard protocol following bolus administration of intravenous contrast. RADIATION DOSE REDUCTION: This exam was performed according to the departmental dose-optimization program which includes automated exposure control, adjustment of the mA and/or kV according to patient size and/or use of iterative reconstruction technique. CONTRAST:  176m OMNIPAQUE IOHEXOL 300 MG/ML  SOLN COMPARISON:  Limited correlation made with noncontrast chest CT 08/11/2020. FINDINGS: Lower chest: New small left greater than right pleural effusions with associated dependent atelectasis in both lower lobes. No suspicious pulmonary nodularity. Aortic and coronary artery atherosclerosis  with calcifications of the aortic valve. Hepatobiliary: The liver is normal in density without suspicious focal abnormality. Probable incidental venous malformation involving the right hepatic and portal veins. No evidence of gallstones, gallbladder wall thickening or biliary dilatation. Pancreas: Unremarkable. No pancreatic ductal dilatation or surrounding inflammatory changes. Spleen: Normal in size without focal abnormality. Adrenals/Urinary Tract: Both adrenal glands appear normal. No evidence of urinary tract calculus, suspicious renal lesion or hydronephrosis. The bladder appears unremarkable for its degree of distention. Stomach/Bowel: No enteric contrast administered. The stomach appears unremarkable for its degree of distention. The small bowel is decompressed without apparent wall thickening or surrounding inflammation. There is a possible nearly circumferential mass involving the distal transverse colon (image 36/2). No evidence of bowel obstruction or perforation. There are diverticular changes in the sigmoid colon. Vascular/Lymphatic: No discretely enlarged abdominopelvic lymph nodes are identified. There is aortic and branch vessel atherosclerosis without evidence of aneurysm or large vessel occlusion. The portal, superior mesenteric and splenic veins are patent. Reproductive: The prostate gland and seminal vesicles appear normal. Other: Moderate amount of ascites with diffuse omental and peritoneal nodularity highly worrisome for peritoneal carcinomatosis. Possible 1.8 cm soft tissue mass between the bladder and sigmoid colon on image 73/2. No pneumoperitoneum. Musculoskeletal: No acute or significant osseous findings. IMPRESSION: 1. Moderate amount of ascites with diffuse omental and peritoneal nodularity highly worrisome for peritoneal carcinomatosis. Recommend diagnostic paracentesis. 2. Possible circumferential mass involving the transverse colon which could  reflect a primary colonic malignancy.  No other primary malignancy identified in the abdomen or pelvis. No evidence of bowel obstruction or perforation. 3. New small left greater than right pleural effusions with associated dependent atelectasis in both lower lobes. No suspicious pulmonary nodularity identified. 4. Probable incidental venous malformation involving the right hepatic and portal veins. 5.  Aortic Atherosclerosis (ICD10-I70.0). Electronically Signed   By: Richardean Sale M.D.   On: 04/13/2022 12:14       Labs:   CBC: Recent Labs (within last 365 days)     Recent Labs    04/13/22 0859  WBC 8.0  HGB 12.4*  HCT 38.4*  PLT 230        COAGS: Recent Labs (within last 365 days)  No results for input(s): "INR", "APTT" in the last 8760 hours.     BMP: Recent Labs (within last 365 days)     Recent Labs    04/13/22 0859  NA 141  K 4.0  CL 104  CO2 26  GLUCOSE 95  BUN 12  CALCIUM 9.1  CREATININE 0.99  GFRNONAA >60        LIVER FUNCTION TESTS: Recent Labs (within last 365 days)     Recent Labs    04/13/22 0859  BILITOT 0.5  AST 16  ALT 10  ALKPHOS 76  PROT 6.1*  ALBUMIN 3.4*          Assessment and Plan: Peritoneal/omental nodules with ascites For image guided paracentesis and biopsy. Labs reviewed. Risks and benefits of biopsy and paracentesis was discussed with the patient and/or patient's family including, but not limited to bleeding, infection, damage to adjacent structures or low yield requiring additional tests.   All of the questions were answered and there is agreement to proceed.   Consent signed and in chart.       Electronically Signed: Ascencion Dike, PA-C 04/24/2022, 9:07 AM    I spent a total of 20 in face to face in clinical consultation, greater than 50% of which was counseling/coordinating care for paracentesis and biopsy

## 2022-04-25 ENCOUNTER — Ambulatory Visit (AMBULATORY_SURGERY_CENTER): Payer: Medicare HMO | Admitting: Internal Medicine

## 2022-04-25 ENCOUNTER — Encounter: Payer: Self-pay | Admitting: Internal Medicine

## 2022-04-25 ENCOUNTER — Other Ambulatory Visit: Payer: Self-pay | Admitting: Cardiology

## 2022-04-25 VITALS — BP 152/92 | HR 64 | Temp 98.0°F | Resp 20 | Ht 72.0 in | Wt 283.0 lb

## 2022-04-25 DIAGNOSIS — C800 Disseminated malignant neoplasm, unspecified: Secondary | ICD-10-CM | POA: Diagnosis not present

## 2022-04-25 DIAGNOSIS — R933 Abnormal findings on diagnostic imaging of other parts of digestive tract: Secondary | ICD-10-CM

## 2022-04-25 DIAGNOSIS — K5730 Diverticulosis of large intestine without perforation or abscess without bleeding: Secondary | ICD-10-CM

## 2022-04-25 DIAGNOSIS — K3189 Other diseases of stomach and duodenum: Secondary | ICD-10-CM

## 2022-04-25 MED ORDER — SODIUM CHLORIDE 0.9 % IV SOLN: 500.0000 mL | Freq: Once | INTRAVENOUS | Status: DC

## 2022-04-25 NOTE — Op Note (Signed)
[place] Patient Name: Todd Harrison Procedure Date: 04/25/2022 2:35 PM MRN: [id number] Endoscopist: Gatha Mayer , MD, [id number] Age: 76 Referring MD:  Date of Birth: 10-09-1945 Gender: Male Account #: [id number] Procedure:                Colonoscopy Indications:              Abnormal CT of the GI tract, carcinomatosis Medicines:                Monitored Anesthesia Care Procedure:                Pre-Anesthesia Assessment:                           - Prior to the procedure, a History and Physical                            was performed, and patient medications and                            allergies were reviewed. The patient's tolerance of                            previous anesthesia was also reviewed. The risks                            and benefits of the procedure and the sedation                            options and risks were discussed with the patient.                            All questions were answered, and informed consent                            was obtained. Prior Anticoagulants: The patient                            last took Eliquis (apixaban) 3 days prior to the                            procedure. ASA Grade Assessment: III - A patient                            with severe systemic disease. After reviewing the                            risks and benefits, the patient was deemed in                            satisfactory condition to undergo the procedure.                           After obtaining informed consent, the colonoscope  was passed under direct vision. Throughout the                            procedure, the patient's blood pressure, pulse, and                            oxygen saturations were monitored continuously. The                            Olympus CF-HQ190L (Serial# [id number]) Colonoscope was                            introduced through the anus and advanced to the the                             cecum, identified by appendiceal orifice and                            ileocecal valve. The colonoscopy was performed                            without difficulty. The patient tolerated the                            procedure well. The quality of the bowel                            preparation was adequate. The ileocecal valve,                            appendiceal orifice, and rectum were photographed. Scope In: 2:55:03 PM Scope Out: 3:06:36 PM Scope Withdrawal Time: 0 hours 6 minutes 22 seconds  Total Procedure Duration: 0 hours 11 minutes 33 seconds  Findings:                 The perianal and digital rectal examinations were                            normal.                           Multiple diverticula were found in the sigmoid                            colon.                           Internal hemorrhoids were found. The hemorrhoids                            were small.                           The exam was otherwise without abnormality on  direct and retroflexion views. Complications:            No immediate complications. Estimated Blood Loss:     Estimated blood loss: none. Impression:               - Diverticulosis in the sigmoid colon.                           - Internal hemorrhoids.                           - The examination was otherwise normal on direct                            and retroflexion views except for suspicion of                            extrinsic compression and mildl restrited movement                            of the colon which could be due to carcinomatosis.                            No signs of tumor in transverse colon as suspected                            by CT.                           - No specimens collected. Recommendation:           - Patient has a contact number available for                            emergencies. The signs and symptoms of potential                            delayed complications  were discussed with the                            patient. Return to normal activities tomorrow.                            Written discharge instructions were provided to the                            patient.                           - Resume previous diet.                           - Continue present medications.                           - Resume Eliquis (apixaban) at prior dose tomorrow.                           -  See the other procedure note for documentation of                            additional recommendations. Will forward gastric                            mass path to Dr. Benay Spice when it is resulted. Gatha Mayer, MD 04/25/2022 3:26:48 PM This report has been signed electronically.

## 2022-04-25 NOTE — Op Note (Signed)
[place] Patient Name: Todd Harrison Procedure Date: 04/25/2022 2:36 PM MRN: [id number] Endoscopist: Gatha Mayer , MD, [id number] Age: 76 Referring MD:  Date of Birth: 10-Jan-1946 Gender: Male Account #: [id number] Procedure:                Upper GI endoscopy Indications:              Abnormal CT of the GI tract, Carcinomatosis Medicines:                Monitored Anesthesia Care Procedure:                Pre-Anesthesia Assessment:                           - Prior to the procedure, a History and Physical                            was performed, and patient medications and                            allergies were reviewed. The patient's tolerance of                            previous anesthesia was also reviewed. The risks                            and benefits of the procedure and the sedation                            options and risks were discussed with the patient.                            All questions were answered, and informed consent                            was obtained. Prior Anticoagulants: The patient                            last took Eliquis (apixaban) 3 days prior to the                            procedure. ASA Grade Assessment: III - A patient                            with severe systemic disease. After reviewing the                            risks and benefits, the patient was deemed in                            satisfactory condition to undergo the procedure.                           After obtaining informed consent, the endoscope was  passed under direct vision. Throughout the                            procedure, the patient's blood pressure, pulse, and                            oxygen saturations were monitored continuously. The                            Endoscope was introduced through the mouth, and                            advanced to the second part of duodenum. The upper                             GI endoscopy was somewhat difficult due to                            significant looping. The patient tolerated the                            procedure well. Scope In: Scope Out: Findings:                 A large, infiltrative mass with oozing bleeding and                            stigmata of recent bleeding was found in the                            gastric fundus, on the anterior wall of the gastric                            body and on the greater curvature of the gastric                            body. Biopsies were taken with a cold forceps for                            histology. Verification of patient identification                            for the specimen was done. Estimated blood loss was                            minimal.                           A deformity was found in the gastric antrum.                           An extrinsic deformity was found in the entire  duodenum.                           The exam was otherwise without abnormality.                           The cardia and gastric fundus were normal on                            retroflexion. Complications:            No immediate complications. Estimated Blood Loss:     Estimated blood loss was minimal. Impression:               - Likely malignant gastric tumor in the gastric                            fundus, on the anterior wall of the gastric body                            and on the greater curvature of the gastric body.                            Mass-like enlargement of folds - some were firm and                            fresh blood in vicinity c/w oozing which may have                            been a result of colonoscopy prep. Biopsied. Sent                            rush                           - Deformity in the gastric antrum.                           - Duodenal deformity. The scope looped and it was                            difficult to pass into the  duodenum - similar to                            paraesophageal hiatal hernia but that was not seen                            on recent CT                           - The examination was otherwise normal. Recommendation:           - Patient has a contact number available for                            emergencies.  The signs and symptoms of potential                            delayed complications were discussed with the                            patient. Return to normal activities tomorrow.                            Written discharge instructions were provided to the                            patient.                           - Resume previous diet.                           - Continue present medications.                           - See the other procedure note for documentation of                            additional recommendations. Gatha Mayer, MD 04/25/2022 3:22:37 PM This report has been signed electronically.

## 2022-04-25 NOTE — Patient Instructions (Addendum)
There is an abnormal area in the stomach that looks like cancer to me - biopsies taken.   The colon showed diverticulosis and hemorrhoids but no tumors or polyps.  The biopsies were sent rush - might be back tomorrow but more likely Thursday 12/21 at the earliest.  Please resume Eliquis tomorrow. (Wednesday 04/26/22)  resume other medications today.  I appreciate the opportunity to care for you. Gatha Mayer, MD, FACG   YOU HAD AN ENDOSCOPIC PROCEDURE TODAY AT [facility]:   Refer to the procedure report that was given to you for any specific questions about what was found during the examination.  If the procedure report does not answer your questions, please call your gastroenterologist to clarify.  If you requested that your care partner not be given the details of your procedure findings, then the procedure report has been included in a sealed envelope for you to review at your convenience later.  YOU SHOULD EXPECT: Some feelings of bloating in the abdomen. Passage of more gas than usual.  Walking can help get rid of the air that was put into your GI tract during the procedure and reduce the bloating. If you had a lower endoscopy (such as a colonoscopy or flexible sigmoidoscopy) you may notice spotting of blood in your stool or on the toilet paper. If you underwent a bowel prep for your procedure, you may not have a normal bowel movement for a few days.  Please Note:  You might notice some irritation and congestion in your nose or some drainage.  This is from the oxygen used during your procedure.  There is no need for concern and it should clear up in a day or so.  SYMPTOMS TO REPORT IMMEDIATELY:  Following lower endoscopy (colonoscopy):  Excessive amounts of blood in the stool  Significant tenderness or worsening of abdominal pains  Swelling of the abdomen that is new, acute  Fever of 100F or higher  Following upper endoscopy (EGD)  Vomiting of blood or coffee  ground material  New chest pain or pain under the shoulder blades  Painful or persistently difficult swallowing  New shortness of breath  Black, tarry-looking stools  For urgent or emergent issues, a gastroenterologist can be reached at any hour by calling [phone]. Do not use MyChart messaging for urgent concerns.    DIET:  We do recommend resuming your liquid diet,  but then you may proceed to a soft diet tomorrow.  Drink plenty of fluids but you should avoid alcoholic beverages for 24 hours.  ACTIVITY:  You should plan to take it easy for the rest of today and you should NOT DRIVE or use heavy machinery until tomorrow (because of the sedation medicines used during the test).    FOLLOW UP: Our staff will call the number listed on your records the next business day following your procedure.  We will call around 7:15- 8:00 am to check on you and address any questions or concerns that you may have regarding the information given to you following your procedure. If we do not reach you, we will leave a message.     If any biopsies were taken you will be contacted by phone or by letter within the next 1-3 days.  Please call us at [phone] if you have not heard about the biopsies in 3 days.    SIGNATURES/CONFIDENTIALITY: You and/or your care partner have signed paperwork which will be entered into your electronic medical record.  These signatures attest to the fact that that the information above on your After Visit Summary has been reviewed and is understood.  Full responsibility of the confidentiality of this discharge information lies with you and/or your care-partner.

## 2022-04-25 NOTE — Progress Notes (Unsigned)
1439 Robinul 0.1 mg IV given due large amount of secretions upon assessment.  MD made aware, vss

## 2022-04-25 NOTE — Progress Notes (Unsigned)
Report given to PACU, vss 

## 2022-04-25 NOTE — Progress Notes (Unsigned)
History and Physical Interval Note:  04/25/2022 2:35 PM  Todd Harrison  has presented today for endoscopic procedure(s), with the diagnosis of  Encounter Diagnoses  Name Primary?   Carcinomatosis (HCC) Yes   Abnormal CT scan, colon   .  The various methods of evaluation and treatment have been discussed with the patient and/or family. After consideration of risks, benefits and other options for treatment, the patient has consented to  the endoscopic procedure(s).   The patient's history has been reviewed, patient examined, no change in status, stable for endoscopic procedure(s).  I have reviewed the patient's chart and labs.  Questions were answered to the patient's satisfaction.     Gatha Mayer, MD, [facility]

## 2022-04-25 NOTE — Progress Notes (Signed)
Called to room to assist during endoscopic procedure.  Patient ID and intended procedure confirmed with present staff. Received instructions for my participation in the procedure from the performing physician.  

## 2022-04-26 ENCOUNTER — Telehealth: Payer: Self-pay | Admitting: *Deleted

## 2022-04-26 ENCOUNTER — Encounter: Payer: Self-pay | Admitting: Nurse Practitioner

## 2022-04-26 ENCOUNTER — Inpatient Hospital Stay: Payer: Medicare HMO | Admitting: Nurse Practitioner

## 2022-04-26 ENCOUNTER — Encounter: Payer: Medicare HMO | Admitting: Nutrition

## 2022-04-26 ENCOUNTER — Encounter: Payer: Self-pay | Admitting: *Deleted

## 2022-04-26 ENCOUNTER — Inpatient Hospital Stay: Payer: Medicare HMO | Admitting: Nutrition

## 2022-04-26 VITALS — BP 116/88 | HR 80 | Temp 98.2°F | Resp 18 | Ht 72.0 in | Wt 267.0 lb

## 2022-04-26 DIAGNOSIS — I4891 Unspecified atrial fibrillation: Secondary | ICD-10-CM | POA: Diagnosis not present

## 2022-04-26 DIAGNOSIS — L439 Lichen planus, unspecified: Secondary | ICD-10-CM | POA: Diagnosis not present

## 2022-04-26 DIAGNOSIS — C169 Malignant neoplasm of stomach, unspecified: Secondary | ICD-10-CM | POA: Insufficient documentation

## 2022-04-26 DIAGNOSIS — C168 Malignant neoplasm of overlapping sites of stomach: Secondary | ICD-10-CM

## 2022-04-26 DIAGNOSIS — R188 Other ascites: Secondary | ICD-10-CM | POA: Diagnosis not present

## 2022-04-26 DIAGNOSIS — Z5111 Encounter for antineoplastic chemotherapy: Secondary | ICD-10-CM | POA: Diagnosis present

## 2022-04-26 DIAGNOSIS — Z87891 Personal history of nicotine dependence: Secondary | ICD-10-CM | POA: Diagnosis not present

## 2022-04-26 DIAGNOSIS — R112 Nausea with vomiting, unspecified: Secondary | ICD-10-CM | POA: Diagnosis not present

## 2022-04-26 DIAGNOSIS — R21 Rash and other nonspecific skin eruption: Secondary | ICD-10-CM | POA: Diagnosis not present

## 2022-04-26 DIAGNOSIS — E039 Hypothyroidism, unspecified: Secondary | ICD-10-CM | POA: Diagnosis not present

## 2022-04-26 DIAGNOSIS — J90 Pleural effusion, not elsewhere classified: Secondary | ICD-10-CM | POA: Diagnosis not present

## 2022-04-26 DIAGNOSIS — C786 Secondary malignant neoplasm of retroperitoneum and peritoneum: Secondary | ICD-10-CM | POA: Diagnosis not present

## 2022-04-26 LAB — SURGICAL PATHOLOGY

## 2022-04-26 MED ORDER — PREDNISONE 20 MG PO TABS: ORAL_TABLET | ORAL | 0 refills | Status: DC

## 2022-04-26 NOTE — Telephone Encounter (Signed)
Attempted to call patient for their post-procedure follow-up call. No answer. Left voicemail.   

## 2022-04-26 NOTE — Progress Notes (Signed)
76 year old male diagnosed with Gastric cancer and evidence of abdominal Carcinomatosis and followed by Dr. Benay Spice. He also has a Truncal rash which improves on Prednisone. Plans for FOLFOX.  PMH includes Afib, Asthma and Thyroid disease  Medications include B complex, Vitamin D 3, Synthroid, Prilosec, Zofran and Prednisone.  Labs include albumin 3.4 on Dec 7.  Ht: 6 feet. Weight: 267 pounds UBW: 303 pounds on June 17, 2021. BMI: 36.21  Patient endorses upset stomach if he overeats. He feels slightly better after the Paracentesis on Dec 18: removed 700 mL fluid. Reports appetite may be slightly improved. He still complains of early satiety. He has been following a full liquid diet with occasional low fiber solids such as the inside of a baked potato and eggs with cheese. He likes ice cream and puddings. He tries to drink 3 bottles of Ensure Complete daily providing an additional 350 kcal and 30 grams protein per bottle. He enjoys this. He is getting tired of the same foods. He has lost ~6% body weight over the past 3 weeks.  Nutrition Focused Physical Exam deferred due to time and added MD appointment. Both daughter and patient endorse loss of muscle mass and fat. No edema but abdomen is distended.  Nutrition Diagnosis: Unintended wt loss related to cancer as evidenced by 12% wt loss from usual body weight over 10 months and 6% over 3 weeks.   Intervention: Educated on strategies for frequent small meals and snacks consisting of high calorie, high protein, low fiber foods and liquids.  Continue Ensure Complete or equivalent TID. Samples and coupons have been provided. Take small bites and chew well. Try pureed allowed foods as tolerated. Nutrition fact sheets provided. Contact information given.  Monitoring, Evaluation, Goals: Patient will tolerate adequate calories and protein to minimize wt loss.  Next Visit: To be scheduled as needed with upcoming treatment.

## 2022-04-26 NOTE — Progress Notes (Signed)
PATIENT NAVIGATOR PROGRESS NOTE  Name: [name] Date: 04/26/2022 MRN: [id number]  DOB: 06/22/1945   Reason for visit:  F/U visit with Ned Card NP and Dr Benay Spice  Comments:  met with Mr Harte, daughter Suzy Bouchard and family Molecular markers IHC/MMR, PD1/CPS and Her 2 ordered from [facility] on accession number [id number] [facility] education provided [facility] placement scheduled at [facility] on 05/03/22  Treatment to be scheduled shortly after [facility] placement for FOLFOX, written information provided.     Time spent counseling/coordinating care: > 60 minutes

## 2022-04-26 NOTE — Progress Notes (Signed)
START ON PATHWAY REGIMEN - Gastroesophageal     A cycle is every 14 days:     Oxaliplatin      Leucovorin      Fluorouracil      Fluorouracil   **Always confirm dose/schedule in your pharmacy ordering system**  Patient Characteristics: Distant Metastases (cM1/pM1) / Locally Recurrent Disease, Adenocarcinoma - Esophageal, GE [place]unction, and Gastric, First Line, HER2 Negative/Unknown, PD?L1 Expression CPS < 5/Negative/Unknown, MSS/pMMR or MSI Unknown Therapeutic Status: Distant Metastases (No Additional Staging) Histology: Adenocarcinoma Disease Classification: Gastric Line of Therapy: First Line HER2 Status: Awaiting Test Results PD-L1 Expression Status: Awaiting Test Results Microsatellite/Mismatch Repair Status: Unknown Intent of Therapy: Non-Curative / Palliative Intent, Discussed with Patient

## 2022-04-26 NOTE — Progress Notes (Signed)
[place] OFFICE PROGRESS NOTE   Diagnosis: Carcinomatosis  INTERVAL HISTORY:   Todd Harrison returns as scheduled.  Overall feeling better.  Notes improvement since the paracentesis procedure.  Having intermittent mild nausea.  Currently on a liquid diet.  No bleeding.  Objective:  Vital signs in last 24 hours:  Blood pressure 116/88, pulse 80, temperature 98.2 F (36.8 C), resp. rate 18, height 6' (1.829 m), weight 267 lb (121.1 kg), SpO2 95 %.     Resp: Decreased breath sounds lower lung field bilaterally.  No respiratory distress. Cardio: Regular rate and rhythm. GI: Abdomen is distended.  No mass.  No hepatosplenomegaly. Vascular: No leg edema. Skin: Faint erythematous rash right mid to low back.   Lab Results:  Lab Results  Component Value Date   WBC 13.7 (H) 04/24/2022   HGB 12.4 (L) 04/24/2022   HCT 37.9 (L) 04/24/2022   MCV 87.3 04/24/2022   PLT 316 04/24/2022   NEUTROABS 5.7 04/13/2022    Imaging:  No results found.  Medications: I have reviewed the patient's current medications.  Assessment/Plan: Gastric cancer with CT evidence of abdominal carcinomatosis CT abdomen/pelvis 04/13/2022-ascites, diffuse omental and peritoneal nodularity, possible circumferential mass in the transverse colon, new small left greater than right pleural effusions CT abdominal mass biopsy 04/24/2022 CT paracentesis 04/24/2022 Colonoscopy 04/25/2022-diverticulosis in the sigmoid colon.  Internal hemorrhoids.  No specimens collected. Upper endoscopy 04/25/2022-large infiltrative mass with oozing bleeding and stigmata of recent bleeding in the gastric fundus, on the anterior wall of the gastric body and on the greater curvature of the gastric body; deformity in the gastric antrum.  Extrinsic deformity in the entire duodenum.  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 Truncal rash-status post recent punch biopsy with  pathology pending; biopsy reported negative per family 05/16/3233 Lichen planus of the feet Hypothyroidism Atrial fibrillation  Disposition: Todd Harrison appears to have metastatic gastric cancer.  We reviewed the diagnosis, prognosis and treatment options with him and his family at today's visit.  His daughter was present in the room.  His wife and son available by phone.  They understand no therapy will be curative.  Dr. Gearldine Shown recommendation is for systemic therapy with FOLFOX.  We reviewed potential side effects associated with chemotherapy including bone marrow toxicity, nausea, hair loss, allergic reaction.  We reviewed potential side effects associated with 5-fluorouracil including mouth sores, diarrhea, hand-foot syndrome, skin hyperpigmentation, skin rash, increased sensitivity to sun.  We discussed potential side effects associated with oxaliplatin including cold sensitivity, acute laryngopharyngeal dysesthesia, peripheral neuropathy and more rare occurrences such as diplopia, ataxia, incontinence.  He agrees to proceed.  He understands we have requested additional testing to include Her2, PD-1 CPS and mismatch repair protein IHC.  He further understands this may alter the treatment plan.  We discussed that a Port-A-Cath is required for this regimen.  We have made a referral to interventional radiology.  Cycle 1 FOLFOX will be scheduled once the Port-A-Cath has been placed.  We will see him in follow-up prior to cycle 2.  Patient seen with Dr. Benay Spice.    Ned Card ANP/GNP-BC   04/26/2022  12:54 PM  This was a shared visit with Ned Card.  Todd Harrison has been diagnosed with gastric cancer.  He appears to have stage IV disease.  Anticoagulation will be placed on hold due to the in situ gastric mass with being noted on the endoscopy yesterday.  We discussed treatment options.  Testing for Her-2  PD-L1, and mismatch repair proteins is pending.  He understands the goal of treatment  is to improve symptoms and prolong survival.  No therapy will be curative.  I recommend FOLFOX.  We reviewed potential toxicities associated with the FOLFOX regimen.  He agrees to proceed.  He will be referred for Port-A-Cath placement with the plan to begin FOLFOX next week.  A chemotherapy plan was entered today.  I was present for greater than 50% of today's visit.  I performed medical decision making.  Julieanne Manson, MD

## 2022-04-27 ENCOUNTER — Other Ambulatory Visit: Payer: Self-pay | Admitting: Cardiology

## 2022-04-27 ENCOUNTER — Other Ambulatory Visit: Payer: Self-pay | Admitting: *Deleted

## 2022-04-27 ENCOUNTER — Ambulatory Visit: Payer: Medicare HMO | Admitting: Nurse Practitioner

## 2022-04-27 MED ORDER — LIDOCAINE-PRILOCAINE 2.5-2.5 % EX CREA: 1.0000 | TOPICAL_CREAM | CUTANEOUS | 2 refills | Status: AC | PRN

## 2022-04-27 MED ORDER — PROCHLORPERAZINE MALEATE 10 MG PO TABS: 10.0000 mg | ORAL_TABLET | Freq: Four times a day (QID) | ORAL | 1 refills | Status: DC | PRN

## 2022-04-28 ENCOUNTER — Other Ambulatory Visit: Payer: Self-pay

## 2022-04-28 LAB — SURGICAL PATHOLOGY

## 2022-04-28 LAB — CYTOLOGY - NON PAP

## 2022-04-30 ENCOUNTER — Other Ambulatory Visit: Payer: Self-pay | Admitting: Cardiology

## 2022-05-01 ENCOUNTER — Other Ambulatory Visit: Payer: Self-pay | Admitting: Oncology

## 2022-05-02 ENCOUNTER — Other Ambulatory Visit: Payer: Self-pay | Admitting: Radiology

## 2022-05-02 NOTE — Progress Notes (Signed)
Blood work was reviewed was reassuring for vasculitis.  Over inflammatory markers were elevated.  I received the skin biopsy results from dermatology.  Agree with there plan for repeat biopsy  He should continue to to follow up with GI and dermatology and start zyrtec '10mg'$  twice daily.

## 2022-05-03 ENCOUNTER — Encounter (HOSPITAL_COMMUNITY): Payer: Self-pay

## 2022-05-03 ENCOUNTER — Ambulatory Visit (HOSPITAL_COMMUNITY)
Admission: RE | Admit: 2022-05-03 | Discharge: 2022-05-03 | Disposition: A | Discharge status: Home / Self Care | Payer: Medicare HMO | Source: Ambulatory Visit | Attending: Nurse Practitioner | Admitting: Nurse Practitioner

## 2022-05-03 ENCOUNTER — Other Ambulatory Visit (HOSPITAL_COMMUNITY): Payer: Self-pay | Admitting: Interventional Radiology

## 2022-05-03 ENCOUNTER — Ambulatory Visit (HOSPITAL_COMMUNITY)
Admission: RE | Admit: 2022-05-03 | Discharge: 2022-05-03 | Disposition: A | Discharge status: Home / Self Care | Payer: Medicare HMO | Source: Ambulatory Visit | Attending: Interventional Radiology | Admitting: Interventional Radiology

## 2022-05-03 DIAGNOSIS — R188 Other ascites: Secondary | ICD-10-CM | POA: Diagnosis not present

## 2022-05-03 DIAGNOSIS — C168 Malignant neoplasm of overlapping sites of stomach: Secondary | ICD-10-CM | POA: Diagnosis present

## 2022-05-03 DIAGNOSIS — Z01818 Encounter for other preprocedural examination: Secondary | ICD-10-CM

## 2022-05-03 HISTORY — PX: IR IMAGING GUIDED PORT INSERTION: IMG5740

## 2022-05-03 MED ORDER — IOHEXOL 350 MG/ML SOLN
125.0000 mL | Freq: Once | INTRAVENOUS | Status: AC | PRN
  Administered 2022-05-03: 125 mL via INTRAVENOUS

## 2022-05-03 MED ORDER — MIDAZOLAM HCL 2 MG/2ML IJ SOLN
INTRAMUSCULAR | Status: AC | PRN
  Administered 2022-05-03: 1 mg via INTRAVENOUS

## 2022-05-03 MED ORDER — HEPARIN SOD (PORK) LOCK FLUSH 100 UNIT/ML IV SOLN
INTRAVENOUS | Status: AC
  Filled 2022-05-03: qty 5

## 2022-05-03 MED ORDER — MIDAZOLAM HCL 2 MG/2ML IJ SOLN
INTRAMUSCULAR | Status: AC
  Filled 2022-05-03: qty 2

## 2022-05-03 MED ORDER — LIDOCAINE HCL 1 % IJ SOLN
INTRAMUSCULAR | Status: AC
  Filled 2022-05-03: qty 20

## 2022-05-03 MED ORDER — SODIUM CHLORIDE 0.9 % IV SOLN: INTRAVENOUS | Status: DC

## 2022-05-03 NOTE — Progress Notes (Signed)
Todd Harrison, [place] notified of heart rate 114-130 and per [facility] call if heart rate greater than 100 at 1400

## 2022-05-03 NOTE — Sedation Documentation (Signed)
Attempted to call report to short stay, was told they would call us back.

## 2022-05-03 NOTE — Progress Notes (Signed)
Patient stated that he had an Ensure, coffee and a piece of toast this morning at 0730. Elta Guadeloupe, RN made aware. Mark, RN stated that the patient will still have his CT done and from there they will decide if they will proceed with the port.

## 2022-05-03 NOTE — Procedures (Signed)
Interventional Radiology Procedure Note  Procedure: Port placement.  Indication: Gastric Ca  Findings: Please refer to procedural dictation for full description.  Complications: None  EBL: < 10 mL  Miachel Roux, MD [phone]

## 2022-05-03 NOTE — Progress Notes (Signed)
Pharmacist Chemotherapy Monitoring - Initial Assessment    Anticipated start date: 05/04/22   The following has been reviewed per standard work regarding the patient's treatment regimen: The patient's diagnosis, treatment plan and drug doses, and organ/hematologic function Lab orders and baseline tests specific to treatment regimen  The treatment plan start date, drug sequencing, and pre-medications Prior authorization status  Patient's documented medication list, including drug-drug interaction screen and prescriptions for anti-emetics and supportive care specific to the treatment regimen The drug concentrations, fluid compatibility, administration routes, and timing of the medications to be used The patient's access for treatment and lifetime cumulative dose history, if applicable  The patient's medication allergies and previous infusion related reactions, if applicable   Changes made to treatment plan:  N/A  Follow up needed:  Pending authorization for treatment    Patrica Duel, [facility], 05/03/2022  4:07 PM

## 2022-05-03 NOTE — H&P (Signed)
Chief Complaint: Patient was seen in consultation today for  gastroesophageal cancer- port a cath placement at the request of Thomas,Lisa K  Referring Physician(s): Dr Darrold Junker  Supervising Physician: Mir, Sharen Heck  Patient Status: [facility] - Out-pt  History of Present Illness: Todd Harrison is a 76 y.o. male   Newly diagnosed gastroesophageal cancer Scheduled for chemotherapy tomorrow Here today for [facility] a cath placement per Dr Benay Spice  Dr Mir did order CTV  Chest this am to evaluate upper chest vessels Please see report of same  Past Medical History:  Diagnosis Date   A-fib ([place])    Asthma    Cataract    GERD (gastroesophageal reflux disease)    Lichen planus    Methotrexate, long term, current use    no longer taking   Psoriasis    Sleep apnea    Thyroid disease    Urticaria     Past Surgical History:  Procedure Laterality Date   CARDIOVERSION N/A 02/23/2020   Procedure: CARDIOVERSION;  Surgeon: Werner Lean, MD;  Location: [facility];  Service: Cardiovascular;  Laterality: N/A;   CARDIOVERSION N/A 04/12/2020   Procedure: CARDIOVERSION;  Surgeon: Buford Dresser, MD;  Location: [facility];  Service: Cardiovascular;  Laterality: N/A;   CATARACT EXTRACTION Left    2019   TONSILLECTOMY      Allergies: Gold and Mixed grasses  Medications: Prior to Admission medications   Medication Sig Start Date End Date Taking? Authorizing Provider  acetaminophen (TYLENOL) 500 MG tablet Take 500 mg by mouth every 6 (six) hours as needed for moderate pain.   Yes [provider]  clobetasol ointment (TEMOVATE) 7.16 % Apply 1 Application topically 2 (two) times daily as needed (rash). 08/18/20  Yes [provider]  ELIQUIS 5 MG TABS tablet TAKE 1 TABLET BY MOUTH TWICE A DAY 02/22/22  Yes Vickie Epley, MD  fluticasone [facility]) 50 MCG/ACT nasal spray Place 1 spray into both nostrils daily as needed for allergies or rhinitis.   Yes  [provider]  hydroxychloroquine (PLAQUENIL) 200 MG tablet Take 200 mg by mouth 2 (two) times daily. 06/17/21  Yes [provider]  ISOtretinoin (ACCUTANE) 40 MG capsule Take 40 mg by mouth 2 (two) times daily.  01/13/20  Yes [provider]  levothyroxine (SYNTHROID) 112 MCG tablet Take 112 mcg by mouth daily before breakfast.   Yes [provider]  metoprolol succinate (TOPROL-XL) 50 MG 24 hr tablet Take 1 tablet (50 mg total) by mouth daily. 05/02/22  Yes Vickie Epley, MD  omeprazole (PRILOSEC) 40 MG capsule Take 40 mg by mouth daily. 04/12/22  Yes [provider]  ondansetron (ZOFRAN) 8 MG tablet Take 1 tablet (8 mg total) by mouth every 8 (eight) hours as needed for nausea or vomiting. 04/17/22  Yes Ladell Pier, MD  polyethylene glycol (MIRALAX / GLYCOLAX) 17 g packet Take 17 g by mouth daily.   Yes [provider]  predniSONE (DELTASONE) 20 MG tablet Take 40 mg daily for 3 days, then 20 mg daily for 3 days, then 10 mg daily if rash recurs Patient taking differently: Take 20 mg by mouth daily. 04/26/22  Yes Owens Shark, NP  rosuvastatin (CRESTOR) 10 MG tablet Take 1 tablet (10 mg total) by mouth daily. Please contact our office to schedule an overdue appointment with Dr. Quentin Ore for any future refills. [phone]. Thank you. Final attempt 04/27/22  Yes Vickie Epley, MD  traMADol Veatrice Bourbon) 50 MG  tablet Take 1 tablet (50 mg total) by mouth every 6 (six) hours as needed. 04/14/22  Yes Ladell Pier, MD  triamcinolone (KENALOG) 0.1 % Apply 1 application topically daily as needed (lichen planus flare).   Yes [provider]  hydrOXYzine (ATARAX) 10 MG tablet Take 1 tablet (10 mg total) by mouth 3 (three) times daily as needed. 04/14/22   Ladell Pier, MD  lidocaine-prilocaine (EMLA) cream Apply 1 Application topically as needed (Apply 1 hour before coming to [facility] for accessing). 04/27/22   Ladell Pier,  MD  prochlorperazine (COMPAZINE) 10 MG tablet Take 1 tablet (10 mg total) by mouth every 6 (six) hours as needed for nausea or vomiting. 04/27/22   Ladell Pier, MD     Family History  Problem Relation Age of Onset   Diabetes Mother    Stroke Mother    Leukemia Maternal Great-grandmother    Allergic rhinitis Neg Hx    Asthma Neg Hx    Eczema Neg Hx    Urticaria Neg Hx    Colon cancer Neg Hx    Esophageal cancer Neg Hx    Stomach cancer Neg Hx     Social History   Socioeconomic History   Marital status: Married    Spouse name: Not on file   Number of children: Not on file   Years of education: Not on file   Highest education level: Not on file  Occupational History   Not on file  Tobacco Use   Smoking status: Former    Types: Cigarettes    Quit date: 1982    Years since quitting: 42.0   Smokeless tobacco: Never  Vaping Use   Vaping Use: Not on file  Substance and Sexual Activity   Alcohol use: Yes    Alcohol/week: 1.0 standard drink of alcohol    Types: 1 Glasses of wine per week    Comment: occasionally   Drug use: No   Sexual activity: Not on file  Other Topics Concern   Not on file  Social History Narrative   Married with 1 son and 1 daughter   Former smoker no alcohol no caffeine no tobacco or drug use   Social Determinants of Health   Financial Resource Strain: Low Risk  (04/17/2022)   Overall Financial Resource Strain (CARDIA)    Difficulty of Paying Living Expenses: Not hard at all  Food Insecurity: No Food Insecurity (04/17/2022)   Hunger Vital Sign    Worried About Running Out of Food in the Last Year: Never true    [place] in the Last Year: Never true  Transportation Needs: No Transportation Needs (04/17/2022)   PRAPARE - Hydrologist (Medical): No    Lack of Transportation (Non-Medical): No  Physical Activity: Not on file  Stress: No Stress Concern Present (04/17/2022)   [place]    Feeling of Stress : Not at all  Social Connections: Not on file    Review of Systems: A 12 point ROS discussed and pertinent positives are indicated in the HPI above.  All other systems are negative.  Review of Systems  Constitutional:  Positive for appetite change. Negative for activity change and fatigue.  Respiratory:  Negative for cough and shortness of breath.   Cardiovascular:  Negative for chest pain.  Gastrointestinal:  Negative for abdominal pain.  Psychiatric/Behavioral:  Negative for behavioral problems and confusion.  Vital Signs: BP 101/70   Pulse 76   Temp 97.6 F (36.4 C) (Temporal)   Resp 18   Ht 6' (1.829 m)   Wt 267 lb (121.1 kg)   SpO2 96%   BMI 36.21 kg/m     Physical Exam Vitals reviewed.  HENT:     Mouth/Throat:     Mouth: Mucous membranes are moist.  Cardiovascular:     Rate and Rhythm: Normal rate and regular rhythm.     Heart sounds: Normal heart sounds.  Pulmonary:     Effort: Pulmonary effort is normal.     Breath sounds: Normal breath sounds.  Abdominal:     Palpations: Abdomen is soft.  Musculoskeletal:        General: Normal range of motion.  Skin:    General: Skin is warm.  Neurological:     Mental Status: He is alert and oriented to person, place, and time.  Psychiatric:        Behavior: Behavior normal.     Imaging: CT ABDOMINAL MASS BIOPSY  Result Date: 04/26/2022 INDICATION: Suspected colon cancer with peritoneal carcinomatosis. EXAM: Procedures: 1. CT-GUIDED OMENTAL MASS BIOPSY 2. IMAGED-GUIDED PARACENTESIS COMPARISON:  None Available. MEDICATIONS: None. ANESTHESIA/SEDATION: Moderate (conscious) sedation was employed during this procedure. A total of Versed 1.5 mg and Fentanyl 75 mcg was administered intravenously. Moderate Sedation Time: 33 minutes. The patient's level of consciousness and vital signs were monitored continuously by radiology nursing throughout the procedure  under my direct supervision. CONTRAST:  None. COMPLICATIONS: None immediate. PROCEDURE: RADIATION DOSE REDUCTION: This exam was performed according to the departmental dose-optimization program which includes automated exposure control, adjustment of the mA and/or kV according to patient size and/or use of iterative reconstruction technique. Informed consent was obtained from the patient following an explanation of the procedure, risks, benefits and alternatives. A time out was performed prior to the initiation of the procedure. The patient was positioned supine on the CT table and a limited CT was performed for procedural planning demonstrating omental thickening and small-to-moderate volume intra-abdominal ascites. The procedure was planned. The procedure began with paracentesis. Initial ultrasound scanning demonstrates a pocket of ascites within the RIGHT lower abdominal quadrant. The RIGHT lower abdomen was prepped and draped in the usual sterile fashion. 1% lidocaine with epinephrine was used for local anesthesia. An ultrasound image was saved for documentation purposed. An 8 Fr Safe-T-Centesis catheter was introduced. The paracentesis was performed. The catheter was removed and a dressing was applied. A total of approximately 700 mL of serous peritoneal fluid was removed. Samples were sent to the laboratory as requested by the clinical team. For the omental mass biopsy, the operative site was prepped and draped in the usual sterile fashion. Appropriate trajectory was confirmed with a 22 gauge spinal needle after the adjacent tissues were anesthetized with 1% Lidocaine with epinephrine. Under intermittent CT guidance, a 17 gauge coaxial needle was advanced into the peripheral aspect of the mass. Appropriate positioning was confirmed and 4 samples were obtained with an 18 gauge core needle biopsy device. The co-axial needle was removed and hemostasis was achieved with manual compression. A limited postprocedural  CT was negative for hemorrhage or additional complication. A dressing was placed. The patient tolerated the procedure well without immediate postprocedural complication. IMPRESSION: Successful CT guided core needle biopsy of omental caking and diagnostic and therapeutic paracentesis, as above. Michaelle Birks, MD [facility] Radiology Electronically Signed   By: Michaelle Birks M.D.   On:  04/26/2022 10:16   CT Paracentesis  Result Date: 04/26/2022 INDICATION: Suspected colon cancer with peritoneal carcinomatosis. EXAM: Procedures: 1. CT-GUIDED OMENTAL MASS BIOPSY 2. IMAGED-GUIDED PARACENTESIS COMPARISON:  None Available. MEDICATIONS: None. ANESTHESIA/SEDATION: Moderate (conscious) sedation was employed during this procedure. A total of Versed 1.5 mg and Fentanyl 75 mcg was administered intravenously. Moderate Sedation Time: 33 minutes. The patient's level of consciousness and vital signs were monitored continuously by radiology nursing throughout the procedure under my direct supervision. CONTRAST:  None. COMPLICATIONS: None immediate. PROCEDURE: RADIATION DOSE REDUCTION: This exam was performed according to the departmental dose-optimization program which includes automated exposure control, adjustment of the mA and/or kV according to patient size and/or use of iterative reconstruction technique. Informed consent was obtained from the patient following an explanation of the procedure, risks, benefits and alternatives. A time out was performed prior to the initiation of the procedure. The patient was positioned supine on the CT table and a limited CT was performed for procedural planning demonstrating omental thickening and small-to-moderate volume intra-abdominal ascites. The procedure was planned. The procedure began with paracentesis. Initial ultrasound scanning demonstrates a pocket of ascites within the RIGHT lower abdominal quadrant. The RIGHT lower abdomen was  prepped and draped in the usual sterile fashion. 1% lidocaine with epinephrine was used for local anesthesia. An ultrasound image was saved for documentation purposed. An 8 Fr Safe-T-Centesis catheter was introduced. The paracentesis was performed. The catheter was removed and a dressing was applied. A total of approximately 700 mL of serous peritoneal fluid was removed. Samples were sent to the laboratory as requested by the clinical team. For the omental mass biopsy, the operative site was prepped and draped in the usual sterile fashion. Appropriate trajectory was confirmed with a 22 gauge spinal needle after the adjacent tissues were anesthetized with 1% Lidocaine with epinephrine. Under intermittent CT guidance, a 17 gauge coaxial needle was advanced into the peripheral aspect of the mass. Appropriate positioning was confirmed and 4 samples were obtained with an 18 gauge core needle biopsy device. The co-axial needle was removed and hemostasis was achieved with manual compression. A limited postprocedural CT was negative for hemorrhage or additional complication. A dressing was placed. The patient tolerated the procedure well without immediate postprocedural complication. IMPRESSION: Successful CT guided core needle biopsy of omental caking and diagnostic and therapeutic paracentesis, as above. Michaelle Birks, MD [facility] Radiology Electronically Signed   By: Michaelle Birks M.D.   On: 04/26/2022 10:16   CT Abdomen Pelvis W Contrast  Result Date: 04/13/2022 CLINICAL DATA:  Abdominal pain, acute, nonlocalized. Epigastric pain and vomiting after eating for 2 weeks. EXAM: CT ABDOMEN AND PELVIS WITH CONTRAST TECHNIQUE: Multidetector CT imaging of the abdomen and pelvis was performed using the standard protocol following bolus administration of intravenous contrast. RADIATION DOSE REDUCTION: This exam was performed according to the departmental dose-optimization  program which includes automated exposure control, adjustment of the mA and/or kV according to patient size and/or use of iterative reconstruction technique. CONTRAST:  129m OMNIPAQUE IOHEXOL 300 MG/ML  SOLN COMPARISON:  Limited correlation made with noncontrast chest CT 08/11/2020. FINDINGS: Lower chest: New small left greater than right pleural effusions with associated dependent atelectasis in both lower lobes. No suspicious pulmonary nodularity. Aortic and coronary artery atherosclerosis with calcifications of the aortic valve. Hepatobiliary: The liver is normal in density without suspicious focal abnormality. Probable incidental venous malformation involving the right hepatic and portal veins. No evidence of gallstones, gallbladder wall thickening or biliary  dilatation. Pancreas: Unremarkable. No pancreatic ductal dilatation or surrounding inflammatory changes. Spleen: Normal in size without focal abnormality. Adrenals/Urinary Tract: Both adrenal glands appear normal. No evidence of urinary tract calculus, suspicious renal lesion or hydronephrosis. The bladder appears unremarkable for its degree of distention. Stomach/Bowel: No enteric contrast administered. The stomach appears unremarkable for its degree of distention. The small bowel is decompressed without apparent wall thickening or surrounding inflammation. There is a possible nearly circumferential mass involving the distal transverse colon (image 36/2). No evidence of bowel obstruction or perforation. There are diverticular changes in the sigmoid colon. Vascular/Lymphatic: No discretely enlarged abdominopelvic lymph nodes are identified. There is aortic and branch vessel atherosclerosis without evidence of aneurysm or large vessel occlusion. The portal, superior mesenteric and splenic veins are patent. Reproductive: The prostate gland and seminal vesicles appear normal. Other: Moderate amount of ascites with diffuse omental and peritoneal nodularity  highly worrisome for peritoneal carcinomatosis. Possible 1.8 cm soft tissue mass between the bladder and sigmoid colon on image 73/2. No pneumoperitoneum. Musculoskeletal: No acute or significant osseous findings. IMPRESSION: 1. Moderate amount of ascites with diffuse omental and peritoneal nodularity highly worrisome for peritoneal carcinomatosis. Recommend diagnostic paracentesis. 2. Possible circumferential mass involving the transverse colon which could reflect a primary colonic malignancy. No other primary malignancy identified in the abdomen or pelvis. No evidence of bowel obstruction or perforation. 3. New small left greater than right pleural effusions with associated dependent atelectasis in both lower lobes. No suspicious pulmonary nodularity identified. 4. Probable incidental venous malformation involving the right hepatic and portal veins. 5.  Aortic Atherosclerosis (ICD10-I70.0). Electronically Signed   By: Richardean Sale M.D.   On: 04/13/2022 12:14    Labs:  CBC: Recent Labs    04/13/22 0859 04/24/22 0915  WBC 8.0 13.7*  HGB 12.4* 12.4*  HCT 38.4* 37.9*  PLT 230 316    COAGS: Recent Labs    04/24/22 0915  INR 1.1    BMP: Recent Labs    04/13/22 0859  NA 141  K 4.0  CL 104  CO2 26  GLUCOSE 95  BUN 12  CALCIUM 9.1  CREATININE 0.99  GFRNONAA >60    LIVER FUNCTION TESTS: Recent Labs    04/13/22 0859  BILITOT 0.5  AST 16  ALT 10  ALKPHOS 76  PROT 6.1*  ALBUMIN 3.4*    TUMOR MARKERS: No results for input(s): "AFPTM", "CEA", "CA199", "CHROMGRNA" in the last 8760 hours.  Assessment and Plan:  Gastroesophageal cancer To start chemo in am Scheduled for Port a cath placement today Risks and benefits of image guided port-a-catheter placement was discussed with the patient including, but not limited to bleeding, infection, pneumothorax, or fibrin sheath development and need for additional procedures.  All of the patient's questions were answered, patient  is agreeable to proceed. Consent signed and in chart.  Thank you for this interesting consult.  I greatly enjoyed meeting PAULINE TRAINER and look forward to participating in their care.  A copy of this report was sent to the requesting provider on this date.  Electronically Signed: Lavonia Drafts, PA-C 05/03/2022, 11:36 AM   I spent a total of  30 Minutes   in face to face in clinical consultation, greater than 50% of which was counseling/coordinating care for [facility] placement

## 2022-05-03 NOTE — Sedation Documentation (Signed)
Attempted to call short stay for a second time and was told there was no one available to take report and would be called back.

## 2022-05-04 ENCOUNTER — Ambulatory Visit: Payer: Medicare HMO

## 2022-05-04 ENCOUNTER — Inpatient Hospital Stay: Payer: Medicare HMO

## 2022-05-04 ENCOUNTER — Ambulatory Visit: Payer: Medicare HMO | Admitting: Nurse Practitioner

## 2022-05-04 VITALS — BP 115/73 | HR 99 | Temp 97.9°F | Resp 18 | Ht 72.0 in | Wt 269.2 lb

## 2022-05-04 DIAGNOSIS — C168 Malignant neoplasm of overlapping sites of stomach: Secondary | ICD-10-CM

## 2022-05-04 DIAGNOSIS — Z5111 Encounter for antineoplastic chemotherapy: Secondary | ICD-10-CM | POA: Diagnosis not present

## 2022-05-04 LAB — CBC WITH DIFFERENTIAL (CANCER CENTER ONLY)
Abs Immature Granulocytes: 0.06 10*3/uL (ref 0.00–0.07)
Basophils Absolute: 0.1 10*3/uL (ref 0.0–0.1)
Basophils Relative: 1 %
Eosinophils Absolute: 0.1 10*3/uL (ref 0.0–0.5)
Eosinophils Relative: 1 %
HCT: 39.6 % (ref 39.0–52.0)
Hemoglobin: 12.4 g/dL — ABNORMAL LOW (ref 13.0–17.0)
Immature Granulocytes: 0 %
Lymphocytes Relative: 14 %
Lymphs Abs: 2 10*3/uL (ref 0.7–4.0)
MCH: 27.5 pg (ref 26.0–34.0)
MCHC: 31.3 g/dL (ref 30.0–36.0)
MCV: 87.8 fL (ref 80.0–100.0)
Monocytes Absolute: 0.9 10*3/uL (ref 0.1–1.0)
Monocytes Relative: 6 %
Neutro Abs: 10.9 10*3/uL — ABNORMAL HIGH (ref 1.7–7.7)
Neutrophils Relative %: 78 %
Platelet Count: 265 10*3/uL (ref 150–400)
RBC: 4.51 MIL/uL (ref 4.22–5.81)
RDW: 15.2 % (ref 11.5–15.5)
WBC Count: 14 10*3/uL — ABNORMAL HIGH (ref 4.0–10.5)
nRBC: 0 % (ref 0.0–0.2)

## 2022-05-04 LAB — CMP (CANCER CENTER ONLY)
ALT: 12 U/L (ref 0–44)
AST: 12 U/L — ABNORMAL LOW (ref 15–41)
Albumin: 3.4 g/dL — ABNORMAL LOW (ref 3.5–5.0)
Alkaline Phosphatase: 68 U/L (ref 38–126)
Anion gap: 9 (ref 5–15)
BUN: 18 mg/dL (ref 8–23)
CO2: 27 mmol/L (ref 22–32)
Calcium: 8.9 mg/dL (ref 8.9–10.3)
Chloride: 103 mmol/L (ref 98–111)
Creatinine: 0.99 mg/dL (ref 0.61–1.24)
GFR, Estimated: >60 mL/min (ref >60)
Glucose, Bld: 112 mg/dL — ABNORMAL HIGH (ref 70–99)
Potassium: 3.7 mmol/L (ref 3.5–5.1)
Sodium: 139 mmol/L (ref 135–145)
Total Bilirubin: 0.5 mg/dL (ref 0.3–1.2)
Total Protein: 6.2 g/dL — ABNORMAL LOW (ref 6.5–8.1)

## 2022-05-04 MED ORDER — FLUOROURACIL CHEMO INJECTION 2.5 GM/50ML
400.0000 mg/m2 | Freq: Once | INTRAVENOUS | Status: AC
  Administered 2022-05-04: 1000 mg via INTRAVENOUS
  Filled 2022-05-04: qty 20

## 2022-05-04 MED ORDER — PALONOSETRON HCL INJECTION 0.25 MG/5ML
0.2500 mg | Freq: Once | INTRAVENOUS | Status: AC
  Administered 2022-05-04: 0.25 mg via INTRAVENOUS
  Filled 2022-05-04: qty 5

## 2022-05-04 MED ORDER — OXALIPLATIN CHEMO INJECTION 100 MG/20ML: 85.0000 mg/m2 | Freq: Once | INTRAVENOUS | Status: DC

## 2022-05-04 MED ORDER — SODIUM CHLORIDE 0.9 % IV SOLN
2010.0000 mg/m2 | INTRAVENOUS | Status: DC
  Administered 2022-05-04: 5000 mg via INTRAVENOUS
  Filled 2022-05-04: qty 100

## 2022-05-04 MED ORDER — OXALIPLATIN CHEMO INJECTION 100 MG/20ML
81.0000 mg/m2 | Freq: Once | INTRAVENOUS | Status: AC
  Administered 2022-05-04: 200 mg via INTRAVENOUS
  Filled 2022-05-04: qty 40

## 2022-05-04 MED ORDER — SODIUM CHLORIDE 0.9 % IV SOLN
10.0000 mg | Freq: Once | INTRAVENOUS | Status: AC
  Administered 2022-05-04: 10 mg via INTRAVENOUS
  Filled 2022-05-04: qty 10

## 2022-05-04 MED ORDER — DEXTROSE 5 % IV SOLN: Freq: Once | INTRAVENOUS | Status: AC

## 2022-05-04 MED ORDER — LEUCOVORIN CALCIUM INJECTION 350 MG
400.0000 mg/m2 | Freq: Once | INTRAVENOUS | Status: AC
  Administered 2022-05-04: 992 mg via INTRAVENOUS
  Filled 2022-05-04: qty 49.6

## 2022-05-04 MED ORDER — LEUCOVORIN CALCIUM INJECTION 350 MG
400.0000 mg/m2 | Freq: Once | INTRAVENOUS | Status: DC
  Filled 2022-05-04: qty 49.6

## 2022-05-04 NOTE — Progress Notes (Signed)
This patient has completed his First Time Folfox with his 5FU pump connected. At time of discharge, he started complaining of feeling flushed and his HR is elevated. He did get Decadron IVPB and takes Prednisone at home. He denies any other concerns. He has a history of Afib and stated that he did have elevated HR after getting his port placed yesterday. We just wanted to know what you thought before we send him home. Message sent to Leander Rams, NP.

## 2022-05-04 NOTE — Progress Notes (Signed)
1500--Patient denied dizziness when standing. HR has returned back to close to his baseline. Per Ned Card, NP ok to release patient with HR close to baseline

## 2022-05-04 NOTE — Progress Notes (Signed)
Leander Rams, NP at chairside assessing patient.  Per Lattie Haw, continue to monitor patient for heart rate decrease.

## 2022-05-04 NOTE — Patient Instructions (Addendum)
[place]   The chemotherapy medication bag should finish at 46 hours, 96 hours, or 7 days. For example, if your pump is scheduled for 46 hours and it was put on at 4:00 p.m., it should finish at 2:00 p.m. the day it is scheduled to come off regardless of your appointment time.     Estimated time to finish at 11:30 Saturday, May 06, 2022.   If the display on your pump reads "Low Volume" and it is beeping, take the batteries out of the pump and come to the cancer center for it to be taken off.   If the pump alarms go off prior to the pump reading "Low Volume" then call [phone] and someone can assist you.  If the plunger comes out and the chemotherapy medication is leaking out, please use your home chemo spill kit to clean up the spill. Do NOT use paper towels or other household products.  If you have problems or questions regarding your pump, please call either [phone] (24 hours a day) or the cancer center Monday-Friday 8:00 a.m.- 4:30 p.m. at the clinic number and we will assist you. If you are unable to get assistance, then go to the nearest Emergency Department and ask the staff to contact the IV team for assistance.  Discharge Instructions: Thank you for choosing [facility] to provide your oncology and hematology care.   If you have a lab appointment with the [facility], please go directly to the [facility] and check in at the registration area.   Wear comfortable clothing and clothing appropriate for easy access to any Portacath or PICC line.   We strive to give you quality time with your provider. You may need to reschedule your appointment if you arrive late (15 or more minutes).  Arriving late affects you and other patients whose appointments are after yours.  Also, if you miss three or more appointments without notifying the office, you may be dismissed from the clinic at the provider's discretion.      For  prescription refill requests, have your pharmacy contact our office and allow 72 hours for refills to be completed.    Today you received the following chemotherapy and/or immunotherapy agents Oxaliplatin, Leucovorin, Fluorouracil.      To help prevent nausea and vomiting after your treatment, we encourage you to take your nausea medication as directed.  BELOW ARE SYMPTOMS THAT SHOULD BE REPORTED IMMEDIATELY: *FEVER GREATER THAN 100.4 F (38 C) OR HIGHER *CHILLS OR SWEATING *NAUSEA AND VOMITING THAT IS NOT CONTROLLED WITH YOUR NAUSEA MEDICATION *UNUSUAL SHORTNESS OF BREATH *UNUSUAL BRUISING OR BLEEDING *URINARY PROBLEMS (pain or burning when urinating, or frequent urination) *BOWEL PROBLEMS (unusual diarrhea, constipation, pain near the anus) TENDERNESS IN MOUTH AND THROAT WITH OR WITHOUT PRESENCE OF ULCERS (sore throat, sores in mouth, or a toothache) UNUSUAL RASH, SWELLING OR PAIN  UNUSUAL VAGINAL DISCHARGE OR ITCHING   Items with * indicate a potential emergency and should be followed up as soon as possible or go to the Emergency Department if any problems should occur.  Please show the CHEMOTHERAPY ALERT CARD or IMMUNOTHERAPY ALERT CARD at check-in to the Emergency Department and triage nurse.  Should you have questions after your visit or need to cancel or reschedule your appointment, please contact [facility]  Dept: [phone]  and follow the prompts.  Office hours are 8:00 a.m. to 4:30 p.m. Monday - Friday. Please note that voicemails  left after 4:00 p.m. may not be returned until the following business day.  We are closed weekends and major holidays. You have access to a nurse at all times for urgent questions. Please call the main number to the clinic Dept: [phone] and follow the prompts.   For any non-urgent questions, you may also contact your provider using MyChart. We now offer e-Visits for anyone 39 and older to request care online for  non-urgent symptoms. For details visit [email or web address].   Also download the MyChart app! Go to the app store, search "MyChart", open the app, select [facility], and log in with your MyChart username and password.  Implanted Port Insertion, Care After The following information offers guidance on how to care for yourself after your procedure. Your health care provider may also give you more specific instructions. If you have problems or questions, contact your health care provider. What can I expect after the procedure? After the procedure, it is common to have: Discomfort at the port insertion site. Bruising on the skin over the port. This should improve over 3-4 days. Follow these instructions at home: [facility] care After your port is placed, you will get a manufacturer's information card. The card has information about your port. Keep this card with you at all times. Take care of the port as told by your health care provider. Ask your health care provider if you or a family member can get training for taking care of the port at home. A home health care nurse will be be available to help care for the port. Make sure to remember what type of port you have. [facility] care     Follow instructions from your health care provider about how to take care of your port insertion site. Make sure you: Wash your hands with soap and water for at least 20 seconds before and after you change your bandage (dressing). If soap and water are not available, use hand sanitizer. Change your dressing as told by your health care provider. Leave stitches (sutures), skin glue, or adhesive strips in place. These skin closures may need to stay in place for 2 weeks or longer. If adhesive strip edges start to loosen and curl up, you may trim the loose edges. Do not remove adhesive strips completely unless your health care provider tells you to do that. Check your port insertion site every day for signs of infection.  Check for: Redness, swelling, or pain. Fluid or blood. Warmth. Pus or a bad smell. Activity Return to your normal activities as told by your health care provider. Ask your health care provider what activities are safe for you. You may have to avoid lifting. Ask your health care provider how much you can safely lift. General instructions Take over-the-counter and prescription medicines only as told by your health care provider. Do not take baths, swim, or use a hot tub until your health care provider approves. Ask your health care provider if you may take showers. You may only be allowed to take sponge baths. If you were given a sedative during the procedure, it can affect you for several hours. Do not drive or operate machinery until your health care provider says that it is safe. Wear a medical alert bracelet in case of an emergency. This will tell any health care providers that you have a port. Keep all follow-up visits. This is important. Contact a health care provider if: You cannot flush your port with saline as directed, or you cannot draw  blood from the port. You have a fever or chills. You have redness, swelling, or pain around your port insertion site. You have fluid or blood coming from your port insertion site. Your port insertion site feels warm to the touch. You have pus or a bad smell coming from the port insertion site. Get help right away if: You have chest pain or shortness of breath. You have bleeding from your port that you cannot control. These symptoms may be an emergency. Get help right away. Call 911. Do not wait to see if the symptoms will go away. Do not drive yourself to the hospital. Summary Take care of the port as told by your health care provider. Keep the manufacturer's information card with you at all times. Change your dressing as told by your health care provider. Contact a health care provider if you have a fever or chills or if you have redness,  swelling, or pain around your port insertion site. Keep all follow-up visits. This information is not intended to replace advice given to you by your health care provider. Make sure you discuss any questions you have with your health care provider. Document Revised: 10/26/2020 Document Reviewed: 10/26/2020 Elsevier Patient Education  [place].  Oxaliplatin Injection What is this medication? OXALIPLATIN (ox AL i PLA tin) treats some types of cancer. It works by slowing down the growth of cancer cells. This medicine may be used for other purposes; ask your health care provider or pharmacist if you have questions. COMMON BRAND NAME(S): Eloxatin What should I tell my care team before I take this medication? They need to know if you have any of these conditions: Heart disease History of irregular heartbeat or rhythm Liver disease Low blood cell levels (white cells, red cells, and platelets) Lung or breathing disease, such as asthma Take medications that treat or prevent blood clots Tingling of the fingers, toes, or other nerve disorder An unusual or allergic reaction to oxaliplatin, other medications, foods, dyes, or preservatives If you or your partner are pregnant or trying to get pregnant Breast-feeding How should I use this medication? This medication is injected into a vein. It is given by your care team in a hospital or clinic setting. Talk to your care team about the use of this medication in children. Special care may be needed. Overdosage: If you think you have taken too much of this medicine contact a poison control center or emergency room at once. NOTE: This medicine is only for you. Do not share this medicine with others. What if I miss a dose? Keep appointments for follow-up doses. It is important not to miss a dose. Call your care team if you are unable to keep an appointment. What may interact with this medication? Do not take this medication with any of the  following: Cisapride Dronedarone Pimozide Thioridazine This medication may also interact with the following: Aspirin and aspirin-like medications Certain medications that treat or prevent blood clots, such as warfarin, apixaban, dabigatran, and rivaroxaban Cisplatin Cyclosporine Diuretics Medications for infection, such as acyclovir, adefovir, amphotericin B, bacitracin, cidofovir, foscarnet, ganciclovir, gentamicin, pentamidine, vancomycin NSAIDs, medications for pain and inflammation, such as ibuprofen or naproxen Other medications that cause heart rhythm changes Pamidronate Zoledronic acid This list may not describe all possible interactions. Give your health care provider a list of all the medicines, herbs, non-prescription drugs, or dietary supplements you use. Also tell them if you smoke, drink alcohol, or use illegal drugs. Some items may interact with your  medicine. What should I watch for while using this medication? Your condition will be monitored carefully while you are receiving this medication. You may need blood work while taking this medication. This medication may make you feel generally unwell. This is not uncommon as chemotherapy can affect healthy cells as well as cancer cells. Report any side effects. Continue your course of treatment even though you feel ill unless your care team tells you to stop. This medication may increase your risk of getting an infection. Call your care team for advice if you get a fever, chills, sore throat, or other symptoms of a cold or flu. Do not treat yourself. Try to avoid being around people who are sick. Avoid taking medications that contain aspirin, acetaminophen, ibuprofen, naproxen, or ketoprofen unless instructed by your care team. These medications may hide a fever. Be careful brushing or flossing your teeth or using a toothpick because you may get an infection or bleed more easily. If you have any dental work done, tell your dentist  you are receiving this medication. This medication can make you more sensitive to cold. Do not drink cold drinks or use ice. Cover exposed skin before coming in contact with cold temperatures or cold objects. When out in cold weather wear warm clothing and cover your mouth and nose to warm the air that goes into your lungs. Tell your care team if you get sensitive to the cold. Talk to your care team if you or your partner are pregnant or think either of you might be pregnant. This medication can cause serious birth defects if taken during pregnancy and for 9 months after the last dose. A negative pregnancy test is required before starting this medication. A reliable form of contraception is recommended while taking this medication and for 9 months after the last dose. Talk to your care team about effective forms of contraception. Do not father a child while taking this medication and for 6 months after the last dose. Use a condom while having sex during this time period. Do not breastfeed while taking this medication and for 3 months after the last dose. This medication may cause infertility. Talk to your care team if you are concerned about your fertility. What side effects may I notice from receiving this medication? Side effects that you should report to your care team as soon as possible: Allergic reactions--skin rash, itching, hives, swelling of the face, lips, tongue, or throat Bleeding--bloody or black, tar-like stools, vomiting blood or brown material that looks like coffee grounds, red or dark brown urine, small red or purple spots on skin, unusual bruising or bleeding Dry cough, shortness of breath or trouble breathing Heart rhythm changes--fast or irregular heartbeat, dizziness, feeling faint or lightheaded, chest pain, trouble breathing Infection--fever, chills, cough, sore throat, wounds that don't heal, pain or trouble when passing urine, general feeling of discomfort or being unwell Liver  injury--right upper belly pain, loss of appetite, nausea, light-colored stool, dark yellow or brown urine, yellowing skin or eyes, unusual weakness or fatigue Low red blood cell level--unusual weakness or fatigue, dizziness, headache, trouble breathing Muscle injury--unusual weakness or fatigue, muscle pain, dark yellow or brown urine, decrease in amount of urine Pain, tingling, or numbness in the hands or feet Sudden and severe headache, confusion, change in vision, seizures, which may be signs of posterior reversible encephalopathy syndrome (PRES) Unusual bruising or bleeding Side effects that usually do not require medical attention (report to your care team if they continue or are  bothersome): Diarrhea Nausea Pain, redness, or swelling with sores inside the mouth or throat Unusual weakness or fatigue Vomiting This list may not describe all possible side effects. Call your doctor for medical advice about side effects. You may report side effects to FDA at 1-800-FDA-1088. Where should I keep my medication? This medication is given in a hospital or clinic. It will not be stored at home. NOTE: This sheet is a summary. It may not cover all possible information. If you have questions about this medicine, talk to your doctor, pharmacist, or health care provider.  2023 Elsevier/Gold Standard (2007-06-15 00:00:00)  Leucovorin Injection What is this medication? LEUCOVORIN (loo koe VOR in) prevents side effects from certain medications, such as methotrexate. It works by increasing folate levels. This helps protect healthy cells in your body. It may also be used to treat anemia caused by low levels of folate. It can also be used with fluorouracil, a type of chemotherapy, to treat colorectal cancer. It works by increasing the effects of fluorouracil in the body. This medicine may be used for other purposes; ask your health care provider or pharmacist if you have questions. What should I tell my care  team before I take this medication? They need to know if you have any of these conditions: Anemia from low levels of vitamin B12 in the blood An unusual or allergic reaction to leucovorin, folic acid, other medications, foods, dyes, or preservatives Pregnant or trying to get pregnant Breastfeeding How should I use this medication? This medication is injected into a vein or a muscle. It is given by your care team in a hospital or clinic setting. Talk to your care team about the use of this medication in children. Special care may be needed. Overdosage: If you think you have taken too much of this medicine contact a poison control center or emergency room at once. NOTE: This medicine is only for you. Do not share this medicine with others. What if I miss a dose? Keep appointments for follow-up doses. It is important not to miss your dose. Call your care team if you are unable to keep an appointment. What may interact with this medication? Capecitabine Fluorouracil Phenobarbital Phenytoin Primidone Trimethoprim;sulfamethoxazole This list may not describe all possible interactions. Give your health care provider a list of all the medicines, herbs, non-prescription drugs, or dietary supplements you use. Also tell them if you smoke, drink alcohol, or use illegal drugs. Some items may interact with your medicine. What should I watch for while using this medication? Your condition will be monitored carefully while you are receiving this medication. This medication may increase the side effects of 5-fluorouracil. Tell your care team if you have diarrhea or mouth sores that do not get better or that get worse. What side effects may I notice from receiving this medication? Side effects that you should report to your care team as soon as possible: Allergic reactions--skin rash, itching, hives, swelling of the face, lips, tongue, or throat This list may not describe all possible side effects. Call your  doctor for medical advice about side effects. You may report side effects to FDA at 1-800-FDA-1088. Where should I keep my medication? This medication is given in a hospital or clinic. It will not be stored at home. NOTE: This sheet is a summary. It may not cover all possible information. If you have questions about this medicine, talk to your doctor, pharmacist, or health care provider.  2023 Elsevier/Gold Standard (2021-09-27 00:00:00)  Fluorouracil Injection  What is this medication? FLUOROURACIL (flure oh YOOR a sil) treats some types of cancer. It works by slowing down the growth of cancer cells. This medicine may be used for other purposes; ask your health care provider or pharmacist if you have questions. COMMON BRAND NAME(S): Adrucil What should I tell my care team before I take this medication? They need to know if you have any of these conditions: Blood disorders Dihydropyrimidine dehydrogenase (DPD) deficiency Infection, such as chickenpox, cold sores, herpes Kidney disease Liver disease Poor nutrition Recent or ongoing radiation therapy An unusual or allergic reaction to fluorouracil, other medications, foods, dyes, or preservatives If you or your partner are pregnant or trying to get pregnant Breast-feeding How should I use this medication? This medication is injected into a vein. It is administered by your care team in a hospital or clinic setting. Talk to your care team about the use of this medication in children. Special care may be needed. Overdosage: If you think you have taken too much of this medicine contact a poison control center or emergency room at once. NOTE: This medicine is only for you. Do not share this medicine with others. What if I miss a dose? Keep appointments for follow-up doses. It is important not to miss your dose. Call your care team if you are unable to keep an appointment. What may interact with this medication? Do not take this medication  with any of the following: Live virus vaccines This medication may also interact with the following: Medications that treat or prevent blood clots, such as warfarin, enoxaparin, dalteparin This list may not describe all possible interactions. Give your health care provider a list of all the medicines, herbs, non-prescription drugs, or dietary supplements you use. Also tell them if you smoke, drink alcohol, or use illegal drugs. Some items may interact with your medicine. What should I watch for while using this medication? Your condition will be monitored carefully while you are receiving this medication. This medication may make you feel generally unwell. This is not uncommon as chemotherapy can affect healthy cells as well as cancer cells. Report any side effects. Continue your course of treatment even though you feel ill unless your care team tells you to stop. In some cases, you may be given additional medications to help with side effects. Follow all directions for their use. This medication may increase your risk of getting an infection. Call your care team for advice if you get a fever, chills, sore throat, or other symptoms of a cold or flu. Do not treat yourself. Try to avoid being around people who are sick. This medication may increase your risk to bruise or bleed. Call your care team if you notice any unusual bleeding. Be careful brushing or flossing your teeth or using a toothpick because you may get an infection or bleed more easily. If you have any dental work done, tell your dentist you are receiving this medication. Avoid taking medications that contain aspirin, acetaminophen, ibuprofen, naproxen, or ketoprofen unless instructed by your care team. These medications may hide a fever. Do not treat diarrhea with over the counter products. Contact your care team if you have diarrhea that lasts more than 2 days or if it is severe and watery. This medication can make you more sensitive to  the sun. Keep out of the sun. If you cannot avoid being in the sun, wear protective clothing and sunscreen. Do not use sun lamps, tanning beds, or tanning booths. Talk to your  care team if you or your partner wish to become pregnant or think you might be pregnant. This medication can cause serious birth defects if taken during pregnancy and for 3 months after the last dose. A reliable form of contraception is recommended while taking this medication and for 3 months after the last dose. Talk to your care team about effective forms of contraception. Do not father a child while taking this medication and for 3 months after the last dose. Use a condom while having sex during this time period. Do not breastfeed while taking this medication. This medication may cause infertility. Talk to your care team if you are concerned about your fertility. What side effects may I notice from receiving this medication? Side effects that you should report to your care team as soon as possible: Allergic reactions--skin rash, itching, hives, swelling of the face, lips, tongue, or throat Heart attack--pain or tightness in the chest, shoulders, arms, or jaw, nausea, shortness of breath, cold or clammy skin, feeling faint or lightheaded Heart failure--shortness of breath, swelling of the ankles, feet, or hands, sudden weight gain, unusual weakness or fatigue Heart rhythm changes--fast or irregular heartbeat, dizziness, feeling faint or lightheaded, chest pain, trouble breathing High ammonia level--unusual weakness or fatigue, confusion, loss of appetite, nausea, vomiting, seizures Infection--fever, chills, cough, sore throat, wounds that don't heal, pain or trouble when passing urine, general feeling of discomfort or being unwell Low red blood cell level--unusual weakness or fatigue, dizziness, headache, trouble breathing Pain, tingling, or numbness in the hands or feet, muscle weakness, change in vision, confusion or trouble  speaking, loss of balance or coordination, trouble walking, seizures Redness, swelling, and blistering of the skin over hands and feet Severe or prolonged diarrhea Unusual bruising or bleeding Side effects that usually do not require medical attention (report to your care team if they continue or are bothersome): Dry skin Headache Increased tears Nausea Pain, redness, or swelling with sores inside the mouth or throat Sensitivity to light Vomiting This list may not describe all possible side effects. Call your doctor for medical advice about side effects. You may report side effects to FDA at 1-800-FDA-1088. Where should I keep my medication? This medication is given in a hospital or clinic. It will not be stored at home. NOTE: This sheet is a summary. It may not cover all possible information. If you have questions about this medicine, talk to your doctor, pharmacist, or health care provider.  2023 [employer] (2021-08-23 00:00:00)

## 2022-05-04 NOTE — Progress Notes (Signed)
Spoke with Dr Benay Spice. He believes it could be the steroid. He is ok with the pulse. Just monitor make sure he can walk around with out difficulty offer him something to eat/drink.

## 2022-05-05 ENCOUNTER — Telehealth: Payer: Self-pay | Admitting: Emergency Medicine

## 2022-05-05 NOTE — Telephone Encounter (Signed)
24 Hour Callback 24 hour callback post first time Folfox infusion.  Pt reports that he is doing well. Small nausea last evening, took compazine, all is well now.  Some cold sensitivity when he placed his hand in cool water.  Monitored his pulse throughout the evening.  Had elevated pulse and documented. High of 117 blood pressure at baseline.  Pt did not take his ordered prednisone this morning and heart rate 68.  Pt reports eating and drinking well. Had no questions or concerns.  Pt will call with any questions or concerns.

## 2022-05-06 ENCOUNTER — Inpatient Hospital Stay: Payer: Medicare HMO

## 2022-05-06 VITALS — BP 115/63 | HR 81 | Temp 97.5°F | Resp 20

## 2022-05-06 DIAGNOSIS — C168 Malignant neoplasm of overlapping sites of stomach: Secondary | ICD-10-CM

## 2022-05-06 DIAGNOSIS — Z5111 Encounter for antineoplastic chemotherapy: Secondary | ICD-10-CM | POA: Diagnosis not present

## 2022-05-06 MED ORDER — SODIUM CHLORIDE 0.9% FLUSH
10.0000 mL | INTRAVENOUS | Status: DC | PRN
  Administered 2022-05-06: 10 mL

## 2022-05-06 MED ORDER — HEPARIN SOD (PORK) LOCK FLUSH 100 UNIT/ML IV SOLN
500.0000 [IU] | Freq: Once | INTRAVENOUS | Status: AC | PRN
  Administered 2022-05-06: 500 [IU]

## 2022-05-06 NOTE — Progress Notes (Signed)
Pt came in today for pump d/c huber needle removed site healing well with ecchymosis and irritation from dressing. Pt to use sensitive dressing.

## 2022-05-06 NOTE — Patient Instructions (Signed)

## 2022-05-09 ENCOUNTER — Telehealth: Payer: Self-pay | Admitting: *Deleted

## 2022-05-09 NOTE — Telephone Encounter (Signed)
Called Mr. Jacinto to f/u on his call over holiday to answering service and was told to call 911. He did call 911 and by the time they arrived his BP was better and pulse was back down to his normal. Today on arrival his pulse was ~128, but few minutes later back down to < 100. Instructed him to push oral fluids and to call his cardiologist if this happens again due to having A. Fib and made him aware this can come and go.

## 2022-05-10 ENCOUNTER — Other Ambulatory Visit: Payer: Self-pay | Admitting: Cardiology

## 2022-05-11 ENCOUNTER — Other Ambulatory Visit: Payer: Self-pay

## 2022-05-11 DIAGNOSIS — C168 Malignant neoplasm of overlapping sites of stomach: Secondary | ICD-10-CM

## 2022-05-14 ENCOUNTER — Other Ambulatory Visit: Payer: Self-pay | Admitting: Oncology

## 2022-05-14 DIAGNOSIS — C168 Malignant neoplasm of overlapping sites of stomach: Secondary | ICD-10-CM

## 2022-05-17 ENCOUNTER — Encounter: Payer: Self-pay | Admitting: Nurse Practitioner

## 2022-05-17 ENCOUNTER — Inpatient Hospital Stay: Payer: Medicare HMO | Attending: Oncology

## 2022-05-17 ENCOUNTER — Inpatient Hospital Stay: Payer: Medicare HMO | Admitting: Nutrition

## 2022-05-17 ENCOUNTER — Inpatient Hospital Stay: Payer: Medicare HMO

## 2022-05-17 ENCOUNTER — Inpatient Hospital Stay: Payer: Medicare HMO | Admitting: Nurse Practitioner

## 2022-05-17 ENCOUNTER — Other Ambulatory Visit: Payer: Self-pay | Admitting: Cardiology

## 2022-05-17 VITALS — BP 116/74 | HR 86 | Temp 97.9°F | Resp 18 | Ht 72.0 in | Wt 272.2 lb

## 2022-05-17 VITALS — BP 112/67 | HR 75

## 2022-05-17 DIAGNOSIS — E039 Hypothyroidism, unspecified: Secondary | ICD-10-CM | POA: Diagnosis not present

## 2022-05-17 DIAGNOSIS — L439 Lichen planus, unspecified: Secondary | ICD-10-CM | POA: Insufficient documentation

## 2022-05-17 DIAGNOSIS — C168 Malignant neoplasm of overlapping sites of stomach: Secondary | ICD-10-CM

## 2022-05-17 DIAGNOSIS — R188 Other ascites: Secondary | ICD-10-CM | POA: Diagnosis not present

## 2022-05-17 DIAGNOSIS — E876 Hypokalemia: Secondary | ICD-10-CM | POA: Diagnosis not present

## 2022-05-17 DIAGNOSIS — Z5111 Encounter for antineoplastic chemotherapy: Secondary | ICD-10-CM | POA: Insufficient documentation

## 2022-05-17 DIAGNOSIS — R21 Rash and other nonspecific skin eruption: Secondary | ICD-10-CM | POA: Insufficient documentation

## 2022-05-17 DIAGNOSIS — K648 Other hemorrhoids: Secondary | ICD-10-CM | POA: Insufficient documentation

## 2022-05-17 DIAGNOSIS — C169 Malignant neoplasm of stomach, unspecified: Secondary | ICD-10-CM | POA: Diagnosis not present

## 2022-05-17 LAB — CMP (CANCER CENTER ONLY)
ALT: 10 U/L (ref 0–44)
AST: 14 U/L — ABNORMAL LOW (ref 15–41)
Albumin: 3.3 g/dL — ABNORMAL LOW (ref 3.5–5.0)
Alkaline Phosphatase: 69 U/L (ref 38–126)
Anion gap: 10 (ref 5–15)
BUN: 14 mg/dL (ref 8–23)
CO2: 25 mmol/L (ref 22–32)
Calcium: 8.8 mg/dL — ABNORMAL LOW (ref 8.9–10.3)
Chloride: 104 mmol/L (ref 98–111)
Creatinine: 0.97 mg/dL (ref 0.61–1.24)
GFR, Estimated: >60 mL/min (ref >60)
Glucose, Bld: 118 mg/dL — ABNORMAL HIGH (ref 70–99)
Potassium: 3.3 mmol/L — ABNORMAL LOW (ref 3.5–5.1)
Sodium: 139 mmol/L (ref 135–145)
Total Bilirubin: 0.4 mg/dL (ref 0.3–1.2)
Total Protein: 6.1 g/dL — ABNORMAL LOW (ref 6.5–8.1)

## 2022-05-17 LAB — CBC WITH DIFFERENTIAL (CANCER CENTER ONLY)
Abs Immature Granulocytes: 0.03 10*3/uL (ref 0.00–0.07)
Basophils Absolute: 0.1 10*3/uL (ref 0.0–0.1)
Basophils Relative: 1 %
Eosinophils Absolute: 0.1 10*3/uL (ref 0.0–0.5)
Eosinophils Relative: 1 %
HCT: 37 % — ABNORMAL LOW (ref 39.0–52.0)
Hemoglobin: 11.9 g/dL — ABNORMAL LOW (ref 13.0–17.0)
Immature Granulocytes: 0 %
Lymphocytes Relative: 21 %
Lymphs Abs: 2.1 10*3/uL (ref 0.7–4.0)
MCH: 27.6 pg (ref 26.0–34.0)
MCHC: 32.2 g/dL (ref 30.0–36.0)
MCV: 85.8 fL (ref 80.0–100.0)
Monocytes Absolute: 0.7 10*3/uL (ref 0.1–1.0)
Monocytes Relative: 7 %
Neutro Abs: 6.8 10*3/uL (ref 1.7–7.7)
Neutrophils Relative %: 70 %
Platelet Count: 228 10*3/uL (ref 150–400)
RBC: 4.31 MIL/uL (ref 4.22–5.81)
RDW: 14.9 % (ref 11.5–15.5)
WBC Count: 9.7 10*3/uL (ref 4.0–10.5)
nRBC: 0 % (ref 0.0–0.2)

## 2022-05-17 MED ORDER — POTASSIUM CHLORIDE CRYS ER 20 MEQ PO TBCR: 20.0000 meq | EXTENDED_RELEASE_TABLET | Freq: Every day | ORAL | 1 refills | Status: DC

## 2022-05-17 MED ORDER — PALONOSETRON HCL INJECTION 0.25 MG/5ML
0.2500 mg | Freq: Once | INTRAVENOUS | Status: AC
  Administered 2022-05-17: 0.25 mg via INTRAVENOUS
  Filled 2022-05-17: qty 5

## 2022-05-17 MED ORDER — SODIUM CHLORIDE 0.9 % IV SOLN
5000.0000 mg | INTRAVENOUS | Status: DC
  Administered 2022-05-17: 5000 mg via INTRAVENOUS
  Filled 2022-05-17: qty 100

## 2022-05-17 MED ORDER — OXALIPLATIN CHEMO INJECTION 100 MG/20ML
200.0000 mg | Freq: Once | INTRAVENOUS | Status: AC
  Administered 2022-05-17: 200 mg via INTRAVENOUS
  Filled 2022-05-17: qty 40

## 2022-05-17 MED ORDER — FLUOROURACIL CHEMO INJECTION 2.5 GM/50ML
400.0000 mg/m2 | Freq: Once | INTRAVENOUS | Status: AC
  Administered 2022-05-17: 1000 mg via INTRAVENOUS
  Filled 2022-05-17: qty 20

## 2022-05-17 MED ORDER — DEXTROSE 5 % IV SOLN: Freq: Once | INTRAVENOUS | Status: AC

## 2022-05-17 MED ORDER — SODIUM CHLORIDE 0.9 % IV SOLN
10.0000 mg | Freq: Once | INTRAVENOUS | Status: AC
  Administered 2022-05-17: 10 mg via INTRAVENOUS
  Filled 2022-05-17: qty 1

## 2022-05-17 MED ORDER — LEUCOVORIN CALCIUM INJECTION 350 MG
400.0000 mg/m2 | Freq: Once | INTRAVENOUS | Status: AC
  Administered 2022-05-17: 992 mg via INTRAVENOUS
  Filled 2022-05-17: qty 49.6

## 2022-05-17 NOTE — Progress Notes (Signed)
Patient seen by Ned Card NP today  Vitals are within treatment parameters.  Labs reviewed by Ned Card NP CBC diff reviewed and within treatment parameters, CMP pending.  Per physician team, patient is ready for treatment and there are NO modifications to the treatment plan. Per Ned Card, NP wait for CMP results prior to proceeding with tx.

## 2022-05-17 NOTE — Progress Notes (Signed)
Nutrition follow up completed with patient during infusion for Gastric cancer/carcinomatosis. Patient is receiving Cycle 2 of FOLFOX.  Weight documented as 272# 3.2 oz.  Patient noted to have trace bilateral lower extremity edema and distended abdomen.  Labs include K 3.3, Glucose 118 and Albumin 3.3. K-dur added.  Patient appears to have tolerated cycle 1 well and denied nausea, vomiting and mouth sores. States he drinks 2 cartons of Ensure Complete daily (350 kcal, 30 gm Pro each). He can eat almost anything as long as he only consumes small amounts of food at a time. He has tried to use protein powder in soups but it clumped up.   Nutrition Diagnosis: Unintended wt loss unable to be assessed secondary to fluid retention.  Intervention: Continue Ensure Complete 2 times daily. Continue small amounts of high protein foods throughout the day. Educated on strategies for using protein powders mixed with cold foods in small amounts and directions for adding it to warm foods. Provided shake/smoothie recipes.  Monitoring, Evaluation, Goals: Patient will tolerate adequate calories and protein to minimize loss of lean body mass.  Next Visit:Wednesday, Jan 24, during infusion.

## 2022-05-17 NOTE — Progress Notes (Signed)
  [facility] OFFICE PROGRESS NOTE   Diagnosis:  Gastric cancer  INTERVAL HISTORY:   Mr. Pritt returns as scheduled. He completed cycle 1 FOLFOX 05/04/2022.  He denies nausea/vomiting.  No mouth sores.  No diarrhea except to watery stools during the night last night.  He thinks this may be related to a sugar-free candy that he had during the day.  Cold sensitivity lasted 4 to 5 days.  No persistent neuropathy symptoms.  He reports developing hypotension and tachycardia late December/early January.  Paramedics came to his house.  He reports ER evaluation was recommended.  He decided to remain home and increase fluid intake.  Vital signs improved.    Objective:  Vital signs in last 24 hours:  Blood pressure 116/74, pulse 86, temperature 97.9 F (36.6 C), temperature source Oral, resp. rate 18, height 6' (1.829 m), weight 272 lb 3.2 oz (123.5 kg), SpO2 96 %.    HEENT: No thrush or ulcers. Resp: Lungs clear bilaterally. Cardio: Irregular. GI: Abdomen is distended.  No hepatosplenomegaly.  No mass. Vascular: Trace edema lower leg bilaterally. Skin: No significant rash. Port-A-Cath without erythema.   Lab Results:  Lab Results  Component Value Date   WBC 9.7 05/17/2022   HGB 11.9 (L) 05/17/2022   HCT 37.0 (L) 05/17/2022   MCV 85.8 05/17/2022   PLT 228 05/17/2022   NEUTROABS 6.8 05/17/2022    Imaging:  No results found.  Medications: I have reviewed the patient's current medications.  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  Internal hemorrhoids.  No specimens collected. Upper endoscopy 04/25/2022-large infiltrative mass with oozing bleeding and stigmata of recent bleeding in the gastric fundus, on the anterior wall of the gastric body and on the greater curvature of the gastric body; deformity in the gastric antrum.  Extrinsic deformity in the entire duodenum.  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 Her2 by IHC negative, 1+; MMR IHC normal; PD-L1 CPS 1%, positive.  Cycle 1 FOLFOX 05/04/2022 Cycle 2 FOLFOX 05/17/2022 Truncal rash-status post recent punch biopsy with pathology pending; biopsy reported negative per family 16/02/9603 Lichen planus of the feet Hypothyroidism Atrial fibrillation  Disposition: Mr. Lehrke appears stable.  He has completed 1 cycle of FOLFOX.  He tolerated without significant acute toxicity.  Plan to proceed with cycle 2 today as scheduled.  CBC and chemistry panel reviewed.  Labs adequate to proceed with treatment today as scheduled.  He has mild hypokalemia.  He will begin K-Dur 20 meq daily.  He will return for lab, follow-up, cycle 3 FOLFOX in 2 weeks.  We are available to see him in the future if needed.  He reports having follow-up with cardiology next month.  He will request a sooner appointment if he continues to have the episodes of tachycardia.    Ned Card ANP/GNP-BC   05/17/2022  8:50 AM

## 2022-05-17 NOTE — Patient Instructions (Addendum)
[place]   The chemotherapy medication bag should finish at 46 hours, 96 hours, or 7 days. For example, if your pump is scheduled for 46 hours and it was put on at 4:00 p.m., it should finish at 2:00 p.m. the day it is scheduled to come off regardless of your appointment time.     Estimated time to finish at 11:00 Friday, May 19, 2022.   If the display on your pump reads "Low Volume" and it is beeping, take the batteries out of the pump and come to the cancer center for it to be taken off.   If the pump alarms go off prior to the pump reading "Low Volume" then call [phone] and someone can assist you.  If the plunger comes out and the chemotherapy medication is leaking out, please use your home chemo spill kit to clean up the spill. Do NOT use paper towels or other household products.  If you have problems or questions regarding your pump, please call either [phone] (24 hours a day) or the cancer center Monday-Friday 8:00 a.m.- 4:30 p.m. at the clinic number and we will assist you. If you are unable to get assistance, then go to the nearest Emergency Department and ask the staff to contact the IV team for assistance.  Discharge Instructions: Thank you for choosing [facility] to provide your oncology and hematology care.   If you have a lab appointment with the [facility], please go directly to the [facility] and check in at the registration area.   Wear comfortable clothing and clothing appropriate for easy access to any Portacath or PICC line.   We strive to give you quality time with your provider. You may need to reschedule your appointment if you arrive late (15 or more minutes).  Arriving late affects you and other patients whose appointments are after yours.  Also, if you miss three or more appointments without notifying the office, you may be dismissed from the clinic at the provider's discretion.      For prescription  refill requests, have your pharmacy contact our office and allow 72 hours for refills to be completed.    Today you received the following chemotherapy and/or immunotherapy agents Oxaliplatin, Leucovorin, Fluorouracil.      To help prevent nausea and vomiting after your treatment, we encourage you to take your nausea medication as directed.  BELOW ARE SYMPTOMS THAT SHOULD BE REPORTED IMMEDIATELY: *FEVER GREATER THAN 100.4 F (38 C) OR HIGHER *CHILLS OR SWEATING *NAUSEA AND VOMITING THAT IS NOT CONTROLLED WITH YOUR NAUSEA MEDICATION *UNUSUAL SHORTNESS OF BREATH *UNUSUAL BRUISING OR BLEEDING *URINARY PROBLEMS (pain or burning when urinating, or frequent urination) *BOWEL PROBLEMS (unusual diarrhea, constipation, pain near the anus) TENDERNESS IN MOUTH AND THROAT WITH OR WITHOUT PRESENCE OF ULCERS (sore throat, sores in mouth, or a toothache) UNUSUAL RASH, SWELLING OR PAIN  UNUSUAL VAGINAL DISCHARGE OR ITCHING   Items with * indicate a potential emergency and should be followed up as soon as possible or go to the Emergency Department if any problems should occur.  Please show the CHEMOTHERAPY ALERT CARD or IMMUNOTHERAPY ALERT CARD at check-in to the Emergency Department and triage nurse.  Should you have questions after your visit or need to cancel or reschedule your appointment, please contact [facility]  Dept: [phone]  and follow the prompts.  Office hours are 8:00 a.m. to 4:30 p.m. Monday - Friday. Please note that voicemails  left after 4:00 p.m. may not be returned until the following business day.  We are closed weekends and major holidays. You have access to a nurse at all times for urgent questions. Please call the main number to the clinic Dept: [phone] and follow the prompts.   For any non-urgent questions, you may also contact your provider using MyChart. We now offer e-Visits for anyone 70 and older to request care online for non-urgent  symptoms. For details visit [email or web address].   Also download the MyChart app! Go to the app store, search "MyChart", open the app, select [facility], and log in with your MyChart username and password.  Oxaliplatin Injection What is this medication? OXALIPLATIN (ox AL i PLA tin) treats some types of cancer. It works by slowing down the growth of cancer cells. This medicine may be used for other purposes; ask your health care provider or pharmacist if you have questions. COMMON BRAND NAME(S): Eloxatin What should I tell my care team before I take this medication? They need to know if you have any of these conditions: Heart disease History of irregular heartbeat or rhythm Liver disease Low blood cell levels (white cells, red cells, and platelets) Lung or breathing disease, such as asthma Take medications that treat or prevent blood clots Tingling of the fingers, toes, or other nerve disorder An unusual or allergic reaction to oxaliplatin, other medications, foods, dyes, or preservatives If you or your partner are pregnant or trying to get pregnant Breast-feeding How should I use this medication? This medication is injected into a vein. It is given by your care team in a hospital or clinic setting. Talk to your care team about the use of this medication in children. Special care may be needed. Overdosage: If you think you have taken too much of this medicine contact a poison control center or emergency room at once. NOTE: This medicine is only for you. Do not share this medicine with others. What if I miss a dose? Keep appointments for follow-up doses. It is important not to miss a dose. Call your care team if you are unable to keep an appointment. What may interact with this medication? Do not take this medication with any of the following: Cisapride Dronedarone Pimozide Thioridazine This medication may also interact with the following: Aspirin and aspirin-like  medications Certain medications that treat or prevent blood clots, such as warfarin, apixaban, dabigatran, and rivaroxaban Cisplatin Cyclosporine Diuretics Medications for infection, such as acyclovir, adefovir, amphotericin B, bacitracin, cidofovir, foscarnet, ganciclovir, gentamicin, pentamidine, vancomycin NSAIDs, medications for pain and inflammation, such as ibuprofen or naproxen Other medications that cause heart rhythm changes Pamidronate Zoledronic acid This list may not describe all possible interactions. Give your health care provider a list of all the medicines, herbs, non-prescription drugs, or dietary supplements you use. Also tell them if you smoke, drink alcohol, or use illegal drugs. Some items may interact with your medicine. What should I watch for while using this medication? Your condition will be monitored carefully while you are receiving this medication. You may need blood work while taking this medication. This medication may make you feel generally unwell. This is not uncommon as chemotherapy can affect healthy cells as well as cancer cells. Report any side effects. Continue your course of treatment even though you feel ill unless your care team tells you to stop. This medication may increase your risk of getting an infection. Call your care team for advice if you get a fever, chills, sore throat, or  other symptoms of a cold or flu. Do not treat yourself. Try to avoid being around people who are sick. Avoid taking medications that contain aspirin, acetaminophen, ibuprofen, naproxen, or ketoprofen unless instructed by your care team. These medications may hide a fever. Be careful brushing or flossing your teeth or using a toothpick because you may get an infection or bleed more easily. If you have any dental work done, tell your dentist you are receiving this medication. This medication can make you more sensitive to cold. Do not drink cold drinks or use ice. Cover exposed  skin before coming in contact with cold temperatures or cold objects. When out in cold weather wear warm clothing and cover your mouth and nose to warm the air that goes into your lungs. Tell your care team if you get sensitive to the cold. Talk to your care team if you or your partner are pregnant or think either of you might be pregnant. This medication can cause serious birth defects if taken during pregnancy and for 9 months after the last dose. A negative pregnancy test is required before starting this medication. A reliable form of contraception is recommended while taking this medication and for 9 months after the last dose. Talk to your care team about effective forms of contraception. Do not father a child while taking this medication and for 6 months after the last dose. Use a condom while having sex during this time period. Do not breastfeed while taking this medication and for 3 months after the last dose. This medication may cause infertility. Talk to your care team if you are concerned about your fertility. What side effects may I notice from receiving this medication? Side effects that you should report to your care team as soon as possible: Allergic reactions--skin rash, itching, hives, swelling of the face, lips, tongue, or throat Bleeding--bloody or black, tar-like stools, vomiting blood or brown material that looks like coffee grounds, red or dark brown urine, small red or purple spots on skin, unusual bruising or bleeding Dry cough, shortness of breath or trouble breathing Heart rhythm changes--fast or irregular heartbeat, dizziness, feeling faint or lightheaded, chest pain, trouble breathing Infection--fever, chills, cough, sore throat, wounds that don't heal, pain or trouble when passing urine, general feeling of discomfort or being unwell Liver injury--right upper belly pain, loss of appetite, nausea, light-colored stool, dark yellow or brown urine, yellowing skin or eyes, unusual  weakness or fatigue Low red blood cell level--unusual weakness or fatigue, dizziness, headache, trouble breathing Muscle injury--unusual weakness or fatigue, muscle pain, dark yellow or brown urine, decrease in amount of urine Pain, tingling, or numbness in the hands or feet Sudden and severe headache, confusion, change in vision, seizures, which may be signs of posterior reversible encephalopathy syndrome (PRES) Unusual bruising or bleeding Side effects that usually do not require medical attention (report to your care team if they continue or are bothersome): Diarrhea Nausea Pain, redness, or swelling with sores inside the mouth or throat Unusual weakness or fatigue Vomiting This list may not describe all possible side effects. Call your doctor for medical advice about side effects. You may report side effects to FDA at 1-800-FDA-1088. Where should I keep my medication? This medication is given in a hospital or clinic. It will not be stored at home. NOTE: This sheet is a summary. It may not cover all possible information. If you have questions about this medicine, talk to your doctor, pharmacist, or health care provider.  2023 Elsevier/Gold Standard (  2007-06-15 00:00:00) Leucovorin Injection What is this medication? LEUCOVORIN (loo koe VOR in) prevents side effects from certain medications, such as methotrexate. It works by increasing folate levels. This helps protect healthy cells in your body. It may also be used to treat anemia caused by low levels of folate. It can also be used with fluorouracil, a type of chemotherapy, to treat colorectal cancer. It works by increasing the effects of fluorouracil in the body. This medicine may be used for other purposes; ask your health care provider or pharmacist if you have questions. What should I tell my care team before I take this medication? They need to know if you have any of these conditions: Anemia from low levels of vitamin B12 in the  blood An unusual or allergic reaction to leucovorin, folic acid, other medications, foods, dyes, or preservatives Pregnant or trying to get pregnant Breastfeeding How should I use this medication? This medication is injected into a vein or a muscle. It is given by your care team in a hospital or clinic setting. Talk to your care team about the use of this medication in children. Special care may be needed. Overdosage: If you think you have taken too much of this medicine contact a poison control center or emergency room at once. NOTE: This medicine is only for you. Do not share this medicine with others. What if I miss a dose? Keep appointments for follow-up doses. It is important not to miss your dose. Call your care team if you are unable to keep an appointment. What may interact with this medication? Capecitabine Fluorouracil Phenobarbital Phenytoin Primidone Trimethoprim;sulfamethoxazole This list may not describe all possible interactions. Give your health care provider a list of all the medicines, herbs, non-prescription drugs, or dietary supplements you use. Also tell them if you smoke, drink alcohol, or use illegal drugs. Some items may interact with your medicine. What should I watch for while using this medication? Your condition will be monitored carefully while you are receiving this medication. This medication may increase the side effects of 5-fluorouracil. Tell your care team if you have diarrhea or mouth sores that do not get better or that get worse. What side effects may I notice from receiving this medication? Side effects that you should report to your care team as soon as possible: Allergic reactions--skin rash, itching, hives, swelling of the face, lips, tongue, or throat This list may not describe all possible side effects. Call your doctor for medical advice about side effects. You may report side effects to FDA at 1-800-FDA-1088. Where should I keep my  medication? This medication is given in a hospital or clinic. It will not be stored at home. NOTE: This sheet is a summary. It may not cover all possible information. If you have questions about this medicine, talk to your doctor, pharmacist, or health care provider.  2023 Elsevier/Gold Standard (2021-09-27 00:00:00) Fluorouracil Injection What is this medication? FLUOROURACIL (flure oh YOOR a sil) treats some types of cancer. It works by slowing down the growth of cancer cells. This medicine may be used for other purposes; ask your health care provider or pharmacist if you have questions. COMMON BRAND NAME(S): Adrucil What should I tell my care team before I take this medication? They need to know if you have any of these conditions: Blood disorders Dihydropyrimidine dehydrogenase (DPD) deficiency Infection, such as chickenpox, cold sores, herpes Kidney disease Liver disease Poor nutrition Recent or ongoing radiation therapy An unusual or allergic reaction to fluorouracil, other  medications, foods, dyes, or preservatives If you or your partner are pregnant or trying to get pregnant Breast-feeding How should I use this medication? This medication is injected into a vein. It is administered by your care team in a hospital or clinic setting. Talk to your care team about the use of this medication in children. Special care may be needed. Overdosage: If you think you have taken too much of this medicine contact a poison control center or emergency room at once. NOTE: This medicine is only for you. Do not share this medicine with others. What if I miss a dose? Keep appointments for follow-up doses. It is important not to miss your dose. Call your care team if you are unable to keep an appointment. What may interact with this medication? Do not take this medication with any of the following: Live virus vaccines This medication may also interact with the following: Medications that treat or  prevent blood clots, such as warfarin, enoxaparin, dalteparin This list may not describe all possible interactions. Give your health care provider a list of all the medicines, herbs, non-prescription drugs, or dietary supplements you use. Also tell them if you smoke, drink alcohol, or use illegal drugs. Some items may interact with your medicine. What should I watch for while using this medication? Your condition will be monitored carefully while you are receiving this medication. This medication may make you feel generally unwell. This is not uncommon as chemotherapy can affect healthy cells as well as cancer cells. Report any side effects. Continue your course of treatment even though you feel ill unless your care team tells you to stop. In some cases, you may be given additional medications to help with side effects. Follow all directions for their use. This medication may increase your risk of getting an infection. Call your care team for advice if you get a fever, chills, sore throat, or other symptoms of a cold or flu. Do not treat yourself. Try to avoid being around people who are sick. This medication may increase your risk to bruise or bleed. Call your care team if you notice any unusual bleeding. Be careful brushing or flossing your teeth or using a toothpick because you may get an infection or bleed more easily. If you have any dental work done, tell your dentist you are receiving this medication. Avoid taking medications that contain aspirin, acetaminophen, ibuprofen, naproxen, or ketoprofen unless instructed by your care team. These medications may hide a fever. Do not treat diarrhea with over the counter products. Contact your care team if you have diarrhea that lasts more than 2 days or if it is severe and watery. This medication can make you more sensitive to the sun. Keep out of the sun. If you cannot avoid being in the sun, wear protective clothing and sunscreen. Do not use sun lamps,  tanning beds, or tanning booths. Talk to your care team if you or your partner wish to become pregnant or think you might be pregnant. This medication can cause serious birth defects if taken during pregnancy and for 3 months after the last dose. A reliable form of contraception is recommended while taking this medication and for 3 months after the last dose. Talk to your care team about effective forms of contraception. Do not father a child while taking this medication and for 3 months after the last dose. Use a condom while having sex during this time period. Do not breastfeed while taking this medication. This medication may cause  infertility. Talk to your care team if you are concerned about your fertility. What side effects may I notice from receiving this medication? Side effects that you should report to your care team as soon as possible: Allergic reactions--skin rash, itching, hives, swelling of the face, lips, tongue, or throat Heart attack--pain or tightness in the chest, shoulders, arms, or jaw, nausea, shortness of breath, cold or clammy skin, feeling faint or lightheaded Heart failure--shortness of breath, swelling of the ankles, feet, or hands, sudden weight gain, unusual weakness or fatigue Heart rhythm changes--fast or irregular heartbeat, dizziness, feeling faint or lightheaded, chest pain, trouble breathing High ammonia level--unusual weakness or fatigue, confusion, loss of appetite, nausea, vomiting, seizures Infection--fever, chills, cough, sore throat, wounds that don't heal, pain or trouble when passing urine, general feeling of discomfort or being unwell Low red blood cell level--unusual weakness or fatigue, dizziness, headache, trouble breathing Pain, tingling, or numbness in the hands or feet, muscle weakness, change in vision, confusion or trouble speaking, loss of balance or coordination, trouble walking, seizures Redness, swelling, and blistering of the skin over hands and  feet Severe or prolonged diarrhea Unusual bruising or bleeding Side effects that usually do not require medical attention (report to your care team if they continue or are bothersome): Dry skin Headache Increased tears Nausea Pain, redness, or swelling with sores inside the mouth or throat Sensitivity to light Vomiting This list may not describe all possible side effects. Call your doctor for medical advice about side effects. You may report side effects to FDA at 1-800-FDA-1088. Where should I keep my medication? This medication is given in a hospital or clinic. It will not be stored at home. NOTE: This sheet is a summary. It may not cover all possible information. If you have questions about this medicine, talk to your doctor, pharmacist, or health care provider.  2023 Elsevier/Gold Standard (2021-08-23 00:00:00)

## 2022-05-18 ENCOUNTER — Other Ambulatory Visit: Payer: Self-pay

## 2022-05-19 ENCOUNTER — Inpatient Hospital Stay: Payer: Medicare HMO

## 2022-05-19 ENCOUNTER — Encounter: Payer: Self-pay | Admitting: Cardiology

## 2022-05-19 ENCOUNTER — Telehealth: Payer: Self-pay | Admitting: Cardiology

## 2022-05-19 VITALS — BP 110/90 | HR 128 | Temp 98.5°F | Resp 18

## 2022-05-19 DIAGNOSIS — C168 Malignant neoplasm of overlapping sites of stomach: Secondary | ICD-10-CM

## 2022-05-19 DIAGNOSIS — Z5111 Encounter for antineoplastic chemotherapy: Secondary | ICD-10-CM | POA: Diagnosis not present

## 2022-05-19 DIAGNOSIS — I4891 Unspecified atrial fibrillation: Secondary | ICD-10-CM

## 2022-05-19 MED ORDER — SODIUM CHLORIDE 0.9% FLUSH
10.0000 mL | INTRAVENOUS | Status: DC | PRN
  Administered 2022-05-19: 10 mL

## 2022-05-19 MED ORDER — HEPARIN SOD (PORK) LOCK FLUSH 100 UNIT/ML IV SOLN
500.0000 [IU] | Freq: Once | INTRAVENOUS | Status: AC | PRN
  Administered 2022-05-19: 500 [IU]

## 2022-05-19 NOTE — Progress Notes (Signed)
Pt pulse rate at pump stop between 128-131. Pt reports that he has had problems most of his life but has noticed this happening again since beginning treatment for cancer.  Patient has been monitoring at home.  Patient advised to call his Cardiac Doctor today and discuss. Patient and wife in agreement

## 2022-05-19 NOTE — Telephone Encounter (Signed)
Called patient back about message. Patient complaining about his HR being elevated in the 130's recently after having 2nd series of chemo. Patient also has low SBP in the 90's. Patient stated he is no longer taking eliquis due to GI bleed. Patient stated his oncologist stopped his rosuvastatin. Patient stated he stopped hydroxychloroquine on his own and it help with his HR. Patient is over due for follow-up. Made patient an appointment to see Dr. Johney Frame next week. Patient state that is HR right now is 77, but it keeps going back up. Patient stated he is not in A. FIB because his HR feels regular.  Informed patient if he continues to have a low BP and elevated HR he should go to ED for evaluation. Will send to Dr. Johney Frame for advisement.

## 2022-05-19 NOTE — Patient Instructions (Signed)

## 2022-05-19 NOTE — Telephone Encounter (Signed)
  Pt is calling to f/u his mychart message regarding his pulse rate

## 2022-05-23 NOTE — Progress Notes (Deleted)
Cardiology Office Note:    Date:  05/23/2022   ID:  Todd Harrison, DOB 01/25/1946, MRN [id number]  PCP:  Lorene Dy, MD  [facility] Cardiologist:  Freada Bergeron, MD  [facility] Electrophysiologist:  Vickie Epley, MD   Referring MD: Lorene Dy, MD    History of Present Illness:    Todd Harrison is a 77 y.o. male with a hx of lichen planus on methotrexate and chronic prednisone, hypothyroidism, and newly diagnosed Afib with failed DCCV x2 (initial return to NSR on first DCCV but reverted back to Afib) who presents to clinic for follow-up.   Patient was diagnosed with left sided pneumonia and acute asthma exacerbation in 01/2020. He was treated with medrol dose pack and levaquin with modest improvement. He continued to have a productive cough and was following up with his pulmonologist when he was noted to be in Afib with RVR to 130s for which he was transferred to the ED.   During his hospitalization I September 2021, the patient was rate controlled initially with a dilt gtt later transitioned to cardizem and bystolic. He was initiated on eliquis for anticoagulation with plans to DCCV once he received 3 weeks of AC.   He returned for DCCV on 10/18 where he initially converted to NSR but rapidly returned back into Afib. At our follow-up visit on 03/11/20, he remained symptomatic in Afib. We loaded him with amiodarone and re-attempted DCCV on 12/06 but without success. He now returns to clinic for follow-up.  Was last seen by Dr. Quentin Ore on 04/2021 where he was doing well without recurrence of Afib. Amiodarone was stopped.   Today, ***  Past Medical History:  Diagnosis Date   A-fib ([place])    Asthma    Cataract    GERD (gastroesophageal reflux disease)    Lichen planus    Methotrexate, long term, current use    no longer taking   Psoriasis    Sleep apnea    Thyroid disease    Urticaria     Past Surgical History:  Procedure Laterality Date    CARDIOVERSION N/A 02/23/2020   Procedure: CARDIOVERSION;  Surgeon: Werner Lean, MD;  Location: [facility];  Service: Cardiovascular;  Laterality: N/A;   CARDIOVERSION N/A 04/12/2020   Procedure: CARDIOVERSION;  Surgeon: Buford Dresser, MD;  Location: [facility];  Service: Cardiovascular;  Laterality: N/A;   CATARACT EXTRACTION Left    2019   IR IMAGING GUIDED PORT INSERTION  05/03/2022   TONSILLECTOMY      Current Medications: No outpatient medications have been marked as taking for the 05/24/22 encounter (Appointment) with Freada Bergeron, MD.     Allergies:   Gold and Mixed grasses   Social History   Socioeconomic History   Marital status: Married    Spouse name: Not on file   Number of children: Not on file   Years of education: Not on file   Highest education level: Not on file  Occupational History   Not on file  Tobacco Use   Smoking status: Former    Types: Cigarettes    Quit date: 1982    Years since quitting: 42.0   Smokeless tobacco: Never  Vaping Use   Vaping Use: Not on file  Substance and Sexual Activity   Alcohol use: Yes    Alcohol/week: 1.0 standard drink of alcohol    Types: 1 Glasses of wine per week    Comment: occasionally   Drug use: No  Sexual activity: Not on file  Other Topics Concern   Not on file  Social History Narrative   Married with 1 son and 1 daughter   Former smoker no alcohol no caffeine no tobacco or drug use   Social Determinants of Health   Financial Resource Strain: Low Risk  (04/17/2022)   Overall Financial Resource Strain (CARDIA)    Difficulty of Paying Living Expenses: Not hard at all  Food Insecurity: No Food Insecurity (04/17/2022)   Hunger Vital Sign    Worried About Running Out of Food in the Last Year: Never true    Ran Out of Food in the Last Year: Never true  Transportation Needs: No Transportation Needs (04/17/2022)   PRAPARE - Hydrologist (Medical): No     Lack of Transportation (Non-Medical): No  Physical Activity: Not on file  Stress: No Stress Concern Present (04/17/2022)   [place]    Feeling of Stress : Not at all  Social Connections: Not on file     Family History: The patient's family history includes Diabetes in his mother; Leukemia in his maternal great-grandmother; Stroke in his mother. There is no history of Allergic rhinitis, Asthma, Eczema, Urticaria, Colon cancer, Esophageal cancer, or Stomach cancer.  ROS:   Please see the history of present illness.    Review of Systems  Constitutional:  Negative for chills and fever.  HENT:  Negative for congestion.   Eyes:  Negative for blurred vision.  Respiratory:  Positive for cough. Negative for sputum production and shortness of breath.   Cardiovascular:  Positive for leg swelling. Negative for chest pain, palpitations, orthopnea, claudication and PND.  Gastrointestinal:  Negative for heartburn, nausea and vomiting.  Genitourinary:  Negative for hematuria.  Musculoskeletal:  Positive for myalgias.  Neurological:  Negative for dizziness and loss of consciousness.  Endo/Heme/Allergies:  Negative for polydipsia.  Psychiatric/Behavioral:  Negative for depression.     EKGs/Labs/Other Studies Reviewed:    The following studies were reviewed today: TTE 21-Feb-2020:  1. Left ventricular ejection fraction, by estimation, is 55 to 60%. The  left ventricle has normal function. The left ventricle has no regional  wall motion abnormalities. There is mild concentric left ventricular  hypertrophy. Left ventricular diastolic  function could not be evaluated.   2. Right ventricular systolic function is normal. The right ventricular  size is normal. Tricuspid regurgitation signal is inadequate for assessing  PA pressure.   3. The mitral valve is grossly normal. Mild to moderate mitral valve  regurgitation. No evidence of  mitral stenosis.   4. The aortic valve is tricuspid. There is moderate calcification of the  aortic valve. There is moderate thickening of the aortic valve. Aortic  valve regurgitation is not visualized. Mild to moderate aortic valve  sclerosis/calcification is present,  without any evidence of aortic stenosis.   5. Aortic dilatation noted. There is mild dilatation of the aortic root,  measuring 42 mm.   6. The inferior vena cava is dilated in size with >50% respiratory  variability, suggesting right atrial pressure of 8 mmHg.   CT Chest 01/22/20: FINDINGS: Cardiovascular: There are no significant vascular findings. Scattered vascular calcifications are seen within the aorta. Coronary artery calcifications are seen. The heart size is normal. There is no pericardial thickening or effusion.   Mediastinum/Nodes: There are no enlarged mediastinal, hilar or axillary lymph nodes. The thyroid gland, trachea and esophagus demonstrate no significant findings.  Lungs/Pleura: Rounded patchy airspace opacity is seen within the posterior left lung base with mild bronchial wall thickening. There is small tree-in-bud opacities also noted within the right lung base.   Upper abdomen: The visualized portion of the upper abdomen is unremarkable.   Musculoskeletal/Chest wall: There is no chest wall mass or suspicious osseous finding. No acute osseous abnormality   IMPRESSION: Patchy airspace opacity with tree-in-bud opacities at the posterior left lung base which could be due to resolving infectious etiology from August 2021 or inflammatory process.    EKG:  EKG is  ordered today.  The ekg ordered today demonstrates NSR with HR 70  Recent Labs: 05/17/2022: ALT 10; BUN 14; Creatinine 0.97; Hemoglobin 11.9; Platelet Count 228; Potassium 3.3; Sodium 139  Recent Lipid Panel    Component Value Date/Time   CHOL 137 06/09/2020 0929   TRIG 143 06/09/2020 0929   HDL 37 (L) 06/09/2020 0929    CHOLHDL 3.7 06/09/2020 0929   LDLCALC 75 06/09/2020 0929     Risk Assessment/Calculations:     CHA2DS2-VASc Score = 2  This indicates a 2.2% annual risk of stroke. The patient's score is based upon: CHF History: 0 HTN History: 0 Diabetes History: 0 Stroke History: 0 Vascular Disease History: 1 Age Score: 1 Gender Score: 0    Physical Exam:    VS:  There were no vitals taken for this visit.    Wt Readings from Last 3 Encounters:  05/17/22 272 lb 3.2 oz (123.5 kg)  05/04/22 269 lb 3.2 oz (122.1 kg)  05/03/22 267 lb (121.1 kg)     GEN:  Well nourished, well developed in no acute distress HEENT: Normal NECK: No JVD; No carotid bruits CARDIAC: RRR, no murmurs, rubs, gallops RESPIRATORY:  Clear to auscultation without rales, wheezing or rhonchi  ABDOMEN: Soft, non-tender, non-distended MUSCULOSKELETAL:  1+ pitting edema with R>L; No deformity  SKIN: Warm and dry NEUROLOGIC:  Alert and oriented x 3 PSYCHIATRIC:  Normal affect   ASSESSMENT:    No diagnosis found.  PLAN:    In order of problems listed above:  #Persistent Atrial fibrillation: CHADs-vasc 2. On anticoagulation with apixaban '5mg'$  BID. Failed DCCV on 02/23/20 (initial return to NSR but rapidly flipped back into Afib) and 04/12/20 after amiodarone load. -Continue eliquis '5mg'$  BID -Off amiodarone -Continue metop '50mg'$  XL; HR 70s -Normal BiV function and atrial size on TTE -Stopped dilt as did not want to cause bradycardia in the setting of metop and amiodarone -Continue famotidine; unable to take PPI as interacts with methotrexate; HgB stable -Follows with Afib clinic, appreciate assistance -Will refer to EP for further evaluation  #Coronary calcification: #HLD: Noted on CT chest. No exertional symptoms. LDL 127. -Continue crestor '10mg'$  daily -Repeat labs in 6 weeks  #LE edema: TTE with normal BiV function, mild to moderate MR -Continue intermittent lasix as needed   #Mild-to-Moderate MR: Noted on  TTE on 01/2020. 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  -Yearly surveillance imaging -Blood pressure control with metoprolol; well controlled today -Advised against heavy lifting    #Lichen Planus: On chronic methotrexate. Off steroids now. -Follow-up with Derm as scheduled      Medication Adjustments/Labs and Tests Ordered: Current medicines are reviewed at length with the patient today.  Concerns regarding medicines are outlined above.  No orders of the defined types were placed in this encounter.  No orders of the defined types were placed in this encounter.  There are no Patient Instructions on file for this visit.    Signed, Freada Bergeron, MD  05/23/2022 9:08 AM    [place]

## 2022-05-24 ENCOUNTER — Ambulatory Visit: Payer: Medicare HMO | Attending: Cardiology | Admitting: Cardiology

## 2022-05-24 ENCOUNTER — Ambulatory Visit (INDEPENDENT_AMBULATORY_CARE_PROVIDER_SITE_OTHER): Payer: Medicare HMO

## 2022-05-24 ENCOUNTER — Encounter: Payer: Self-pay | Admitting: Cardiology

## 2022-05-24 VITALS — BP 116/82 | HR 73 | Ht 72.0 in | Wt 272.0 lb

## 2022-05-24 DIAGNOSIS — R011 Cardiac murmur, unspecified: Secondary | ICD-10-CM | POA: Diagnosis not present

## 2022-05-24 DIAGNOSIS — I77810 Thoracic aortic ectasia: Secondary | ICD-10-CM

## 2022-05-24 DIAGNOSIS — I4819 Other persistent atrial fibrillation: Secondary | ICD-10-CM

## 2022-05-24 DIAGNOSIS — C169 Malignant neoplasm of stomach, unspecified: Secondary | ICD-10-CM

## 2022-05-24 DIAGNOSIS — I4891 Unspecified atrial fibrillation: Secondary | ICD-10-CM | POA: Diagnosis not present

## 2022-05-24 DIAGNOSIS — D6869 Other thrombophilia: Secondary | ICD-10-CM

## 2022-05-24 DIAGNOSIS — E782 Mixed hyperlipidemia: Secondary | ICD-10-CM

## 2022-05-24 MED ORDER — METOPROLOL TARTRATE 25 MG PO TABS: 25.0000 mg | ORAL_TABLET | Freq: Two times a day (BID) | ORAL | 4 refills | Status: DC | PRN

## 2022-05-24 NOTE — Telephone Encounter (Signed)
Pt saw Dr. Johney Frame in clinic today for complaints.   Please refer to OV note from today for further details.

## 2022-05-24 NOTE — Progress Notes (Signed)
Cardiology Office Note:    Date:  05/24/2022   ID:  Todd Harrison, DOB [birth year], MRN [id number]  PCP:  Lorene Dy, MD  [facility] Cardiologist:  Freada Bergeron, MD  [facility] Electrophysiologist:  Vickie Epley, MD   Referring MD: Lorene Dy, MD    History of Present Illness:    Todd Harrison is a 77 y.o. male with a hx of lichen planus on methotrexate and chronic prednisone, hypothyroidism, and newly diagnosed Afib with failed DCCV x2 (initial return to NSR on first DCCV but reverted back to Afib) who presents to clinic for follow-up.   Patient was diagnosed with left sided pneumonia and acute asthma exacerbation in 01/2020. He was treated with medrol dose pack and levaquin with modest improvement. He continued to have a productive cough and was following up with his pulmonologist when he was noted to be in Afib with RVR to 130s for which he was transferred to the ED.   During his hospitalization in September 2021, the patient was rate controlled initially with a dilt gtt later transitioned to cardizem and bystolic. He was initiated on eliquis for anticoagulation with plans to DCCV once he received 3 weeks of AC.   He returned for DCCV on 10/18 where he initially converted to NSR but rapidly returned back into Afib. At our follow-up visit on 03/11/20, he remained symptomatic in Afib. We loaded him with amiodarone and re-attempted DCCV on 12/06 but without success. He now returns to clinic for follow-up.  Was last seen by Dr. Quentin Ore on 04/2021 where he was doing well without recurrence of Afib. Amiodarone was stopped.    Today, the patient states that since his last visit, he has been diagnosed with gastric cancer. He has received 2 chemotherapy treatments with his next treatment scheduled for next week. Occasionally while at the oncology clinic, his pulse races at 135-140 bpm. However, he reports that no signs of atrial fibrillation were discovered. A few  weeks ago he had called EMS due to his racing heart rate. He states that EMS performed an EKG that showed "no evidence of Afib." Strips not available tor review. Generally he describes his rapid palpitations as unpredictable. During these episodes, he has significant SOB. These symptoms resolve once his HR become better controlled. No orthopnea, PND, significant LE edema, or chest pain.  Currently he is off Eliquis. This was stopped due to concern for bleeding in the setting of his gastric malignancy/  He denies any chest pain, lightheadedness, headaches, syncope, orthopnea, or PND.   Past Medical History:  Diagnosis Date   A-fib ([place])    Asthma    Cataract    GERD (gastroesophageal reflux disease)    Lichen planus    Methotrexate, long term, current use    no longer taking   Psoriasis    Sleep apnea    Thyroid disease    Urticaria     Past Surgical History:  Procedure Laterality Date   CARDIOVERSION N/A 02/23/2020   Procedure: CARDIOVERSION;  Surgeon: Werner Lean, MD;  Location: [facility];  Service: Cardiovascular;  Laterality: N/A;   CARDIOVERSION N/A 04/12/2020   Procedure: CARDIOVERSION;  Surgeon: Buford Dresser, MD;  Location: [place];  Service: Cardiovascular;  Laterality: N/A;   CATARACT EXTRACTION Left    2019   IR IMAGING GUIDED PORT INSERTION  05/03/2022   TONSILLECTOMY      Current Medications: Current Meds  Medication Sig   acetaminophen (TYLENOL) 500 MG tablet  Take 500 mg by mouth every 6 (six) hours as needed for moderate pain.   clobetasol ointment (TEMOVATE) 6.22 % Apply 1 Application topically 2 (two) times daily as needed (rash).   fluticasone (FLONASE) 50 MCG/ACT nasal spray Place 1 spray into both nostrils daily as needed for allergies or rhinitis.   hydroxychloroquine (PLAQUENIL) 200 MG tablet Take 200 mg by mouth 2 (two) times daily.   hydrOXYzine (ATARAX) 10 MG tablet Take 1 tablet (10 mg total) by mouth 3 (three) times daily  as needed.   ISOtretinoin (ACCUTANE) 40 MG capsule Take 40 mg by mouth 2 (two) times daily.    levothyroxine (SYNTHROID) 112 MCG tablet Take 112 mcg by mouth daily before breakfast.   lidocaine-prilocaine (EMLA) cream Apply 1 Application topically as needed (Apply 1 hour before coming to [facility] for accessing).   metoprolol succinate (TOPROL-XL) 50 MG 24 hr tablet TAKE 1 TABLET BY MOUTH EVERY DAY   metoprolol tartrate (LOPRESSOR) 25 MG tablet Take 1 tablet (25 mg total) by mouth 2 (two) times daily as needed (palpitations).   omeprazole (PRILOSEC) 40 MG capsule Take 40 mg by mouth daily.   ondansetron (ZOFRAN) 8 MG tablet Take 1 tablet (8 mg total) by mouth every 8 (eight) hours as needed for nausea or vomiting.   polyethylene glycol (MIRALAX / GLYCOLAX) 17 g packet Take 17 g by mouth daily.   potassium chloride SA (KLOR-CON M) 20 MEQ tablet Take 1 tablet (20 mEq total) by mouth daily.   predniSONE (DELTASONE) 20 MG tablet Take 40 mg daily for 3 days, then 20 mg daily for 3 days, then 10 mg daily if rash recurs   prochlorperazine (COMPAZINE) 10 MG tablet Take 1 tablet (10 mg total) by mouth every 6 (six) hours as needed for nausea or vomiting.   traMADol (ULTRAM) 50 MG tablet Take 1 tablet (50 mg total) by mouth every 6 (six) hours as needed.   triamcinolone (KENALOG) 0.1 % Apply 1 application topically daily as needed (lichen planus flare).   [DISCONTINUED] ELIQUIS 5 MG TABS tablet TAKE 1 TABLET BY MOUTH TWICE A DAY   [DISCONTINUED] rosuvastatin (CRESTOR) 10 MG tablet Take 1 tablet (10 mg total) by mouth daily. Please contact our office to schedule an overdue appointment with Dr. Quentin Ore for any future refills. [phone]. Thank you. Final attempt     Allergies:   Gold and Mixed grasses   Social History   Socioeconomic History   Marital status: Married    Spouse name: Not on file   Number of children: Not on file   Years of education: Not on file   Highest education level: Not on  file  Occupational History   Not on file  Tobacco Use   Smoking status: Former    Types: Cigarettes    Quit date: 1982    Years since quitting: 42.0   Smokeless tobacco: Never  Vaping Use   Vaping Use: Not on file  Substance and Sexual Activity   Alcohol use: Yes    Alcohol/week: 1.0 standard drink of alcohol    Types: 1 Glasses of wine per week    Comment: occasionally   Drug use: No   Sexual activity: Not on file  Other Topics Concern   Not on file  Social History Narrative   Married with 1 son and 1 daughter   Former smoker no alcohol no caffeine no tobacco or drug use   Social Determinants of Radio broadcast assistant Strain: Low  Risk  (04/17/2022)   Overall Financial Resource Strain (CARDIA)    Difficulty of Paying Living Expenses: Not hard at all  Food Insecurity: No Food Insecurity (04/17/2022)   Hunger Vital Sign    Worried About Running Out of Food in the Last Year: Never true    Ran Out of Food in the Last Year: Never true  Transportation Needs: No Transportation Needs (04/17/2022)   PRAPARE - Hydrologist (Medical): No    Lack of Transportation (Non-Medical): No  Physical Activity: Not on file  Stress: No Stress Concern Present (04/17/2022)   [place]    Feeling of Stress : Not at all  Social Connections: Not on file     Family History: The patient's family history includes Diabetes in his mother; Leukemia in his maternal great-grandmother; Stroke in his mother. There is no history of Allergic rhinitis, Asthma, Eczema, Urticaria, Colon cancer, Esophageal cancer, or Stomach cancer.  ROS:   Please see the history of present illness.    Review of Systems  Constitutional:  Negative for chills and fever.  HENT:  Negative for congestion.   Eyes:  Negative for blurred vision.  Respiratory:  Positive for shortness of breath. Negative for cough and sputum  production.   Cardiovascular:  Positive for palpitations. Negative for chest pain, orthopnea, claudication, leg swelling and PND.  Gastrointestinal:  Negative for heartburn, nausea and vomiting.  Genitourinary:  Negative for hematuria.  Musculoskeletal:  Positive for myalgias.  Neurological:  Negative for dizziness and loss of consciousness.  Endo/Heme/Allergies:  Negative for polydipsia.  Psychiatric/Behavioral:  Negative for depression.     EKGs/Labs/Other Studies Reviewed:    The following studies were reviewed today:  CT Chest  08/11/2020: IMPRESSION: 1. Peripheral basilar predominant pattern of subpleural reticulation and ground-glass may be due to nonspecific interstitial pneumonitis or usual interstitial pneumonitis. Findings are categorized as probable UIP per consensus guidelines: Diagnosis of Idiopathic Pulmonary Fibrosis: An Official ATS/ERS/JRS/ALAT Clinical Practice Guideline. [place], Iss 5, [phone], Jan 06 2017. 2. Air trapping is indicative of small airways disease. 3. Subtle hypodense lesion in the liver dome, possibly new from prior exams. Further evaluation could be performed with MR abdomen without and with contrast, as clinically indicated. 4. Ascending aortic aneurysm, stable (4.0 cm). Recommend annual imaging followup by CTA or MRA. This recommendation follows 2010 ACCF/AHA/AATS/ACR/ASA/SCA/SCAI/SIR/STS/SVM Guidelines for the Diagnosis and Management of Patients with Thoracic Aortic Disease. Circulation. 2010; 121: M841-L244. Aortic aneurysm NOS (ICD10-I71.9). 5. Aortic atherosclerosis (ICD10-I70.0). Coronary artery calcification. 6.  Emphysema (ICD10-J43.9).  TTE 01/23/20:  1. Left ventricular ejection fraction, by estimation, is 55 to 60%. The  left ventricle has normal function. The left ventricle has no regional  wall motion abnormalities. There is mild concentric left ventricular  hypertrophy. Left ventricular diastolic   function could not be evaluated.   2. Right ventricular systolic function is normal. The right ventricular  size is normal. Tricuspid regurgitation signal is inadequate for assessing  PA pressure.   3. The mitral valve is grossly normal. Mild to moderate mitral valve  regurgitation. No evidence of mitral stenosis.   4. The aortic valve is tricuspid. There is moderate calcification of the  aortic valve. There is moderate thickening of the aortic valve. Aortic  valve regurgitation is not visualized. Mild to moderate aortic valve  sclerosis/calcification is present,  without any evidence of aortic stenosis.   5. Aortic dilatation  noted. There is mild dilatation of the aortic root,  measuring 42 mm.   6. The inferior vena cava is dilated in size with >50% respiratory  variability, suggesting right atrial pressure of 8 mmHg.   CT Chest 01/22/20: FINDINGS: Cardiovascular: There are no significant vascular findings. Scattered vascular calcifications are seen within the aorta. Coronary artery calcifications are seen. The heart size is normal. There is no pericardial thickening or effusion.   Mediastinum/Nodes: There are no enlarged mediastinal, hilar or axillary lymph nodes. The thyroid gland, trachea and esophagus demonstrate no significant findings.   Lungs/Pleura: Rounded patchy airspace opacity is seen within the posterior left lung base with mild bronchial wall thickening. There is small tree-in-bud opacities also noted within the right lung base.   Upper abdomen: The visualized portion of the upper abdomen is unremarkable.   Musculoskeletal/Chest wall: There is no chest wall mass or suspicious osseous finding. No acute osseous abnormality   IMPRESSION: Patchy airspace opacity with tree-in-bud opacities at the posterior left lung base which could be due to resolving infectious etiology from August 2021 or inflammatory process.    EKG:  EKG is personally  reviewed. 05/24/2022:  Sinus rhythm. PVC. LVH. Rate 76 bpm. 04/28/2020:  NSR with HR 70  Recent Labs: 05/17/2022: ALT 10; BUN 14; Creatinine 0.97; Hemoglobin 11.9; Platelet Count 228; Potassium 3.3; Sodium 139   Recent Lipid Panel    Component Value Date/Time   CHOL 137 06/09/2020 0929   TRIG 143 06/09/2020 0929   HDL 37 (L) 06/09/2020 0929   CHOLHDL 3.7 06/09/2020 0929   LDLCALC 75 06/09/2020 0929     Risk Assessment/Calculations:     CHA2DS2-VASc Score = 2  This indicates a 2.2% annual risk of stroke. The patient's score is based upon: CHF History: 0 HTN History: 0 Diabetes History: 0 Stroke History: 0 Vascular Disease History: 1 Age Score: 1 Gender Score: 0    Physical Exam:    VS:  BP 116/82   Pulse 73   Ht 6' (1.829 m)   Wt 272 lb (123.4 kg)   SpO2 94%   BMI 36.89 kg/m     Wt Readings from Last 3 Encounters:  05/24/22 272 lb (123.4 kg)  05/17/22 272 lb 3.2 oz (123.5 kg)  05/04/22 269 lb 3.2 oz (122.1 kg)     GEN:  Well nourished, well developed in no acute distress HEENT: Normal NECK: No JVD; No carotid bruits CARDIAC: RRR, 2/6 harsh systolic murmur, No rubs, no gallops RESPIRATORY:  Clear to auscultation without rales, wheezing or rhonchi  ABDOMEN: Soft, non-tender, non-distended MUSCULOSKELETAL:  Trace -1+ LE edema (chronic), warm SKIN: Warm and dry NEUROLOGIC:  Alert and oriented x 3 PSYCHIATRIC:  Normal affect   ASSESSMENT:    1. Atrial fibrillation with RVR (HCC)   2. Persistent atrial fibrillation ([place])   3. Murmur   4. Aortic root dilation (HCC)   5. Malignant neoplasm of stomach, unspecified location ([place])   6. Mixed hyperlipidemia   7. Secondary hypercoagulable state ([place])     PLAN:    In order of problems listed above:  #Persistent Atrial fibrillation: CHADs-vasc 2.Failed DCCV on 02/23/20 (initial return to NSR but rapidly flipped back into Afib) and 04/12/20 but converted after amiodarone load. His amiodarone has since been  stopped as he remained in rhythm and did not want the side-effects of the medication. He is having episodes of palpitations which are suspicious for Afib but he states he was told it was not atrial  fibrillation. ECGs not available to review. Of note, his apixaban was also held due to concern for bleeding from his gastric cancer. Will touch base with Oncology and Dr. Quentin Ore to come up with a plan going forward. ? Candidate for watchman vs ablation. Will continue metop for now given he is off AC with prn doses as needed for palpitations. Will also check a cardiac monitor to assess further. -Check 2 week zio -Apixaban held by Onc due to concern for bleeding -Continue metop '50mg'$  XL daily -Start metop '25mg'$  BID prn for palpitations -Off amiodarone; will not restart now given he is off [facility] -Will touch base with Dr. Quentin Ore and Dr. Learta Codding regarding plan; ? Candidate for watchman -Normal BiV function and atrial size on TTE  #Gastric Cancer: Completed cycles of FOLFOX. Biopsy with metastatic poorly differentiated adenocarcinoma. EGD with large infiltrative mass with oozing bleeding and stigmata of recent bleeding in the gastric fundus, on the anterior wall of the gastric body and on the greater curvature of the gastric body; deformity in the gastric antrum. Follows with Dr. Benay Spice. -Follow-up with Dr. Benay Spice as scheduled  #Coronary calcification: #HLD: Noted on CT chest. No exertional symptoms.  -Continue crestor '10mg'$  daily -LDL 73 in 10/2021 -Repeat at next visit for monitoring  #LE edema: Chronic and stable. TTE with normal BiV function, mild to moderate MR -Continue intermittent lasix as needed   #Mild-to-Moderate MR: Noted on TTE on 01/2020. -Repeat TTE for monitoring   #Aortic root aneurysm (4.36m): Noted on TTE on 01/2020.  -Repeat TTE as above   #Lichen Planus: On chronic methotrexate. Off steroids now. -Follow-up with Derm as scheduled   Follow-up:  3 months.  Medication  Adjustments/Labs and Tests Ordered: Current medicines are reviewed at length with the patient today.  Concerns regarding medicines are outlined above.   Orders Placed This Encounter  Procedures   LONG TERM MONITOR (3-14 DAYS)   EKG 12-Lead   ECHOCARDIOGRAM COMPLETE   Meds ordered this encounter  Medications   metoprolol tartrate (LOPRESSOR) 25 MG tablet    Sig: Take 1 tablet (25 mg total) by mouth 2 (two) times daily as needed (palpitations).    Dispense:  30 tablet    Refill:  4   Patient Instructions  Medication Instructions:   START TAKING METOPROLOL TARTRATE 25 MG BY MOUTH TWICE DAILY AS NEEDED FOR PALPITATIONS  *If you need a refill on your cardiac medications before your next appointment, please call your pharmacy*    Testing/Procedures:  Your physician has requested that you have an echocardiogram. Echocardiography is a painless test that uses sound waves to create images of your heart. It provides your doctor with information about the size and shape of your heart and how well your heart's chambers and valves are working. This procedure takes approximately one hour. There are no restrictions for this procedure. Please do NOT wear cologne, perfume, aftershave, or lotions (deodorant is allowed). Please arrive 15 minutes prior to your appointment time.   ZIO XT- Long Term Monitor Instructions  Your physician has requested you wear a ZIO patch monitor for 14 days.  PLACED ON THE PATIENT TODAY IN CLINIC  This is a single patch monitor. [employer] supplies one patch monitor per enrollment. Additional stickers are not available. Please do not apply patch if you will be having a Nuclear Stress Test,  Echocardiogram, Cardiac CT, MRI, or Chest Xray during the period you would be wearing the  monitor. The patch cannot be worn during these tests. You  cannot remove and re-apply the  ZIO XT patch monitor.  Your ZIO patch monitor will be mailed 3 day USPS to your address on file. It  may take 3-5 days  to receive your monitor after you have been enrolled.  Once you have received your monitor, please review the enclosed instructions. Your monitor  has already been registered assigning a specific monitor serial # to you.  Billing and Patient Assistance Program Information  We have supplied [employer] with any of your insurance information on file for billing purposes. [employer] offers a sliding scale Patient Assistance Program for patients that do not have  insurance, or whose insurance does not completely cover the cost of the ZIO monitor.  You must apply for the Patient Assistance Program to qualify for this discounted rate.  To apply, please call [employer] at [phone], select option 4, select option 2, ask to apply for  Patient Assistance Program. Theodore Demark will ask your household income, and how many people  are in your household. They will quote your out-of-pocket cost based on that information.  [employer] will also be able to set up a 65-month interest-free payment plan if needed.  Applying the monitor   Shave hair from upper left chest.  Hold abrader disc by orange tab. Rub abrader in 40 strokes over the upper left chest as  indicated in your monitor instructions.  Clean area with 4 enclosed alcohol pads. Let dry.  Apply patch as indicated in monitor instructions. Patch will be placed under collarbone on left  side of chest with arrow pointing upward.  Rub patch adhesive wings for 2 minutes. Remove white label marked "1". Remove the white  label marked "2". Rub patch adhesive wings for 2 additional minutes.  While looking in a mirror, press and release button in center of patch. A small green light will  flash 3-4 times. This will be your only indicator that the monitor has been turned on.  Do not shower for the first 24 hours. You may shower after the first 24 hours.  Press the button if you feel a symptom. You will hear a small click. Record Date, Time and  Symptom  in the Patient Logbook.  When you are ready to remove the patch, follow instructions on the last 2 pages of Patient  Logbook. Stick patch monitor onto the last page of Patient Logbook.  Place Patient Logbook in the blue and white box. Use locking tab on box and tape box closed  securely. The blue and white box has prepaid postage on it. Please place it in the mailbox as  soon as possible. Your physician should have your test results approximately 7 days after the  monitor has been [facility]  Call [employer] [phone] you have questions regarding  your ZIO XT patch monitor. Call them immediately if you see an orange light blinking on your  monitor.  If your monitor falls off in less than 4 days, contact our Monitor department at [phone]  If your monitor becomes loose or falls off after 4 days call [employer] at [phone]  suggestions on securing your monitor   Follow-Up:  3 MONTHS WITH AN EXTENDER IN THE OFFICE--PLEASE SCHEDULE WITH AN EXTENDER PER DR. PJohney Frame   I,Mathew Stumpf,acting as a scribe for HFreada Bergeron MD.,have documented all relevant documentation on the behalf of HFreada Bergeron MD,as directed by  HFreada Bergeron MD while in the presence of HFreada Bergeron  MD.  Tanja Port, MD, have reviewed all documentation for this visit. The documentation on 05/24/22 for the exam, diagnosis, procedures, and orders are all accurate and complete.    Signed, Freada Bergeron, MD  05/24/2022 5:20 PM    [place]

## 2022-05-24 NOTE — Patient Instructions (Signed)
Medication Instructions:   START TAKING METOPROLOL TARTRATE 25 MG BY MOUTH TWICE DAILY AS NEEDED FOR PALPITATIONS  *If you need a refill on your cardiac medications before your next appointment, please call your pharmacy*    Testing/Procedures:  Your physician has requested that you have an echocardiogram. Echocardiography is a painless test that uses sound waves to create images of your heart. It provides your doctor with information about the size and shape of your heart and how well your heart's chambers and valves are working. This procedure takes approximately one hour. There are no restrictions for this procedure. Please do NOT wear cologne, perfume, aftershave, or lotions (deodorant is allowed). Please arrive 15 minutes prior to your appointment time.   ZIO XT- Long Term Monitor Instructions  Your physician has requested you wear a ZIO patch monitor for 14 days.  PLACED ON THE PATIENT TODAY IN CLINIC  This is a single patch monitor. [employer] supplies one patch monitor per enrollment. Additional stickers are not available. Please do not apply patch if you will be having a Nuclear Stress Test,  Echocardiogram, Cardiac CT, MRI, or Chest Xray during the period you would be wearing the  monitor. The patch cannot be worn during these tests. You cannot remove and re-apply the  ZIO XT patch monitor.  Your ZIO patch monitor will be mailed 3 day USPS to your address on file. It may take 3-5 days  to receive your monitor after you have been enrolled.  Once you have received your monitor, please review the enclosed instructions. Your monitor  has already been registered assigning a specific monitor serial # to you.  Billing and Patient Assistance Program Information  We have supplied [employer] with any of your insurance information on file for billing purposes. [employer] offers a sliding scale Patient Assistance Program for patients that do not have  insurance, or whose insurance does not  completely cover the cost of the ZIO monitor.  You must apply for the Patient Assistance Program to qualify for this discounted rate.  To apply, please call [employer] at [phone], select option 4, select option 2, ask to apply for  Patient Assistance Program. Theodore Demark will ask your household income, and how many people  are in your household. They will quote your out-of-pocket cost based on that information.  [employer] will also be able to set up a 91-month interest-free payment plan if needed.  Applying the monitor   Shave hair from upper left chest.  Hold abrader disc by orange tab. Rub abrader in 40 strokes over the upper left chest as  indicated in your monitor instructions.  Clean area with 4 enclosed alcohol pads. Let dry.  Apply patch as indicated in monitor instructions. Patch will be placed under collarbone on left  side of chest with arrow pointing upward.  Rub patch adhesive wings for 2 minutes. Remove white label marked "1". Remove the white  label marked "2". Rub patch adhesive wings for 2 additional minutes.  While looking in a mirror, press and release button in center of patch. A small green light will  flash 3-4 times. This will be your only indicator that the monitor has been turned on.  Do not shower for the first 24 hours. You may shower after the first 24 hours.  Press the button if you feel a symptom. You will hear a small click. Record Date, Time and  Symptom in the Patient Logbook.  When you are ready to remove the patch, follow instructions on  the last 2 pages of Patient  Logbook. Stick patch monitor onto the last page of Patient Logbook.  Place Patient Logbook in the blue and white box. Use locking tab on box and tape box closed  securely. The blue and white box has prepaid postage on it. Please place it in the mailbox as  soon as possible. Your physician should have your test results approximately 7 days after the  monitor has been mailed back to [facility].  Call  [facility] at [phone] if you have questions regarding  your ZIO XT patch monitor. Call them immediately if you see an orange light blinking on your  monitor.  If your monitor falls off in less than 4 days, contact our Monitor department at [phone].  If your monitor becomes loose or falls off after 4 days call [employer] at [phone] for  suggestions on securing your monitor   Follow-Up:  3 MONTHS WITH AN EXTENDER IN THE OFFICE--PLEASE SCHEDULE WITH AN EXTENDER PER DR. Johney Frame

## 2022-05-24 NOTE — Progress Notes (Unsigned)
ZIO XT Serial # [id number] from office inventory applied in office.

## 2022-05-26 ENCOUNTER — Other Ambulatory Visit: Payer: Self-pay

## 2022-05-28 ENCOUNTER — Other Ambulatory Visit: Payer: Self-pay | Admitting: Oncology

## 2022-05-28 DIAGNOSIS — C168 Malignant neoplasm of overlapping sites of stomach: Secondary | ICD-10-CM

## 2022-05-31 ENCOUNTER — Inpatient Hospital Stay: Payer: Medicare HMO

## 2022-05-31 ENCOUNTER — Inpatient Hospital Stay: Payer: Medicare HMO | Admitting: Nutrition

## 2022-05-31 ENCOUNTER — Encounter: Payer: Self-pay | Admitting: *Deleted

## 2022-05-31 ENCOUNTER — Inpatient Hospital Stay: Payer: Medicare HMO | Admitting: Oncology

## 2022-05-31 VITALS — BP 114/75 | HR 86 | Temp 98.2°F | Resp 18 | Ht 72.0 in | Wt 264.0 lb

## 2022-05-31 VITALS — BP 105/69 | HR 77

## 2022-05-31 DIAGNOSIS — Z5111 Encounter for antineoplastic chemotherapy: Secondary | ICD-10-CM | POA: Diagnosis not present

## 2022-05-31 DIAGNOSIS — C168 Malignant neoplasm of overlapping sites of stomach: Secondary | ICD-10-CM | POA: Diagnosis not present

## 2022-05-31 LAB — CMP (CANCER CENTER ONLY)
ALT: 11 U/L (ref 0–44)
AST: 13 U/L — ABNORMAL LOW (ref 15–41)
Albumin: 3.5 g/dL (ref 3.5–5.0)
Alkaline Phosphatase: 73 U/L (ref 38–126)
Anion gap: 9 (ref 5–15)
BUN: 20 mg/dL (ref 8–23)
CO2: 27 mmol/L (ref 22–32)
Calcium: 9.1 mg/dL (ref 8.9–10.3)
Chloride: 104 mmol/L (ref 98–111)
Creatinine: 1.04 mg/dL (ref 0.61–1.24)
GFR, Estimated: >60 mL/min (ref >60)
Glucose, Bld: 64 mg/dL — ABNORMAL LOW (ref 70–99)
Potassium: 3.5 mmol/L (ref 3.5–5.1)
Sodium: 140 mmol/L (ref 135–145)
Total Bilirubin: 0.5 mg/dL (ref 0.3–1.2)
Total Protein: 6.3 g/dL — ABNORMAL LOW (ref 6.5–8.1)

## 2022-05-31 LAB — CBC WITH DIFFERENTIAL (CANCER CENTER ONLY)
Abs Immature Granulocytes: 0.06 10*3/uL (ref 0.00–0.07)
Basophils Absolute: 0.1 10*3/uL (ref 0.0–0.1)
Basophils Relative: 1 %
Eosinophils Absolute: 0 10*3/uL (ref 0.0–0.5)
Eosinophils Relative: 0 %
HCT: 37.7 % — ABNORMAL LOW (ref 39.0–52.0)
Hemoglobin: 12.2 g/dL — ABNORMAL LOW (ref 13.0–17.0)
Immature Granulocytes: 1 %
Lymphocytes Relative: 24 %
Lymphs Abs: 2.8 10*3/uL (ref 0.7–4.0)
MCH: 27.7 pg (ref 26.0–34.0)
MCHC: 32.4 g/dL (ref 30.0–36.0)
MCV: 85.5 fL (ref 80.0–100.0)
Monocytes Absolute: 1.2 10*3/uL — ABNORMAL HIGH (ref 0.1–1.0)
Monocytes Relative: 11 %
Neutro Abs: 7.3 10*3/uL (ref 1.7–7.7)
Neutrophils Relative %: 63 %
Platelet Count: 178 10*3/uL (ref 150–400)
RBC: 4.41 MIL/uL (ref 4.22–5.81)
RDW: 16.1 % — ABNORMAL HIGH (ref 11.5–15.5)
WBC Count: 11.4 10*3/uL — ABNORMAL HIGH (ref 4.0–10.5)
nRBC: 0 % (ref 0.0–0.2)

## 2022-05-31 MED ORDER — FLUOROURACIL CHEMO INJECTION 2.5 GM/50ML
400.0000 mg/m2 | Freq: Once | INTRAVENOUS | Status: AC
  Administered 2022-05-31: 1000 mg via INTRAVENOUS
  Filled 2022-05-31: qty 20

## 2022-05-31 MED ORDER — SODIUM CHLORIDE 0.9 % IV SOLN
2025.0000 mg/m2 | INTRAVENOUS | Status: DC
  Administered 2022-05-31: 5000 mg via INTRAVENOUS
  Filled 2022-05-31: qty 100

## 2022-05-31 MED ORDER — LEUCOVORIN CALCIUM INJECTION 350 MG
400.0000 mg/m2 | Freq: Once | INTRAVENOUS | Status: AC
  Administered 2022-05-31: 992 mg via INTRAVENOUS
  Filled 2022-05-31: qty 49.6

## 2022-05-31 MED ORDER — DEXTROSE 5 % IV SOLN: Freq: Once | INTRAVENOUS | Status: AC

## 2022-05-31 MED ORDER — OXALIPLATIN CHEMO INJECTION 100 MG/20ML
65.0000 mg/m2 | Freq: Once | INTRAVENOUS | Status: AC
  Administered 2022-05-31: 160 mg via INTRAVENOUS
  Filled 2022-05-31: qty 32

## 2022-05-31 MED ORDER — SODIUM CHLORIDE 0.9 % IV SOLN
10.0000 mg | Freq: Once | INTRAVENOUS | Status: AC
  Administered 2022-05-31: 10 mg via INTRAVENOUS
  Filled 2022-05-31: qty 10

## 2022-05-31 MED ORDER — PALONOSETRON HCL INJECTION 0.25 MG/5ML
0.2500 mg | Freq: Once | INTRAVENOUS | Status: AC
  Administered 2022-05-31: 0.25 mg via INTRAVENOUS
  Filled 2022-05-31: qty 5

## 2022-05-31 NOTE — Progress Notes (Signed)
[facility] OFFICE PROGRESS NOTE   Diagnosis: Gastric cancer  INTERVAL HISTORY:   Todd Harrison completed another cycle of FOLFOX on 05/17/2022.  No nausea/vomiting, mouth sores, or diarrhea.  Todd Harrison reports erythema of the tongue improved after taking a B supplement.  Todd Harrison has persistent cold sensitivity.  No numbness.  Todd Harrison has cold sensitivity with drinking liquids and sensation at the hands.  Abdomen feels less bloated. Todd Harrison saw cardiology for evaluation of atrial fibrillation.  Todd Harrison is maintained on metoprolol. Objective:  Vital signs in last 24 hours:  Blood pressure 114/75, pulse 86, temperature 98.2 F (36.8 C), temperature source Oral, resp. rate 18, height 6' (1.829 m), weight 264 lb (119.7 kg), SpO2 99 %.    HEENT: No thrush or ulcers Resp: Lungs clear bilaterally Cardio: Regular rate and rhythm GI: No hepatosplenomegaly, mildly distended Vascular: No leg edema, the right lower leg is slightly larger than the left side Neuro: Very mild to mild loss of vibratory sense at the fingertips bilaterally Skin: Palms without erythema  Portacath/PICC-without erythema  Lab Results:  Lab Results  Component Value Date   WBC 11.4 (H) 05/31/2022   HGB 12.2 (L) 05/31/2022   HCT 37.7 (L) 05/31/2022   MCV 85.5 05/31/2022   PLT 178 05/31/2022   NEUTROABS 7.3 05/31/2022    CMP  Lab Results  Component Value Date   NA 140 05/31/2022   K 3.5 05/31/2022   CL 104 05/31/2022   CO2 27 05/31/2022   GLUCOSE 64 (L) 05/31/2022   BUN 20 05/31/2022   CREATININE 1.04 05/31/2022   CALCIUM 9.1 05/31/2022   PROT 6.3 (L) 05/31/2022   ALBUMIN 3.5 05/31/2022   AST 13 (L) 05/31/2022   ALT 11 05/31/2022   ALKPHOS 73 05/31/2022   BILITOT 0.5 05/31/2022   GFRNONAA >60 05/31/2022   GFRAA 74 05/20/2020    Lab Results  Component Value Date   CEA1 <0.6 04/13/2022    Lab Results  Component Value Date   INR 1.1 04/24/2022   LABPROT 14.5 04/24/2022    Imaging:  No results  found.  Medications: I have reviewed the patient's current medications.   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  Internal hemorrhoids.  No specimens collected. Upper endoscopy 04/25/2022-large infiltrative mass with oozing bleeding and stigmata of recent bleeding in the gastric fundus, on the anterior wall of the gastric body and on the greater curvature of the gastric body; deformity in the gastric antrum.  Extrinsic deformity in the entire duodenum.  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 Her2 by IHC negative, 1+; MMR IHC normal; PD-L1 CPS 1%, positive.  Cycle 1 FOLFOX 05/04/2022 Cycle 2 FOLFOX 05/17/2022 Cycle 3 FOLFOX 05/31/2022-oxaliplatin dose reduced secondary to prolonged cold sensitivity Truncal rash-status post recent punch biopsy with pathology pending; biopsy reported negative per family 54/27/0623 Lichen planus of the feet Hypothyroidism Atrial fibrillation    Disposition: Todd Harrison has metastatic gastric cancer.  Todd Harrison is completed 2 cycles of FOLFOX.  Todd Harrison has tolerated the FOLFOX well other than cold sensitivity.  Todd Harrison has persistent cold sensitivity.  Oxaliplatin will be dose reduced with the chemotherapy today.  Todd Harrison will complete cycle 3  FOLFOX today.  Todd Harrison will return for an office visit and chemotherapy in 2 weeks.  Todd Harrison will be referred for a restaging CT after cycle 5.  Betsy Coder, MD  05/31/2022  9:10 AM

## 2022-05-31 NOTE — Patient Instructions (Addendum)
[place]  The chemotherapy medication bag should finish at 46 hours, 96 hours, or 7 days. For example, if your pump is scheduled for 46 hours and it was put on at 4:00 p.m., it should finish at 2:00 p.m. the day it is scheduled to come off regardless of your appointment time.     Estimated time to finish at 11:30 Friday June 02, 2022.   If the display on your pump reads "Low Volume" and it is beeping, take the batteries out of the pump and come to the cancer center for it to be taken off.   If the pump alarms go off prior to the pump reading "Low Volume" then call [phone] and someone can assist you.  If the plunger comes out and the chemotherapy medication is leaking out, please use your home chemo spill kit to clean up the spill. Do NOT use paper towels or other household products.  If you have problems or questions regarding your pump, please call either [phone] (24 hours a day) or the cancer center Monday-Friday 8:00 a.m.- 4:30 p.m. at the clinic number and we will assist you. If you are unable to get assistance, then go to the nearest Emergency Department and ask the staff to contact the IV team for assistance.   Discharge Instructions: Thank you for choosing [facility] to provide your oncology and hematology care.   If you have a lab appointment with the [facility], please go directly to the [facility] and check in at the registration area.   Wear comfortable clothing and clothing appropriate for easy access to any Portacath or PICC line.   We strive to give you quality time with your provider. You may need to reschedule your appointment if you arrive late (15 or more minutes).  Arriving late affects you and other patients whose appointments are after yours.  Also, if you miss three or more appointments without notifying the office, you may be dismissed from the clinic at the provider's discretion.      For  prescription refill requests, have your pharmacy contact our office and allow 72 hours for refills to be completed.    Today you received the following chemotherapy and/or immunotherapy agents Oxaliplatin, Leucovorin, Flurouracil.      To help prevent nausea and vomiting after your treatment, we encourage you to take your nausea medication as directed.  BELOW ARE SYMPTOMS THAT SHOULD BE REPORTED IMMEDIATELY: *FEVER GREATER THAN 100.4 F (38 C) OR HIGHER *CHILLS OR SWEATING *NAUSEA AND VOMITING THAT IS NOT CONTROLLED WITH YOUR NAUSEA MEDICATION *UNUSUAL SHORTNESS OF BREATH *UNUSUAL BRUISING OR BLEEDING *URINARY PROBLEMS (pain or burning when urinating, or frequent urination) *BOWEL PROBLEMS (unusual diarrhea, constipation, pain near the anus) TENDERNESS IN MOUTH AND THROAT WITH OR WITHOUT PRESENCE OF ULCERS (sore throat, sores in mouth, or a toothache) UNUSUAL RASH, SWELLING OR PAIN  UNUSUAL VAGINAL DISCHARGE OR ITCHING   Items with * indicate a potential emergency and should be followed up as soon as possible or go to the Emergency Department if any problems should occur.  Please show the CHEMOTHERAPY ALERT CARD or IMMUNOTHERAPY ALERT CARD at check-in to the Emergency Department and triage nurse.  Should you have questions after your visit or need to cancel or reschedule your appointment, please contact [facility]  Dept: [phone]  and follow the prompts.  Office hours are 8:00 a.m. to 4:30 p.m. Monday - Friday. Please note  that voicemails left after 4:00 p.m. may not be returned until the following business day.  We are closed weekends and major holidays. You have access to a nurse at all times for urgent questions. Please call the main number to the clinic Dept: [phone] and follow the prompts.   For any non-urgent questions, you may also contact your provider using MyChart. We now offer e-Visits for anyone 71 and older to request care online for  non-urgent symptoms. For details visit [email or web address].   Also download the MyChart app! Go to the app store, search "MyChart", open the app, select [facility], and log in with your MyChart username and password.  Oxaliplatin Injection What is this medication? OXALIPLATIN (ox AL i PLA tin) treats some types of cancer. It works by slowing down the growth of cancer cells. This medicine may be used for other purposes; ask your health care provider or pharmacist if you have questions. COMMON BRAND NAME(S): Eloxatin What should I tell my care team before I take this medication? They need to know if you have any of these conditions: Heart disease History of irregular heartbeat or rhythm Liver disease Low blood cell levels (white cells, red cells, and platelets) Lung or breathing disease, such as asthma Take medications that treat or prevent blood clots Tingling of the fingers, toes, or other nerve disorder An unusual or allergic reaction to oxaliplatin, other medications, foods, dyes, or preservatives If you or your partner are pregnant or trying to get pregnant Breast-feeding How should I use this medication? This medication is injected into a vein. It is given by your care team in a hospital or clinic setting. Talk to your care team about the use of this medication in children. Special care may be needed. Overdosage: If you think you have taken too much of this medicine contact a poison control center or emergency room at once. NOTE: This medicine is only for you. Do not share this medicine with others. What if I miss a dose? Keep appointments for follow-up doses. It is important not to miss a dose. Call your care team if you are unable to keep an appointment. What may interact with this medication? Do not take this medication with any of the following: Cisapride Dronedarone Pimozide Thioridazine This medication may also interact with the following: Aspirin and aspirin-like  medications Certain medications that treat or prevent blood clots, such as warfarin, apixaban, dabigatran, and rivaroxaban Cisplatin Cyclosporine Diuretics Medications for infection, such as acyclovir, adefovir, amphotericin B, bacitracin, cidofovir, foscarnet, ganciclovir, gentamicin, pentamidine, vancomycin NSAIDs, medications for pain and inflammation, such as ibuprofen or naproxen Other medications that cause heart rhythm changes Pamidronate Zoledronic acid This list may not describe all possible interactions. Give your health care provider a list of all the medicines, herbs, non-prescription drugs, or dietary supplements you use. Also tell them if you smoke, drink alcohol, or use illegal drugs. Some items may interact with your medicine. What should I watch for while using this medication? Your condition will be monitored carefully while you are receiving this medication. You may need blood work while taking this medication. This medication may make you feel generally unwell. This is not uncommon as chemotherapy can affect healthy cells as well as cancer cells. Report any side effects. Continue your course of treatment even though you feel ill unless your care team tells you to stop. This medication may increase your risk of getting an infection. Call your care team for advice if you get a fever, chills, sore  throat, or other symptoms of a cold or flu. Do not treat yourself. Try to avoid being around people who are sick. Avoid taking medications that contain aspirin, acetaminophen, ibuprofen, naproxen, or ketoprofen unless instructed by your care team. These medications may hide a fever. Be careful brushing or flossing your teeth or using a toothpick because you may get an infection or bleed more easily. If you have any dental work done, tell your dentist you are receiving this medication. This medication can make you more sensitive to cold. Do not drink cold drinks or use ice. Cover exposed  skin before coming in contact with cold temperatures or cold objects. When out in cold weather wear warm clothing and cover your mouth and nose to warm the air that goes into your lungs. Tell your care team if you get sensitive to the cold. Talk to your care team if you or your partner are pregnant or think either of you might be pregnant. This medication can cause serious birth defects if taken during pregnancy and for 9 months after the last dose. A negative pregnancy test is required before starting this medication. A reliable form of contraception is recommended while taking this medication and for 9 months after the last dose. Talk to your care team about effective forms of contraception. Do not father a child while taking this medication and for 6 months after the last dose. Use a condom while having sex during this time period. Do not breastfeed while taking this medication and for 3 months after the last dose. This medication may cause infertility. Talk to your care team if you are concerned about your fertility. What side effects may I notice from receiving this medication? Side effects that you should report to your care team as soon as possible: Allergic reactions--skin rash, itching, hives, swelling of the face, lips, tongue, or throat Bleeding--bloody or black, tar-like stools, vomiting blood or brown material that looks like coffee grounds, red or dark brown urine, small red or purple spots on skin, unusual bruising or bleeding Dry cough, shortness of breath or trouble breathing Heart rhythm changes--fast or irregular heartbeat, dizziness, feeling faint or lightheaded, chest pain, trouble breathing Infection--fever, chills, cough, sore throat, wounds that don't heal, pain or trouble when passing urine, general feeling of discomfort or being unwell Liver injury--right upper belly pain, loss of appetite, nausea, light-colored stool, dark yellow or brown urine, yellowing skin or eyes, unusual  weakness or fatigue Low red blood cell level--unusual weakness or fatigue, dizziness, headache, trouble breathing Muscle injury--unusual weakness or fatigue, muscle pain, dark yellow or brown urine, decrease in amount of urine Pain, tingling, or numbness in the hands or feet Sudden and severe headache, confusion, change in vision, seizures, which may be signs of posterior reversible encephalopathy syndrome (PRES) Unusual bruising or bleeding Side effects that usually do not require medical attention (report to your care team if they continue or are bothersome): Diarrhea Nausea Pain, redness, or swelling with sores inside the mouth or throat Unusual weakness or fatigue Vomiting This list may not describe all possible side effects. Call your doctor for medical advice about side effects. You may report side effects to FDA at 1-800-FDA-1088. Where should I keep my medication? This medication is given in a hospital or clinic. It will not be stored at home. NOTE: This sheet is a summary. It may not cover all possible information. If you have questions about this medicine, talk to your doctor, pharmacist, or health care provider.  2023  Elsevier/Gold Standard (2007-06-15 00:00:00)  Leucovorin Injection What is this medication? LEUCOVORIN (loo koe VOR in) prevents side effects from certain medications, such as methotrexate. It works by increasing folate levels. This helps protect healthy cells in your body. It may also be used to treat anemia caused by low levels of folate. It can also be used with fluorouracil, a type of chemotherapy, to treat colorectal cancer. It works by increasing the effects of fluorouracil in the body. This medicine may be used for other purposes; ask your health care provider or pharmacist if you have questions. What should I tell my care team before I take this medication? They need to know if you have any of these conditions: Anemia from low levels of vitamin B12 in the  blood An unusual or allergic reaction to leucovorin, folic acid, other medications, foods, dyes, or preservatives Pregnant or trying to get pregnant Breastfeeding How should I use this medication? This medication is injected into a vein or a muscle. It is given by your care team in a hospital or clinic setting. Talk to your care team about the use of this medication in children. Special care may be needed. Overdosage: If you think you have taken too much of this medicine contact a poison control center or emergency room at once. NOTE: This medicine is only for you. Do not share this medicine with others. What if I miss a dose? Keep appointments for follow-up doses. It is important not to miss your dose. Call your care team if you are unable to keep an appointment. What may interact with this medication? Capecitabine Fluorouracil Phenobarbital Phenytoin Primidone Trimethoprim;sulfamethoxazole This list may not describe all possible interactions. Give your health care provider a list of all the medicines, herbs, non-prescription drugs, or dietary supplements you use. Also tell them if you smoke, drink alcohol, or use illegal drugs. Some items may interact with your medicine. What should I watch for while using this medication? Your condition will be monitored carefully while you are receiving this medication. This medication may increase the side effects of 5-fluorouracil. Tell your care team if you have diarrhea or mouth sores that do not get better or that get worse. What side effects may I notice from receiving this medication? Side effects that you should report to your care team as soon as possible: Allergic reactions--skin rash, itching, hives, swelling of the face, lips, tongue, or throat This list may not describe all possible side effects. Call your doctor for medical advice about side effects. You may report side effects to FDA at 1-800-FDA-1088. Where should I keep my  medication? This medication is given in a hospital or clinic. It will not be stored at home. NOTE: This sheet is a summary. It may not cover all possible information. If you have questions about this medicine, talk to your doctor, pharmacist, or health care provider.  2023 Elsevier/Gold Standard (2021-09-27 00:00:00)  Fluorouracil Injection What is this medication? FLUOROURACIL (flure oh YOOR a sil) treats some types of cancer. It works by slowing down the growth of cancer cells. This medicine may be used for other purposes; ask your health care provider or pharmacist if you have questions. COMMON BRAND NAME(S): Adrucil What should I tell my care team before I take this medication? They need to know if you have any of these conditions: Blood disorders Dihydropyrimidine dehydrogenase (DPD) deficiency Infection, such as chickenpox, cold sores, herpes Kidney disease Liver disease Poor nutrition Recent or ongoing radiation therapy An unusual or allergic  reaction to fluorouracil, other medications, foods, dyes, or preservatives If you or your partner are pregnant or trying to get pregnant Breast-feeding How should I use this medication? This medication is injected into a vein. It is administered by your care team in a hospital or clinic setting. Talk to your care team about the use of this medication in children. Special care may be needed. Overdosage: If you think you have taken too much of this medicine contact a poison control center or emergency room at once. NOTE: This medicine is only for you. Do not share this medicine with others. What if I miss a dose? Keep appointments for follow-up doses. It is important not to miss your dose. Call your care team if you are unable to keep an appointment. What may interact with this medication? Do not take this medication with any of the following: Live virus vaccines This medication may also interact with the following: Medications that treat  or prevent blood clots, such as warfarin, enoxaparin, dalteparin This list may not describe all possible interactions. Give your health care provider a list of all the medicines, herbs, non-prescription drugs, or dietary supplements you use. Also tell them if you smoke, drink alcohol, or use illegal drugs. Some items may interact with your medicine. What should I watch for while using this medication? Your condition will be monitored carefully while you are receiving this medication. This medication may make you feel generally unwell. This is not uncommon as chemotherapy can affect healthy cells as well as cancer cells. Report any side effects. Continue your course of treatment even though you feel ill unless your care team tells you to stop. In some cases, you may be given additional medications to help with side effects. Follow all directions for their use. This medication may increase your risk of getting an infection. Call your care team for advice if you get a fever, chills, sore throat, or other symptoms of a cold or flu. Do not treat yourself. Try to avoid being around people who are sick. This medication may increase your risk to bruise or bleed. Call your care team if you notice any unusual bleeding. Be careful brushing or flossing your teeth or using a toothpick because you may get an infection or bleed more easily. If you have any dental work done, tell your dentist you are receiving this medication. Avoid taking medications that contain aspirin, acetaminophen, ibuprofen, naproxen, or ketoprofen unless instructed by your care team. These medications may hide a fever. Do not treat diarrhea with over the counter products. Contact your care team if you have diarrhea that lasts more than 2 days or if it is severe and watery. This medication can make you more sensitive to the sun. Keep out of the sun. If you cannot avoid being in the sun, wear protective clothing and sunscreen. Do not use sun lamps,  tanning beds, or tanning booths. Talk to your care team if you or your partner wish to become pregnant or think you might be pregnant. This medication can cause serious birth defects if taken during pregnancy and for 3 months after the last dose. A reliable form of contraception is recommended while taking this medication and for 3 months after the last dose. Talk to your care team about effective forms of contraception. Do not father a child while taking this medication and for 3 months after the last dose. Use a condom while having sex during this time period. Do not breastfeed while taking this medication.  This medication may cause infertility. Talk to your care team if you are concerned about your fertility. What side effects may I notice from receiving this medication? Side effects that you should report to your care team as soon as possible: Allergic reactions--skin rash, itching, hives, swelling of the face, lips, tongue, or throat Heart attack--pain or tightness in the chest, shoulders, arms, or jaw, nausea, shortness of breath, cold or clammy skin, feeling faint or lightheaded Heart failure--shortness of breath, swelling of the ankles, feet, or hands, sudden weight gain, unusual weakness or fatigue Heart rhythm changes--fast or irregular heartbeat, dizziness, feeling faint or lightheaded, chest pain, trouble breathing High ammonia level--unusual weakness or fatigue, confusion, loss of appetite, nausea, vomiting, seizures Infection--fever, chills, cough, sore throat, wounds that don't heal, pain or trouble when passing urine, general feeling of discomfort or being unwell Low red blood cell level--unusual weakness or fatigue, dizziness, headache, trouble breathing Pain, tingling, or numbness in the hands or feet, muscle weakness, change in vision, confusion or trouble speaking, loss of balance or coordination, trouble walking, seizures Redness, swelling, and blistering of the skin over hands and  feet Severe or prolonged diarrhea Unusual bruising or bleeding Side effects that usually do not require medical attention (report to your care team if they continue or are bothersome): Dry skin Headache Increased tears Nausea Pain, redness, or swelling with sores inside the mouth or throat Sensitivity to light Vomiting This list may not describe all possible side effects. Call your doctor for medical advice about side effects. You may report side effects to FDA at 1-800-FDA-1088. Where should I keep my medication? This medication is given in a hospital or clinic. It will not be stored at home. NOTE: This sheet is a summary. It may not cover all possible information. If you have questions about this medicine, talk to your doctor, pharmacist, or health care provider.  2023 Elsevier/Gold Standard (2021-08-23 00:00:00)

## 2022-05-31 NOTE — Progress Notes (Signed)
Nutrition follow up completed with patient and wife in infusion. Patient is receiving cycle 3 of FOLFOX. He is followed by Dr. Benay Spice.  Weight documented as 264# on 1/24, decreased from 272 # 3.2 oz Jan 10. This is a 3% loss over 2 weeks. Unclear how much weight loss was due to fluid shifts.  Labs include glucose 64.  Patient denies nutrition side effects. He had prolonged cold sensitivity so oxaliplatin dosage decreased this cycle. He has added a B supplement. Noted trace LEE documented at cardiology visit Jan 17.  He has only been drinking 1 carton of Ensure Complete or Boost plus. He wants to eat a salad.  Nutrition Diagnosis: Unintended wt loss ongoing but total amount could be skewed with fluid shifts.  Intervention: Educated to continue high protein foods and increase ONS to 2 bottles daily. Per RN, Merceda Elks, patient can try a small salad and see how he feels. Encouraged patient to slowly add foods with more fiber and assess tolerance before resuming "regular" diet.   Monitoring, Evaluation, Goals: Patient will tolerate adequate calories and protein for maintenance of lean body mass.  Next Visit: Wednesday, Feb 21 during infusion.

## 2022-05-31 NOTE — Progress Notes (Signed)
Patient seen by Dr. Benay Spice today  Vitals are within treatment parameters.  Labs reviewed by Dr. Benay Spice and are within treatment parameters.  Per physician team, patient is ready for treatment. Please note that modifications are being made to the treatment plan including Oxaliplatin dose reduced to 160 mg.

## 2022-06-02 ENCOUNTER — Inpatient Hospital Stay: Payer: Medicare HMO

## 2022-06-02 VITALS — BP 105/70 | HR 75 | Temp 98.2°F | Resp 18

## 2022-06-02 DIAGNOSIS — C168 Malignant neoplasm of overlapping sites of stomach: Secondary | ICD-10-CM

## 2022-06-02 DIAGNOSIS — Z5111 Encounter for antineoplastic chemotherapy: Secondary | ICD-10-CM | POA: Diagnosis not present

## 2022-06-02 MED ORDER — SODIUM CHLORIDE 0.9% FLUSH
10.0000 mL | INTRAVENOUS | Status: DC | PRN
  Administered 2022-06-02: 10 mL

## 2022-06-02 MED ORDER — HEPARIN SOD (PORK) LOCK FLUSH 100 UNIT/ML IV SOLN
500.0000 [IU] | Freq: Once | INTRAVENOUS | Status: AC | PRN
  Administered 2022-06-02: 500 [IU]

## 2022-06-02 NOTE — Patient Instructions (Signed)

## 2022-06-11 ENCOUNTER — Other Ambulatory Visit: Payer: Self-pay | Admitting: Nurse Practitioner

## 2022-06-11 ENCOUNTER — Other Ambulatory Visit: Payer: Self-pay | Admitting: Oncology

## 2022-06-11 ENCOUNTER — Other Ambulatory Visit: Payer: Self-pay | Admitting: Cardiology

## 2022-06-11 DIAGNOSIS — C168 Malignant neoplasm of overlapping sites of stomach: Secondary | ICD-10-CM

## 2022-06-14 ENCOUNTER — Inpatient Hospital Stay: Payer: Medicare HMO

## 2022-06-14 ENCOUNTER — Encounter: Payer: Self-pay | Admitting: Nurse Practitioner

## 2022-06-14 ENCOUNTER — Other Ambulatory Visit: Payer: Self-pay

## 2022-06-14 ENCOUNTER — Inpatient Hospital Stay: Payer: Medicare HMO | Admitting: Nutrition

## 2022-06-14 ENCOUNTER — Inpatient Hospital Stay: Payer: Medicare HMO | Attending: Nurse Practitioner

## 2022-06-14 ENCOUNTER — Inpatient Hospital Stay: Payer: Medicare HMO | Admitting: Nurse Practitioner

## 2022-06-14 VITALS — BP 121/75 | HR 70

## 2022-06-14 VITALS — BP 107/66 | HR 76 | Temp 98.2°F | Resp 20 | Ht 72.0 in | Wt 266.0 lb

## 2022-06-14 DIAGNOSIS — I4891 Unspecified atrial fibrillation: Secondary | ICD-10-CM | POA: Insufficient documentation

## 2022-06-14 DIAGNOSIS — L439 Lichen planus, unspecified: Secondary | ICD-10-CM | POA: Insufficient documentation

## 2022-06-14 DIAGNOSIS — Z5111 Encounter for antineoplastic chemotherapy: Secondary | ICD-10-CM | POA: Diagnosis present

## 2022-06-14 DIAGNOSIS — I959 Hypotension, unspecified: Secondary | ICD-10-CM | POA: Insufficient documentation

## 2022-06-14 DIAGNOSIS — C169 Malignant neoplasm of stomach, unspecified: Secondary | ICD-10-CM | POA: Insufficient documentation

## 2022-06-14 DIAGNOSIS — E039 Hypothyroidism, unspecified: Secondary | ICD-10-CM | POA: Diagnosis not present

## 2022-06-14 DIAGNOSIS — R21 Rash and other nonspecific skin eruption: Secondary | ICD-10-CM | POA: Diagnosis not present

## 2022-06-14 DIAGNOSIS — C168 Malignant neoplasm of overlapping sites of stomach: Secondary | ICD-10-CM

## 2022-06-14 DIAGNOSIS — R000 Tachycardia, unspecified: Secondary | ICD-10-CM | POA: Diagnosis not present

## 2022-06-14 DIAGNOSIS — R059 Cough, unspecified: Secondary | ICD-10-CM | POA: Diagnosis not present

## 2022-06-14 DIAGNOSIS — R062 Wheezing: Secondary | ICD-10-CM | POA: Diagnosis not present

## 2022-06-14 LAB — CBC WITH DIFFERENTIAL (CANCER CENTER ONLY)
Abs Immature Granulocytes: 0.04 10*3/uL (ref 0.00–0.07)
Basophils Absolute: 0.1 10*3/uL (ref 0.0–0.1)
Basophils Relative: 1 %
Eosinophils Absolute: 0.1 10*3/uL (ref 0.0–0.5)
Eosinophils Relative: 1 %
HCT: 32.8 % — ABNORMAL LOW (ref 39.0–52.0)
Hemoglobin: 10.7 g/dL — ABNORMAL LOW (ref 13.0–17.0)
Immature Granulocytes: 0 %
Lymphocytes Relative: 19 %
Lymphs Abs: 1.9 10*3/uL (ref 0.7–4.0)
MCH: 27.6 pg (ref 26.0–34.0)
MCHC: 32.6 g/dL (ref 30.0–36.0)
MCV: 84.5 fL (ref 80.0–100.0)
Monocytes Absolute: 0.9 10*3/uL (ref 0.1–1.0)
Monocytes Relative: 9 %
Neutro Abs: 7.2 10*3/uL (ref 1.7–7.7)
Neutrophils Relative %: 70 %
Platelet Count: 141 10*3/uL — ABNORMAL LOW (ref 150–400)
RBC: 3.88 MIL/uL — ABNORMAL LOW (ref 4.22–5.81)
RDW: 17.1 % — ABNORMAL HIGH (ref 11.5–15.5)
WBC Count: 10.2 10*3/uL (ref 4.0–10.5)
nRBC: 0 % (ref 0.0–0.2)

## 2022-06-14 LAB — CMP (CANCER CENTER ONLY)
ALT: 8 U/L (ref 0–44)
AST: 13 U/L — ABNORMAL LOW (ref 15–41)
Albumin: 3.5 g/dL (ref 3.5–5.0)
Alkaline Phosphatase: 65 U/L (ref 38–126)
Anion gap: 10 (ref 5–15)
BUN: 17 mg/dL (ref 8–23)
CO2: 25 mmol/L (ref 22–32)
Calcium: 9.1 mg/dL (ref 8.9–10.3)
Chloride: 105 mmol/L (ref 98–111)
Creatinine: 0.94 mg/dL (ref 0.61–1.24)
GFR, Estimated: >60 mL/min (ref >60)
Glucose, Bld: 100 mg/dL — ABNORMAL HIGH (ref 70–99)
Potassium: 3.6 mmol/L (ref 3.5–5.1)
Sodium: 140 mmol/L (ref 135–145)
Total Bilirubin: 0.5 mg/dL (ref 0.3–1.2)
Total Protein: 6.4 g/dL — ABNORMAL LOW (ref 6.5–8.1)

## 2022-06-14 MED ORDER — SODIUM CHLORIDE 0.9 % IV SOLN
10.0000 mg | Freq: Once | INTRAVENOUS | Status: AC
  Administered 2022-06-14: 10 mg via INTRAVENOUS
  Filled 2022-06-14: qty 1

## 2022-06-14 MED ORDER — OXALIPLATIN CHEMO INJECTION 100 MG/20ML
65.0000 mg/m2 | Freq: Once | INTRAVENOUS | Status: AC
  Administered 2022-06-14: 150 mg via INTRAVENOUS
  Filled 2022-06-14: qty 20

## 2022-06-14 MED ORDER — FLUOROURACIL CHEMO INJECTION 2.5 GM/50ML
400.0000 mg/m2 | Freq: Once | INTRAVENOUS | Status: AC
  Administered 2022-06-14: 1000 mg via INTRAVENOUS
  Filled 2022-06-14: qty 20

## 2022-06-14 MED ORDER — SODIUM CHLORIDE 0.9 % IV SOLN
2000.0000 mg/m2 | INTRAVENOUS | Status: DC
  Administered 2022-06-14: 5000 mg via INTRAVENOUS
  Filled 2022-06-14: qty 100

## 2022-06-14 MED ORDER — DEXTROSE 5 % IV SOLN: Freq: Once | INTRAVENOUS | Status: AC

## 2022-06-14 MED ORDER — LEUCOVORIN CALCIUM INJECTION 350 MG
400.0000 mg/m2 | Freq: Once | INTRAVENOUS | Status: AC
  Administered 2022-06-14: 992 mg via INTRAVENOUS
  Filled 2022-06-14: qty 49.6

## 2022-06-14 MED ORDER — PREDNISONE 20 MG PO TABS: ORAL_TABLET | ORAL | 0 refills | Status: DC

## 2022-06-14 MED ORDER — PALONOSETRON HCL INJECTION 0.25 MG/5ML
0.2500 mg | Freq: Once | INTRAVENOUS | Status: AC
  Administered 2022-06-14: 0.25 mg via INTRAVENOUS
  Filled 2022-06-14: qty 5

## 2022-06-14 NOTE — Patient Instructions (Addendum)
[facility]   The chemotherapy medication bag should finish at 46 hours, 96 hours, or 7 days. For example, if your pump is scheduled for 46 hours and it was put on at 4:00 p.m., it should finish at 2:00 p.m. the day it is scheduled to come off regardless of your appointment time.     Estimated time to finish at 12:30 Friday, June 16, 2022.   If the display on your pump reads "Low Volume" and it is beeping, take the batteries out of the pump and come to the cancer center for it to be taken off.   If the pump alarms go off prior to the pump reading "Low Volume" then call [phone] and someone can assist you.  If the plunger comes out and the chemotherapy medication is leaking out, please use your home chemo spill kit to clean up the spill. Do NOT use paper towels or other household products.  If you have problems or questions regarding your pump, please call either [phone] (24 hours a day) or the cancer center Monday-Friday 8:00 a.m.- 4:30 p.m. at the clinic number and we will assist you. If you are unable to get assistance, then go to the nearest Emergency Department and ask the staff to contact the IV team for assistance.  Discharge Instructions: Thank you for choosing [facility] to provide your oncology and hematology care.   If you have a lab appointment with the [facility], please go directly to the [facility] and check in at the registration area.   Wear comfortable clothing and clothing appropriate for easy access to any Portacath or PICC line.   We strive to give you quality time with your provider. You may need to reschedule your appointment if you arrive late (15 or more minutes).  Arriving late affects you and other patients whose appointments are after yours.  Also, if you miss three or more appointments without notifying the office, you may be dismissed from the clinic at the provider's discretion.      For  prescription refill requests, have your pharmacy contact our office and allow 72 hours for refills to be completed.    Today you received the following chemotherapy and/or immunotherapy agents Oxaliplatin, Leucovorin, Fluorouracil.      To help prevent nausea and vomiting after your treatment, we encourage you to take your nausea medication as directed.  BELOW ARE SYMPTOMS THAT SHOULD BE REPORTED IMMEDIATELY: *FEVER GREATER THAN 100.4 F (38 C) OR HIGHER *CHILLS OR SWEATING *NAUSEA AND VOMITING THAT IS NOT CONTROLLED WITH YOUR NAUSEA MEDICATION *UNUSUAL SHORTNESS OF BREATH *UNUSUAL BRUISING OR BLEEDING *URINARY PROBLEMS (pain or burning when urinating, or frequent urination) *BOWEL PROBLEMS (unusual diarrhea, constipation, pain near the anus) TENDERNESS IN MOUTH AND THROAT WITH OR WITHOUT PRESENCE OF ULCERS (sore throat, sores in mouth, or a toothache) UNUSUAL RASH, SWELLING OR PAIN  UNUSUAL VAGINAL DISCHARGE OR ITCHING   Items with * indicate a potential emergency and should be followed up as soon as possible or go to the Emergency Department if any problems should occur.  Please show the CHEMOTHERAPY ALERT CARD or IMMUNOTHERAPY ALERT CARD at check-in to the Emergency Department and triage nurse.  Should you have questions after your visit or need to cancel or reschedule your appointment, please contact [facility]  Dept: [phone]  and follow the prompts.  Office hours are 8:00 a.m. to 4:30 p.m. Monday - Friday. Please note  that voicemails left after 4:00 p.m. may not be returned until the following business day.  We are closed weekends and major holidays. You have access to a nurse at all times for urgent questions. Please call the main number to the clinic Dept: [phone] and follow the prompts.   For any non-urgent questions, you may also contact your provider using MyChart. We now offer e-Visits for anyone 44 and older to request care online  for non-urgent symptoms. For details visit [email or web address].   Also download the MyChart app! Go to the app store, search "MyChart", open the app, select [facility], and log in with your MyChart username and password.  Oxaliplatin Injection What is this medication? OXALIPLATIN (ox AL i PLA tin) treats some types of cancer. It works by slowing down the growth of cancer cells. This medicine may be used for other purposes; ask your health care provider or pharmacist if you have questions. COMMON BRAND NAME(S): Eloxatin What should I tell my care team before I take this medication? They need to know if you have any of these conditions: Heart disease History of irregular heartbeat or rhythm Liver disease Low blood cell levels (white cells, red cells, and platelets) Lung or breathing disease, such as asthma Take medications that treat or prevent blood clots Tingling of the fingers, toes, or other nerve disorder An unusual or allergic reaction to oxaliplatin, other medications, foods, dyes, or preservatives If you or your partner are pregnant or trying to get pregnant Breast-feeding How should I use this medication? This medication is injected into a vein. It is given by your care team in a hospital or clinic setting. Talk to your care team about the use of this medication in children. Special care may be needed. Overdosage: If you think you have taken too much of this medicine contact a poison control center or emergency room at once. NOTE: This medicine is only for you. Do not share this medicine with others. What if I miss a dose? Keep appointments for follow-up doses. It is important not to miss a dose. Call your care team if you are unable to keep an appointment. What may interact with this medication? Do not take this medication with any of the following: Cisapride Dronedarone Pimozide Thioridazine This medication may also interact with the following: Aspirin and aspirin-like  medications Certain medications that treat or prevent blood clots, such as warfarin, apixaban, dabigatran, and rivaroxaban Cisplatin Cyclosporine Diuretics Medications for infection, such as acyclovir, adefovir, amphotericin B, bacitracin, cidofovir, foscarnet, ganciclovir, gentamicin, pentamidine, vancomycin NSAIDs, medications for pain and inflammation, such as ibuprofen or naproxen Other medications that cause heart rhythm changes Pamidronate Zoledronic acid This list may not describe all possible interactions. Give your health care provider a list of all the medicines, herbs, non-prescription drugs, or dietary supplements you use. Also tell them if you smoke, drink alcohol, or use illegal drugs. Some items may interact with your medicine. What should I watch for while using this medication? Your condition will be monitored carefully while you are receiving this medication. You may need blood work while taking this medication. This medication may make you feel generally unwell. This is not uncommon as chemotherapy can affect healthy cells as well as cancer cells. Report any side effects. Continue your course of treatment even though you feel ill unless your care team tells you to stop. This medication may increase your risk of getting an infection. Call your care team for advice if you get a fever, chills, sore  throat, or other symptoms of a cold or flu. Do not treat yourself. Try to avoid being around people who are sick. Avoid taking medications that contain aspirin, acetaminophen, ibuprofen, naproxen, or ketoprofen unless instructed by your care team. These medications may hide a fever. Be careful brushing or flossing your teeth or using a toothpick because you may get an infection or bleed more easily. If you have any dental work done, tell your dentist you are receiving this medication. This medication can make you more sensitive to cold. Do not drink cold drinks or use ice. Cover exposed  skin before coming in contact with cold temperatures or cold objects. When out in cold weather wear warm clothing and cover your mouth and nose to warm the air that goes into your lungs. Tell your care team if you get sensitive to the cold. Talk to your care team if you or your partner are pregnant or think either of you might be pregnant. This medication can cause serious birth defects if taken during pregnancy and for 9 months after the last dose. A negative pregnancy test is required before starting this medication. A reliable form of contraception is recommended while taking this medication and for 9 months after the last dose. Talk to your care team about effective forms of contraception. Do not father a child while taking this medication and for 6 months after the last dose. Use a condom while having sex during this time period. Do not breastfeed while taking this medication and for 3 months after the last dose. This medication may cause infertility. Talk to your care team if you are concerned about your fertility. What side effects may I notice from receiving this medication? Side effects that you should report to your care team as soon as possible: Allergic reactions--skin rash, itching, hives, swelling of the face, lips, tongue, or throat Bleeding--bloody or black, tar-like stools, vomiting blood or brown material that looks like coffee grounds, red or dark brown urine, small red or purple spots on skin, unusual bruising or bleeding Dry cough, shortness of breath or trouble breathing Heart rhythm changes--fast or irregular heartbeat, dizziness, feeling faint or lightheaded, chest pain, trouble breathing Infection--fever, chills, cough, sore throat, wounds that don't heal, pain or trouble when passing urine, general feeling of discomfort or being unwell Liver injury--right upper belly pain, loss of appetite, nausea, light-colored stool, dark yellow or brown urine, yellowing skin or eyes, unusual  weakness or fatigue Low red blood cell level--unusual weakness or fatigue, dizziness, headache, trouble breathing Muscle injury--unusual weakness or fatigue, muscle pain, dark yellow or brown urine, decrease in amount of urine Pain, tingling, or numbness in the hands or feet Sudden and severe headache, confusion, change in vision, seizures, which may be signs of posterior reversible encephalopathy syndrome (PRES) Unusual bruising or bleeding Side effects that usually do not require medical attention (report to your care team if they continue or are bothersome): Diarrhea Nausea Pain, redness, or swelling with sores inside the mouth or throat Unusual weakness or fatigue Vomiting This list may not describe all possible side effects. Call your doctor for medical advice about side effects. You may report side effects to FDA at 1-800-FDA-1088. Where should I keep my medication? This medication is given in a hospital or clinic. It will not be stored at home. NOTE: This sheet is a summary. It may not cover all possible information. If you have questions about this medicine, talk to your doctor, pharmacist, or health care provider.  2023  Elsevier/Gold Standard (2007-06-15 00:00:00) Leucovorin Injection What is this medication? LEUCOVORIN (loo koe VOR in) prevents side effects from certain medications, such as methotrexate. It works by increasing folate levels. This helps protect healthy cells in your body. It may also be used to treat anemia caused by low levels of folate. It can also be used with fluorouracil, a type of chemotherapy, to treat colorectal cancer. It works by increasing the effects of fluorouracil in the body. This medicine may be used for other purposes; ask your health care provider or pharmacist if you have questions. What should I tell my care team before I take this medication? They need to know if you have any of these conditions: Anemia from low levels of vitamin B12 in the  blood An unusual or allergic reaction to leucovorin, folic acid, other medications, foods, dyes, or preservatives Pregnant or trying to get pregnant Breastfeeding How should I use this medication? This medication is injected into a vein or a muscle. It is given by your care team in a hospital or clinic setting. Talk to your care team about the use of this medication in children. Special care may be needed. Overdosage: If you think you have taken too much of this medicine contact a poison control center or emergency room at once. NOTE: This medicine is only for you. Do not share this medicine with others. What if I miss a dose? Keep appointments for follow-up doses. It is important not to miss your dose. Call your care team if you are unable to keep an appointment. What may interact with this medication? Capecitabine Fluorouracil Phenobarbital Phenytoin Primidone Trimethoprim;sulfamethoxazole This list may not describe all possible interactions. Give your health care provider a list of all the medicines, herbs, non-prescription drugs, or dietary supplements you use. Also tell them if you smoke, drink alcohol, or use illegal drugs. Some items may interact with your medicine. What should I watch for while using this medication? Your condition will be monitored carefully while you are receiving this medication. This medication may increase the side effects of 5-fluorouracil. Tell your care team if you have diarrhea or mouth sores that do not get better or that get worse. What side effects may I notice from receiving this medication? Side effects that you should report to your care team as soon as possible: Allergic reactions--skin rash, itching, hives, swelling of the face, lips, tongue, or throat This list may not describe all possible side effects. Call your doctor for medical advice about side effects. You may report side effects to FDA at 1-800-FDA-1088. Where should I keep my  medication? This medication is given in a hospital or clinic. It will not be stored at home. NOTE: This sheet is a summary. It may not cover all possible information. If you have questions about this medicine, talk to your doctor, pharmacist, or health care provider.  2023 Elsevier/Gold Standard (2021-09-27 00:00:00) Fluorouracil Injection What is this medication? FLUOROURACIL (flure oh YOOR a sil) treats some types of cancer. It works by slowing down the growth of cancer cells. This medicine may be used for other purposes; ask your health care provider or pharmacist if you have questions. COMMON BRAND NAME(S): Adrucil What should I tell my care team before I take this medication? They need to know if you have any of these conditions: Blood disorders Dihydropyrimidine dehydrogenase (DPD) deficiency Infection, such as chickenpox, cold sores, herpes Kidney disease Liver disease Poor nutrition Recent or ongoing radiation therapy An unusual or allergic reaction to  fluorouracil, other medications, foods, dyes, or preservatives If you or your partner are pregnant or trying to get pregnant Breast-feeding How should I use this medication? This medication is injected into a vein. It is administered by your care team in a hospital or clinic setting. Talk to your care team about the use of this medication in children. Special care may be needed. Overdosage: If you think you have taken too much of this medicine contact a poison control center or emergency room at once. NOTE: This medicine is only for you. Do not share this medicine with others. What if I miss a dose? Keep appointments for follow-up doses. It is important not to miss your dose. Call your care team if you are unable to keep an appointment. What may interact with this medication? Do not take this medication with any of the following: Live virus vaccines This medication may also interact with the following: Medications that treat or  prevent blood clots, such as warfarin, enoxaparin, dalteparin This list may not describe all possible interactions. Give your health care provider a list of all the medicines, herbs, non-prescription drugs, or dietary supplements you use. Also tell them if you smoke, drink alcohol, or use illegal drugs. Some items may interact with your medicine. What should I watch for while using this medication? Your condition will be monitored carefully while you are receiving this medication. This medication may make you feel generally unwell. This is not uncommon as chemotherapy can affect healthy cells as well as cancer cells. Report any side effects. Continue your course of treatment even though you feel ill unless your care team tells you to stop. In some cases, you may be given additional medications to help with side effects. Follow all directions for their use. This medication may increase your risk of getting an infection. Call your care team for advice if you get a fever, chills, sore throat, or other symptoms of a cold or flu. Do not treat yourself. Try to avoid being around people who are sick. This medication may increase your risk to bruise or bleed. Call your care team if you notice any unusual bleeding. Be careful brushing or flossing your teeth or using a toothpick because you may get an infection or bleed more easily. If you have any dental work done, tell your dentist you are receiving this medication. Avoid taking medications that contain aspirin, acetaminophen, ibuprofen, naproxen, or ketoprofen unless instructed by your care team. These medications may hide a fever. Do not treat diarrhea with over the counter products. Contact your care team if you have diarrhea that lasts more than 2 days or if it is severe and watery. This medication can make you more sensitive to the sun. Keep out of the sun. If you cannot avoid being in the sun, wear protective clothing and sunscreen. Do not use sun lamps,  tanning beds, or tanning booths. Talk to your care team if you or your partner wish to become pregnant or think you might be pregnant. This medication can cause serious birth defects if taken during pregnancy and for 3 months after the last dose. A reliable form of contraception is recommended while taking this medication and for 3 months after the last dose. Talk to your care team about effective forms of contraception. Do not father a child while taking this medication and for 3 months after the last dose. Use a condom while having sex during this time period. Do not breastfeed while taking this medication. This medication  may cause infertility. Talk to your care team if you are concerned about your fertility. What side effects may I notice from receiving this medication? Side effects that you should report to your care team as soon as possible: Allergic reactions--skin rash, itching, hives, swelling of the face, lips, tongue, or throat Heart attack--pain or tightness in the chest, shoulders, arms, or jaw, nausea, shortness of breath, cold or clammy skin, feeling faint or lightheaded Heart failure--shortness of breath, swelling of the ankles, feet, or hands, sudden weight gain, unusual weakness or fatigue Heart rhythm changes--fast or irregular heartbeat, dizziness, feeling faint or lightheaded, chest pain, trouble breathing High ammonia level--unusual weakness or fatigue, confusion, loss of appetite, nausea, vomiting, seizures Infection--fever, chills, cough, sore throat, wounds that don't heal, pain or trouble when passing urine, general feeling of discomfort or being unwell Low red blood cell level--unusual weakness or fatigue, dizziness, headache, trouble breathing Pain, tingling, or numbness in the hands or feet, muscle weakness, change in vision, confusion or trouble speaking, loss of balance or coordination, trouble walking, seizures Redness, swelling, and blistering of the skin over hands and  feet Severe or prolonged diarrhea Unusual bruising or bleeding Side effects that usually do not require medical attention (report to your care team if they continue or are bothersome): Dry skin Headache Increased tears Nausea Pain, redness, or swelling with sores inside the mouth or throat Sensitivity to light Vomiting This list may not describe all possible side effects. Call your doctor for medical advice about side effects. You may report side effects to FDA at 1-800-FDA-1088. Where should I keep my medication? This medication is given in a hospital or clinic. It will not be stored at home. NOTE: This sheet is a summary. It may not cover all possible information. If you have questions about this medicine, talk to your doctor, pharmacist, or health care provider.  2023 Elsevier/Gold Standard (2021-08-23 00:00:00)

## 2022-06-14 NOTE — Telephone Encounter (Signed)
Opened in error

## 2022-06-14 NOTE — Progress Notes (Signed)
  [facility] OFFICE PROGRESS NOTE   Diagnosis: Gastric cancer  INTERVAL HISTORY:   Todd Harrison returns as scheduled.  He completed cycle 3 FOLFOX 05/31/2022.  Oxaliplatin was dose reduced due to prolonged cold sensitivity.  The cold sensitivity was markedly less intense.  He has very mild persistent cold sensitivity.  No numbness or tingling in the absence of cold exposure.  He had mild nausea.  No vomiting.  No mouth sores.  No diarrhea.  He has intermittent reflux symptoms.  Objective:  Vital signs in last 24 hours:  Blood pressure 107/66, pulse 76, temperature 98.2 F (36.8 C), temperature source Oral, resp. rate 20, height 6' (1.829 m), weight 266 lb (120.7 kg), SpO2 98 %.    HEENT: No thrush or ulcers. Resp: Clear bilaterally. Cardio: Regular rate and rhythm. GI: Abdomen soft and nontender.  No hepatosplenomegaly.  Mild distention. Vascular: Trace edema lower leg bilaterally.  Right lower leg is slightly larger than the left lower leg. Skin: Palms without erythema. Port-A-Cath without erythema.   Lab Results:  Lab Results  Component Value Date   WBC 10.2 06/14/2022   HGB 10.7 (L) 06/14/2022   HCT 32.8 (L) 06/14/2022   MCV 84.5 06/14/2022   PLT 141 (L) 06/14/2022   NEUTROABS 7.2 06/14/2022    Imaging:  No results found.  Medications: I have reviewed the patient's current medications.  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  Internal hemorrhoids.  No specimens collected. Upper endoscopy  04/25/2022-large infiltrative mass with oozing bleeding and stigmata of recent bleeding in the gastric fundus, on the anterior wall of the gastric body and on the greater curvature of the gastric body; deformity in the gastric antrum.  Extrinsic deformity in the entire duodenum.  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 Her2 by IHC negative, 1+; MMR IHC normal; PD-L1 CPS 1%, positive.  Cycle 1 FOLFOX 05/04/2022 Cycle 2 FOLFOX 05/17/2022 Cycle 3 FOLFOX 05/31/2022-oxaliplatin dose reduced secondary to prolonged cold sensitivity Cycle 4 FOLFOX 06/14/2022 Truncal rash-status post recent punch biopsy with pathology pending; biopsy reported negative per family 41/96/2229 Lichen planus of the feet Hypothyroidism Atrial fibrillation  Disposition: Todd Harrison appears stable.  He has completed 3 cycles of FOLFOX.  Oxaliplatin was dose reduced with cycle 3 due to prolonged cold sensitivity.  The cold sensitivity has improved.  Plan to proceed with cycle 4 FOLFOX today as scheduled.  Restaging CTs after cycle 5.  CBC and chemistry panel reviewed.  Labs adequate to proceed as above.  He will return for lab, follow-up, cycle 5 FOLFOX in 2 weeks.  We are available to see him sooner if needed.  Ned Card ANP/GNP-BC   06/14/2022  10:09 AM

## 2022-06-14 NOTE — Progress Notes (Signed)
Patient seen by Lisa Thomas NP today  Vitals are within treatment parameters.  Labs reviewed by Lisa Thomas NP and are within treatment parameters.  Per physician team, patient is ready for treatment and there are NO modifications to the treatment plan.     

## 2022-06-15 ENCOUNTER — Encounter (HOSPITAL_COMMUNITY): Payer: Self-pay | Admitting: *Deleted

## 2022-06-16 ENCOUNTER — Inpatient Hospital Stay: Payer: Medicare HMO

## 2022-06-16 VITALS — BP 108/71 | HR 72 | Temp 98.3°F | Resp 20

## 2022-06-16 DIAGNOSIS — Z5111 Encounter for antineoplastic chemotherapy: Secondary | ICD-10-CM | POA: Diagnosis not present

## 2022-06-16 DIAGNOSIS — C168 Malignant neoplasm of overlapping sites of stomach: Secondary | ICD-10-CM

## 2022-06-16 MED ORDER — HEPARIN SOD (PORK) LOCK FLUSH 100 UNIT/ML IV SOLN
500.0000 [IU] | Freq: Once | INTRAVENOUS | Status: AC | PRN
  Administered 2022-06-16: 500 [IU]

## 2022-06-16 MED ORDER — SODIUM CHLORIDE 0.9% FLUSH
10.0000 mL | INTRAVENOUS | Status: DC | PRN
  Administered 2022-06-16: 10 mL

## 2022-06-16 NOTE — Patient Instructions (Signed)

## 2022-06-22 ENCOUNTER — Ambulatory Visit (HOSPITAL_COMMUNITY): Payer: Medicare HMO | Attending: Cardiology

## 2022-06-22 DIAGNOSIS — I4891 Unspecified atrial fibrillation: Secondary | ICD-10-CM | POA: Diagnosis present

## 2022-06-22 DIAGNOSIS — I77810 Thoracic aortic ectasia: Secondary | ICD-10-CM | POA: Diagnosis not present

## 2022-06-22 DIAGNOSIS — I4819 Other persistent atrial fibrillation: Secondary | ICD-10-CM

## 2022-06-22 DIAGNOSIS — R011 Cardiac murmur, unspecified: Secondary | ICD-10-CM | POA: Insufficient documentation

## 2022-06-22 LAB — ECHOCARDIOGRAM COMPLETE
AR max vel: 1.98 cm2
AV Area VTI: 2.05 cm2
AV Area mean vel: 1.96 cm2
AV Mean grad: 14.4 mmHg
AV Peak grad: 25.6 mmHg
Ao pk vel: 2.53 m/s
Area-P 1/2: 3.19 cm2
S' Lateral: 3.3 cm

## 2022-06-23 ENCOUNTER — Inpatient Hospital Stay: Payer: Medicare HMO

## 2022-06-23 ENCOUNTER — Encounter: Payer: Self-pay | Admitting: Nurse Practitioner

## 2022-06-23 ENCOUNTER — Inpatient Hospital Stay: Payer: Medicare HMO | Admitting: Nurse Practitioner

## 2022-06-23 ENCOUNTER — Telehealth: Payer: Self-pay | Admitting: *Deleted

## 2022-06-23 ENCOUNTER — Ambulatory Visit (HOSPITAL_BASED_OUTPATIENT_CLINIC_OR_DEPARTMENT_OTHER)
Admission: RE | Admit: 2022-06-23 | Discharge: 2022-06-23 | Disposition: A | Discharge status: Home / Self Care | Payer: Medicare HMO | Source: Ambulatory Visit | Attending: Oncology | Admitting: Oncology

## 2022-06-23 ENCOUNTER — Other Ambulatory Visit: Payer: Self-pay | Admitting: *Deleted

## 2022-06-23 VITALS — BP 115/68 | HR 87 | Temp 98.2°F | Resp 18 | Ht 72.0 in | Wt 260.0 lb

## 2022-06-23 DIAGNOSIS — I350 Nonrheumatic aortic (valve) stenosis: Secondary | ICD-10-CM

## 2022-06-23 DIAGNOSIS — C168 Malignant neoplasm of overlapping sites of stomach: Secondary | ICD-10-CM | POA: Insufficient documentation

## 2022-06-23 DIAGNOSIS — J441 Chronic obstructive pulmonary disease with (acute) exacerbation: Secondary | ICD-10-CM

## 2022-06-23 DIAGNOSIS — I77810 Thoracic aortic ectasia: Secondary | ICD-10-CM

## 2022-06-23 DIAGNOSIS — Z5111 Encounter for antineoplastic chemotherapy: Secondary | ICD-10-CM | POA: Diagnosis not present

## 2022-06-23 LAB — CBC WITH DIFFERENTIAL (CANCER CENTER ONLY)
Abs Immature Granulocytes: 0.1 10*3/uL — ABNORMAL HIGH (ref 0.00–0.07)
Basophils Absolute: 0.1 10*3/uL (ref 0.0–0.1)
Basophils Relative: 1 %
Eosinophils Absolute: 0.1 10*3/uL (ref 0.0–0.5)
Eosinophils Relative: 1 %
HCT: 34 % — ABNORMAL LOW (ref 39.0–52.0)
Hemoglobin: 11 g/dL — ABNORMAL LOW (ref 13.0–17.0)
Immature Granulocytes: 1 %
Lymphocytes Relative: 23 %
Lymphs Abs: 1.6 10*3/uL (ref 0.7–4.0)
MCH: 27.6 pg (ref 26.0–34.0)
MCHC: 32.4 g/dL (ref 30.0–36.0)
MCV: 85.4 fL (ref 80.0–100.0)
Monocytes Absolute: 1.1 10*3/uL — ABNORMAL HIGH (ref 0.1–1.0)
Monocytes Relative: 16 %
Neutro Abs: 4.1 10*3/uL (ref 1.7–7.7)
Neutrophils Relative %: 58 %
Platelet Count: 177 10*3/uL (ref 150–400)
RBC: 3.98 MIL/uL — ABNORMAL LOW (ref 4.22–5.81)
RDW: 17.5 % — ABNORMAL HIGH (ref 11.5–15.5)
WBC Count: 7.1 10*3/uL (ref 4.0–10.5)
nRBC: 0 % (ref 0.0–0.2)

## 2022-06-23 MED ORDER — AZITHROMYCIN 250 MG PO TABS: ORAL_TABLET | ORAL | 0 refills | Status: DC

## 2022-06-23 MED ORDER — ALBUTEROL SULFATE HFA 108 (90 BASE) MCG/ACT IN AERS: 2.0000 | INHALATION_SPRAY | Freq: Four times a day (QID) | RESPIRATORY_TRACT | 1 refills | Status: DC | PRN

## 2022-06-23 MED ORDER — METHYLPREDNISOLONE 4 MG PO TBPK: ORAL_TABLET | ORAL | 0 refills | Status: DC

## 2022-06-23 NOTE — Telephone Encounter (Signed)
Wife reports he has had cough that is progressive over the last 4 weeks. It is productive and she hears him wheezing at times. He also reports feeling weaker. No fever and does not appear short of breath.  Sees PCP on 2/13, but feels this is too long to wait. Asking to be seen today.

## 2022-06-23 NOTE — Telephone Encounter (Addendum)
Called Todd Harrison and he reports the cough is progressive and he is wheezing and has intermittent shortness of breath w/activity. Has been trying OTC guaifenesin, but it has not been helpful. Cough is productive and also has a runny nose. COVID test today is negative. Per Dr. Benay Spice: See NP today at 2:15 with CXR prior. Todd Harrison agrees to this.

## 2022-06-23 NOTE — Telephone Encounter (Signed)
-----   Message from Freada Bergeron, MD sent at 06/22/2022  8:35 PM EST ----- His echo shows normal pumping function. He continues to have mild narrowing of his aortic valve and mild dilation of his aortic root. This is stable and we will continue with yearly echoes.

## 2022-06-23 NOTE — Progress Notes (Signed)
[facility] OFFICE PROGRESS NOTE   Diagnosis: Gastric cancer  INTERVAL HISTORY:   Todd Harrison returns prior to scheduled follow-up for evaluation of a cough.  He reports a "regular cough" up until 2 days ago when it became deeper.  The cough is intermittently productive.  He has noticed wheezing.  Occasional shortness of breath.  He does not feel short of breath at present.  No fever or shaking chills.  He reports a negative COVID test.  He reports a history of mild emphysema.  Objective:  Vital signs in last 24 hours:  Blood pressure 115/68, pulse 87, temperature 98.2 F (36.8 C), temperature source Oral, resp. rate 18, height 6' (1.829 m), weight 260 lb (117.9 kg), SpO2 96 %.    Resp: Scattered wheezes left greater than right lung field.  No respiratory distress. Cardio: Regular rate and rhythm. GI: Abdomen soft and nontender.  No hepatosplenomegaly. Vascular: Trace edema lower leg bilaterally right greater than left. Port-A-Cath without erythema.  Lab Results:  Lab Results  Component Value Date   WBC 7.1 06/23/2022   HGB 11.0 (L) 06/23/2022   HCT 34.0 (L) 06/23/2022   MCV 85.4 06/23/2022   PLT 177 06/23/2022   NEUTROABS 4.1 06/23/2022    Imaging:  DG Chest 2 View  Result Date: 06/23/2022 CLINICAL DATA:  Cough, wheezing, shortness of breath EXAM: CHEST - 2 VIEW COMPARISON:  12/14/2020 FINDINGS: Right Port-A-Cath remains in place with the tip in the SVC. No pneumothorax. Heart is normal size. Mediastinal contours within normal limits. Small right pleural effusion. Right basilar atelectasis or early infiltrate. Suspect small left effusion as well. No focal opacity on the left. No acute bony abnormality. IMPRESSION: Small bilateral pleural effusions. Right basilar atelectasis or infiltrate. Electronically Signed   By: Rolm Baptise M.D.   On: 06/23/2022 14:09   ECHOCARDIOGRAM COMPLETE  Result Date: 06/22/2022    ECHOCARDIOGRAM REPORT   Patient Name:   Todd Harrison Date of Exam: 06/22/2022 Medical Rec #:  [id number]       Height:       72.0 in Accession #:    [id number]      Weight:       266.0 lb Date of Birth:  24-Jun-1945      BSA:          2.405 m Patient Age:    77 years        BP:           107/66 mmHg Patient Gender: M               HR:           82 bpm. Exam Location:  [facility] Procedure: 2D Echo, Cardiac Doppler and Color Doppler Indications:    R01.1 Murmur                 I77.810 Dilated Aortic Root  History:        Patient has prior history of Echocardiogram examinations, most                 recent 01/23/2020. Arrythmias:Atrial Fibrillation; Risk                 Factors:Sleep Apnea.  Sonographer:    Cresenciano Lick RDCS Referring Phys: [id number] [place]  1. Left ventricular ejection fraction, by estimation, is 60 to 65%. The left ventricle has normal function. The left ventricle has no regional wall motion abnormalities. Left ventricular  diastolic parameters are indeterminate.  2. Right ventricular systolic function is normal. The right ventricular size is normal.  3. The mitral valve is normal in structure. Mild mitral valve regurgitation. No evidence of mitral stenosis.  4. The aortic valve is calcified. Aortic valve regurgitation is not visualized. Mild aortic valve stenosis. Aortic valve area, by VTI measures 2.05 cm. Aortic valve mean gradient measures 14.4 mmHg. Aortic valve Vmax measures 2.53 m/s.  5. There is dilatation of the ascending aorta, measuring 40 mm. There is dilatation of the aortic root, measuring 40 mm.  6. The inferior vena cava is normal in size with greater than 50% respiratory variability, suggesting right atrial pressure of 3 mmHg. FINDINGS  Left Ventricle: Left ventricular ejection fraction, by estimation, is 60 to 65%. The left ventricle has normal function. The left ventricle has no regional wall motion abnormalities. The left ventricular internal cavity size was normal in size. There is  no left  ventricular hypertrophy. Left ventricular diastolic parameters are indeterminate. Right Ventricle: The right ventricular size is normal. No increase in right ventricular wall thickness. Right ventricular systolic function is normal. Left Atrium: Left atrial size was normal in size. Right Atrium: Right atrial size was normal in size. Pericardium: There is no evidence of pericardial effusion. Mitral Valve: The mitral valve is normal in structure. Mild mitral valve regurgitation. No evidence of mitral valve stenosis. Tricuspid Valve: The tricuspid valve is normal in structure. Tricuspid valve regurgitation is not demonstrated. No evidence of tricuspid stenosis. Aortic Valve: The aortic valve is calcified. Aortic valve regurgitation is not visualized. Mild aortic stenosis is present. Aortic valve mean gradient measures 14.4 mmHg. Aortic valve peak gradient measures 25.6 mmHg. Aortic valve area, by VTI measures 2.05 cm. Pulmonic Valve: The pulmonic valve was normal in structure. Pulmonic valve regurgitation is not visualized. No evidence of pulmonic stenosis. Aorta: There is dilatation of the ascending aorta, measuring 40 mm. There is dilatation of the aortic root, measuring 40 mm. Venous: The inferior vena cava is normal in size with greater than 50% respiratory variability, suggesting right atrial pressure of 3 mmHg. IAS/Shunts: No atrial level shunt detected by color flow Doppler.  LEFT VENTRICLE PLAX 2D LVIDd:         4.80 cm   Diastology LVIDs:         3.30 cm   LV e' medial:    7.94 cm/s LV PW:         0.90 cm   LV E/e' medial:  9.9 LV IVS:        0.80 cm   LV e' lateral:   9.61 cm/s LVOT diam:     2.50 cm   LV E/e' lateral: 8.2 LV SV:         103 LV SV Index:   43 LVOT Area:     4.91 cm  RIGHT VENTRICLE RV Basal diam:  3.70 cm RV S prime:     12.25 cm/s TAPSE (M-mode): 2.7 cm LEFT ATRIUM             Index        RIGHT ATRIUM           Index LA diam:        4.70 cm 1.95 cm/m   RA Area:     19.40 cm LA Vol  (A2C):   61.5 ml 25.57 ml/m  RA Volume:   58.30 ml  24.24 ml/m LA Vol (A4C):   70.4 ml 29.27 ml/m LA Biplane  Vol: 67.6 ml 28.11 ml/m  AORTIC VALVE AV Area (Vmax):    1.98 cm AV Area (Vmean):   1.96 cm AV Area (VTI):     2.05 cm AV Vmax:           252.80 cm/s AV Vmean:          175.400 cm/s AV VTI:            0.500 m AV Peak Grad:      25.6 mmHg AV Mean Grad:      14.4 mmHg LVOT Vmax:         102.00 cm/s LVOT Vmean:        70.000 cm/s LVOT VTI:          0.209 m LVOT/AV VTI ratio: 0.42  AORTA Ao Root diam: 4.00 cm Ao Asc diam:  4.00 cm MITRAL VALVE MV Area (PHT): 3.19 cm    SHUNTS MV Decel Time: 238 msec    Systemic VTI:  0.21 m MV E velocity: 78.90 cm/s  Systemic Diam: 2.50 cm MV A velocity: 87.25 cm/s MV E/A ratio:  0.90 Kardie Tobb DO Electronically signed by Berniece Salines DO Signature Date/Time: 06/22/2022/1:42:31 PM    Final     Medications: I have reviewed the patient's current medications.  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  Internal hemorrhoids.  No specimens collected. Upper endoscopy 04/25/2022-large infiltrative mass with oozing bleeding and stigmata of recent bleeding in the gastric fundus, on the anterior wall of the gastric body and on the greater curvature of the gastric body; deformity in the gastric antrum.  Extrinsic deformity in the entire duodenum.  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 Her2 by IHC negative, 1+; MMR IHC normal; PD-L1 CPS 1%, positive.  Cycle 1 FOLFOX  05/04/2022 Cycle 2 FOLFOX 05/17/2022 Cycle 3 FOLFOX 05/31/2022-oxaliplatin dose reduced secondary to prolonged cold sensitivity Cycle 4 FOLFOX 06/14/2022 Truncal rash-status post recent punch biopsy with pathology pending; biopsy reported negative per family [id number] Lichen planus of the feet Hypothyroidism Atrial fibrillation  Disposition: Todd Harrison appears stable.  He has completed 4 cycles of FOLFOX.  He is seen today for evaluation of a cough and wheezing.  We reviewed the chest x-ray results/images.  No obvious pneumonia.  He may be experiencing a COPD exacerbation.  Prescription sent to his pharmacy for an albuterol inhaler, Z-Pak and Medrol Dosepak.  He understands to seek evaluation in the emergency department if symptoms worsen over the weekend.  Otherwise he will follow-up with [place] as scheduled next week.  Patient seen with Dr. Benay Spice.    Ned Card ANP/GNP-BC   06/23/2022  2:12 PM  This was a shared visit with Ned Card.  Todd Harrison was interviewed and examined.  We reviewed the chest x-ray images.  His cough has increased above baseline.  I suspect he has a COPD flare versus bronchitis.  We prescribed bronchodilator therapy, a Medrol Dosepak, and azithromycin.  He will return for the next cycle of chemotherapy as scheduled on 06/28/2022.  I was present for greater than 50% of today's visit.  I performed medical decision making.  Julieanne Manson, MD

## 2022-06-23 NOTE — Telephone Encounter (Signed)
The patient has been notified of the result and verbalized understanding.  All questions (if any) were answered.  Pt aware we will order for him to get a repeat echo in one year for survelliance.   Pt aware I will go ahead and order for this for one year out, and send a message to our Echo Scheduler to call him back to arrange this appt.   Pt verbalized understanding and agrees with this plan.

## 2022-06-25 ENCOUNTER — Other Ambulatory Visit: Payer: Self-pay

## 2022-06-25 ENCOUNTER — Other Ambulatory Visit: Payer: Self-pay | Admitting: Oncology

## 2022-06-28 ENCOUNTER — Other Ambulatory Visit: Payer: Self-pay

## 2022-06-28 ENCOUNTER — Inpatient Hospital Stay: Payer: Medicare HMO | Admitting: Nurse Practitioner

## 2022-06-28 ENCOUNTER — Encounter: Payer: Self-pay | Admitting: Nurse Practitioner

## 2022-06-28 ENCOUNTER — Inpatient Hospital Stay: Payer: Medicare HMO

## 2022-06-28 ENCOUNTER — Inpatient Hospital Stay: Payer: Medicare HMO | Admitting: Nutrition

## 2022-06-28 VITALS — BP 114/70 | HR 98 | Temp 98.2°F | Resp 18 | Ht 72.0 in | Wt 253.0 lb

## 2022-06-28 DIAGNOSIS — C168 Malignant neoplasm of overlapping sites of stomach: Secondary | ICD-10-CM | POA: Diagnosis not present

## 2022-06-28 DIAGNOSIS — Z5111 Encounter for antineoplastic chemotherapy: Secondary | ICD-10-CM | POA: Diagnosis not present

## 2022-06-28 DIAGNOSIS — Z95828 Presence of other vascular implants and grafts: Secondary | ICD-10-CM

## 2022-06-28 LAB — CMP (CANCER CENTER ONLY)
ALT: 13 U/L (ref 0–44)
AST: 18 U/L (ref 15–41)
Albumin: 3.7 g/dL (ref 3.5–5.0)
Alkaline Phosphatase: 81 U/L (ref 38–126)
Anion gap: 11 (ref 5–15)
BUN: 19 mg/dL (ref 8–23)
CO2: 25 mmol/L (ref 22–32)
Calcium: 9.4 mg/dL (ref 8.9–10.3)
Chloride: 103 mmol/L (ref 98–111)
Creatinine: 1 mg/dL (ref 0.61–1.24)
GFR, Estimated: >60 mL/min (ref >60)
Glucose, Bld: 110 mg/dL — ABNORMAL HIGH (ref 70–99)
Potassium: 3.4 mmol/L — ABNORMAL LOW (ref 3.5–5.1)
Sodium: 139 mmol/L (ref 135–145)
Total Bilirubin: 0.5 mg/dL (ref 0.3–1.2)
Total Protein: 6.7 g/dL (ref 6.5–8.1)

## 2022-06-28 LAB — CBC WITH DIFFERENTIAL (CANCER CENTER ONLY)
Abs Immature Granulocytes: 0.08 10*3/uL — ABNORMAL HIGH (ref 0.00–0.07)
Basophils Absolute: 0.1 10*3/uL (ref 0.0–0.1)
Basophils Relative: 1 %
Eosinophils Absolute: 0 10*3/uL (ref 0.0–0.5)
Eosinophils Relative: 0 %
HCT: 35.9 % — ABNORMAL LOW (ref 39.0–52.0)
Hemoglobin: 11.5 g/dL — ABNORMAL LOW (ref 13.0–17.0)
Immature Granulocytes: 1 %
Lymphocytes Relative: 19 %
Lymphs Abs: 2.1 10*3/uL (ref 0.7–4.0)
MCH: 27.8 pg (ref 26.0–34.0)
MCHC: 32 g/dL (ref 30.0–36.0)
MCV: 86.7 fL (ref 80.0–100.0)
Monocytes Absolute: 1.1 10*3/uL — ABNORMAL HIGH (ref 0.1–1.0)
Monocytes Relative: 10 %
Neutro Abs: 8 10*3/uL — ABNORMAL HIGH (ref 1.7–7.7)
Neutrophils Relative %: 69 %
Platelet Count: 192 10*3/uL (ref 150–400)
RBC: 4.14 MIL/uL — ABNORMAL LOW (ref 4.22–5.81)
RDW: 18.3 % — ABNORMAL HIGH (ref 11.5–15.5)
WBC Count: 11.4 10*3/uL — ABNORMAL HIGH (ref 4.0–10.5)
nRBC: 0 % (ref 0.0–0.2)

## 2022-06-28 MED ORDER — FLUOROURACIL CHEMO INJECTION 2.5 GM/50ML
400.0000 mg/m2 | Freq: Once | INTRAVENOUS | Status: AC
  Administered 2022-06-28: 1000 mg via INTRAVENOUS
  Filled 2022-06-28: qty 20

## 2022-06-28 MED ORDER — OXALIPLATIN CHEMO INJECTION 100 MG/20ML
65.0000 mg/m2 | Freq: Once | INTRAVENOUS | Status: AC
  Administered 2022-06-28: 150 mg via INTRAVENOUS
  Filled 2022-06-28: qty 20

## 2022-06-28 MED ORDER — PALONOSETRON HCL INJECTION 0.25 MG/5ML
0.2500 mg | Freq: Once | INTRAVENOUS | Status: AC
  Administered 2022-06-28: 0.25 mg via INTRAVENOUS
  Filled 2022-06-28: qty 5

## 2022-06-28 MED ORDER — SODIUM CHLORIDE 0.9% FLUSH
10.0000 mL | INTRAVENOUS | Status: DC | PRN
  Administered 2022-06-28: 10 mL via INTRAVENOUS

## 2022-06-28 MED ORDER — LEUCOVORIN CALCIUM INJECTION 350 MG
400.0000 mg/m2 | Freq: Once | INTRAVENOUS | Status: AC
  Administered 2022-06-28: 992 mg via INTRAVENOUS
  Filled 2022-06-28: qty 49.6

## 2022-06-28 MED ORDER — DEXTROSE 5 % IV SOLN: Freq: Once | INTRAVENOUS | Status: AC

## 2022-06-28 MED ORDER — SODIUM CHLORIDE 0.9 % IV SOLN
10.0000 mg | Freq: Once | INTRAVENOUS | Status: AC
  Administered 2022-06-28: 10 mg via INTRAVENOUS
  Filled 2022-06-28: qty 1

## 2022-06-28 MED ORDER — SODIUM CHLORIDE 0.9 % IV SOLN
2000.0000 mg/m2 | INTRAVENOUS | Status: DC
  Administered 2022-06-28: 5000 mg via INTRAVENOUS
  Filled 2022-06-28: qty 100

## 2022-06-28 NOTE — Progress Notes (Signed)
Nutrition follow up completed with patient and wife.  Patient receiving cycle 5 FOLFOX for Gastric cancer. He is followed by Dr. Benay Spice.  Weight decreased and documented as 253 pounds Feb 21, decreased from 283 pounds on Dec 19. This is an 11% wt loss in less than 3 months.  Labs: Potassium 3.4 and Glucose 110  Medications include Klor-Con. Recently steroids added for cough.  Patient reports his appetite is not great. He has difficulty thinking of foods to eat. He really wants a peanut butter and jelly sandwich today. States some foods taste bland or have no taste. He doesn't always think about eating. He was drinking one carton of Ensure Complete daily however, now more sporadically. States he has difficulty finding Ensure Complete.  Nutrition Diagnosis: Unintended wt loss, ongoing.  11% wt loss in less than 3 months. Includes some fluid shifts. Patient has decreased edema.  Intervention: Reviewed tips on taste alterations and encouraged baking soda and salt water gargles frequently throughout the day and before eating. Provided fact sheet. Reviewed ways to increase appetite. Discussed importance of eating despite decreased appetite. Suggested ONS between meals. Provided poor appetite handout. Provided one complimentary case of Ensure Plus High Protein, Chocolate. Discussed appetite stimulants, pros and cons. Encouraged him to discuss with MD after completion of steroid dose pack.  Monitoring, Evaluation, Goals: Increase calories and protein to minimize wt loss.  Next Visit: Wednesday, March 6, during infusion.

## 2022-06-28 NOTE — Patient Instructions (Addendum)
[facility]  Discharge Instructions: Thank you for choosing [facility] to provide your oncology and hematology care.   If you have a lab appointment with the [facility], please go directly to the [facility] and check in at the registration area.   Wear comfortable clothing and clothing appropriate for easy access to any Portacath or PICC line.   We strive to give you quality time with your provider. You may need to reschedule your appointment if you arrive late (15 or more minutes).  Arriving late affects you and other patients whose appointments are after yours.  Also, if you miss three or more appointments without notifying the office, you may be dismissed from the clinic at the provider's discretion.      For prescription refill requests, have your pharmacy contact our office and allow 72 hours for refills to be completed.    Today you received the following chemotherapy and/or immunotherapy agents Oxaliplatin, Leucovorin and Adrucil       To help prevent nausea and vomiting after your treatment, we encourage you to take your nausea medication as directed.  BELOW ARE SYMPTOMS THAT SHOULD BE REPORTED IMMEDIATELY: *FEVER GREATER THAN 100.4 F (38 C) OR HIGHER *CHILLS OR SWEATING *NAUSEA AND VOMITING THAT IS NOT CONTROLLED WITH YOUR NAUSEA MEDICATION *UNUSUAL SHORTNESS OF BREATH *UNUSUAL BRUISING OR BLEEDING *URINARY PROBLEMS (pain or burning when urinating, or frequent urination) *BOWEL PROBLEMS (unusual diarrhea, constipation, pain near the anus) TENDERNESS IN MOUTH AND THROAT WITH OR WITHOUT PRESENCE OF ULCERS (sore throat, sores in mouth, or a toothache) UNUSUAL RASH, SWELLING OR PAIN  UNUSUAL VAGINAL DISCHARGE OR ITCHING   Items with * indicate a potential emergency and should be followed up as soon as possible or go to the Emergency Department if any problems should occur.  Please show the CHEMOTHERAPY ALERT CARD or  IMMUNOTHERAPY ALERT CARD at check-in to the Emergency Department and triage nurse.  Should you have questions after your visit or need to cancel or reschedule your appointment, please contact [facility]  Dept: [phone]  and follow the prompts.  Office hours are 8:00 a.m. to 4:30 p.m. Monday - Friday. Please note that voicemails left after 4:00 p.m. may not be returned until the following business day.  We are closed weekends and major holidays. You have access to a nurse at all times for urgent questions. Please call the main number to the clinic Dept: [phone] and follow the prompts.   For any non-urgent questions, you may also contact your provider using MyChart. We now offer e-Visits for anyone 51 and older to request care online for non-urgent symptoms. For details visit [email or web address].   Also download the MyChart app! Go to the app store, search "MyChart", open the app, select [facility], and log in with your MyChart username and password.

## 2022-06-28 NOTE — Progress Notes (Signed)
Patient seen by Lisa Thomas NP today  Vitals are within treatment parameters.  Labs reviewed by Lisa Thomas NP and are within treatment parameters.  Per physician team, patient is ready for treatment and there are NO modifications to the treatment plan.     

## 2022-06-28 NOTE — Progress Notes (Signed)
  [facility] OFFICE PROGRESS NOTE   Diagnosis: Gastric cancer  INTERVAL HISTORY:   Mr. Barbie returns as scheduled.  He completed cycle 4 FOLFOX 06/14/2022.  He was seen in an unscheduled visit on 06/23/2022 for evaluation of a cough and wheezing.  We treated him for a COPD exacerbation.  He denies nausea/vomiting.  No mouth sores.  No diarrhea.  Cold sensitivity lasted about a week.  No persistent neuropathy symptoms.  Respiratory symptoms are better.  Dyspnea is at baseline.  No fever.  No chest pain.  Objective:  Vital signs in last 24 hours:  Blood pressure 114/70, pulse 98, temperature 98.2 F (36.8 C), temperature source Oral, resp. rate 18, height 6' (1.829 m), weight 253 lb (114.8 kg), SpO2 97 %.    HEENT: No thrush or ulcers.  Mucous membranes appear moist. Resp: Scattered expiratory rhonchi.  No respiratory distress. Cardio: Regular, occasional premature beat. GI: Abdomen is soft, nontender.  No hepatosplenomegaly.  Mild abdominal distention. Vascular: No leg edema.  Right lower leg is slightly larger than the left lower leg. Skin: Palms without erythema. Port-A-Cath without erythema.  Lab Results:  Lab Results  Component Value Date   WBC 11.4 (H) 06/28/2022   HGB 11.5 (L) 06/28/2022   HCT 35.9 (L) 06/28/2022   MCV 86.7 06/28/2022   PLT 192 06/28/2022   NEUTROABS 8.0 (H) 06/28/2022    Imaging:  No results found.  Medications: I have reviewed the patient's current medications.  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  Internal hemorrhoids.  No specimens collected. Upper endoscopy 04/25/2022-large infiltrative mass with oozing bleeding and stigmata of recent bleeding in the gastric fundus, on the anterior wall of the gastric body and on the greater curvature of the gastric body; deformity in the gastric antrum.  Extrinsic deformity in the entire duodenum.  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 Her2 by IHC negative, 1+; MMR IHC normal; PD-L1 CPS 1%, positive.  Cycle 1 FOLFOX 05/04/2022 Cycle 2 FOLFOX 05/17/2022 Cycle 3 FOLFOX 05/31/2022-oxaliplatin dose reduced secondary to prolonged cold sensitivity Cycle 4 FOLFOX 06/14/2022 Cycle 5 FOLFOX 06/28/2022 Truncal rash-status post recent punch biopsy with pathology pending; biopsy reported negative per family [id number] Lichen planus of the feet Hypothyroidism Atrial fibrillation  Disposition: Mr. Pawlik appears stable.  He has completed 4 cycles of FOLFOX.  Plan to proceed with cycle 5 today as scheduled.  Restaging CTs prior to next office visit.  CBC and chemistry panel reviewed.  Labs adequate to proceed as above.  Initial vital signs showed hypotension and tachycardia.  Manual repeat vitals were in normal range, at his baseline.  We obtained an EKG which shows normal sinus rhythm.  He will return for lab, follow-up, chemotherapy in 2 weeks with restaging CT scans a few days prior.  He will contact the office in the interim with any problems.    Ned Card ANP/GNP-BC   06/28/2022  10:06 AM

## 2022-06-28 NOTE — Patient Instructions (Signed)

## 2022-06-30 ENCOUNTER — Inpatient Hospital Stay: Payer: Medicare HMO

## 2022-06-30 VITALS — BP 93/63 | HR 77 | Temp 98.0°F | Resp 18

## 2022-06-30 DIAGNOSIS — Z5111 Encounter for antineoplastic chemotherapy: Secondary | ICD-10-CM | POA: Diagnosis not present

## 2022-06-30 DIAGNOSIS — C168 Malignant neoplasm of overlapping sites of stomach: Secondary | ICD-10-CM

## 2022-06-30 MED ORDER — SODIUM CHLORIDE 0.9% FLUSH
10.0000 mL | INTRAVENOUS | Status: DC | PRN
  Administered 2022-06-30: 10 mL

## 2022-06-30 MED ORDER — HEPARIN SOD (PORK) LOCK FLUSH 100 UNIT/ML IV SOLN
500.0000 [IU] | Freq: Once | INTRAVENOUS | Status: AC | PRN
  Administered 2022-06-30: 500 [IU]

## 2022-06-30 NOTE — Patient Instructions (Signed)

## 2022-07-03 ENCOUNTER — Encounter: Payer: Self-pay | Admitting: Pulmonary Disease

## 2022-07-03 ENCOUNTER — Ambulatory Visit: Payer: Medicare HMO | Admitting: Pulmonary Disease

## 2022-07-03 VITALS — BP 110/70 | HR 84 | Ht 72.0 in | Wt 257.0 lb

## 2022-07-03 DIAGNOSIS — C168 Malignant neoplasm of overlapping sites of stomach: Secondary | ICD-10-CM

## 2022-07-03 DIAGNOSIS — R0989 Other specified symptoms and signs involving the circulatory and respiratory systems: Secondary | ICD-10-CM

## 2022-07-03 DIAGNOSIS — R053 Chronic cough: Secondary | ICD-10-CM | POA: Diagnosis not present

## 2022-07-03 DIAGNOSIS — J4530 Mild persistent asthma, uncomplicated: Secondary | ICD-10-CM

## 2022-07-03 MED ORDER — FLUTICASONE FUROATE-VILANTEROL 100-25 MCG/ACT IN AEPB: 1.0000 | INHALATION_SPRAY | Freq: Every day | RESPIRATORY_TRACT | 6 refills | Status: DC

## 2022-07-03 NOTE — Patient Instructions (Signed)
Thank you for visiting Dr. Valeta Harms at [facility]. Today we recommend the following:  Meds ordered this encounter  Medications   fluticasone furoate-vilanterol (BREO ELLIPTA) 100-25 MCG/ACT AEPB    Sig: Inhale 1 puff into the lungs daily.    Dispense:  60 each    Refill:  6   Return if symptoms worsen or fail to improve.    Please do your part to reduce the spread of COVID-19.

## 2022-07-03 NOTE — Progress Notes (Signed)
Synopsis: Referred in Dec 2020 for sinus infections by Todd Dy, MD  Subjective:   PATIENT ID: Todd Harrison GENDER: male DOB: 1946/01/06, MRN: [id number]  Chief Complaint  Patient presents with   Follow-up    F/up    PMH of recurrent sinus infectious and SOB/Cough. Todd Harrison was was sick at this time. Symptoms started 6 weeks ago. Usually if his sinuses are inflammated he has tooth pain. He didn't have it this time. He has been coughing now for >6 weeks. 4 weeks ago now he was up at night time with cough. His cough is 99% better since last week. Family members were swabbed for covid and were negative. He has not been tested for covid. Former smoker quit 1982, quit in 10 years, but smoked 4-5 ppd at the time.  At this time his cough is much better.  He has no chest pain.  Patient denies hemoptysis or dark sputum production.  He states that once he had this appointment made his symptoms started to improve slowly.  He has not ever had any pulmonary function test in the past.  He does have some sleep troubles.  He wakes up with a hoarse voice.  He gets up several times in the middle of the night to pee.  He does snore per his wife.  He has never had a sleep study.  Occupation: Patient is a Engineer, technical sales, upright base, electric bass and trombone.  OV 09/16/2020: Patient last seen by me in 2020.  Here today for follow-up.  Has subsequently been seen by Todd Harrison.  Last seen in the office in January 2022.  Not taking any of his inhalers at this time.  He said he felt like he was doing better so he stopped taking his inhaler medications.  He is still taking his immunosuppression on occasion not taking it regularly for the lichen planus.  He is taking his Accutane regularly.  He did have a home sleep study that was completed that showed severe OSA but he does not wear this routinely.  He tries to wear it is much as he can at nighttime but tends to pull it off when he is asleep and he finds it  in the floor or disconnected when he wakes up.  His reflux is also not controlled but he does try to titrate medications to help with his reflux.  OV 06/17/2021: Here today for follow up after pfts. Review pfts results with patient. Cough is better. No recent exacerbations.  From a respiratory standpoint doing well.  Has no significant complaints today.  He does have issues ongoing with his medications for lichen planus.  His lower extremities are a little bit more swollen today than usual he says.  Lichen planus on his feet make mobility much more difficult as he gets older.  OV 07/03/2022: Here today for follow-up.  His cough symptoms have really improved over the past year however since November he was diagnosed with stage IV stomach cancer and has been undergoing treatments with medical oncology.  He has noticed over the past couple weeks he has had increased cough and sputum production.  Is been using his albuterol inhaler more.  He thinks that this is related to his swallowing ability.  His swallowing ability was evaluated in 2021 he was told he had aspiration risk.  He has noticed increased situations where he has cough after eating.  And has even expelled food before or after eating.    Past Medical  History:  Diagnosis Date   A-fib (HCC)    Asthma    Cataract    GERD (gastroesophageal reflux disease)    Lichen planus    Methotrexate, long term, current use    no longer taking   Psoriasis    Sleep apnea    Thyroid disease    Urticaria      Family History  Problem Relation Age of Onset   Diabetes Mother    Stroke Mother    Leukemia Maternal Great-grandmother    Allergic rhinitis Neg Hx    Asthma Neg Hx    Eczema Neg Hx    Urticaria Neg Hx    Colon cancer Neg Hx    Esophageal cancer Neg Hx    Stomach cancer Neg Hx      Past Surgical History:  Procedure Laterality Date   CARDIOVERSION N/A 02/23/2020   Procedure: CARDIOVERSION;  Surgeon: Todd Lean, MD;   Location: [facility] ENDOSCOPY;  Service: Cardiovascular;  Laterality: N/A;   CARDIOVERSION N/A 04/12/2020   Procedure: CARDIOVERSION;  Surgeon: Todd Dresser, MD;  Location: [facility] ENDOSCOPY;  Service: Cardiovascular;  Laterality: N/A;   CATARACT EXTRACTION Left    2019   IR IMAGING GUIDED PORT INSERTION  05/03/2022   TONSILLECTOMY      Social History   Socioeconomic History   Marital status: Married    Spouse name: Not on file   Number of children: Not on file   Years of education: Not on file   Highest education level: Not on file  Occupational History   Not on file  Tobacco Use   Smoking status: Former    Types: Cigarettes    Quit date: 1982    Years since quitting: 42.1   Smokeless tobacco: Never  Vaping Use   Vaping Use: Not on file  Substance and Sexual Activity   Alcohol use: Yes    Alcohol/week: 1.0 standard drink of alcohol    Types: 1 Glasses of wine per week    Comment: occasionally   Drug use: No   Sexual activity: Not on file  Other Topics Concern   Not on file  Social History Narrative   Married with 1 son and 1 daughter   Former smoker no alcohol no caffeine no tobacco or drug use   Social Determinants of Health   Financial Resource Strain: Low Risk  (04/17/2022)   Overall Financial Resource Strain (CARDIA)    Difficulty of Paying Living Expenses: Not hard at all  Food Insecurity: No Food Insecurity (04/17/2022)   Hunger Vital Sign    Worried About Running Out of Food in the Last Year: Never true    [place] in the Last Year: Never true  Transportation Needs: No Transportation Needs (04/17/2022)   PRAPARE - Hydrologist (Medical): No    Lack of Transportation (Non-Medical): No  Physical Activity: Not on file  Stress: No Stress Concern Present (04/17/2022)   [place]    Feeling of Stress : Not at all  Social Connections: Not on file  Intimate  Partner Violence: Not At Risk (04/17/2022)   Humiliation, Afraid, Rape, and Kick questionnaire    Fear of Current or Ex-Partner: No    Emotionally Abused: No    Physically Abused: No    Sexually Abused: No     Allergies  Allergen Reactions   Gold Itching   Mixed Grasses  unknown     Outpatient Medications Prior to Visit  Medication Sig Dispense Refill   acetaminophen (TYLENOL) 500 MG tablet Take 500 mg by mouth every 6 (six) hours as needed for moderate pain.     albuterol (VENTOLIN HFA) 108 (90 Base) MCG/ACT inhaler Inhale 2 puffs into the lungs every 6 (six) hours as needed for wheezing or shortness of breath. 8 g 1   clobetasol ointment (TEMOVATE) AB-123456789 % Apply 1 Application topically 2 (two) times daily as needed (rash).     cyanocobalamin (VITAMIN B12) 100 MCG tablet Take 100-200 mcg by mouth daily.     fluticasone (FLONASE) 50 MCG/ACT nasal spray Place 1 spray into both nostrils daily as needed for allergies or rhinitis.     ISOtretinoin (ACCUTANE) 40 MG capsule Take 40 mg by mouth 2 (two) times daily.      KLOR-CON M20 20 MEQ tablet TAKE 1 TABLET BY MOUTH EVERY DAY 90 tablet 1   levothyroxine (SYNTHROID) 112 MCG tablet Take 112 mcg by mouth daily before breakfast.     lidocaine-prilocaine (EMLA) cream Apply 1 Application topically as needed (Apply 1 hour before coming to [place] for accessing). 30 g 2   methylPREDNISolone (MEDROL DOSEPAK) 4 MG TBPK tablet Take as directed 21 tablet 0   metoprolol succinate (TOPROL-XL) 50 MG 24 hr tablet TAKE 1 TABLET BY MOUTH EVERY DAY 90 tablet 3   metoprolol tartrate (LOPRESSOR) 25 MG tablet Take 1 tablet (25 mg total) by mouth 2 (two) times daily as needed (palpitations). 30 tablet 4   omeprazole (PRILOSEC) 40 MG capsule Take 40 mg by mouth daily.     ondansetron (ZOFRAN) 8 MG tablet Take 1 tablet (8 mg total) by mouth every 8 (eight) hours as needed for nausea or vomiting. 30 tablet 1   polyethylene glycol (MIRALAX / GLYCOLAX) 17 g  packet Take 17 g by mouth daily.     predniSONE (DELTASONE) 20 MG tablet Take 40 mg daily for 3 days, then 20 mg daily for 3 days, then 10 mg daily if rash recurs 40 tablet 0   prochlorperazine (COMPAZINE) 10 MG tablet Take 1 tablet (10 mg total) by mouth every 6 (six) hours as needed for nausea or vomiting. 30 tablet 1   Pyridoxine HCl (VITAMIN B6 PO) Take 1 tablet by mouth daily.     traMADol (ULTRAM) 50 MG tablet Take 1 tablet (50 mg total) by mouth every 6 (six) hours as needed. 30 tablet 0   triamcinolone (KENALOG) 0.1 % Apply 1 application  topically daily as needed (lichen planus flare).     azithromycin (ZITHROMAX Z-PAK) 250 MG tablet Take as directed (Patient not taking: Reported on 07/03/2022) 6 each 0   hydroxychloroquine (PLAQUENIL) 200 MG tablet Take 200 mg by mouth 2 (two) times daily. (Patient not taking: Reported on 07/03/2022)     hydrOXYzine (ATARAX) 10 MG tablet Take 1 tablet (10 mg total) by mouth 3 (three) times daily as needed. (Patient not taking: Reported on 05/31/2022) 30 tablet 0   No facility-administered medications prior to visit.    Review of Systems  Constitutional:  Positive for malaise/fatigue. Negative for chills, fever and weight loss.  HENT:  Negative for hearing loss, sore throat and tinnitus.   Eyes:  Negative for blurred vision and double vision.  Respiratory:  Positive for cough and shortness of breath. Negative for hemoptysis, sputum production, wheezing and stridor.   Cardiovascular:  Negative for chest pain, palpitations, orthopnea, leg swelling and PND.  Gastrointestinal:  Positive for nausea. Negative for abdominal pain, constipation, diarrhea, heartburn and vomiting.  Genitourinary:  Negative for dysuria, hematuria and urgency.  Musculoskeletal:  Negative for joint pain and myalgias.  Skin:  Negative for itching and rash.  Neurological:  Negative for dizziness, tingling, weakness and headaches.  Endo/Heme/Allergies:  Negative for environmental  allergies. Does not bruise/bleed easily.  Psychiatric/Behavioral:  Negative for depression. The patient is not nervous/anxious and does not have insomnia.   All other systems reviewed and are negative.    Objective:  Physical Exam Vitals reviewed.  Constitutional:      General: He is not in acute distress.    Appearance: He is well-developed. He is obese.  HENT:     Head: Normocephalic and atraumatic.  Eyes:     General: No scleral icterus.    Conjunctiva/sclera: Conjunctivae normal.     Pupils: Pupils are equal, round, and reactive to light.  Neck:     Vascular: No JVD.     Trachea: No tracheal deviation.  Cardiovascular:     Rate and Rhythm: Normal rate and regular rhythm.     Heart sounds: Normal heart sounds. No murmur heard. Pulmonary:     Effort: Pulmonary effort is normal. No tachypnea, accessory muscle usage or respiratory distress.     Breath sounds: No stridor. No wheezing, rhonchi or rales.  Abdominal:     General: There is no distension.     Palpations: Abdomen is soft.     Tenderness: There is no abdominal tenderness.  Musculoskeletal:        General: No tenderness.     Cervical back: Neck supple.  Lymphadenopathy:     Cervical: No cervical adenopathy.  Skin:    General: Skin is warm and dry.     Capillary Refill: Capillary refill takes less than 2 seconds.     Findings: No rash.  Neurological:     Mental Status: He is alert and oriented to person, place, and time.  Psychiatric:        Behavior: Behavior normal.      Vitals:   07/03/22 1531  BP: 110/70  Pulse: 84  SpO2: 97%  Weight: 257 lb (116.6 kg)  Height: 6' (1.829 m)   97% on RA BMI Readings from Last 3 Encounters:  07/03/22 34.86 kg/m  06/28/22 34.31 kg/m  06/23/22 35.26 kg/m   Wt Readings from Last 3 Encounters:  07/03/22 257 lb (116.6 kg)  06/28/22 253 lb (114.8 kg)  06/23/22 260 lb (117.9 kg)     CBC    Component Value Date/Time   WBC 11.4 (H) 06/28/2022 0850   WBC 13.7  (H) 04/24/2022 0915   RBC 4.14 (L) 06/28/2022 0850   HGB 11.5 (L) 06/28/2022 0850   HGB 12.6 (L) 02/20/2020 0858   HCT 35.9 (L) 06/28/2022 0850   HCT 36.6 (L) 02/20/2020 0858   PLT 192 06/28/2022 0850   PLT 279 02/20/2020 0858   MCV 86.7 06/28/2022 0850   MCV 86 02/20/2020 0858   MCH 27.8 06/28/2022 0850   MCHC 32.0 06/28/2022 0850   RDW 18.3 (H) 06/28/2022 0850   RDW 14.1 02/20/2020 0858   LYMPHSABS 2.1 06/28/2022 0850   LYMPHSABS 1.4 01/01/2020 1359   MONOABS 1.1 (H) 06/28/2022 0850   EOSABS 0.0 06/28/2022 0850   EOSABS 0.1 01/01/2020 1359   BASOSABS 0.1 06/28/2022 0850   BASOSABS 0.1 01/01/2020 1359    Chest Imaging: CXR - 2018 - normal   CT Chest 08/11/2020:  CT imaging was complete in April this revealed basilar subpleural reticulations and groundglass concern for possible ILD.  He also has some small airway disease.  I think after reviewing the information and his clinical picture he appears to be volume overloaded and this areas of groundglass and subpleural involvement likely are related to his positive fluid balance and possibly pulmonary edema. The patient's images have been independently reviewed by me.     Pulmonary Functions Testing Results:    Latest Ref Rng & Units 06/17/2021   11:59 AM 06/12/2019    2:52 PM  PFT Results  FVC-Pre L 4.34  4.00   FVC-Predicted Pre % 93  84   FVC-Post L 4.39  4.07   FVC-Predicted Post % 94  86   Pre FEV1/FVC % % 75  76   Post FEV1/FCV % % 78  73   FEV1-Pre L 3.28  3.04   FEV1-Predicted Pre % 96  87   FEV1-Post L 3.41  2.98   DLCO uncorrected ml/min/mmHg 19.79  24.05   DLCO UNC% % 73  87   DLCO corrected ml/min/mmHg 19.79    DLCO COR %Predicted % 73    DLVA Predicted % 76  94   TLC L 7.09  6.46   TLC % Predicted % 95  86   RV % Predicted % 99  87     FeNO: None   Pathology: None   Echocardiogram:   IMPRESSIONS   1. Left ventricular ejection fraction, by estimation, is 55 to 60%. The  left ventricle has normal  function. The left ventricle has no regional  wall motion abnormalities. There is mild concentric left ventricular  hypertrophy. Left ventricular diastolic  function could not be evaluated.   2. Right ventricular systolic function is normal. The right ventricular  size is normal. Tricuspid regurgitation signal is inadequate for assessing  PA pressure.   3. The mitral valve is grossly normal. Mild to moderate mitral valve  regurgitation. No evidence of mitral stenosis.   4. The aortic valve is tricuspid. There is moderate calcification of the  aortic valve. There is moderate thickening of the aortic valve. Aortic  valve regurgitation is not visualized. Mild to moderate aortic valve  sclerosis/calcification is present,  without any evidence of aortic stenosis.   5. Aortic dilatation noted. There is mild dilatation of the aortic root,  measuring 42 mm.   6. The inferior vena cava is dilated in size with >50% respiratory  variability, suggesting right atrial pressure of 8 mmHg.   Heart Catheterization: None      Assessment & Plan:     ICD-10-CM   1. Mild persistent asthmatic bronchitis without complication  A999333     2. Chest congestion  R09.89     3. Chronic cough  R05.3     4. Malignant neoplasm of overlapping sites of stomach ([place])  C16.8       Discussion:  This is a 77 year old gentleman seen initially for evaluation of cough.  He had mild persistent asthmatic bronchitis symptoms in the past was using as needed albuterol.  He has had evaluations with swallow studies in the past.  Was seen by SLP in 2021.  At that time was told had risk for aspiration.  Even now has experience with cough after swallowing and eating.  He has had pulmonary function test in the past with a reduced ERV likely related to body habitus normal ratio, normal spirometry normal TLC and DLCO that 73% predicted.  Plan: Working to start him on Breo 100 to see if this helps with some of his asthma  symptoms. If the cough is related to recurrent aspiration into the airway I reviewed the SLP recommendations from 2021 with him. He is currently undergoing treatments for his gastric cancer. He is on a follow-up with [place] as needed in the future. Continue an inhaler regimen for the time being.  He is and let us know if this does not help. If his aspiration symptoms get worse I explained that we could talk about further options but he is not interested in ever having tube feeding.  And would always be interested in oral feeds.    Current Outpatient Medications:    acetaminophen (TYLENOL) 500 MG tablet, Take 500 mg by mouth every 6 (six) hours as needed for moderate pain., Disp: , Rfl:    albuterol (VENTOLIN HFA) 108 (90 Base) MCG/ACT inhaler, Inhale 2 puffs into the lungs every 6 (six) hours as needed for wheezing or shortness of breath., Disp: 8 g, Rfl: 1   clobetasol ointment (TEMOVATE) AB-123456789 %, Apply 1 Application topically 2 (two) times daily as needed (rash)., Disp: , Rfl:    cyanocobalamin (VITAMIN B12) 100 MCG tablet, Take 100-200 mcg by mouth daily., Disp: , Rfl:    fluticasone (FLONASE) 50 MCG/ACT nasal spray, Place 1 spray into both nostrils daily as needed for allergies or rhinitis., Disp: , Rfl:    fluticasone furoate-vilanterol (BREO ELLIPTA) 100-25 MCG/ACT AEPB, Inhale 1 puff into the lungs daily., Disp: 60 each, Rfl: 6   ISOtretinoin (ACCUTANE) 40 MG capsule, Take 40 mg by mouth 2 (two) times daily. , Disp: , Rfl:    KLOR-CON M20 20 MEQ tablet, TAKE 1 TABLET BY MOUTH EVERY DAY, Disp: 90 tablet, Rfl: 1   levothyroxine (SYNTHROID) 112 MCG tablet, Take 112 mcg by mouth daily before breakfast., Disp: , Rfl:    lidocaine-prilocaine (EMLA) cream, Apply 1 Application topically as needed (Apply 1 hour before coming to [place] for accessing)., Disp: 30 g, Rfl: 2   methylPREDNISolone (MEDROL DOSEPAK) 4 MG TBPK tablet, Take as directed, Disp: 21 tablet, Rfl: 0   metoprolol succinate  (TOPROL-XL) 50 MG 24 hr tablet, TAKE 1 TABLET BY MOUTH EVERY DAY, Disp: 90 tablet, Rfl: 3   metoprolol tartrate (LOPRESSOR) 25 MG tablet, Take 1 tablet (25 mg total) by mouth 2 (two) times daily as needed (palpitations)., Disp: 30 tablet, Rfl: 4   omeprazole (PRILOSEC) 40 MG capsule, Take 40 mg by mouth daily., Disp: , Rfl:    ondansetron (ZOFRAN) 8 MG tablet, Take 1 tablet (8 mg total) by mouth every 8 (eight) hours as needed for nausea or vomiting., Disp: 30 tablet, Rfl: 1   polyethylene glycol (MIRALAX / GLYCOLAX) 17 g packet, Take 17 g by mouth daily., Disp: , Rfl:    predniSONE (DELTASONE) 20 MG tablet, Take 40 mg daily for 3 days, then 20 mg daily for 3 days, then 10 mg daily if rash recurs, Disp: 40 tablet, Rfl: 0   prochlorperazine (COMPAZINE) 10 MG tablet, Take 1 tablet (10 mg total) by mouth every 6 (six) hours as needed for nausea or vomiting., Disp: 30 tablet, Rfl: 1   Pyridoxine HCl (VITAMIN B6 PO), Take 1 tablet by mouth daily., Disp: , Rfl:    traMADol (ULTRAM) 50 MG tablet, Take 1 tablet (50 mg total) by mouth every 6 (six) hours as needed., Disp: 30 tablet, Rfl: 0   triamcinolone (KENALOG) 0.1 %, Apply 1  application  topically daily as needed (lichen planus flare)., Disp: , Rfl:    azithromycin (ZITHROMAX Z-PAK) 250 MG tablet, Take as directed (Patient not taking: Reported on 07/03/2022), Disp: 6 each, Rfl: 0   hydroxychloroquine (PLAQUENIL) 200 MG tablet, Take 200 mg by mouth 2 (two) times daily. (Patient not taking: Reported on 07/03/2022), Disp: , Rfl:    hydrOXYzine (ATARAX) 10 MG tablet, Take 1 tablet (10 mg total) by mouth 3 (three) times daily as needed. (Patient not taking: Reported on 05/31/2022), Disp: 30 tablet, Rfl: 0   Garner Nash, DO [facility] 07/03/2022 3:56 PM

## 2022-07-05 ENCOUNTER — Encounter: Payer: Self-pay | Admitting: Nurse Practitioner

## 2022-07-08 ENCOUNTER — Ambulatory Visit (HOSPITAL_BASED_OUTPATIENT_CLINIC_OR_DEPARTMENT_OTHER)
Admission: RE | Admit: 2022-07-08 | Discharge: 2022-07-08 | Disposition: A | Discharge status: Home / Self Care | Payer: Medicare HMO | Source: Ambulatory Visit | Attending: Nurse Practitioner | Admitting: Nurse Practitioner

## 2022-07-08 ENCOUNTER — Other Ambulatory Visit: Payer: Self-pay | Admitting: Oncology

## 2022-07-08 DIAGNOSIS — C168 Malignant neoplasm of overlapping sites of stomach: Secondary | ICD-10-CM

## 2022-07-08 MED ORDER — IOHEXOL 300 MG/ML  SOLN
100.0000 mL | Freq: Once | INTRAMUSCULAR | Status: AC | PRN
  Administered 2022-07-08: 100 mL via INTRAVENOUS

## 2022-07-12 ENCOUNTER — Encounter: Payer: Self-pay | Admitting: *Deleted

## 2022-07-12 ENCOUNTER — Inpatient Hospital Stay: Payer: Medicare HMO | Admitting: Nutrition

## 2022-07-12 ENCOUNTER — Inpatient Hospital Stay: Payer: Medicare HMO | Attending: Nurse Practitioner

## 2022-07-12 ENCOUNTER — Inpatient Hospital Stay: Payer: Medicare HMO

## 2022-07-12 ENCOUNTER — Inpatient Hospital Stay: Payer: Medicare HMO | Admitting: Oncology

## 2022-07-12 VITALS — BP 109/72 | HR 84 | Temp 98.1°F | Resp 18 | Ht 72.0 in | Wt 256.0 lb

## 2022-07-12 DIAGNOSIS — C168 Malignant neoplasm of overlapping sites of stomach: Secondary | ICD-10-CM | POA: Diagnosis not present

## 2022-07-12 DIAGNOSIS — C786 Secondary malignant neoplasm of retroperitoneum and peritoneum: Secondary | ICD-10-CM | POA: Diagnosis not present

## 2022-07-12 DIAGNOSIS — I4891 Unspecified atrial fibrillation: Secondary | ICD-10-CM | POA: Insufficient documentation

## 2022-07-12 DIAGNOSIS — L439 Lichen planus, unspecified: Secondary | ICD-10-CM | POA: Diagnosis not present

## 2022-07-12 DIAGNOSIS — R21 Rash and other nonspecific skin eruption: Secondary | ICD-10-CM | POA: Diagnosis not present

## 2022-07-12 DIAGNOSIS — E039 Hypothyroidism, unspecified: Secondary | ICD-10-CM | POA: Diagnosis not present

## 2022-07-12 DIAGNOSIS — Z5111 Encounter for antineoplastic chemotherapy: Secondary | ICD-10-CM | POA: Diagnosis not present

## 2022-07-12 DIAGNOSIS — C169 Malignant neoplasm of stomach, unspecified: Secondary | ICD-10-CM | POA: Diagnosis not present

## 2022-07-12 LAB — CMP (CANCER CENTER ONLY)
ALT: 8 U/L (ref 0–44)
AST: 14 U/L — ABNORMAL LOW (ref 15–41)
Albumin: 3.4 g/dL — ABNORMAL LOW (ref 3.5–5.0)
Alkaline Phosphatase: 73 U/L (ref 38–126)
Anion gap: 9 (ref 5–15)
BUN: 19 mg/dL (ref 8–23)
CO2: 24 mmol/L (ref 22–32)
Calcium: 9 mg/dL (ref 8.9–10.3)
Chloride: 106 mmol/L (ref 98–111)
Creatinine: 1.13 mg/dL (ref 0.61–1.24)
GFR, Estimated: >60 mL/min (ref >60)
Glucose, Bld: 105 mg/dL — ABNORMAL HIGH (ref 70–99)
Potassium: 3.7 mmol/L (ref 3.5–5.1)
Sodium: 139 mmol/L (ref 135–145)
Total Bilirubin: 0.7 mg/dL (ref 0.3–1.2)
Total Protein: 6.3 g/dL — ABNORMAL LOW (ref 6.5–8.1)

## 2022-07-12 LAB — CBC WITH DIFFERENTIAL (CANCER CENTER ONLY)
Abs Immature Granulocytes: 0.02 10*3/uL (ref 0.00–0.07)
Basophils Absolute: 0.1 10*3/uL (ref 0.0–0.1)
Basophils Relative: 1 %
Eosinophils Absolute: 0.1 10*3/uL (ref 0.0–0.5)
Eosinophils Relative: 1 %
HCT: 32.6 % — ABNORMAL LOW (ref 39.0–52.0)
Hemoglobin: 10.5 g/dL — ABNORMAL LOW (ref 13.0–17.0)
Immature Granulocytes: 0 %
Lymphocytes Relative: 25 %
Lymphs Abs: 1.6 10*3/uL (ref 0.7–4.0)
MCH: 28.6 pg (ref 26.0–34.0)
MCHC: 32.2 g/dL (ref 30.0–36.0)
MCV: 88.8 fL (ref 80.0–100.0)
Monocytes Absolute: 0.7 10*3/uL (ref 0.1–1.0)
Monocytes Relative: 11 %
Neutro Abs: 3.9 10*3/uL (ref 1.7–7.7)
Neutrophils Relative %: 62 %
Platelet Count: 139 10*3/uL — ABNORMAL LOW (ref 150–400)
RBC: 3.67 MIL/uL — ABNORMAL LOW (ref 4.22–5.81)
RDW: 20.3 % — ABNORMAL HIGH (ref 11.5–15.5)
WBC Count: 6.3 10*3/uL (ref 4.0–10.5)
nRBC: 0 % (ref 0.0–0.2)

## 2022-07-12 MED ORDER — PALONOSETRON HCL INJECTION 0.25 MG/5ML
0.2500 mg | Freq: Once | INTRAVENOUS | Status: AC
  Administered 2022-07-12: 0.25 mg via INTRAVENOUS
  Filled 2022-07-12: qty 5

## 2022-07-12 MED ORDER — SODIUM CHLORIDE 0.9 % IV SOLN
10.0000 mg | Freq: Once | INTRAVENOUS | Status: AC
  Administered 2022-07-12: 10 mg via INTRAVENOUS
  Filled 2022-07-12: qty 1

## 2022-07-12 MED ORDER — DEXTROSE 5 % IV SOLN: Freq: Once | INTRAVENOUS | Status: AC

## 2022-07-12 MED ORDER — FLUOROURACIL CHEMO INJECTION 2.5 GM/50ML
400.0000 mg/m2 | Freq: Once | INTRAVENOUS | Status: AC
  Administered 2022-07-12: 1000 mg via INTRAVENOUS
  Filled 2022-07-12: qty 20

## 2022-07-12 MED ORDER — SODIUM CHLORIDE 0.9 % IV SOLN
2000.0000 mg/m2 | INTRAVENOUS | Status: DC
  Administered 2022-07-12: 5000 mg via INTRAVENOUS
  Filled 2022-07-12: qty 100

## 2022-07-12 MED ORDER — LEUCOVORIN CALCIUM INJECTION 350 MG
400.0000 mg/m2 | Freq: Once | INTRAVENOUS | Status: AC
  Administered 2022-07-12: 972 mg via INTRAVENOUS
  Filled 2022-07-12: qty 48.6

## 2022-07-12 MED ORDER — OXALIPLATIN CHEMO INJECTION 100 MG/20ML
65.0000 mg/m2 | Freq: Once | INTRAVENOUS | Status: AC
  Administered 2022-07-12: 150 mg via INTRAVENOUS
  Filled 2022-07-12: qty 20

## 2022-07-12 NOTE — Progress Notes (Signed)
Patient seen by Dr. Benay Spice today  Vitals are within treatment parameters.  Labs reviewed by Dr. Benay Spice and are within treatment parameters.  Per physician team, patient is ready for treatment. Please note that modifications are being made to the treatment plan including MD has dose reduced the oxaliplatin.

## 2022-07-12 NOTE — Progress Notes (Signed)
Added to GI Conference for 07/19/22, inbasket message sent

## 2022-07-12 NOTE — Patient Instructions (Signed)
Todd Harrison  Discharge Instructions: Thank you for choosing [facility] to provide your oncology and hematology care.   If you have a lab appointment with the [facility], please go directly to the [facility] and check in at the registration area.   Wear comfortable clothing and clothing appropriate for easy access to any Portacath or PICC line.   We strive to give you quality time with your provider. You may need to reschedule your appointment if you arrive late (15 or more minutes).  Arriving late affects you and other patients whose appointments are after yours.  Also, if you miss three or more appointments without notifying the office, you may be dismissed from the clinic at the provider's discretion.      For prescription refill requests, have your pharmacy contact our office and allow 72 hours for refills to be completed.    Today you received the following chemotherapy and/or immunotherapy agents Oxaliplatin, Leucovorin and Adrucil       To help prevent nausea and vomiting after your treatment, we encourage you to take your nausea medication as directed.  BELOW ARE SYMPTOMS THAT SHOULD BE REPORTED IMMEDIATELY: *FEVER GREATER THAN 100.4 F (38 C) OR HIGHER *CHILLS OR SWEATING *NAUSEA AND VOMITING THAT IS NOT CONTROLLED WITH YOUR NAUSEA MEDICATION *UNUSUAL SHORTNESS OF BREATH *UNUSUAL BRUISING OR BLEEDING *URINARY PROBLEMS (pain or burning when urinating, or frequent urination) *BOWEL PROBLEMS (unusual diarrhea, constipation, pain near the anus) TENDERNESS IN MOUTH AND THROAT WITH OR WITHOUT PRESENCE OF ULCERS (sore throat, sores in mouth, or a toothache) UNUSUAL RASH, SWELLING OR PAIN  UNUSUAL VAGINAL DISCHARGE OR ITCHING   Items with * indicate a potential emergency and should be followed up as soon as possible or go to the Emergency Department if any problems should occur.  Please show the CHEMOTHERAPY ALERT CARD or  IMMUNOTHERAPY ALERT CARD at check-in to the Emergency Department and triage nurse.  Should you have questions after your visit or need to cancel or reschedule your appointment, please contact [facility]  Dept: [phone]  and follow the prompts.  Office hours are 8:00 a.m. to 4:30 p.m. Monday - Friday. Please note that voicemails left after 4:00 p.m. may not be returned until the following business day.  We are closed weekends and major holidays. You have access to a nurse at all times for urgent questions. Please call the main number to the clinic Dept: [phone] and follow the prompts.   For any non-urgent questions, you may also contact your provider using MyChart. We now offer e-Visits for anyone 60 and older to request care online for non-urgent symptoms. For details visit [email or web address].   Also download the MyChart app! Go to the app store, search "MyChart", open the app, select [facility], and log in with your MyChart username and password.

## 2022-07-12 NOTE — Progress Notes (Signed)
Brief nutrition follow up completed with patient and wife.  Weight increased to 256 pounds on March 6 from 253 pounds Feb 21.  Labs noted: Glucose 105 and Albumin 3.4  Patient reports he feels ok. He thinks he is eating better.  Drinks 1-2 Ensure Complete daily. States he has enough for now and thinks he may have found a place he can purchase them. Sometimes finds it difficult to think of anything he really wants to eat. Denies nutrition impact symptoms. Noted dose reduction of Oxaliplatin today.  Nutrition Diagnosis: Unintended wt loss stable.  Intervention: Provided supportive listening. Encouraged to continue adequate calories and protein through meals and snacks. Drink 1-2 Ensure Complete daily.  Monitoring, Evaluation, Goals: Tolerate adequate calories and protein to promote wt maintenance and loss of lean body mass.  Next Visit:To be scheduled as needed.

## 2022-07-12 NOTE — Progress Notes (Signed)
Todd Harrison OFFICE PROGRESS NOTE   Diagnosis: Gastric cancer  INTERVAL HISTORY:   Todd Harrison completed another cycle of FOLFOX on 06/28/2022.  No nausea/vomiting, mouth sores, or diarrhea.  He reports prolonged cold sensitivity following the most recent cycle of chemotherapy.  The cold sensitivity persisted until a few days ago.  He reports improvement in nausea and abdominal distention since beginning chemotherapy.  Objective:  Vital signs in last 24 hours:  Blood pressure 109/72, pulse 84, temperature 98.1 F (36.7 C), temperature source Oral, resp. rate 18, height 6' (1.829 m), weight 256 lb (116.1 kg), SpO2 96 %.    HEENT: Thrush or ulcers Lymphatics: No cervical, supraclavicular, axillary, or inguinal nodes Resp: Lungs clear bilaterally Cardio: Regular rate and rhythm GI: Nontender, no mass, no hepatosplenomegaly, soft Vascular: The right lower leg is larger than the left side, no erythema or edema Neuro: Mild loss of vibratory sense at the fingertips bilaterally    Portacath/PICC-without erythema  Lab Results:  Lab Results  Component Value Date   WBC 6.3 07/12/2022   HGB 10.5 (L) 07/12/2022   HCT 32.6 (L) 07/12/2022   MCV 88.8 07/12/2022   PLT 139 (L) 07/12/2022   NEUTROABS 3.9 07/12/2022    CMP  Lab Results  Component Value Date   NA 139 07/12/2022   K 3.7 07/12/2022   CL 106 07/12/2022   CO2 24 07/12/2022   GLUCOSE 105 (H) 07/12/2022   BUN 19 07/12/2022   CREATININE 1.13 07/12/2022   CALCIUM 9.0 07/12/2022   PROT 6.3 (L) 07/12/2022   ALBUMIN 3.4 (L) 07/12/2022   AST 14 (L) 07/12/2022   ALT 8 07/12/2022   ALKPHOS 73 07/12/2022   BILITOT 0.7 07/12/2022   GFRNONAA >60 07/12/2022   GFRAA 74 05/20/2020    Lab Results  Component Value Date   CEA1 <0.6 04/13/2022    Lab Results  Component Value Date   INR 1.1 04/24/2022   LABPROT 14.5 04/24/2022    Imaging:  CT Abdomen Pelvis W Contrast  Result Date: 07/11/2022 CLINICAL DATA:   History of metastatic gastric cancer, assess treatment response. * Tracking Code: BO * EXAM: CT ABDOMEN AND PELVIS WITH CONTRAST TECHNIQUE: Multidetector CT imaging of the abdomen and pelvis was performed using the standard protocol following bolus administration of intravenous contrast. RADIATION DOSE REDUCTION: This exam was performed according to the departmental dose-optimization program which includes automated exposure control, adjustment of the mA and/or kV according to patient size and/or use of iterative reconstruction technique. CONTRAST:  156m OMNIPAQUE IOHEXOL 300 MG/ML  SOLN COMPARISON:  Multiple priors including CT April 13, 2022. FINDINGS: Lower chest: Decreased now trace bilateral pleural effusions. Hepatobiliary: Subtle 14 mm enhancing focus in segment IV B on image 30/2. Stable hypodense lesion in the hepatic dome measuring 13 mm on image 16/2. Similar appearance of the probable vascular malformation in the right hepatic lobe on image 75/2. Gallbladder is distended. No biliary ductal dilation. Pancreas: No pancreatic ductal dilation or evidence of acute inflammation. Spleen: No splenomegaly. Adrenals/Urinary Tract: Bilateral adrenal glands appear normal. No hydronephrosis. Kidneys demonstrate symmetric enhancement and excretion of contrast material. Urinary bladder is unremarkable for degree of distension. Stomach/Bowel: Radiopaque enteric contrast material traverses the ascending colon. Asymmetric wall thickening of the lesser curvature of the stomach for instance on image 29-31/2 extending to the gastric antrum for instance on image 35/2. No pathologic dilation of small or large bowel. Similar circumferential wall thickening of a short segment of distal transverse colon on  image 41/2. Vascular/Lymphatic: Aortic atherosclerosis. Smooth IVC contours. No pathologically enlarged abdominal or pelvic lymph nodes. Reproductive: Prostate is unremarkable. Other: Decreased now small volume ascites.  Increase in the diffuse omental and peritoneal nodularity with the previously indexed implant between the bladder and sigmoid colon now measuring 2.8 cm on image 74/2 previously 1.8 cm Musculoskeletal: Diffuse demineralization of bone. No aggressive lytic or blastic lesion of bone. IMPRESSION: 1. Asymmetric wall thickening of the lesser curvature of the stomach extending to the gastric antrum, may reflect patient's known gastric cancer. 2. Decreased free fluid with increased diffuse omental and peritoneal nodularity compatible with worsening peritoneal carcinomatosis. 3. New subtle segment IVb 14 mm enhancing focus, nonspecific suggest further evaluation by abdominal MRI with and without contrast. 4. Similar circumferential wall thickening of a short segment of distal transverse colon, consider correlation with direct visualization if not previously performed. 5. Decreased now trace bilateral pleural effusions. 6.  Aortic Atherosclerosis (ICD10-I70.0). Electronically Signed   By: Dahlia Bailiff M.D.   On: 07/11/2022 08:46    Medications: I have reviewed the patient's current medications.   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  Internal hemorrhoids.  No specimens collected. Upper endoscopy 04/25/2022-large infiltrative mass with oozing bleeding and stigmata of recent bleeding in the gastric fundus, on the anterior wall of the gastric body and on the greater curvature of the gastric body; deformity in the gastric antrum.  Extrinsic deformity in  the entire duodenum.  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 Her2 by IHC negative, 1+; MMR IHC normal; PD-L1 CPS 1%, positive.  Cycle 1 FOLFOX 05/04/2022 Cycle 2 FOLFOX 05/17/2022 Cycle 3 FOLFOX 05/31/2022-oxaliplatin dose reduced secondary to prolonged cold sensitivity Cycle 4 FOLFOX 06/14/2022 Cycle 5 FOLFOX 06/28/2022 CT abdomen/pelvis 07/08/2022-asymmetric wall thickening at the lesser curvature of the stomach, decreased ascites, increased diffuse omental and peritoneal nodularity Cycle 6 FOLFOX 07/12/2022 Truncal rash-status post recent punch biopsy with pathology pending; biopsy reported negative per family [id number] Lichen planus of the feet Hypothyroidism Atrial fibrillation    Disposition: Todd Harrison has metastatic gastric cancer.  He has completed 5 cycles of FOLFOX.  His clinical status has improved.  Ascites has not reaccumulated over the past several months.  I reviewed the CT findings and images with Todd Harrison and his wife.  The overall findings are consistent with stable disease, though there may be an increase in omental/peritoneal nodularity.  We decided to continue FOLFOX for now.  I will present his case at the GI tumor conference for review of the CT images.  We will follow his clinical status closely and plan for repeat imaging within the next 4 cycles of systemic therapy.  We discussed potential second line systemic therapy options including chemotherapy, immunotherapy, and biologic therapies.  Todd Harrison will return for an office visit and chemotherapy in 2 weeks.    Betsy Coder, MD  07/12/2022  9:14 AM

## 2022-07-12 NOTE — Patient Instructions (Signed)

## 2022-07-12 NOTE — Progress Notes (Signed)
Patient just informed infusion nurse that he had an episode of SOB that woke him from sleep Monday night. Per wife, HR was in the 130s and patient was SOB. Both state this happened previously when patient was dehydrated and both state they feel this was the case again. Wife states patient then started to rehydrate with fluids and HR went back down to his normal after a few hours. Forgot to mention this during his office visit.

## 2022-07-14 ENCOUNTER — Inpatient Hospital Stay: Payer: Medicare HMO

## 2022-07-14 VITALS — BP 101/61 | HR 76 | Temp 98.6°F | Resp 18

## 2022-07-14 DIAGNOSIS — Z5111 Encounter for antineoplastic chemotherapy: Secondary | ICD-10-CM | POA: Diagnosis not present

## 2022-07-14 DIAGNOSIS — C168 Malignant neoplasm of overlapping sites of stomach: Secondary | ICD-10-CM

## 2022-07-14 MED ORDER — HEPARIN SOD (PORK) LOCK FLUSH 100 UNIT/ML IV SOLN
500.0000 [IU] | Freq: Once | INTRAVENOUS | Status: AC | PRN
  Administered 2022-07-14: 500 [IU]

## 2022-07-14 MED ORDER — SODIUM CHLORIDE 0.9% FLUSH
10.0000 mL | INTRAVENOUS | Status: DC | PRN
  Administered 2022-07-14: 10 mL

## 2022-07-14 NOTE — Patient Instructions (Signed)

## 2022-07-19 ENCOUNTER — Other Ambulatory Visit: Payer: Self-pay

## 2022-07-19 NOTE — Progress Notes (Signed)
The proposed treatment discussed in conference is for discussion purpose only and is not a binding recommendation.  The patients have not been physically examined, or presented with their treatment options.  Therefore, final treatment plans cannot be decided.  

## 2022-07-21 ENCOUNTER — Other Ambulatory Visit: Payer: Self-pay | Admitting: Oncology

## 2022-07-21 ENCOUNTER — Telehealth: Payer: Self-pay | Admitting: *Deleted

## 2022-07-21 MED ORDER — TRAMADOL HCL 50 MG PO TABS: 50.0000 mg | ORAL_TABLET | Freq: Four times a day (QID) | ORAL | 0 refills | Status: DC | PRN

## 2022-07-21 NOTE — Telephone Encounter (Signed)
Daughter left VM today that was reviewed at 1600 reporting her father had severe rib cage pain. Called Todd Harrison and he reports right lateral rib cage pain that is severe w/deep breath. Also reports feeling SOB walking in home. Had developed a cough and sputum has had small amount of blood. He denies any fever. He did pick up his Tramdol, but has not taken it. States he feel asleep on sofa arm on this side and asking if it could be muscular. Instructed him to be seen at [facility] emergency room. Need to R/O fracture, pneumonitis, infection, PE. He agrees to go.

## 2022-07-23 ENCOUNTER — Other Ambulatory Visit: Payer: Self-pay | Admitting: Oncology

## 2022-07-24 ENCOUNTER — Emergency Department (HOSPITAL_BASED_OUTPATIENT_CLINIC_OR_DEPARTMENT_OTHER): Payer: Medicare HMO

## 2022-07-24 ENCOUNTER — Other Ambulatory Visit: Payer: Self-pay

## 2022-07-24 ENCOUNTER — Other Ambulatory Visit (HOSPITAL_BASED_OUTPATIENT_CLINIC_OR_DEPARTMENT_OTHER): Payer: Self-pay

## 2022-07-24 ENCOUNTER — Emergency Department (HOSPITAL_BASED_OUTPATIENT_CLINIC_OR_DEPARTMENT_OTHER): Payer: Medicare HMO | Admitting: Radiology

## 2022-07-24 ENCOUNTER — Inpatient Hospital Stay (HOSPITAL_BASED_OUTPATIENT_CLINIC_OR_DEPARTMENT_OTHER)
Admission: EM | Admit: 2022-07-24 | Discharge: 2022-07-28 | DRG: 175 | Disposition: A | Discharge status: Home / Self Care | Payer: Medicare HMO | Attending: Internal Medicine | Admitting: Internal Medicine

## 2022-07-24 ENCOUNTER — Encounter (HOSPITAL_BASED_OUTPATIENT_CLINIC_OR_DEPARTMENT_OTHER): Payer: Self-pay | Admitting: Emergency Medicine

## 2022-07-24 DIAGNOSIS — I358 Other nonrheumatic aortic valve disorders: Secondary | ICD-10-CM | POA: Diagnosis present

## 2022-07-24 DIAGNOSIS — E669 Obesity, unspecified: Secondary | ICD-10-CM | POA: Diagnosis present

## 2022-07-24 DIAGNOSIS — I2609 Other pulmonary embolism with acute cor pulmonale: Secondary | ICD-10-CM | POA: Diagnosis not present

## 2022-07-24 DIAGNOSIS — D630 Anemia in neoplastic disease: Secondary | ICD-10-CM | POA: Diagnosis present

## 2022-07-24 DIAGNOSIS — I82451 Acute embolism and thrombosis of right peroneal vein: Secondary | ICD-10-CM | POA: Diagnosis present

## 2022-07-24 DIAGNOSIS — R000 Tachycardia, unspecified: Secondary | ICD-10-CM | POA: Diagnosis not present

## 2022-07-24 DIAGNOSIS — G4733 Obstructive sleep apnea (adult) (pediatric): Secondary | ICD-10-CM | POA: Diagnosis not present

## 2022-07-24 DIAGNOSIS — Z6834 Body mass index (BMI) 34.0-34.9, adult: Secondary | ICD-10-CM

## 2022-07-24 DIAGNOSIS — Z87891 Personal history of nicotine dependence: Secondary | ICD-10-CM

## 2022-07-24 DIAGNOSIS — I4819 Other persistent atrial fibrillation: Secondary | ICD-10-CM | POA: Diagnosis present

## 2022-07-24 DIAGNOSIS — E440 Moderate protein-calorie malnutrition: Secondary | ICD-10-CM | POA: Diagnosis present

## 2022-07-24 DIAGNOSIS — T451X5A Adverse effect of antineoplastic and immunosuppressive drugs, initial encounter: Secondary | ICD-10-CM | POA: Diagnosis present

## 2022-07-24 DIAGNOSIS — I82411 Acute embolism and thrombosis of right femoral vein: Secondary | ICD-10-CM | POA: Diagnosis present

## 2022-07-24 DIAGNOSIS — K219 Gastro-esophageal reflux disease without esophagitis: Secondary | ICD-10-CM | POA: Diagnosis present

## 2022-07-24 DIAGNOSIS — I4719 Other supraventricular tachycardia: Secondary | ICD-10-CM | POA: Diagnosis not present

## 2022-07-24 DIAGNOSIS — D6481 Anemia due to antineoplastic chemotherapy: Secondary | ICD-10-CM | POA: Diagnosis present

## 2022-07-24 DIAGNOSIS — Z823 Family history of stroke: Secondary | ICD-10-CM

## 2022-07-24 DIAGNOSIS — I480 Paroxysmal atrial fibrillation: Secondary | ICD-10-CM | POA: Diagnosis not present

## 2022-07-24 DIAGNOSIS — J90 Pleural effusion, not elsewhere classified: Secondary | ICD-10-CM | POA: Diagnosis present

## 2022-07-24 DIAGNOSIS — J9601 Acute respiratory failure with hypoxia: Secondary | ICD-10-CM | POA: Diagnosis not present

## 2022-07-24 DIAGNOSIS — E861 Hypovolemia: Secondary | ICD-10-CM | POA: Diagnosis not present

## 2022-07-24 DIAGNOSIS — Z79899 Other long term (current) drug therapy: Secondary | ICD-10-CM

## 2022-07-24 DIAGNOSIS — I82431 Acute embolism and thrombosis of right popliteal vein: Secondary | ICD-10-CM | POA: Diagnosis present

## 2022-07-24 DIAGNOSIS — I82443 Acute embolism and thrombosis of tibial vein, bilateral: Secondary | ICD-10-CM | POA: Diagnosis present

## 2022-07-24 DIAGNOSIS — J45909 Unspecified asthma, uncomplicated: Secondary | ICD-10-CM | POA: Diagnosis present

## 2022-07-24 DIAGNOSIS — E039 Hypothyroidism, unspecified: Secondary | ICD-10-CM | POA: Diagnosis present

## 2022-07-24 DIAGNOSIS — Z833 Family history of diabetes mellitus: Secondary | ICD-10-CM

## 2022-07-24 DIAGNOSIS — C169 Malignant neoplasm of stomach, unspecified: Secondary | ICD-10-CM | POA: Diagnosis present

## 2022-07-24 DIAGNOSIS — M7989 Other specified soft tissue disorders: Secondary | ICD-10-CM | POA: Diagnosis not present

## 2022-07-24 DIAGNOSIS — J4521 Mild intermittent asthma with (acute) exacerbation: Secondary | ICD-10-CM | POA: Diagnosis not present

## 2022-07-24 DIAGNOSIS — C786 Secondary malignant neoplasm of retroperitoneum and peritoneum: Secondary | ICD-10-CM | POA: Diagnosis present

## 2022-07-24 DIAGNOSIS — L439 Lichen planus, unspecified: Secondary | ICD-10-CM | POA: Diagnosis present

## 2022-07-24 DIAGNOSIS — Z9109 Other allergy status, other than to drugs and biological substances: Secondary | ICD-10-CM

## 2022-07-24 DIAGNOSIS — R042 Hemoptysis: Secondary | ICD-10-CM

## 2022-07-24 DIAGNOSIS — Z79890 Hormone replacement therapy (postmenopausal): Secondary | ICD-10-CM

## 2022-07-24 DIAGNOSIS — I2699 Other pulmonary embolism without acute cor pulmonale: Secondary | ICD-10-CM | POA: Diagnosis not present

## 2022-07-24 DIAGNOSIS — Z806 Family history of leukemia: Secondary | ICD-10-CM

## 2022-07-24 DIAGNOSIS — I2602 Saddle embolus of pulmonary artery with acute cor pulmonale: Secondary | ICD-10-CM | POA: Diagnosis not present

## 2022-07-24 DIAGNOSIS — C168 Malignant neoplasm of overlapping sites of stomach: Secondary | ICD-10-CM | POA: Diagnosis present

## 2022-07-24 DIAGNOSIS — Z91199 Patient's noncompliance with other medical treatment and regimen due to unspecified reason: Secondary | ICD-10-CM

## 2022-07-24 DIAGNOSIS — Z79631 Long term (current) use of antimetabolite agent: Secondary | ICD-10-CM

## 2022-07-24 HISTORY — DX: Malignant neoplasm of stomach, unspecified: C16.9

## 2022-07-24 LAB — COMPREHENSIVE METABOLIC PANEL
ALT: 9 U/L (ref 0–44)
AST: 16 U/L (ref 15–41)
Albumin: 3.2 g/dL — ABNORMAL LOW (ref 3.5–5.0)
Alkaline Phosphatase: 76 U/L (ref 38–126)
Anion gap: 11 (ref 5–15)
BUN: 16 mg/dL (ref 8–23)
CO2: 22 mmol/L (ref 22–32)
Calcium: 9.2 mg/dL (ref 8.9–10.3)
Chloride: 105 mmol/L (ref 98–111)
Creatinine, Ser: 1.08 mg/dL (ref 0.61–1.24)
GFR, Estimated: >60 mL/min (ref >60)
Glucose, Bld: 109 mg/dL — ABNORMAL HIGH (ref 70–99)
Potassium: 4 mmol/L (ref 3.5–5.1)
Sodium: 138 mmol/L (ref 135–145)
Total Bilirubin: 0.7 mg/dL (ref 0.3–1.2)
Total Protein: 6.8 g/dL (ref 6.5–8.1)

## 2022-07-24 LAB — CBC WITH DIFFERENTIAL/PLATELET
Abs Immature Granulocytes: 0.05 10*3/uL (ref 0.00–0.07)
Basophils Absolute: 0 10*3/uL (ref 0.0–0.1)
Basophils Relative: 0 %
Eosinophils Absolute: 0 10*3/uL (ref 0.0–0.5)
Eosinophils Relative: 0 %
HCT: 32.4 % — ABNORMAL LOW (ref 39.0–52.0)
Hemoglobin: 10.2 g/dL — ABNORMAL LOW (ref 13.0–17.0)
Immature Granulocytes: 1 %
Lymphocytes Relative: 18 %
Lymphs Abs: 1.6 10*3/uL (ref 0.7–4.0)
MCH: 28.2 pg (ref 26.0–34.0)
MCHC: 31.5 g/dL (ref 30.0–36.0)
MCV: 89.5 fL (ref 80.0–100.0)
Monocytes Absolute: 0.9 10*3/uL (ref 0.1–1.0)
Monocytes Relative: 10 %
Neutro Abs: 6.5 10*3/uL (ref 1.7–7.7)
Neutrophils Relative %: 71 %
Platelets: 182 10*3/uL (ref 150–400)
RBC: 3.62 MIL/uL — ABNORMAL LOW (ref 4.22–5.81)
RDW: 19.8 % — ABNORMAL HIGH (ref 11.5–15.5)
WBC: 9.2 10*3/uL (ref 4.0–10.5)
nRBC: 0 % (ref 0.0–0.2)

## 2022-07-24 LAB — TYPE AND SCREEN
ABO/RH(D): O POS
Antibody Screen: NEGATIVE

## 2022-07-24 LAB — LACTIC ACID, PLASMA: Lactic Acid, Venous: 1.7 mmol/L (ref 0.5–1.9)

## 2022-07-24 LAB — TROPONIN I (HIGH SENSITIVITY): Troponin I (High Sensitivity): 7 ng/L (ref <18)

## 2022-07-24 LAB — PROTIME-INR
INR: 1.3 — ABNORMAL HIGH (ref 0.8–1.2)
Prothrombin Time: 15.7 seconds — ABNORMAL HIGH (ref 11.4–15.2)

## 2022-07-24 LAB — BRAIN NATRIURETIC PEPTIDE: B Natriuretic Peptide: 296.1 pg/mL — ABNORMAL HIGH (ref 0.0–100.0)

## 2022-07-24 LAB — HEPARIN LEVEL (UNFRACTIONATED): Heparin Unfractionated: 0.2 IU/mL — ABNORMAL LOW (ref 0.30–0.70)

## 2022-07-24 LAB — APTT: aPTT: 38 seconds — ABNORMAL HIGH (ref 24–36)

## 2022-07-24 MED ORDER — PANTOPRAZOLE SODIUM 40 MG PO TBEC
40.0000 mg | DELAYED_RELEASE_TABLET | Freq: Every day | ORAL | Status: DC
  Administered 2022-07-25 – 2022-07-28 (×4): 40 mg via ORAL
  Filled 2022-07-24 (×4): qty 1

## 2022-07-24 MED ORDER — IOHEXOL 350 MG/ML SOLN
100.0000 mL | Freq: Once | INTRAVENOUS | Status: AC | PRN
  Administered 2022-07-24: 75 mL via INTRAVENOUS

## 2022-07-24 MED ORDER — LEVOTHYROXINE SODIUM 112 MCG PO TABS
112.0000 ug | ORAL_TABLET | Freq: Every day | ORAL | Status: DC
  Administered 2022-07-25 – 2022-07-28 (×4): 112 ug via ORAL
  Filled 2022-07-24 (×4): qty 1

## 2022-07-24 MED ORDER — HEPARIN BOLUS VIA INFUSION
1500.0000 [IU] | Freq: Once | INTRAVENOUS | Status: AC
  Administered 2022-07-24: 1500 [IU] via INTRAVENOUS
  Filled 2022-07-24: qty 1500

## 2022-07-24 MED ORDER — SENNOSIDES-DOCUSATE SODIUM 8.6-50 MG PO TABS: 1.0000 | ORAL_TABLET | Freq: Every evening | ORAL | Status: DC | PRN

## 2022-07-24 MED ORDER — METOPROLOL SUCCINATE ER 50 MG PO TB24
50.0000 mg | ORAL_TABLET | Freq: Every day | ORAL | Status: DC
  Administered 2022-07-25 – 2022-07-28 (×4): 50 mg via ORAL
  Filled 2022-07-24 (×4): qty 1

## 2022-07-24 MED ORDER — HEPARIN (PORCINE) 25000 UT/250ML-% IV SOLN
1900.0000 [IU]/h | INTRAVENOUS | Status: DC
  Administered 2022-07-24: 1700 [IU]/h via INTRAVENOUS
  Administered 2022-07-25 – 2022-07-27 (×4): 1900 [IU]/h via INTRAVENOUS
  Filled 2022-07-24 (×5): qty 250

## 2022-07-24 MED ORDER — ONDANSETRON HCL 4 MG PO TABS: 4.0000 mg | ORAL_TABLET | Freq: Four times a day (QID) | ORAL | Status: DC | PRN

## 2022-07-24 MED ORDER — OXYCODONE HCL 5 MG PO TABS: 5.0000 mg | ORAL_TABLET | ORAL | Status: DC | PRN

## 2022-07-24 MED ORDER — SODIUM CHLORIDE 0.9% FLUSH
3.0000 mL | Freq: Two times a day (BID) | INTRAVENOUS | Status: DC
  Administered 2022-07-24 – 2022-07-28 (×7): 3 mL via INTRAVENOUS

## 2022-07-24 MED ORDER — ACETAMINOPHEN 650 MG RE SUPP: 650.0000 mg | Freq: Four times a day (QID) | RECTAL | Status: DC | PRN

## 2022-07-24 MED ORDER — SODIUM CHLORIDE 0.9 % IV SOLN
1.0000 g | Freq: Once | INTRAVENOUS | Status: AC
  Administered 2022-07-24: 1 g via INTRAVENOUS
  Filled 2022-07-24: qty 10

## 2022-07-24 MED ORDER — HYDROXYCHLOROQUINE SULFATE 200 MG PO TABS
200.0000 mg | ORAL_TABLET | Freq: Two times a day (BID) | ORAL | Status: DC
  Administered 2022-07-24 – 2022-07-28 (×8): 200 mg via ORAL
  Filled 2022-07-24 (×8): qty 1

## 2022-07-24 MED ORDER — SODIUM CHLORIDE 0.9 % IV SOLN
500.0000 mg | Freq: Once | INTRAVENOUS | Status: AC
  Administered 2022-07-24: 500 mg via INTRAVENOUS
  Filled 2022-07-24: qty 5

## 2022-07-24 MED ORDER — ALBUTEROL SULFATE (2.5 MG/3ML) 0.083% IN NEBU: 2.5000 mg | INHALATION_SOLUTION | Freq: Four times a day (QID) | RESPIRATORY_TRACT | Status: DC | PRN

## 2022-07-24 MED ORDER — ACETAMINOPHEN 325 MG PO TABS: 650.0000 mg | ORAL_TABLET | Freq: Four times a day (QID) | ORAL | Status: DC | PRN

## 2022-07-24 MED ORDER — LORAZEPAM 2 MG/ML IJ SOLN: 1.0000 mg | Freq: Once | INTRAMUSCULAR | Status: DC

## 2022-07-24 MED ORDER — ONDANSETRON HCL 4 MG/2ML IJ SOLN: 4.0000 mg | Freq: Four times a day (QID) | INTRAMUSCULAR | Status: DC | PRN

## 2022-07-24 NOTE — Progress Notes (Signed)
Pt arrived via [facility].  CCMD notified.   VS per protocol.  Hep gtts @ 17cc/hr Oriented to room.  Daughter at bedside

## 2022-07-24 NOTE — ED Notes (Signed)
Marcello Moores will send transport for Bed Ready at [facility] 4E Rm#10.-ABB(NS)

## 2022-07-24 NOTE — Assessment & Plan Note (Addendum)
CT shows significant bilateral PE with large saddle embolus on the right and small saddle embolus on the left.  There is evidence of right heart strain and probable right pulmonary infarcts on imaging.  Currently hemodynamically stable and saturating well on room air.  Has had hemoptysis but no other obvious bleeding. -Continue IV heparin -Obtain echocardiogram and DVT studies -Monitor for any signs/symptoms of bleeding

## 2022-07-24 NOTE — ED Triage Notes (Addendum)
Pt arrives to ED with c/o hemoptysis x1 week. He notes SOB x1 week. He has stage 4 stomach cancer on chemotherapy.

## 2022-07-24 NOTE — Progress Notes (Addendum)
ANTICOAGULATION CONSULT NOTE - Initial Consult  Pharmacy Consult for Heparin Indication: pulmonary embolus  Allergies  Allergen Reactions   Gold Itching   Mixed Grasses     unknown    Patient Measurements: Height: 6' (182.9 cm) Weight: 115.7 kg (255 lb) IBW/kg (Calculated) : 77.6 Heparin Dosing Weight: 102.6 kg  Vital Signs: Temp: 98 F (36.7 C) (03/18 1233) Temp Source: Oral (03/18 1233) BP: 106/68 (03/18 1233) Pulse Rate: 85 (03/18 1233)  Labs: Recent Labs    07/24/22 1200  HGB 10.2*  HCT 32.4*  PLT 182  CREATININE 1.08    Estimated Creatinine Clearance: 78.8 mL/min (by C-G formula based on SCr of 1.08 mg/dL).   Medical History: Past Medical History:  Diagnosis Date   A-fib ([place])    Asthma    Cataract    GERD (gastroesophageal reflux disease)    Lichen planus    Methotrexate, long term, current use    no longer taking   Psoriasis    Sleep apnea    Thyroid disease    Urticaria     Medications:  (Not in a hospital admission)  Scheduled:  Infusions:   azithromycin (ZITHROMAX) 500 mg in sodium chloride 0.9 % 250 mL IVPB 500 mg (07/24/22 1434)   PRN:   Assessment: 64 yom with a history of AF no longer on eliquis secondary to bleeding from stomach cancer. Patient is presenting with hemoptysis. Heparin per pharmacy consult placed for pulmonary embolus.  CTA PE w/ acute PE w/ RHS  Patient is not on anticoagulation prior to arrival.  Hgb 10.2; plt 182  Goal of Therapy:  Heparin level 0.3-0.7 units/ml Monitor platelets by anticoagulation protocol: Yes   Plan:  No initial bolus Start heparin infusion at 1700 units/hr Check anti-Xa level at 2200 and daily while on heparin Continue to monitor H&H and platelets  Lorelei Pont, PharmD, BCPS 07/24/2022 2:48 PM ED Clinical Pharmacist -  [phone]  ADDENDUM 07/24/22 10:54 PM: 2200 Heparin level came back at 0.2 which is sub-therapeutic. Spoke with RN no issues with infusion or bleeding Give  1500 unit IV heparin bolus Increase heparin infusion to 1900 units/hr Check anti-Xa level at 0600 and daily while on heparin Continue to monitor H&H and platelets  Lorelei Pont, PharmD, BCPS 07/24/2022 10:54 PM ED Clinical Pharmacist -  [phone]

## 2022-07-24 NOTE — Assessment & Plan Note (Signed)
-   Continue Plaquenil 

## 2022-07-24 NOTE — H&P (Signed)
History and Physical    RICE BINZ [id number] DOB: Jul 03, 1945 DOA: 07/24/2022  PCP: Lorene Dy, MD  Patient coming from: Home  I have personally briefly reviewed patient's old medical records in [place]  Chief Complaint: Hemoptysis, right chest pain  HPI: Todd Harrison is a 77 y.o. male with medical history significant for metastatic gastric cancer with peritoneal carcinomatosis on FOLFOX, paroxysmal atrial fibrillation not on anticoagulation due to bleeding related to gastric cancer, asthmatic bronchitis, hypothyroidism, lichen planus, OSA not using CPAP who presented to the ED for evaluation of right-sided chest pain associated with dyspnea and hemoptysis.  Patient states about 1 week ago he noticed having intermittent right-sided chest discomfort.  He noticed this occurred more often after he woke up from sleeping on his side on the couch.  He has developed dyspnea with minimal exertion such as standing up over this past week.  He has had cough productive of bloody sputum which has been "quite a bit."  He reports chronic swelling to his right leg which is unchanged from baseline.  Other than hemoptysis he has not noticed any other obvious bleeding such as hematemesis, epistaxis, hematuria, hematochezia, or melena.  He was previously on Eliquis for A-fib but this was discontinued due to prior GI bleeding.  [facility] ED Course  Labs/Imaging on admission: I have personally reviewed following labs and imaging studies.  Initial vitals showed BP 106/68, pulse 85, RR 15, temp 98.0 F, SpO2 96% on room air.  Labs showed WBC 9.2, hemoglobin 10.2, platelets 182,000, sodium 138, potassium 4.0, bicarb 22, BUN 16, creatinine 1.08, serum glucose 109, LFTs within normal limits, troponin 7, BNP 296.1, lactic acid 1.7.  2 view chest x-ray showed right lung base opacity.  CTA chest showed significant bilateral pulmonary emboli with large saddle embolus on the right  and small saddle embolus on the left.  CT evidence of right heart strain consistent with at least submassive PE noted.  Right pleural effusion and probable right pulmonary infarcts also seen.  Peritoneal carcinomatosis noted.  Patient initially given IV ceftriaxone and azithromycin however after CT findings was started on IV heparin.  The hospitalist service was consulted to admit for further evaluation and management.  Review of Systems: All systems reviewed and are negative except as documented in history of present illness above.   Past Medical History:  Diagnosis Date   A-fib ([place])    Asthma    Cataract    Gastric cancer (HCC)    GERD (gastroesophageal reflux disease)    Lichen planus    Methotrexate, long term, current use    no longer taking   Psoriasis    Sleep apnea    Thyroid disease    Urticaria     Past Surgical History:  Procedure Laterality Date   CARDIOVERSION N/A 02/23/2020   Procedure: CARDIOVERSION;  Surgeon: Werner Lean, MD;  Location: [facility];  Service: Cardiovascular;  Laterality: N/A;   CARDIOVERSION N/A 04/12/2020   Procedure: CARDIOVERSION;  Surgeon: Buford Dresser, MD;  Location: [facility];  Service: Cardiovascular;  Laterality: N/A;   CATARACT EXTRACTION Left    2019   IR IMAGING GUIDED PORT INSERTION  05/03/2022   TONSILLECTOMY      Social History:  reports that he quit smoking about 42 years ago. His smoking use included cigarettes. He has never used smokeless tobacco. He reports current alcohol use of about 1.0 standard drink of alcohol per week. He reports that he does not use  drugs.  Allergies  Allergen Reactions   Gold Itching   Mixed Grasses     unknown    Family History  Problem Relation Age of Onset   Diabetes Mother    Stroke Mother    Leukemia Maternal Great-grandmother    Allergic rhinitis Neg Hx    Asthma Neg Hx    Eczema Neg Hx    Urticaria Neg Hx    Colon cancer Neg Hx    Esophageal cancer Neg  Hx    Stomach cancer Neg Hx      Prior to Admission medications   Medication Sig Start Date End Date Taking? Authorizing Provider  acetaminophen (TYLENOL) 500 MG tablet Take 500 mg by mouth every 6 (six) hours as needed for moderate pain.    [provider]  albuterol (VENTOLIN HFA) 108 (90 Base) MCG/ACT inhaler Inhale 2 puffs into the lungs every 6 (six) hours as needed for wheezing or shortness of breath. 06/23/22   Owens Shark, NP  clobetasol ointment (TEMOVATE) [id number] % Apply 1 Application topically 2 (two) times daily as needed (rash). 08/18/20   [provider]  cyanocobalamin (VITAMIN B12) 100 MCG tablet Take 100-200 mcg by mouth daily. Patient not taking: Reported on 07/12/2022    [provider]  fluticasone (FLONASE) 50 MCG/ACT nasal spray Place 1 spray into both nostrils daily as needed for allergies or rhinitis.    [provider]  fluticasone furoate-vilanterol (BREO ELLIPTA) 100-25 MCG/ACT AEPB Inhale 1 puff into the lungs daily. Patient not taking: Reported on 07/12/2022 07/03/22   Garner Nash, DO  hydroxychloroquine (PLAQUENIL) 200 MG tablet Take 200 mg by mouth 2 (two) times daily. 06/17/21   [provider]  hydrOXYzine (ATARAX) 10 MG tablet Take 1 tablet (10 mg total) by mouth 3 (three) times daily as needed. Patient not taking: Reported on 05/31/2022 04/14/22   Ladell Pier, MD  ISOtretinoin (ACCUTANE) 40 MG capsule Take 40 mg by mouth 2 (two) times daily.  01/13/20   [provider]  KLOR-CON M20 20 MEQ tablet TAKE 1 TABLET BY MOUTH EVERY DAY 06/12/22   Ladell Pier, MD  levothyroxine (SYNTHROID) 112 MCG tablet Take 112 mcg by mouth daily before breakfast.    [provider]  lidocaine-prilocaine (EMLA) cream Apply 1 Application topically as needed (Apply 1 hour before coming to [facility] for accessing). 04/27/22   Ladell Pier, MD  metoprolol succinate (TOPROL-XL) 50 MG 24 hr tablet TAKE 1 TABLET BY  MOUTH EVERY DAY 06/13/22   Vickie Epley, MD  metoprolol tartrate (LOPRESSOR) 25 MG tablet Take 1 tablet (25 mg total) by mouth 2 (two) times daily as needed (palpitations). 05/24/22   Freada Bergeron, MD  omeprazole (PRILOSEC) 40 MG capsule Take 40 mg by mouth daily. 04/12/22   [provider]  ondansetron (ZOFRAN) 8 MG tablet Take 1 tablet (8 mg total) by mouth every 8 (eight) hours as needed for nausea or vomiting. Patient not taking: Reported on 07/12/2022 04/17/22   Ladell Pier, MD  polyethylene glycol (MIRALAX / GLYCOLAX) 17 g packet Take 17 g by mouth daily.    [provider]  predniSONE (DELTASONE) 20 MG tablet Take 40 mg daily for 3 days, then 20 mg daily for 3 days, then 10 mg daily if rash recurs Patient not taking: Reported on 07/12/2022 06/14/22   Owens Shark, NP  prochlorperazine (COMPAZINE) 10 MG tablet Take 1 tablet (10 mg total) by mouth  every 6 (six) hours as needed for nausea or vomiting. Patient not taking: Reported on 07/12/2022 04/27/22   Ladell Pier, MD  Pyridoxine HCl (VITAMIN B6 PO) Take 1 tablet by mouth daily. Patient not taking: Reported on 07/12/2022    [provider]  traMADol (ULTRAM) 50 MG tablet Take 1 tablet (50 mg total) by mouth every 6 (six) hours as needed. 07/21/22   Ladell Pier, MD  triamcinolone (KENALOG) 0.1 % Apply 1 application  topically daily as needed (lichen planus flare).    [provider]    Physical Exam: Vitals:   07/24/22 1538 07/24/22 1741 07/24/22 1832 07/24/22 1944  BP: 118/74  97/85 98/66  Pulse: 81   75  Resp: 18   20  Temp:  98.2 F (36.8 C) 98 F (36.7 C) (!) 97.5 F (36.4 C)  TempSrc:  Oral Oral Oral  SpO2: 97%   96%  Weight:      Height:       Constitutional: Sitting up in bed, NAD, calm, comfortable Eyes: EOMI, lids and conjunctivae normal ENMT: Mucous membranes are moist. Posterior pharynx clear of any exudate or lesions.Normal dentition.  Neck: normal, supple, no  masses. Respiratory: clear to auscultation bilaterally, no wheezing, no crackles. Normal respiratory effort. No accessory muscle use.  Cardiovascular: Regular rate and rhythm, no murmurs / rubs / gallops.  +2 right lower extremity edema, no edema left leg. 2+ pedal pulses. Abdomen: no tenderness, no masses palpated.  Musculoskeletal: no clubbing / cyanosis. No joint deformity upper and lower extremities. Good ROM, no contractures. Normal muscle tone.  Skin: no rashes, lesions, ulcers. No induration Neurologic: Sensation intact. Strength 5/5 in all 4.  Psychiatric: Normal judgment and insight. Alert and oriented x 3. Normal mood.   EKG: Personally reviewed. Sinus rhythm, rate 78, PVC present.  Similar to prior.  Assessment/Plan Principal Problem:   Acute saddle pulmonary embolism with acute cor pulmonale (HCC) Active Problems:   Paroxysmal atrial fibrillation (HCC)   Malignant neoplasm of overlapping sites of stomach (HCC)   Acquired hypothyroidism   Asthmatic bronchitis   OSA (obstructive sleep apnea)   Lichen planus   Todd Harrison is a 77 y.o. male with medical history significant for metastatic gastric cancer with peritoneal carcinomatosis on FOLFOX, paroxysmal atrial fibrillation not on anticoagulation due to bleeding related to gastric cancer, asthmatic bronchitis, hypothyroidism, lichen planus, OSA not using CPAP who is admitted with acute bilateral and saddle PE.  Assessment and Plan: * Acute saddle pulmonary embolism with acute cor pulmonale (HCC) CT shows significant bilateral PE with large saddle embolus on the right and small saddle embolus on the left.  There is evidence of right heart strain and probable right pulmonary infarcts on imaging.  Currently hemodynamically stable and saturating well on room air.  Has had hemoptysis but no other obvious bleeding. -Continue IV heparin -Obtain echocardiogram and DVT studies -Monitor for any signs/symptoms of bleeding  Paroxysmal  atrial fibrillation (HCC) He is in sinus rhythm on admission.  He was taken off of Eliquis due to concern for bleeding from gastric cancer.  Now on IV heparin as above.  Has had hemoptysis in setting of PE otherwise has not seen any obvious bleeding. -Continue Toprol-XL 50 mg daily -Continue IV heparin as above  Malignant neoplasm of overlapping sites of stomach ([place]) Follows with oncology, Dr. Benay Spice for metastatic gastric cancer with peritoneal carcinomatosis.  On active treatment with FOLFOX.  Lichen planus Continue Plaquenil.  OSA (obstructive sleep  apnea) Has not been using CPAP.  Asthmatic bronchitis Stable, no wheezing on admit.  Continue albuterol as needed.  Acquired hypothyroidism Continue Synthroid.  DVT prophylaxis: IV heparin Code Status: Full code Family Communication: Discussed with patient, he has discussed with family Disposition Plan: From home and likely discharge to home pending clinical progress Consults called: None Severity of Illness: The appropriate patient status for this patient is INPATIENT. Inpatient status is judged to be reasonable and necessary in order to provide the required intensity of service to ensure the patient's safety. The patient's presenting symptoms, physical exam findings, and initial radiographic and laboratory data in the context of their chronic comorbidities is felt to place them at high risk for further clinical deterioration. Furthermore, it is not anticipated that the patient will be medically stable for discharge from the hospital within 2 midnights of admission.   * I certify that at the point of admission it is my clinical judgment that the patient will require inpatient hospital care spanning beyond 2 midnights from the point of admission due to high intensity of service, high risk for further deterioration and high frequency of surveillance required.Zada Finders MD [facility]  If 7PM-7AM, please contact  night-coverage [email or web address]  07/24/2022, 9:26 PM

## 2022-07-24 NOTE — Assessment & Plan Note (Signed)
Continue Synthroid °

## 2022-07-24 NOTE — Hospital Course (Signed)
Assessment and Plan

## 2022-07-24 NOTE — ED Provider Notes (Signed)
[facility] Provider Note   CSN: [id number] Arrival date & time: 07/24/22  1146     History  Chief Complaint  Patient presents with   Hemoptysis    ANANT GOODRIDGE is a 77 y.o. male with past medical history significant for stage IV stomach cancer currently on chemotherapy, he reports his last chemotherapy was 2 weeks ago who presents with concern for right rib cage/chest pain, hemoptysis intermittently with cough for around 1 week, shortness of breath for 1 week.  Patient was previously anticoagulated secondary to A-fib, but reports that they discontinued his Eliquis secondary to bleeding from his stomach cancer.  He denies left-sided chest pain, nausea, vomiting.  HPI     Home Medications Prior to Admission medications   Medication Sig Start Date End Date Taking? Authorizing Provider  acetaminophen (TYLENOL) 500 MG tablet Take 500 mg by mouth every 6 (six) hours as needed for moderate pain.    [provider]  albuterol (VENTOLIN HFA) 108 (90 Base) MCG/ACT inhaler Inhale 2 puffs into the lungs every 6 (six) hours as needed for wheezing or shortness of breath. 06/23/22   Owens Shark, NP  clobetasol ointment (TEMOVATE) [id number] % Apply 1 Application topically 2 (two) times daily as needed (rash). 08/18/20   [provider]  cyanocobalamin (VITAMIN B12) 100 MCG tablet Take 100-200 mcg by mouth daily. Patient not taking: Reported on 07/12/2022    [provider]  fluticasone (FLONASE) 50 MCG/ACT nasal spray Place 1 spray into both nostrils daily as needed for allergies or rhinitis.    [provider]  fluticasone furoate-vilanterol (BREO ELLIPTA) 100-25 MCG/ACT AEPB Inhale 1 puff into the lungs daily. Patient not taking: Reported on 07/12/2022 07/03/22   Garner Nash, DO  hydroxychloroquine (PLAQUENIL) 200 MG tablet Take 200 mg by mouth 2 (two) times daily. 06/17/21   [provider]  hydrOXYzine  (ATARAX) 10 MG tablet Take 1 tablet (10 mg total) by mouth 3 (three) times daily as needed. Patient not taking: Reported on 05/31/2022 04/14/22   Ladell Pier, MD  ISOtretinoin (ACCUTANE) 40 MG capsule Take 40 mg by mouth 2 (two) times daily.  01/13/20   [provider]  KLOR-CON M20 20 MEQ tablet TAKE 1 TABLET BY MOUTH EVERY DAY 06/12/22   Ladell Pier, MD  levothyroxine (SYNTHROID) 112 MCG tablet Take 112 mcg by mouth daily before breakfast.    [provider]  lidocaine-prilocaine (EMLA) cream Apply 1 Application topically as needed (Apply 1 hour before coming to [facility] for accessing). 04/27/22   Ladell Pier, MD  metoprolol succinate (TOPROL-XL) 50 MG 24 hr tablet TAKE 1 TABLET BY MOUTH EVERY DAY 06/13/22   Vickie Epley, MD  metoprolol tartrate (LOPRESSOR) 25 MG tablet Take 1 tablet (25 mg total) by mouth 2 (two) times daily as needed (palpitations). 05/24/22   Freada Bergeron, MD  omeprazole (PRILOSEC) 40 MG capsule Take 40 mg by mouth daily. 04/12/22   [provider]  ondansetron (ZOFRAN) 8 MG tablet Take 1 tablet (8 mg total) by mouth every 8 (eight) hours as needed for nausea or vomiting. Patient not taking: Reported on 07/12/2022 04/17/22   Ladell Pier, MD  polyethylene glycol (MIRALAX / GLYCOLAX) 17 g packet Take 17 g by mouth daily.    [provider]  predniSONE (DELTASONE) 20 MG tablet Take 40 mg daily for 3 days, then 20 mg daily for 3 days, then 10  mg daily if rash recurs Patient not taking: Reported on 07/12/2022 06/14/22   Owens Shark, NP  prochlorperazine (COMPAZINE) 10 MG tablet Take 1 tablet (10 mg total) by mouth every 6 (six) hours as needed for nausea or vomiting. Patient not taking: Reported on 07/12/2022 04/27/22   Ladell Pier, MD  Pyridoxine HCl (VITAMIN B6 PO) Take 1 tablet by mouth daily. Patient not taking: Reported on 07/12/2022    [provider]  traMADol (ULTRAM) 50 MG tablet Take 1 tablet (50 mg  total) by mouth every 6 (six) hours as needed. 07/21/22   Ladell Pier, MD  triamcinolone (KENALOG) 0.1 % Apply 1 application  topically daily as needed (lichen planus flare).    [provider]      Allergies    Gold and Mixed grasses    Review of Systems   Review of Systems  Respiratory:  Positive for cough and shortness of breath.   All other systems reviewed and are negative.   Physical Exam Updated Vital Signs BP 118/74 (BP Location: Right Arm)   Pulse 81   Temp 98.2 F (36.8 C) (Oral)   Resp 18   Ht 6' (1.829 m)   Wt 115.7 kg   SpO2 97%   BMI 34.58 kg/m  Physical Exam Vitals and nursing note reviewed.  Constitutional:      General: He is not in acute distress.    Appearance: Normal appearance.  HENT:     Head: Normocephalic and atraumatic.  Eyes:     General:        Right eye: No discharge.        Left eye: No discharge.  Cardiovascular:     Rate and Rhythm: Normal rate and regular rhythm.     Heart sounds: No murmur heard.    No friction rub. No gallop.  Pulmonary:     Effort: Pulmonary effort is normal.     Breath sounds: Normal breath sounds.     Comments: Patient endorses pleuritic pain with inspiration, occasional dry to wet cough, tenderness palpation of the right rib cage, no wheezing, rhonchi, crackles. Abdominal:     General: Bowel sounds are normal.     Palpations: Abdomen is soft.  Skin:    General: Skin is warm and dry.     Capillary Refill: Capillary refill takes less than 2 seconds.  Neurological:     Mental Status: He is alert and oriented to person, place, and time.  Psychiatric:        Mood and Affect: Mood normal.        Behavior: Behavior normal.     ED Results / Procedures / Treatments   Labs (all labs ordered are listed, but only abnormal results are displayed) Labs Reviewed  CBC WITH DIFFERENTIAL/PLATELET - Abnormal; Notable for the following components:      Result Value   RBC 3.62 (*)    Hemoglobin 10.2 (*)     HCT 32.4 (*)    RDW 19.8 (*)    All other components within normal limits  COMPREHENSIVE METABOLIC PANEL - Abnormal; Notable for the following components:   Glucose, Bld 109 (*)    Albumin 3.2 (*)    All other components within normal limits  BRAIN NATRIURETIC PEPTIDE - Abnormal; Notable for the following components:   B Natriuretic Peptide 296.1 (*)    All other components within normal limits  PROTIME-INR - Abnormal; Notable for the following components:   Prothrombin Time 15.7 (*)  INR 1.3 (*)    All other components within normal limits  APTT - Abnormal; Notable for the following components:   aPTT 38 (*)    All other components within normal limits  LACTIC ACID, PLASMA  HEPARIN LEVEL (UNFRACTIONATED)  HEPARIN LEVEL (UNFRACTIONATED)  TYPE AND SCREEN  TROPONIN I (HIGH SENSITIVITY)    EKG None  Radiology CT Angio Chest PE W and/or Wo Contrast  Result Date: 07/24/2022 CLINICAL DATA:  Shortness of breath for 1 week and hemoptysis. History of gastric cancer. EXAM: CT ANGIOGRAPHY CHEST WITH CONTRAST TECHNIQUE: Multidetector CT imaging of the chest was performed using the standard protocol during bolus administration of intravenous contrast. Multiplanar CT image reconstructions and MIPs were obtained to evaluate the vascular anatomy. RADIATION DOSE REDUCTION: This exam was performed according to the departmental dose-optimization program which includes automated exposure control, adjustment of the mA and/or kV according to patient size and/or use of iterative reconstruction technique. CONTRAST:  20mL OMNIPAQUE IOHEXOL 350 MG/ML SOLN COMPARISON:  05/03/2022 FINDINGS: Cardiovascular: The heart is normal in size. No pericardial effusion. There is tortuosity and calcification of the thoracic aorta but no dissection. The branch vessels are patent. Three-vessel coronary artery calcifications are noted. Significant bilateral pulmonary emboli with large saddle embolus on the right and small  saddle embolus on the left. Evidence of right heart strain with flattening of the left ventricle. The RV LV ratio is 1.19. Mediastinum/Nodes: No mediastinal or hilar mass or adenopathy. Small scattered lymph nodes are stable. The esophagus is unremarkable. Lungs/Pleura: Right pleural effusion and peripheral areas of probable pulmonary infarcts. No worrisome pulmonary lesions. No pneumothorax. Upper Abdomen: Upper abdominal peritoneal carcinomatosis related to patient's known gastric cancer. Vascular calcifications are noted. Musculoskeletal: No significant bony findings. Review of the MIP images confirms the above findings. IMPRESSION: 1. Positive for acute PE with CT evidence of right heart strain (RV/LV Ratio = 1.19) consistent with at least submassive (intermediate risk) PE. The presence of right heart strain has been associated with an increased risk of morbidity and mortality. Please refer to the "Code PE Focused" order set in EPIC. 2. Right pleural effusion and probable right pulmonary infarcts. 3. Peritoneal carcinomatosis. Aortic Atherosclerosis (ICD10-I70.0). These results were called by telephone at the time of interpretation on 07/24/2022 at 2:37 pm to provider [facility] , who verbally acknowledged these results. Electronically Signed   By: Marijo Sanes M.D.   On: 07/24/2022 14:39   DG Chest 2 View  Result Date: 07/24/2022 CLINICAL DATA:  Hemoptysis EXAM: CHEST - 2 VIEW COMPARISON:  06/23/2022 FINDINGS: Hyperinflation. Right upper chest port with tip along the central SVC above the right atrium, unchanged from prior. No pneumothorax, edema. Normal cardiopericardial silhouette. Developing infiltrate in the right lung base. Trace pleural effusions. IMPRESSION: Developing right lung base opacity. Possible acute infiltrate. Recommend follow-up. Hyperinflation.  Chest port.  Trace pleural effusions. Electronically Signed   By: Jill Side M.D.   On: 07/24/2022 12:41    Procedures Procedures     Medications Ordered in ED Medications  heparin ADULT infusion 100 units/mL (25000 units/251mL) (1,700 Units/hr Intravenous New Bag/Given 07/24/22 1553)  cefTRIAXone (ROCEPHIN) 1 g in sodium chloride 0.9 % 100 mL IVPB (0 g Intravenous Stopped 07/24/22 1457)  azithromycin (ZITHROMAX) 500 mg in sodium chloride 0.9 % 250 mL IVPB (0 mg Intravenous Stopped 07/24/22 1552)  iohexol (OMNIPAQUE) 350 MG/ML injection 100 mL (75 mLs Intravenous Contrast Given 07/24/22 1411)    ED Course/ Medical Decision Making/ A&P  Medical Decision Making Amount and/or Complexity of Data Reviewed Labs: ordered. Radiology: ordered.  Risk Prescription drug management. Decision regarding hospitalization.   This patient is a 77 y.o. male who presents to the ED for concern of shob, this involves an extensive number of treatment options, and is a complaint that carries with it a high risk of complications and morbidity. The emergent differential diagnosis prior to evaluation includes, but is not limited to,  asthma exacerbation, COPD exacerbation, acute upper respiratory infection, acute bronchitis, chronic bronchitis, interstitial lung disease, ARDS, PE, pneumonia, atypical ACS, carbon monoxide poisoning, spontaneous pneumothorax, new CHF vs CHF exacerbation, versus other . This is not an exhaustive differential.   Past Medical History / Co-morbidities / Social History: Stage IV stomach cancer currently undergoing chemotherapy, last chemotherapy 2 weeks ago, previous history of persistent A-fib, he was formally on anticoagulation but it was discontinued secondary to some bleeding in the stomach.  He has obstructive sleep apnea, asthma, acid reflux  Additional history: Chart reviewed. Pertinent results include: Reviewed recent outpatient pulmonology, oncology notes  Physical Exam: Physical exam performed. The pertinent findings include: Patient with some tenderness of the right rib cage,  some pleuritic chest pain, occasional cough, he is not in acute respiratory distress, he is stable oxygen saturation on room air, blood pressure stable, he is afebrile.  He is nontachycardic.  Lab Tests: I ordered, and personally interpreted labs.  The pertinent results include: CBC notable for anemia, hemoglobin 10.2, not significantly changed from baseline.  CMP overall unremarkable, his PT/INR, APTT are mildly elevated, BNP mildly elevated to 96.1.  Initial troponin 11, delta troponin is 7.  His lactic acid is 1.7.   Imaging Studies: I ordered imaging studies including plain film chest x-ray, CT angio PE study. I independently visualized and interpreted imaging which showed questionable right lower lobe infiltrate on chest x-ray, however CT PE study shows that patient has right-sided PE with pulmonary infarct, right-sided heart strain. I agree with the radiologist interpretation.   Cardiac Monitoring:  The patient was maintained on a cardiac monitor.  My attending physician Dr. Melina Copa viewed and interpreted the cardiac monitored which showed an underlying rhythm of: Normal sinus rhythm. I agree with this interpretation.   Medications: I ordered medication including initially administered azithromycin, Rocephin for suspected pneumonia, administered heparin after formal diagnosis of PE was made.  Continues to be hemodynamically stable without tachycardia and stable oxygen saturation on room air, however especially in context of his cancer diagnosis and submassive classification of PE with right heart strain do think that he will need admission at this time.  Consultations Obtained: I requested consultation with the hospitalist, spoke with Dr. Trilby Drummer,  and discussed lab and imaging findings as well as pertinent plan - they recommend: Admission for new PE   Disposition: After consideration of the diagnostic results and the patients response to treatment, I feel that patient would benefit from  hospital admission.   I discussed this case with my attending physician Dr. Johnney Killian who cosigned this note including patient's presenting symptoms, physical exam, and planned diagnostics and interventions. Attending physician stated agreement with plan or made changes to plan which were implemented.    Final Clinical Impression(s) / ED Diagnoses Final diagnoses:  Other acute pulmonary embolism with acute cor pulmonale [facility])    Rx / DC Orders ED Discharge Orders     None         Dorien Chihuahua 07/24/22 1757    Charlesetta Shanks, MD 07/26/22  1003  

## 2022-07-24 NOTE — Assessment & Plan Note (Signed)
Follows with oncology, Dr. Benay Spice for metastatic gastric cancer with peritoneal carcinomatosis.  On active treatment with FOLFOX.

## 2022-07-24 NOTE — Assessment & Plan Note (Signed)
He is in sinus rhythm on admission.  He was taken off of Eliquis due to concern for bleeding from gastric cancer.  Now on IV heparin as above.  Has had hemoptysis in setting of PE otherwise has not seen any obvious bleeding. -Continue Toprol-XL 50 mg daily -Continue IV heparin as above

## 2022-07-24 NOTE — Assessment & Plan Note (Signed)
Stable, no wheezing on admit.  Continue albuterol as needed.

## 2022-07-24 NOTE — Assessment & Plan Note (Signed)
Has not been using CPAP.

## 2022-07-25 ENCOUNTER — Telehealth: Payer: Self-pay | Admitting: Physician Assistant

## 2022-07-25 ENCOUNTER — Inpatient Hospital Stay (HOSPITAL_COMMUNITY): Payer: Medicare HMO

## 2022-07-25 DIAGNOSIS — L439 Lichen planus, unspecified: Secondary | ICD-10-CM

## 2022-07-25 DIAGNOSIS — C168 Malignant neoplasm of overlapping sites of stomach: Secondary | ICD-10-CM | POA: Diagnosis not present

## 2022-07-25 DIAGNOSIS — I2699 Other pulmonary embolism without acute cor pulmonale: Secondary | ICD-10-CM

## 2022-07-25 DIAGNOSIS — M7989 Other specified soft tissue disorders: Secondary | ICD-10-CM | POA: Diagnosis not present

## 2022-07-25 DIAGNOSIS — I2602 Saddle embolus of pulmonary artery with acute cor pulmonale: Secondary | ICD-10-CM | POA: Diagnosis not present

## 2022-07-25 DIAGNOSIS — J4521 Mild intermittent asthma with (acute) exacerbation: Secondary | ICD-10-CM

## 2022-07-25 DIAGNOSIS — R042 Hemoptysis: Secondary | ICD-10-CM | POA: Diagnosis not present

## 2022-07-25 DIAGNOSIS — I2609 Other pulmonary embolism with acute cor pulmonale: Secondary | ICD-10-CM | POA: Diagnosis not present

## 2022-07-25 DIAGNOSIS — E039 Hypothyroidism, unspecified: Secondary | ICD-10-CM | POA: Diagnosis not present

## 2022-07-25 DIAGNOSIS — E440 Moderate protein-calorie malnutrition: Secondary | ICD-10-CM | POA: Insufficient documentation

## 2022-07-25 DIAGNOSIS — I480 Paroxysmal atrial fibrillation: Secondary | ICD-10-CM

## 2022-07-25 DIAGNOSIS — G4733 Obstructive sleep apnea (adult) (pediatric): Secondary | ICD-10-CM

## 2022-07-25 LAB — BASIC METABOLIC PANEL
Anion gap: 9 (ref 5–15)
BUN: 12 mg/dL (ref 8–23)
CO2: 22 mmol/L (ref 22–32)
Calcium: 8.2 mg/dL — ABNORMAL LOW (ref 8.9–10.3)
Chloride: 105 mmol/L (ref 98–111)
Creatinine, Ser: 0.93 mg/dL (ref 0.61–1.24)
GFR, Estimated: >60 mL/min (ref >60)
Glucose, Bld: 99 mg/dL (ref 70–99)
Potassium: 3.7 mmol/L (ref 3.5–5.1)
Sodium: 136 mmol/L (ref 135–145)

## 2022-07-25 LAB — ECHOCARDIOGRAM COMPLETE
AR max vel: 1.98 cm2
AV Area VTI: 2 cm2
AV Area mean vel: 1.97 cm2
AV Mean grad: 15 mmHg
AV Peak grad: 15.7 mmHg
Ao pk vel: 1.98 m/s
Area-P 1/2: 2.69 cm2
Height: 72 in
S' Lateral: 2.7 cm
Weight: 4080 oz

## 2022-07-25 LAB — CBC
HCT: 27.4 % — ABNORMAL LOW (ref 39.0–52.0)
Hemoglobin: 8.9 g/dL — ABNORMAL LOW (ref 13.0–17.0)
MCH: 28.9 pg (ref 26.0–34.0)
MCHC: 32.5 g/dL (ref 30.0–36.0)
MCV: 89 fL (ref 80.0–100.0)
Platelets: 164 10*3/uL (ref 150–400)
RBC: 3.08 MIL/uL — ABNORMAL LOW (ref 4.22–5.81)
RDW: 19.4 % — ABNORMAL HIGH (ref 11.5–15.5)
WBC: 8.5 10*3/uL (ref 4.0–10.5)
nRBC: 0 % (ref 0.0–0.2)

## 2022-07-25 LAB — ABO/RH: ABO/RH(D): O POS

## 2022-07-25 LAB — HEPARIN LEVEL (UNFRACTIONATED)
Heparin Unfractionated: 0.39 IU/mL (ref 0.30–0.70)
Heparin Unfractionated: 0.38 IU/mL (ref 0.30–0.70)

## 2022-07-25 MED ORDER — SODIUM CHLORIDE 0.9% FLUSH
10.0000 mL | INTRAVENOUS | Status: DC | PRN
  Administered 2022-07-25: 10 mL

## 2022-07-25 MED ORDER — ENSURE ENLIVE PO LIQD: 237.0000 mL | ORAL | Status: DC | PRN

## 2022-07-25 MED ORDER — BOOST PLUS PO LIQD
237.0000 mL | Freq: Three times a day (TID) | ORAL | Status: DC
  Administered 2022-07-25 – 2022-07-28 (×7): 237 mL via ORAL
  Filled 2022-07-25 (×11): qty 237

## 2022-07-25 MED ORDER — TRAZODONE HCL 50 MG PO TABS
25.0000 mg | ORAL_TABLET | Freq: Every evening | ORAL | Status: DC | PRN
  Filled 2022-07-25: qty 1

## 2022-07-25 MED ORDER — CHLORHEXIDINE GLUCONATE CLOTH 2 % EX PADS
6.0000 | MEDICATED_PAD | Freq: Every day | CUTANEOUS | Status: DC
  Administered 2022-07-25 – 2022-07-27 (×3): 6 via TOPICAL

## 2022-07-25 MED ORDER — TRIAMCINOLONE ACETONIDE 0.1 % EX CREA
TOPICAL_CREAM | Freq: Every day | CUTANEOUS | Status: DC | PRN
  Filled 2022-07-25: qty 15

## 2022-07-25 MED ORDER — SODIUM CHLORIDE 0.9% FLUSH
10.0000 mL | Freq: Two times a day (BID) | INTRAVENOUS | Status: DC
  Administered 2022-07-25 – 2022-07-28 (×5): 10 mL

## 2022-07-25 MED ORDER — DIPHENHYDRAMINE HCL 25 MG PO CAPS
25.0000 mg | ORAL_CAPSULE | Freq: Four times a day (QID) | ORAL | Status: DC | PRN
  Administered 2022-07-25: 25 mg via ORAL
  Filled 2022-07-25: qty 1

## 2022-07-25 NOTE — Progress Notes (Signed)
IP PROGRESS NOTE  Subjective:   Todd Harrison is known to me with a history of metastatic gastric cancer.  He completed another cycle of FOLFOX on 07/12/2022.  He noted the onset of dyspnea and right-sided chest discomfort beginning approximately 07/18/2022.  He developed hemoptysis over the weekend.  He presented to the emergency room yesterday.  A CT of the chest revealed bilateral pulmonary emboli with a saddle embolus and right heart strain.  Peripheral areas of pulmonary infarcts and a right pleural effusion.  He was admitted placed on IV heparin.  He reports the right-sided chest pain has resolved.  He continues to have small-volume hemoptysis (blood mixed with sputum) when he first coughs.  The hemoptysis has improved. He denies dark or black stool. Objective: Vital signs in last 24 hours: Blood pressure 110/68, pulse 87, temperature 98.2 F (36.8 C), temperature source Oral, resp. rate 20, height 6' (1.829 m), weight 255 lb (115.7 kg), SpO2 94 %.  Intake/Output from previous day: 03/18 0701 - 03/19 0700 In: 660 [P.O.:100; I.V.:210; IV Piggyback:350] Out: 900 [Urine:900]  Physical Exam:  HEENT: No thrush or ulcers Lungs: Mild bilateral wheeze, no respiratory distress Cardiac: Regular rate and rhythm Abdomen: Soft and nontender, no mass Extremities: The right lower leg is larger than the left side, no edema or erythema   Portacath/PICC-without erythema  Lab Results: Recent Labs    07/24/22 1200 07/25/22 0629  WBC 9.2 8.5  HGB 10.2* 8.9*  HCT 32.4* 27.4*  PLT 182 164    BMET Recent Labs    07/24/22 1200 07/25/22 0629  NA 138 136  K 4.0 3.7  CL 105 105  CO2 22 22  GLUCOSE 109* 99  BUN 16 12  CREATININE 1.08 0.93  CALCIUM 9.2 8.2*    Lab Results  Component Value Date   CEA1 <0.6 04/13/2022    Studies/Results: ECHOCARDIOGRAM COMPLETE  Result Date: 07/25/2022    ECHOCARDIOGRAM REPORT   Patient Name:   Todd Harrison Date of Exam: 07/25/2022 Medical Rec #:   [id number]       Height:       72.0 in Accession #:    [id number]      Weight:       255.0 lb Date of Birth:  Feb 08, 1946      BSA:          2.362 m Patient Age:    77 years        BP:           110/68 mmHg Patient Gender: M               HR:           79 bpm. Exam Location:  Inpatient Procedure: 2D Echo, Cardiac Doppler and Color Doppler Indications:    Pulmonary Embolus I26.09  History:        Patient has prior history of Echocardiogram examinations, most                 recent 06/22/2022. Arrythmias:Atrial Fibrillation and                 Tachycardia, Signs/Symptoms:Shortness of Breath; Risk                 Factors:Sleep Apnea. Acute saddle pulmonary embolism with acute                 cor pulmonale.  Sonographer:    Ronny Flurry Referring Phys: [id number] [place]  1. Left ventricular ejection fraction, by estimation, is 55 to 60%. The left ventricle has normal function. The left ventricle has no regional wall motion abnormalities. There is mild concentric left ventricular hypertrophy. Left ventricular diastolic parameters are consistent with Grade I diastolic dysfunction (impaired relaxation).  2. Right ventricular systolic function is normal. The right ventricular size is mildly enlarged. Tricuspid regurgitation signal is inadequate for assessing PA pressure.  3. Left atrial size was mildly dilated.  4. Right atrial size was moderately dilated.  5. The mitral valve is normal in structure. No evidence of mitral valve regurgitation. No evidence of mitral stenosis.  6. The aortic valve is tricuspid. There is moderate calcification of the aortic valve. Aortic valve regurgitation is not visualized. Mild aortic valve stenosis. Aortic valve mean gradient measures 15.0 mmHg.  7. Aortic dilatation noted. There is mild dilatation of the aortic root, measuring 39 mm. There is mild dilatation of the ascending aorta, measuring 40 mm.  8. The inferior vena cava is normal in size with greater than 50%  respiratory variability, suggesting right atrial pressure of 3 mmHg. FINDINGS  Left Ventricle: Left ventricular ejection fraction, by estimation, is 55 to 60%. The left ventricle has normal function. The left ventricle has no regional wall motion abnormalities. The left ventricular internal cavity size was normal in size. There is  mild concentric left ventricular hypertrophy. Left ventricular diastolic parameters are consistent with Grade I diastolic dysfunction (impaired relaxation). Right Ventricle: The right ventricular size is mildly enlarged. No increase in right ventricular wall thickness. Right ventricular systolic function is normal. Tricuspid regurgitation signal is inadequate for assessing PA pressure. Left Atrium: Left atrial size was mildly dilated. Right Atrium: Right atrial size was moderately dilated. Pericardium: There is no evidence of pericardial effusion. Mitral Valve: The mitral valve is normal in structure. There is mild calcification of the mitral valve leaflet(s). Mild mitral annular calcification. No evidence of mitral valve regurgitation. No evidence of mitral valve stenosis. Tricuspid Valve: The tricuspid valve is normal in structure. Tricuspid valve regurgitation is trivial. Aortic Valve: The aortic valve is tricuspid. There is moderate calcification of the aortic valve. Aortic valve regurgitation is not visualized. Mild aortic stenosis is present. Aortic valve mean gradient measures 15.0 mmHg. Aortic valve peak gradient measures 15.7 mmHg. Aortic valve area, by VTI measures 2.00 cm. Pulmonic Valve: The pulmonic valve was normal in structure. Pulmonic valve regurgitation is not visualized. Aorta: Aortic dilatation noted. There is mild dilatation of the aortic root, measuring 39 mm. There is mild dilatation of the ascending aorta, measuring 40 mm. Venous: The inferior vena cava is normal in size with greater than 50% respiratory variability, suggesting right atrial pressure of 3 mmHg.  IAS/Shunts: No atrial level shunt detected by color flow Doppler.  LEFT VENTRICLE PLAX 2D LVIDd:         4.10 cm   Diastology LVIDs:         2.70 cm   LV e' medial:    6.99 cm/s LV PW:         1.50 cm   LV E/e' medial:  8.2 LV IVS:        1.30 cm   LV e' lateral:   11.80 cm/s LVOT diam:     2.20 cm   LV E/e' lateral: 4.9 LV SV:         73 LV SV Index:   31 LVOT Area:     3.80 cm  RIGHT VENTRICLE RV S prime:  18.50 cm/s TAPSE (M-mode): 2.6 cm LEFT ATRIUM           Index        RIGHT ATRIUM           Index LA diam:      4.20 cm 1.78 cm/m   RA Area:     38.90 cm LA Vol (A2C): 36.6 ml 15.49 ml/m  RA Volume:   160.00 ml 67.73 ml/m LA Vol (A4C): 53.0 ml 22.44 ml/m  AORTIC VALVE AV Area (Vmax):    1.98 cm AV Area (Vmean):   1.97 cm AV Area (VTI):     2.00 cm AV Vmax:           198.00 cm/s AV Vmean:          132.367 cm/s AV VTI:            0.366 m AV Peak Grad:      15.7 mmHg AV Mean Grad:      15.0 mmHg LVOT Vmax:         103.33 cm/s LVOT Vmean:        68.600 cm/s LVOT VTI:          0.193 m LVOT/AV VTI ratio: 0.53  AORTA Ao Root diam: 3.90 cm Ao Asc diam:  4.00 cm MITRAL VALVE MV Area (PHT): 2.69 cm    SHUNTS MV Decel Time: 282 msec    Systemic VTI:  0.19 m MV E velocity: 57.50 cm/s  Systemic Diam: 2.20 cm MV A velocity: 74.10 cm/s MV E/A ratio:  0.78 Dalton McleanMD Electronically signed by Franki Monte Signature Date/Time: 07/25/2022/1:01:31 PM    Final    VAS [place] LOWER EXTREMITY VENOUS (DVT)  Result Date: 07/25/2022  Lower Venous DVT Study Patient Name:  MARCIO FURTADO  Date of Exam:   07/25/2022 Medical Rec #: [id number]        Accession #:    [id number] Date of Birth: [birth year]       Patient Gender: M Patient Age:   16 years Exam Location:  [facility] Procedure:      VAS [place] LOWER EXTREMITY VENOUS (DVT) Referring Phys: Roxanne Mins PATEL --------------------------------------------------------------------------------  Indications: Pulmonary embolism, and Chronic RLE swelling. Other Indications: On  Eliquis for A-Fib, but had to stop anticoagulation due to                    GI bleed about 1-2 months ago. Risk Factors: Cancer gastric. Anticoagulation: Heparin. Comparison Study: No prior study. Performing Technologist: McKayla Maag RVT, [place]  Examination Guidelines: A complete evaluation includes B-mode imaging, spectral Doppler, color Doppler, and power Doppler as needed of all accessible portions of each vessel. Bilateral testing is considered an integral part of a complete examination. Limited examinations for reoccurring indications may be performed as noted. The reflux portion of the exam is performed with the patient in reverse Trendelenburg.  +---------+---------------+---------+-----------+----------+--------------+ RIGHT    CompressibilityPhasicitySpontaneityPropertiesThrombus Aging +---------+---------------+---------+-----------+----------+--------------+ CFV      Full           Yes      Yes                                 +---------+---------------+---------+-----------+----------+--------------+ SFJ      Full                                                        +---------+---------------+---------+-----------+----------+--------------+  FV Prox  None           No       No                   Acute          +---------+---------------+---------+-----------+----------+--------------+ FV Mid   None           No       No                   Acute          +---------+---------------+---------+-----------+----------+--------------+ FV DistalNone           No       No                   Acute          +---------+---------------+---------+-----------+----------+--------------+ PFV      Full           Yes      Yes                                 +---------+---------------+---------+-----------+----------+--------------+ POP      None           No       No                   Acute           +---------+---------------+---------+-----------+----------+--------------+ PTV      None           No       No                   Acute          +---------+---------------+---------+-----------+----------+--------------+ PERO     None           No       No                   Acute          +---------+---------------+---------+-----------+----------+--------------+   +---------+---------------+---------+-----------+----------+--------------+ LEFT     CompressibilityPhasicitySpontaneityPropertiesThrombus Aging +---------+---------------+---------+-----------+----------+--------------+ CFV      Full           Yes      Yes                                 +---------+---------------+---------+-----------+----------+--------------+ SFJ      Full                                                        +---------+---------------+---------+-----------+----------+--------------+ FV Prox  Full                                                        +---------+---------------+---------+-----------+----------+--------------+ FV Mid   Full                                                        +---------+---------------+---------+-----------+----------+--------------+  FV DistalFull                                                        +---------+---------------+---------+-----------+----------+--------------+ PFV      Full                                                        +---------+---------------+---------+-----------+----------+--------------+ POP      Full           Yes      Yes                                 +---------+---------------+---------+-----------+----------+--------------+ PTV      None           No       No                   Acute          +---------+---------------+---------+-----------+----------+--------------+ PERO     Full                                                         +---------+---------------+---------+-----------+----------+--------------+     Summary: RIGHT: - Findings consistent with acute deep vein thrombosis involving the right femoral vein, right popliteal vein, right posterior tibial veins, and right peroneal veins. - No cystic structure found in the popliteal fossa.  LEFT: - Findings consistent with acute deep vein thrombosis involving the left posterior tibial veins. - No cystic structure found in the popliteal fossa.  *See table(s) above for measurements and observations.    Preliminary    CT Angio Chest PE W and/or Wo Contrast  Result Date: 07/24/2022 CLINICAL DATA:  Shortness of breath for 1 week and hemoptysis. History of gastric cancer. EXAM: CT ANGIOGRAPHY CHEST WITH CONTRAST TECHNIQUE: Multidetector CT imaging of the chest was performed using the standard protocol during bolus administration of intravenous contrast. Multiplanar CT image reconstructions and MIPs were obtained to evaluate the vascular anatomy. RADIATION DOSE REDUCTION: This exam was performed according to the departmental dose-optimization program which includes automated exposure control, adjustment of the mA and/or kV according to patient size and/or use of iterative reconstruction technique. CONTRAST:  71mL OMNIPAQUE IOHEXOL 350 MG/ML SOLN COMPARISON:  05/03/2022 FINDINGS: Cardiovascular: The heart is normal in size. No pericardial effusion. There is tortuosity and calcification of the thoracic aorta but no dissection. The branch vessels are patent. Three-vessel coronary artery calcifications are noted. Significant bilateral pulmonary emboli with large saddle embolus on the right and small saddle embolus on the left. Evidence of right heart strain with flattening of the left ventricle. The RV LV ratio is 1.19. Mediastinum/Nodes: No mediastinal or hilar mass or adenopathy. Small scattered lymph nodes are stable. The esophagus is unremarkable. Lungs/Pleura: Right pleural effusion and  peripheral areas of probable pulmonary infarcts. No worrisome pulmonary lesions. No pneumothorax. Upper Abdomen: Upper abdominal peritoneal carcinomatosis related to patient's known gastric  cancer. Vascular calcifications are noted. Musculoskeletal: No significant bony findings. Review of the MIP images confirms the above findings. IMPRESSION: 1. Positive for acute PE with CT evidence of right heart strain (RV/LV Ratio = 1.19) consistent with at least submassive (intermediate risk) PE. The presence of right heart strain has been associated with an increased risk of morbidity and mortality. Please refer to the "Code PE Focused" order set in EPIC. 2. Right pleural effusion and probable right pulmonary infarcts. 3. Peritoneal carcinomatosis. Aortic Atherosclerosis (ICD10-I70.0). These results were called by telephone at the time of interpretation on 07/24/2022 at 2:37 pm to provider [facility] , who verbally acknowledged these results. Electronically Signed   By: Marijo Sanes M.D.   On: 07/24/2022 14:39   DG Chest 2 View  Result Date: 07/24/2022 CLINICAL DATA:  Hemoptysis EXAM: CHEST - 2 VIEW COMPARISON:  06/23/2022 FINDINGS: Hyperinflation. Right upper chest port with tip along the central SVC above the right atrium, unchanged from prior. No pneumothorax, edema. Normal cardiopericardial silhouette. Developing infiltrate in the right lung base. Trace pleural effusions. IMPRESSION: Developing right lung base opacity. Possible acute infiltrate. Recommend follow-up. Hyperinflation.  Chest port.  Trace pleural effusions. Electronically Signed   By: Jill Side M.D.   On: 07/24/2022 12:41    Medications: I have reviewed the patient's current medications.  Assessment/Plan: Gastric cancer with CT evidence of abdominal carcinomatosis CT abdomen/pelvis 04/13/2022-ascites, diffuse omental and peritoneal nodularity, possible circumferential mass in the transverse colon, new small left greater than right pleural  effusions CT abdominal mass biopsy 04/24/2022-metastatic poorly differentiated adenocarcinoma with very focal signet ring cell features CT paracentesis 04/24/2022-no malignant cells identified Colonoscopy 04/25/2022-diverticulosis in the sigmoid colon.  Internal hemorrhoids.  No specimens collected. Upper endoscopy 04/25/2022-large infiltrative mass with oozing bleeding and stigmata of recent bleeding in the gastric fundus, on the anterior wall of the gastric body and on the greater curvature of the gastric body; deformity in the gastric antrum.  Extrinsic deformity in the entire duodenum.  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 Her2 by IHC negative, 1+; MMR IHC normal; PD-L1 CPS 1%, positive.  Cycle 1 FOLFOX 05/04/2022 Cycle 2 FOLFOX 05/17/2022 Cycle 3 FOLFOX 05/31/2022-oxaliplatin dose reduced secondary to prolonged cold sensitivity Cycle 4 FOLFOX 06/14/2022 Cycle 5 FOLFOX 06/28/2022 CT abdomen/pelvis 07/08/2022-asymmetric wall thickening at the lesser curvature of the stomach, decreased ascites, increased diffuse omental and peritoneal nodularity Cycle 6 FOLFOX 07/12/2022 Truncal rash-status post recent punch biopsy with pathology pending; biopsy reported negative per family [id number]/8850 Lichen planus of the feet Hypothyroidism Atrial fibrillation Admission 07/24/2022 with acute bilateral pulmonary embolism/right heart strain Heparin anticoagulation 07/24/2022 Doppler 07/24/2022-acute DVT of the right femoral, popliteal, posterior tibial, and peroneal veins, acute left posterior tibial DVT 7.  Anemia secondary to chemotherapy, phlebotomy, and hemoptysis    Mr. Gravley is now at day 14 following cycle 6 FOLFOX given for treatment of metastatic gastric cancer.  He is admitted with symptomatic acute pulmonary embolism.  Chest pain has improved since beginning heparin anticoagulation yesterday.  He had hemoptysis on hospital admission.  This has improved. Mr.  Cherne was not maintained on anticoagulation due to the potential for bleeding with the gastric mass.  He has a history of atrial fibrillation, but appears to be in sinus rhythm at present.  I recommend continuing IV heparin anticoagulation until 07/26/2022.  He can then be converted to Lovenox.  We discussed the risk of bleeding with Lovenox or any form of anticoagulation given the gastric mass.  I do not recommend placement of an IVC filter unless he is unable to tolerate anticoagulation therapy.   He has anemia secondary to chemotherapy, hemoptysis, and phlebotomy.  His case was presented at the GI tumor conference last week.  The plan is to continue FOLFOX chemotherapy and obtain a restaging CT abdomen after cycle 9.  Recommendations: Continue heparin, convert to Lovenox-twice daily therapeutic dosing on 07/26/2022 Discontinue anticoagulation and refer for IVC filter placement if he has creased hemoptysis or GI bleeding while on Lovenox Cancer with chemotherapy planned for 07/26/2022, cycle 7 FOLFOX will be scheduled for 08/09/2022 I will check on him over the next few days and outpatient follow-up will be scheduled at the Cancer center  LOS: 1 day   Betsy Coder, MD   07/25/2022, 3:57 PM

## 2022-07-25 NOTE — Consult Note (Signed)
NAME:  Todd Harrison, MRN:  [id number], DOB:  05/11/1945, LOS: 1 ADMISSION DATE:  07/24/2022, CONSULTATION DATE:  3/19 REFERRING MD:  Dr. Louanne Belton, CHIEF COMPLAINT:  saddle PE   History of Present Illness:  Patient is a 77 year old male with pertinent PMH metastatic gastric cancer with peritoneal carcinomatosis on folfox, a-fib not on AC since 04/2022 due to bleeding related to gastric cancer, asthma, OSA not on CPAP presents to [place] ED on 3/18 with saddle PE.  Patient states he has been having some hemoptysis and right-sided chest pain with dyspnea for about 1 week.  On 3/18 went to [facility] ED for further eval.  Vital stable and on room air.  CXR showing RLL opacity.  CTA chest showing large saddle embolus on right and small saddle embolus on left; submassive PE; RV/LV 1.19; right pleural effusion with probable right pulmonary infarct. Dvt US showing acute dvt involving left posterior tibial veins and acute dvt in right femoral, popliteal, posterial tibial and peroneal veins. Echo LVEF 0000000; grade I diastolic dysfx; moderate calcification of aortic valve w/ mild stenosis. LA 1.7, troponin 7, bnp 296. Started on heparin. PCCM consulted.  Pertinent  Medical History   Past Medical History:  Diagnosis Date   A-fib ([place])    Asthma    Cataract    Gastric cancer ([place])    GERD (gastroesophageal reflux disease)    Lichen planus    Methotrexate, long term, current use    no longer taking   Psoriasis    Sleep apnea    Thyroid disease    Urticaria      Significant Hospital Events: Including procedures, antibiotic start and stop dates in addition to other pertinent events   3/18 saddle pe 3/19 pccm consulted  Interim History / Subjective:  Hemodynamically stable   Objective   Blood pressure 110/68, pulse 87, temperature 98.2 F (36.8 C), temperature source Oral, resp. rate 20, height 6' (1.829 m), weight 115.7 kg, SpO2 94 %.        Intake/Output Summary (Last 24 hours)  at 07/25/2022 1251 Last data filed at 07/25/2022 1200 Gross per 24 hour  Intake 752.12 ml  Output 1600 ml  Net -847.88 ml   Filed Weights   07/24/22 1153  Weight: 115.7 kg    Examination: General:  NAD HEENT: MM pink/moist Neuro: Aox3; MAE CV: s1s2, RRR, no m/r/g PULM:  dim clear BS bilaterally; on room air GI: soft, bsx4 active  Extremities: warm/dry, RLE > LLE Skin: no rashes or lesions    Resolved Hospital Problem list     Assessment & Plan:  Acute saddle PE B/l DVT Plan: -telemetry monitoring -cont heparin gtt; monitor for signs of bleeding given hx of gastric bleeding -IR consulted; do not recommend intervention at this time  OSA Plan: -not on cpap at home  Asthma Plan: -prn albuterol  Hx of metastatic gastric cancer w/ peritoneal carcinomatosis Paroxysmal a-fib Lichen planus Hypothyroidism Plan; -per primary   Best Practice (right click and "Reselect all SmartList Selections" daily)   Per primary  Labs   CBC: Recent Labs  Lab 07/24/22 1200 07/25/22 0629  WBC 9.2 8.5  NEUTROABS 6.5  --   HGB 10.2* 8.9*  HCT 32.4* 27.4*  MCV 89.5 89.0  PLT 182 123456    Basic Metabolic Panel: Recent Labs  Lab 07/24/22 1200 07/25/22 0629  NA 138 136  K 4.0 3.7  CL 105 105  CO2 22 22  GLUCOSE 109* 99  BUN 16 12  CREATININE 1.08 0.93  CALCIUM 9.2 8.2*   GFR: Estimated Creatinine Clearance: 91.5 mL/min (by C-G formula based on SCr of 0.93 mg/dL). Recent Labs  Lab 07/24/22 1200 07/24/22 1455 07/25/22 0629  WBC 9.2  --  8.5  LATICACIDVEN  --  1.7  --     Liver Function Tests: Recent Labs  Lab 07/24/22 1200  AST 16  ALT 9  ALKPHOS 76  BILITOT 0.7  PROT 6.8  ALBUMIN 3.2*   No results for input(s): "LIPASE", "AMYLASE" in the last 168 hours. No results for input(s): "AMMONIA" in the last 168 hours.  ABG    Component Value Date/Time   TCO2 22 04/12/2020 1034     Coagulation Profile: Recent Labs  Lab 07/24/22 1455  INR 1.3*     Cardiac Enzymes: No results for input(s): "CKTOTAL", "CKMB", "CKMBINDEX", "TROPONINI" in the last 168 hours.  HbA1C: No results found for: "HGBA1C"  CBG: No results for input(s): "GLUCAP" in the last 168 hours.  Review of Systems:   Review of Systems  Constitutional:  Negative for fever.  Respiratory:  Positive for cough, hemoptysis and shortness of breath. Negative for wheezing.   Cardiovascular:  Positive for chest pain and leg swelling.  Gastrointestinal:  Negative for nausea and vomiting.     Past Medical History:  He,  has a past medical history of A-fib ([place]), Asthma, Cataract, Gastric cancer ([place]), GERD (gastroesophageal reflux disease), Lichen planus, Methotrexate, long term, current use, Psoriasis, Sleep apnea, Thyroid disease, and Urticaria.   Surgical History:   Past Surgical History:  Procedure Laterality Date   CARDIOVERSION N/A 02/23/2020   Procedure: CARDIOVERSION;  Surgeon: Werner Lean, MD;  Location: [facility];  Service: Cardiovascular;  Laterality: N/A;   CARDIOVERSION N/A 04/12/2020   Procedure: CARDIOVERSION;  Surgeon: Buford Dresser, MD;  Location: [facility];  Service: Cardiovascular;  Laterality: N/A;   CATARACT EXTRACTION Left    2019   IR IMAGING GUIDED PORT INSERTION  05/03/2022   TONSILLECTOMY       Social History:   reports that he quit smoking about 42 years ago. His smoking use included cigarettes. He has never used smokeless tobacco. He reports current alcohol use of about 1.0 standard drink of alcohol per week. He reports that he does not use drugs.   Family History:  His family history includes Diabetes in his mother; Leukemia in his maternal great-grandmother; Stroke in his mother. There is no history of Allergic rhinitis, Asthma, Eczema, Urticaria, Colon cancer, Esophageal cancer, or Stomach cancer.   Allergies Allergies  Allergen Reactions   Gold Itching   Mixed Grasses     unknown     Home Medications   Prior to Admission medications   Medication Sig Start Date End Date Taking? Authorizing Provider  albuterol (VENTOLIN HFA) 108 (90 Base) MCG/ACT inhaler Inhale 2 puffs into the lungs every 6 (six) hours as needed for wheezing or shortness of breath. 06/23/22  Yes Owens Shark, NP  fluticasone (FLONASE) 50 MCG/ACT nasal spray Place 1 spray into both nostrils daily as needed for allergies or rhinitis.   Yes [provider]  hydroxychloroquine (PLAQUENIL) 200 MG tablet Take 200 mg by mouth 2 (two) times daily. 06/17/21  Yes [provider]  ISOtretinoin (ACCUTANE) 40 MG capsule Take 40 mg by mouth 2 (two) times daily.  01/13/20  Yes [provider]  KLOR-CON M20 20 MEQ tablet TAKE 1 TABLET BY MOUTH EVERY DAY 06/12/22  Yes Ladell Pier, MD  levothyroxine (SYNTHROID) 112 MCG tablet Take 112 mcg by mouth daily before breakfast.   Yes [provider]  lidocaine-prilocaine (EMLA) cream Apply 1 Application topically as needed (Apply 1 hour before coming to [facility] for accessing). 04/27/22  Yes Ladell Pier, MD  metoprolol succinate (TOPROL-XL) 50 MG 24 hr tablet TAKE 1 TABLET BY MOUTH EVERY DAY 06/13/22  Yes Vickie Epley, MD  omeprazole (PRILOSEC) 40 MG capsule Take 40 mg by mouth daily. 04/12/22  Yes [provider]  polyethylene glycol (MIRALAX / GLYCOLAX) 17 g packet Take 17 g by mouth daily.   Yes [provider]  traMADol (ULTRAM) 50 MG tablet Take 1 tablet (50 mg total) by mouth every 6 (six) hours as needed. Patient taking differently: Take 50 mg by mouth every 6 (six) hours as needed for moderate pain or severe pain. 07/21/22  Yes Ladell Pier, MD  triamcinolone (KENALOG) 0.1 % Apply 1 application  topically daily as needed (lichen planus flare).   Yes [provider]  fluticasone furoate-vilanterol (BREO ELLIPTA) 100-25 MCG/ACT AEPB Inhale 1 puff into the lungs daily. Patient not taking: Reported on 07/12/2022 07/03/22    Garner Nash, DO  hydrOXYzine (ATARAX) 10 MG tablet Take 1 tablet (10 mg total) by mouth 3 (three) times daily as needed. Patient not taking: Reported on 05/31/2022 04/14/22   Ladell Pier, MD  metoprolol tartrate (LOPRESSOR) 25 MG tablet Take 1 tablet (25 mg total) by mouth 2 (two) times daily as needed (palpitations). Patient not taking: Reported on 07/25/2022 05/24/22   Freada Bergeron, MD  ondansetron (ZOFRAN) 8 MG tablet Take 1 tablet (8 mg total) by mouth every 8 (eight) hours as needed for nausea or vomiting. Patient not taking: Reported on 07/12/2022 04/17/22   Ladell Pier, MD  predniSONE (DELTASONE) 20 MG tablet Take 40 mg daily for 3 days, then 20 mg daily for 3 days, then 10 mg daily if rash recurs Patient not taking: Reported on 07/12/2022 06/14/22   Owens Shark, NP  prochlorperazine (COMPAZINE) 10 MG tablet Take 1 tablet (10 mg total) by mouth every 6 (six) hours as needed for nausea or vomiting. Patient not taking: Reported on 07/12/2022 04/27/22   Ladell Pier, MD  Pyridoxine HCl (VITAMIN B6 PO) Take 1 tablet by mouth daily. Patient not taking: Reported on 07/12/2022    [provider]         JD Rexene Agent [facility] 07/25/2022, 12:51 PM  Please see [email or web address] for pager details.  From 7A-7P if no response, please call [phone]. After hours, please call ELink [phone].

## 2022-07-25 NOTE — Progress Notes (Signed)
PROGRESS NOTE  [name] [id number] DOB: [birth year] DOA: 07/24/2022 PCP: Lorene Dy, MD   LOS: 1 day   Brief narrative:  Todd Harrison is a 77 y.o. male with medical history significant for metastatic gastric cancer with peritoneal carcinomatosis on FOLFOX, paroxysmal atrial fibrillation not on anticoagulation due to bleeding related to gastric cancer, asthmatic bronchitis, hypothyroidism, lichen planus, OSA not using CPAP.  Hospital with hemoptysis and right chest pain and dyspnea for 1 week.  Patient initially presented to [facility] ED with events noted to have stable vitals.  Labs were within normal limits.  Chest x-ray showed right lung base opacity.  CTA of the chest showed a significant bilateral pulmonary emboli with large saddle embolus on the right and small saddle embolus on the left, submassive PE noted.  Patient was then admitted to the hospital with acute bilateral and saddle PE.  Assessment/Plan:  Principal Problem:   Acute saddle pulmonary embolism with acute cor pulmonale (HCC) Active Problems:   Paroxysmal atrial fibrillation (HCC)   Malignant neoplasm of overlapping sites of stomach (HCC)   Acquired hypothyroidism   Asthmatic bronchitis   OSA (obstructive sleep apnea)   Lichen planus  Acute saddle pulmonary embolism with acute cor pulmonale  CT shows significant bilateral PE with large saddle embolus on the right and small saddle embolus on the left.  There is evidence of right heart strain and probable right pulmonary infarcts on imaging.  Patient was hemodynamically stable.  Has been started on IV heparin.  Check 2D echocardiogram pending.  Will follow hem-oncology consultation.  Will also discuss with pulmonary regarding significant findings on the CT scan.  Bilateral lower extremity DVT.  Patient has extensive DVT involving both the lower extremities mostly on the the right.  I have spoken with hem-oncology for consultation.  Currently on heparin  drip.  History of metastatic gastric cancer with peritoneal carcinomatosis on FOLFOX regimen.  Follows up with Dr Learta Codding as outpatient and is on active treatment with FOLFOX regimen.  Anticoagulation was discontinued due to previous history of bleeding.  Paroxysmal atrial fibrillation (HCC) Was taken off Eliquis due to concern of bleeding from gastric cancer.  On IV heparin at this time.  Continue Toprol-XL.    Lichen planus Continue Plaquenil.   OSA (obstructive sleep apnea) Has not been using CPAP.   Asthmatic bronchitis Continue as needed albuterol.   Acquired hypothyroidism Continue Synthroid.   DVT prophylaxis: Heparin drip.  Code Status: Full code  Family Communication: None at bedside.  Status is: Inpatient  Remains inpatient appropriate because: Massive pulmonary embolism, saddle embolism, extensive lower extremity DVT,  Consultants: Hem-oncology Pulmonary-opinion.   Procedures: None  Anti-infectives:  Hydroxychloroquine  Rocephin  Zithromax  Anti-infectives (From admission, onward)    Start     Dose/Rate Route Frequency Ordered Stop   07/24/22 2230  hydroxychloroquine (PLAQUENIL) tablet 200 mg        200 mg Oral 2 times daily 07/24/22 2121     07/24/22 1315  cefTRIAXone (ROCEPHIN) 1 g in sodium chloride 0.9 % 100 mL IVPB        1 g 200 mL/hr over 30 Minutes Intravenous  Once 07/24/22 1311 07/24/22 1457   07/24/22 1315  azithromycin (ZITHROMAX) 500 mg in sodium chloride 0.9 % 250 mL IVPB        500 mg 250 mL/hr over 60 Minutes Intravenous  Once 07/24/22 1311 07/24/22 1552      Subjective:  Today, patient was seen and examined at bedside.  Patient denies shortness of breath at rest, dizziness lightheadedness.  Has mild cough with minimal hemoptysis.  Objective: Vitals:   07/24/22 2335 07/25/22 0345  BP: 130/70 110/68  Pulse: 88 86  Resp: 20   Temp: 97.8 F (36.6 C) 98.2 F (36.8 C)  SpO2: 93% 91%    Intake/Output Summary (Last 24 hours)  at 07/25/2022 1224 Last data filed at 07/25/2022 0700 Gross per 24 hour  Intake 660 ml  Output 900 ml  Net -240 ml   Filed Weights   07/24/22 1153  Weight: 115.7 kg   Body mass index is 34.58 kg/m.   Physical Exam:  GENERAL: Obese.  Patient is alert awake and oriented. Not in obvious distress. HENT: No scleral pallor or icterus. Pupils equally reactive to light. Oral mucosa is moist NECK: is supple, no gross swelling noted. CHEST: Clear to auscultation. No crackles or wheezes.  Diminished breath sounds bilaterally. CVS: S1 and S2 heard, no murmur. Regular rate and rhythm.  ABDOMEN: Soft, non-tender, bowel sounds are present. EXTREMITIES: No edema. CNS: Cranial nerves are intact. No focal motor deficits. SKIN: warm and dry without rashes.  Data Review: I have personally reviewed the following laboratory data and studies,  CBC: Recent Labs  Lab 07/24/22 1200 07/25/22 0629  WBC 9.2 8.5  NEUTROABS 6.5  --   HGB 10.2* 8.9*  HCT 32.4* 27.4*  MCV 89.5 89.0  PLT 182 123456   Basic Metabolic Panel: Recent Labs  Lab 07/24/22 1200 07/25/22 0629  NA 138 136  K 4.0 3.7  CL 105 105  CO2 22 22  GLUCOSE 109* 99  BUN 16 12  CREATININE 1.08 0.93  CALCIUM 9.2 8.2*   Liver Function Tests: Recent Labs  Lab 07/24/22 1200  AST 16  ALT 9  ALKPHOS 76  BILITOT 0.7  PROT 6.8  ALBUMIN 3.2*   No results for input(s): "LIPASE", "AMYLASE" in the last 168 hours. No results for input(s): "AMMONIA" in the last 168 hours. Cardiac Enzymes: No results for input(s): "CKTOTAL", "CKMB", "CKMBINDEX", "TROPONINI" in the last 168 hours. BNP (last 3 results) Recent Labs    07/24/22 1455  BNP 296.1*    ProBNP (last 3 results) No results for input(s): "PROBNP" in the last 8760 hours.  CBG: No results for input(s): "GLUCAP" in the last 168 hours. No results found for this or any previous visit (from the past 240 hour(s)).   Studies: VAS [place] LOWER EXTREMITY VENOUS (DVT)  Result Date:  07/25/2022  Lower Venous DVT Study Patient Name:  Todd Harrison  Date of Exam:   07/25/2022 Medical Rec #: [id number]        Accession #:    [id number] Date of Birth: [birth year]       Patient Gender: M Patient Age:   69 years Exam Location:  [facility] Procedure:      VAS [place] LOWER EXTREMITY VENOUS (DVT) Referring Phys: Roxanne Mins PATEL --------------------------------------------------------------------------------  Indications: Pulmonary embolism, and Chronic RLE swelling. Other Indications: On Eliquis for A-Fib, but had to stop anticoagulation due to                    GI bleed about 1-2 months ago. Risk Factors: Cancer gastric. Anticoagulation: Heparin. Comparison Study: No prior study. Performing Technologist: McKayla Maag RVT, [place]  Examination Guidelines: A complete evaluation includes B-mode imaging, spectral Doppler, color Doppler, and power Doppler as needed of all accessible portions of each vessel. Bilateral testing is considered an integral part of  a complete examination. Limited examinations for reoccurring indications may be performed as noted. The reflux portion of the exam is performed with the patient in reverse Trendelenburg.  +---------+---------------+---------+-----------+----------+--------------+ RIGHT    CompressibilityPhasicitySpontaneityPropertiesThrombus Aging +---------+---------------+---------+-----------+----------+--------------+ CFV      Full           Yes      Yes                                 +---------+---------------+---------+-----------+----------+--------------+ SFJ      Full                                                        +---------+---------------+---------+-----------+----------+--------------+ FV Prox  None           No       No                   Acute          +---------+---------------+---------+-----------+----------+--------------+ FV Mid   None           No       No                   Acute           +---------+---------------+---------+-----------+----------+--------------+ FV DistalNone           No       No                   Acute          +---------+---------------+---------+-----------+----------+--------------+ PFV      Full           Yes      Yes                                 +---------+---------------+---------+-----------+----------+--------------+ POP      None           No       No                   Acute          +---------+---------------+---------+-----------+----------+--------------+ PTV      None           No       No                   Acute          +---------+---------------+---------+-----------+----------+--------------+ PERO     None           No       No                   Acute          +---------+---------------+---------+-----------+----------+--------------+   +---------+---------------+---------+-----------+----------+--------------+ LEFT     CompressibilityPhasicitySpontaneityPropertiesThrombus Aging +---------+---------------+---------+-----------+----------+--------------+ CFV      Full           Yes      Yes                                 +---------+---------------+---------+-----------+----------+--------------+ SFJ  Full                                                        +---------+---------------+---------+-----------+----------+--------------+ FV Prox  Full                                                        +---------+---------------+---------+-----------+----------+--------------+ FV Mid   Full                                                        +---------+---------------+---------+-----------+----------+--------------+ FV DistalFull                                                        +---------+---------------+---------+-----------+----------+--------------+ PFV      Full                                                         +---------+---------------+---------+-----------+----------+--------------+ POP      Full           Yes      Yes                                 +---------+---------------+---------+-----------+----------+--------------+ PTV      None           No       No                   Acute          +---------+---------------+---------+-----------+----------+--------------+ PERO     Full                                                        +---------+---------------+---------+-----------+----------+--------------+     Summary: RIGHT: - Findings consistent with acute deep vein thrombosis involving the right femoral vein, right popliteal vein, right posterior tibial veins, and right peroneal veins. - No cystic structure found in the popliteal fossa.  LEFT: - Findings consistent with acute deep vein thrombosis involving the left posterior tibial veins. - No cystic structure found in the popliteal fossa.  *See table(s) above for measurements and observations.    Preliminary    CT Angio Chest PE W and/or Wo Contrast  Result Date: 07/24/2022 CLINICAL DATA:  Shortness of breath for 1 week and hemoptysis. History of gastric cancer. EXAM: CT ANGIOGRAPHY CHEST WITH CONTRAST TECHNIQUE: Multidetector CT imaging of the chest was performed using the standard protocol during bolus  administration of intravenous contrast. Multiplanar CT image reconstructions and MIPs were obtained to evaluate the vascular anatomy. RADIATION DOSE REDUCTION: This exam was performed according to the departmental dose-optimization program which includes automated exposure control, adjustment of the mA and/or kV according to patient size and/or use of iterative reconstruction technique. CONTRAST:  60mL OMNIPAQUE IOHEXOL 350 MG/ML SOLN COMPARISON:  05/03/2022 FINDINGS: Cardiovascular: The heart is normal in size. No pericardial effusion. There is tortuosity and calcification of the thoracic aorta but no dissection. The branch vessels are  patent. Three-vessel coronary artery calcifications are noted. Significant bilateral pulmonary emboli with large saddle embolus on the right and small saddle embolus on the left. Evidence of right heart strain with flattening of the left ventricle. The RV LV ratio is 1.19. Mediastinum/Nodes: No mediastinal or hilar mass or adenopathy. Small scattered lymph nodes are stable. The esophagus is unremarkable. Lungs/Pleura: Right pleural effusion and peripheral areas of probable pulmonary infarcts. No worrisome pulmonary lesions. No pneumothorax. Upper Abdomen: Upper abdominal peritoneal carcinomatosis related to patient's known gastric cancer. Vascular calcifications are noted. Musculoskeletal: No significant bony findings. Review of the MIP images confirms the above findings. IMPRESSION: 1. Positive for acute PE with CT evidence of right heart strain (RV/LV Ratio = 1.19) consistent with at least submassive (intermediate risk) PE. The presence of right heart strain has been associated with an increased risk of morbidity and mortality. Please refer to the "Code PE Focused" order set in EPIC. 2. Right pleural effusion and probable right pulmonary infarcts. 3. Peritoneal carcinomatosis. Aortic Atherosclerosis (ICD10-I70.0). These results were called by telephone at the time of interpretation on 07/24/2022 at 2:37 pm to provider [facility] , who verbally acknowledged these results. Electronically Signed   By: Marijo Sanes M.D.   On: 07/24/2022 14:39   DG Chest 2 View  Result Date: 07/24/2022 CLINICAL DATA:  Hemoptysis EXAM: CHEST - 2 VIEW COMPARISON:  06/23/2022 FINDINGS: Hyperinflation. Right upper chest port with tip along the central SVC above the right atrium, unchanged from prior. No pneumothorax, edema. Normal cardiopericardial silhouette. Developing infiltrate in the right lung base. Trace pleural effusions. IMPRESSION: Developing right lung base opacity. Possible acute infiltrate. Recommend follow-up.  Hyperinflation.  Chest port.  Trace pleural effusions. Electronically Signed   By: Jill Side M.D.   On: 07/24/2022 12:41      Flora Lipps, MD  [facility] 07/25/2022  If 7PM-7AM, please contact night-coverage

## 2022-07-25 NOTE — Progress Notes (Addendum)
Initial Nutrition Assessment  DOCUMENTATION CODES:   Non-severe (moderate) malnutrition in context of chronic illness  INTERVENTION:   Boost Plus TID, provides 360 kcal and 14 grams of protein Ensure Plus High Protein PRN   HS snack  Magic cup with lunch and dinner, each supplement provides 290 kcal and 9 grams of protein  Reviewed menu with pt to identify protein options  Discussed importance of nutrition during cancer treatment  NUTRITION DIAGNOSIS:   Moderate Malnutrition related to chronic illness (metastatic gastric cancer, dx 12/23) as evidenced by moderate fat depletion, moderate muscle depletion.  GOAL:   Patient will meet greater than or equal to 90% of their needs  MONITOR:   PO intake, Supplement acceptance  REASON FOR ASSESSMENT:   Malnutrition Screening Tool    ASSESSMENT:   Pt with PMH of hypothyroidism, psoriasis lichen planus, long term use of methotrexate (no longer taking), asthma, Afib not on anticoagulation due to bleeding gastric ulcer, s/p cardioversion x 2, sleep apnea not on CPAP, GERD and metastatic gastric cancer with peritoneal carcinomatosis on FOLFOX admitted with acute saddle pulmonary embolism with acute cor pulmonale.   Pt with hemoptysis and R-sided chest pain with dyspnea x 1 week PTA admitted with saddle PE and bilateral DVTs. Spoke with pt and daughter who provide nutrition hx; reviewed outpatient oncology RD notes.  Pt UBW prior to cancer dx was 303 lb, he weighed 267 lb at first outpatient oncology RD visit. Pt continued to lose weight during chemo down to 253 lb. At his last visit 3/6 pt had regained some weight up to 256 lb. Current admission weight, which may be stated is 255 lb.  Reviewed 24 hr recall Pt struggles more after chemo which he receives every 2 weeks. The Sun, Mon, Tue prior to his scheduled Wed chemo tend to be his best days of those 2 weeks.  Breakfast: cheese grits with sausage or omelet or [employer] oatmeal with raisins   Lunch: Ensure Complete Dinner: shared meal with wife from restaurant Pt has been working really hard to continue to eat despite cold sensitivities and taste changes. Pt has more of an aversion to meats as these taste weird to the patient. He tried to drink 1-2 ensure complete supplements per day. Pt tends to stay up later but cannot tolerate drinking a supplement too late in the evening.  Pt is agreeable to continue oral nutrition supplements during his admission.  Noted muscle and fat loss during NFPE. Pt confirms that he has noticed loss of muscle and more weakness.   3/18 admit with saddle PE and bilateral DVTs, started on heparin   On admission chest xray showed R lung base opacity Oncology consulted, pt's next scheduled chemo is next day, 3/20  CTA chest showed significant bilateral pulmonary emboli with large saddle embolus on the right and small saddle embolus on the left. CT evidence of right heart strain consistent with at least submassive PE noted. Right pleural effusion and probable right pulmonary infarcts also seen. Peritoneal carcinomatosis noted.   Medications reviewed and include: synthroid, protonix  Heparin   Labs reviewed:  BNP: 296 Albumin: 3.2 Hgb: 8.9    NUTRITION - FOCUSED PHYSICAL EXAM:  Flowsheet Row Most Recent Value  Orbital Region Moderate depletion  Upper Arm Region No depletion  Thoracic and Lumbar Region No depletion  Buccal Region Moderate depletion  Temple Region No depletion  Clavicle Bone Region No depletion  Clavicle and Acromion Bone Region Moderate depletion  Scapular Bone Region Mild depletion  Dorsal Hand Mild depletion  Patellar Region Moderate depletion  Anterior Thigh Region Moderate depletion  Posterior Calf Region Moderate depletion  Edema (RD Assessment) Mild  Hair Reviewed  Eyes Reviewed  Mouth Reviewed  Skin Reviewed  Nails Reviewed       Diet Order:   Diet Order             Diet regular Room service appropriate?  Yes; Fluid consistency: Thin  Diet effective now                   EDUCATION NEEDS:   Education needs have been addressed  Skin:  Skin Assessment: Reviewed RN Assessment  Last BM:  3/18  Height:   Ht Readings from Last 1 Encounters:  07/24/22 6' (1.829 m)    Weight:   Wt Readings from Last 1 Encounters:  07/24/22 115.7 kg    Ideal Body Weight:  80.9 kg  BMI:  Body mass index is 34.58 kg/m.  Estimated Nutritional Needs:   Kcal:  2100-2400  Protein:  105-120 grams  Fluid:  >2 L/day  Lockie Pares., RD, LDN, CNSC See AMiON for contact information

## 2022-07-25 NOTE — Progress Notes (Signed)
Echocardiogram 2D Echocardiogram has been performed.  Todd Harrison 07/25/2022, 10:40 AM

## 2022-07-25 NOTE — Progress Notes (Signed)
ANTICOAGULATION CONSULT NOTE - Initial Consult  Pharmacy Consult for Heparin Indication: pulmonary embolus  Allergies  Allergen Reactions   Gold Itching   Mixed Grasses     unknown    Patient Measurements: Height: 6' (182.9 cm) Weight: 115.7 kg (255 lb) IBW/kg (Calculated) : 77.6 Heparin Dosing Weight: 102.6 kg  Vital Signs: Pulse Rate: 87 (03/19 0800)  Labs: Recent Labs    07/24/22 1200 07/24/22 1455 07/24/22 2212 07/25/22 0629 07/25/22 1558  HGB 10.2*  --   --  8.9*  --   HCT 32.4*  --   --  27.4*  --   PLT 182  --   --  164  --   APTT  --  38*  --   --   --   LABPROT  --  15.7*  --   --   --   INR  --  1.3*  --   --   --   HEPARINUNFRC  --   --  0.20* 0.39 0.38  CREATININE 1.08  --   --  0.93  --   TROPONINIHS  --  7  --   --   --      Estimated Creatinine Clearance: 91.5 mL/min (by C-G formula based on SCr of 0.93 mg/dL).   Medical History: Past Medical History:  Diagnosis Date   A-fib ([place])    Asthma    Cataract    Gastric cancer ([facility])    GERD (gastroesophageal reflux disease)    Lichen planus    Methotrexate, long term, current use    no longer taking   Psoriasis    Sleep apnea    Thyroid disease    Urticaria    Assessment: 77 yo M with a history of AF no longer on eliquis secondary to bleeding from stomach cancer. Patient is not on anticoagulation prior to arrival. Patient is presenting with hemoptysis found to have PE with right heart strain. Heparin per pharmacy consult placed for pulmonary embolus.  Hgb 8.9, Plt 164 - no issues with bleeding or infusion per RN  HL 0.39 - therapeutic   Repeat heparin level is therapeutic again. Plan to transition to Lovenox in AM.   Goal of Therapy:  Heparin level 0.3-0.7 units/ml Monitor platelets by anticoagulation protocol: Yes   Plan:  Continue heparin infusion at 1900 units/hr Check anti-Xa level daily Continue to monitor H&H and platelets  Onnie Boer, PharmD, BCIDP, AAHIVP, CPP Infectious  Disease Pharmacist 07/25/2022 5:27 PM

## 2022-07-25 NOTE — Progress Notes (Signed)
ANTICOAGULATION CONSULT NOTE - Initial Consult  Pharmacy Consult for Heparin Indication: pulmonary embolus  Allergies  Allergen Reactions   Gold Itching   Mixed Grasses     unknown    Patient Measurements: Height: 6' (182.9 cm) Weight: 115.7 kg (255 lb) IBW/kg (Calculated) : 77.6 Heparin Dosing Weight: 102.6 kg  Vital Signs: Temp: 98.2 F (36.8 C) (03/19 0345) Temp Source: Oral (03/19 0345) BP: 110/68 (03/19 0345) Pulse Rate: 86 (03/19 0345)  Labs: Recent Labs    07/24/22 1200 07/24/22 1455 07/24/22 2212 07/25/22 0629  HGB 10.2*  --   --  8.9*  HCT 32.4*  --   --  27.4*  PLT 182  --   --  164  APTT  --  38*  --   --   LABPROT  --  15.7*  --   --   INR  --  1.3*  --   --   HEPARINUNFRC  --   --  0.20* 0.39  CREATININE 1.08  --   --   --   TROPONINIHS  --  7  --   --      Estimated Creatinine Clearance: 78.8 mL/min (by C-G formula based on SCr of 1.08 mg/dL).   Medical History: Past Medical History:  Diagnosis Date   A-fib ([place])    Asthma    Cataract    Gastric cancer ([facility])    GERD (gastroesophageal reflux disease)    Lichen planus    Methotrexate, long term, current use    no longer taking   Psoriasis    Sleep apnea    Thyroid disease    Urticaria    Assessment: 77 yo M with a history of AF no longer on eliquis secondary to bleeding from stomach cancer. Patient is not on anticoagulation prior to arrival. Patient is presenting with hemoptysis found to have PE with right heart strain. Heparin per pharmacy consult placed for pulmonary embolus.  Hgb 8.9, Plt 164 - no issues with bleeding or infusion per RN  HL 0.39 - therapeutic   Goal of Therapy:  Heparin level 0.3-0.7 units/ml Monitor platelets by anticoagulation protocol: Yes   Plan:  Continue heparin infusion at 1900 units/hr Check anti-Xa level at 1600 and daily while on heparin Continue to monitor H&H and platelets  Wilson Singer, PharmD Clinical Pharmacist 07/25/2022 7:18  AM

## 2022-07-25 NOTE — Progress Notes (Signed)
Bilateral lower extremity venous study completed.   Preliminary results relayed to RN.  Please see CV Procedures for preliminary results.  Ellen Mayol, RVT  11:35 AM 07/25/22

## 2022-07-26 ENCOUNTER — Inpatient Hospital Stay: Payer: Medicare HMO

## 2022-07-26 ENCOUNTER — Inpatient Hospital Stay: Payer: Medicare HMO | Admitting: Nurse Practitioner

## 2022-07-26 DIAGNOSIS — C168 Malignant neoplasm of overlapping sites of stomach: Secondary | ICD-10-CM | POA: Diagnosis not present

## 2022-07-26 DIAGNOSIS — I2602 Saddle embolus of pulmonary artery with acute cor pulmonale: Secondary | ICD-10-CM | POA: Diagnosis not present

## 2022-07-26 DIAGNOSIS — I2609 Other pulmonary embolism with acute cor pulmonale: Secondary | ICD-10-CM | POA: Diagnosis not present

## 2022-07-26 DIAGNOSIS — R000 Tachycardia, unspecified: Secondary | ICD-10-CM | POA: Diagnosis not present

## 2022-07-26 DIAGNOSIS — E039 Hypothyroidism, unspecified: Secondary | ICD-10-CM | POA: Diagnosis not present

## 2022-07-26 DIAGNOSIS — I480 Paroxysmal atrial fibrillation: Secondary | ICD-10-CM | POA: Diagnosis not present

## 2022-07-26 LAB — CBC
HCT: 27.7 % — ABNORMAL LOW (ref 39.0–52.0)
Hemoglobin: 8.9 g/dL — ABNORMAL LOW (ref 13.0–17.0)
MCH: 28.8 pg (ref 26.0–34.0)
MCHC: 32.1 g/dL (ref 30.0–36.0)
MCV: 89.6 fL (ref 80.0–100.0)
Platelets: 186 10*3/uL (ref 150–400)
RBC: 3.09 MIL/uL — ABNORMAL LOW (ref 4.22–5.81)
RDW: 19.4 % — ABNORMAL HIGH (ref 11.5–15.5)
WBC: 6.2 10*3/uL (ref 4.0–10.5)
nRBC: 0 % (ref 0.0–0.2)

## 2022-07-26 LAB — HEPARIN LEVEL (UNFRACTIONATED): Heparin Unfractionated: 0.41 IU/mL (ref 0.30–0.70)

## 2022-07-26 LAB — BRAIN NATRIURETIC PEPTIDE: B Natriuretic Peptide: 431.3 pg/mL — ABNORMAL HIGH (ref 0.0–100.0)

## 2022-07-26 LAB — TROPONIN I (HIGH SENSITIVITY): Troponin I (High Sensitivity): 9 ng/L (ref <18)

## 2022-07-26 MED ORDER — POTASSIUM CHLORIDE CRYS ER 20 MEQ PO TBCR
20.0000 meq | EXTENDED_RELEASE_TABLET | Freq: Every day | ORAL | Status: DC
  Administered 2022-07-26 – 2022-07-28 (×3): 20 meq via ORAL
  Filled 2022-07-26 (×3): qty 1

## 2022-07-26 MED ORDER — POLYETHYLENE GLYCOL 3350 17 G PO PACK
17.0000 g | PACK | Freq: Every day | ORAL | Status: DC
  Administered 2022-07-26 – 2022-07-28 (×3): 17 g
  Filled 2022-07-26 (×3): qty 1

## 2022-07-26 MED ORDER — SODIUM CHLORIDE 0.9 % IV BOLUS
500.0000 mL | Freq: Once | INTRAVENOUS | Status: AC
  Administered 2022-07-26: 500 mL via INTRAVENOUS

## 2022-07-26 MED ORDER — METOPROLOL TARTRATE 5 MG/5ML IV SOLN
2.5000 mg | Freq: Once | INTRAVENOUS | Status: AC
  Administered 2022-07-26: 2.5 mg via INTRAVENOUS
  Filled 2022-07-26: qty 5

## 2022-07-26 MED ORDER — POLYVINYL ALCOHOL 1.4 % OP SOLN
1.0000 [drp] | OPHTHALMIC | Status: DC | PRN
  Administered 2022-07-26 – 2022-07-27 (×2): 1 [drp] via OPHTHALMIC
  Filled 2022-07-26: qty 15

## 2022-07-26 MED ORDER — METOPROLOL TARTRATE 5 MG/5ML IV SOLN
2.5000 mg | Freq: Four times a day (QID) | INTRAVENOUS | Status: DC
  Administered 2022-07-26: 2.5 mg via INTRAVENOUS
  Filled 2022-07-26 (×2): qty 5

## 2022-07-26 MED ORDER — TRAMADOL HCL 50 MG PO TABS: 50.0000 mg | ORAL_TABLET | Freq: Four times a day (QID) | ORAL | Status: DC | PRN

## 2022-07-26 MED ORDER — METOPROLOL TARTRATE 5 MG/5ML IV SOLN: 2.5000 mg | Freq: Once | INTRAVENOUS | Status: DC

## 2022-07-26 MED ORDER — LACTATED RINGERS IV SOLN: INTRAVENOUS | Status: DC

## 2022-07-26 NOTE — Progress Notes (Signed)
   07/26/22 0718  Assess: MEWS Score  Temp 98.2 F (36.8 C)  BP 98/76  MAP (mmHg) 83  Pulse Rate (!) 134  ECG Heart Rate (!) 133  Resp 17  SpO2 96 %  O2 Device Nasal Cannula  O2 Flow Rate (L/min) 2 L/min  Assess: MEWS Score  MEWS Temp 0  MEWS Systolic 1  MEWS Pulse 3  MEWS RR 0  MEWS LOC 0  MEWS Score 4  MEWS Score Color Red  Assess: if the MEWS score is Yellow or Red  Were vital signs taken at a resting state? Yes  Focused Assessment No change from prior assessment  Does the patient meet 2 or more of the SIRS criteria? No  MEWS guidelines implemented  Yes, red  Treat  MEWS Interventions Considered administering scheduled or prn medications/treatments as ordered  Take Vital Signs  Increase Vital Sign Frequency  Red: Q1hr x2, continue Q4hrs until patient remains green for 12hrs  Escalate  MEWS: Escalate Red: Discuss with charge nurse and notify provider. Consider notifying RRT. If remains red for 2 hours consider need for higher level of care  Notify: Charge Nurse/RN  Name of Charge Nurse/RN Notified TEFL teacher  Provider Notification  Provider Name/Title Dr. Louanne Belton  Date Provider Notified 07/26/22  Time Provider Notified [phone]  Method of Notification Page  Notification Reason Change in status  Provider response See new orders  Date of Provider Response 07/26/22  Time of Provider Response 0744  Assess: SIRS CRITERIA  SIRS Temperature  0  SIRS Pulse 1  SIRS Respirations  0  SIRS WBC 0  SIRS Score Sum  1

## 2022-07-26 NOTE — Progress Notes (Signed)
PROGRESS NOTE  AZI TOTIN [id number] DOB: 12/27/1945 DOA: 07/24/2022 PCP: Lorene Dy, MD   LOS: 2 days   Brief narrative:  Todd Harrison is a 77 y.o. male with medical history significant for metastatic gastric cancer with peritoneal carcinomatosis on FOLFOX, paroxysmal atrial fibrillation not on anticoagulation due to bleeding related to gastric cancer, asthmatic bronchitis, hypothyroidism, lichen planus, OSA not using CPAP presented to hospital with hemoptysis and right-sided chest pain and dyspnea for 1 week.  Patient initially presented to [facility] ED with events noted to have stable vitals.  Labs were within normal limits.  Chest x-ray showed right lung base opacity.  CTA of the chest showed a significant bilateral pulmonary emboli with large saddle embolus on the right and small saddle embolus on the left, submassive PE noted.  Patient was then admitted to the hospital with acute bilateral and saddle PE.  Pulmonary and medical oncology was consulted.  Assessment/Plan:  Principal Problem:   Acute saddle pulmonary embolism with acute cor pulmonale (HCC) Active Problems:   Paroxysmal atrial fibrillation (HCC)   Malignant neoplasm of overlapping sites of stomach (HCC)   Acquired hypothyroidism   Asthmatic bronchitis   OSA (obstructive sleep apnea)   Lichen planus   Malnutrition of moderate degree   Hemoptysis   Infarct of lung (HCC)   Pulmonary embolus (HCC)  Acute saddle pulmonary embolism with acute cor pulmonale  CT showed significant bilateral PE with large saddle embolus on the right and small saddle embolus on the left.  There was evidence of right heart strain and probable right pulmonary infarcts on CT imaging.  2D echocardiogram done on 07/25/2022 showed LV ejection fraction of 55 to 60% with no regional wall motion abnormality grade 1 diastolic dysfunction.  Venous duplex ultrasound shows bilateral lower extremity DVT.  Pulmonary on board due to large  pulmonary embolism.  No plans for thrombolytic treatment.  Continue IV heparin.  Will follow heme-onc and pulmonary recommendations.  Bilateral lower extremity DVT.  Patient has extensive DVT involving both the lower extremities mostly on the the right.  Oncology on board.  Continue on IV heparin and transition to Lovenox when hemoglobin is stable.  History of metastatic gastric cancer with peritoneal carcinomatosis on FOLFOX regimen.  Follows up with Dr Learta Codding as outpatient and is on active treatment with FOLFOX regimen.  In the past, anticoagulation was discontinued due to previous history of bleeding.  Currently on heparin drip.  Paroxysmal atrial fibrillation (HCC) Was taken off Eliquis due to concern of bleeding from gastric cancer.  On IV heparin at this time.  Continue Toprol-XL.  Will closely monitor for bleeding.   Lichen planus Continue Plaquenil.   OSA (obstructive sleep apnea) Has not been using CPAP.   Asthmatic bronchitis Continue as needed albuterol.   Acquired hypothyroidism Continue Synthroid.   DVT prophylaxis: Heparin drip.  Code Status: Full code  Family Communication: Spoke with the patient's daughter at bedside.  Status is: Inpatient  Remains inpatient appropriate because: Massive pulmonary embolism, saddle embolism, extensive lower extremity DVT, on IV heparin drip.  Consultants: Hem-oncology Pulmonary  Procedures: None  Anti-infectives:  Hydroxychloroquine    Anti-infectives (From admission, onward)    Start     Dose/Rate Route Frequency Ordered Stop   07/24/22 2230  hydroxychloroquine (PLAQUENIL) tablet 200 mg        200 mg Oral 2 times daily 07/24/22 2121     07/24/22 1315  cefTRIAXone (ROCEPHIN) 1 g in sodium chloride 0.9 % 100 mL  IVPB        1 g 200 mL/hr over 30 Minutes Intravenous  Once 07/24/22 1311 07/24/22 1457   07/24/22 1315  azithromycin (ZITHROMAX) 500 mg in sodium chloride 0.9 % 250 mL IVPB        500 mg 250 mL/hr over 60  Minutes Intravenous  Once 07/24/22 1311 07/24/22 1552      Subjective:  Today, patient was seen and examined at bedside.  Denies any shortness of breath, chest pain, dizziness, lightheadedness and has mild hemoptysis.    Objective: Vitals:   07/26/22 0814 07/26/22 0915  BP: (!) 80/64 (!) 126/94  Pulse: (!) 135 (!) 140  Resp: 18 20  Temp: 98.2 F (36.8 C) 98 F (36.7 C)  SpO2: 93% 96%    Intake/Output Summary (Last 24 hours) at 07/26/2022 1044 Last data filed at 07/26/2022 0720 Gross per 24 hour  Intake 992.85 ml  Output 2276 ml  Net -1283.15 ml    Filed Weights   07/24/22 1153  Weight: 115.7 kg   Body mass index is 34.58 kg/m.   Physical Exam:  GENERAL: Obese.  Patient is alert awake and oriented. Not in obvious distress. HENT: No scleral pallor or icterus. Pupils equally reactive to light. Oral mucosa is moist NECK: is supple, no gross swelling noted. CHEST:  Diminished breath sounds bilaterally. CVS: S1 and S2 heard, no murmur. Regular rate and rhythm.  ABDOMEN: Soft, non-tender, bowel sounds are present. EXTREMITIES: No edema. CNS: Cranial nerves are intact. No focal motor deficits. SKIN: warm and dry without rashes.  Data Review: I have personally reviewed the following laboratory data and studies,  CBC: Recent Labs  Lab 07/24/22 1200 07/25/22 0629 07/26/22 0500  WBC 9.2 8.5 6.2  NEUTROABS 6.5  --   --   HGB 10.2* 8.9* 8.9*  HCT 32.4* 27.4* 27.7*  MCV 89.5 89.0 89.6  PLT 182 164 [id number]    Basic Metabolic Panel: Recent Labs  Lab 07/24/22 1200 07/25/22 0629  NA 138 136  K 4.0 3.7  CL 105 105  CO2 22 22  GLUCOSE 109* 99  BUN 16 12  CREATININE 1.08 0.93  CALCIUM 9.2 8.2*    Liver Function Tests: Recent Labs  Lab 07/24/22 1200  AST 16  ALT 9  ALKPHOS 76  BILITOT 0.7  PROT 6.8  ALBUMIN 3.2*    No results for input(s): "LIPASE", "AMYLASE" in the last 168 hours. No results for input(s): "AMMONIA" in the last 168 hours. Cardiac  Enzymes: No results for input(s): "CKTOTAL", "CKMB", "CKMBINDEX", "TROPONINI" in the last 168 hours. BNP (last 3 results) Recent Labs    07/24/22 1455  BNP 296.1*     ProBNP (last 3 results) No results for input(s): "PROBNP" in the last 8760 hours.  CBG: No results for input(s): "GLUCAP" in the last 168 hours. No results found for this or any previous visit (from the past 240 hour(s)).   Studies: VAS [place] LOWER EXTREMITY VENOUS (DVT)  Result Date: 07/25/2022  Lower Venous DVT Study Patient Name:  DEARIS THIERRY  Date of Exam:   07/25/2022 Medical Rec #: [id number]        Accession #:    [id number] Date of Birth: 02-18-1946       Patient Gender: M Patient Age:   58 years Exam Location:  [facility] Procedure:      VAS [place] LOWER EXTREMITY VENOUS (DVT) Referring Phys: Roxanne Mins PATEL --------------------------------------------------------------------------------  Indications: Pulmonary embolism, and Chronic RLE swelling. Other  Indications: On Eliquis for A-Fib, but had to stop anticoagulation due to                    GI bleed about 1-2 months ago. Risk Factors: Cancer gastric. Anticoagulation: Heparin. Comparison Study: No prior study. Performing Technologist: McKayla Maag RVT, [place]  Examination Guidelines: A complete evaluation includes B-mode imaging, spectral Doppler, color Doppler, and power Doppler as needed of all accessible portions of each vessel. Bilateral testing is considered an integral part of a complete examination. Limited examinations for reoccurring indications may be performed as noted. The reflux portion of the exam is performed with the patient in reverse Trendelenburg.  +---------+---------------+---------+-----------+----------+--------------+ RIGHT    CompressibilityPhasicitySpontaneityPropertiesThrombus Aging +---------+---------------+---------+-----------+----------+--------------+ CFV      Full           Yes      Yes                                  +---------+---------------+---------+-----------+----------+--------------+ SFJ      Full                                                        +---------+---------------+---------+-----------+----------+--------------+ FV Prox  None           No       No                   Acute          +---------+---------------+---------+-----------+----------+--------------+ FV Mid   None           No       No                   Acute          +---------+---------------+---------+-----------+----------+--------------+ FV DistalNone           No       No                   Acute          +---------+---------------+---------+-----------+----------+--------------+ PFV      Full           Yes      Yes                                 +---------+---------------+---------+-----------+----------+--------------+ POP      None           No       No                   Acute          +---------+---------------+---------+-----------+----------+--------------+ PTV      None           No       No                   Acute          +---------+---------------+---------+-----------+----------+--------------+ PERO     None           No       No  Acute          +---------+---------------+---------+-----------+----------+--------------+   +---------+---------------+---------+-----------+----------+--------------+ LEFT     CompressibilityPhasicitySpontaneityPropertiesThrombus Aging +---------+---------------+---------+-----------+----------+--------------+ CFV      Full           Yes      Yes                                 +---------+---------------+---------+-----------+----------+--------------+ SFJ      Full                                                        +---------+---------------+---------+-----------+----------+--------------+ FV Prox  Full                                                         +---------+---------------+---------+-----------+----------+--------------+ FV Mid   Full                                                        +---------+---------------+---------+-----------+----------+--------------+ FV DistalFull                                                        +---------+---------------+---------+-----------+----------+--------------+ PFV      Full                                                        +---------+---------------+---------+-----------+----------+--------------+ POP      Full           Yes      Yes                                 +---------+---------------+---------+-----------+----------+--------------+ PTV      None           No       No                   Acute          +---------+---------------+---------+-----------+----------+--------------+ PERO     Full                                                        +---------+---------------+---------+-----------+----------+--------------+     Summary: RIGHT: - Findings consistent with acute deep vein thrombosis involving the right femoral vein, right popliteal vein, right posterior tibial veins, and right peroneal veins. - No cystic structure found in the popliteal fossa.  LEFT: -  Findings consistent with acute deep vein thrombosis involving the left posterior tibial veins. - No cystic structure found in the popliteal fossa.  *See table(s) above for measurements and observations. Electronically signed by Deitra Mayo MD on 07/25/2022 at 4:41:44 PM.    Final    ECHOCARDIOGRAM COMPLETE  Result Date: 07/25/2022    ECHOCARDIOGRAM REPORT   Patient Name:   TAVARE FOLAN Date of Exam: 07/25/2022 Medical Rec #:  [id number]       Height:       72.0 in Accession #:    [id number]      Weight:       255.0 lb Date of Birth:  01/14/1946      BSA:          2.362 m Patient Age:    70 years        BP:           110/68 mmHg Patient Gender: M               HR:           79 bpm. Exam  Location:  Inpatient Procedure: 2D Echo, Cardiac Doppler and Color Doppler Indications:    Pulmonary Embolus I26.09  History:        Patient has prior history of Echocardiogram examinations, most                 recent 06/22/2022. Arrythmias:Atrial Fibrillation and                 Tachycardia, Signs/Symptoms:Shortness of Breath; Risk                 Factors:Sleep Apnea. Acute saddle pulmonary embolism with acute                 cor pulmonale.  Sonographer:    Ronny Flurry Referring Phys: [id number] [place]  1. Left ventricular ejection fraction, by estimation, is 55 to 60%. The left ventricle has normal function. The left ventricle has no regional wall motion abnormalities. There is mild concentric left ventricular hypertrophy. Left ventricular diastolic parameters are consistent with Grade I diastolic dysfunction (impaired relaxation).  2. Right ventricular systolic function is normal. The right ventricular size is mildly enlarged. Tricuspid regurgitation signal is inadequate for assessing PA pressure.  3. Left atrial size was mildly dilated.  4. Right atrial size was moderately dilated.  5. The mitral valve is normal in structure. No evidence of mitral valve regurgitation. No evidence of mitral stenosis.  6. The aortic valve is tricuspid. There is moderate calcification of the aortic valve. Aortic valve regurgitation is not visualized. Mild aortic valve stenosis. Aortic valve mean gradient measures 15.0 mmHg.  7. Aortic dilatation noted. There is mild dilatation of the aortic root, measuring 39 mm. There is mild dilatation of the ascending aorta, measuring 40 mm.  8. The inferior vena cava is normal in size with greater than 50% respiratory variability, suggesting right atrial pressure of 3 mmHg. FINDINGS  Left Ventricle: Left ventricular ejection fraction, by estimation, is 55 to 60%. The left ventricle has normal function. The left ventricle has no regional wall motion abnormalities. The left  ventricular internal cavity size was normal in size. There is  mild concentric left ventricular hypertrophy. Left ventricular diastolic parameters are consistent with Grade I diastolic dysfunction (impaired relaxation). Right Ventricle: The right ventricular size is mildly enlarged. No increase in right ventricular wall thickness. Right ventricular systolic function is normal. Tricuspid regurgitation  signal is inadequate for assessing PA pressure. Left Atrium: Left atrial size was mildly dilated. Right Atrium: Right atrial size was moderately dilated. Pericardium: There is no evidence of pericardial effusion. Mitral Valve: The mitral valve is normal in structure. There is mild calcification of the mitral valve leaflet(s). Mild mitral annular calcification. No evidence of mitral valve regurgitation. No evidence of mitral valve stenosis. Tricuspid Valve: The tricuspid valve is normal in structure. Tricuspid valve regurgitation is trivial. Aortic Valve: The aortic valve is tricuspid. There is moderate calcification of the aortic valve. Aortic valve regurgitation is not visualized. Mild aortic stenosis is present. Aortic valve mean gradient measures 15.0 mmHg. Aortic valve peak gradient measures 15.7 mmHg. Aortic valve area, by VTI measures 2.00 cm. Pulmonic Valve: The pulmonic valve was normal in structure. Pulmonic valve regurgitation is not visualized. Aorta: Aortic dilatation noted. There is mild dilatation of the aortic root, measuring 39 mm. There is mild dilatation of the ascending aorta, measuring 40 mm. Venous: The inferior vena cava is normal in size with greater than 50% respiratory variability, suggesting right atrial pressure of 3 mmHg. IAS/Shunts: No atrial level shunt detected by color flow Doppler.  LEFT VENTRICLE PLAX 2D LVIDd:         4.10 cm   Diastology LVIDs:         2.70 cm   LV e' medial:    6.99 cm/s LV PW:         1.50 cm   LV E/e' medial:  8.2 LV IVS:        1.30 cm   LV e' lateral:   11.80  cm/s LVOT diam:     2.20 cm   LV E/e' lateral: 4.9 LV SV:         73 LV SV Index:   31 LVOT Area:     3.80 cm  RIGHT VENTRICLE RV S prime:     18.50 cm/s TAPSE (M-mode): 2.6 cm LEFT ATRIUM           Index        RIGHT ATRIUM           Index LA diam:      4.20 cm 1.78 cm/m   RA Area:     38.90 cm LA Vol (A2C): 36.6 ml 15.49 ml/m  RA Volume:   160.00 ml 67.73 ml/m LA Vol (A4C): 53.0 ml 22.44 ml/m  AORTIC VALVE AV Area (Vmax):    1.98 cm AV Area (Vmean):   1.97 cm AV Area (VTI):     2.00 cm AV Vmax:           198.00 cm/s AV Vmean:          132.367 cm/s AV VTI:            0.366 m AV Peak Grad:      15.7 mmHg AV Mean Grad:      15.0 mmHg LVOT Vmax:         103.33 cm/s LVOT Vmean:        68.600 cm/s LVOT VTI:          0.193 m LVOT/AV VTI ratio: 0.53  AORTA Ao Root diam: 3.90 cm Ao Asc diam:  4.00 cm MITRAL VALVE MV Area (PHT): 2.69 cm    SHUNTS MV Decel Time: 282 msec    Systemic VTI:  0.19 m MV E velocity: 57.50 cm/s  Systemic Diam: 2.20 cm MV A velocity: 74.10 cm/s MV E/A ratio:  0.78 Dalton McleanMD  Electronically signed by Franki Monte Signature Date/Time: 07/25/2022/1:01:31 PM    Final    CT Angio Chest PE W and/or Wo Contrast  Result Date: 07/24/2022 CLINICAL DATA:  Shortness of breath for 1 week and hemoptysis. History of gastric cancer. EXAM: CT ANGIOGRAPHY CHEST WITH CONTRAST TECHNIQUE: Multidetector CT imaging of the chest was performed using the standard protocol during bolus administration of intravenous contrast. Multiplanar CT image reconstructions and MIPs were obtained to evaluate the vascular anatomy. RADIATION DOSE REDUCTION: This exam was performed according to the departmental dose-optimization program which includes automated exposure control, adjustment of the mA and/or kV according to patient size and/or use of iterative reconstruction technique. CONTRAST:  60mL OMNIPAQUE IOHEXOL 350 MG/ML SOLN COMPARISON:  05/03/2022 FINDINGS: Cardiovascular: The heart is normal in size. No  pericardial effusion. There is tortuosity and calcification of the thoracic aorta but no dissection. The branch vessels are patent. Three-vessel coronary artery calcifications are noted. Significant bilateral pulmonary emboli with large saddle embolus on the right and small saddle embolus on the left. Evidence of right heart strain with flattening of the left ventricle. The RV LV ratio is 1.19. Mediastinum/Nodes: No mediastinal or hilar mass or adenopathy. Small scattered lymph nodes are stable. The esophagus is unremarkable. Lungs/Pleura: Right pleural effusion and peripheral areas of probable pulmonary infarcts. No worrisome pulmonary lesions. No pneumothorax. Upper Abdomen: Upper abdominal peritoneal carcinomatosis related to patient's known gastric cancer. Vascular calcifications are noted. Musculoskeletal: No significant bony findings. Review of the MIP images confirms the above findings. IMPRESSION: 1. Positive for acute PE with CT evidence of right heart strain (RV/LV Ratio = 1.19) consistent with at least submassive (intermediate risk) PE. The presence of right heart strain has been associated with an increased risk of morbidity and mortality. Please refer to the "Code PE Focused" order set in EPIC. 2. Right pleural effusion and probable right pulmonary infarcts. 3. Peritoneal carcinomatosis. Aortic Atherosclerosis (ICD10-I70.0). These results were called by telephone at the time of interpretation on 07/24/2022 at 2:37 pm to provider [facility] , who verbally acknowledged these results. Electronically Signed   By: Marijo Sanes M.D.   On: 07/24/2022 14:39   DG Chest 2 View  Result Date: 07/24/2022 CLINICAL DATA:  Hemoptysis EXAM: CHEST - 2 VIEW COMPARISON:  06/23/2022 FINDINGS: Hyperinflation. Right upper chest port with tip along the central SVC above the right atrium, unchanged from prior. No pneumothorax, edema. Normal cardiopericardial silhouette. Developing infiltrate in the right lung base.  Trace pleural effusions. IMPRESSION: Developing right lung base opacity. Possible acute infiltrate. Recommend follow-up. Hyperinflation.  Chest port.  Trace pleural effusions. Electronically Signed   By: Jill Side M.D.   On: 07/24/2022 12:41      Flora Lipps, MD  [facility] 07/26/2022  If 7PM-7AM, please contact night-coverage

## 2022-07-26 NOTE — Progress Notes (Signed)
[place] for Heparin Indication: pulmonary embolus, b/l DVT  Allergies  Allergen Reactions   Gold Itching   Mixed Grasses     unknown    Patient Measurements: Height: 6' (182.9 cm) Weight: 115.7 kg (255 lb) IBW/kg (Calculated) : 77.6 Heparin Dosing Weight: 102.6 kg  Vital Signs: Temp: 98.2 F (36.8 C) (03/20 0718) Temp Source: Oral (03/20 0718) BP: 98/76 (03/20 0718) Pulse Rate: 134 (03/20 0718)  Labs: Recent Labs    07/24/22 1200 07/24/22 1455 07/24/22 2212 07/25/22 0629 07/25/22 1558 07/26/22 0500  HGB 10.2*  --   --  8.9*  --  8.9*  HCT 32.4*  --   --  27.4*  --  27.7*  PLT 182  --   --  164  --  186  APTT  --  38*  --   --   --   --   LABPROT  --  15.7*  --   --   --   --   INR  --  1.3*  --   --   --   --   HEPARINUNFRC  --   --    < > 0.39 0.38 0.41  CREATININE 1.08  --   --  0.93  --   --   TROPONINIHS  --  7  --   --   --   --    < > = values in this interval not displayed.     Estimated Creatinine Clearance: 91.5 mL/min (by C-G formula based on SCr of 0.93 mg/dL).   Medical History: Past Medical History:  Diagnosis Date   A-fib ([place])    Asthma    Cataract    Gastric cancer (HCC)    GERD (gastroesophageal reflux disease)    Lichen planus    Methotrexate, long term, current use    no longer taking   Psoriasis    Sleep apnea    Thyroid disease    Urticaria    Assessment: 77 yo M with a history of AF no longer on eliquis secondary to bleeding from stomach cancer. Patient is not on anticoagulation prior to arrival. Patient is presenting with hemoptysis found to have PE with right heart strain. Heparin per pharmacy consult placed for pulmonary embolus. Pt also with extensive b/l DVTs.  Heparin level remains therapeutic (0.41) on infusion at 1900 units/hr. Hgb low but stable x 48hrs, plt wnl. No bleeding noted.  Noted hem/onc plans to continue heparin until 3/20 then convert to Lovenox.   Goal of Therapy:   Heparin level 0.3-0.7 units/ml Monitor platelets by anticoagulation protocol: Yes   Plan:  Continue heparin infusion at 1900 units/hr Check anti-Xa level daily Continue to monitor H&H and platelets F/u transition to Lovenox  Sherlon Handing, PharmD, BCPS Please see amion for complete clinical pharmacist phone list 07/26/2022 7:29 AM

## 2022-07-26 NOTE — Progress Notes (Signed)
NAME:  Todd Harrison, MRN:  [id number], DOB:  [birth year], LOS: 2 ADMISSION DATE:  07/24/2022, CONSULTATION DATE:  3/19 REFERRING MD:  Dr. Louanne Belton, CHIEF COMPLAINT:  saddle PE   History of Present Illness:  Patient is a 77 year old male with pertinent PMH metastatic gastric cancer with peritoneal carcinomatosis on folfox, a-fib not on AC since 04/2022 due to bleeding related to gastric cancer, asthma, OSA not on CPAP presents to [facility] on 3/18 with saddle PE.  Patient states he has been having some hemoptysis and right-sided chest pain with dyspnea for about 1 week.  On 3/18 went to [place] ED for further eval.  Vital stable and on room air.  CXR showing RLL opacity.  CTA chest showing large saddle embolus on right and small saddle embolus on left; submassive PE; RV/LV 1.19; right pleural effusion with probable right pulmonary infarct. Dvt US showing acute dvt involving left posterior tibial veins and acute dvt in right femoral, popliteal, posterial tibial and peroneal veins. Echo LVEF 0000000; grade I diastolic dysfx; moderate calcification of aortic valve w/ mild stenosis. LA 1.7, troponin 7, bnp 296. Started on heparin. PCCM consulted.  Pertinent  Medical History   Past Medical History:  Diagnosis Date   A-fib ([place])    Asthma    Cataract    Gastric cancer ([place])    GERD (gastroesophageal reflux disease)    Lichen planus    Methotrexate, long term, current use    no longer taking   Psoriasis    Sleep apnea    Thyroid disease    Urticaria      Significant Hospital Events: Including procedures, antibiotic start and stop dates in addition to other pertinent events   3/18 saddle pe 3/19 pccm consulted 07/26/2022 developed tachycardia  Interim History / Subjective:  Developed what appears to be a sinus tachycardia 137   Objective   Blood pressure (!) 126/94, pulse (!) 140, temperature 98 F (36.7 C), temperature source Oral, resp. rate 20, height 6' (1.829 m), weight  115.7 kg, SpO2 96 %.        Intake/Output Summary (Last 24 hours) at 07/26/2022 [id number] Last data filed at 07/26/2022 0720 Gross per 24 hour  Intake 1011.66 ml  Output 2276 ml  Net -1264.34 ml    Filed Weights   07/24/22 1153  Weight: 115.7 kg    Examination: General: 77 year old male no acute distress HEENT: MM pink/moist no JVD is appreciated Neuro: Grossly intact without focal defect CV: Heart sounds are regular what appears to be a sinus tachycardia at 137 PULM: Diminished in the bases   GI: soft, bsx4 active  GU: Voids Extremities: warm/dry, tender right ankle negative edema  Skin: no rashes or lesions    Resolved Hospital Problem list     Assessment & Plan:  Acute saddle PE B/l DVT Plan: Continue telemetry monitoring Heparin drip No invasive interventions are planned Anticoagulation may be an issue due to his history of cancer may need IVC filter.  OSA Plan: Noncompliant with CPAP  Asthma Plan: As needed albuterol  Tachycardia most likely A-fib rapid trickle response versus sinus tachycardia in the setting of dehydration and stress.  Questionable component of beta blocker withdrawal. Check twelve-lead Resume metoprolol Rehydration IV metoprolol low-dose Continue to monitor telemetry  Hx of metastatic gastric cancer w/ peritoneal carcinomatosis Paroxysmal a-fib Lichen planus Hypothyroidism Plan; -per primary   Best Practice (right click and "Reselect all SmartList Selections" daily)   Per primary  Labs   CBC:  Recent Labs  Lab 07/24/22 1200 07/25/22 0629 07/26/22 0500  WBC 9.2 8.5 6.2  NEUTROABS 6.5  --   --   HGB 10.2* 8.9* 8.9*  HCT 32.4* 27.4* 27.7*  MCV 89.5 89.0 89.6  PLT 182 164 186     Basic Metabolic Panel: Recent Labs  Lab 07/24/22 1200 07/25/22 0629  NA 138 136  K 4.0 3.7  CL 105 105  CO2 22 22  GLUCOSE 109* 99  BUN 16 12  CREATININE 1.08 0.93  CALCIUM 9.2 8.2*    GFR: Estimated Creatinine Clearance: 91.5  mL/min (by C-G formula based on SCr of 0.93 mg/dL). Recent Labs  Lab 07/24/22 1200 07/24/22 1455 07/25/22 0629 07/26/22 0500  WBC 9.2  --  8.5 6.2  LATICACIDVEN  --  1.7  --   --      Liver Function Tests: Recent Labs  Lab 07/24/22 1200  AST 16  ALT 9  ALKPHOS 76  BILITOT 0.7  PROT 6.8  ALBUMIN 3.2*    No results for input(s): "LIPASE", "AMYLASE" in the last 168 hours. No results for input(s): "AMMONIA" in the last 168 hours.  ABG    Component Value Date/Time   TCO2 22 04/12/2020 1034     Coagulation Profile: Recent Labs  Lab 07/24/22 1455  INR 1.3*     Cardiac Enzymes: No results for input(s): "CKTOTAL", "CKMB", "CKMBINDEX", "TROPONINI" in the last 168 hours.  HbA1C: No results found for: "HGBA1C"  CBG: No results for input(s): "GLUCAP" in the last 168 hours.       Richardson Landry Katriana Dortch ACNP Acute Care Nurse Practitioner [place] Please consult Amion 07/26/2022, 9:59 AM

## 2022-07-26 NOTE — Progress Notes (Signed)
IP PROGRESS NOTE  Subjective:   Mr. Fryar is no new complaint.  No bleeding. He denies dark or black stool. Objective: Vital signs in last 24 hours: Blood pressure 92/71, pulse (!) 127, temperature 97.8 F (36.6 C), temperature source Oral, resp. rate 19, height 6' (1.829 m), weight 255 lb (115.7 kg), SpO2 93 %.  Intake/Output from previous day: 03/19 0701 - 03/20 0700 In: [id number] [P.O.:824; I.V.:223] Out: 2276 [Urine:2275; Stool:1]  Physical Exam:  HEENT: No thrush or ulcers Lungs: Decreased breath sounds at the right lower posterior chest, no respiratory distress Cardiac: Regular rate and rhythm, tachycardia Abdomen: Soft and nontender, no mass Extremities: Slight enlargement of the right compared to the left lower leg   Portacath/PICC-without erythema  Lab Results: Recent Labs    07/25/22 0629 07/26/22 0500  WBC 8.5 6.2  HGB 8.9* 8.9*  HCT 27.4* 27.7*  PLT 164 186    BMET Recent Labs    07/24/22 1200 07/25/22 0629  NA 138 136  K 4.0 3.7  CL 105 105  CO2 22 22  GLUCOSE 109* 99  BUN 16 12  CREATININE 1.08 0.93  CALCIUM 9.2 8.2*    Lab Results  Component Value Date   CEA1 <0.6 04/13/2022    Studies/Results: VAS [place] LOWER EXTREMITY VENOUS (DVT)  Result Date: 07/25/2022  Lower Venous DVT Study Patient Name:  TRIEU KOREY  Date of Exam:   07/25/2022 Medical Rec #: [id number]        Accession #:    [id number] Date of Birth: [birth year]       Patient Gender: M Patient Age:   7 years Exam Location:  [facility] Procedure:      VAS [place] LOWER EXTREMITY VENOUS (DVT) Referring Phys: Roxanne Mins PATEL --------------------------------------------------------------------------------  Indications: Pulmonary embolism, and Chronic RLE swelling. Other Indications: On Eliquis for A-Fib, but had to stop anticoagulation due to                    GI bleed about 1-2 months ago. Risk Factors: Cancer gastric. Anticoagulation: Heparin. Comparison Study: No prior study. Performing  Technologist: McKayla Maag RVT, [place]  Examination Guidelines: A complete evaluation includes B-mode imaging, spectral Doppler, color Doppler, and power Doppler as needed of all accessible portions of each vessel. Bilateral testing is considered an integral part of a complete examination. Limited examinations for reoccurring indications may be performed as noted. The reflux portion of the exam is performed with the patient in reverse Trendelenburg.  +---------+---------------+---------+-----------+----------+--------------+ RIGHT    CompressibilityPhasicitySpontaneityPropertiesThrombus Aging +---------+---------------+---------+-----------+----------+--------------+ CFV      Full           Yes      Yes                                 +---------+---------------+---------+-----------+----------+--------------+ SFJ      Full                                                        +---------+---------------+---------+-----------+----------+--------------+ FV Prox  None           No       No                   Acute          +---------+---------------+---------+-----------+----------+--------------+  FV Mid   None           No       No                   Acute          +---------+---------------+---------+-----------+----------+--------------+ FV DistalNone           No       No                   Acute          +---------+---------------+---------+-----------+----------+--------------+ PFV      Full           Yes      Yes                                 +---------+---------------+---------+-----------+----------+--------------+ POP      None           No       No                   Acute          +---------+---------------+---------+-----------+----------+--------------+ PTV      None           No       No                   Acute          +---------+---------------+---------+-----------+----------+--------------+ PERO     None           No       No                    Acute          +---------+---------------+---------+-----------+----------+--------------+   +---------+---------------+---------+-----------+----------+--------------+ LEFT     CompressibilityPhasicitySpontaneityPropertiesThrombus Aging +---------+---------------+---------+-----------+----------+--------------+ CFV      Full           Yes      Yes                                 +---------+---------------+---------+-----------+----------+--------------+ SFJ      Full                                                        +---------+---------------+---------+-----------+----------+--------------+ FV Prox  Full                                                        +---------+---------------+---------+-----------+----------+--------------+ FV Mid   Full                                                        +---------+---------------+---------+-----------+----------+--------------+ FV DistalFull                                                        +---------+---------------+---------+-----------+----------+--------------+  PFV      Full                                                        +---------+---------------+---------+-----------+----------+--------------+ POP      Full           Yes      Yes                                 +---------+---------------+---------+-----------+----------+--------------+ PTV      None           No       No                   Acute          +---------+---------------+---------+-----------+----------+--------------+ PERO     Full                                                        +---------+---------------+---------+-----------+----------+--------------+     Summary: RIGHT: - Findings consistent with acute deep vein thrombosis involving the right femoral vein, right popliteal vein, right posterior tibial veins, and right peroneal veins. - No cystic structure found in the popliteal fossa.  LEFT: -  Findings consistent with acute deep vein thrombosis involving the left posterior tibial veins. - No cystic structure found in the popliteal fossa.  *See table(s) above for measurements and observations. Electronically signed by Deitra Mayo MD on 07/25/2022 at 4:41:44 PM.    Final    ECHOCARDIOGRAM COMPLETE  Result Date: 07/25/2022    ECHOCARDIOGRAM REPORT   Patient Name:   KENDREW PACI Date of Exam: 07/25/2022 Medical Rec #:  [id number]       Height:       72.0 in Accession #:    [id number]      Weight:       255.0 lb Date of Birth:  October 23, 1945      BSA:          2.362 m Patient Age:    15 years        BP:           110/68 mmHg Patient Gender: M               HR:           79 bpm. Exam Location:  Inpatient Procedure: 2D Echo, Cardiac Doppler and Color Doppler Indications:    Pulmonary Embolus I26.09  History:        Patient has prior history of Echocardiogram examinations, most                 recent 06/22/2022. Arrythmias:Atrial Fibrillation and                 Tachycardia, Signs/Symptoms:Shortness of Breath; Risk                 Factors:Sleep Apnea. Acute saddle pulmonary embolism with acute                 cor pulmonale.  Sonographer:    Ronny Flurry Referring Phys: [id number]  VISHAL R PATEL IMPRESSIONS  1. Left ventricular ejection fraction, by estimation, is 55 to 60%. The left ventricle has normal function. The left ventricle has no regional wall motion abnormalities. There is mild concentric left ventricular hypertrophy. Left ventricular diastolic parameters are consistent with Grade I diastolic dysfunction (impaired relaxation).  2. Right ventricular systolic function is normal. The right ventricular size is mildly enlarged. Tricuspid regurgitation signal is inadequate for assessing PA pressure.  3. Left atrial size was mildly dilated.  4. Right atrial size was moderately dilated.  5. The mitral valve is normal in structure. No evidence of mitral valve regurgitation. No evidence of mitral  stenosis.  6. The aortic valve is tricuspid. There is moderate calcification of the aortic valve. Aortic valve regurgitation is not visualized. Mild aortic valve stenosis. Aortic valve mean gradient measures 15.0 mmHg.  7. Aortic dilatation noted. There is mild dilatation of the aortic root, measuring 39 mm. There is mild dilatation of the ascending aorta, measuring 40 mm.  8. The inferior vena cava is normal in size with greater than 50% respiratory variability, suggesting right atrial pressure of 3 mmHg. FINDINGS  Left Ventricle: Left ventricular ejection fraction, by estimation, is 55 to 60%. The left ventricle has normal function. The left ventricle has no regional wall motion abnormalities. The left ventricular internal cavity size was normal in size. There is  mild concentric left ventricular hypertrophy. Left ventricular diastolic parameters are consistent with Grade I diastolic dysfunction (impaired relaxation). Right Ventricle: The right ventricular size is mildly enlarged. No increase in right ventricular wall thickness. Right ventricular systolic function is normal. Tricuspid regurgitation signal is inadequate for assessing PA pressure. Left Atrium: Left atrial size was mildly dilated. Right Atrium: Right atrial size was moderately dilated. Pericardium: There is no evidence of pericardial effusion. Mitral Valve: The mitral valve is normal in structure. There is mild calcification of the mitral valve leaflet(s). Mild mitral annular calcification. No evidence of mitral valve regurgitation. No evidence of mitral valve stenosis. Tricuspid Valve: The tricuspid valve is normal in structure. Tricuspid valve regurgitation is trivial. Aortic Valve: The aortic valve is tricuspid. There is moderate calcification of the aortic valve. Aortic valve regurgitation is not visualized. Mild aortic stenosis is present. Aortic valve mean gradient measures 15.0 mmHg. Aortic valve peak gradient measures 15.7 mmHg. Aortic valve  area, by VTI measures 2.00 cm. Pulmonic Valve: The pulmonic valve was normal in structure. Pulmonic valve regurgitation is not visualized. Aorta: Aortic dilatation noted. There is mild dilatation of the aortic root, measuring 39 mm. There is mild dilatation of the ascending aorta, measuring 40 mm. Venous: The inferior vena cava is normal in size with greater than 50% respiratory variability, suggesting right atrial pressure of 3 mmHg. IAS/Shunts: No atrial level shunt detected by color flow Doppler.  LEFT VENTRICLE PLAX 2D LVIDd:         4.10 cm   Diastology LVIDs:         2.70 cm   LV e' medial:    6.99 cm/s LV PW:         1.50 cm   LV E/e' medial:  8.2 LV IVS:        1.30 cm   LV e' lateral:   11.80 cm/s LVOT diam:     2.20 cm   LV E/e' lateral: 4.9 LV SV:         73 LV SV Index:   31 LVOT Area:     3.80 cm  RIGHT VENTRICLE  RV S prime:     18.50 cm/s TAPSE (M-mode): 2.6 cm LEFT ATRIUM           Index        RIGHT ATRIUM           Index LA diam:      4.20 cm 1.78 cm/m   RA Area:     38.90 cm LA Vol (A2C): 36.6 ml 15.49 ml/m  RA Volume:   160.00 ml 67.73 ml/m LA Vol (A4C): 53.0 ml 22.44 ml/m  AORTIC VALVE AV Area (Vmax):    1.98 cm AV Area (Vmean):   1.97 cm AV Area (VTI):     2.00 cm AV Vmax:           198.00 cm/s AV Vmean:          132.367 cm/s AV VTI:            0.366 m AV Peak Grad:      15.7 mmHg AV Mean Grad:      15.0 mmHg LVOT Vmax:         103.33 cm/s LVOT Vmean:        68.600 cm/s LVOT VTI:          0.193 m LVOT/AV VTI ratio: 0.53  AORTA Ao Root diam: 3.90 cm Ao Asc diam:  4.00 cm MITRAL VALVE MV Area (PHT): 2.69 cm    SHUNTS MV Decel Time: 282 msec    Systemic VTI:  0.19 m MV E velocity: 57.50 cm/s  Systemic Diam: 2.20 cm MV A velocity: 74.10 cm/s MV E/A ratio:  0.78 Dalton McleanMD Electronically signed by Franki Monte Signature Date/Time: 07/25/2022/1:01:31 PM    Final     Medications: I have reviewed the patient's current medications.  Assessment/Plan: Gastric cancer with CT evidence  of abdominal carcinomatosis CT abdomen/pelvis 04/13/2022-ascites, diffuse omental and peritoneal nodularity, possible circumferential mass in the transverse colon, new small left greater than right pleural effusions CT abdominal mass biopsy 04/24/2022-metastatic poorly differentiated adenocarcinoma with very focal signet ring cell features CT paracentesis 04/24/2022-no malignant cells identified Colonoscopy 04/25/2022-diverticulosis in the sigmoid colon.  Internal hemorrhoids.  No specimens collected. Upper endoscopy 04/25/2022-large infiltrative mass with oozing bleeding and stigmata of recent bleeding in the gastric fundus, on the anterior wall of the gastric body and on the greater curvature of the gastric body; deformity in the gastric antrum.  Extrinsic deformity in the entire duodenum.  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 Her2 by IHC negative, 1+; MMR IHC normal; PD-L1 CPS 1%, positive.  Cycle 1 FOLFOX 05/04/2022 Cycle 2 FOLFOX 05/17/2022 Cycle 3 FOLFOX 05/31/2022-oxaliplatin dose reduced secondary to prolonged cold sensitivity Cycle 4 FOLFOX 06/14/2022 Cycle 5 FOLFOX 06/28/2022 CT abdomen/pelvis 07/08/2022-asymmetric wall thickening at the lesser curvature of the stomach, decreased ascites, increased diffuse omental and peritoneal nodularity Cycle 6 FOLFOX 07/12/2022 Truncal rash-status post recent punch biopsy with pathology pending; biopsy reported negative per family [id number] Lichen planus of the feet Hypothyroidism Atrial fibrillation Admission 07/24/2022 with acute bilateral pulmonary embolism/right heart strain Heparin anticoagulation 07/24/2022 Doppler 07/24/2022-acute DVT of the right femoral, popliteal, posterior tibial, and peroneal veins, acute left posterior tibial DVT 7.  Anemia secondary to chemotherapy, phlebotomy, and hemoptysis-stable 8.  Tachycardia 07/26/2022    Mr. Meulemans is now at day 15 following cycle 6 FOLFOX given for  treatment of metastatic gastric cancer.  He is admitted with symptomatic acute pulmonary embolism.  Chest pain and hemoptysis have improved since hospital admission.  He has a history of atrial fibrillation.  He has developed tachycardia with a regular rhythm this morning.  I recommend continuing IV heparin anticoagulation until 07/26/2022.  There is no report of bleeding.  He can be converted to Lovenox when felt appropriate per pulmonary medicine.   The anemia is secondary to chemotherapy, hemoptysis, and phlebotomy.  His case was presented at the GI tumor conference last week.  The plan is to continue FOLFOX chemotherapy and obtain a restaging CT abdomen after cycle 9.  Recommendations: Continue heparin, convert to Lovenox-twice daily therapeutic dosing when recommended by pulmonary medicine Discontinue anticoagulation and refer for IVC filter placement if he has creased hemoptysis or GI bleeding while on anticoagulation Cancer with chemotherapy planned for 07/26/2022, cycle 7 FOLFOX will be scheduled for 08/09/2022 I will check on him over the next few days and outpatient follow-up will be scheduled at the Cancer center  LOS: 2 days   Betsy Coder, MD   07/26/2022, 2:56 PM

## 2022-07-26 NOTE — Progress Notes (Signed)
Rechecked on Mr. Krasner this afternoon. He is less SOB than this morning. HR remains in 120s, SBP  in 90s with MAP  in 70s.   Con't low dose IVF, encouraged to drink water. Can redose metoprolol if needed but since he is feeling improved and HR is somewhat improved, ok to monitor for now with HR in 120s. Family members at bedside this afternoon.  Julian Hy, DO 07/26/22 3:34 PM [facility]  For contact information, see Amion. If no response to pager, please call PCCM consult pager. After hours, 7PM- 7AM, please call Elink.

## 2022-07-26 NOTE — Progress Notes (Signed)
Trop dropping, BNP rising. Stop LR tonight. Can reassess volume status tomorrow since he should be volume replete.  Julian Hy, DO 07/26/22 7:36 PM [facility]  For contact information, see Amion. If no response to pager, please call PCCM consult pager. After hours, 7PM- 7AM, please call Elink.

## 2022-07-26 NOTE — Progress Notes (Signed)
Pt HR 130s to 140s sinus tach, sustained for 30 minutes,B/P 98/76,mild SOB O2 applied at 2 LPM/[place],saturation at 96%. Pt is alert and oriented .Dr C. [place] Crane made aware.Will continue to monitor.

## 2022-07-27 ENCOUNTER — Other Ambulatory Visit (HOSPITAL_COMMUNITY): Payer: Self-pay

## 2022-07-27 DIAGNOSIS — R000 Tachycardia, unspecified: Secondary | ICD-10-CM | POA: Diagnosis not present

## 2022-07-27 DIAGNOSIS — E440 Moderate protein-calorie malnutrition: Secondary | ICD-10-CM

## 2022-07-27 DIAGNOSIS — I480 Paroxysmal atrial fibrillation: Secondary | ICD-10-CM | POA: Diagnosis not present

## 2022-07-27 DIAGNOSIS — C168 Malignant neoplasm of overlapping sites of stomach: Secondary | ICD-10-CM | POA: Diagnosis not present

## 2022-07-27 DIAGNOSIS — I2609 Other pulmonary embolism with acute cor pulmonale: Secondary | ICD-10-CM | POA: Diagnosis not present

## 2022-07-27 LAB — CBC
HCT: 29.6 % — ABNORMAL LOW (ref 39.0–52.0)
Hemoglobin: 9.4 g/dL — ABNORMAL LOW (ref 13.0–17.0)
MCH: 28.7 pg (ref 26.0–34.0)
MCHC: 31.8 g/dL (ref 30.0–36.0)
MCV: 90.2 fL (ref 80.0–100.0)
Platelets: 238 10*3/uL (ref 150–400)
RBC: 3.28 MIL/uL — ABNORMAL LOW (ref 4.22–5.81)
RDW: 19.4 % — ABNORMAL HIGH (ref 11.5–15.5)
WBC: 7.1 10*3/uL (ref 4.0–10.5)
nRBC: 0 % (ref 0.0–0.2)

## 2022-07-27 LAB — HEPARIN LEVEL (UNFRACTIONATED): Heparin Unfractionated: 0.47 IU/mL (ref 0.30–0.70)

## 2022-07-27 MED ORDER — ENOXAPARIN SODIUM 120 MG/0.8ML IJ SOSY
120.0000 mg | PREFILLED_SYRINGE | Freq: Two times a day (BID) | INTRAMUSCULAR | Status: DC
  Administered 2022-07-27 – 2022-07-28 (×2): 120 mg via SUBCUTANEOUS
  Filled 2022-07-27 (×3): qty 0.8

## 2022-07-27 NOTE — TOC Benefit Eligibility Note (Signed)
Patient Teacher, English as a foreign language completed.    The patient is currently admitted and upon discharge could be taking enoxaparin (Lovenox) 120 mg/0.8 ml.  The current 30 day co-pay is $100.00.   The patient is insured through [employer] Part D   This test claim was processed through [place] amounts may vary at other pharmacies due to pharmacy/plan contracts, or as the patient moves through the different stages of their insurance plan.  Lyndel Safe, [place] Patient Advocate Specialist [facility] Patient Advocate Team Direct Number: [phone]  Fax: [phone]

## 2022-07-27 NOTE — Progress Notes (Signed)
PROGRESS NOTE  Todd Harrison [id number] DOB: Dec 04, 1945 DOA: 07/24/2022 PCP: Lorene Dy, MD   LOS: 3 days   Brief narrative:  Todd Harrison is a 77 y.o. male with medical history significant for metastatic gastric cancer with peritoneal carcinomatosis on FOLFOX, paroxysmal atrial fibrillation not on anticoagulation due to bleeding related to gastric cancer, asthmatic bronchitis, hypothyroidism, lichen planus, OSA not using CPAP presented to hospital with hemoptysis and right-sided chest pain and dyspnea for 1 week.  Patient initially presented to [facility] ED with events noted to have stable vitals.  Labs were within normal limits.  Chest x-ray showed right lung base opacity.  CTA of the chest showed a significant bilateral pulmonary emboli with large saddle embolus on the right and small saddle embolus on the left, submassive PE noted.  Patient was then admitted to the hospital with acute bilateral and saddle PE.  Pulmonary and medical oncology was consulted.  Assessment/Plan:  Principal Problem:   Acute saddle pulmonary embolism with acute cor pulmonale (HCC) Active Problems:   Paroxysmal atrial fibrillation (HCC)   Malignant neoplasm of overlapping sites of stomach (HCC)   Acquired hypothyroidism   Asthmatic bronchitis   OSA (obstructive sleep apnea)   Lichen planus   Malnutrition of moderate degree   Hemoptysis   Infarct of lung (HCC)   Pulmonary embolus (HCC)  Acute saddle pulmonary embolism  CT showed significant bilateral PE with large saddle embolus on the right and small saddle embolus on the left.  There was evidence of right heart strain and probable right pulmonary infarcts on CT imaging.  2D echocardiogram done on 07/25/2022 showed LV ejection fraction of 55 to 60% with no regional wall motion abnormality grade 1 diastolic dysfunction.  Venous duplex ultrasound shows bilateral lower extremity DVT.  Pulmonary was consulted due to large pulmonary embolism.  No  plans for thrombolytic treatment.  Currently on heparin drip.  Pulmonary has recommended transition to oral anticoagulation at this time but hematology recommending Lovenox..  On room air today.  Bilateral lower extremity DVT.  Patient has extensive DVT involving both the lower extremities mostly on the the right.  Oncology on board.  Continue on IV heparin and transition to Lovenox starting today.  History of metastatic gastric cancer with peritoneal carcinomatosis on FOLFOX regimen.  Follows up with Dr Learta Codding as outpatient and is on active treatment with FOLFOX regimen.  In the past, anticoagulation was discontinued due to previous history of bleeding.  Currently on heparin drip.  Will change to Lovenox today and closely monitor.  Tachycardia, history of paroxysmal atrial fibrillation  Was taken off Eliquis due to concern of bleeding from gastric cancer.  On IV heparin at this time.  Continue Toprol-XL.  Will closely monitor for bleeding.  Transition to Lovenox starting today.  Tachycardia has improved today.  Required 2 doses of IV metoprolol yesterday with some IV fluids.   Lichen planus Continue Plaquenil.   OSA (obstructive sleep apnea) Has not been using CPAP.   Asthmatic bronchitis Continue as needed albuterol.   Acquired hypothyroidism Continue Synthroid.   DVT prophylaxis: Heparin drip.  Code Status: Full code  Family Communication: Spoke with the patient's daughter at bedside on 07/26/2022.  Status is: Inpatient  Remains inpatient appropriate because: Massive pulmonary embolism, saddle embolism, extensive lower extremity DVT, on IV heparin drip.  Consultants: Hem-oncology Pulmonary  Procedures: None  Anti-infectives:  Hydroxychloroquine    Anti-infectives (From admission, onward)    Start     Dose/Rate Route Frequency  Ordered Stop   07/24/22 2230  hydroxychloroquine (PLAQUENIL) tablet 200 mg        200 mg Oral 2 times daily 07/24/22 2121     07/24/22 1315   cefTRIAXone (ROCEPHIN) 1 g in sodium chloride 0.9 % 100 mL IVPB        1 g 200 mL/hr over 30 Minutes Intravenous  Once 07/24/22 1311 07/24/22 1457   07/24/22 1315  azithromycin (ZITHROMAX) 500 mg in sodium chloride 0.9 % 250 mL IVPB        500 mg 250 mL/hr over 60 Minutes Intravenous  Once 07/24/22 1311 07/24/22 1552      Subjective:  Today, patient was seen and examined at bedside.  Denies any chest pain, shortness of breath, fever, chills or rigor.  Was little tachycardic yesterday and required some IV metoprolol.  Heart rate has improved today.  Objective: Vitals:   07/27/22 0831 07/27/22 1128  BP: (!) 127/58 96/65  Pulse:  67  Resp:  18  Temp:  97.6 F (36.4 C)  SpO2:      Intake/Output Summary (Last 24 hours) at 07/27/2022 1144 Last data filed at 07/27/2022 0641 Gross per 24 hour  Intake 1238.82 ml  Output 1150 ml  Net 88.82 ml    Filed Weights   07/24/22 1153  Weight: 115.7 kg   Body mass index is 34.58 kg/m.   Physical Exam:  GENERAL: Alert awake and oriented, not in obvious distress, obese.   HENT: No scleral pallor or icterus. Pupils equally reactive to light. Oral mucosa is moist NECK: is supple, no gross swelling noted. CHEST:  Diminished breath sounds bilaterally. CVS: S1 and S2 heard, no murmur. Regular rate and rhythm.  ABDOMEN: Soft, non-tender, bowel sounds are present. EXTREMITIES: No edema. CNS: Cranial nerves are intact. No focal motor deficits. SKIN: warm and dry without rashes.  Data Review: I have personally reviewed the following laboratory data and studies,  CBC: Recent Labs  Lab 07/24/22 1200 07/25/22 0629 07/26/22 0500 07/27/22 0500  WBC 9.2 8.5 6.2 7.1  NEUTROABS 6.5  --   --   --   HGB 10.2* 8.9* 8.9* 9.4*  HCT 32.4* 27.4* 27.7* 29.6*  MCV 89.5 89.0 89.6 90.2  PLT 182 164 186 [id number]    Basic Metabolic Panel: Recent Labs  Lab 07/24/22 1200 07/25/22 0629  NA 138 136  K 4.0 3.7  CL 105 105  CO2 22 22  GLUCOSE 109* 99   BUN 16 12  CREATININE 1.08 0.93  CALCIUM 9.2 8.2*    Liver Function Tests: Recent Labs  Lab 07/24/22 1200  AST 16  ALT 9  ALKPHOS 76  BILITOT 0.7  PROT 6.8  ALBUMIN 3.2*    No results for input(s): "LIPASE", "AMYLASE" in the last 168 hours. No results for input(s): "AMMONIA" in the last 168 hours. Cardiac Enzymes: No results for input(s): "CKTOTAL", "CKMB", "CKMBINDEX", "TROPONINI" in the last 168 hours. BNP (last 3 results) Recent Labs    07/24/22 1455 07/26/22 1458  BNP 296.1* 431.3*     ProBNP (last 3 results) No results for input(s): "PROBNP" in the last 8760 hours.  CBG: No results for input(s): "GLUCAP" in the last 168 hours. No results found for this or any previous visit (from the past 240 hour(s)).   Studies: No results found.    Flora Lipps, MD  [facility] 07/27/2022  If 7PM-7AM, please contact night-coverage

## 2022-07-27 NOTE — Progress Notes (Signed)
ANTICOAGULATION CONSULT NOTE  Pharmacy Consult for Heparin > enoxaparin  Indication: B/L DVT + pulmonary embolus,  Allergies  Allergen Reactions   Gold Itching   Mixed Grasses     unknown    Patient Measurements: Height: 6' (182.9 cm) Weight: 115.7 kg (255 lb) IBW/kg (Calculated) : 77.6 Heparin Dosing Weight: 102.6 kg  Vital Signs: Temp: 97.6 F (36.4 C) (03/21 1128) Temp Source: Oral (03/21 1128) BP: 96/65 (03/21 1128) Pulse Rate: 67 (03/21 1128)  Labs: Recent Labs    07/24/22 1455 07/24/22 2212 07/25/22 0629 07/25/22 1558 07/26/22 0500 07/26/22 1458 07/27/22 0500  HGB  --    < > 8.9*  --  8.9*  --  9.4*  HCT  --   --  27.4*  --  27.7*  --  29.6*  PLT  --   --  164  --  186  --  238  APTT 38*  --   --   --   --   --   --   LABPROT 15.7*  --   --   --   --   --   --   INR 1.3*  --   --   --   --   --   --   HEPARINUNFRC  --    < > 0.39 0.38 0.41  --  0.47  CREATININE  --   --  0.93  --   --   --   --   TROPONINIHS 7  --   --   --   --  9  --    < > = values in this interval not displayed.     Estimated Creatinine Clearance: 91.5 mL/min (by C-G formula based on SCr of 0.93 mg/dL).   Medical History: Past Medical History:  Diagnosis Date   A-fib ([place])    Asthma    Cataract    Gastric cancer (HCC)    GERD (gastroesophageal reflux disease)    Lichen planus    Methotrexate, long term, current use    no longer taking   Psoriasis    Sleep apnea    Thyroid disease    Urticaria    Assessment: 77 yo M with a history of AF no longer on eliquis secondary to bleeding from stomach cancer. Patient is not on anticoagulation prior to arrival. Patient is presenting with hemoptysis found to have PE with right heart strain. Heparin per pharmacy consult placed for pulmonary embolus. Pt also with extensive B/L DVTs.  Heparin level remains therapeutic (0.47) on heparin infusion at 1900 units/hr. Hgb low but stable, plt wnl. No bleeding noted. Convert to enoxaparin for  dc planning 1mg /kg = 120mg  q12h crcl > 70ml/min  Goal of Therapy:  Heparin level 0.6-1.2 drawn 4 hours after injection Monitor platelets by anticoagulation protocol: Yes   Plan:  Stop heparin drip  Enoxaparin 120mg  q12hr  Monitor s/s bleeding  Cost $100 for 30d supply per patient advocate price check   Bonnita Nasuti Pharm.D. CPP, BCPS Clinical Pharmacist [phone] 07/27/2022 12:38 PM

## 2022-07-27 NOTE — Care Management Important Message (Signed)
Important Message  Patient Details  Name: ORIE WORSTELL MRN: [id number] Date of Birth: 1945-08-04   Medicare Important Message Given:  Yes     Shelda Altes 07/27/2022, 9:19 AM

## 2022-07-27 NOTE — Progress Notes (Signed)
NAME:  Todd Harrison, MRN:  [id number], DOB:  1946-03-22, LOS: 3 ADMISSION DATE:  07/24/2022, CONSULTATION DATE:  3/19 REFERRING MD:  Dr. Louanne Belton, CHIEF COMPLAINT:  saddle PE   History of Present Illness:  Patient is a 77 year old male with pertinent PMH metastatic gastric cancer with peritoneal carcinomatosis on folfox, a-fib not on AC since 04/2022 due to bleeding related to gastric cancer, asthma, OSA not on CPAP presents to [place] ED on 3/18 with saddle PE.  Patient states he has been having some hemoptysis and right-sided chest pain with dyspnea for about 1 week.  On 3/18 went to [place] ED for further eval.  Vital stable and on room air.  CXR showing RLL opacity.  CTA chest showing large saddle embolus on right and small saddle embolus on left; submassive PE; RV/LV 1.19; right pleural effusion with probable right pulmonary infarct. Dvt US showing acute dvt involving left posterior tibial veins and acute dvt in right femoral, popliteal, posterial tibial and peroneal veins. Echo LVEF 0000000; grade I diastolic dysfx; moderate calcification of aortic valve w/ mild stenosis. LA 1.7, troponin 7, bnp 296. Started on heparin. PCCM consulted.  Pertinent  Medical History   Past Medical History:  Diagnosis Date   A-fib ([place])    Asthma    Cataract    Gastric cancer ([place])    GERD (gastroesophageal reflux disease)    Lichen planus    Methotrexate, long term, current use    no longer taking   Psoriasis    Sleep apnea    Thyroid disease    Urticaria      Significant Hospital Events: Including procedures, antibiotic start and stop dates in addition to other pertinent events   3/18 saddle pe 3/19 pccm consulted 07/26/2022 developed tachycardia  Interim History / Subjective:  Heart rate under 90 pulmonary critical care will sign off at this time   Objective   Blood pressure (!) 127/58, pulse 79, temperature (!) 97.4 F (36.3 C), temperature source Oral, resp. rate 17, height 6'  (1.829 m), weight 115.7 kg, SpO2 96 %.        Intake/Output Summary (Last 24 hours) at 07/27/2022 0950 Last data filed at 07/27/2022 [id number] Gross per 24 hour  Intake 1238.82 ml  Output 1400 ml  Net -161.18 ml   Filed Weights   07/24/22 1153  Weight: 115.7 kg    Examination: General: Obese male no acute distress HEENT: MM pink/moist no JVD Neuro: Intact CV: Heart sounds are regular PULM:    GI: soft, bsx4 active  GU: Aamber urine Extremities: warm/dry, 1+ edema  Skin: no rashes or lesions     Resolved Hospital Problem list     Assessment & Plan:  Acute saddle PE B/l DVT Plan: Continue heparin Transition to oral agent  OSA Plan: Noncompliant with CPAP  Asthma Plan: As needed albuterol  Tachycardia most likely A-fib rapid trickle response versus sinus tachycardia in the setting of dehydration and stress.  Questionable component of beta blocker withdrawal. Heart rate is less than 80 No further rehydration Pulmonary critical care will sign off at this time  Hx of metastatic gastric cancer w/ peritoneal carcinomatosis Paroxysmal a-fib Lichen planus Hypothyroidism Plan; -per primary   Best Practice (right click and "Reselect all SmartList Selections" daily)   Per primary  Labs   CBC: Recent Labs  Lab 07/24/22 1200 07/25/22 0629 07/26/22 0500 07/27/22 0500  WBC 9.2 8.5 6.2 7.1  NEUTROABS 6.5  --   --   --  HGB 10.2* 8.9* 8.9* 9.4*  HCT 32.4* 27.4* 27.7* 29.6*  MCV 89.5 89.0 89.6 90.2  PLT 182 164 186 [id number]    Basic Metabolic Panel: Recent Labs  Lab 07/24/22 1200 07/25/22 0629  NA 138 136  K 4.0 3.7  CL 105 105  CO2 22 22  GLUCOSE 109* 99  BUN 16 12  CREATININE 1.08 0.93  CALCIUM 9.2 8.2*   GFR: Estimated Creatinine Clearance: 91.5 mL/min (by C-G formula based on SCr of 0.93 mg/dL). Recent Labs  Lab 07/24/22 1200 07/24/22 1455 07/25/22 0629 07/26/22 0500 07/27/22 0500  WBC 9.2  --  8.5 6.2 7.1  LATICACIDVEN  --  1.7  --   --    --     Liver Function Tests: Recent Labs  Lab 07/24/22 1200  AST 16  ALT 9  ALKPHOS 76  BILITOT 0.7  PROT 6.8  ALBUMIN 3.2*   No results for input(s): "LIPASE", "AMYLASE" in the last 168 hours. No results for input(s): "AMMONIA" in the last 168 hours.  ABG    Component Value Date/Time   TCO2 22 04/12/2020 1034     Coagulation Profile: Recent Labs  Lab 07/24/22 1455  INR 1.3*    Cardiac Enzymes: No results for input(s): "CKTOTAL", "CKMB", "CKMBINDEX", "TROPONINI" in the last 168 hours.  HbA1C: No results found for: "HGBA1C"  CBG: No results for input(s): "GLUCAP" in the last 168 hours.       Richardson Landry Toshiro Hanken ACNP Acute Care Nurse Practitioner [place] Please consult Amion 07/27/2022, 9:50 AM

## 2022-07-27 NOTE — Progress Notes (Signed)
   07/27/22 1402  Mobility  Activity Ambulated with assistance in hallway  Level of Assistance Independent  Assistive Device None  Distance Ambulated (ft) 940 ft   Pt ambulated in the hall with RN, pt tolerated well HR remained WDL

## 2022-07-28 ENCOUNTER — Inpatient Hospital Stay: Payer: Medicare HMO

## 2022-07-28 DIAGNOSIS — C168 Malignant neoplasm of overlapping sites of stomach: Secondary | ICD-10-CM | POA: Diagnosis not present

## 2022-07-28 DIAGNOSIS — I480 Paroxysmal atrial fibrillation: Secondary | ICD-10-CM | POA: Diagnosis not present

## 2022-07-28 DIAGNOSIS — E039 Hypothyroidism, unspecified: Secondary | ICD-10-CM | POA: Diagnosis not present

## 2022-07-28 DIAGNOSIS — I2602 Saddle embolus of pulmonary artery with acute cor pulmonale: Secondary | ICD-10-CM | POA: Diagnosis not present

## 2022-07-28 LAB — BASIC METABOLIC PANEL
Anion gap: 7 (ref 5–15)
BUN: 10 mg/dL (ref 8–23)
CO2: 23 mmol/L (ref 22–32)
Calcium: 8.5 mg/dL — ABNORMAL LOW (ref 8.9–10.3)
Chloride: 106 mmol/L (ref 98–111)
Creatinine, Ser: 0.96 mg/dL (ref 0.61–1.24)
GFR, Estimated: >60 mL/min (ref >60)
Glucose, Bld: 96 mg/dL (ref 70–99)
Potassium: 3.8 mmol/L (ref 3.5–5.1)
Sodium: 136 mmol/L (ref 135–145)

## 2022-07-28 LAB — CBC
HCT: 28.8 % — ABNORMAL LOW (ref 39.0–52.0)
Hemoglobin: 9.2 g/dL — ABNORMAL LOW (ref 13.0–17.0)
MCH: 28.6 pg (ref 26.0–34.0)
MCHC: 31.9 g/dL (ref 30.0–36.0)
MCV: 89.4 fL (ref 80.0–100.0)
Platelets: 228 10*3/uL (ref 150–400)
RBC: 3.22 MIL/uL — ABNORMAL LOW (ref 4.22–5.81)
RDW: 19 % — ABNORMAL HIGH (ref 11.5–15.5)
WBC: 5.9 10*3/uL (ref 4.0–10.5)
nRBC: 0 % (ref 0.0–0.2)

## 2022-07-28 LAB — MAGNESIUM: Magnesium: 2.2 mg/dL (ref 1.7–2.4)

## 2022-07-28 MED ORDER — ENOXAPARIN SODIUM 120 MG/0.8ML IJ SOSY: 120.0000 mg | PREFILLED_SYRINGE | Freq: Two times a day (BID) | INTRAMUSCULAR | 2 refills | Status: DC

## 2022-07-28 MED ORDER — HEPARIN SOD (PORK) LOCK FLUSH 100 UNIT/ML IV SOLN
500.0000 [IU] | INTRAVENOUS | Status: AC | PRN
  Administered 2022-07-28: 500 [IU]

## 2022-07-28 NOTE — Discharge Instructions (Signed)
Information on my medicine - LOVENOX (enoxaparin)  Why was lovenox prescribed for you? Lovenox was prescribed to treat blood clots that may have been found in the veins of your legs (deep vein thrombosis) or in your lungs (pulmonary embolism) and to reduce the risk of them occurring again.  What do You need to know about Lovenox? Lovenox  is given by a shot under the skin by you or a care provider. Lovenox is injected into the right or left side of your abdomen at least 2 inches away from your belly button. Clean the injection site with an alcohol swab and let dry. Give only the amount prescribed to you by your healthcare provider and do not change the dose unless instructed by your health care provider.  Try to administer the dose(s) about the same time every day. To minimize bruising and irritation, rotate where the shots are given.  Store Lovenox at room temperature, away from heat and direct light. Do not refrigerate or freeze the syringes. Keep away from children or pets.  Take Lovenox exactly as prescribed and DO NOT stop taking Lovenox without talking to the doctor who prescribed the medication.  Stopping may increase your risk of developing a new blood clot.    After discharge, you should have regular appointments with your healthcare provider.  Dispose of used syringe and cap in a sharps disposal container once used (if sharps disposal container unavailable, use any hard plastic jug that can be securely closed).     What do you do if you miss a dose? If a dose of Lovenox is not administered at the scheduled time, administer it as soon as possible on the same day and then resume regular administration schedule. The dose should not be doubled to make up for a missed dose.  Important Safety Information A possible side effect of Lovenox is bleeding. You should call your healthcare provider right away if you experience any of the following: Bleeding from an injury or your nose  that does not stop. Unusual colored urine (red or dark brown) or unusual colored stools (red or black). Unusual bruising for unknown reasons. A serious fall or if you hit your head (even if there is no bleeding).  Some medicines may interact with Lovenox and might increase your risk of bleeding or clotting while on Lovenox. To help avoid this, consult your healthcare provider or pharmacist prior to using any new prescription or non-prescription medications, including herbals, vitamins, non-steroidal anti-inflammatory drugs (NSAIDs) and supplements.  This website has more information on Lovenox (enoxaparin): www.Lovenox.com.  

## 2022-07-28 NOTE — TOC Transition Note (Signed)
Transition of Care (TOC) - CM/SW Discharge Note Marvetta Gibbons RN, BSN Transitions of Care Unit 4E- RN Case Manager See Treatment Team for direct phone #   Patient Details  Name: Todd Harrison MRN: [id number] Date of Birth: [birth year]  Transition of [facility]) CM/SW Contact:  [name], RN Phone Number: 07/28/2022, 2:24 PM   Clinical Narrative:    Pt stable for transition home today, family to transport home. Pt has been started on Lovenox injections- per benefits check- copay $100/30 day supply.   Bedside RN to provide Lovenox education for self administration.   No further TOC needs noted. Pt to follow up as per AVS instructions.    Final next level of care: Home/Self Care Barriers to Discharge: Barriers Resolved   Patient Goals and CMS Choice   Choice offered to / list presented to : NA  Discharge Placement               Home          Discharge Plan and Services Additional resources added to the After Visit Summary for   In-house Referral: NA Discharge Planning Services: NA Post Acute Care Choice: NA          DME Arranged: N/A DME Agency: NA       HH Arranged: NA HH Agency: NA        Social Determinants of Health (SDOH) Interventions SDOH Screenings   Food Insecurity: No Food Insecurity (04/17/2022)  Housing: Low Risk  (04/17/2022)  Transportation Needs: No Transportation Needs (04/17/2022)  Utilities: Not At Risk (04/17/2022)  Depression (PHQ2-9): Low Risk  (11/21/2019)  Financial Resource Strain: Low Risk  (04/17/2022)  Stress: No Stress Concern Present (04/17/2022)  Tobacco Use: Medium Risk (07/24/2022)     Readmission Risk Interventions    07/28/2022    2:24 PM  Readmission Risk Prevention Plan  Transportation Screening Complete  PCP or Specialist Appt within 5-7 Days Complete  Home Care Screening Complete  Medication Review (RN CM) Complete

## 2022-07-28 NOTE — Discharge Summary (Signed)
Physician Discharge Summary  Todd Harrison [id number] DOB: 1945-10-18 DOA: 07/24/2022  PCP: Lorene Dy, MD  Admit date: 07/24/2022 Discharge date: 07/28/2022  Admitted From: Home  Discharge disposition: Home   Recommendations for Outpatient Follow-Up:   Follow up with your primary care provider in one week.  Check CBC, BMP, magnesium in the next visit Follow-up with Dr Learta Codding oncology in 1 to 2 weeks or as scheduled by the clinic. Patient has been started on Lovenox subcu twice a day and will need closer monitoring of hemoglobin as outpatient for bleeding.  Discharge Diagnosis:   Principal Problem:   Acute saddle pulmonary embolism with acute cor pulmonale (HCC) Active Problems:   Paroxysmal atrial fibrillation (HCC)   Malignant neoplasm of overlapping sites of stomach (HCC)   Acquired hypothyroidism   Asthmatic bronchitis   OSA (obstructive sleep apnea)   Lichen planus   Malnutrition of moderate degree   Hemoptysis   Infarct of lung ([place])   Pulmonary embolus ([place])   Discharge Condition: Improved.  Diet recommendation: Low sodium, heart healthy.    Wound care: None.  Code status: Full.   History of Present Illness:   Todd Harrison is a 77 y.o. male with medical history significant for metastatic gastric cancer with peritoneal carcinomatosis on FOLFOX, paroxysmal atrial fibrillation not on anticoagulation due to bleeding related to gastric cancer, asthmatic bronchitis, hypothyroidism, lichen planus, OSA not using CPAP presented to hospital with hemoptysis and right-sided chest pain and dyspnea for 1 week.  Patient initially presented to [facility] ED with events noted to have stable vitals.  Labs were within normal limits.  Chest x-ray showed right lung base opacity.  CTA of the chest showed a significant bilateral pulmonary emboli with large saddle embolus on the right and small saddle embolus on the left, submassive PE noted.  Patient was then admitted  to the hospital with acute bilateral and saddle PE.  Pulmonary and medical oncology was consulted.   Hospital Course:   Following conditions were addressed during hospitalization as listed below,  Acute saddle pulmonary embolism  CT showed significant bilateral PE with large saddle embolus on the right and small saddle embolus on the left.  There was evidence of right heart strain and probable right pulmonary infarcts on CT imaging.  2D echocardiogram done on 07/25/2022 showed LV ejection fraction of 55 to 60% with no regional wall motion abnormality grade 1 diastolic dysfunction.  Venous duplex ultrasound shows bilateral lower extremity DVT.  Pulmonary was consulted due to large pulmonary embolism.  No plans for thrombolytic treatment.  Patient was initially on heparin drip.  At this time patient has been transitioned to Lovenox as per oncology recommendation.  Further episodes of hemoptysis or any external bleeding.  Hemoglobin has remained stable at 9.2 from 9.4.  Patient was counseled on being on Lovenox and precautions blood thinner including risk  Bilateral lower extremity DVT.  Patient has extensive DVT involving both the lower extremities mostly on the the right.  Oncology on board.  Patient will continue Lovenox on discharge.  History of metastatic gastric cancer with peritoneal carcinomatosis on FOLFOX regimen.  Follows up with Dr Learta Codding as outpatient and is on active treatment with FOLFOX regimen.  In the past, anticoagulation was discontinued due to previous history of bleeding.  Currently on Lovenox.  Will closely need to follow-up with oncology as outpatient.  Tachycardia, history of paroxysmal atrial fibrillation  Was taken off Eliquis due to concern of bleeding from gastric cancer. This  time.  Continue Toprol-XL.    Tachycardia has improved    Lichen planus Continue Plaquenil.   OSA (obstructive sleep apnea) Has not been using CPAP.   Asthmatic bronchitis Continue as needed  albuterol.   Acquired hypothyroidism Continue Synthroid.  Disposition.  At this time, patient is stable for disposition with outpatient PCP and oncology follow-up.Marland Kitchen  Spoke with the patient's daughter at bedside regarding the plan for disposition and follow-up.  Medical Consultants:   PCCM Oncology  Procedures:    None Subjective:   Today, patient was seen and examined at bedside.  Patient denies any chest pain, shortness of breath, dyspnea.  He was able to ambulate.  Denies any GI bleed or hemoptysis.  Patient's daughter at bedside.  Discharge Exam:   Vitals:   07/28/22 0300 07/28/22 0720  BP: 128/80 114/63  Pulse: 83 71  Resp: 15   Temp: 98 F (36.7 C) 98.7 F (37.1 C)  SpO2: 100% 94%   Vitals:   07/27/22 2027 07/27/22 2341 07/28/22 0300 07/28/22 0720  BP: 123/75 120/72 128/80 114/63  Pulse: 76 80 83 71  Resp: 18 16 15    Temp: 97.9 F (36.6 C) 98 F (36.7 C) 98 F (36.7 C) 98.7 F (37.1 C)  TempSrc: Oral Oral Oral Oral  SpO2: 97% 94% 100% 94%  Weight:      Height:       Body mass index is 34.58 kg/m.   General: Alert awake, not in obvious distress, obese built HENT: pupils equally reacting to light,  No scleral pallor or icterus noted. Oral mucosa is moist.  Chest:  Clear breath sounds.  Diminished breath sounds bilaterally. No crackles or wheezes.  CVS: S1 &S2 heard. No murmur.  Regular rate and rhythm. Abdomen: Soft, nontender, nondistended.  Bowel sounds are heard.   Extremities: No cyanosis, clubbing or edema.  Peripheral pulses are palpable. Psych: Alert, awake and oriented, normal mood CNS:  No cranial nerve deficits.  Power equal in all extremities.   Skin: Warm and dry.  No rashes noted.  The results of significant diagnostics from this hospitalization (including imaging, microbiology, ancillary and laboratory) are listed below for reference.     Diagnostic Studies:   VAS [place] LOWER EXTREMITY VENOUS (DVT)  Result Date: 07/25/2022  Lower Venous  DVT Study Patient Name:  Todd Harrison  Date of Exam:   07/25/2022 Medical Rec #: [id number]        Accession #:    [id number] Date of Birth: 01-Nov-1945       Patient Gender: M Patient Age:   77 years Exam Location:  [facility] Procedure:      VAS [place] LOWER EXTREMITY VENOUS (DVT) Referring Phys: Roxanne Mins PATEL --------------------------------------------------------------------------------  Indications: Pulmonary embolism, and Chronic RLE swelling. Other Indications: On Eliquis for A-Fib, but had to stop anticoagulation due to                    GI bleed about 1-2 months ago. Risk Factors: Cancer gastric. Anticoagulation: Heparin. Comparison Study: No prior study. Performing Technologist: McKayla Maag RVT, [place]  Examination Guidelines: A complete evaluation includes B-mode imaging, spectral Doppler, color Doppler, and power Doppler as needed of all accessible portions of each vessel. Bilateral testing is considered an integral part of a complete examination. Limited examinations for reoccurring indications may be performed as noted. The reflux portion of the exam is performed with the patient in reverse Trendelenburg.  +---------+---------------+---------+-----------+----------+--------------+ RIGHT    CompressibilityPhasicitySpontaneityPropertiesThrombus Aging +---------+---------------+---------+-----------+----------+--------------+  CFV      Full           Yes      Yes                                 +---------+---------------+---------+-----------+----------+--------------+ SFJ      Full                                                        +---------+---------------+---------+-----------+----------+--------------+ FV Prox  None           No       No                   Acute          +---------+---------------+---------+-----------+----------+--------------+ FV Mid   None           No       No                   Acute           +---------+---------------+---------+-----------+----------+--------------+ FV DistalNone           No       No                   Acute          +---------+---------------+---------+-----------+----------+--------------+ PFV      Full           Yes      Yes                                 +---------+---------------+---------+-----------+----------+--------------+ POP      None           No       No                   Acute          +---------+---------------+---------+-----------+----------+--------------+ PTV      None           No       No                   Acute          +---------+---------------+---------+-----------+----------+--------------+ PERO     None           No       No                   Acute          +---------+---------------+---------+-----------+----------+--------------+   +---------+---------------+---------+-----------+----------+--------------+ LEFT     CompressibilityPhasicitySpontaneityPropertiesThrombus Aging +---------+---------------+---------+-----------+----------+--------------+ CFV      Full           Yes      Yes                                 +---------+---------------+---------+-----------+----------+--------------+ SFJ      Full                                                        +---------+---------------+---------+-----------+----------+--------------+  FV Prox  Full                                                        +---------+---------------+---------+-----------+----------+--------------+ FV Mid   Full                                                        +---------+---------------+---------+-----------+----------+--------------+ FV DistalFull                                                        +---------+---------------+---------+-----------+----------+--------------+ PFV      Full                                                         +---------+---------------+---------+-----------+----------+--------------+ POP      Full           Yes      Yes                                 +---------+---------------+---------+-----------+----------+--------------+ PTV      None           No       No                   Acute          +---------+---------------+---------+-----------+----------+--------------+ PERO     Full                                                        +---------+---------------+---------+-----------+----------+--------------+     Summary: RIGHT: - Findings consistent with acute deep vein thrombosis involving the right femoral vein, right popliteal vein, right posterior tibial veins, and right peroneal veins. - No cystic structure found in the popliteal fossa.  LEFT: - Findings consistent with acute deep vein thrombosis involving the left posterior tibial veins. - No cystic structure found in the popliteal fossa.  *See table(s) above for measurements and observations. Electronically signed by Deitra Mayo MD on 07/25/2022 at 4:41:44 PM.    Final    ECHOCARDIOGRAM COMPLETE  Result Date: 07/25/2022    ECHOCARDIOGRAM REPORT   Patient Name:   Todd Harrison Date of Exam: 07/25/2022 Medical Rec #:  [id number]       Height:       72.0 in Accession #:    [id number]      Weight:       255.0 lb Date of Birth:  [birth year]      BSA:          2.362 m Patient Age:    89 years  BP:           110/68 mmHg Patient Gender: M               HR:           79 bpm. Exam Location:  Inpatient Procedure: 2D Echo, Cardiac Doppler and Color Doppler Indications:    Pulmonary Embolus I26.09  History:        Patient has prior history of Echocardiogram examinations, most                 recent 06/22/2022. Arrythmias:Atrial Fibrillation and                 Tachycardia, Signs/Symptoms:Shortness of Breath; Risk                 Factors:Sleep Apnea. Acute saddle pulmonary embolism with acute                 cor pulmonale.  Sonographer:     Ronny Flurry Referring Phys: [id number] [place]  1. Left ventricular ejection fraction, by estimation, is 55 to 60%. The left ventricle has normal function. The left ventricle has no regional wall motion abnormalities. There is mild concentric left ventricular hypertrophy. Left ventricular diastolic parameters are consistent with Grade I diastolic dysfunction (impaired relaxation).  2. Right ventricular systolic function is normal. The right ventricular size is mildly enlarged. Tricuspid regurgitation signal is inadequate for assessing PA pressure.  3. Left atrial size was mildly dilated.  4. Right atrial size was moderately dilated.  5. The mitral valve is normal in structure. No evidence of mitral valve regurgitation. No evidence of mitral stenosis.  6. The aortic valve is tricuspid. There is moderate calcification of the aortic valve. Aortic valve regurgitation is not visualized. Mild aortic valve stenosis. Aortic valve mean gradient measures 15.0 mmHg.  7. Aortic dilatation noted. There is mild dilatation of the aortic root, measuring 39 mm. There is mild dilatation of the ascending aorta, measuring 40 mm.  8. The inferior vena cava is normal in size with greater than 50% respiratory variability, suggesting right atrial pressure of 3 mmHg. FINDINGS  Left Ventricle: Left ventricular ejection fraction, by estimation, is 55 to 60%. The left ventricle has normal function. The left ventricle has no regional wall motion abnormalities. The left ventricular internal cavity size was normal in size. There is  mild concentric left ventricular hypertrophy. Left ventricular diastolic parameters are consistent with Grade I diastolic dysfunction (impaired relaxation). Right Ventricle: The right ventricular size is mildly enlarged. No increase in right ventricular wall thickness. Right ventricular systolic function is normal. Tricuspid regurgitation signal is inadequate for assessing PA pressure. Left  Atrium: Left atrial size was mildly dilated. Right Atrium: Right atrial size was moderately dilated. Pericardium: There is no evidence of pericardial effusion. Mitral Valve: The mitral valve is normal in structure. There is mild calcification of the mitral valve leaflet(s). Mild mitral annular calcification. No evidence of mitral valve regurgitation. No evidence of mitral valve stenosis. Tricuspid Valve: The tricuspid valve is normal in structure. Tricuspid valve regurgitation is trivial. Aortic Valve: The aortic valve is tricuspid. There is moderate calcification of the aortic valve. Aortic valve regurgitation is not visualized. Mild aortic stenosis is present. Aortic valve mean gradient measures 15.0 mmHg. Aortic valve peak gradient measures 15.7 mmHg. Aortic valve area, by VTI measures 2.00 cm. Pulmonic Valve: The pulmonic valve was normal in structure. Pulmonic valve regurgitation is not visualized. Aorta: Aortic dilatation noted. There is  mild dilatation of the aortic root, measuring 39 mm. There is mild dilatation of the ascending aorta, measuring 40 mm. Venous: The inferior vena cava is normal in size with greater than 50% respiratory variability, suggesting right atrial pressure of 3 mmHg. IAS/Shunts: No atrial level shunt detected by color flow Doppler.  LEFT VENTRICLE PLAX 2D LVIDd:         4.10 cm   Diastology LVIDs:         2.70 cm   LV e' medial:    6.99 cm/s LV PW:         1.50 cm   LV E/e' medial:  8.2 LV IVS:        1.30 cm   LV e' lateral:   11.80 cm/s LVOT diam:     2.20 cm   LV E/e' lateral: 4.9 LV SV:         73 LV SV Index:   31 LVOT Area:     3.80 cm  RIGHT VENTRICLE RV S prime:     18.50 cm/s TAPSE (M-mode): 2.6 cm LEFT ATRIUM           Index        RIGHT ATRIUM           Index LA diam:      4.20 cm 1.78 cm/m   RA Area:     38.90 cm LA Vol (A2C): 36.6 ml 15.49 ml/m  RA Volume:   160.00 ml 67.73 ml/m LA Vol (A4C): 53.0 ml 22.44 ml/m  AORTIC VALVE AV Area (Vmax):    1.98 cm AV Area  (Vmean):   1.97 cm AV Area (VTI):     2.00 cm AV Vmax:           198.00 cm/s AV Vmean:          132.367 cm/s AV VTI:            0.366 m AV Peak Grad:      15.7 mmHg AV Mean Grad:      15.0 mmHg LVOT Vmax:         103.33 cm/s LVOT Vmean:        68.600 cm/s LVOT VTI:          0.193 m LVOT/AV VTI ratio: 0.53  AORTA Ao Root diam: 3.90 cm Ao Asc diam:  4.00 cm MITRAL VALVE MV Area (PHT): 2.69 cm    SHUNTS MV Decel Time: 282 msec    Systemic VTI:  0.19 m MV E velocity: 57.50 cm/s  Systemic Diam: 2.20 cm MV A velocity: 74.10 cm/s MV E/A ratio:  0.78 Dalton McleanMD Electronically signed by Franki Monte Signature Date/Time: 07/25/2022/1:01:31 PM    Final    CT Angio Chest PE W and/or Wo Contrast  Result Date: 07/24/2022 CLINICAL DATA:  Shortness of breath for 1 week and hemoptysis. History of gastric cancer. EXAM: CT ANGIOGRAPHY CHEST WITH CONTRAST TECHNIQUE: Multidetector CT imaging of the chest was performed using the standard protocol during bolus administration of intravenous contrast. Multiplanar CT image reconstructions and MIPs were obtained to evaluate the vascular anatomy. RADIATION DOSE REDUCTION: This exam was performed according to the departmental dose-optimization program which includes automated exposure control, adjustment of the mA and/or kV according to patient size and/or use of iterative reconstruction technique. CONTRAST:  77mL OMNIPAQUE IOHEXOL 350 MG/ML SOLN COMPARISON:  05/03/2022 FINDINGS: Cardiovascular: The heart is normal in size. No pericardial effusion. There is tortuosity and calcification of the thoracic aorta but no  dissection. The branch vessels are patent. Three-vessel coronary artery calcifications are noted. Significant bilateral pulmonary emboli with large saddle embolus on the right and small saddle embolus on the left. Evidence of right heart strain with flattening of the left ventricle. The RV LV ratio is 1.19. Mediastinum/Nodes: No mediastinal or hilar mass or adenopathy.  Small scattered lymph nodes are stable. The esophagus is unremarkable. Lungs/Pleura: Right pleural effusion and peripheral areas of probable pulmonary infarcts. No worrisome pulmonary lesions. No pneumothorax. Upper Abdomen: Upper abdominal peritoneal carcinomatosis related to patient's known gastric cancer. Vascular calcifications are noted. Musculoskeletal: No significant bony findings. Review of the MIP images confirms the above findings. IMPRESSION: 1. Positive for acute PE with CT evidence of right heart strain (RV/LV Ratio = 1.19) consistent with at least submassive (intermediate risk) PE. The presence of right heart strain has been associated with an increased risk of morbidity and mortality. Please refer to the "Code PE Focused" order set in EPIC. 2. Right pleural effusion and probable right pulmonary infarcts. 3. Peritoneal carcinomatosis. Aortic Atherosclerosis (ICD10-I70.0). These results were called by telephone at the time of interpretation on 07/24/2022 at 2:37 pm to provider [facility] , who verbally acknowledged these results. Electronically Signed   By: Marijo Sanes M.D.   On: 07/24/2022 14:39   DG Chest 2 View  Result Date: 07/24/2022 CLINICAL DATA:  Hemoptysis EXAM: CHEST - 2 VIEW COMPARISON:  06/23/2022 FINDINGS: Hyperinflation. Right upper chest port with tip along the central SVC above the right atrium, unchanged from prior. No pneumothorax, edema. Normal cardiopericardial silhouette. Developing infiltrate in the right lung base. Trace pleural effusions. IMPRESSION: Developing right lung base opacity. Possible acute infiltrate. Recommend follow-up. Hyperinflation.  Chest port.  Trace pleural effusions. Electronically Signed   By: Jill Side M.D.   On: 07/24/2022 12:41     Labs:   Basic Metabolic Panel: Recent Labs  Lab 07/24/22 1200 07/25/22 0629 07/28/22 0435  NA 138 136 136  K 4.0 3.7 3.8  CL 105 105 106  CO2 22 22 23   GLUCOSE 109* 99 96  BUN 16 12 10    CREATININE 1.08 0.93 0.96  CALCIUM 9.2 8.2* 8.5*  MG  --   --  2.2   GFR Estimated Creatinine Clearance: 88.6 mL/min (by C-G formula based on SCr of 0.96 mg/dL). Liver Function Tests: Recent Labs  Lab 07/24/22 1200  AST 16  ALT 9  ALKPHOS 76  BILITOT 0.7  PROT 6.8  ALBUMIN 3.2*   No results for input(s): "LIPASE", "AMYLASE" in the last 168 hours. No results for input(s): "AMMONIA" in the last 168 hours. Coagulation profile Recent Labs  Lab 07/24/22 1455  INR 1.3*    CBC: Recent Labs  Lab 07/24/22 1200 07/25/22 0629 07/26/22 0500 07/27/22 0500 07/28/22 0435  WBC 9.2 8.5 6.2 7.1 5.9  NEUTROABS 6.5  --   --   --   --   HGB 10.2* 8.9* 8.9* 9.4* 9.2*  HCT 32.4* 27.4* 27.7* 29.6* 28.8*  MCV 89.5 89.0 89.6 90.2 89.4  PLT 182 164 186 238 228   Cardiac Enzymes: No results for input(s): "CKTOTAL", "CKMB", "CKMBINDEX", "TROPONINI" in the last 168 hours. BNP: Invalid input(s): "POCBNP" CBG: No results for input(s): "GLUCAP" in the last 168 hours. D-Dimer No results for input(s): "DDIMER" in the last 72 hours. Hgb A1c No results for input(s): "HGBA1C" in the last 72 hours. Lipid Profile No results for input(s): "CHOL", "HDL", "LDLCALC", "TRIG", "CHOLHDL", "LDLDIRECT" in the last 72 hours. Thyroid  function studies No results for input(s): "TSH", "T4TOTAL", "T3FREE", "THYROIDAB" in the last 72 hours.  Invalid input(s): "FREET3" Anemia work up No results for input(s): "VITAMINB12", "FOLATE", "FERRITIN", "TIBC", "IRON", "RETICCTPCT" in the last 72 hours. Microbiology No results found for this or any previous visit (from the past 240 hour(s)).   Discharge Instructions:   Discharge Instructions     Call MD for:  difficulty breathing, headache or visual disturbances   Complete by: As directed    Call MD for:  severe uncontrolled pain   Complete by: As directed    Call MD for:  temperature >100.4   Complete by: As directed    Diet general   Complete by: As  directed    Discharge instructions   Complete by: As directed    Follow-up with your primary care provider in 1 week.  Check blood work at that time.  Closely watch for bleeding.  Start blood thinner if you notice any bleeding and seek medical attention.  Follow-up with pulmonary as has been scheduled and with oncology as scheduled by you/clinic.  Seek medical attention for worsening symptoms.   Increase activity slowly   Complete by: As directed       Allergies as of 07/28/2022       Reactions   Gold Itching   Mixed Grasses    unknown        Medication List     TAKE these medications    albuterol 108 (90 Base) MCG/ACT inhaler Commonly known as: VENTOLIN HFA Inhale 2 puffs into the lungs every 6 (six) hours as needed for wheezing or shortness of breath.   enoxaparin 120 MG/0.8ML injection Commonly known as: LOVENOX Inject 0.8 mLs (120 mg total) into the skin every 12 (twelve) hours.   fluticasone 50 MCG/ACT nasal spray Commonly known as: FLONASE Place 1 spray into both nostrils daily as needed for allergies or rhinitis.   fluticasone furoate-vilanterol 100-25 MCG/ACT Aepb Commonly known as: Breo Ellipta Inhale 1 puff into the lungs daily.   hydroxychloroquine 200 MG tablet Commonly known as: PLAQUENIL Take 200 mg by mouth 2 (two) times daily.   ISOtretinoin 40 MG capsule Commonly known as: ACCUTANE Take 40 mg by mouth 2 (two) times daily.   Klor-Con M20 20 MEQ tablet Generic drug: potassium chloride SA TAKE 1 TABLET BY MOUTH EVERY DAY   levothyroxine 112 MCG tablet Commonly known as: SYNTHROID Take 112 mcg by mouth daily before breakfast.   lidocaine-prilocaine cream Commonly known as: EMLA Apply 1 Application topically as needed (Apply 1 hour before coming to [place] for accessing).   metoprolol succinate 50 MG 24 hr tablet Commonly known as: TOPROL-XL TAKE 1 TABLET BY MOUTH EVERY DAY   omeprazole 40 MG capsule Commonly known as: PRILOSEC Take  40 mg by mouth daily.   ondansetron 8 MG tablet Commonly known as: ZOFRAN Take 1 tablet (8 mg total) by mouth every 8 (eight) hours as needed for nausea or vomiting.   polyethylene glycol 17 g packet Commonly known as: MIRALAX / GLYCOLAX Take 17 g by mouth daily.   prochlorperazine 10 MG tablet Commonly known as: COMPAZINE Take 1 tablet (10 mg total) by mouth every 6 (six) hours as needed for nausea or vomiting.   traMADol 50 MG tablet Commonly known as: ULTRAM Take 1 tablet (50 mg total) by mouth every 6 (six) hours as needed. What changed: reasons to take this   triamcinolone cream 0.1 % Commonly known as: KENALOG Apply 1  application  topically daily as needed (lichen planus flare).        Follow-up Information     [facility]. Go on 09/01/2022.   Specialty: Pulmonology Why: follow up with Dr. Valeta Harms at [place] [facility] at [facility] on 4/26 at 11:30. Contact information: [phone] [address] [id number] [phone]        Lorene Dy, MD Follow up in 1 week(s).   Specialty: Internal Medicine Contact information: [place] [place] [address] [phone]         Ladell Pier, MD Follow up in 1 week(s).   Specialty: Oncology Contact information: [place] [address] [phone]                  Time coordinating discharge: 39 minutes  Signed:  Tamberly Pomplun  [facility] 07/28/2022, 2:10 PM

## 2022-07-28 NOTE — Progress Notes (Signed)
PT Cancellation Note  Patient Details Name: BRANDDON HALLIWELL MRN: [id number] DOB: 03-19-1946   Cancelled Treatment:    Reason Eval/Treat Not Completed: PT screened, no needs identified, will sign off (pt walking independently in room, in hall with nursing and declined need for therapy at this time. Will sign off. please reorder as needed)   Vanette Noguchi B Olliver Boyadjian 07/28/2022, 11:15 AM [place] Office: [phone]

## 2022-08-06 ENCOUNTER — Other Ambulatory Visit: Payer: Self-pay | Admitting: Oncology

## 2022-08-06 DIAGNOSIS — C168 Malignant neoplasm of overlapping sites of stomach: Secondary | ICD-10-CM

## 2022-08-09 ENCOUNTER — Encounter: Payer: Self-pay | Admitting: *Deleted

## 2022-08-09 ENCOUNTER — Inpatient Hospital Stay: Payer: Medicare HMO | Attending: Nurse Practitioner

## 2022-08-09 ENCOUNTER — Inpatient Hospital Stay: Payer: Medicare HMO

## 2022-08-09 ENCOUNTER — Inpatient Hospital Stay: Payer: Medicare HMO | Admitting: Oncology

## 2022-08-09 VITALS — BP 119/74 | HR 82 | Temp 98.2°F | Resp 18 | Ht 72.0 in | Wt 248.2 lb

## 2022-08-09 VITALS — BP 99/60 | HR 71

## 2022-08-09 DIAGNOSIS — E039 Hypothyroidism, unspecified: Secondary | ICD-10-CM | POA: Diagnosis not present

## 2022-08-09 DIAGNOSIS — D6481 Anemia due to antineoplastic chemotherapy: Secondary | ICD-10-CM | POA: Insufficient documentation

## 2022-08-09 DIAGNOSIS — Z5111 Encounter for antineoplastic chemotherapy: Secondary | ICD-10-CM | POA: Insufficient documentation

## 2022-08-09 DIAGNOSIS — C169 Malignant neoplasm of stomach, unspecified: Secondary | ICD-10-CM | POA: Diagnosis not present

## 2022-08-09 DIAGNOSIS — C168 Malignant neoplasm of overlapping sites of stomach: Secondary | ICD-10-CM

## 2022-08-09 DIAGNOSIS — L439 Lichen planus, unspecified: Secondary | ICD-10-CM | POA: Diagnosis not present

## 2022-08-09 DIAGNOSIS — Z86718 Personal history of other venous thrombosis and embolism: Secondary | ICD-10-CM | POA: Diagnosis not present

## 2022-08-09 DIAGNOSIS — Z86711 Personal history of pulmonary embolism: Secondary | ICD-10-CM | POA: Insufficient documentation

## 2022-08-09 DIAGNOSIS — I4891 Unspecified atrial fibrillation: Secondary | ICD-10-CM | POA: Diagnosis not present

## 2022-08-09 DIAGNOSIS — C786 Secondary malignant neoplasm of retroperitoneum and peritoneum: Secondary | ICD-10-CM | POA: Diagnosis not present

## 2022-08-09 LAB — CBC WITH DIFFERENTIAL (CANCER CENTER ONLY)
Abs Immature Granulocytes: 0.15 10*3/uL — ABNORMAL HIGH (ref 0.00–0.07)
Basophils Absolute: 0.1 10*3/uL (ref 0.0–0.1)
Basophils Relative: 1 %
Eosinophils Absolute: 0 10*3/uL (ref 0.0–0.5)
Eosinophils Relative: 0 %
HCT: 36.6 % — ABNORMAL LOW (ref 39.0–52.0)
Hemoglobin: 11.7 g/dL — ABNORMAL LOW (ref 13.0–17.0)
Immature Granulocytes: 1 %
Lymphocytes Relative: 27 %
Lymphs Abs: 3.4 10*3/uL (ref 0.7–4.0)
MCH: 29.5 pg (ref 26.0–34.0)
MCHC: 32 g/dL (ref 30.0–36.0)
MCV: 92.4 fL (ref 80.0–100.0)
Monocytes Absolute: 0.9 10*3/uL (ref 0.1–1.0)
Monocytes Relative: 7 %
Neutro Abs: 8 10*3/uL — ABNORMAL HIGH (ref 1.7–7.7)
Neutrophils Relative %: 64 %
Platelet Count: 240 10*3/uL (ref 150–400)
RBC: 3.96 MIL/uL — ABNORMAL LOW (ref 4.22–5.81)
RDW: 18.5 % — ABNORMAL HIGH (ref 11.5–15.5)
WBC Count: 12.6 10*3/uL — ABNORMAL HIGH (ref 4.0–10.5)
nRBC: 0 % (ref 0.0–0.2)

## 2022-08-09 LAB — CMP (CANCER CENTER ONLY)
ALT: 14 U/L (ref 0–44)
AST: 15 U/L (ref 15–41)
Albumin: 3.6 g/dL (ref 3.5–5.0)
Alkaline Phosphatase: 54 U/L (ref 38–126)
Anion gap: 10 (ref 5–15)
BUN: 18 mg/dL (ref 8–23)
CO2: 25 mmol/L (ref 22–32)
Calcium: 9.4 mg/dL (ref 8.9–10.3)
Chloride: 104 mmol/L (ref 98–111)
Creatinine: 0.84 mg/dL (ref 0.61–1.24)
GFR, Estimated: >60 mL/min (ref >60)
Glucose, Bld: 101 mg/dL — ABNORMAL HIGH (ref 70–99)
Potassium: 3.5 mmol/L (ref 3.5–5.1)
Sodium: 139 mmol/L (ref 135–145)
Total Bilirubin: 0.4 mg/dL (ref 0.3–1.2)
Total Protein: 6.4 g/dL — ABNORMAL LOW (ref 6.5–8.1)

## 2022-08-09 MED ORDER — OXALIPLATIN CHEMO INJECTION 100 MG/20ML
65.0000 mg/m2 | Freq: Once | INTRAVENOUS | Status: AC
  Administered 2022-08-09: 150 mg via INTRAVENOUS
  Filled 2022-08-09: qty 20

## 2022-08-09 MED ORDER — DEXTROSE 5 % IV SOLN: Freq: Once | INTRAVENOUS | Status: AC

## 2022-08-09 MED ORDER — SODIUM CHLORIDE 0.9 % IV SOLN
2000.0000 mg/m2 | INTRAVENOUS | Status: DC
  Administered 2022-08-09: 5000 mg via INTRAVENOUS
  Filled 2022-08-09: qty 100

## 2022-08-09 MED ORDER — PALONOSETRON HCL INJECTION 0.25 MG/5ML
0.2500 mg | Freq: Once | INTRAVENOUS | Status: AC
  Administered 2022-08-09: 0.25 mg via INTRAVENOUS
  Filled 2022-08-09: qty 5

## 2022-08-09 MED ORDER — FLUOROURACIL CHEMO INJECTION 2.5 GM/50ML
400.0000 mg/m2 | Freq: Once | INTRAVENOUS | Status: AC
  Administered 2022-08-09: 1000 mg via INTRAVENOUS
  Filled 2022-08-09: qty 20

## 2022-08-09 MED ORDER — LEUCOVORIN CALCIUM INJECTION 350 MG
400.0000 mg/m2 | Freq: Once | INTRAVENOUS | Status: AC
  Administered 2022-08-09: 956 mg via INTRAVENOUS
  Filled 2022-08-09: qty 47.8

## 2022-08-09 MED ORDER — SODIUM CHLORIDE 0.9 % IV SOLN
10.0000 mg | Freq: Once | INTRAVENOUS | Status: AC
  Administered 2022-08-09: 10 mg via INTRAVENOUS
  Filled 2022-08-09: qty 10

## 2022-08-09 NOTE — Progress Notes (Signed)
[facility] OFFICE PROGRESS NOTE   Diagnosis: Gastric cancer  INTERVAL HISTORY:   Todd Harrison was discharged from the hospital 07/28/2022 after admission with a DVT and pulmonary embolism.  No further hemoptysis.  He continues Lovenox anticoagulation.  He is eating.  No abdominal pain.  No neuropathy symptoms.  Objective:  Vital signs in last 24 hours:  Blood pressure 119/74, pulse 82, temperature 98.2 F (36.8 C), temperature source Oral, resp. rate 18, height 6' (1.829 m), weight 248 lb 3.2 oz (112.6 kg), SpO2 98 %.    HEENT: No thrush, small ulcerations and ecchymoses at the right buccal mucosa Resp: Lungs clear bilaterally Cardio: Regular rate and rhythm GI: No hepatosplenomegaly, mildly distended Vascular: No leg edema  Skin: Ecchymoses at the abdominal wall Neurologic: Mild loss of vibratory sense at the fingertips bilaterally  Portacath/PICC-without erythema  Lab Results:  Lab Results  Component Value Date   WBC 12.6 (H) 08/09/2022   HGB 11.7 (L) 08/09/2022   HCT 36.6 (L) 08/09/2022   MCV 92.4 08/09/2022   PLT 240 08/09/2022   NEUTROABS 8.0 (H) 08/09/2022    CMP  Lab Results  Component Value Date   NA 139 08/09/2022   K 3.5 08/09/2022   CL 104 08/09/2022   CO2 25 08/09/2022   GLUCOSE 101 (H) 08/09/2022   BUN 18 08/09/2022   CREATININE 0.84 08/09/2022   CALCIUM 9.4 08/09/2022   PROT 6.4 (L) 08/09/2022   ALBUMIN 3.6 08/09/2022   AST 15 08/09/2022   ALT 14 08/09/2022   ALKPHOS 54 08/09/2022   BILITOT 0.4 08/09/2022   GFRNONAA >60 08/09/2022   GFRAA 74 05/20/2020    Lab Results  Component Value Date   CEA1 <0.6 04/13/2022   Medications: I have reviewed the patient's current medications.   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  Internal hemorrhoids.  No specimens collected. Upper endoscopy 04/25/2022-large infiltrative mass with oozing bleeding and stigmata of recent bleeding in the gastric fundus, on the anterior wall of the gastric body and on the greater curvature of the gastric body; deformity in the gastric antrum.  Extrinsic deformity in the entire duodenum.  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 Her2 by IHC negative, 1+; MMR IHC normal; PD-L1 CPS 1%, positive.  Cycle 1 FOLFOX 05/04/2022 Cycle 2 FOLFOX 05/17/2022 Cycle 3 FOLFOX 05/31/2022-oxaliplatin dose reduced secondary to prolonged cold sensitivity Cycle 4 FOLFOX 06/14/2022 Cycle 5 FOLFOX 06/28/2022 CT abdomen/pelvis 07/08/2022-asymmetric wall thickening at the lesser curvature of the stomach, decreased ascites, increased diffuse omental and peritoneal nodularity Cycle 6 FOLFOX 07/12/2022 Cycle 7 FOLFOX 08/09/2022 Truncal rash-status post recent punch biopsy with pathology pending; biopsy reported negative per family [id number] Lichen planus of the feet Hypothyroidism Atrial fibrillation Admission 07/24/2022 with acute bilateral pulmonary embolism/right heart strain Heparin anticoagulation 07/24/2022 Doppler 07/24/2022-acute DVT of the right femoral, popliteal, posterior tibial, and peroneal veins, acute left posterior tibial DVT 7.  Anemia secondary to chemotherapy, phlebotomy, and hemoptysis-improved      Disposition: Todd Harrison appears stable.  He has recovered from the hospital admission with acute pulmonary embolism.  He continues Lovenox anticoagulation.  He will complete cycle 7 FOLFOX today.  There is no clinical evidence for progression of the gastric  cancer.  Todd Harrison will return for an office visit and chemotherapy in 2 weeks.  Betsy Coder, MD  08/09/2022  9:09 AM

## 2022-08-09 NOTE — Progress Notes (Signed)
Patient seen by Dr. Benay Spice today  Vitals are within treatment parameters.MD is aware of weight loss  Labs reviewed by Dr. Benay Spice and are within treatment parameters.  Per physician team, patient is ready for treatment and there are NO modifications to the treatment plan.

## 2022-08-09 NOTE — Patient Instructions (Addendum)
[place]   The chemotherapy medication bag should finish at 46 hours, 96 hours, or 7 days. For example, if your pump is scheduled for 46 hours and it was put on at 4:00 p.m., it should finish at 2:00 p.m. the day it is scheduled to come off regardless of your appointment time.     Estimated time to finish at 11:45 Friday, August 11, 2022.   If the display on your pump reads "Low Volume" and it is beeping, take the batteries out of the pump and come to the cancer center for it to be taken off.   If the pump alarms go off prior to the pump reading "Low Volume" then call [phone] and someone can assist you.  If the plunger comes out and the chemotherapy medication is leaking out, please use your home chemo spill kit to clean up the spill. Do NOT use paper towels or other household products.  If you have problems or questions regarding your pump, please call either [phone] (24 hours a day) or the cancer center Monday-Friday 8:00 a.m.- 4:30 p.m. at the clinic number and we will assist you. If you are unable to get assistance, then go to the nearest Emergency Department and ask the staff to contact the IV team for assistance.  Discharge Instructions: Thank you for choosing [facility] to provide your oncology and hematology care.   If you have a lab appointment with the [facility], please go directly to the [facility] and check in at the registration area.   Wear comfortable clothing and clothing appropriate for easy access to any Portacath or PICC line.   We strive to give you quality time with your provider. You may need to reschedule your appointment if you arrive late (15 or more minutes).  Arriving late affects you and other patients whose appointments are after yours.  Also, if you miss three or more appointments without notifying the office, you may be dismissed from the clinic at the provider's discretion.      For  prescription refill requests, have your pharmacy contact our office and allow 72 hours for refills to be completed.    Today you received the following chemotherapy and/or immunotherapy agents Oxaliplatin, Leucovorin, Fluorouracil.      To help prevent nausea and vomiting after your treatment, we encourage you to take your nausea medication as directed.  BELOW ARE SYMPTOMS THAT SHOULD BE REPORTED IMMEDIATELY: *FEVER GREATER THAN 100.4 F (38 C) OR HIGHER *CHILLS OR SWEATING *NAUSEA AND VOMITING THAT IS NOT CONTROLLED WITH YOUR NAUSEA MEDICATION *UNUSUAL SHORTNESS OF BREATH *UNUSUAL BRUISING OR BLEEDING *URINARY PROBLEMS (pain or burning when urinating, or frequent urination) *BOWEL PROBLEMS (unusual diarrhea, constipation, pain near the anus) TENDERNESS IN MOUTH AND THROAT WITH OR WITHOUT PRESENCE OF ULCERS (sore throat, sores in mouth, or a toothache) UNUSUAL RASH, SWELLING OR PAIN  UNUSUAL VAGINAL DISCHARGE OR ITCHING   Items with * indicate a potential emergency and should be followed up as soon as possible or go to the Emergency Department if any problems should occur.  Please show the CHEMOTHERAPY ALERT CARD or IMMUNOTHERAPY ALERT CARD at check-in to the Emergency Department and triage nurse.  Should you have questions after your visit or need to cancel or reschedule your appointment, please contact [facility]  Dept: [phone]  and follow the prompts.  Office hours are 8:00 a.m. to 4:30 p.m. Monday - Friday. Please note  that voicemails left after 4:00 p.m. may not be returned until the following business day.  We are closed weekends and major holidays. You have access to a nurse at all times for urgent questions. Please call the main number to the clinic Dept: [phone] and follow the prompts.   For any non-urgent questions, you may also contact your provider using MyChart. We now offer e-Visits for anyone 43 and older to request care online  for non-urgent symptoms. For details visit [email or web address].   Also download the MyChart app! Go to the app store, search "MyChart", open the app, select [facility], and log in with your MyChart username and password.  Oxaliplatin Injection What is this medication? OXALIPLATIN (ox AL i PLA tin) treats colorectal cancer. It works by slowing down the growth of cancer cells. This medicine may be used for other purposes; ask your health care provider or pharmacist if you have questions. COMMON BRAND NAME(S): Eloxatin What should I tell my care team before I take this medication? They need to know if you have any of these conditions: Heart disease History of irregular heartbeat or rhythm Liver disease Low blood cell levels (white cells, red cells, and platelets) Lung or breathing disease, such as asthma Take medications that treat or prevent blood clots Tingling of the fingers, toes, or other nerve disorder An unusual or allergic reaction to oxaliplatin, other medications, foods, dyes, or preservatives If you or your partner are pregnant or trying to get pregnant Breast-feeding How should I use this medication? This medication is injected into a vein. It is given by your care team in a hospital or clinic setting. Talk to your care team about the use of this medication in children. Special care may be needed. Overdosage: If you think you have taken too much of this medicine contact a poison control center or emergency room at once. NOTE: This medicine is only for you. Do not share this medicine with others. What if I miss a dose? Keep appointments for follow-up doses. It is important not to miss a dose. Call your care team if you are unable to keep an appointment. What may interact with this medication? Do not take this medication with any of the following: Cisapride Dronedarone Pimozide Thioridazine This medication may also interact with the following: Aspirin and aspirin-like  medications Certain medications that treat or prevent blood clots, such as warfarin, apixaban, dabigatran, and rivaroxaban Cisplatin Cyclosporine Diuretics Medications for infection, such as acyclovir, adefovir, amphotericin B, bacitracin, cidofovir, foscarnet, ganciclovir, gentamicin, pentamidine, vancomycin NSAIDs, medications for pain and inflammation, such as ibuprofen or naproxen Other medications that cause heart rhythm changes Pamidronate Zoledronic acid This list may not describe all possible interactions. Give your health care provider a list of all the medicines, herbs, non-prescription drugs, or dietary supplements you use. Also tell them if you smoke, drink alcohol, or use illegal drugs. Some items may interact with your medicine. What should I watch for while using this medication? Your condition will be monitored carefully while you are receiving this medication. You may need blood work while taking this medication. This medication may make you feel generally unwell. This is not uncommon as chemotherapy can affect healthy cells as well as cancer cells. Report any side effects. Continue your course of treatment even though you feel ill unless your care team tells you to stop. This medication may increase your risk of getting an infection. Call your care team for advice if you get a fever, chills, sore throat, or  other symptoms of a cold or flu. Do not treat yourself. Try to avoid being around people who are sick. Avoid taking medications that contain aspirin, acetaminophen, ibuprofen, naproxen, or ketoprofen unless instructed by your care team. These medications may hide a fever. Be careful brushing or flossing your teeth or using a toothpick because you may get an infection or bleed more easily. If you have any dental work done, tell your dentist you are receiving this medication. This medication can make you more sensitive to cold. Do not drink cold drinks or use ice. Cover exposed  skin before coming in contact with cold temperatures or cold objects. When out in cold weather wear warm clothing and cover your mouth and nose to warm the air that goes into your lungs. Tell your care team if you get sensitive to the cold. Talk to your care team if you or your partner are pregnant or think either of you might be pregnant. This medication can cause serious birth defects if taken during pregnancy and for 9 months after the last dose. A negative pregnancy test is required before starting this medication. A reliable form of contraception is recommended while taking this medication and for 9 months after the last dose. Talk to your care team about effective forms of contraception. Do not father a child while taking this medication and for 6 months after the last dose. Use a condom while having sex during this time period. Do not breastfeed while taking this medication and for 3 months after the last dose. This medication may cause infertility. Talk to your care team if you are concerned about your fertility. What side effects may I notice from receiving this medication? Side effects that you should report to your care team as soon as possible: Allergic reactions--skin rash, itching, hives, swelling of the face, lips, tongue, or throat Bleeding--bloody or black, tar-like stools, vomiting blood or brown material that looks like coffee grounds, red or dark brown urine, small red or purple spots on skin, unusual bruising or bleeding Dry cough, shortness of breath or trouble breathing Heart rhythm changes--fast or irregular heartbeat, dizziness, feeling faint or lightheaded, chest pain, trouble breathing Infection--fever, chills, cough, sore throat, wounds that don't heal, pain or trouble when passing urine, general feeling of discomfort or being unwell Liver injury--right upper belly pain, loss of appetite, nausea, light-colored stool, dark yellow or brown urine, yellowing skin or eyes, unusual  weakness or fatigue Low red blood cell level--unusual weakness or fatigue, dizziness, headache, trouble breathing Muscle injury--unusual weakness or fatigue, muscle pain, dark yellow or brown urine, decrease in amount of urine Pain, tingling, or numbness in the hands or feet Sudden and severe headache, confusion, change in vision, seizures, which may be signs of posterior reversible encephalopathy syndrome (PRES) Unusual bruising or bleeding Side effects that usually do not require medical attention (report to your care team if they continue or are bothersome): Diarrhea Nausea Pain, redness, or swelling with sores inside the mouth or throat Unusual weakness or fatigue Vomiting This list may not describe all possible side effects. Call your doctor for medical advice about side effects. You may report side effects to FDA at 1-800-FDA-1088. Where should I keep my medication? This medication is given in a hospital or clinic. It will not be stored at home. NOTE: This sheet is a summary. It may not cover all possible information. If you have questions about this medicine, talk to your doctor, pharmacist, or health care provider.  2023 Elsevier/Gold Standard (  2007-06-15 00:00:00)  Leucovorin Injection What is this medication? LEUCOVORIN (loo koe VOR in) prevents side effects from certain medications, such as methotrexate. It works by increasing folate levels. This helps protect healthy cells in your body. It may also be used to treat anemia caused by low levels of folate. It can also be used with fluorouracil, a type of chemotherapy, to treat colorectal cancer. It works by increasing the effects of fluorouracil in the body. This medicine may be used for other purposes; ask your health care provider or pharmacist if you have questions. What should I tell my care team before I take this medication? They need to know if you have any of these conditions: Anemia from low levels of vitamin B12 in the  blood An unusual or allergic reaction to leucovorin, folic acid, other medications, foods, dyes, or preservatives Pregnant or trying to get pregnant Breastfeeding How should I use this medication? This medication is injected into a vein or a muscle. It is given by your care team in a hospital or clinic setting. Talk to your care team about the use of this medication in children. Special care may be needed. Overdosage: If you think you have taken too much of this medicine contact a poison control center or emergency room at once. NOTE: This medicine is only for you. Do not share this medicine with others. What if I miss a dose? Keep appointments for follow-up doses. It is important not to miss your dose. Call your care team if you are unable to keep an appointment. What may interact with this medication? Capecitabine Fluorouracil Phenobarbital Phenytoin Primidone Trimethoprim;sulfamethoxazole This list may not describe all possible interactions. Give your health care provider a list of all the medicines, herbs, non-prescription drugs, or dietary supplements you use. Also tell them if you smoke, drink alcohol, or use illegal drugs. Some items may interact with your medicine. What should I watch for while using this medication? Your condition will be monitored carefully while you are receiving this medication. This medication may increase the side effects of 5-fluorouracil. Tell your care team if you have diarrhea or mouth sores that do not get better or that get worse. What side effects may I notice from receiving this medication? Side effects that you should report to your care team as soon as possible: Allergic reactions--skin rash, itching, hives, swelling of the face, lips, tongue, or throat This list may not describe all possible side effects. Call your doctor for medical advice about side effects. You may report side effects to FDA at 1-800-FDA-1088. Where should I keep my  medication? This medication is given in a hospital or clinic. It will not be stored at home. NOTE: This sheet is a summary. It may not cover all possible information. If you have questions about this medicine, talk to your doctor, pharmacist, or health care provider.  2023 Elsevier/Gold Standard (2021-09-02 00:00:00) Fluorouracil Injection What is this medication? FLUOROURACIL (flure oh YOOR a sil) treats some types of cancer. It works by slowing down the growth of cancer cells. This medicine may be used for other purposes; ask your health care provider or pharmacist if you have questions. COMMON BRAND NAME(S): Adrucil What should I tell my care team before I take this medication? They need to know if you have any of these conditions: Blood disorders Dihydropyrimidine dehydrogenase (DPD) deficiency Infection, such as chickenpox, cold sores, herpes Kidney disease Liver disease Poor nutrition Recent or ongoing radiation therapy An unusual or allergic reaction to fluorouracil,  other medications, foods, dyes, or preservatives If you or your partner are pregnant or trying to get pregnant Breast-feeding How should I use this medication? This medication is injected into a vein. It is administered by your care team in a hospital or clinic setting. Talk to your care team about the use of this medication in children. Special care may be needed. Overdosage: If you think you have taken too much of this medicine contact a poison control center or emergency room at once. NOTE: This medicine is only for you. Do not share this medicine with others. What if I miss a dose? Keep appointments for follow-up doses. It is important not to miss your dose. Call your care team if you are unable to keep an appointment. What may interact with this medication? Do not take this medication with any of the following: Live virus vaccines This medication may also interact with the following: Medications that treat or  prevent blood clots, such as warfarin, enoxaparin, dalteparin This list may not describe all possible interactions. Give your health care provider a list of all the medicines, herbs, non-prescription drugs, or dietary supplements you use. Also tell them if you smoke, drink alcohol, or use illegal drugs. Some items may interact with your medicine. What should I watch for while using this medication? Your condition will be monitored carefully while you are receiving this medication. This medication may make you feel generally unwell. This is not uncommon as chemotherapy can affect healthy cells as well as cancer cells. Report any side effects. Continue your course of treatment even though you feel ill unless your care team tells you to stop. In some cases, you may be given additional medications to help with side effects. Follow all directions for their use. This medication may increase your risk of getting an infection. Call your care team for advice if you get a fever, chills, sore throat, or other symptoms of a cold or flu. Do not treat yourself. Try to avoid being around people who are sick. This medication may increase your risk to bruise or bleed. Call your care team if you notice any unusual bleeding. Be careful brushing or flossing your teeth or using a toothpick because you may get an infection or bleed more easily. If you have any dental work done, tell your dentist you are receiving this medication. Avoid taking medications that contain aspirin, acetaminophen, ibuprofen, naproxen, or ketoprofen unless instructed by your care team. These medications may hide a fever. Do not treat diarrhea with over the counter products. Contact your care team if you have diarrhea that lasts more than 2 days or if it is severe and watery. This medication can make you more sensitive to the sun. Keep out of the sun. If you cannot avoid being in the sun, wear protective clothing and sunscreen. Do not use sun lamps,  tanning beds, or tanning booths. Talk to your care team if you or your partner wish to become pregnant or think you might be pregnant. This medication can cause serious birth defects if taken during pregnancy and for 3 months after the last dose. A reliable form of contraception is recommended while taking this medication and for 3 months after the last dose. Talk to your care team about effective forms of contraception. Do not father a child while taking this medication and for 3 months after the last dose. Use a condom while having sex during this time period. Do not breastfeed while taking this medication. This medication may  cause infertility. Talk to your care team if you are concerned about your fertility. What side effects may I notice from receiving this medication? Side effects that you should report to your care team as soon as possible: Allergic reactions--skin rash, itching, hives, swelling of the face, lips, tongue, or throat Heart attack--pain or tightness in the chest, shoulders, arms, or jaw, nausea, shortness of breath, cold or clammy skin, feeling faint or lightheaded Heart failure--shortness of breath, swelling of the ankles, feet, or hands, sudden weight gain, unusual weakness or fatigue Heart rhythm changes--fast or irregular heartbeat, dizziness, feeling faint or lightheaded, chest pain, trouble breathing High ammonia level--unusual weakness or fatigue, confusion, loss of appetite, nausea, vomiting, seizures Infection--fever, chills, cough, sore throat, wounds that don't heal, pain or trouble when passing urine, general feeling of discomfort or being unwell Low red blood cell level--unusual weakness or fatigue, dizziness, headache, trouble breathing Pain, tingling, or numbness in the hands or feet, muscle weakness, change in vision, confusion or trouble speaking, loss of balance or coordination, trouble walking, seizures Redness, swelling, and blistering of the skin over hands and  feet Severe or prolonged diarrhea Unusual bruising or bleeding Side effects that usually do not require medical attention (report to your care team if they continue or are bothersome): Dry skin Headache Increased tears Nausea Pain, redness, or swelling with sores inside the mouth or throat Sensitivity to light Vomiting This list may not describe all possible side effects. Call your doctor for medical advice about side effects. You may report side effects to FDA at 1-800-FDA-1088. Where should I keep my medication? This medication is given in a hospital or clinic. It will not be stored at home. NOTE: This sheet is a summary. It may not cover all possible information. If you have questions about this medicine, talk to your doctor, pharmacist, or health care provider.  2023 Elsevier/Gold Standard (2021-08-23 00:00:00)

## 2022-08-11 ENCOUNTER — Inpatient Hospital Stay: Payer: Medicare HMO

## 2022-08-11 DIAGNOSIS — C168 Malignant neoplasm of overlapping sites of stomach: Secondary | ICD-10-CM

## 2022-08-14 NOTE — Progress Notes (Unsigned)
Cardiology Office Note:    Date:  08/14/2022   ID:  Todd Harrison, DOB 16-Nov-1945, MRN [id number]  PCP:  Burton Apley, MD  [facility] Cardiologist:  Meriam Sprague, MD  [facility] Electrophysiologist:  Lanier Prude, MD   Referring MD: Burton Apley, MD    History of Present Illness:    Todd Harrison is a 77 y.o. male with a hx of lichen planus on methotrexate and chronic prednisone, hypothyroidism, and newly diagnosed Afib with failed DCCV x2 (initial return to NSR on first DCCV but reverted back to Afib) who presents to clinic for follow-up.   Patient was diagnosed with left sided pneumonia and acute asthma exacerbation in 01/2020. He was treated with medrol dose pack and levaquin with modest improvement. He continued to have a productive cough and was following up with his pulmonologist when he was noted to be in Afib with RVR to 130s for which he was transferred to the ED.   During his hospitalization in September 2021, the patient was rate controlled initially with a dilt gtt later transitioned to cardizem and bystolic. He was initiated on eliquis for anticoagulation with plans to DCCV once he received 3 weeks of AC.   He returned for DCCV on 10/18 where he initially converted to NSR but rapidly returned back into Afib. At our follow-up visit on 03/11/20, he remained symptomatic in Afib. We loaded him with amiodarone and re-attempted DCCV on 12/06 but without success. He now returns to clinic for follow-up.  Was seen by Dr. Lalla Brothers on 04/2021 where he was doing well without recurrence of Afib. Amiodarone was stopped.   Was seen in clinic on 05/2022. Had been diagnosed with metastatic gastric cancer. His apixaban was stopped due to concern for bleeding in the setting of underlying malignancy per Onc.   Admitted in 07/2022 for acute saddle PE. TTE showed LVEF 55-60%, mild LVH, G1DD, mild RV enlargement, mild LAE, mild AS with mean gradient , borderline  aortic dilation. He was started on lovenox for [facility].  Today, ***     Past Medical History:  Diagnosis Date   A-fib (HCC)    Asthma    Cataract    Gastric cancer (HCC)    GERD (gastroesophageal reflux disease)    Lichen planus    Methotrexate, long term, current use    no longer taking   Psoriasis    Sleep apnea    Thyroid disease    Urticaria     Past Surgical History:  Procedure Laterality Date   CARDIOVERSION N/A 02/23/2020   Procedure: CARDIOVERSION;  Surgeon: Christell Constant, MD;  Location: [facility] ENDOSCOPY;  Service: Cardiovascular;  Laterality: N/A;   CARDIOVERSION N/A 04/12/2020   Procedure: CARDIOVERSION;  Surgeon: Jodelle Red, MD;  Location: [facility] ENDOSCOPY;  Service: Cardiovascular;  Laterality: N/A;   CATARACT EXTRACTION Left    2019   IR IMAGING GUIDED PORT INSERTION  05/03/2022   TONSILLECTOMY      Current Medications: No outpatient medications have been marked as taking for the 08/23/22 encounter (Appointment) with Meriam Sprague, MD.     Allergies:   Gold and Mixed grasses   Social History   Socioeconomic History   Marital status: Married    Spouse name: Not on file   Number of children: Not on file   Years of education: Not on file   Highest education level: Not on file  Occupational History   Not on file  Tobacco Use  Smoking status: Former    Types: Cigarettes    Quit date: 1982    Years since quitting: 42.2   Smokeless tobacco: Never  Vaping Use   Vaping Use: Not on file  Substance and Sexual Activity   Alcohol use: Yes    Alcohol/week: 1.0 standard drink of alcohol    Types: 1 Glasses of wine per week    Comment: occasionally   Drug use: No   Sexual activity: Not on file  Other Topics Concern   Not on file  Social History Narrative   Married with 1 son and 1 daughter   Former smoker no alcohol no caffeine no tobacco or drug use   Social Determinants of Health   Financial Resource Strain: Low Risk   (04/17/2022)   Overall Financial Resource Strain (CARDIA)    Difficulty of Paying Living Expenses: Not hard at all  Food Insecurity: No Food Insecurity (04/17/2022)   Hunger Vital Sign    Worried About Running Out of Food in the Last Year: Never true    Ran Out of Food in the Last Year: Never true  Transportation Needs: No Transportation Needs (04/17/2022)   PRAPARE - Administrator, Civil ServiceTransportation    Lack of Transportation (Medical): No    Lack of Transportation (Non-Medical): No  Physical Activity: Not on file  Stress: No Stress Concern Present (04/17/2022)   [employer] - Occupational Stress Questionnaire    Feeling of Stress : Not at all  Social Connections: Not on file     Family History: The patient's family history includes Diabetes in his mother; Leukemia in his maternal great-grandmother; Stroke in his mother. There is no history of Allergic rhinitis, Asthma, Eczema, Urticaria, Colon cancer, Esophageal cancer, or Stomach cancer.  ROS:   Please see the history of present illness.    Review of Systems  Constitutional:  Negative for chills and fever.  HENT:  Negative for congestion.   Eyes:  Negative for blurred vision.  Respiratory:  Positive for shortness of breath. Negative for cough and sputum production.   Cardiovascular:  Positive for palpitations. Negative for chest pain, orthopnea, claudication, leg swelling and PND.  Gastrointestinal:  Negative for heartburn, nausea and vomiting.  Genitourinary:  Negative for hematuria.  Musculoskeletal:  Positive for myalgias.  Neurological:  Negative for dizziness and loss of consciousness.  Endo/Heme/Allergies:  Negative for polydipsia.  Psychiatric/Behavioral:  Negative for depression.     EKGs/Labs/Other Studies Reviewed:    The following studies were reviewed today:  CT Chest  08/11/2020: IMPRESSION: 1. Peripheral basilar predominant pattern of subpleural reticulation and ground-glass may be due to  nonspecific interstitial pneumonitis or usual interstitial pneumonitis. Findings are categorized as probable UIP per consensus guidelines: Diagnosis of Idiopathic Pulmonary Fibrosis: An Official ATS/ERS/JRS/ALAT Clinical Practice Guideline. Am Rosezetta SchlatterJ Respir Crit Care Med Vol 198, Iss 5, [phone], Jan 06 2017. 2. Air trapping is indicative of small airways disease. 3. Subtle hypodense lesion in the liver dome, possibly new from prior exams. Further evaluation could be performed with MR abdomen without and with contrast, as clinically indicated. 4. Ascending aortic aneurysm, stable (4.0 cm). Recommend annual imaging followup by CTA or MRA. This recommendation follows 2010 ACCF/AHA/AATS/ACR/ASA/SCA/SCAI/SIR/STS/SVM Guidelines for the Diagnosis and Management of Patients with Thoracic Aortic Disease. Circulation. 2010; 121: W119-J478: [id number]. Aortic aneurysm NOS (ICD10-I71.9). 5. Aortic atherosclerosis (ICD10-I70.0). Coronary artery calcification. 6.  Emphysema (ICD10-J43.9).  TTE 01/23/20:  1. Left ventricular ejection fraction, by estimation, is 55 to 60%. The  left ventricle has  normal function. The left ventricle has no regional  wall motion abnormalities. There is mild concentric left ventricular  hypertrophy. Left ventricular diastolic  function could not be evaluated.   2. Right ventricular systolic function is normal. The right ventricular  size is normal. Tricuspid regurgitation signal is inadequate for assessing  PA pressure.   3. The mitral valve is grossly normal. Mild to moderate mitral valve  regurgitation. No evidence of mitral stenosis.   4. The aortic valve is tricuspid. There is moderate calcification of the  aortic valve. There is moderate thickening of the aortic valve. Aortic  valve regurgitation is not visualized. Mild to moderate aortic valve  sclerosis/calcification is present,  without any evidence of aortic stenosis.   5. Aortic dilatation noted. There is mild dilatation  of the aortic root,  measuring 42 mm.   6. The inferior vena cava is dilated in size with >50% respiratory  variability, suggesting right atrial pressure of 8 mmHg.   CT Chest 01/22/20: FINDINGS: Cardiovascular: There are no significant vascular findings. Scattered vascular calcifications are seen within the aorta. Coronary artery calcifications are seen. The heart size is normal. There is no pericardial thickening or effusion.   Mediastinum/Nodes: There are no enlarged mediastinal, hilar or axillary lymph nodes. The thyroid gland, trachea and esophagus demonstrate no significant findings.   Lungs/Pleura: Rounded patchy airspace opacity is seen within the posterior left lung base with mild bronchial wall thickening. There is small tree-in-bud opacities also noted within the right lung base.   Upper abdomen: The visualized portion of the upper abdomen is unremarkable.   Musculoskeletal/Chest wall: There is no chest wall mass or suspicious osseous finding. No acute osseous abnormality   IMPRESSION: Patchy airspace opacity with tree-in-bud opacities at the posterior left lung base which could be due to resolving infectious etiology from August 2021 or inflammatory process.    EKG:  EKG is personally reviewed. 05/24/2022:  Sinus rhythm. PVC. LVH. Rate 76 bpm. 04/28/2020:  NSR with HR 70  Recent Labs: 07/26/2022: B Natriuretic Peptide 431.3 07/28/2022: Magnesium 2.2 08/09/2022: ALT 14; BUN 18; Creatinine 0.84; Hemoglobin 11.7; Platelet Count 240; Potassium 3.5; Sodium 139   Recent Lipid Panel    Component Value Date/Time   CHOL 137 06/09/2020 0929   TRIG 143 06/09/2020 0929   HDL 37 (L) 06/09/2020 0929   CHOLHDL 3.7 06/09/2020 0929   LDLCALC 75 06/09/2020 0929     Risk Assessment/Calculations:     CHA2DS2-VASc Score = 2  This indicates a 2.2% annual risk of stroke. The patient's score is based upon: CHF History: 0 HTN History: 0 Diabetes History: 0 Stroke History:  0 Vascular Disease History: 1 Age Score: 1 Gender Score: 0    Physical Exam:    VS:  There were no vitals taken for this visit.    Wt Readings from Last 3 Encounters:  08/09/22 248 lb 3.2 oz (112.6 kg)  07/24/22 255 lb (115.7 kg)  07/12/22 256 lb (116.1 kg)     GEN:  Well nourished, well developed in no acute distress HEENT: Normal NECK: No JVD; No carotid bruits CARDIAC: RRR, 2/6 harsh systolic murmur, No rubs, no gallops RESPIRATORY:  Clear to auscultation without rales, wheezing or rhonchi  ABDOMEN: Soft, non-tender, non-distended MUSCULOSKELETAL:  Trace -1+ LE edema (chronic), warm SKIN: Warm and dry NEUROLOGIC:  Alert and oriented x 3 PSYCHIATRIC:  Normal affect   ASSESSMENT:    No diagnosis found.   PLAN:    In order of problems  listed above:  #Persistent Atrial fibrillation: CHADs-vasc 2.Failed DCCV on 02/23/20 (initial return to NSR but rapidly flipped back into Afib) and 04/12/20 but converted after amiodarone load. His amiodarone has since been stopped as he remained in rhythm and did not want the side-effects of the medication. Cardiac monitor 06/2022 with SVT but no Afib. His AC was stopped by Onc due to metastatic cancer with risk of bleeding. Unfortunately, he was admitted 07/2022 for acute PE and is now on lovenox.  -Continue lovenox for [facility] -Continue metop  XL daily -Continue metop  BID prn for palpitations  #Recent Saddle PE: Admitted in 07/2022 for acute saddle PE. Now on lovenox for [facility].  #Metastatic Gastric Cancer: Completed cycles of FOLFOX. Biopsy with metastatic poorly differentiated adenocarcinoma. EGD with large infiltrative mass with oozing bleeding and stigmata of recent bleeding in the gastric fundus, on the anterior wall of the gastric body and on the greater curvature of the gastric body; deformity in the gastric antrum. Follows with Dr. Truett Perna. -Follow-up with Dr. Truett Perna as scheduled  #Coronary calcification: #HLD: Noted on  CT chest. No exertional symptoms.  -Continue crestor  daily -LDL 73 in 10/2021 -Repeat at next visit for monitoring  #LE edema: Chronic and stable. TTE with normal BiV function, mild to moderate MR -Continue intermittent lasix as needed   #Mild-to-Moderate MR: Noted on TTE on 01/2020. -Repeat TTE for monitoring   #Aortic root aneurysm (4.27mm): Noted on TTE on 01/2020.  -Repeat TTE as above   #Lichen Planus: On chronic methotrexate. Off steroids now. -Follow-up with Derm as scheduled   Follow-up:  3 months.  Medication Adjustments/Labs and Tests Ordered: Current medicines are reviewed at length with the patient today.  Concerns regarding medicines are outlined above.   No orders of the defined types were placed in this encounter.  No orders of the defined types were placed in this encounter.  There are no Patient Instructions on file for this visit.   I,Mathew Stumpf,acting as a Neurosurgeon for Meriam Sprague, MD.,have documented all relevant documentation on the behalf of Meriam Sprague, MD,as directed by  Meriam Sprague, MD while in the presence of Meriam Sprague, MD.  I, Meriam Sprague, MD, have reviewed all documentation for this visit. The documentation on 08/14/22 for the exam, diagnosis, procedures, and orders are all accurate and complete.    Signed, Meriam Sprague, MD  08/14/2022 9:23 PM    [facility]

## 2022-08-20 ENCOUNTER — Other Ambulatory Visit: Payer: Self-pay | Admitting: Oncology

## 2022-08-23 ENCOUNTER — Ambulatory Visit: Payer: Medicare HMO | Attending: Cardiology | Admitting: Cardiology

## 2022-08-23 ENCOUNTER — Inpatient Hospital Stay (HOSPITAL_BASED_OUTPATIENT_CLINIC_OR_DEPARTMENT_OTHER): Payer: Medicare HMO | Admitting: Nurse Practitioner

## 2022-08-23 ENCOUNTER — Inpatient Hospital Stay: Payer: Medicare HMO

## 2022-08-23 ENCOUNTER — Inpatient Hospital Stay: Payer: Medicare HMO | Admitting: Nutrition

## 2022-08-23 ENCOUNTER — Encounter: Payer: Self-pay | Admitting: Nurse Practitioner

## 2022-08-23 ENCOUNTER — Encounter: Payer: Self-pay | Admitting: Cardiology

## 2022-08-23 VITALS — BP 102/66 | HR 87 | Ht 72.0 in | Wt 259.0 lb

## 2022-08-23 VITALS — BP 103/65 | HR 71

## 2022-08-23 VITALS — BP 101/55 | HR 78 | Temp 98.1°F | Resp 20 | Ht 72.0 in | Wt 255.0 lb

## 2022-08-23 DIAGNOSIS — I350 Nonrheumatic aortic (valve) stenosis: Secondary | ICD-10-CM

## 2022-08-23 DIAGNOSIS — I2584 Coronary atherosclerosis due to calcified coronary lesion: Secondary | ICD-10-CM

## 2022-08-23 DIAGNOSIS — C168 Malignant neoplasm of overlapping sites of stomach: Secondary | ICD-10-CM

## 2022-08-23 DIAGNOSIS — I4819 Other persistent atrial fibrillation: Secondary | ICD-10-CM

## 2022-08-23 DIAGNOSIS — E782 Mixed hyperlipidemia: Secondary | ICD-10-CM

## 2022-08-23 DIAGNOSIS — I2510 Atherosclerotic heart disease of native coronary artery without angina pectoris: Secondary | ICD-10-CM

## 2022-08-23 DIAGNOSIS — D6869 Other thrombophilia: Secondary | ICD-10-CM | POA: Diagnosis not present

## 2022-08-23 DIAGNOSIS — C169 Malignant neoplasm of stomach, unspecified: Secondary | ICD-10-CM

## 2022-08-23 DIAGNOSIS — I77810 Thoracic aortic ectasia: Secondary | ICD-10-CM

## 2022-08-23 DIAGNOSIS — Z5111 Encounter for antineoplastic chemotherapy: Secondary | ICD-10-CM | POA: Diagnosis not present

## 2022-08-23 LAB — CBC WITH DIFFERENTIAL (CANCER CENTER ONLY)
Abs Immature Granulocytes: 0.02 10*3/uL (ref 0.00–0.07)
Basophils Absolute: 0 10*3/uL (ref 0.0–0.1)
Basophils Relative: 1 %
Eosinophils Absolute: 0.1 10*3/uL (ref 0.0–0.5)
Eosinophils Relative: 2 %
HCT: 34.4 % — ABNORMAL LOW (ref 39.0–52.0)
Hemoglobin: 11.1 g/dL — ABNORMAL LOW (ref 13.0–17.0)
Immature Granulocytes: 0 %
Lymphocytes Relative: 30 %
Lymphs Abs: 2.4 10*3/uL (ref 0.7–4.0)
MCH: 29.9 pg (ref 26.0–34.0)
MCHC: 32.3 g/dL (ref 30.0–36.0)
MCV: 92.7 fL (ref 80.0–100.0)
Monocytes Absolute: 0.6 10*3/uL (ref 0.1–1.0)
Monocytes Relative: 8 %
Neutro Abs: 4.6 10*3/uL (ref 1.7–7.7)
Neutrophils Relative %: 59 %
Platelet Count: 146 10*3/uL — ABNORMAL LOW (ref 150–400)
RBC: 3.71 MIL/uL — ABNORMAL LOW (ref 4.22–5.81)
RDW: 18.1 % — ABNORMAL HIGH (ref 11.5–15.5)
WBC Count: 7.8 10*3/uL (ref 4.0–10.5)
nRBC: 0 % (ref 0.0–0.2)

## 2022-08-23 LAB — CMP (CANCER CENTER ONLY)
ALT: 11 U/L (ref 0–44)
AST: 13 U/L — ABNORMAL LOW (ref 15–41)
Albumin: 3.5 g/dL (ref 3.5–5.0)
Alkaline Phosphatase: 47 U/L (ref 38–126)
Anion gap: 7 (ref 5–15)
BUN: 16 mg/dL (ref 8–23)
CO2: 25 mmol/L (ref 22–32)
Calcium: 9.1 mg/dL (ref 8.9–10.3)
Chloride: 108 mmol/L (ref 98–111)
Creatinine: 0.83 mg/dL (ref 0.61–1.24)
GFR, Estimated: >60 mL/min (ref >60)
Glucose, Bld: 89 mg/dL (ref 70–99)
Potassium: 3.5 mmol/L (ref 3.5–5.1)
Sodium: 140 mmol/L (ref 135–145)
Total Bilirubin: 0.4 mg/dL (ref 0.3–1.2)
Total Protein: 6.1 g/dL — ABNORMAL LOW (ref 6.5–8.1)

## 2022-08-23 MED ORDER — LEUCOVORIN CALCIUM INJECTION 350 MG
400.0000 mg/m2 | Freq: Once | INTRAVENOUS | Status: AC
  Administered 2022-08-23: 956 mg via INTRAVENOUS
  Filled 2022-08-23: qty 47.8

## 2022-08-23 MED ORDER — PROCHLORPERAZINE MALEATE 10 MG PO TABS
10.0000 mg | ORAL_TABLET | Freq: Four times a day (QID) | ORAL | 1 refills | Status: DC | PRN
Start: 2022-08-23 — End: 2023-05-04

## 2022-08-23 MED ORDER — FLUOROURACIL CHEMO INJECTION 2.5 GM/50ML
400.0000 mg/m2 | Freq: Once | INTRAVENOUS | Status: AC
  Administered 2022-08-23: 1000 mg via INTRAVENOUS
  Filled 2022-08-23: qty 20

## 2022-08-23 MED ORDER — SODIUM CHLORIDE 0.9 % IV SOLN
2000.0000 mg/m2 | INTRAVENOUS | Status: DC
  Administered 2022-08-23: 5000 mg via INTRAVENOUS
  Filled 2022-08-23: qty 100

## 2022-08-23 MED ORDER — SODIUM CHLORIDE 0.9 % IV SOLN
10.0000 mg | Freq: Once | INTRAVENOUS | Status: AC
  Administered 2022-08-23: 10 mg via INTRAVENOUS
  Filled 2022-08-23: qty 1

## 2022-08-23 MED ORDER — DEXTROSE 5 % IV SOLN: Freq: Once | INTRAVENOUS | Status: AC

## 2022-08-23 MED ORDER — PALONOSETRON HCL INJECTION 0.25 MG/5ML
0.2500 mg | Freq: Once | INTRAVENOUS | Status: AC
  Administered 2022-08-23: 0.25 mg via INTRAVENOUS
  Filled 2022-08-23: qty 5

## 2022-08-23 MED ORDER — OXALIPLATIN CHEMO INJECTION 100 MG/20ML
65.0000 mg/m2 | Freq: Once | INTRAVENOUS | Status: AC
  Administered 2022-08-23: 150 mg via INTRAVENOUS
  Filled 2022-08-23: qty 20

## 2022-08-23 NOTE — Patient Instructions (Addendum)
[facility]  The chemotherapy medication bag should finish at 46 hours, 96 hours, or 7 days. For example, if your pump is scheduled for 46 hours and it was put on at 4:00 p.m., it should finish at 2:00 p.m. the day it is scheduled to come off regardless of your appointment time.     Estimated time to finish at 11:30 Friday, August 25, 2022.   If the display on your pump reads "Low Volume" and it is beeping, take the batteries out of the pump and come to the cancer center for it to be taken off.   If the pump alarms go off prior to the pump reading "Low Volume" then call [phone] and someone can assist you.  If the plunger comes out and the chemotherapy medication is leaking out, please use your home chemo spill kit to clean up the spill. Do NOT use paper towels or other household products.  If you have problems or questions regarding your pump, please call either [phone] (24 hours a day) or the cancer center Monday-Friday 8:00 a.m.- 4:30 p.m. at the clinic number and we will assist you. If you are unable to get assistance, then go to the nearest Emergency Department and ask the staff to contact the IV team for assistance.   Discharge Instructions: Thank you for choosing [facility] to provide your oncology and hematology care.   If you have a lab appointment with the Cancer Center, please go directly to the Cancer Center and check in at the registration area.   Wear comfortable clothing and clothing appropriate for easy access to any Portacath or PICC line.   We strive to give you quality time with your provider. You may need to reschedule your appointment if you arrive late (15 or more minutes).  Arriving late affects you and other patients whose appointments are after yours.  Also, if you miss three or more appointments without notifying the office, you may be dismissed from the clinic at the provider's discretion.      For  prescription refill requests, have your pharmacy contact our office and allow 72 hours for refills to be completed.    Today you received the following chemotherapy and/or immunotherapy agents Oxaliplatin, Leucovorin, Fluorouracil      To help prevent nausea and vomiting after your treatment, we encourage you to take your nausea medication as directed.  BELOW ARE SYMPTOMS THAT SHOULD BE REPORTED IMMEDIATELY: *FEVER GREATER THAN 100.4 F (38 C) OR HIGHER *CHILLS OR SWEATING *NAUSEA AND VOMITING THAT IS NOT CONTROLLED WITH YOUR NAUSEA MEDICATION *UNUSUAL SHORTNESS OF BREATH *UNUSUAL BRUISING OR BLEEDING *URINARY PROBLEMS (pain or burning when urinating, or frequent urination) *BOWEL PROBLEMS (unusual diarrhea, constipation, pain near the anus) TENDERNESS IN MOUTH AND THROAT WITH OR WITHOUT PRESENCE OF ULCERS (sore throat, sores in mouth, or a toothache) UNUSUAL RASH, SWELLING OR PAIN  UNUSUAL VAGINAL DISCHARGE OR ITCHING   Items with * indicate a potential emergency and should be followed up as soon as possible or go to the Emergency Department if any problems should occur.  Please show the CHEMOTHERAPY ALERT CARD or IMMUNOTHERAPY ALERT CARD at check-in to the Emergency Department and triage nurse.  Should you have questions after your visit or need to cancel or reschedule your appointment, please contact [facility]  Dept: [phone]  and follow the prompts.  Office hours are 8:00 a.m. to 4:30 p.m. Monday - Friday. Please note  that voicemails left after 4:00 p.m. may not be returned until the following business day.  We are closed weekends and major holidays. You have access to a nurse at all times for urgent questions. Please call the main number to the clinic Dept: [phone] and follow the prompts.   For any non-urgent questions, you may also contact your provider using MyChart. We now offer e-Visits for anyone 70 and older to request care online for  non-urgent symptoms. For details visit [email or web address].   Also download the MyChart app! Go to the app store, search "MyChart", open the app, select [place], and log in with your MyChart username and password.  Oxaliplatin Injection What is this medication? OXALIPLATIN (ox AL i PLA tin) treats colorectal cancer. It works by slowing down the growth of cancer cells. This medicine may be used for other purposes; ask your health care provider or pharmacist if you have questions. COMMON BRAND NAME(S): Eloxatin What should I tell my care team before I take this medication? They need to know if you have any of these conditions: Heart disease History of irregular heartbeat or rhythm Liver disease Low blood cell levels (white cells, red cells, and platelets) Lung or breathing disease, such as asthma Take medications that treat or prevent blood clots Tingling of the fingers, toes, or other nerve disorder An unusual or allergic reaction to oxaliplatin, other medications, foods, dyes, or preservatives If you or your partner are pregnant or trying to get pregnant Breast-feeding How should I use this medication? This medication is injected into a vein. It is given by your care team in a hospital or clinic setting. Talk to your care team about the use of this medication in children. Special care may be needed. Overdosage: If you think you have taken too much of this medicine contact a poison control center or emergency room at once. NOTE: This medicine is only for you. Do not share this medicine with others. What if I miss a dose? Keep appointments for follow-up doses. It is important not to miss a dose. Call your care team if you are unable to keep an appointment. What may interact with this medication? Do not take this medication with any of the following: Cisapride Dronedarone Pimozide Thioridazine This medication may also interact with the following: Aspirin and aspirin-like  medications Certain medications that treat or prevent blood clots, such as warfarin, apixaban, dabigatran, and rivaroxaban Cisplatin Cyclosporine Diuretics Medications for infection, such as acyclovir, adefovir, amphotericin B, bacitracin, cidofovir, foscarnet, ganciclovir, gentamicin, pentamidine, vancomycin NSAIDs, medications for pain and inflammation, such as ibuprofen or naproxen Other medications that cause heart rhythm changes Pamidronate Zoledronic acid This list may not describe all possible interactions. Give your health care provider a list of all the medicines, herbs, non-prescription drugs, or dietary supplements you use. Also tell them if you smoke, drink alcohol, or use illegal drugs. Some items may interact with your medicine. What should I watch for while using this medication? Your condition will be monitored carefully while you are receiving this medication. You may need blood work while taking this medication. This medication may make you feel generally unwell. This is not uncommon as chemotherapy can affect healthy cells as well as cancer cells. Report any side effects. Continue your course of treatment even though you feel ill unless your care team tells you to stop. This medication may increase your risk of getting an infection. Call your care team for advice if you get a fever, chills, sore throat, or  other symptoms of a cold or flu. Do not treat yourself. Try to avoid being around people who are sick. Avoid taking medications that contain aspirin, acetaminophen, ibuprofen, naproxen, or ketoprofen unless instructed by your care team. These medications may hide a fever. Be careful brushing or flossing your teeth or using a toothpick because you may get an infection or bleed more easily. If you have any dental work done, tell your dentist you are receiving this medication. This medication can make you more sensitive to cold. Do not drink cold drinks or use ice. Cover exposed  skin before coming in contact with cold temperatures or cold objects. When out in cold weather wear warm clothing and cover your mouth and nose to warm the air that goes into your lungs. Tell your care team if you get sensitive to the cold. Talk to your care team if you or your partner are pregnant or think either of you might be pregnant. This medication can cause serious birth defects if taken during pregnancy and for 9 months after the last dose. A negative pregnancy test is required before starting this medication. A reliable form of contraception is recommended while taking this medication and for 9 months after the last dose. Talk to your care team about effective forms of contraception. Do not father a child while taking this medication and for 6 months after the last dose. Use a condom while having sex during this time period. Do not breastfeed while taking this medication and for 3 months after the last dose. This medication may cause infertility. Talk to your care team if you are concerned about your fertility. What side effects may I notice from receiving this medication? Side effects that you should report to your care team as soon as possible: Allergic reactions--skin rash, itching, hives, swelling of the face, lips, tongue, or throat Bleeding--bloody or black, tar-like stools, vomiting blood or brown material that looks like coffee grounds, red or dark brown urine, small red or purple spots on skin, unusual bruising or bleeding Dry cough, shortness of breath or trouble breathing Heart rhythm changes--fast or irregular heartbeat, dizziness, feeling faint or lightheaded, chest pain, trouble breathing Infection--fever, chills, cough, sore throat, wounds that don't heal, pain or trouble when passing urine, general feeling of discomfort or being unwell Liver injury--right upper belly pain, loss of appetite, nausea, light-colored stool, dark yellow or brown urine, yellowing skin or eyes, unusual  weakness or fatigue Low red blood cell level--unusual weakness or fatigue, dizziness, headache, trouble breathing Muscle injury--unusual weakness or fatigue, muscle pain, dark yellow or brown urine, decrease in amount of urine Pain, tingling, or numbness in the hands or feet Sudden and severe headache, confusion, change in vision, seizures, which may be signs of posterior reversible encephalopathy syndrome (PRES) Unusual bruising or bleeding Side effects that usually do not require medical attention (report to your care team if they continue or are bothersome): Diarrhea Nausea Pain, redness, or swelling with sores inside the mouth or throat Unusual weakness or fatigue Vomiting This list may not describe all possible side effects. Call your doctor for medical advice about side effects. You may report side effects to FDA at 1-800-FDA-1088. Where should I keep my medication? This medication is given in a hospital or clinic. It will not be stored at home. NOTE: This sheet is a summary. It may not cover all possible information. If you have questions about this medicine, talk to your doctor, pharmacist, or health care provider.  2023 Elsevier/Gold Standard (  2007-06-15 00:00:00) Leucovorin Injection What is this medication? LEUCOVORIN (loo koe VOR in) prevents side effects from certain medications, such as methotrexate. It works by increasing folate levels. This helps protect healthy cells in your body. It may also be used to treat anemia caused by low levels of folate. It can also be used with fluorouracil, a type of chemotherapy, to treat colorectal cancer. It works by increasing the effects of fluorouracil in the body. This medicine may be used for other purposes; ask your health care provider or pharmacist if you have questions. What should I tell my care team before I take this medication? They need to know if you have any of these conditions: Anemia from low levels of vitamin B12 in the  blood An unusual or allergic reaction to leucovorin, folic acid, other medications, foods, dyes, or preservatives Pregnant or trying to get pregnant Breastfeeding How should I use this medication? This medication is injected into a vein or a muscle. It is given by your care team in a hospital or clinic setting. Talk to your care team about the use of this medication in children. Special care may be needed. Overdosage: If you think you have taken too much of this medicine contact a poison control center or emergency room at once. NOTE: This medicine is only for you. Do not share this medicine with others. What if I miss a dose? Keep appointments for follow-up doses. It is important not to miss your dose. Call your care team if you are unable to keep an appointment. What may interact with this medication? Capecitabine Fluorouracil Phenobarbital Phenytoin Primidone Trimethoprim;sulfamethoxazole This list may not describe all possible interactions. Give your health care provider a list of all the medicines, herbs, non-prescription drugs, or dietary supplements you use. Also tell them if you smoke, drink alcohol, or use illegal drugs. Some items may interact with your medicine. What should I watch for while using this medication? Your condition will be monitored carefully while you are receiving this medication. This medication may increase the side effects of 5-fluorouracil. Tell your care team if you have diarrhea or mouth sores that do not get better or that get worse. What side effects may I notice from receiving this medication? Side effects that you should report to your care team as soon as possible: Allergic reactions--skin rash, itching, hives, swelling of the face, lips, tongue, or throat This list may not describe all possible side effects. Call your doctor for medical advice about side effects. You may report side effects to FDA at 1-800-FDA-1088. Where should I keep my  medication? This medication is given in a hospital or clinic. It will not be stored at home. NOTE: This sheet is a summary. It may not cover all possible information. If you have questions about this medicine, talk to your doctor, pharmacist, or health care provider.  2023 Elsevier/Gold Standard (2021-09-02 00:00:00) Fluorouracil Injection What is this medication? FLUOROURACIL (flure oh YOOR a sil) treats some types of cancer. It works by slowing down the growth of cancer cells. This medicine may be used for other purposes; ask your health care provider or pharmacist if you have questions. COMMON BRAND NAME(S): Adrucil What should I tell my care team before I take this medication? They need to know if you have any of these conditions: Blood disorders Dihydropyrimidine dehydrogenase (DPD) deficiency Infection, such as chickenpox, cold sores, herpes Kidney disease Liver disease Poor nutrition Recent or ongoing radiation therapy An unusual or allergic reaction to fluorouracil, other  medications, foods, dyes, or preservatives If you or your partner are pregnant or trying to get pregnant Breast-feeding How should I use this medication? This medication is injected into a vein. It is administered by your care team in a hospital or clinic setting. Talk to your care team about the use of this medication in children. Special care may be needed. Overdosage: If you think you have taken too much of this medicine contact a poison control center or emergency room at once. NOTE: This medicine is only for you. Do not share this medicine with others. What if I miss a dose? Keep appointments for follow-up doses. It is important not to miss your dose. Call your care team if you are unable to keep an appointment. What may interact with this medication? Do not take this medication with any of the following: Live virus vaccines This medication may also interact with the following: Medications that treat or  prevent blood clots, such as warfarin, enoxaparin, dalteparin This list may not describe all possible interactions. Give your health care provider a list of all the medicines, herbs, non-prescription drugs, or dietary supplements you use. Also tell them if you smoke, drink alcohol, or use illegal drugs. Some items may interact with your medicine. What should I watch for while using this medication? Your condition will be monitored carefully while you are receiving this medication. This medication may make you feel generally unwell. This is not uncommon as chemotherapy can affect healthy cells as well as cancer cells. Report any side effects. Continue your course of treatment even though you feel ill unless your care team tells you to stop. In some cases, you may be given additional medications to help with side effects. Follow all directions for their use. This medication may increase your risk of getting an infection. Call your care team for advice if you get a fever, chills, sore throat, or other symptoms of a cold or flu. Do not treat yourself. Try to avoid being around people who are sick. This medication may increase your risk to bruise or bleed. Call your care team if you notice any unusual bleeding. Be careful brushing or flossing your teeth or using a toothpick because you may get an infection or bleed more easily. If you have any dental work done, tell your dentist you are receiving this medication. Avoid taking medications that contain aspirin, acetaminophen, ibuprofen, naproxen, or ketoprofen unless instructed by your care team. These medications may hide a fever. Do not treat diarrhea with over the counter products. Contact your care team if you have diarrhea that lasts more than 2 days or if it is severe and watery. This medication can make you more sensitive to the sun. Keep out of the sun. If you cannot avoid being in the sun, wear protective clothing and sunscreen. Do not use sun lamps,  tanning beds, or tanning booths. Talk to your care team if you or your partner wish to become pregnant or think you might be pregnant. This medication can cause serious birth defects if taken during pregnancy and for 3 months after the last dose. A reliable form of contraception is recommended while taking this medication and for 3 months after the last dose. Talk to your care team about effective forms of contraception. Do not father a child while taking this medication and for 3 months after the last dose. Use a condom while having sex during this time period. Do not breastfeed while taking this medication. This medication may cause  infertility. Talk to your care team if you are concerned about your fertility. What side effects may I notice from receiving this medication? Side effects that you should report to your care team as soon as possible: Allergic reactions--skin rash, itching, hives, swelling of the face, lips, tongue, or throat Heart attack--pain or tightness in the chest, shoulders, arms, or jaw, nausea, shortness of breath, cold or clammy skin, feeling faint or lightheaded Heart failure--shortness of breath, swelling of the ankles, feet, or hands, sudden weight gain, unusual weakness or fatigue Heart rhythm changes--fast or irregular heartbeat, dizziness, feeling faint or lightheaded, chest pain, trouble breathing High ammonia level--unusual weakness or fatigue, confusion, loss of appetite, nausea, vomiting, seizures Infection--fever, chills, cough, sore throat, wounds that don't heal, pain or trouble when passing urine, general feeling of discomfort or being unwell Low red blood cell level--unusual weakness or fatigue, dizziness, headache, trouble breathing Pain, tingling, or numbness in the hands or feet, muscle weakness, change in vision, confusion or trouble speaking, loss of balance or coordination, trouble walking, seizures Redness, swelling, and blistering of the skin over hands and  feet Severe or prolonged diarrhea Unusual bruising or bleeding Side effects that usually do not require medical attention (report to your care team if they continue or are bothersome): Dry skin Headache Increased tears Nausea Pain, redness, or swelling with sores inside the mouth or throat Sensitivity to light Vomiting This list may not describe all possible side effects. Call your doctor for medical advice about side effects. You may report side effects to FDA at 1-800-FDA-1088. Where should I keep my medication? This medication is given in a hospital or clinic. It will not be stored at home. NOTE: This sheet is a summary. It may not cover all possible information. If you have questions about this medicine, talk to your doctor, pharmacist, or health care provider.  2023 Elsevier/Gold Standard (2021-08-23 00:00:00)

## 2022-08-23 NOTE — Patient Instructions (Signed)
Medication Instructions:   Your physician recommends that you continue on your current medications as directed. Please refer to the Current Medication list given to you today.  *If you need a refill on your cardiac medications before your next appointment, please call your pharmacy*    Follow-Up: At [facility], you and your health needs are our priority.  As part of our continuing mission to provide you with exceptional heart care, we have created designated Provider Care Teams.  These Care Teams include your primary Cardiologist (physician) and Advanced Practice Providers (APPs -  Physician Assistants and Nurse Practitioners) who all work together to provide you with the care you need, when you need it.  We recommend signing up for the patient portal called "MyChart".  Sign up information is provided on this After Visit Summary.  MyChart is used to connect with patients for Virtual Visits (Telemedicine).  Patients are able to view lab/test results, encounter notes, upcoming appointments, etc.  Non-urgent messages can be sent to your provider as well.   To learn more about what you can do with MyChart, go to [email or web address].    Your next appointment:   6 month(s)  Provider:   Heather E Pemberton, MD       

## 2022-08-23 NOTE — Progress Notes (Signed)
Patient seen by Lisa Thomas NP today  Vitals are within treatment parameters.  Labs reviewed by Lisa Thomas NP and are within treatment parameters.  Per physician team, patient is ready for treatment and there are NO modifications to the treatment plan.     

## 2022-08-23 NOTE — Progress Notes (Signed)
Nutrition follow up completed with patient during infusion for Gastric Cancer. He is followed by Dr. Truett Perna. Receiving cycle 8 of FOLFOX. No clinical evidence of disease progression. Restaging CT after cycle 9.  Noted weight improved and documented as 255 pounds on April 17 from 248 pounds on April 3.  Labs reviewed.  Appetite is okay. Taste alterations continue and limit his ability to enjoy food. He has occasional diarrhea. He has not been using baking soda and salt water gargles. No other nutrition impact symptoms verbalized. Reports he has adequate supply of ONS.  Nutrition Diagnosis: Unintended wt loss stable.  Intervention: Continue small frequent meals and snacks for wt maintenance. Resume baking soda and salt water gargles before meals and snacks. Try zinc lozenges to improve taste.  Monitoring, Evaluation, Goals: Tolerate adequate calories and protein to minimize loss of lean body mass.  Next Visit: To be scheduled as needed.

## 2022-08-23 NOTE — Progress Notes (Signed)
[facility] OFFICE PROGRESS NOTE   Diagnosis: Gastric cancer  INTERVAL HISTORY:   Todd Harrison returns as scheduled.  He completed a cycle of FOLFOX for 08/09/2022.  No nausea or vomiting following chemo.  He had 2 episodes of nausea after eating dinner last week.  No vomiting.  No mouth sores.  Occasional loose stools.  He noted the cold sensitivity was less than with previous cycles.  No persistent neuropathy symptoms.  He denies significant abdominal distention.  He continues Lovenox.  He reports a single nosebleed since last office visit.  Objective:  Vital signs in last 24 hours:  Blood pressure (!) 101/55, pulse 78, temperature 98.1 F (36.7 C), temperature source Oral, resp. rate 20, height 6' (1.829 m), weight 255 lb (115.7 kg), SpO2 98 %.    HEENT: No thrush or ulcers. Resp: Lungs clear bilaterally. Cardio: Regular rate and rhythm. GI: Abdomen is mildly distended.  No hepatomegaly.  No mass. Vascular: Trace edema lower leg bilaterally right slightly greater than left. Neuro: Mild to moderate decrease in vibratory sense at the fingertips bilaterally. Skin: Palms without erythema. Port-A-Cath without erythema.  Lab Results:  Lab Results  Component Value Date   WBC 7.8 08/23/2022   HGB 11.1 (L) 08/23/2022   HCT 34.4 (L) 08/23/2022   MCV 92.7 08/23/2022   PLT 146 (L) 08/23/2022   NEUTROABS 4.6 08/23/2022    Imaging:  No results found.  Medications: I have reviewed the patient's current medications.  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  Internal hemorrhoids.  No specimens collected. Upper endoscopy 04/25/2022-large infiltrative mass with oozing bleeding and stigmata of recent bleeding in the gastric fundus, on the anterior wall of the gastric body and on the greater curvature of the gastric body; deformity in the gastric antrum.  Extrinsic deformity in the entire duodenum.  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 Her2 by IHC negative, 1+; MMR IHC normal; PD-L1 CPS 1%, positive.  Cycle 1 FOLFOX 05/04/2022 Cycle 2 FOLFOX 05/17/2022 Cycle 3 FOLFOX 05/31/2022-oxaliplatin dose reduced secondary to prolonged cold sensitivity Cycle 4 FOLFOX 06/14/2022 Cycle 5 FOLFOX 06/28/2022 CT abdomen/pelvis 07/08/2022-asymmetric wall thickening at the lesser curvature of the stomach, decreased ascites, increased diffuse omental and peritoneal nodularity Cycle 6 FOLFOX 07/12/2022 Cycle 7 FOLFOX 08/09/2022 Cycle 8 FOLFOX 08/23/2022 Truncal rash-status post recent punch biopsy with pathology pending; biopsy reported negative per family 04/26/2022 Lichen planus of the feet Hypothyroidism Atrial fibrillation Admission 07/24/2022 with acute bilateral pulmonary embolism/right heart strain Heparin anticoagulation 07/24/2022 Doppler 07/24/2022-acute DVT of the right femoral, popliteal, posterior tibial, and peroneal veins, acute left posterior tibial DVT 7.  Anemia secondary to chemotherapy, phlebotomy, and hemoptysis-improved  Disposition: Todd Harrison appears stable.  He has completed 7 cycles of FOLFOX.  There is no clinical evidence of disease progression.  Plan to proceed with cycle 8 today as scheduled.  Restaging CTs after cycle 9.  CBC and chemistry panel reviewed.  Labs adequate to proceed with treatment today as scheduled.  He will return for lab, follow-up, cycle 9 FOLFOX in 2 weeks.  We are available to see him sooner if needed.    Lonna Cobb ANP/GNP-BC  08/23/2022  8:58 AM

## 2022-08-25 ENCOUNTER — Inpatient Hospital Stay: Payer: Medicare HMO

## 2022-08-25 VITALS — BP 98/66 | HR 76 | Temp 97.2°F | Resp 18

## 2022-08-25 DIAGNOSIS — C168 Malignant neoplasm of overlapping sites of stomach: Secondary | ICD-10-CM

## 2022-08-25 DIAGNOSIS — Z5111 Encounter for antineoplastic chemotherapy: Secondary | ICD-10-CM | POA: Diagnosis not present

## 2022-08-25 MED ORDER — SODIUM CHLORIDE 0.9% FLUSH
10.0000 mL | INTRAVENOUS | Status: DC | PRN
  Administered 2022-08-25: 10 mL

## 2022-08-25 MED ORDER — HEPARIN SOD (PORK) LOCK FLUSH 100 UNIT/ML IV SOLN
500.0000 [IU] | Freq: Once | INTRAVENOUS | Status: AC | PRN
  Administered 2022-08-25: 500 [IU]

## 2022-08-25 NOTE — Patient Instructions (Signed)

## 2022-08-28 ENCOUNTER — Encounter: Payer: Self-pay | Admitting: Oncology

## 2022-08-29 ENCOUNTER — Telehealth: Payer: Self-pay | Admitting: *Deleted

## 2022-08-29 NOTE — Telephone Encounter (Signed)
Called MR. Gassmann to f/u and he reports his enoxaparin came in today and he is back on medication.

## 2022-09-01 ENCOUNTER — Ambulatory Visit: Payer: Medicare HMO | Admitting: Pulmonary Disease

## 2022-09-01 ENCOUNTER — Encounter: Payer: Self-pay | Admitting: Pulmonary Disease

## 2022-09-01 VITALS — BP 120/80 | HR 75 | Ht 72.0 in | Wt 256.0 lb

## 2022-09-01 DIAGNOSIS — G4733 Obstructive sleep apnea (adult) (pediatric): Secondary | ICD-10-CM

## 2022-09-01 DIAGNOSIS — D849 Immunodeficiency, unspecified: Secondary | ICD-10-CM

## 2022-09-01 DIAGNOSIS — J4530 Mild persistent asthma, uncomplicated: Secondary | ICD-10-CM

## 2022-09-01 DIAGNOSIS — I2602 Saddle embolus of pulmonary artery with acute cor pulmonale: Secondary | ICD-10-CM

## 2022-09-01 DIAGNOSIS — C168 Malignant neoplasm of overlapping sites of stomach: Secondary | ICD-10-CM

## 2022-09-01 NOTE — Progress Notes (Signed)
Synopsis: Referred in Dec 2020 for sinus infections by Burton Apley, MD  Subjective:   PATIENT ID: Todd Harrison GENDER: male DOB: 1946-01-13, MRN: [id number]  Chief Complaint  Patient presents with   Hospitalization Follow-up    F/up    PMH of recurrent sinus infectious and SOB/Cough. Todd Harrison was was sick at this time. Symptoms started 6 weeks ago. Usually if his sinuses are inflammated he has tooth pain. He didn't have it this time. He has been coughing now for >6 weeks. 4 weeks ago now he was up at night time with cough. His cough is 99% better since last week. Family members were swabbed for covid and were negative. He has not been tested for covid. Former smoker quit 1982, quit in 10 years, but smoked 4-5 ppd at the time.  At this time his cough is much better.  He has no chest pain.  Patient denies hemoptysis or dark sputum production.  He states that once he had this appointment made his symptoms started to improve slowly.  He has not ever had any pulmonary function test in the past.  He does have some sleep troubles.  He wakes up with a hoarse voice.  He gets up several times in the middle of the night to pee.  He does snore per his wife.  He has never had a sleep study.  Occupation: Patient is a Youth worker, upright base, electric bass and trombone.  OV 09/16/2020: Patient last seen by me in 2020.  Here today for follow-up.  Has subsequently been seen by Rikki Spearing.  Last seen in the office in January 2022.  Not taking any of his inhalers at this time.  He said he felt like he was doing better so he stopped taking his inhaler medications.  He is still taking his immunosuppression on occasion not taking it regularly for the lichen planus.  He is taking his Accutane regularly.  He did have a home sleep study that was completed that showed severe OSA but he does not wear this routinely.  He tries to wear it is much as he can at nighttime but tends to pull it off when he is asleep  and he finds it in the floor or disconnected when he wakes up.  His reflux is also not controlled but he does try to titrate medications to help with his reflux.  OV 06/17/2021: Here today for follow up after pfts. Review pfts results with patient. Cough is better. No recent exacerbations.  From a respiratory standpoint doing well.  Has no significant complaints today.  He does have issues ongoing with his medications for lichen planus.  His lower extremities are a little bit more swollen today than usual he says.  Lichen planus on his feet make mobility much more difficult as he gets older.  OV 07/03/2022: Here today for follow-up.  His cough symptoms have really improved over the past year however since November he was diagnosed with stage IV stomach cancer and has been undergoing treatments with medical oncology.  He has noticed over the past couple weeks he has had increased cough and sputum production.  Is been using his albuterol inhaler more.  He thinks that this is related to his swallowing ability.  His swallowing ability was evaluated in 2021 he was told he had aspiration risk.  He has noticed increased situations where he has cough after eating.  And has even expelled food before or after eating.  OV 09/01/2022: Here  today for follow-up after having hemoptysis seen at the emergency department CTA of the chest revealing large pulmonary embolism with right heart strain.  Due to his current cancer history of stage IV stomach cancer he has been placed on Lovenox.  His hemoptysis has since resolved.  Overall he feels better breathing better.  He is still rarely using his inhaler.  For breakthrough symptoms.  He tries to remember to use his Virgel Bouquet but is not always consistent.    Past Medical History:  Diagnosis Date   A-fib (HCC)    Asthma    Cataract    Gastric cancer (HCC)    GERD (gastroesophageal reflux disease)    Lichen planus    Methotrexate, long term, current use    no longer taking    Psoriasis    Sleep apnea    Thyroid disease    Urticaria      Family History  Problem Relation Age of Onset   Diabetes Mother    Stroke Mother    Leukemia Maternal Great-grandmother    Allergic rhinitis Neg Hx    Asthma Neg Hx    Eczema Neg Hx    Urticaria Neg Hx    Colon cancer Neg Hx    Esophageal cancer Neg Hx    Stomach cancer Neg Hx      Past Surgical History:  Procedure Laterality Date   CARDIOVERSION N/A 02/23/2020   Procedure: CARDIOVERSION;  Surgeon: Christell Constant, MD;  Location: [facility] ENDOSCOPY;  Service: Cardiovascular;  Laterality: N/A;   CARDIOVERSION N/A 04/12/2020   Procedure: CARDIOVERSION;  Surgeon: Jodelle Red, MD;  Location: [facility] ENDOSCOPY;  Service: Cardiovascular;  Laterality: N/A;   CATARACT EXTRACTION Left    2019   IR IMAGING GUIDED PORT INSERTION  05/03/2022   TONSILLECTOMY      Social History   Socioeconomic History   Marital status: Married    Spouse name: Not on file   Number of children: Not on file   Years of education: Not on file   Highest education level: Not on file  Occupational History   Not on file  Tobacco Use   Smoking status: Former    Types: Cigarettes    Quit date: 1982    Years since quitting: 42.3   Smokeless tobacco: Never  Vaping Use   Vaping Use: Not on file  Substance and Sexual Activity   Alcohol use: Yes    Alcohol/week: 1.0 standard drink of alcohol    Types: 1 Glasses of wine per week    Comment: occasionally   Drug use: No   Sexual activity: Not on file  Other Topics Concern   Not on file  Social History Narrative   Married with 1 son and 1 daughter   Former smoker no alcohol no caffeine no tobacco or drug use   Social Determinants of Health   Financial Resource Strain: Low Risk  (04/17/2022)   Overall Financial Resource Strain (CARDIA)    Difficulty of Paying Living Expenses: Not hard at all  Food Insecurity: No Food Insecurity (04/17/2022)   Hunger Vital Sign    Worried About  Running Out of Food in the Last Year: Never true    Ran Out of Food in the Last Year: Never true  Transportation Needs: No Transportation Needs (04/17/2022)   PRAPARE - Administrator, Civil Service (Medical): No    Lack of Transportation (Non-Medical): No  Physical Activity: Not on file  Stress: No Stress Concern Present (  04/17/2022)   [employer] [employer] - Occupational Stress Questionnaire    Feeling of Stress : Not at all  Social Connections: Not on file  Intimate Partner Violence: Not At Risk (04/17/2022)   Humiliation, Afraid, Rape, and Kick questionnaire    Fear of Current or Ex-Partner: No    Emotionally Abused: No    Physically Abused: No    Sexually Abused: No     Allergies  Allergen Reactions   Gold Itching   Mixed Grasses     unknown     Outpatient Medications Prior to Visit  Medication Sig Dispense Refill   albuterol (VENTOLIN HFA) 108 (90 Base) MCG/ACT inhaler Inhale 2 puffs into the lungs every 6 (six) hours as needed for wheezing or shortness of breath. 8 g 1   enoxaparin (LOVENOX) 120 MG/0.8ML injection Inject 0.8 mLs (120 mg total) into the skin every 12 (twelve) hours. 48 mL 2   fluticasone (FLONASE) 50 MCG/ACT nasal spray Place 1 spray into both nostrils daily as needed for allergies or rhinitis.     fluticasone furoate-vilanterol (BREO ELLIPTA) 100-25 MCG/ACT AEPB Inhale 1 puff into the lungs daily. 60 each 6   hydroxychloroquine (PLAQUENIL) 200 MG tablet Take 200 mg by mouth 2 (two) times daily.     ISOtretinoin (ACCUTANE) 40 MG capsule Take 40 mg by mouth 2 (two) times daily.      KLOR-CON M20 20 MEQ tablet TAKE 1 TABLET BY MOUTH EVERY DAY 90 tablet 1   levothyroxine (SYNTHROID) 112 MCG tablet Take 112 mcg by mouth daily before breakfast.     lidocaine-prilocaine (EMLA) cream Apply 1 Application topically as needed (Apply 1 hour before coming to Cancer Center for accessing). 30 g 2   metoprolol succinate (TOPROL-XL) 50 MG 24  hr tablet TAKE 1 TABLET BY MOUTH EVERY DAY 90 tablet 3   montelukast (SINGULAIR) 10 MG tablet Take 10 mg by mouth at bedtime.     omeprazole (PRILOSEC) 40 MG capsule Take 40 mg by mouth daily.     ondansetron (ZOFRAN) 8 MG tablet Take 1 tablet (8 mg total) by mouth every 8 (eight) hours as needed for nausea or vomiting. 30 tablet 1   polyethylene glycol (MIRALAX / GLYCOLAX) 17 g packet Take 17 g by mouth daily.     prochlorperazine (COMPAZINE) 10 MG tablet Take 1 tablet (10 mg total) by mouth every 6 (six) hours as needed for nausea or vomiting. 30 tablet 1   traMADol (ULTRAM) 50 MG tablet Take 1 tablet (50 mg total) by mouth every 6 (six) hours as needed. 30 tablet 0   triamcinolone (KENALOG) 0.1 % Apply 1 application  topically daily as needed (lichen planus flare).     No facility-administered medications prior to visit.    Review of Systems  Constitutional:  Positive for malaise/fatigue. Negative for chills, fever and weight loss.  HENT:  Negative for hearing loss, sore throat and tinnitus.   Eyes:  Negative for blurred vision and double vision.  Respiratory:  Positive for shortness of breath. Negative for cough, hemoptysis, sputum production, wheezing and stridor.   Cardiovascular:  Negative for chest pain, palpitations, orthopnea, leg swelling and PND.  Gastrointestinal:  Negative for abdominal pain, constipation, diarrhea, heartburn, nausea and vomiting.  Genitourinary:  Negative for dysuria, hematuria and urgency.  Musculoskeletal:  Negative for joint pain and myalgias.  Skin:  Negative for itching and rash.  Neurological:  Negative for dizziness, tingling, weakness and headaches.  Endo/Heme/Allergies:  Negative for  environmental allergies. Does not bruise/bleed easily.  Psychiatric/Behavioral:  Negative for depression. The patient is not nervous/anxious and does not have insomnia.   All other systems reviewed and are negative.    Objective:  Physical Exam Vitals reviewed.   Constitutional:      General: He is not in acute distress.    Appearance: He is well-developed. He is obese.  HENT:     Head: Normocephalic and atraumatic.  Eyes:     General: No scleral icterus.    Conjunctiva/sclera: Conjunctivae normal.     Pupils: Pupils are equal, round, and reactive to light.  Neck:     Vascular: No JVD.     Trachea: No tracheal deviation.  Cardiovascular:     Rate and Rhythm: Normal rate and regular rhythm.     Heart sounds: Normal heart sounds. No murmur heard. Pulmonary:     Effort: Pulmonary effort is normal. No tachypnea, accessory muscle usage or respiratory distress.     Breath sounds: No stridor. No wheezing, rhonchi or rales.  Abdominal:     Palpations: Abdomen is soft.  Musculoskeletal:        General: No tenderness.     Cervical back: Neck supple.  Lymphadenopathy:     Cervical: No cervical adenopathy.  Skin:    General: Skin is warm and dry.     Capillary Refill: Capillary refill takes less than 2 seconds.     Findings: No rash.  Neurological:     Mental Status: He is alert and oriented to person, place, and time.  Psychiatric:        Behavior: Behavior normal.      Vitals:   09/01/22 1130  BP: 120/80  Pulse: 75  SpO2: 99%  Weight: 256 lb (116.1 kg)  Height: 6' (1.829 m)   99% on RA BMI Readings from Last 3 Encounters:  09/01/22 34.72 kg/m  08/23/22 35.13 kg/m  08/23/22 34.58 kg/m   Wt Readings from Last 3 Encounters:  09/01/22 256 lb (116.1 kg)  08/23/22 259 lb (117.5 kg)  08/23/22 255 lb (115.7 kg)     CBC    Component Value Date/Time   WBC 7.8 08/23/2022 0823   WBC 5.9 07/28/2022 0435   RBC 3.71 (L) 08/23/2022 0823   HGB 11.1 (L) 08/23/2022 0823   HGB 12.6 (L) 02/20/2020 0858   HCT 34.4 (L) 08/23/2022 0823   HCT 36.6 (L) 02/20/2020 0858   PLT 146 (L) 08/23/2022 0823   PLT 279 02/20/2020 0858   MCV 92.7 08/23/2022 0823   MCV 86 02/20/2020 0858   MCH 29.9 08/23/2022 0823   MCHC 32.3 08/23/2022 0823    RDW 18.1 (H) 08/23/2022 0823   RDW 14.1 02/20/2020 0858   LYMPHSABS 2.4 08/23/2022 0823   LYMPHSABS 1.4 01/01/2020 1359   MONOABS 0.6 08/23/2022 0823   EOSABS 0.1 08/23/2022 0823   EOSABS 0.1 01/01/2020 1359   BASOSABS 0.0 08/23/2022 0823   BASOSABS 0.1 01/01/2020 1359    Chest Imaging: CXR - 2018 - normal   CT Chest 08/11/2020:  CT imaging was complete in April this revealed basilar subpleural reticulations and groundglass concern for possible ILD.  He also has some small airway disease.  I think after reviewing the information and his clinical picture he appears to be volume overloaded and this areas of groundglass and subpleural involvement likely are related to his positive fluid balance and possibly pulmonary edema. The patient's images have been independently reviewed by me.  Pulmonary Functions Testing Results:    Latest Ref Rng & Units 06/17/2021   11:59 AM 06/12/2019    2:52 PM  PFT Results  FVC-Pre L 4.34  4.00   FVC-Predicted Pre % 93  84   FVC-Post L 4.39  4.07   FVC-Predicted Post % 94  86   Pre FEV1/FVC % % 75  76   Post FEV1/FCV % % 78  73   FEV1-Pre L 3.28  3.04   FEV1-Predicted Pre % 96  87   FEV1-Post L 3.41  2.98   DLCO uncorrected ml/min/mmHg 19.79  24.05   DLCO UNC% % 73  87   DLCO corrected ml/min/mmHg 19.79    DLCO COR %Predicted % 73    DLVA Predicted % 76  94   TLC L 7.09  6.46   TLC % Predicted % 95  86   RV % Predicted % 99  87     FeNO: None   Pathology: None   Echocardiogram:   IMPRESSIONS   1. Left ventricular ejection fraction, by estimation, is 55 to 60%. The  left ventricle has normal function. The left ventricle has no regional  wall motion abnormalities. There is mild concentric left ventricular  hypertrophy. Left ventricular diastolic  function could not be evaluated.   2. Right ventricular systolic function is normal. The right ventricular  size is normal. Tricuspid regurgitation signal is inadequate for assessing  PA  pressure.   3. The mitral valve is grossly normal. Mild to moderate mitral valve  regurgitation. No evidence of mitral stenosis.   4. The aortic valve is tricuspid. There is moderate calcification of the  aortic valve. There is moderate thickening of the aortic valve. Aortic  valve regurgitation is not visualized. Mild to moderate aortic valve  sclerosis/calcification is present,  without any evidence of aortic stenosis.   5. Aortic dilatation noted. There is mild dilatation of the aortic root,  measuring 42 mm.   6. The inferior vena cava is dilated in size with >50% respiratory  variability, suggesting right atrial pressure of 8 mmHg.   Heart Catheterization: None      Assessment & Plan:     ICD-10-CM   1. Acute saddle pulmonary embolism with acute cor pulmonale (HCC)  I26.02     2. Mild persistent asthmatic bronchitis without complication  J45.30     3. OSA (obstructive sleep apnea)  G47.33     4. Immunocompromised state (HCC)  D84.9     5. Malignant neoplasm of overlapping sites of stomach (HCC)  C16.8       Discussion:  This is a 77 year old gentleman seen for evaluation initially for cough.  He has asthmatic bronchitis symptoms.  During this workup in the past several years he was ultimately diagnosed with stage IV gastric cancer.  Undergoing treatments with Dr. Truett Perna.  Recently admitted to the ER short of breath with a saddle pulmonary embolism and right-sided heart strain.  Plan: Due to his pulmonary embolism and history of malignancy will need to stay on anticoagulation lifetime. Continue Lovenox Continue Breo Continue albuterol as needed. He is respiratory symptoms are well-controlled.  He can see me in the future as needed. His Lovenox can to be managed by his oncologist. Return to clinic to see me as needed.    Current Outpatient Medications:    albuterol (VENTOLIN HFA) 108 (90 Base) MCG/ACT inhaler, Inhale 2 puffs into the lungs every 6 (six) hours as  needed for wheezing or shortness  of breath., Disp: 8 g, Rfl: 1   enoxaparin (LOVENOX) 120 MG/0.8ML injection, Inject 0.8 mLs (120 mg total) into the skin every 12 (twelve) hours., Disp: 48 mL, Rfl: 2   fluticasone (FLONASE) 50 MCG/ACT nasal spray, Place 1 spray into both nostrils daily as needed for allergies or rhinitis., Disp: , Rfl:    fluticasone furoate-vilanterol (BREO ELLIPTA) 100-25 MCG/ACT AEPB, Inhale 1 puff into the lungs daily., Disp: 60 each, Rfl: 6   hydroxychloroquine (PLAQUENIL) 200 MG tablet, Take 200 mg by mouth 2 (two) times daily., Disp: , Rfl:    ISOtretinoin (ACCUTANE) 40 MG capsule, Take 40 mg by mouth 2 (two) times daily. , Disp: , Rfl:    KLOR-CON M20 20 MEQ tablet, TAKE 1 TABLET BY MOUTH EVERY DAY, Disp: 90 tablet, Rfl: 1   levothyroxine (SYNTHROID) 112 MCG tablet, Take 112 mcg by mouth daily before breakfast., Disp: , Rfl:    lidocaine-prilocaine (EMLA) cream, Apply 1 Application topically as needed (Apply 1 hour before coming to Cancer Center for accessing)., Disp: 30 g, Rfl: 2   metoprolol succinate (TOPROL-XL) 50 MG 24 hr tablet, TAKE 1 TABLET BY MOUTH EVERY DAY, Disp: 90 tablet, Rfl: 3   montelukast (SINGULAIR) 10 MG tablet, Take 10 mg by mouth at bedtime., Disp: , Rfl:    omeprazole (PRILOSEC) 40 MG capsule, Take 40 mg by mouth daily., Disp: , Rfl:    ondansetron (ZOFRAN) 8 MG tablet, Take 1 tablet (8 mg total) by mouth every 8 (eight) hours as needed for nausea or vomiting., Disp: 30 tablet, Rfl: 1   polyethylene glycol (MIRALAX / GLYCOLAX) 17 g packet, Take 17 g by mouth daily., Disp: , Rfl:    prochlorperazine (COMPAZINE) 10 MG tablet, Take 1 tablet (10 mg total) by mouth every 6 (six) hours as needed for nausea or vomiting., Disp: 30 tablet, Rfl: 1   traMADol (ULTRAM) 50 MG tablet, Take 1 tablet (50 mg total) by mouth every 6 (six) hours as needed., Disp: 30 tablet, Rfl: 0   triamcinolone (KENALOG) 0.1 %, Apply 1 application  topically daily as needed (lichen  planus flare)., Disp: , Rfl:    Josephine Igo, DO [facility] 09/01/2022 11:50 AM

## 2022-09-01 NOTE — Patient Instructions (Addendum)
Thank you for visiting Dr. Tonia Brooms at [facility]. Today we recommend the following:  Stay on lovenox  Continue breo Albuterol as needed   Return if symptoms worsen or fail to improve, for with APP or Dr. Tonia Brooms.    Please do your part to reduce the spread of COVID-19.

## 2022-09-01 NOTE — Progress Notes (Signed)
Synopsis: Referred in Dec 2020 for sinus infections by Burton Apley, MD  Subjective:   PATIENT ID: Todd Harrison GENDER: male DOB: [birth year], MRN: [id number]  Chief Complaint  Patient presents with   Hospitalization Follow-up    F/up    PMH of recurrent sinus infectious and SOB/Cough. [name] was was sick at this time. Symptoms started 6 weeks ago. Usually if his sinuses are inflammated he has tooth pain. He didn't have it this time. He has been coughing now for >6 weeks. 4 weeks ago now he was up at night time with cough. His cough is 99% better since last week. Family members were swabbed for covid and were negative. He has not been tested for covid. Former smoker quit 1982, quit in 10 years, but smoked 4-5 ppd at the time.  At this time his cough is much better.  He has no chest pain.  Patient denies hemoptysis or dark sputum production.  He states that once he had this appointment made his symptoms started to improve slowly.  He has not ever had any pulmonary function test in the past.  He does have some sleep troubles.  He wakes up with a hoarse voice.  He gets up several times in the middle of the night to pee.  He does snore per his wife.  He has never had a sleep study.  Occupation: Patient is a Youth worker, upright base, electric bass and trombone.  OV 09/16/2020: Patient last seen by me in 2020.  Here today for follow-up.  Has subsequently been seen by Rikki Spearing.  Last seen in the office in January 2022.  Not taking any of his inhalers at this time.  He said he felt like he was doing better so he stopped taking his inhaler medications.  He is still taking his immunosuppression on occasion not taking it regularly for the lichen planus.  He is taking his Accutane regularly.  He did have a home sleep study that was completed that showed severe OSA but he does not wear this routinely.  He tries to wear it is much as he can at nighttime but tends to pull it off when he is asleep  and he finds it in the floor or disconnected when he wakes up.  His reflux is also not controlled but he does try to titrate medications to help with his reflux.  OV 06/17/2021: Here today for follow up after pfts. Review pfts results with patient. Cough is better. No recent exacerbations.  From a respiratory standpoint doing well.  Has no significant complaints today.  He does have issues ongoing with his medications for lichen planus.  His lower extremities are a little bit more swollen today than usual he says.  Lichen planus on his feet make mobility much more difficult as he gets older.  OV 07/03/2022: Here today for follow-up.  His cough symptoms have really improved over the past year however since November he was diagnosed with stage IV stomach cancer and has been undergoing treatments with medical oncology.  He has noticed over the past couple weeks he has had increased cough and sputum production.  Is been using his albuterol inhaler more.  He thinks that this is related to his swallowing ability.  His swallowing ability was evaluated in 2021 he was told he had aspiration risk.  He has noticed increased situations where he has cough after eating.  And has even expelled food before or after eating.    Past  Medical History:  Diagnosis Date   A-fib (HCC)    Asthma    Cataract    Gastric cancer (HCC)    GERD (gastroesophageal reflux disease)    Lichen planus    Methotrexate, long term, current use    no longer taking   Psoriasis    Sleep apnea    Thyroid disease    Urticaria      Family History  Problem Relation Age of Onset   Diabetes Mother    Stroke Mother    Leukemia Maternal Great-grandmother    Allergic rhinitis Neg Hx    Asthma Neg Hx    Eczema Neg Hx    Urticaria Neg Hx    Colon cancer Neg Hx    Esophageal cancer Neg Hx    Stomach cancer Neg Hx      Past Surgical History:  Procedure Laterality Date   CARDIOVERSION N/A 02/23/2020   Procedure: CARDIOVERSION;   Surgeon: Christell Constant, MD;  Location: [facility] ENDOSCOPY;  Service: Cardiovascular;  Laterality: N/A;   CARDIOVERSION N/A 04/12/2020   Procedure: CARDIOVERSION;  Surgeon: Jodelle Red, MD;  Location: [facility] ENDOSCOPY;  Service: Cardiovascular;  Laterality: N/A;   CATARACT EXTRACTION Left    2019   IR IMAGING GUIDED PORT INSERTION  05/03/2022   TONSILLECTOMY      Social History   Socioeconomic History   Marital status: Married    Spouse name: Not on file   Number of children: Not on file   Years of education: Not on file   Highest education level: Not on file  Occupational History   Not on file  Tobacco Use   Smoking status: Former    Types: Cigarettes    Quit date: 1982    Years since quitting: 42.3   Smokeless tobacco: Never  Vaping Use   Vaping Use: Not on file  Substance and Sexual Activity   Alcohol use: Yes    Alcohol/week: 1.0 standard drink of alcohol    Types: 1 Glasses of wine per week    Comment: occasionally   Drug use: No   Sexual activity: Not on file  Other Topics Concern   Not on file  Social History Narrative   Married with 1 son and 1 daughter   Former smoker no alcohol no caffeine no tobacco or drug use   Social Determinants of Health   Financial Resource Strain: Low Risk  (04/17/2022)   Overall Financial Resource Strain (CARDIA)    Difficulty of Paying Living Expenses: Not hard at all  Food Insecurity: No Food Insecurity (04/17/2022)   Hunger Vital Sign    Worried About Running Out of Food in the Last Year: Never true    Ran Out of Food in the Last Year: Never true  Transportation Needs: No Transportation Needs (04/17/2022)   PRAPARE - Administrator, Civil Service (Medical): No    Lack of Transportation (Non-Medical): No  Physical Activity: Not on file  Stress: No Stress Concern Present (04/17/2022)   [employer] of Occupational Health - Occupational Stress Questionnaire    Feeling of Stress : Not at all  Social  Connections: Not on file  Intimate Partner Violence: Not At Risk (04/17/2022)   Humiliation, Afraid, Rape, and Kick questionnaire    Fear of Current or Ex-Partner: No    Emotionally Abused: No    Physically Abused: No    Sexually Abused: No     Allergies  Allergen Reactions   Gold Itching  Mixed Grasses     unknown     Outpatient Medications Prior to Visit  Medication Sig Dispense Refill   albuterol (VENTOLIN HFA) 108 (90 Base) MCG/ACT inhaler Inhale 2 puffs into the lungs every 6 (six) hours as needed for wheezing or shortness of breath. 8 g 1   enoxaparin (LOVENOX) 120 MG/0.8ML injection Inject 0.8 mLs (120 mg total) into the skin every 12 (twelve) hours. 48 mL 2   fluticasone (FLONASE) 50 MCG/ACT nasal spray Place 1 spray into both nostrils daily as needed for allergies or rhinitis.     fluticasone furoate-vilanterol (BREO ELLIPTA) 100-25 MCG/ACT AEPB Inhale 1 puff into the lungs daily. 60 each 6   hydroxychloroquine (PLAQUENIL) 200 MG tablet Take 200 mg by mouth 2 (two) times daily.     ISOtretinoin (ACCUTANE) 40 MG capsule Take 40 mg by mouth 2 (two) times daily.      KLOR-CON M20 20 MEQ tablet TAKE 1 TABLET BY MOUTH EVERY DAY 90 tablet 1   levothyroxine (SYNTHROID) 112 MCG tablet Take 112 mcg by mouth daily before breakfast.     lidocaine-prilocaine (EMLA) cream Apply 1 Application topically as needed (Apply 1 hour before coming to [facility] for accessing). 30 g 2   metoprolol succinate (TOPROL-XL) 50 MG 24 hr tablet TAKE 1 TABLET BY MOUTH EVERY DAY 90 tablet 3   montelukast (SINGULAIR) 10 MG tablet Take 10 mg by mouth at bedtime.     omeprazole (PRILOSEC) 40 MG capsule Take 40 mg by mouth daily.     ondansetron (ZOFRAN) 8 MG tablet Take 1 tablet (8 mg total) by mouth every 8 (eight) hours as needed for nausea or vomiting. 30 tablet 1   polyethylene glycol (MIRALAX / GLYCOLAX) 17 g packet Take 17 g by mouth daily.     prochlorperazine (COMPAZINE) 10 MG tablet Take 1 tablet  (10 mg total) by mouth every 6 (six) hours as needed for nausea or vomiting. 30 tablet 1   traMADol (ULTRAM) 50 MG tablet Take 1 tablet (50 mg total) by mouth every 6 (six) hours as needed. 30 tablet 0   triamcinolone (KENALOG) 0.1 % Apply 1 application  topically daily as needed (lichen planus flare).     No facility-administered medications prior to visit.    Review of Systems  Constitutional:  Positive for malaise/fatigue. Negative for chills, fever and weight loss.  HENT:  Negative for hearing loss, sore throat and tinnitus.   Eyes:  Negative for blurred vision and double vision.  Respiratory:  Positive for cough and shortness of breath. Negative for hemoptysis, sputum production, wheezing and stridor.   Cardiovascular:  Negative for chest pain, palpitations, orthopnea, leg swelling and PND.  Gastrointestinal:  Positive for nausea. Negative for abdominal pain, constipation, diarrhea, heartburn and vomiting.  Genitourinary:  Negative for dysuria, hematuria and urgency.  Musculoskeletal:  Negative for joint pain and myalgias.  Skin:  Negative for itching and rash.  Neurological:  Negative for dizziness, tingling, weakness and headaches.  Endo/Heme/Allergies:  Negative for environmental allergies. Does not bruise/bleed easily.  Psychiatric/Behavioral:  Negative for depression. The patient is not nervous/anxious and does not have insomnia.   All other systems reviewed and are negative.    Objective:  Physical Exam Vitals reviewed.  Constitutional:      General: He is not in acute distress.    Appearance: He is well-developed. He is obese.  HENT:     Head: Normocephalic and atraumatic.  Eyes:     General: No  scleral icterus.    Conjunctiva/sclera: Conjunctivae normal.     Pupils: Pupils are equal, round, and reactive to light.  Neck:     Vascular: No JVD.     Trachea: No tracheal deviation.  Cardiovascular:     Rate and Rhythm: Normal rate and regular rhythm.     Heart sounds:  Normal heart sounds. No murmur heard. Pulmonary:     Effort: Pulmonary effort is normal. No tachypnea, accessory muscle usage or respiratory distress.     Breath sounds: No stridor. No wheezing, rhonchi or rales.  Abdominal:     General: There is no distension.     Palpations: Abdomen is soft.     Tenderness: There is no abdominal tenderness.  Musculoskeletal:        General: No tenderness.     Cervical back: Neck supple.  Lymphadenopathy:     Cervical: No cervical adenopathy.  Skin:    General: Skin is warm and dry.     Capillary Refill: Capillary refill takes less than 2 seconds.     Findings: No rash.  Neurological:     Mental Status: He is alert and oriented to person, place, and time.  Psychiatric:        Behavior: Behavior normal.      Vitals:   09/01/22 1130  BP: 120/80  Pulse: 75  SpO2: 99%  Weight: 256 lb (116.1 kg)  Height: 6' (1.829 m)   99% on RA BMI Readings from Last 3 Encounters:  09/01/22 34.72 kg/m  08/23/22 35.13 kg/m  08/23/22 34.58 kg/m   Wt Readings from Last 3 Encounters:  09/01/22 256 lb (116.1 kg)  08/23/22 259 lb (117.5 kg)  08/23/22 255 lb (115.7 kg)     CBC    Component Value Date/Time   WBC 7.8 08/23/2022 0823   WBC 5.9 07/28/2022 0435   RBC 3.71 (L) 08/23/2022 0823   HGB 11.1 (L) 08/23/2022 0823   HGB 12.6 (L) 02/20/2020 0858   HCT 34.4 (L) 08/23/2022 0823   HCT 36.6 (L) 02/20/2020 0858   PLT 146 (L) 08/23/2022 0823   PLT 279 02/20/2020 0858   MCV 92.7 08/23/2022 0823   MCV 86 02/20/2020 0858   MCH 29.9 08/23/2022 0823   MCHC 32.3 08/23/2022 0823   RDW 18.1 (H) 08/23/2022 0823   RDW 14.1 02/20/2020 0858   LYMPHSABS 2.4 08/23/2022 0823   LYMPHSABS 1.4 01/01/2020 1359   MONOABS 0.6 08/23/2022 0823   EOSABS 0.1 08/23/2022 0823   EOSABS 0.1 01/01/2020 1359   BASOSABS 0.0 08/23/2022 0823   BASOSABS 0.1 01/01/2020 1359    Chest Imaging: CXR - 2018 - normal   CT Chest 08/11/2020:  CT imaging was complete in April this  revealed basilar subpleural reticulations and groundglass concern for possible ILD.  He also has some small airway disease.  I think after reviewing the information and his clinical picture he appears to be volume overloaded and this areas of groundglass and subpleural involvement likely are related to his positive fluid balance and possibly pulmonary edema. The patient's images have been independently reviewed by me.     Pulmonary Functions Testing Results:    Latest Ref Rng & Units 06/17/2021   11:59 AM 06/12/2019    2:52 PM  PFT Results  FVC-Pre L 4.34  4.00   FVC-Predicted Pre % 93  84   FVC-Post L 4.39  4.07   FVC-Predicted Post % 94  86   Pre FEV1/FVC % % 75  76   Post FEV1/FCV % % 78  73   FEV1-Pre L 3.28  3.04   FEV1-Predicted Pre % 96  87   FEV1-Post L 3.41  2.98   DLCO uncorrected ml/min/mmHg 19.79  24.05   DLCO UNC% % 73  87   DLCO corrected ml/min/mmHg 19.79    DLCO COR %Predicted % 73    DLVA Predicted % 76  94   TLC L 7.09  6.46   TLC % Predicted % 95  86   RV % Predicted % 99  87     FeNO: None   Pathology: None   Echocardiogram:   IMPRESSIONS   1. Left ventricular ejection fraction, by estimation, is 55 to 60%. The  left ventricle has normal function. The left ventricle has no regional  wall motion abnormalities. There is mild concentric left ventricular  hypertrophy. Left ventricular diastolic  function could not be evaluated.   2. Right ventricular systolic function is normal. The right ventricular  size is normal. Tricuspid regurgitation signal is inadequate for assessing  PA pressure.   3. The mitral valve is grossly normal. Mild to moderate mitral valve  regurgitation. No evidence of mitral stenosis.   4. The aortic valve is tricuspid. There is moderate calcification of the  aortic valve. There is moderate thickening of the aortic valve. Aortic  valve regurgitation is not visualized. Mild to moderate aortic valve  sclerosis/calcification is present,   without any evidence of aortic stenosis.   5. Aortic dilatation noted. There is mild dilatation of the aortic root,  measuring 42 mm.   6. The inferior vena cava is dilated in size with >50% respiratory  variability, suggesting right atrial pressure of 8 mmHg.   Heart Catheterization: None      Assessment & Plan:   No diagnosis found.   Discussion:  This is a 77 year old gentleman seen initially for evaluation of cough.  He had mild persistent asthmatic bronchitis symptoms in the past was using as needed albuterol.  He has had evaluations with swallow studies in the past.  Was seen by SLP in 2021.  At that time was told had risk for aspiration.  Even now has experience with cough after swallowing and eating.  He has had pulmonary function test in the past with a reduced ERV likely related to body habitus normal ratio, normal spirometry normal TLC and DLCO that 73% predicted.  Plan: Working to start him on Breo 100 to see if this helps with some of his asthma symptoms. If the cough is related to recurrent aspiration into the airway I reviewed the SLP recommendations from 2021 with him. He is currently undergoing treatments for his gastric cancer. He is on a follow-up with [place] as needed in the future. Continue an inhaler regimen for the time being.  He is and let us know if this does not help. If his aspiration symptoms get worse I explained that we could talk about further options but he is not interested in ever having tube feeding.  And would always be interested in oral feeds.    Current Outpatient Medications:    albuterol (VENTOLIN HFA) 108 (90 Base) MCG/ACT inhaler, Inhale 2 puffs into the lungs every 6 (six) hours as needed for wheezing or shortness of breath., Disp: 8 g, Rfl: 1   enoxaparin (LOVENOX) 120 MG/0.8ML injection, Inject 0.8 mLs (120 mg total) into the skin every 12 (twelve) hours., Disp: 48 mL, Rfl: 2   fluticasone (FLONASE) 50 MCG/ACT  nasal spray, Place 1 spray into  both nostrils daily as needed for allergies or rhinitis., Disp: , Rfl:    fluticasone furoate-vilanterol (BREO ELLIPTA) 100-25 MCG/ACT AEPB, Inhale 1 puff into the lungs daily., Disp: 60 each, Rfl: 6   hydroxychloroquine (PLAQUENIL) 200 MG tablet, Take 200 mg by mouth 2 (two) times daily., Disp: , Rfl:    ISOtretinoin (ACCUTANE) 40 MG capsule, Take 40 mg by mouth 2 (two) times daily. , Disp: , Rfl:    KLOR-CON M20 20 MEQ tablet, TAKE 1 TABLET BY MOUTH EVERY DAY, Disp: 90 tablet, Rfl: 1   levothyroxine (SYNTHROID) 112 MCG tablet, Take 112 mcg by mouth daily before breakfast., Disp: , Rfl:    lidocaine-prilocaine (EMLA) cream, Apply 1 Application topically as needed (Apply 1 hour before coming to [facility] for accessing)., Disp: 30 g, Rfl: 2   metoprolol succinate (TOPROL-XL) 50 MG 24 hr tablet, TAKE 1 TABLET BY MOUTH EVERY DAY, Disp: 90 tablet, Rfl: 3   montelukast (SINGULAIR) 10 MG tablet, Take 10 mg by mouth at bedtime., Disp: , Rfl:    omeprazole (PRILOSEC) 40 MG capsule, Take 40 mg by mouth daily., Disp: , Rfl:    ondansetron (ZOFRAN) 8 MG tablet, Take 1 tablet (8 mg total) by mouth every 8 (eight) hours as needed for nausea or vomiting., Disp: 30 tablet, Rfl: 1   polyethylene glycol (MIRALAX / GLYCOLAX) 17 g packet, Take 17 g by mouth daily., Disp: , Rfl:    prochlorperazine (COMPAZINE) 10 MG tablet, Take 1 tablet (10 mg total) by mouth every 6 (six) hours as needed for nausea or vomiting., Disp: 30 tablet, Rfl: 1   traMADol (ULTRAM) 50 MG tablet, Take 1 tablet (50 mg total) by mouth every 6 (six) hours as needed., Disp: 30 tablet, Rfl: 0   triamcinolone (KENALOG) 0.1 %, Apply 1 application  topically daily as needed (lichen planus flare)., Disp: , Rfl:    Josephine Igo, DO  Pulmonary Critical Care 09/01/2022 11:43 AM

## 2022-09-03 ENCOUNTER — Other Ambulatory Visit: Payer: Self-pay | Admitting: Oncology

## 2022-09-03 DIAGNOSIS — C168 Malignant neoplasm of overlapping sites of stomach: Secondary | ICD-10-CM

## 2022-09-06 ENCOUNTER — Other Ambulatory Visit: Payer: Self-pay | Admitting: Pulmonary Disease

## 2022-09-06 ENCOUNTER — Inpatient Hospital Stay: Payer: Medicare HMO

## 2022-09-06 ENCOUNTER — Inpatient Hospital Stay: Payer: Medicare HMO | Admitting: Nurse Practitioner

## 2022-09-06 ENCOUNTER — Encounter: Payer: Self-pay | Admitting: Nurse Practitioner

## 2022-09-06 ENCOUNTER — Inpatient Hospital Stay: Payer: Medicare HMO | Attending: Nurse Practitioner

## 2022-09-06 VITALS — BP 108/61 | HR 71

## 2022-09-06 DIAGNOSIS — I82411 Acute embolism and thrombosis of right femoral vein: Secondary | ICD-10-CM | POA: Insufficient documentation

## 2022-09-06 DIAGNOSIS — C786 Secondary malignant neoplasm of retroperitoneum and peritoneum: Secondary | ICD-10-CM | POA: Insufficient documentation

## 2022-09-06 DIAGNOSIS — C168 Malignant neoplasm of overlapping sites of stomach: Secondary | ICD-10-CM | POA: Diagnosis not present

## 2022-09-06 DIAGNOSIS — E039 Hypothyroidism, unspecified: Secondary | ICD-10-CM | POA: Diagnosis not present

## 2022-09-06 DIAGNOSIS — I82441 Acute embolism and thrombosis of right tibial vein: Secondary | ICD-10-CM | POA: Insufficient documentation

## 2022-09-06 DIAGNOSIS — D6481 Anemia due to antineoplastic chemotherapy: Secondary | ICD-10-CM | POA: Insufficient documentation

## 2022-09-06 DIAGNOSIS — L439 Lichen planus, unspecified: Secondary | ICD-10-CM | POA: Diagnosis not present

## 2022-09-06 DIAGNOSIS — R0989 Other specified symptoms and signs involving the circulatory and respiratory systems: Secondary | ICD-10-CM

## 2022-09-06 DIAGNOSIS — I4891 Unspecified atrial fibrillation: Secondary | ICD-10-CM | POA: Insufficient documentation

## 2022-09-06 DIAGNOSIS — C169 Malignant neoplasm of stomach, unspecified: Secondary | ICD-10-CM | POA: Insufficient documentation

## 2022-09-06 DIAGNOSIS — Z86711 Personal history of pulmonary embolism: Secondary | ICD-10-CM | POA: Insufficient documentation

## 2022-09-06 DIAGNOSIS — Z5111 Encounter for antineoplastic chemotherapy: Secondary | ICD-10-CM | POA: Diagnosis present

## 2022-09-06 DIAGNOSIS — R058 Other specified cough: Secondary | ICD-10-CM

## 2022-09-06 LAB — CBC WITH DIFFERENTIAL (CANCER CENTER ONLY)
Abs Immature Granulocytes: 0.03 10*3/uL (ref 0.00–0.07)
Basophils Absolute: 0.1 10*3/uL (ref 0.0–0.1)
Basophils Relative: 1 %
Eosinophils Absolute: 0.1 10*3/uL (ref 0.0–0.5)
Eosinophils Relative: 1 %
HCT: 33.2 % — ABNORMAL LOW (ref 39.0–52.0)
Hemoglobin: 10.8 g/dL — ABNORMAL LOW (ref 13.0–17.0)
Immature Granulocytes: 1 %
Lymphocytes Relative: 28 %
Lymphs Abs: 1.7 10*3/uL (ref 0.7–4.0)
MCH: 30.3 pg (ref 26.0–34.0)
MCHC: 32.5 g/dL (ref 30.0–36.0)
MCV: 93 fL (ref 80.0–100.0)
Monocytes Absolute: 0.7 10*3/uL (ref 0.1–1.0)
Monocytes Relative: 11 %
Neutro Abs: 3.6 10*3/uL (ref 1.7–7.7)
Neutrophils Relative %: 58 %
Platelet Count: 142 10*3/uL — ABNORMAL LOW (ref 150–400)
RBC: 3.57 MIL/uL — ABNORMAL LOW (ref 4.22–5.81)
RDW: 17.6 % — ABNORMAL HIGH (ref 11.5–15.5)
WBC Count: 6.2 10*3/uL (ref 4.0–10.5)
nRBC: 0 % (ref 0.0–0.2)

## 2022-09-06 LAB — CMP (CANCER CENTER ONLY)
ALT: 9 U/L (ref 0–44)
AST: 13 U/L — ABNORMAL LOW (ref 15–41)
Albumin: 3.5 g/dL (ref 3.5–5.0)
Alkaline Phosphatase: 45 U/L (ref 38–126)
Anion gap: 8 (ref 5–15)
BUN: 11 mg/dL (ref 8–23)
CO2: 24 mmol/L (ref 22–32)
Calcium: 8.6 mg/dL — ABNORMAL LOW (ref 8.9–10.3)
Chloride: 108 mmol/L (ref 98–111)
Creatinine: 0.89 mg/dL (ref 0.61–1.24)
GFR, Estimated: >60 mL/min (ref >60)
Glucose, Bld: 91 mg/dL (ref 70–99)
Potassium: 3.4 mmol/L — ABNORMAL LOW (ref 3.5–5.1)
Sodium: 140 mmol/L (ref 135–145)
Total Bilirubin: 0.5 mg/dL (ref 0.3–1.2)
Total Protein: 5.8 g/dL — ABNORMAL LOW (ref 6.5–8.1)

## 2022-09-06 MED ORDER — FLUOROURACIL CHEMO INJECTION 2.5 GM/50ML
400.0000 mg/m2 | Freq: Once | INTRAVENOUS | Status: AC
  Administered 2022-09-06: 1000 mg via INTRAVENOUS
  Filled 2022-09-06: qty 20

## 2022-09-06 MED ORDER — SODIUM CHLORIDE 0.9 % IV SOLN
2000.0000 mg/m2 | INTRAVENOUS | Status: DC
  Administered 2022-09-06: 5000 mg via INTRAVENOUS
  Filled 2022-09-06: qty 100

## 2022-09-06 MED ORDER — SODIUM CHLORIDE 0.9 % IV SOLN
10.0000 mg | Freq: Once | INTRAVENOUS | Status: AC
  Administered 2022-09-06: 10 mg via INTRAVENOUS
  Filled 2022-09-06: qty 10

## 2022-09-06 MED ORDER — LEUCOVORIN CALCIUM INJECTION 350 MG
400.0000 mg/m2 | Freq: Once | INTRAVENOUS | Status: DC
  Filled 2022-09-06: qty 47.8

## 2022-09-06 MED ORDER — PALONOSETRON HCL INJECTION 0.25 MG/5ML
0.2500 mg | Freq: Once | INTRAVENOUS | Status: AC
  Administered 2022-09-06: 0.25 mg via INTRAVENOUS
  Filled 2022-09-06: qty 5

## 2022-09-06 MED ORDER — DEXTROSE 5 % IV SOLN: Freq: Once | INTRAVENOUS | Status: AC

## 2022-09-06 MED ORDER — LEUCOVORIN CALCIUM INJECTION 350 MG
400.0000 mg/m2 | Freq: Once | INTRAVENOUS | Status: AC
  Administered 2022-09-06: 956 mg via INTRAVENOUS
  Filled 2022-09-06: qty 47.8

## 2022-09-06 MED ORDER — OXALIPLATIN CHEMO INJECTION 100 MG/20ML
65.0000 mg/m2 | Freq: Once | INTRAVENOUS | Status: AC
  Administered 2022-09-06: 150 mg via INTRAVENOUS
  Filled 2022-09-06: qty 20

## 2022-09-06 NOTE — Patient Instructions (Addendum)
[facility]   The chemotherapy medication bag should finish at 46 hours, 96 hours, or 7 days. For example, if your pump is scheduled for 46 hours and it was put on at 4:00 p.m., it should finish at 2:00 p.m. the day it is scheduled to come off regardless of your appointment time.     Estimated time to finish at 12:00 Friday, Sep 08, 2022.   If the display on your pump reads "Low Volume" and it is beeping, take the batteries out of the pump and come to the cancer center for it to be taken off.   If the pump alarms go off prior to the pump reading "Low Volume" then call [phone] and someone can assist you.  If the plunger comes out and the chemotherapy medication is leaking out, please use your home chemo spill kit to clean up the spill. Do NOT use paper towels or other household products.  If you have problems or questions regarding your pump, please call either [phone] (24 hours a day) or the cancer center Monday-Friday 8:00 a.m.- 4:30 p.m. at the clinic number and we will assist you. If you are unable to get assistance, then go to the nearest Emergency Department and ask the staff to contact the IV team for assistance.  Discharge Instructions: Thank you for choosing [facility] to provide your oncology and hematology care.   If you have a lab appointment with the Cancer Center, please go directly to the Cancer Center and check in at the registration area.   Wear comfortable clothing and clothing appropriate for easy access to any Portacath or PICC line.   We strive to give you quality time with your provider. You may need to reschedule your appointment if you arrive late (15 or more minutes).  Arriving late affects you and other patients whose appointments are after yours.  Also, if you miss three or more appointments without notifying the office, you may be dismissed from the clinic at the provider's discretion.      For prescription  refill requests, have your pharmacy contact our office and allow 72 hours for refills to be completed.    Today you received the following chemotherapy and/or immunotherapy agents Oxaliplatin, Leucovorin, Fluorouracil.      To help prevent nausea and vomiting after your treatment, we encourage you to take your nausea medication as directed.  BELOW ARE SYMPTOMS THAT SHOULD BE REPORTED IMMEDIATELY: *FEVER GREATER THAN 100.4 F (38 C) OR HIGHER *CHILLS OR SWEATING *NAUSEA AND VOMITING THAT IS NOT CONTROLLED WITH YOUR NAUSEA MEDICATION *UNUSUAL SHORTNESS OF BREATH *UNUSUAL BRUISING OR BLEEDING *URINARY PROBLEMS (pain or burning when urinating, or frequent urination) *BOWEL PROBLEMS (unusual diarrhea, constipation, pain near the anus) TENDERNESS IN MOUTH AND THROAT WITH OR WITHOUT PRESENCE OF ULCERS (sore throat, sores in mouth, or a toothache) UNUSUAL RASH, SWELLING OR PAIN  UNUSUAL VAGINAL DISCHARGE OR ITCHING   Items with * indicate a potential emergency and should be followed up as soon as possible or go to the Emergency Department if any problems should occur.  Please show the CHEMOTHERAPY ALERT CARD or IMMUNOTHERAPY ALERT CARD at check-in to the Emergency Department and triage nurse.  Should you have questions after your visit or need to cancel or reschedule your appointment, please contact [facility]  Dept: [phone]  and follow the prompts.  Office hours are 8:00 a.m. to 4:30 p.m. Monday - Friday. Please note  that voicemails left after 4:00 p.m. may not be returned until the following business day.  We are closed weekends and major holidays. You have access to a nurse at all times for urgent questions. Please call the main number to the clinic Dept: [phone] and follow the prompts.   For any non-urgent questions, you may also contact your provider using MyChart. We now offer e-Visits for anyone 59 and older to request care online for non-urgent  symptoms. For details visit [email or web address].   Also download the MyChart app! Go to the app store, search "MyChart", open the app, select [facility], and log in with your MyChart username and password.  Oxaliplatin Injection What is this medication? OXALIPLATIN (ox AL i PLA tin) treats colorectal cancer. It works by slowing down the growth of cancer cells. This medicine may be used for other purposes; ask your health care provider or pharmacist if you have questions. COMMON BRAND NAME(S): Eloxatin What should I tell my care team before I take this medication? They need to know if you have any of these conditions: Heart disease History of irregular heartbeat or rhythm Liver disease Low blood cell levels (white cells, red cells, and platelets) Lung or breathing disease, such as asthma Take medications that treat or prevent blood clots Tingling of the fingers, toes, or other nerve disorder An unusual or allergic reaction to oxaliplatin, other medications, foods, dyes, or preservatives If you or your partner are pregnant or trying to get pregnant Breast-feeding How should I use this medication? This medication is injected into a vein. It is given by your care team in a hospital or clinic setting. Talk to your care team about the use of this medication in children. Special care may be needed. Overdosage: If you think you have taken too much of this medicine contact a poison control center or emergency room at once. NOTE: This medicine is only for you. Do not share this medicine with others. What if I miss a dose? Keep appointments for follow-up doses. It is important not to miss a dose. Call your care team if you are unable to keep an appointment. What may interact with this medication? Do not take this medication with any of the following: Cisapride Dronedarone Pimozide Thioridazine This medication may also interact with the following: Aspirin and aspirin-like  medications Certain medications that treat or prevent blood clots, such as warfarin, apixaban, dabigatran, and rivaroxaban Cisplatin Cyclosporine Diuretics Medications for infection, such as acyclovir, adefovir, amphotericin B, bacitracin, cidofovir, foscarnet, ganciclovir, gentamicin, pentamidine, vancomycin NSAIDs, medications for pain and inflammation, such as ibuprofen or naproxen Other medications that cause heart rhythm changes Pamidronate Zoledronic acid This list may not describe all possible interactions. Give your health care provider a list of all the medicines, herbs, non-prescription drugs, or dietary supplements you use. Also tell them if you smoke, drink alcohol, or use illegal drugs. Some items may interact with your medicine. What should I watch for while using this medication? Your condition will be monitored carefully while you are receiving this medication. You may need blood work while taking this medication. This medication may make you feel generally unwell. This is not uncommon as chemotherapy can affect healthy cells as well as cancer cells. Report any side effects. Continue your course of treatment even though you feel ill unless your care team tells you to stop. This medication may increase your risk of getting an infection. Call your care team for advice if you get a fever, chills, sore throat, or  other symptoms of a cold or flu. Do not treat yourself. Try to avoid being around people who are sick. Avoid taking medications that contain aspirin, acetaminophen, ibuprofen, naproxen, or ketoprofen unless instructed by your care team. These medications may hide a fever. Be careful brushing or flossing your teeth or using a toothpick because you may get an infection or bleed more easily. If you have any dental work done, tell your dentist you are receiving this medication. This medication can make you more sensitive to cold. Do not drink cold drinks or use ice. Cover exposed  skin before coming in contact with cold temperatures or cold objects. When out in cold weather wear warm clothing and cover your mouth and nose to warm the air that goes into your lungs. Tell your care team if you get sensitive to the cold. Talk to your care team if you or your partner are pregnant or think either of you might be pregnant. This medication can cause serious birth defects if taken during pregnancy and for 9 months after the last dose. A negative pregnancy test is required before starting this medication. A reliable form of contraception is recommended while taking this medication and for 9 months after the last dose. Talk to your care team about effective forms of contraception. Do not father a child while taking this medication and for 6 months after the last dose. Use a condom while having sex during this time period. Do not breastfeed while taking this medication and for 3 months after the last dose. This medication may cause infertility. Talk to your care team if you are concerned about your fertility. What side effects may I notice from receiving this medication? Side effects that you should report to your care team as soon as possible: Allergic reactions--skin rash, itching, hives, swelling of the face, lips, tongue, or throat Bleeding--bloody or black, tar-like stools, vomiting blood or brown material that looks like coffee grounds, red or dark brown urine, small red or purple spots on skin, unusual bruising or bleeding Dry cough, shortness of breath or trouble breathing Heart rhythm changes--fast or irregular heartbeat, dizziness, feeling faint or lightheaded, chest pain, trouble breathing Infection--fever, chills, cough, sore throat, wounds that don't heal, pain or trouble when passing urine, general feeling of discomfort or being unwell Liver injury--right upper belly pain, loss of appetite, nausea, light-colored stool, dark yellow or brown urine, yellowing skin or eyes, unusual  weakness or fatigue Low red blood cell level--unusual weakness or fatigue, dizziness, headache, trouble breathing Muscle injury--unusual weakness or fatigue, muscle pain, dark yellow or brown urine, decrease in amount of urine Pain, tingling, or numbness in the hands or feet Sudden and severe headache, confusion, change in vision, seizures, which may be signs of posterior reversible encephalopathy syndrome (PRES) Unusual bruising or bleeding Side effects that usually do not require medical attention (report to your care team if they continue or are bothersome): Diarrhea Nausea Pain, redness, or swelling with sores inside the mouth or throat Unusual weakness or fatigue Vomiting This list may not describe all possible side effects. Call your doctor for medical advice about side effects. You may report side effects to FDA at 1-800-FDA-1088. Where should I keep my medication? This medication is given in a hospital or clinic. It will not be stored at home. NOTE: This sheet is a summary. It may not cover all possible information. If you have questions about this medicine, talk to your doctor, pharmacist, or health care provider.  2023 Elsevier/Gold Standard (  2007-06-15 00:00:00)  Leucovorin Injection What is this medication? LEUCOVORIN (loo koe VOR in) prevents side effects from certain medications, such as methotrexate. It works by increasing folate levels. This helps protect healthy cells in your body. It may also be used to treat anemia caused by low levels of folate. It can also be used with fluorouracil, a type of chemotherapy, to treat colorectal cancer. It works by increasing the effects of fluorouracil in the body. This medicine may be used for other purposes; ask your health care provider or pharmacist if you have questions. What should I tell my care team before I take this medication? They need to know if you have any of these conditions: Anemia from low levels of vitamin B12 in the  blood An unusual or allergic reaction to leucovorin, folic acid, other medications, foods, dyes, or preservatives Pregnant or trying to get pregnant Breastfeeding How should I use this medication? This medication is injected into a vein or a muscle. It is given by your care team in a hospital or clinic setting. Talk to your care team about the use of this medication in children. Special care may be needed. Overdosage: If you think you have taken too much of this medicine contact a poison control center or emergency room at once. NOTE: This medicine is only for you. Do not share this medicine with others. What if I miss a dose? Keep appointments for follow-up doses. It is important not to miss your dose. Call your care team if you are unable to keep an appointment. What may interact with this medication? Capecitabine Fluorouracil Phenobarbital Phenytoin Primidone Trimethoprim;sulfamethoxazole This list may not describe all possible interactions. Give your health care provider a list of all the medicines, herbs, non-prescription drugs, or dietary supplements you use. Also tell them if you smoke, drink alcohol, or use illegal drugs. Some items may interact with your medicine. What should I watch for while using this medication? Your condition will be monitored carefully while you are receiving this medication. This medication may increase the side effects of 5-fluorouracil. Tell your care team if you have diarrhea or mouth sores that do not get better or that get worse. What side effects may I notice from receiving this medication? Side effects that you should report to your care team as soon as possible: Allergic reactions--skin rash, itching, hives, swelling of the face, lips, tongue, or throat This list may not describe all possible side effects. Call your doctor for medical advice about side effects. You may report side effects to FDA at 1-800-FDA-1088. Where should I keep my  medication? This medication is given in a hospital or clinic. It will not be stored at home. NOTE: This sheet is a summary. It may not cover all possible information. If you have questions about this medicine, talk to your doctor, pharmacist, or health care provider.  2023 Elsevier/Gold Standard (2021-09-02 00:00:00)  Fluorouracil Injection What is this medication? FLUOROURACIL (flure oh YOOR a sil) treats some types of cancer. It works by slowing down the growth of cancer cells. This medicine may be used for other purposes; ask your health care provider or pharmacist if you have questions. COMMON BRAND NAME(S): Adrucil What should I tell my care team before I take this medication? They need to know if you have any of these conditions: Blood disorders Dihydropyrimidine dehydrogenase (DPD) deficiency Infection, such as chickenpox, cold sores, herpes Kidney disease Liver disease Poor nutrition Recent or ongoing radiation therapy An unusual or allergic reaction to  fluorouracil, other medications, foods, dyes, or preservatives If you or your partner are pregnant or trying to get pregnant Breast-feeding How should I use this medication? This medication is injected into a vein. It is administered by your care team in a hospital or clinic setting. Talk to your care team about the use of this medication in children. Special care may be needed. Overdosage: If you think you have taken too much of this medicine contact a poison control center or emergency room at once. NOTE: This medicine is only for you. Do not share this medicine with others. What if I miss a dose? Keep appointments for follow-up doses. It is important not to miss your dose. Call your care team if you are unable to keep an appointment. What may interact with this medication? Do not take this medication with any of the following: Live virus vaccines This medication may also interact with the following: Medications that treat  or prevent blood clots, such as warfarin, enoxaparin, dalteparin This list may not describe all possible interactions. Give your health care provider a list of all the medicines, herbs, non-prescription drugs, or dietary supplements you use. Also tell them if you smoke, drink alcohol, or use illegal drugs. Some items may interact with your medicine. What should I watch for while using this medication? Your condition will be monitored carefully while you are receiving this medication. This medication may make you feel generally unwell. This is not uncommon as chemotherapy can affect healthy cells as well as cancer cells. Report any side effects. Continue your course of treatment even though you feel ill unless your care team tells you to stop. In some cases, you may be given additional medications to help with side effects. Follow all directions for their use. This medication may increase your risk of getting an infection. Call your care team for advice if you get a fever, chills, sore throat, or other symptoms of a cold or flu. Do not treat yourself. Try to avoid being around people who are sick. This medication may increase your risk to bruise or bleed. Call your care team if you notice any unusual bleeding. Be careful brushing or flossing your teeth or using a toothpick because you may get an infection or bleed more easily. If you have any dental work done, tell your dentist you are receiving this medication. Avoid taking medications that contain aspirin, acetaminophen, ibuprofen, naproxen, or ketoprofen unless instructed by your care team. These medications may hide a fever. Do not treat diarrhea with over the counter products. Contact your care team if you have diarrhea that lasts more than 2 days or if it is severe and watery. This medication can make you more sensitive to the sun. Keep out of the sun. If you cannot avoid being in the sun, wear protective clothing and sunscreen. Do not use sun lamps,  tanning beds, or tanning booths. Talk to your care team if you or your partner wish to become pregnant or think you might be pregnant. This medication can cause serious birth defects if taken during pregnancy and for 3 months after the last dose. A reliable form of contraception is recommended while taking this medication and for 3 months after the last dose. Talk to your care team about effective forms of contraception. Do not father a child while taking this medication and for 3 months after the last dose. Use a condom while having sex during this time period. Do not breastfeed while taking this medication. This medication  may cause infertility. Talk to your care team if you are concerned about your fertility. What side effects may I notice from receiving this medication? Side effects that you should report to your care team as soon as possible: Allergic reactions--skin rash, itching, hives, swelling of the face, lips, tongue, or throat Heart attack--pain or tightness in the chest, shoulders, arms, or jaw, nausea, shortness of breath, cold or clammy skin, feeling faint or lightheaded Heart failure--shortness of breath, swelling of the ankles, feet, or hands, sudden weight gain, unusual weakness or fatigue Heart rhythm changes--fast or irregular heartbeat, dizziness, feeling faint or lightheaded, chest pain, trouble breathing High ammonia level--unusual weakness or fatigue, confusion, loss of appetite, nausea, vomiting, seizures Infection--fever, chills, cough, sore throat, wounds that don't heal, pain or trouble when passing urine, general feeling of discomfort or being unwell Low red blood cell level--unusual weakness or fatigue, dizziness, headache, trouble breathing Pain, tingling, or numbness in the hands or feet, muscle weakness, change in vision, confusion or trouble speaking, loss of balance or coordination, trouble walking, seizures Redness, swelling, and blistering of the skin over hands and  feet Severe or prolonged diarrhea Unusual bruising or bleeding Side effects that usually do not require medical attention (report to your care team if they continue or are bothersome): Dry skin Headache Increased tears Nausea Pain, redness, or swelling with sores inside the mouth or throat Sensitivity to light Vomiting This list may not describe all possible side effects. Call your doctor for medical advice about side effects. You may report side effects to FDA at 1-800-FDA-1088. Where should I keep my medication? This medication is given in a hospital or clinic. It will not be stored at home. NOTE: This sheet is a summary. It may not cover all possible information. If you have questions about this medicine, talk to your doctor, pharmacist, or health care provider.  2023 Elsevier/Gold Standard (2021-08-23 00:00:00)

## 2022-09-06 NOTE — Progress Notes (Signed)
  [facility] OFFICE PROGRESS NOTE   Diagnosis: Gastric cancer  INTERVAL HISTORY:   Todd Harrison returns as scheduled.  He completed cycle 8 FOLFOX 08/23/2022.  No nausea or vomiting around the time of chemotherapy.  2 episodes of vomiting last week.  No mouth sores.  Intermittent loose stools.  Cold sensitivity lasted approximately 10 days.  No persistent neuropathy symptoms.  Objective:  Vital signs in last 24 hours:  Blood pressure 99/71, pulse 71, temperature 98.1 F (36.7 C), temperature source Oral, resp. rate 20, height 6' (1.829 m), weight 257 lb 12.8 oz (116.9 kg), SpO2 98 %.    HEENT: No thrush or ulcers. Resp: Lungs clear bilaterally. Cardio: Regular rate and rhythm. GI: Abdomen soft and nontender, mildly distended.  No hepatomegaly.  No mass. Vascular: Trace edema lower leg bilaterally, right slightly greater than left. Neuro: Mild decrease in vibratory sense at the fingertips bilaterally. Skin: Palms without erythema. Port-A-Cath without erythema.  Lab Results:  Lab Results  Component Value Date   WBC 6.2 09/06/2022   HGB 10.8 (L) 09/06/2022   HCT 33.2 (L) 09/06/2022   MCV 93.0 09/06/2022   PLT 142 (L) 09/06/2022   NEUTROABS 3.6 09/06/2022    Imaging:  No results found.  Medications: I have reviewed the patient's current medications.  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  Internal hemorrhoids.  No specimens collected. Upper endoscopy 04/25/2022-large infiltrative mass with oozing  bleeding and stigmata of recent bleeding in the gastric fundus, on the anterior wall of the gastric body and on the greater curvature of the gastric body; deformity in the gastric antrum.  Extrinsic deformity in the entire duodenum.  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 Her2 by IHC negative, 1+; MMR IHC normal; PD-L1 CPS 1%, positive.  Cycle 1 FOLFOX 05/04/2022 Cycle 2 FOLFOX 05/17/2022 Cycle 3 FOLFOX 05/31/2022-oxaliplatin dose reduced secondary to prolonged cold sensitivity Cycle 4 FOLFOX 06/14/2022 Cycle 5 FOLFOX 06/28/2022 CT abdomen/pelvis 07/08/2022-asymmetric wall thickening at the lesser curvature of the stomach, decreased ascites, increased diffuse omental and peritoneal nodularity Cycle 6 FOLFOX 07/12/2022 Cycle 7 FOLFOX 08/09/2022 Cycle 8 FOLFOX 08/23/2022 Cycle 9 FOLFOX 09/06/2022 Truncal rash-status post recent punch biopsy with pathology pending; biopsy reported negative per family 04/26/2022 Lichen planus of the feet Hypothyroidism Atrial fibrillation Admission 07/24/2022 with acute bilateral pulmonary embolism/right heart strain Heparin anticoagulation 07/24/2022 Doppler 07/24/2022-acute DVT of the right femoral, popliteal, posterior tibial, and peroneal veins, acute left posterior tibial DVT 7.  Anemia secondary to chemotherapy, phlebotomy, and hemoptysis-improved  Disposition: [name] appears stable.  He has completed 8 cycles of FOLFOX.  Plan to proceed with cycle 9 today as scheduled.  Restaging CTs next week.  CBC and chemistry panel reviewed.  Labs adequate to proceed as above.  He will return for follow-up in 1 week to review CT scans.  He will contact the office in the interim with any problems.    Lonna Cobb ANP/GNP-BC   09/06/2022  9:18 AM

## 2022-09-06 NOTE — Progress Notes (Signed)
Patient seen by Lisa Thomas NP today  Vitals are within treatment parameters.  Labs reviewed by Lisa Thomas NP and are within treatment parameters.  Per physician team, patient is ready for treatment and there are NO modifications to the treatment plan.     

## 2022-09-07 ENCOUNTER — Telehealth: Payer: Self-pay | Admitting: Oncology

## 2022-09-08 ENCOUNTER — Inpatient Hospital Stay: Payer: Medicare HMO

## 2022-09-08 VITALS — BP 94/64 | HR 68 | Temp 97.7°F | Resp 20

## 2022-09-08 DIAGNOSIS — C168 Malignant neoplasm of overlapping sites of stomach: Secondary | ICD-10-CM

## 2022-09-08 DIAGNOSIS — Z5111 Encounter for antineoplastic chemotherapy: Secondary | ICD-10-CM | POA: Diagnosis not present

## 2022-09-08 MED ORDER — SODIUM CHLORIDE 0.9% FLUSH
10.0000 mL | INTRAVENOUS | Status: DC | PRN
  Administered 2022-09-08: 10 mL

## 2022-09-08 MED ORDER — HEPARIN SOD (PORK) LOCK FLUSH 100 UNIT/ML IV SOLN
500.0000 [IU] | Freq: Once | INTRAVENOUS | Status: AC | PRN
  Administered 2022-09-08: 500 [IU]

## 2022-09-08 NOTE — Patient Instructions (Signed)

## 2022-09-11 ENCOUNTER — Ambulatory Visit (HOSPITAL_BASED_OUTPATIENT_CLINIC_OR_DEPARTMENT_OTHER)
Admission: RE | Admit: 2022-09-11 | Discharge: 2022-09-11 | Disposition: A | Discharge status: Home / Self Care | Payer: Medicare HMO | Source: Ambulatory Visit | Attending: Nurse Practitioner | Admitting: Nurse Practitioner

## 2022-09-11 DIAGNOSIS — C168 Malignant neoplasm of overlapping sites of stomach: Secondary | ICD-10-CM

## 2022-09-11 MED ORDER — IOHEXOL 300 MG/ML  SOLN
100.0000 mL | Freq: Once | INTRAMUSCULAR | Status: AC | PRN
  Administered 2022-09-11: 100 mL via INTRAVENOUS

## 2022-09-12 IMAGING — CT CT CHEST W/O CM
2 of 4 series · 15 of 36 positions shown, 18 images · non-contrast
Comparison: Radiograph same day

CLINICAL DATA: Persistent cough

EXAM:
CT CHEST WITHOUT CONTRAST
TECHNIQUE: Multidetector CT imaging of the chest was performed following the
standard protocol without IV contrast.

[Series 5: thorax 2.0 · axial · 0.94mm/px · z∈[+1168,+1478]mm · 12 of 175 slices shown, 15 images]
[im 10/175  mediastinal]
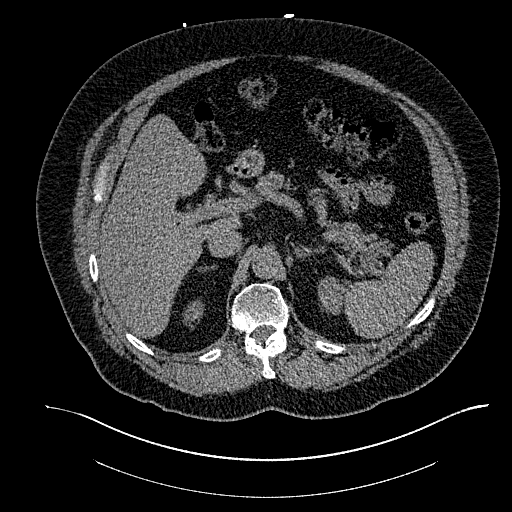
[im 10/175  lung]
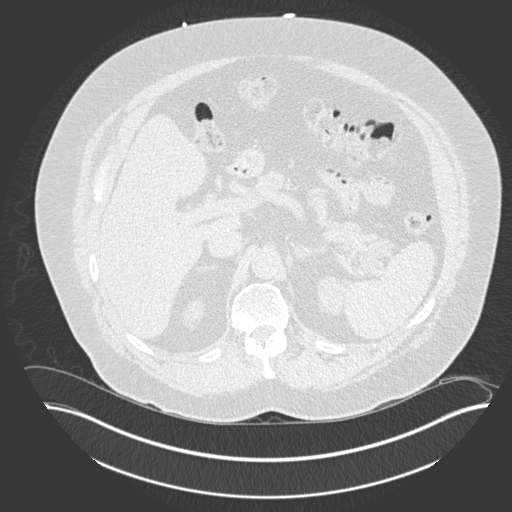
[im 28/175  lung]
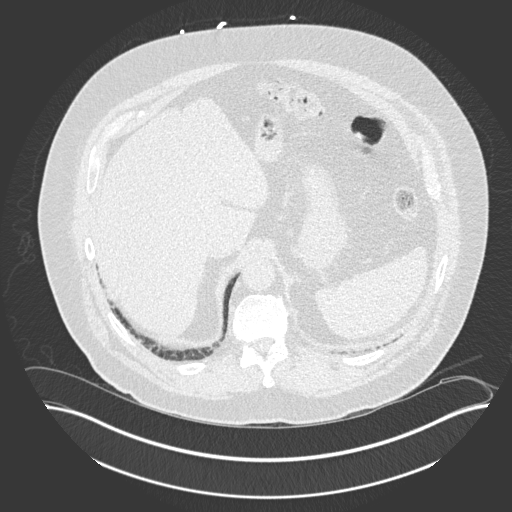
[im 37/175  lung]
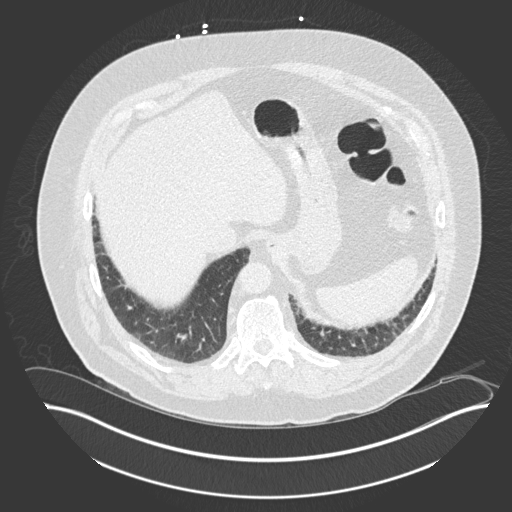
[im 55/175  lung]
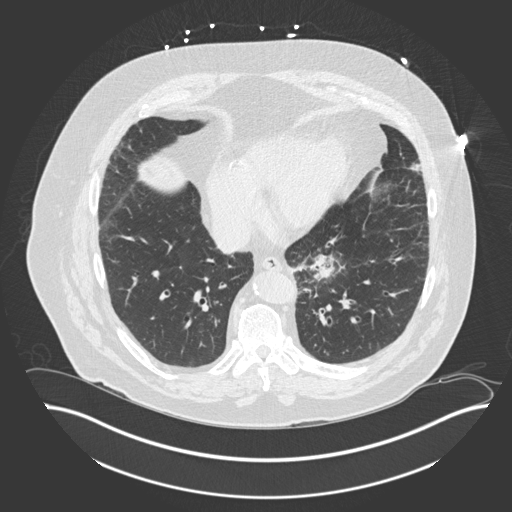
[im 65/175  mediastinal]
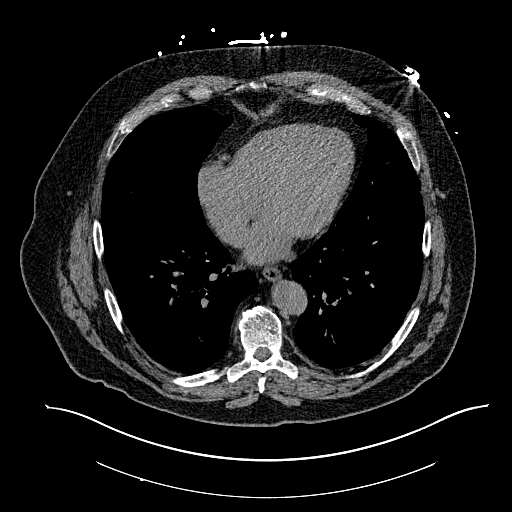
[im 65/175  lung]
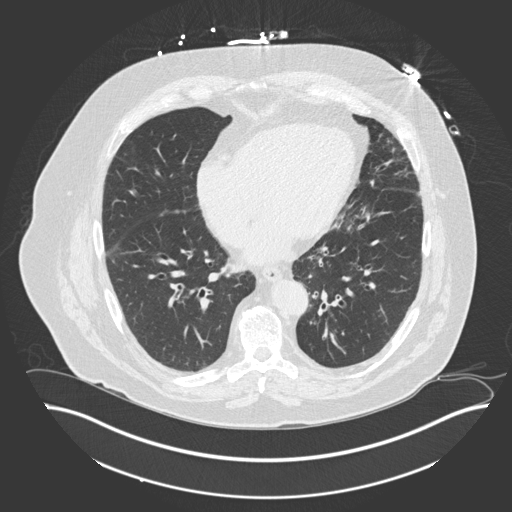
[im 83/175  lung]
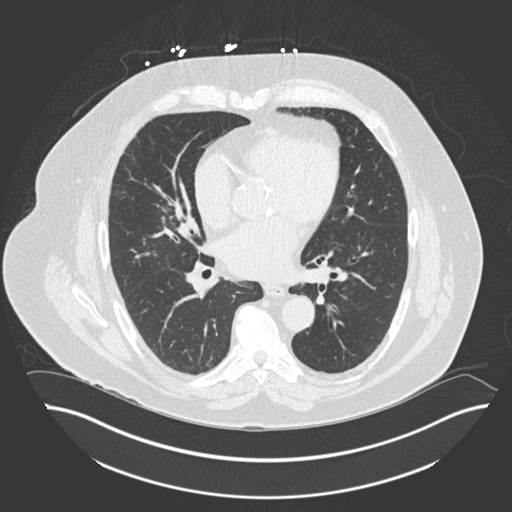
[im 92/175  lung]
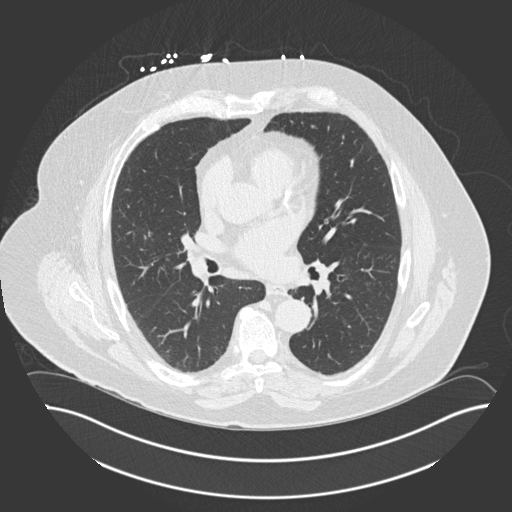
[im 110/175  lung]
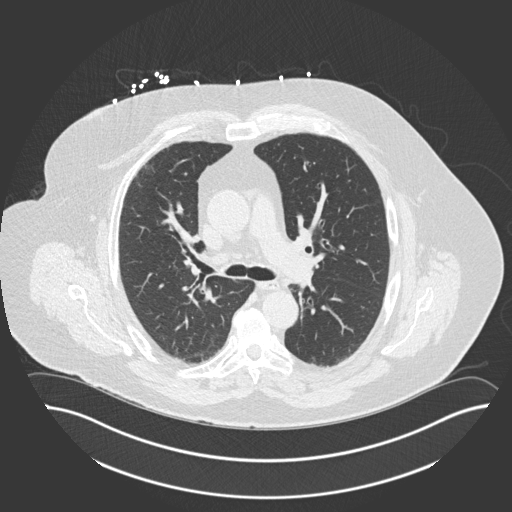
[im 120/175  mediastinal]
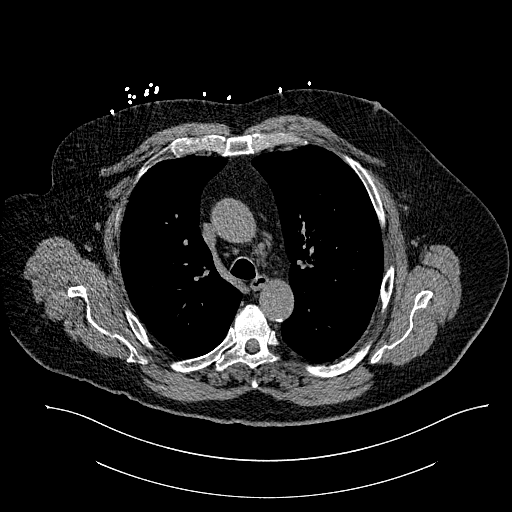
[im 120/175  lung]
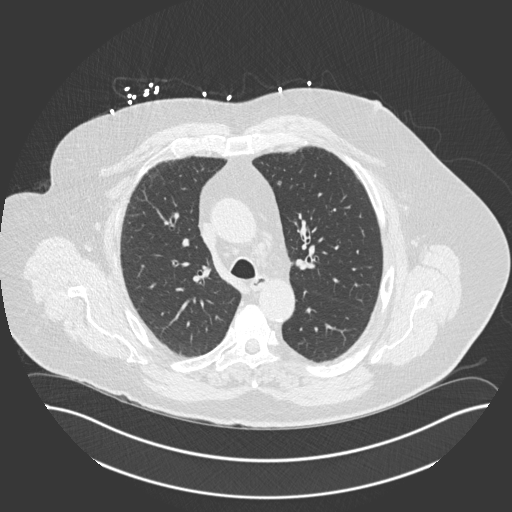
[im 138/175  lung]
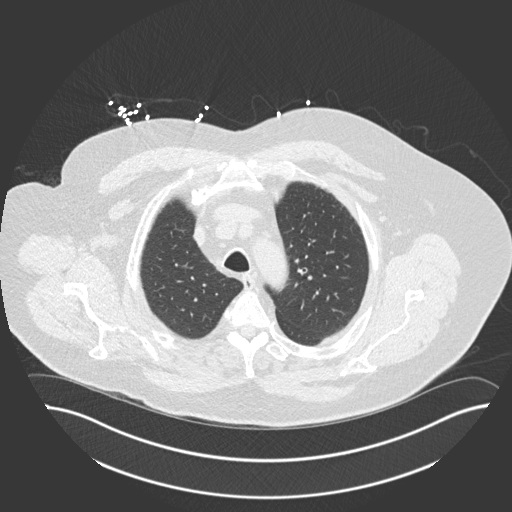
[im 147/175  lung]
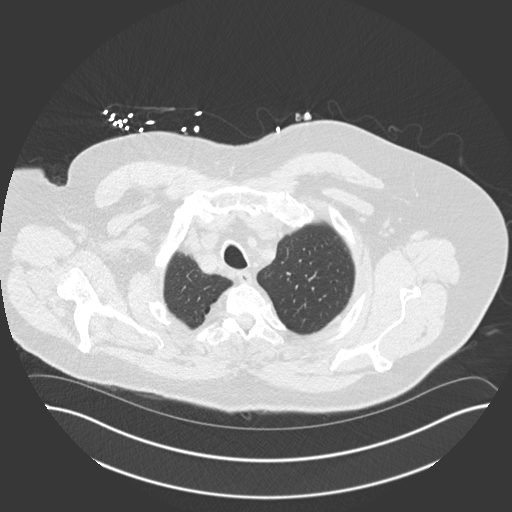
[im 165/175  lung]
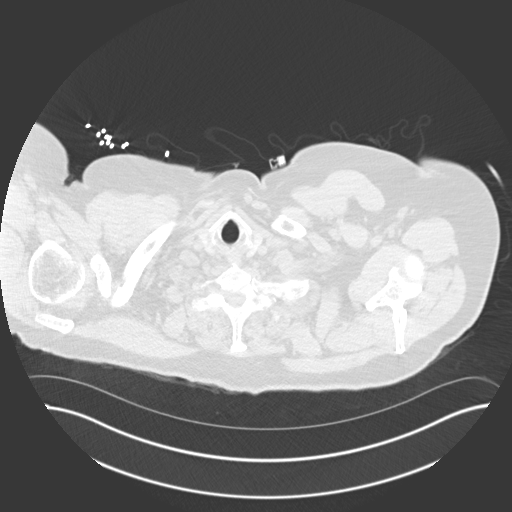

[Series 7: coronal · coronal · 0.68mm/px · 3 of 116 slices shown]
[im 24/116  lung]
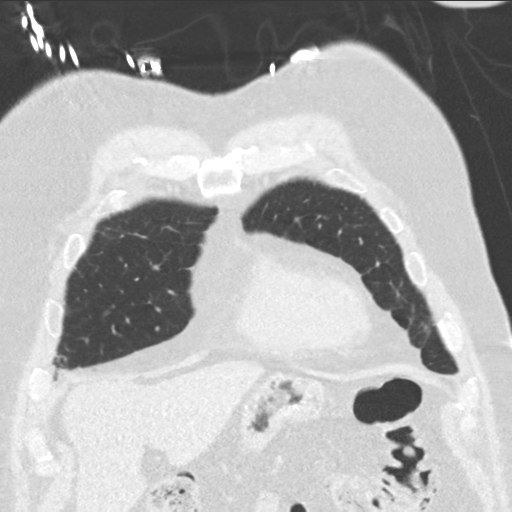
[im 47/116  lung]
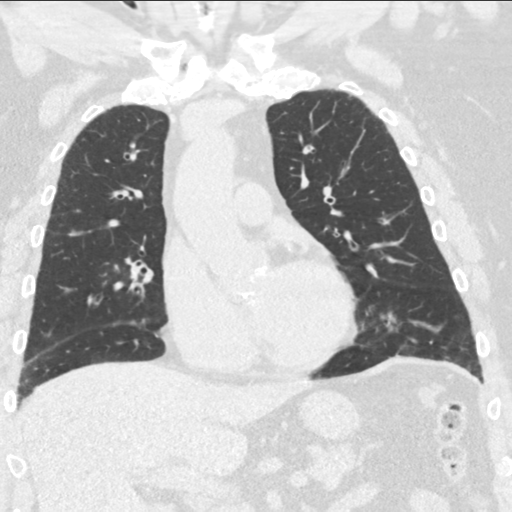
[im 70/116  lung]
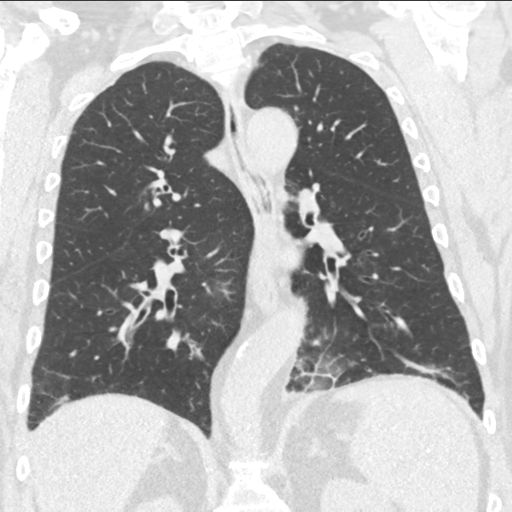

[15 of 36 positions shown; findings below may reference images not displayed]

FINDINGS: Cardiovascular: There are no significant vascular findings.
Scattered vascular calcifications are seen within the aorta.
Coronary artery calcifications are seen. The heart size is normal.
There is no pericardial thickening or effusion.

Mediastinum/Nodes: There are no enlarged mediastinal, hilar or
axillary lymph nodes. The thyroid gland, trachea and esophagus
demonstrate no significant findings.

Lungs/Pleura: Rounded patchy airspace opacity is seen within the
posterior left lung base with mild bronchial wall thickening. There
is small tree-in-bud opacities also noted within the right lung
base.

Upper abdomen: The visualized portion of the upper abdomen is
unremarkable.

Musculoskeletal/Chest wall: There is no chest wall mass or
suspicious osseous finding. No acute osseous abnormality
IMPRESSION: Patchy airspace opacity with tree-in-bud opacities at the posterior
left lung base which could be due to resolving infectious etiology
from December 2019 or inflammatory process.

## 2022-09-13 ENCOUNTER — Encounter: Payer: Self-pay | Admitting: Nurse Practitioner

## 2022-09-13 ENCOUNTER — Ambulatory Visit: Payer: Medicare HMO | Admitting: Nutrition

## 2022-09-13 ENCOUNTER — Inpatient Hospital Stay: Payer: Medicare HMO | Admitting: Nurse Practitioner

## 2022-09-13 VITALS — BP 107/60 | HR 77 | Temp 98.2°F | Resp 18 | Ht 72.0 in | Wt 255.2 lb

## 2022-09-13 DIAGNOSIS — C168 Malignant neoplasm of overlapping sites of stomach: Secondary | ICD-10-CM | POA: Diagnosis not present

## 2022-09-13 DIAGNOSIS — Z5111 Encounter for antineoplastic chemotherapy: Secondary | ICD-10-CM | POA: Diagnosis not present

## 2022-09-13 NOTE — Progress Notes (Signed)
[facility] OFFICE PROGRESS NOTE   Diagnosis: Gastric cancer  INTERVAL HISTORY:   Todd Harrison returns as scheduled.  He completed cycle 9 FOLFOX 09/06/2022.  He denies nausea/vomiting.  No mouth sores.  No diarrhea.  Cold sensitivity lasted about 6 days.  No numbness or tingling in the absence of cold exposure.  Notes an alteration in taste.  Objective:  Vital signs in last 24 hours:  Blood pressure 107/60, pulse 77, temperature 98.2 F (36.8 C), temperature source Oral, resp. rate 18, height 6' (1.829 m), weight 255 lb 3.2 oz (115.8 kg), SpO2 98 %.    HEENT: No thrush or ulcers. Resp: Lungs clear bilaterally. Cardio: Regular rate and rhythm. GI: Abdomen soft and nontender. Vascular: No leg edema. Neuro: Vibratory sense intact over the fingertips per tuning fork exam. Skin: Palms without erythema. Port-A-Cath without erythema.   Lab Results:  Lab Results  Component Value Date   WBC 6.2 09/06/2022   HGB 10.8 (L) 09/06/2022   HCT 33.2 (L) 09/06/2022   MCV 93.0 09/06/2022   PLT 142 (L) 09/06/2022   NEUTROABS 3.6 09/06/2022    Imaging:  CT CHEST ABDOMEN PELVIS W CONTRAST  Result Date: 09/12/2022 CLINICAL DATA:  Gastric cancer.  Restaging.  * Tracking Code: BO * EXAM: CT CHEST, ABDOMEN, AND PELVIS WITH CONTRAST TECHNIQUE: Multidetector CT imaging of the chest, abdomen and pelvis was performed following the standard protocol during bolus administration of intravenous contrast. RADIATION DOSE REDUCTION: This exam was performed according to the departmental dose-optimization program which includes automated exposure control, adjustment of the mA and/or kV according to patient size and/or use of iterative reconstruction technique. CONTRAST:  OMNIPAQUE IOHEXOL 300 MG/ML  SOLN COMPARISON:  Chest CTA 07/24/2022. Abdomen and pelvis CT 07/08/2022. FINDINGS: CT CHEST FINDINGS Cardiovascular: The heart size is normal. No substantial pericardial effusion. Coronary artery  calcification is evident. Mild atherosclerotic calcification is noted in the wall of the thoracic aorta. Right Port-A-Cath tip is positioned in the distal SVC. Mediastinum/Nodes: No mediastinal lymphadenopathy. There is no hilar lymphadenopathy. The esophagus has normal imaging features. There is no axillary lymphadenopathy. Lungs/Pleura: The peripheral subpleural right middle lobe patchy airspace disease seen previously and likely related to infarct has decreased in nearly resolved in the interval. Subpleural reticulation in the lung bases, right greater than left, is mildly more prominent today. There is no new suspicious pulmonary nodule or mass. Tiny right pleural effusion is similar to prior. Musculoskeletal: No worrisome lytic or sclerotic osseous abnormality. CT ABDOMEN PELVIS FINDINGS Hepatobiliary: 13 mm ill-defined hypodensity in the dome of the liver is stable (48/3). The subtle segment IV enhancing lesion seen on the previous study is not discernible today. 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 Tiny hypodensity posterior segment II is unchanged. No new suspicious abnormality in the liver parenchyma. Tiny calcified gallstones not excluded in the neck of the gallbladder. No intrahepatic or extrahepatic biliary dilation. Pancreas: No focal mass lesion. No dilatation of the main duct. No intraparenchymal cyst. No peripancreatic edema. Spleen: No splenomegaly. No focal mass lesion. Adrenals/Urinary Tract: No adrenal nodule or mass. Kidneys unremarkable. No evidence for hydroureter. The urinary bladder appears normal for the degree of distention. Stomach/Bowel: Focal soft tissue thickening seen in the mid stomach previously is similar today (image 61/3). Distortion of the mid stomach is also similar to prior although antral region is less distended today. Duodenum is normally positioned as is  the ligament of Treitz.  No small bowel wall thickening. No small bowel dilatation. The terminal ileum is normal. The appendix is not well visualized, but there is no edema or inflammation in the region of the cecum. The wall thickening described previously on the distal aspect of the transverse colon is similar. No evidence for colon obstruction proximal to this level and findings likely reflect involvement by disease in the gastrocolic ligament. Diverticular changes are noted in the left colon without evidence of diverticulitis. Vascular/Lymphatic: There is mild atherosclerotic calcification of the abdominal aorta without aneurysm. There is no gastrohepatic or hepatoduodenal ligament lymphadenopathy. No retroperitoneal or mesenteric lymphadenopathy. No pelvic sidewall lymphadenopathy. Reproductive: The prostate gland and seminal vesicles are unremarkable. Other: Loculated fluid seen anterior abdomen previously has decreased in the interval. There is persistent stranding and heterogeneous nodular soft tissue attenuation in the gastrocolic ligament and mesentery. The index nodule measured previously between the sigmoid colon in the bladder at 2.8 cm is 2.8 cm again today on image 104/3. Peritoneal thickening/nodularity along the posterior pelvis (102/3) is similar to prior. There is some trace free fluid in the pelvis, decreased in the interval. Musculoskeletal: Subcutaneous gas in the low anterior abdominal wall is compatible with sites of injection. No worrisome lytic or sclerotic osseous abnormality. IMPRESSION: 1. Generally stable exam. No new or progressive interval findings. 2. Focal soft tissue thickening in the mid stomach is similar to prior. Distortion of the mid stomach is also similar to prior although antral region is less distended today. 3. Similar appearance of stranding and heterogeneous nodular soft tissue attenuation in the gastrocolic ligament, omentum and mesentery. Findings remain compatible with metastatic disease. 4.  Similar appearance of peritoneal thickening/nodularity along the posterior pelvis. 5. The subtle segment IV enhancing lesion described as new on the previous study is not discernible today. 6. Interval decrease in anterior abdominal and pelvic fluid. 7. Stable tiny right pleural effusion. 8.  Aortic Atherosclerosis (ICD10-I70.0). Electronically Signed   By: Kennith Center M.D.   On: 09/12/2022 16:37    Medications: I have reviewed the patient's current medications.  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  Internal hemorrhoids.  No specimens collected. Upper endoscopy 04/25/2022-large infiltrative mass with oozing bleeding and stigmata of recent bleeding in the gastric fundus, on the anterior wall of the gastric body and on the greater curvature of the gastric body; deformity in the gastric antrum.  Extrinsic deformity in the entire duodenum.  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 Her2 by IHC negative, 1+; MMR IHC normal; PD-L1 CPS 1%, positive.  Cycle 1 FOLFOX 05/04/2022 Cycle 2 FOLFOX 05/17/2022 Cycle 3 FOLFOX 05/31/2022-oxaliplatin dose reduced secondary to prolonged cold sensitivity Cycle 4 FOLFOX 06/14/2022 Cycle 5 FOLFOX 06/28/2022 CT abdomen/pelvis 07/08/2022-asymmetric wall thickening at the lesser curvature of the stomach, decreased ascites, increased diffuse omental and peritoneal nodularity Cycle 6 FOLFOX 07/12/2022 Cycle 7 FOLFOX 08/09/2022 Cycle 8 FOLFOX 08/23/2022 Cycle 9 FOLFOX  09/06/2022 CTs 09/11/2022-stable, no new or progressive interval findings Truncal rash-status post recent punch biopsy with pathology pending; biopsy reported negative per family 04/26/2022 Lichen planus of the feet Hypothyroidism Atrial fibrillation Admission 07/24/2022 with acute bilateral pulmonary embolism/right heart strain  Heparin anticoagulation 07/24/2022 Doppler 07/24/2022-acute DVT of the right femoral, popliteal, posterior tibial, and peroneal veins, acute left posterior tibial DVT 7.  Anemia secondary to chemotherapy, phlebotomy, and hemoptysis-improved  Disposition: Todd Harrison appears stable.  He has completed 9 cycles of FOLFOX.  Recent restaging CTs show stable disease.  Report/images reviewed with Todd Harrison and his family.  Plan to continue FOLFOX for 1 more cycle, then adjust to maintenance therapy with discontinuation of oxaliplatin, continuation of 5-FU.  We had preliminary discussion regarding maintenance Xeloda.  He will return for follow-up/FOLFOX in 1 week.  He is meeting with the [facility] dietitian today regarding the alteration in taste.  Patient seen with Dr. Truett Perna.    Lonna Cobb ANP/GNP-BC   09/13/2022  3:22 PM  This was a shared visit with Lonna Cobb.  Todd Harrison appears stable.  He has completed cycle 2 FOLFOX.  He has minimal neuropathy symptoms.  We reviewed the restaging CT findings and images with him.  There is no evidence of disease progression.  He will complete 1 more cycle of FOLFOX.  He will return in a few weeks to discuss maintenance therapy.  I was present for greater than 50% of today's visit.  I performed medical decision making.  Mancel Bale, MD

## 2022-09-13 NOTE — Progress Notes (Signed)
Brief follow up completed with patient regarding taste alterations. His weight is stable currently however, he reports most meat tastes bad. If he uses [place], he can eat it but generally he has an aversion. The intensity of the taste alterations is variable. He has not tried the baking soda and salt water gargle we discussed in the past. He is agreeable to trying it now. Also recommended eating dill pickle with meals to enhance taste. Provided additional nutrition fact sheet on taste alterations. Will continue to follow.

## 2022-09-14 ENCOUNTER — Other Ambulatory Visit: Payer: Self-pay | Admitting: Oncology

## 2022-09-14 DIAGNOSIS — C168 Malignant neoplasm of overlapping sites of stomach: Secondary | ICD-10-CM

## 2022-09-20 ENCOUNTER — Inpatient Hospital Stay: Payer: Medicare HMO

## 2022-09-20 ENCOUNTER — Inpatient Hospital Stay: Payer: Medicare HMO | Admitting: Nurse Practitioner

## 2022-09-20 ENCOUNTER — Encounter: Payer: Self-pay | Admitting: Nurse Practitioner

## 2022-09-20 VITALS — BP 106/65 | HR 76 | Temp 98.2°F | Resp 18 | Ht 72.0 in | Wt 259.2 lb

## 2022-09-20 VITALS — BP 115/64

## 2022-09-20 DIAGNOSIS — Z5111 Encounter for antineoplastic chemotherapy: Secondary | ICD-10-CM | POA: Diagnosis not present

## 2022-09-20 DIAGNOSIS — C168 Malignant neoplasm of overlapping sites of stomach: Secondary | ICD-10-CM | POA: Diagnosis not present

## 2022-09-20 LAB — CMP (CANCER CENTER ONLY)
ALT: 10 U/L (ref 0–44)
AST: 14 U/L — ABNORMAL LOW (ref 15–41)
Albumin: 3.7 g/dL (ref 3.5–5.0)
Alkaline Phosphatase: 44 U/L (ref 38–126)
Anion gap: 7 (ref 5–15)
BUN: 13 mg/dL (ref 8–23)
CO2: 25 mmol/L (ref 22–32)
Calcium: 8.8 mg/dL — ABNORMAL LOW (ref 8.9–10.3)
Chloride: 105 mmol/L (ref 98–111)
Creatinine: 0.92 mg/dL (ref 0.61–1.24)
GFR, Estimated: >60 mL/min (ref >60)
Glucose, Bld: 100 mg/dL — ABNORMAL HIGH (ref 70–99)
Potassium: 3.7 mmol/L (ref 3.5–5.1)
Sodium: 137 mmol/L (ref 135–145)
Total Bilirubin: 0.5 mg/dL (ref 0.3–1.2)
Total Protein: 6.1 g/dL — ABNORMAL LOW (ref 6.5–8.1)

## 2022-09-20 LAB — CBC WITH DIFFERENTIAL (CANCER CENTER ONLY)
Abs Immature Granulocytes: 0.02 10*3/uL (ref 0.00–0.07)
Basophils Absolute: 0.1 10*3/uL (ref 0.0–0.1)
Basophils Relative: 1 %
Eosinophils Absolute: 0.1 10*3/uL (ref 0.0–0.5)
Eosinophils Relative: 2 %
HCT: 33.4 % — ABNORMAL LOW (ref 39.0–52.0)
Hemoglobin: 10.9 g/dL — ABNORMAL LOW (ref 13.0–17.0)
Immature Granulocytes: 0 %
Lymphocytes Relative: 24 %
Lymphs Abs: 1.8 10*3/uL (ref 0.7–4.0)
MCH: 30.5 pg (ref 26.0–34.0)
MCHC: 32.6 g/dL (ref 30.0–36.0)
MCV: 93.6 fL (ref 80.0–100.0)
Monocytes Absolute: 0.8 10*3/uL (ref 0.1–1.0)
Monocytes Relative: 11 %
Neutro Abs: 4.7 10*3/uL (ref 1.7–7.7)
Neutrophils Relative %: 62 %
Platelet Count: 132 10*3/uL — ABNORMAL LOW (ref 150–400)
RBC: 3.57 MIL/uL — ABNORMAL LOW (ref 4.22–5.81)
RDW: 17.5 % — ABNORMAL HIGH (ref 11.5–15.5)
WBC Count: 7.5 10*3/uL (ref 4.0–10.5)
nRBC: 0 % (ref 0.0–0.2)

## 2022-09-20 MED ORDER — ONDANSETRON HCL 8 MG PO TABS
8.0000 mg | ORAL_TABLET | Freq: Three times a day (TID) | ORAL | 1 refills | Status: DC | PRN
Start: 2022-09-20 — End: 2023-08-18

## 2022-09-20 MED ORDER — PALONOSETRON HCL INJECTION 0.25 MG/5ML
0.2500 mg | Freq: Once | INTRAVENOUS | Status: AC
  Administered 2022-09-20: 0.25 mg via INTRAVENOUS
  Filled 2022-09-20: qty 5

## 2022-09-20 MED ORDER — SODIUM CHLORIDE 0.9 % IV SOLN
2000.0000 mg/m2 | INTRAVENOUS | Status: DC
  Administered 2022-09-20: 5000 mg via INTRAVENOUS
  Filled 2022-09-20: qty 100

## 2022-09-20 MED ORDER — OXALIPLATIN CHEMO INJECTION 100 MG/20ML
65.0000 mg/m2 | Freq: Once | INTRAVENOUS | Status: AC
  Administered 2022-09-20: 150 mg via INTRAVENOUS
  Filled 2022-09-20: qty 20

## 2022-09-20 MED ORDER — LEUCOVORIN CALCIUM INJECTION 350 MG
400.0000 mg/m2 | Freq: Once | INTRAVENOUS | Status: AC
  Administered 2022-09-20: 956 mg via INTRAVENOUS
  Filled 2022-09-20: qty 47.8

## 2022-09-20 MED ORDER — SODIUM CHLORIDE 0.9 % IV SOLN
10.0000 mg | Freq: Once | INTRAVENOUS | Status: AC
  Administered 2022-09-20: 10 mg via INTRAVENOUS
  Filled 2022-09-20: qty 10

## 2022-09-20 MED ORDER — DEXTROSE 5 % IV SOLN: Freq: Once | INTRAVENOUS | Status: AC

## 2022-09-20 MED ORDER — FLUOROURACIL CHEMO INJECTION 2.5 GM/50ML
400.0000 mg/m2 | Freq: Once | INTRAVENOUS | Status: AC
  Administered 2022-09-20: 1000 mg via INTRAVENOUS
  Filled 2022-09-20: qty 20

## 2022-09-20 NOTE — Progress Notes (Signed)
[facility] OFFICE PROGRESS NOTE   Diagnosis: Gastric cancer gastric cancer  INTERVAL HISTORY:   Todd Harrison returns as scheduled.  He completed cycle 9 for Fox 09/06/2022.  He is seen today prior to proceeding with cycle 10.  He had some episodes of mild nausea earlier this week.  1 loose stool.  He has mild intermittent cold sensitivity.  No numbness or tingling in the absence of cold exposure.  He thinks he may have been dehydrated recently, felt lightheaded.  He has increased fluid intake.  Objective:  Vital signs in last 24 hours:  Blood pressure 106/65, pulse 76, temperature 98.2 F (36.8 C), temperature source Oral, resp. rate 18, height 6' (1.829 m), weight 259 lb 3.2 oz (117.6 kg), SpO2 98 %.    HEENT: No thrush or ulcers. Resp: Lungs clear bilaterally. Cardio: Regular rate and rhythm. GI: Abdomen soft and nontender.  No hepatomegaly. Vascular: No leg edema. Skin: Palms without erythema. Port-A-Cath without erythema.  Lab Results:  Lab Results  Component Value Date   WBC 7.5 09/20/2022   HGB 10.9 (L) 09/20/2022   HCT 33.4 (L) 09/20/2022   MCV 93.6 09/20/2022   PLT 132 (L) 09/20/2022   NEUTROABS 4.7 09/20/2022    Imaging:  No results found.  Medications: I have reviewed the patient's current medications.  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  Internal hemorrhoids.  No specimens collected. Upper endoscopy 04/25/2022-large infiltrative mass with oozing bleeding and stigmata of recent  bleeding in the gastric fundus, on the anterior wall of the gastric body and on the greater curvature of the gastric body; deformity in the gastric antrum.  Extrinsic deformity in the entire duodenum.  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 Her2 by IHC negative, 1+; MMR IHC normal; PD-L1 CPS 1%, positive.  Cycle 1 FOLFOX 05/04/2022 Cycle 2 FOLFOX 05/17/2022 Cycle 3 FOLFOX 05/31/2022-oxaliplatin dose reduced secondary to prolonged cold sensitivity Cycle 4 FOLFOX 06/14/2022 Cycle 5 FOLFOX 06/28/2022 CT abdomen/pelvis 07/08/2022-asymmetric wall thickening at the lesser curvature of the stomach, decreased ascites, increased diffuse omental and peritoneal nodularity Cycle 6 FOLFOX 07/12/2022 Cycle 7 FOLFOX 08/09/2022 Cycle 8 FOLFOX 08/23/2022 Cycle 9 FOLFOX 09/06/2022 CTs 09/11/2022-stable, no new or progressive interval findings Cycle 10 FOLFOX 09/20/2022 Truncal rash-status post recent punch biopsy with pathology pending; biopsy reported negative per family 04/26/2022 Lichen planus of the feet Hypothyroidism Atrial fibrillation Admission 07/24/2022 with acute bilateral pulmonary embolism/right heart strain Heparin anticoagulation 07/24/2022 Doppler 07/24/2022-acute DVT of the right femoral, popliteal, posterior tibial, and peroneal veins, acute left posterior tibial DVT 7.  Anemia secondary to chemotherapy, phlebotomy, and hemoptysis-improved  Disposition: Todd Harrison appears stable.  He has completed 9 cycles of FOLFOX.  Recent restaging CTs showed stable disease.  Plan to proceed with cycle 10 FOLFOX today as scheduled.  Treatment will be adjusted to maintenance therapy after this cycle.  CBC and chemistry panel reviewed.  Labs adequate to proceed as above.  He will return for lab and follow-up in 2 weeks.  He prefers to try capecitabine versus 5-FU via pump for the maintenance therapy.    Lonna Cobb ANP/GNP-BC   09/20/2022  11:52 AM

## 2022-09-20 NOTE — Patient Instructions (Signed)
[facility]   The chemotherapy medication bag should finish at 46 hours, 96 hours, or 7 days. For example, if your pump is scheduled for 46 hours and it was put on at 4:00 p.m., it should finish at 2:00 p.m. the day it is scheduled to come off regardless of your appointment time.     Estimated time to finish at 2:00 Friday, Sep 22, 2022.   If the display on your pump reads "Low Volume" and it is beeping, take the batteries out of the pump and come to the cancer center for it to be taken off.   If the pump alarms go off prior to the pump reading "Low Volume" then call [phone] and someone can assist you.  If the plunger comes out and the chemotherapy medication is leaking out, please use your home chemo spill kit to clean up the spill. Do NOT use paper towels or other household products.  If you have problems or questions regarding your pump, please call either [phone] (24 hours a day) or the cancer center Monday-Friday 8:00 a.m.- 4:30 p.m. at the clinic number and we will assist you. If you are unable to get assistance, then go to the nearest Emergency Department and ask the staff to contact the IV team for assistance.  Discharge Instructions: Thank you for choosing [facility] to provide your oncology and hematology care.   If you have a lab appointment with the Cancer Center, please go directly to the Cancer Center and check in at the registration area.   Wear comfortable clothing and clothing appropriate for easy access to any Portacath or PICC line.   We strive to give you quality time with your provider. You may need to reschedule your appointment if you arrive late (15 or more minutes).  Arriving late affects you and other patients whose appointments are after yours.  Also, if you miss three or more appointments without notifying the office, you may be dismissed from the clinic at the provider's discretion.      For  prescription refill requests, have your pharmacy contact our office and allow 72 hours for refills to be completed.    Today you received the following chemotherapy and/or immunotherapy agents Oxaliplatin, Leucovorin, Fluorouracil.      To help prevent nausea and vomiting after your treatment, we encourage you to take your nausea medication as directed.  BELOW ARE SYMPTOMS THAT SHOULD BE REPORTED IMMEDIATELY: *FEVER GREATER THAN 100.4 F (38 C) OR HIGHER *CHILLS OR SWEATING *NAUSEA AND VOMITING THAT IS NOT CONTROLLED WITH YOUR NAUSEA MEDICATION *UNUSUAL SHORTNESS OF BREATH *UNUSUAL BRUISING OR BLEEDING *URINARY PROBLEMS (pain or burning when urinating, or frequent urination) *BOWEL PROBLEMS (unusual diarrhea, constipation, pain near the anus) TENDERNESS IN MOUTH AND THROAT WITH OR WITHOUT PRESENCE OF ULCERS (sore throat, sores in mouth, or a toothache) UNUSUAL RASH, SWELLING OR PAIN  UNUSUAL VAGINAL DISCHARGE OR ITCHING   Items with * indicate a potential emergency and should be followed up as soon as possible or go to the Emergency Department if any problems should occur.  Please show the CHEMOTHERAPY ALERT CARD or IMMUNOTHERAPY ALERT CARD at check-in to the Emergency Department and triage nurse.  Should you have questions after your visit or need to cancel or reschedule your appointment, please contact [facility]  Dept: [phone]  and follow the prompts.  Office hours are 8:00 a.m. to 4:30 p.m. Monday - Friday. Please note  that voicemails left after 4:00 p.m. may not be returned until the following business day.  We are closed weekends and major holidays. You have access to a nurse at all times for urgent questions. Please call the main number to the clinic Dept: [phone] and follow the prompts.   For any non-urgent questions, you may also contact your provider using MyChart. We now offer e-Visits for anyone 18 and older to request care online  for non-urgent symptoms. For details visit [email or web address].   Also download the MyChart app! Go to the app store, search "MyChart", open the app, select [facility], and log in with your MyChart username and password.  Oxaliplatin Injection What is this medication? OXALIPLATIN (ox AL i PLA tin) treats colorectal cancer. It works by slowing down the growth of cancer cells. This medicine may be used for other purposes; ask your health care provider or pharmacist if you have questions. COMMON BRAND NAME(S): Eloxatin What should I tell my care team before I take this medication? They need to know if you have any of these conditions: Heart disease History of irregular heartbeat or rhythm Liver disease Low blood cell levels (white cells, red cells, and platelets) Lung or breathing disease, such as asthma Take medications that treat or prevent blood clots Tingling of the fingers, toes, or other nerve disorder An unusual or allergic reaction to oxaliplatin, other medications, foods, dyes, or preservatives If you or your partner are pregnant or trying to get pregnant Breast-feeding How should I use this medication? This medication is injected into a vein. It is given by your care team in a hospital or clinic setting. Talk to your care team about the use of this medication in children. Special care may be needed. Overdosage: If you think you have taken too much of this medicine contact a poison control center or emergency room at once. NOTE: This medicine is only for you. Do not share this medicine with others. What if I miss a dose? Keep appointments for follow-up doses. It is important not to miss a dose. Call your care team if you are unable to keep an appointment. What may interact with this medication? Do not take this medication with any of the following: Cisapride Dronedarone Pimozide Thioridazine This medication may also interact with the following: Aspirin and aspirin-like  medications Certain medications that treat or prevent blood clots, such as warfarin, apixaban, dabigatran, and rivaroxaban Cisplatin Cyclosporine Diuretics Medications for infection, such as acyclovir, adefovir, amphotericin B, bacitracin, cidofovir, foscarnet, ganciclovir, gentamicin, pentamidine, vancomycin NSAIDs, medications for pain and inflammation, such as ibuprofen or naproxen Other medications that cause heart rhythm changes Pamidronate Zoledronic acid This list may not describe all possible interactions. Give your health care provider a list of all the medicines, herbs, non-prescription drugs, or dietary supplements you use. Also tell them if you smoke, drink alcohol, or use illegal drugs. Some items may interact with your medicine. What should I watch for while using this medication? Your condition will be monitored carefully while you are receiving this medication. You may need blood work while taking this medication. This medication may make you feel generally unwell. This is not uncommon as chemotherapy can affect healthy cells as well as cancer cells. Report any side effects. Continue your course of treatment even though you feel ill unless your care team tells you to stop. This medication may increase your risk of getting an infection. Call your care team for advice if you get a fever, chills, sore throat, or   other symptoms of a cold or flu. Do not treat yourself. Try to avoid being around people who are sick. Avoid taking medications that contain aspirin, acetaminophen, ibuprofen, naproxen, or ketoprofen unless instructed by your care team. These medications may hide a fever. Be careful brushing or flossing your teeth or using a toothpick because you may get an infection or bleed more easily. If you have any dental work done, tell your dentist you are receiving this medication. This medication can make you more sensitive to cold. Do not drink cold drinks or use ice. Cover exposed  skin before coming in contact with cold temperatures or cold objects. When out in cold weather wear warm clothing and cover your mouth and nose to warm the air that goes into your lungs. Tell your care team if you get sensitive to the cold. Talk to your care team if you or your partner are pregnant or think either of you might be pregnant. This medication can cause serious birth defects if taken during pregnancy and for 9 months after the last dose. A negative pregnancy test is required before starting this medication. A reliable form of contraception is recommended while taking this medication and for 9 months after the last dose. Talk to your care team about effective forms of contraception. Do not father a child while taking this medication and for 6 months after the last dose. Use a condom while having sex during this time period. Do not breastfeed while taking this medication and for 3 months after the last dose. This medication may cause infertility. Talk to your care team if you are concerned about your fertility. What side effects may I notice from receiving this medication? Side effects that you should report to your care team as soon as possible: Allergic reactions--skin rash, itching, hives, swelling of the face, lips, tongue, or throat Bleeding--bloody or black, tar-like stools, vomiting blood or brown material that looks like coffee grounds, red or dark brown urine, small red or purple spots on skin, unusual bruising or bleeding Dry cough, shortness of breath or trouble breathing Heart rhythm changes--fast or irregular heartbeat, dizziness, feeling faint or lightheaded, chest pain, trouble breathing Infection--fever, chills, cough, sore throat, wounds that don't heal, pain or trouble when passing urine, general feeling of discomfort or being unwell Liver injury--right upper belly pain, loss of appetite, nausea, light-colored stool, dark yellow or brown urine, yellowing skin or eyes, unusual  weakness or fatigue Low red blood cell level--unusual weakness or fatigue, dizziness, headache, trouble breathing Muscle injury--unusual weakness or fatigue, muscle pain, dark yellow or brown urine, decrease in amount of urine Pain, tingling, or numbness in the hands or feet Sudden and severe headache, confusion, change in vision, seizures, which may be signs of posterior reversible encephalopathy syndrome (PRES) Unusual bruising or bleeding Side effects that usually do not require medical attention (report to your care team if they continue or are bothersome): Diarrhea Nausea Pain, redness, or swelling with sores inside the mouth or throat Unusual weakness or fatigue Vomiting This list may not describe all possible side effects. Call your doctor for medical advice about side effects. You may report side effects to FDA at 1-800-FDA-1088. Where should I keep my medication? This medication is given in a hospital or clinic. It will not be stored at home. NOTE: This sheet is a summary. It may not cover all possible information. If you have questions about this medicine, talk to your doctor, pharmacist, or health care provider.  2023 Elsevier/Gold Standard (  2007-06-15 00:00:00) Leucovorin Injection What is this medication? LEUCOVORIN (loo koe VOR in) prevents side effects from certain medications, such as methotrexate. It works by increasing folate levels. This helps protect healthy cells in your body. It may also be used to treat anemia caused by low levels of folate. It can also be used with fluorouracil, a type of chemotherapy, to treat colorectal cancer. It works by increasing the effects of fluorouracil in the body. This medicine may be used for other purposes; ask your health care provider or pharmacist if you have questions. What should I tell my care team before I take this medication? They need to know if you have any of these conditions: Anemia from low levels of vitamin B12 in the  blood An unusual or allergic reaction to leucovorin, folic acid, other medications, foods, dyes, or preservatives Pregnant or trying to get pregnant Breastfeeding How should I use this medication? This medication is injected into a vein or a muscle. It is given by your care team in a hospital or clinic setting. Talk to your care team about the use of this medication in children. Special care may be needed. Overdosage: If you think you have taken too much of this medicine contact a poison control center or emergency room at once. NOTE: This medicine is only for you. Do not share this medicine with others. What if I miss a dose? Keep appointments for follow-up doses. It is important not to miss your dose. Call your care team if you are unable to keep an appointment. What may interact with this medication? Capecitabine Fluorouracil Phenobarbital Phenytoin Primidone Trimethoprim;sulfamethoxazole This list may not describe all possible interactions. Give your health care provider a list of all the medicines, herbs, non-prescription drugs, or dietary supplements you use. Also tell them if you smoke, drink alcohol, or use illegal drugs. Some items may interact with your medicine. What should I watch for while using this medication? Your condition will be monitored carefully while you are receiving this medication. This medication may increase the side effects of 5-fluorouracil. Tell your care team if you have diarrhea or mouth sores that do not get better or that get worse. What side effects may I notice from receiving this medication? Side effects that you should report to your care team as soon as possible: Allergic reactions--skin rash, itching, hives, swelling of the face, lips, tongue, or throat This list may not describe all possible side effects. Call your doctor for medical advice about side effects. You may report side effects to FDA at 1-800-FDA-1088. Where should I keep my  medication? This medication is given in a hospital or clinic. It will not be stored at home. NOTE: This sheet is a summary. It may not cover all possible information. If you have questions about this medicine, talk to your doctor, pharmacist, or health care provider.  2023 Elsevier/Gold Standard (2021-09-02 00:00:00) Fluorouracil Injection What is this medication? FLUOROURACIL (flure oh YOOR a sil) treats some types of cancer. It works by slowing down the growth of cancer cells. This medicine may be used for other purposes; ask your health care provider or pharmacist if you have questions. COMMON BRAND NAME(S): Adrucil What should I tell my care team before I take this medication? They need to know if you have any of these conditions: Blood disorders Dihydropyrimidine dehydrogenase (DPD) deficiency Infection, such as chickenpox, cold sores, herpes Kidney disease Liver disease Poor nutrition Recent or ongoing radiation therapy An unusual or allergic reaction to fluorouracil, other  medications, foods, dyes, or preservatives If you or your partner are pregnant or trying to get pregnant Breast-feeding How should I use this medication? This medication is injected into a vein. It is administered by your care team in a hospital or clinic setting. Talk to your care team about the use of this medication in children. Special care may be needed. Overdosage: If you think you have taken too much of this medicine contact a poison control center or emergency room at once. NOTE: This medicine is only for you. Do not share this medicine with others. What if I miss a dose? Keep appointments for follow-up doses. It is important not to miss your dose. Call your care team if you are unable to keep an appointment. What may interact with this medication? Do not take this medication with any of the following: Live virus vaccines This medication may also interact with the following: Medications that treat or  prevent blood clots, such as warfarin, enoxaparin, dalteparin This list may not describe all possible interactions. Give your health care provider a list of all the medicines, herbs, non-prescription drugs, or dietary supplements you use. Also tell them if you smoke, drink alcohol, or use illegal drugs. Some items may interact with your medicine. What should I watch for while using this medication? Your condition will be monitored carefully while you are receiving this medication. This medication may make you feel generally unwell. This is not uncommon as chemotherapy can affect healthy cells as well as cancer cells. Report any side effects. Continue your course of treatment even though you feel ill unless your care team tells you to stop. In some cases, you may be given additional medications to help with side effects. Follow all directions for their use. This medication may increase your risk of getting an infection. Call your care team for advice if you get a fever, chills, sore throat, or other symptoms of a cold or flu. Do not treat yourself. Try to avoid being around people who are sick. This medication may increase your risk to bruise or bleed. Call your care team if you notice any unusual bleeding. Be careful brushing or flossing your teeth or using a toothpick because you may get an infection or bleed more easily. If you have any dental work done, tell your dentist you are receiving this medication. Avoid taking medications that contain aspirin, acetaminophen, ibuprofen, naproxen, or ketoprofen unless instructed by your care team. These medications may hide a fever. Do not treat diarrhea with over the counter products. Contact your care team if you have diarrhea that lasts more than 2 days or if it is severe and watery. This medication can make you more sensitive to the sun. Keep out of the sun. If you cannot avoid being in the sun, wear protective clothing and sunscreen. Do not use sun lamps,  tanning beds, or tanning booths. Talk to your care team if you or your partner wish to become pregnant or think you might be pregnant. This medication can cause serious birth defects if taken during pregnancy and for 3 months after the last dose. A reliable form of contraception is recommended while taking this medication and for 3 months after the last dose. Talk to your care team about effective forms of contraception. Do not father a child while taking this medication and for 3 months after the last dose. Use a condom while having sex during this time period. Do not breastfeed while taking this medication. This medication may cause  infertility. Talk to your care team if you are concerned about your fertility. What side effects may I notice from receiving this medication? Side effects that you should report to your care team as soon as possible: Allergic reactions--skin rash, itching, hives, swelling of the face, lips, tongue, or throat Heart attack--pain or tightness in the chest, shoulders, arms, or jaw, nausea, shortness of breath, cold or clammy skin, feeling faint or lightheaded Heart failure--shortness of breath, swelling of the ankles, feet, or hands, sudden weight gain, unusual weakness or fatigue Heart rhythm changes--fast or irregular heartbeat, dizziness, feeling faint or lightheaded, chest pain, trouble breathing High ammonia level--unusual weakness or fatigue, confusion, loss of appetite, nausea, vomiting, seizures Infection--fever, chills, cough, sore throat, wounds that don't heal, pain or trouble when passing urine, general feeling of discomfort or being unwell Low red blood cell level--unusual weakness or fatigue, dizziness, headache, trouble breathing Pain, tingling, or numbness in the hands or feet, muscle weakness, change in vision, confusion or trouble speaking, loss of balance or coordination, trouble walking, seizures Redness, swelling, and blistering of the skin over hands and  feet Severe or prolonged diarrhea Unusual bruising or bleeding Side effects that usually do not require medical attention (report to your care team if they continue or are bothersome): Dry skin Headache Increased tears Nausea Pain, redness, or swelling with sores inside the mouth or throat Sensitivity to light Vomiting This list may not describe all possible side effects. Call your doctor for medical advice about side effects. You may report side effects to FDA at 1-800-FDA-1088. Where should I keep my medication? This medication is given in a hospital or clinic. It will not be stored at home. NOTE: This sheet is a summary. It may not cover all possible information. If you have questions about this medicine, talk to your doctor, pharmacist, or health care provider.  2023 Elsevier/Gold Standard (2021-08-23 00:00:00)

## 2022-09-20 NOTE — Progress Notes (Signed)
Patient seen by Lisa Thomas NP today  Vitals are within treatment parameters.  Labs reviewed by Lisa Thomas NP and are within treatment parameters.  Per physician team, patient is ready for treatment and there are NO modifications to the treatment plan.     

## 2022-09-22 ENCOUNTER — Inpatient Hospital Stay: Payer: Medicare HMO

## 2022-09-22 VITALS — BP 99/69 | HR 70 | Temp 98.2°F | Resp 18

## 2022-09-22 DIAGNOSIS — C168 Malignant neoplasm of overlapping sites of stomach: Secondary | ICD-10-CM

## 2022-09-22 DIAGNOSIS — Z5111 Encounter for antineoplastic chemotherapy: Secondary | ICD-10-CM | POA: Diagnosis not present

## 2022-09-22 MED ORDER — HEPARIN SOD (PORK) LOCK FLUSH 100 UNIT/ML IV SOLN
500.0000 [IU] | Freq: Once | INTRAVENOUS | Status: AC | PRN
  Administered 2022-09-22: 500 [IU]

## 2022-09-22 MED ORDER — SODIUM CHLORIDE 0.9% FLUSH
10.0000 mL | INTRAVENOUS | Status: DC | PRN
  Administered 2022-09-22: 10 mL

## 2022-09-22 NOTE — Patient Instructions (Signed)

## 2022-10-04 ENCOUNTER — Inpatient Hospital Stay: Payer: Medicare HMO

## 2022-10-04 ENCOUNTER — Telehealth: Payer: Self-pay | Admitting: Pharmacist

## 2022-10-04 ENCOUNTER — Inpatient Hospital Stay: Payer: Medicare HMO | Admitting: Oncology

## 2022-10-04 ENCOUNTER — Other Ambulatory Visit: Payer: Self-pay

## 2022-10-04 ENCOUNTER — Other Ambulatory Visit: Payer: Self-pay | Admitting: *Deleted

## 2022-10-04 ENCOUNTER — Encounter: Payer: Self-pay | Admitting: Oncology

## 2022-10-04 ENCOUNTER — Other Ambulatory Visit (HOSPITAL_COMMUNITY): Payer: Self-pay

## 2022-10-04 ENCOUNTER — Telehealth: Payer: Self-pay

## 2022-10-04 DIAGNOSIS — Z5111 Encounter for antineoplastic chemotherapy: Secondary | ICD-10-CM | POA: Diagnosis not present

## 2022-10-04 DIAGNOSIS — C168 Malignant neoplasm of overlapping sites of stomach: Secondary | ICD-10-CM

## 2022-10-04 DIAGNOSIS — R5383 Other fatigue: Secondary | ICD-10-CM

## 2022-10-04 DIAGNOSIS — Z95828 Presence of other vascular implants and grafts: Secondary | ICD-10-CM

## 2022-10-04 LAB — CMP (CANCER CENTER ONLY)
ALT: 9 U/L (ref 0–44)
AST: 14 U/L — ABNORMAL LOW (ref 15–41)
Albumin: 3.6 g/dL (ref 3.5–5.0)
Alkaline Phosphatase: 43 U/L (ref 38–126)
Anion gap: 8 (ref 5–15)
BUN: 14 mg/dL (ref 8–23)
CO2: 24 mmol/L (ref 22–32)
Calcium: 8.9 mg/dL (ref 8.9–10.3)
Chloride: 104 mmol/L (ref 98–111)
Creatinine: 0.93 mg/dL (ref 0.61–1.24)
GFR, Estimated: >60 mL/min (ref >60)
Glucose, Bld: 102 mg/dL — ABNORMAL HIGH (ref 70–99)
Potassium: 3.7 mmol/L (ref 3.5–5.1)
Sodium: 136 mmol/L (ref 135–145)
Total Bilirubin: 0.6 mg/dL (ref 0.3–1.2)
Total Protein: 6.1 g/dL — ABNORMAL LOW (ref 6.5–8.1)

## 2022-10-04 LAB — CBC WITH DIFFERENTIAL (CANCER CENTER ONLY)
Abs Immature Granulocytes: 0.01 10*3/uL (ref 0.00–0.07)
Basophils Absolute: 0.1 10*3/uL (ref 0.0–0.1)
Basophils Relative: 1 %
Eosinophils Absolute: 0.1 10*3/uL (ref 0.0–0.5)
Eosinophils Relative: 2 %
HCT: 33.2 % — ABNORMAL LOW (ref 39.0–52.0)
Hemoglobin: 11 g/dL — ABNORMAL LOW (ref 13.0–17.0)
Immature Granulocytes: 0 %
Lymphocytes Relative: 23 %
Lymphs Abs: 1.4 10*3/uL (ref 0.7–4.0)
MCH: 30.9 pg (ref 26.0–34.0)
MCHC: 33.1 g/dL (ref 30.0–36.0)
MCV: 93.3 fL (ref 80.0–100.0)
Monocytes Absolute: 0.7 10*3/uL (ref 0.1–1.0)
Monocytes Relative: 11 %
Neutro Abs: 3.7 10*3/uL (ref 1.7–7.7)
Neutrophils Relative %: 63 %
Platelet Count: 124 10*3/uL — ABNORMAL LOW (ref 150–400)
RBC: 3.56 MIL/uL — ABNORMAL LOW (ref 4.22–5.81)
RDW: 17 % — ABNORMAL HIGH (ref 11.5–15.5)
WBC Count: 5.9 10*3/uL (ref 4.0–10.5)
nRBC: 0 % (ref 0.0–0.2)

## 2022-10-04 LAB — TSH: TSH: 4.076 u[IU]/mL (ref 0.350–4.500)

## 2022-10-04 MED ORDER — HEPARIN SOD (PORK) LOCK FLUSH 100 UNIT/ML IV SOLN
500.0000 [IU] | Freq: Once | INTRAVENOUS | Status: AC
  Administered 2022-10-04: 500 [IU] via INTRAVENOUS

## 2022-10-04 MED ORDER — CAPECITABINE 500 MG PO TABS
ORAL_TABLET | ORAL | 0 refills | Status: DC
  Filled 2022-10-04: qty 98, fill #0

## 2022-10-04 MED ORDER — SODIUM CHLORIDE 0.9% FLUSH
10.0000 mL | INTRAVENOUS | Status: DC | PRN
  Administered 2022-10-04: 10 mL via INTRAVENOUS

## 2022-10-04 NOTE — Patient Instructions (Signed)

## 2022-10-04 NOTE — Telephone Encounter (Signed)
Oral Oncology Patient Advocate Encounter  After completing a benefits investigation, prior authorization for Capecitabine is not required at this time through [facility] Part D (patient has advantage plan so Med B tied into plan).  Patient's copay is $574.99.    [facility] Price is $65.66   Ardeen Fillers, CPhT Oncology Pharmacy Patient Advocate  [facility]  [phone] (phone) [phone] (fax) 10/04/2022 11:32 AM

## 2022-10-04 NOTE — Progress Notes (Signed)
[facility] OFFICE PROGRESS NOTE   Diagnosis: Gastric cancer  INTERVAL HISTORY:   Mr. Picone complete another cycle of FOLFOX 09/20/2022.  He reports cold sensitivity following chemotherapy.  No neuropathy symptoms at present.  He continues Lovenox anticoagulation.  The rash over his trunk and feet has improved while on chemotherapy.  Objective:  Vital signs in last 24 hours:  Blood pressure 104/68, pulse 75, temperature 98.2 F (36.8 C), temperature source Oral, resp. rate 18, height 6' (1.829 m), weight 257 lb (116.6 kg), SpO2 96 %.    HEENT: No thrush, 3 mm healing ulcer at the left buccal mucosa Resp: Lungs clear bilaterally Cardio: Regular rate and rhythm, 2/6 systolic murmur GI: No hepatosplenomegaly, firmness in the right greater than left abdomen, no apparent ascites, no discrete mass Vascular: The right lower leg is slightly larger than the left side.  No edema or erythema  Skin: Ecchymoses over the arms, callus formation with dryness at the soles  Portacath/PICC-without erythema  Lab Results:  Lab Results  Component Value Date   WBC 5.9 10/04/2022   HGB 11.0 (L) 10/04/2022   HCT 33.2 (L) 10/04/2022   MCV 93.3 10/04/2022   PLT 124 (L) 10/04/2022   NEUTROABS 3.7 10/04/2022    CMP  Lab Results  Component Value Date   NA 137 09/20/2022   K 3.7 09/20/2022   CL 105 09/20/2022   CO2 25 09/20/2022   GLUCOSE 100 (H) 09/20/2022   BUN 13 09/20/2022   CREATININE 0.92 09/20/2022   CALCIUM 8.8 (L) 09/20/2022   PROT 6.1 (L) 09/20/2022   ALBUMIN 3.7 09/20/2022   AST 14 (L) 09/20/2022   ALT 10 09/20/2022   ALKPHOS 44 09/20/2022   BILITOT 0.5 09/20/2022   GFRNONAA >60 09/20/2022   GFRAA 74 05/20/2020    Lab Results  Component Value Date   CEA1 <0.6 04/13/2022    Medications: I have reviewed the patient's current medications.   Assessment/Plan: Gastric cancer with CT evidence of abdominal carcinomatosis CT abdomen/pelvis 04/13/2022-ascites,  diffuse omental and peritoneal nodularity, possible circumferential mass in the transverse colon, new small left greater than right pleural effusions CT abdominal mass biopsy 04/24/2022-metastatic poorly differentiated adenocarcinoma with very focal signet ring cell features CT paracentesis 04/24/2022-no malignant cells identified Colonoscopy 04/25/2022-diverticulosis in the sigmoid colon.  Internal hemorrhoids.  No specimens collected. Upper endoscopy 04/25/2022-large infiltrative mass with oozing bleeding and stigmata of recent bleeding in the gastric fundus, on the anterior wall of the gastric body and on the greater curvature of the gastric body; deformity in the gastric antrum.  Extrinsic deformity in the entire duodenum.  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 Her2 by IHC negative, 1+; MMR IHC normal; PD-L1 CPS 1%, positive.  Cycle 1 FOLFOX 05/04/2022 Cycle 2 FOLFOX 05/17/2022 Cycle 3 FOLFOX 05/31/2022-oxaliplatin dose reduced secondary to prolonged cold sensitivity Cycle 4 FOLFOX 06/14/2022 Cycle 5 FOLFOX 06/28/2022 CT abdomen/pelvis 07/08/2022-asymmetric wall thickening at the lesser curvature of the stomach, decreased ascites, increased diffuse omental and peritoneal nodularity Cycle 6 FOLFOX 07/12/2022 Cycle 7 FOLFOX 08/09/2022 Cycle 8 FOLFOX 08/23/2022 Cycle 9 FOLFOX 09/06/2022 CTs 09/11/2022-stable, no new or progressive interval findings Cycle 10 FOLFOX 09/20/2022 Xeloda maintenance 2 weeks on/1 week off 10/16/2022 Truncal rash-status post recent punch biopsy with pathology pending; biopsy reported negative per family 04/26/2022 Lichen planus of the feet Hypothyroidism Atrial fibrillation Admission 07/24/2022 with acute bilateral pulmonary embolism/right heart strain Heparin anticoagulation 07/24/2022 Doppler 07/24/2022-acute DVT of the right femoral, popliteal, posterior  tibial, and peroneal veins, acute left posterior tibial DVT 7.  Anemia  secondary to chemotherapy, phlebotomy, and hemoptysis-improved    Disposition: Mr. Airey has completed 10 cycles of FOLFOX.  His clinical status appears unchanged today.  We discussed infusional 5-FU and Xeloda maintenance.  He prefers Xeloda.  We reviewed potential toxicities associated with Xeloda including the chance of mucositis, diarrhea, hematologic toxicity, rash, hyperpigmentation, sun sensitivity, and hand/foot syndrome.  The plan is to begin Xeloda maintenance on 10/16/2022.  He will hold Xeloda and contact us if he develops diarrhea or hand/foot pain.  He will be scheduled for an office and lab visit on 11/03/2022.  Thornton Papas, MD  10/04/2022  10:20 AM

## 2022-10-05 ENCOUNTER — Other Ambulatory Visit: Payer: Self-pay

## 2022-10-05 ENCOUNTER — Other Ambulatory Visit (HOSPITAL_COMMUNITY): Payer: Self-pay

## 2022-10-05 MED ORDER — CAPECITABINE 500 MG PO TABS
ORAL_TABLET | ORAL | 0 refills | Status: DC
Start: 2022-10-16 — End: 2022-10-25
  Filled 2022-10-05: qty 98, fill #0
  Filled 2022-10-05: qty 98, 21d supply, fill #0

## 2022-10-05 NOTE — Telephone Encounter (Signed)
Oral Oncology Pharmacy Student Encounter  Received new prescription for Xeloda (capecitabine) for maintenance therapy of Stage IV Gastric cancer, planned duration until disease progression or unacceptable toxicity.  Labs (CBC & CMP) from 10/04/22 assessed, no relevant findings. Prescription dose and frequency assessed.   Current medication list in Epic reviewed, 2 DDIs with capecitabine identified: Omeprazole (decrease capecitabine efficacy, monitor therapy) Ondansetron (QTc prolongation, monitor therapy)  Evaluated chart and no patient barriers to medication adherence identified.   Prescription has been e-scribed to the [facility] for benefits analysis and approval.  Oral Oncology Clinic will continue to follow for insurance authorization, copayment issues, initial counseling and start date.   Pearletha Forge, PharmD Candidate 2025 [place]/DB/AP Oral Chemotherapy Navigation Clinic [phone]  10/05/2022 8:39 AM

## 2022-10-05 NOTE — Telephone Encounter (Signed)
Oral Chemotherapy Pharmacist Encounter  Patient Education I spoke with patient for overview of new oral chemotherapy medication:  Xeloda (capecitabine) for maintenance therapy of Stage IV Gastric cancer, planned duration until disease progression or unacceptable toxicity.   Counseled patient on administration, dosing, side effects, monitoring, drug-food interactions, safe handling, storage, and disposal. Patient will take 4 tablets (2000 mg) by mouth in AM and 3 tablets (1500 mg) in PM. Take with food. Take for 14 days, then hold for 7 days. Repeat every 21 days. .  Side effects include but not limited to: diarrhea, mouth irritation/sores, hand-and-foot syndrome, nausea, reduced blood counts (WBC, RBC, PLT). Patient reports utilizing urea based hand cream at home  No relevant concerns on CBC from 10/04/22    Reviewed with patient importance of keeping a medication schedule and plan for any missed doses.  After discussion with patient no patient barriers to medication adherence identified.   Mr. Depetris voiced understanding and appreciation. All questions answered. Medication handout provided.  Provided patient with Oral Chemotherapy Navigation Clinic phone number. Patient knows to call the office with questions or concerns. Oral Chemotherapy Navigation Clinic will continue to follow.  Pearletha Forge, PharmD Candidate 2025 [place]/DB/AP Oral Chemotherapy Navigation Clinic [phone]  10/05/2022 10:50 AM

## 2022-10-05 NOTE — Telephone Encounter (Signed)
Patient successfully OnBoarded and drug education provided by pharmacist. Capecitabine scheduled to be shipped on 10/06/22 to be delivered on 10/09/22 from [facility] to patient's address. Patient also knows to call me at [phone] with any questions or concerns regarding receiving medication or if there are any changes to co-pay.    Ardeen Fillers, CPhT Oncology Pharmacy Patient Advocate  [facility]  [phone] (phone) [phone] (fax) 10/05/2022 10:59 AM

## 2022-10-05 NOTE — Telephone Encounter (Signed)
[employer] to deliver medication by 10/10/22. He knows the plan is to start on 10/16/22

## 2022-10-07 IMAGING — DX DG CHEST 2V
2 series · 2 of 2 positions shown · non-contrast
Comparison: 01/22/2020

CLINICAL DATA: Follow-up pneumonia

EXAM:
CHEST - 2 VIEW

[chest pa]
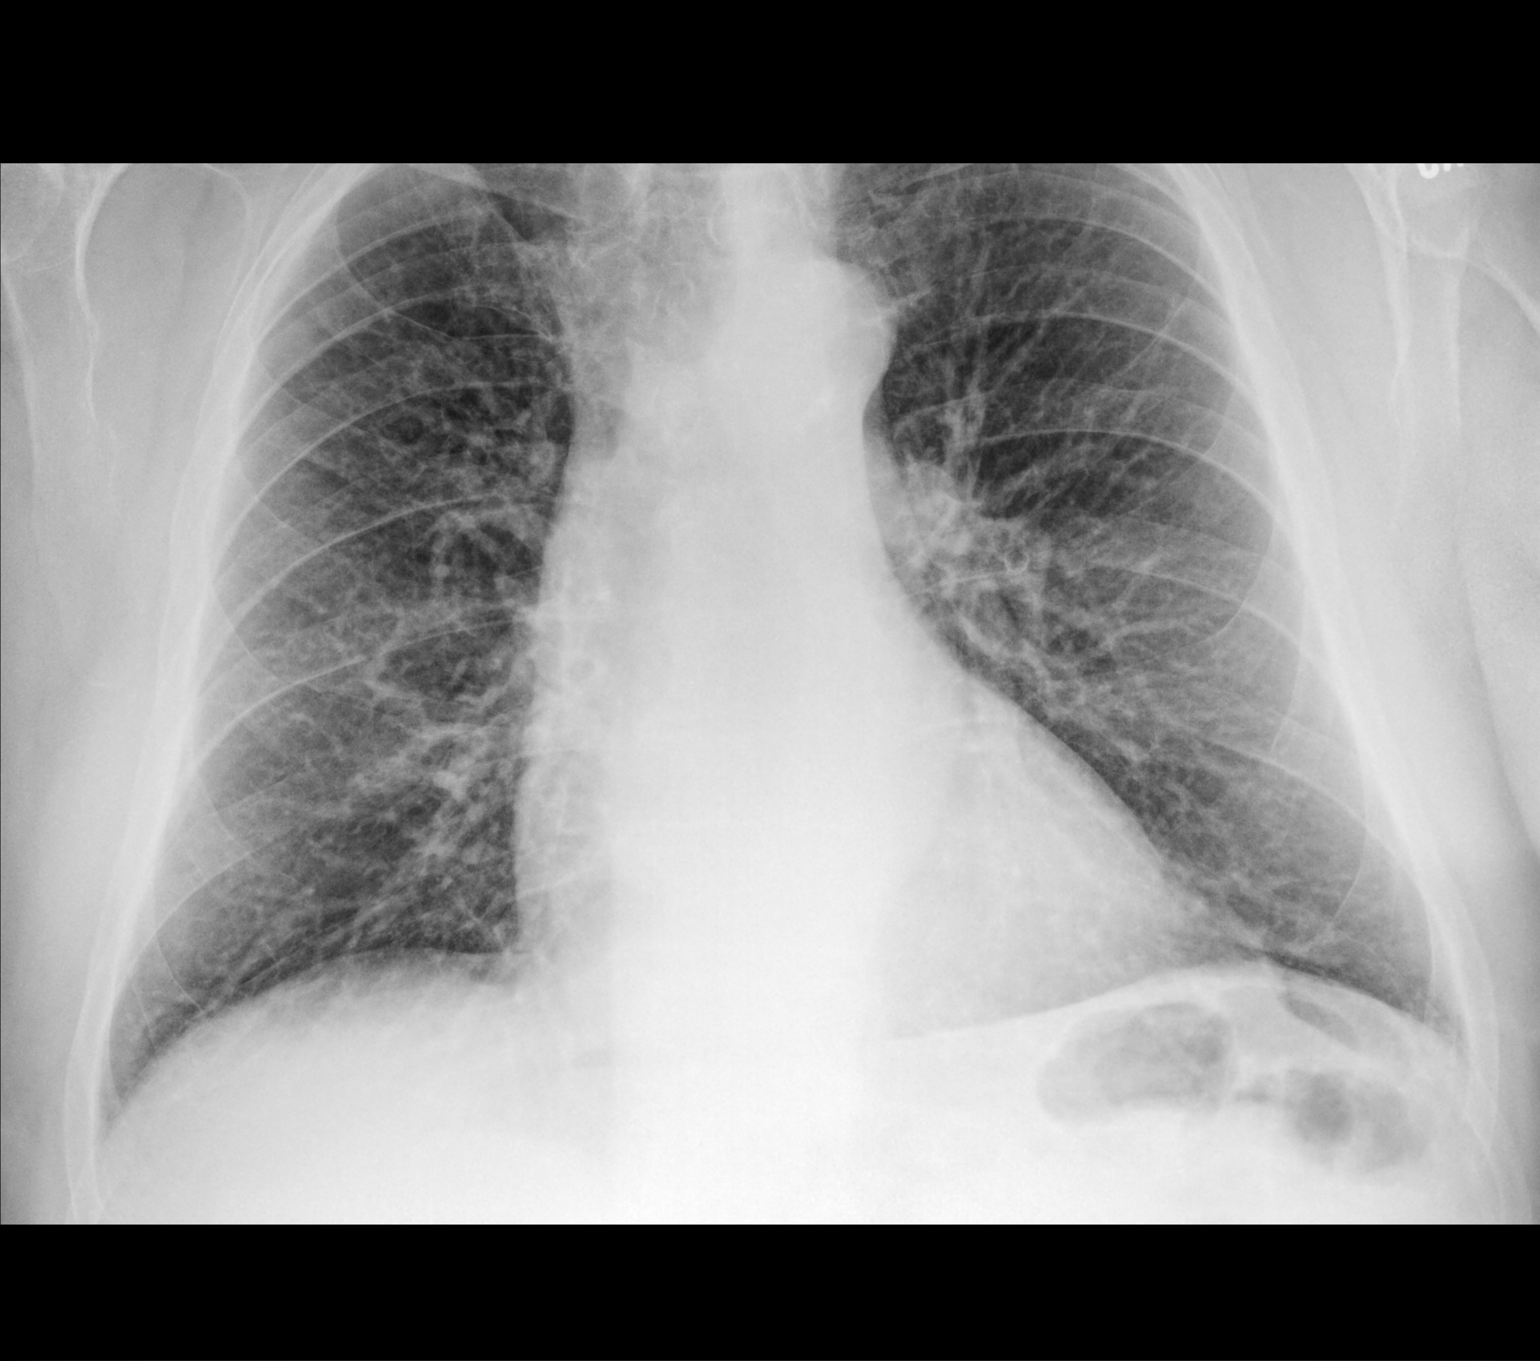

[chest lat]
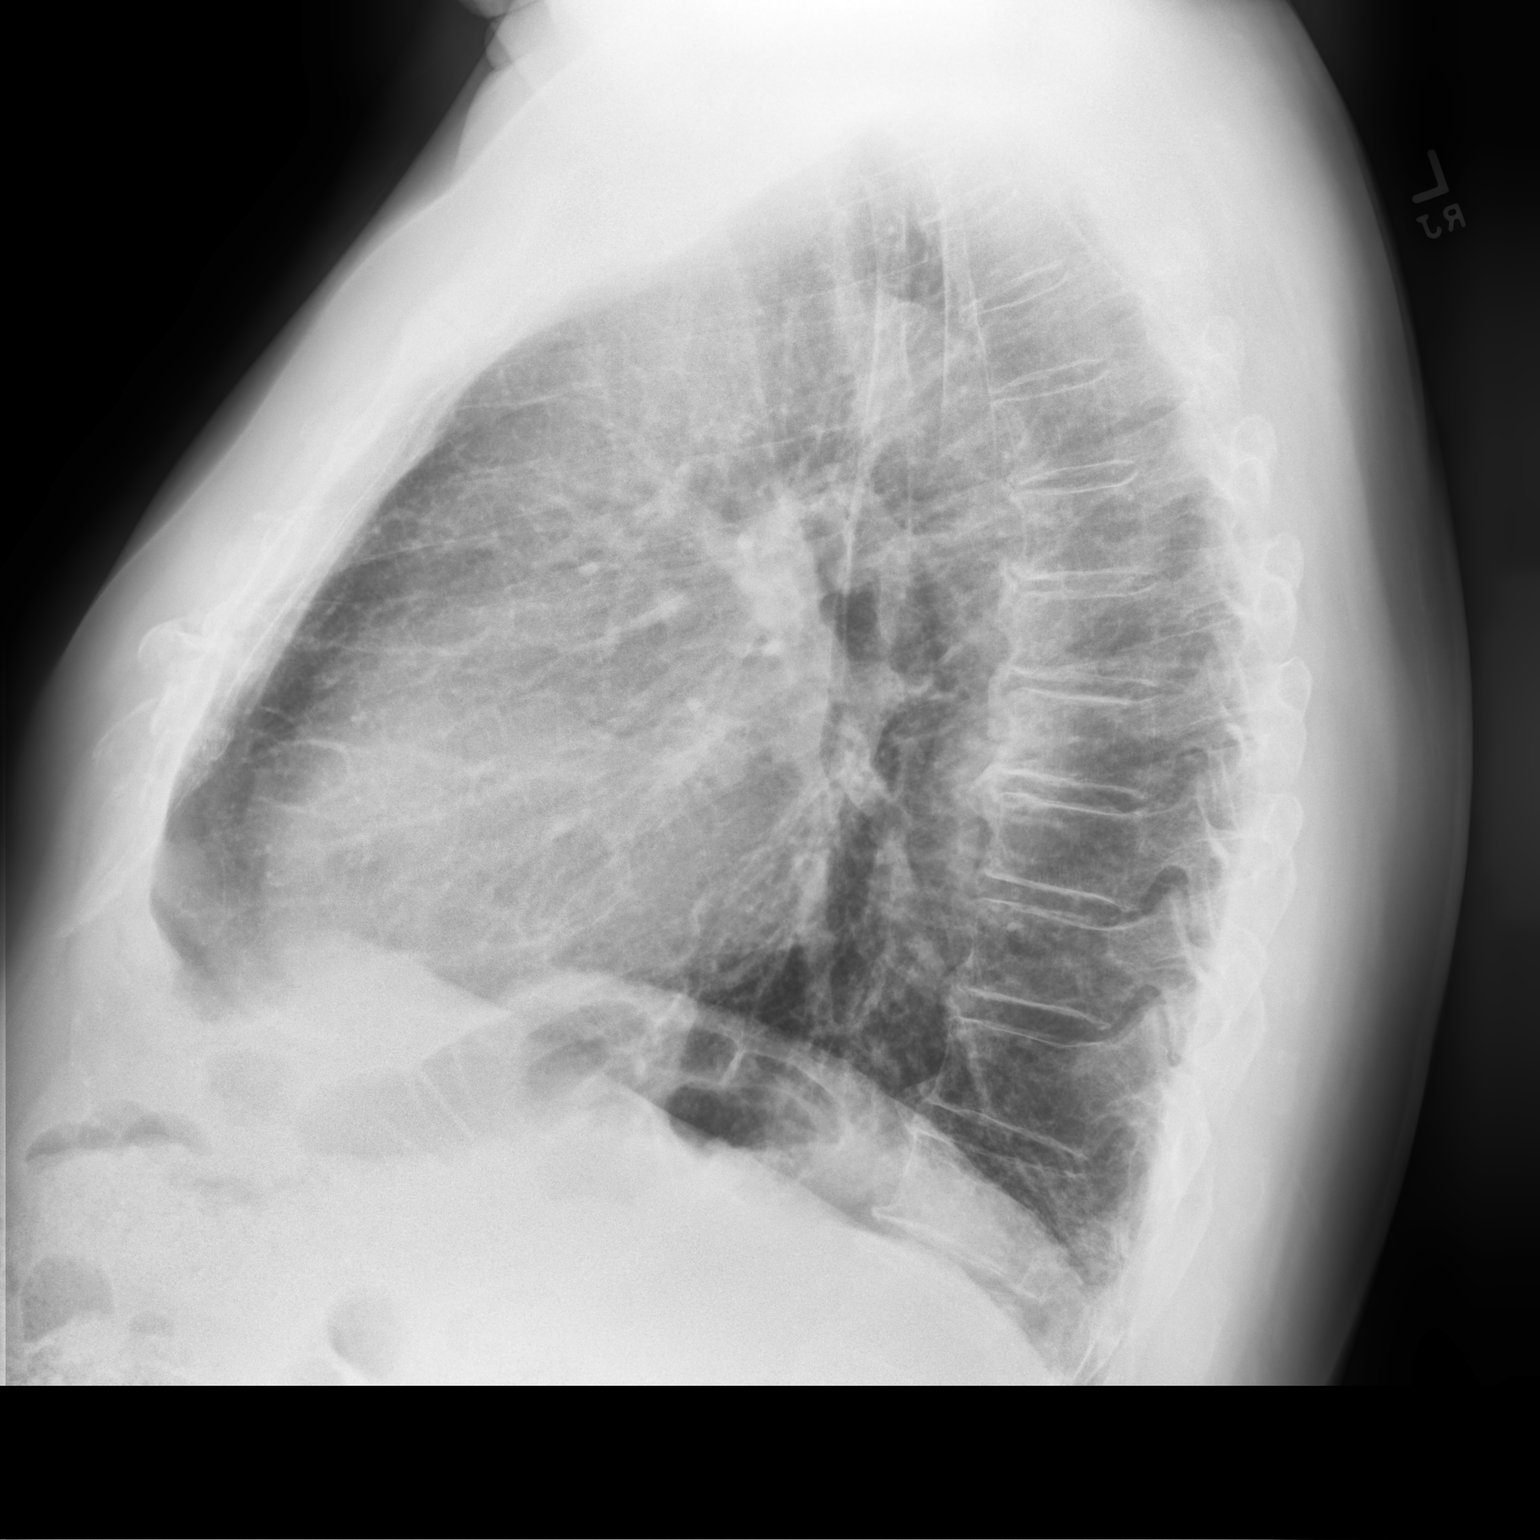

[2 of 2 positions shown; findings below may reference images not displayed]

FINDINGS: Cardiac shadow is within normal limits. The lungs are well aerated
bilaterally. No focal confluent infiltrate is seen. Previously noted
infiltrate on CT is not well appreciated. No sizable effusion is
noted. No bony abnormality is seen.
IMPRESSION: No acute abnormality noted.

## 2022-10-12 ENCOUNTER — Encounter: Payer: Self-pay | Admitting: Cardiology

## 2022-10-13 ENCOUNTER — Telehealth: Payer: Self-pay

## 2022-10-13 ENCOUNTER — Other Ambulatory Visit: Payer: Self-pay

## 2022-10-13 DIAGNOSIS — C168 Malignant neoplasm of overlapping sites of stomach: Secondary | ICD-10-CM

## 2022-10-13 MED ORDER — METHYLPREDNISOLONE 4 MG PO TBPK
ORAL_TABLET | ORAL | 0 refills | Status: DC
Start: 2022-10-13 — End: 2022-11-15

## 2022-10-13 NOTE — Telephone Encounter (Addendum)
A patient contacted the office to inform us of a rash that developed a week after their last chemotherapy treatment on 09/20/22. The rash is present on the patient's back, legs, and arms, and is causing itchiness and redness. The patient is scheduled to begin Xeloda treatment on 10/16/22.

## 2022-10-21 ENCOUNTER — Other Ambulatory Visit: Payer: Self-pay

## 2022-10-21 ENCOUNTER — Emergency Department (HOSPITAL_BASED_OUTPATIENT_CLINIC_OR_DEPARTMENT_OTHER): Payer: Medicare HMO | Admitting: Radiology

## 2022-10-21 ENCOUNTER — Emergency Department (HOSPITAL_BASED_OUTPATIENT_CLINIC_OR_DEPARTMENT_OTHER)
Admission: EM | Admit: 2022-10-21 | Discharge: 2022-10-21 | Disposition: A | Discharge status: Home / Self Care | Payer: Medicare HMO | Attending: Emergency Medicine | Admitting: Emergency Medicine

## 2022-10-21 ENCOUNTER — Encounter (HOSPITAL_BASED_OUTPATIENT_CLINIC_OR_DEPARTMENT_OTHER): Payer: Self-pay | Admitting: Emergency Medicine

## 2022-10-21 DIAGNOSIS — S99921A Unspecified injury of right foot, initial encounter: Secondary | ICD-10-CM

## 2022-10-21 DIAGNOSIS — W1839XA Other fall on same level, initial encounter: Secondary | ICD-10-CM | POA: Insufficient documentation

## 2022-10-21 DIAGNOSIS — T148XXA Other injury of unspecified body region, initial encounter: Secondary | ICD-10-CM

## 2022-10-21 DIAGNOSIS — C169 Malignant neoplasm of stomach, unspecified: Secondary | ICD-10-CM | POA: Insufficient documentation

## 2022-10-21 DIAGNOSIS — Z7951 Long term (current) use of inhaled steroids: Secondary | ICD-10-CM | POA: Insufficient documentation

## 2022-10-21 DIAGNOSIS — W19XXXA Unspecified fall, initial encounter: Secondary | ICD-10-CM

## 2022-10-21 DIAGNOSIS — R0789 Other chest pain: Secondary | ICD-10-CM | POA: Insufficient documentation

## 2022-10-21 DIAGNOSIS — S91111A Laceration without foreign body of right great toe without damage to nail, initial encounter: Secondary | ICD-10-CM | POA: Insufficient documentation

## 2022-10-21 DIAGNOSIS — J45909 Unspecified asthma, uncomplicated: Secondary | ICD-10-CM | POA: Diagnosis not present

## 2022-10-21 MED ORDER — CEPHALEXIN 250 MG PO CAPS
500.0000 mg | ORAL_CAPSULE | Freq: Once | ORAL | Status: AC
  Administered 2022-10-21: 500 mg via ORAL
  Filled 2022-10-21: qty 2

## 2022-10-21 MED ORDER — CEPHALEXIN 500 MG PO CAPS: 500.0000 mg | ORAL_CAPSULE | Freq: Four times a day (QID) | ORAL | 0 refills | Status: AC

## 2022-10-21 NOTE — ED Provider Notes (Signed)
[place] EMERGENCY DEPARTMENT AT [facility] Provider Note   CSN: [id number] Arrival date & time: 10/21/22  1725     History {Add pertinent medical, surgical, social history, OB history to HPI:1} No chief complaint on file.   Todd Harrison is a 77 y.o. male.  HPI     Home Medications Prior to Admission medications   Medication Sig Start Date End Date Taking? Authorizing Provider  albuterol (VENTOLIN HFA) 108 (90 Base) MCG/ACT inhaler Inhale 2 puffs into the lungs every 6 (six) hours as needed for wheezing or shortness of breath. Patient not taking: Reported on 10/04/2022 06/23/22   Rana Snare, NP  capecitabine (XELODA) 500 MG tablet Take 4 tablets (2000 mg) by mouth in AM and 3 tablets (1500 mg) in PM. Take with food. Take for 14 days, then hold for 7 days. Repeat every 21 days. 10/16/22   Ladene Artist, MD  enoxaparin (LOVENOX) 120 MG/0.8ML injection Inject 0.8 mLs (120 mg total) into the skin every 12 (twelve) hours. 07/28/22 07/28/23  Pokhrel, Rebekah Chesterfield, MD  fluticasone (FLONASE) 50 MCG/ACT nasal spray Place 1 spray into both nostrils daily as needed for allergies or rhinitis.    [provider]  fluticasone furoate-vilanterol (BREO ELLIPTA) 100-25 MCG/ACT AEPB Inhale 1 puff into the lungs daily. Patient not taking: Reported on 10/04/2022 07/03/22   Josephine Igo, DO  hydroxychloroquine (PLAQUENIL) 200 MG tablet Take 200 mg by mouth 2 (two) times daily. 06/17/21   [provider]  ISOtretinoin (ACCUTANE) 40 MG capsule Take 40 mg by mouth 2 (two) times daily.  Patient not taking: Reported on 10/04/2022 01/13/20   [provider]  KLOR-CON M20 20 MEQ tablet TAKE 1 TABLET BY MOUTH EVERY DAY 06/12/22   Ladene Artist, MD  levothyroxine (SYNTHROID) 112 MCG tablet Take 112 mcg by mouth daily before breakfast.    [provider]  lidocaine-prilocaine (EMLA) cream Apply 1 Application topically as needed (Apply 1 hour before coming to [facility] for accessing). 04/27/22   Ladene Artist, MD  methylPREDNISolone (MEDROL DOSEPAK) 4 MG TBPK tablet Take 32 mg daily for 1 day, then 20 mg daily for 2 days, then 12 mg daily for 2 days, then 8 mg daily for 1 day then stop 10/13/22   Ladene Artist, MD  metoprolol succinate (TOPROL-XL) 50 MG 24 hr tablet TAKE 1 TABLET BY MOUTH EVERY DAY 06/13/22   Lanier Prude, MD  montelukast (SINGULAIR) 10 MG tablet TAKE 1 TABLET BY MOUTH EVERYDAY AT BEDTIME Patient not taking: Reported on 10/04/2022 09/09/22   Josephine Igo, DO  omeprazole (PRILOSEC) 40 MG capsule Take 40 mg by mouth daily. 04/12/22   [provider]  ondansetron (ZOFRAN) 8 MG tablet Take 1 tablet (8 mg total) by mouth every 8 (eight) hours as needed for nausea or vomiting. Do not take within 72 hours of day 1 chemo 09/20/22   Rana Snare, NP  polyethylene glycol (MIRALAX / GLYCOLAX) 17 g packet Take 17 g by mouth daily.    [provider]  prochlorperazine (COMPAZINE) 10 MG tablet Take 1 tablet (10 mg total) by mouth every 6 (six) hours as needed for nausea or vomiting. Patient not taking: Reported on 10/04/2022 08/23/22   Rana Snare, NP  traMADol (ULTRAM) 50 MG tablet Take 1 tablet (50 mg total) by mouth every 6 (six) hours as needed. Patient not taking: Reported on 09/13/2022 07/21/22   Ladene Artist, MD  triamcinolone (KENALOG) 0.1 % Apply 1 application  topically daily as needed (lichen planus flare).    [provider]      Allergies    Gold and Mixed grasses    Review of Systems   Review of Systems  Physical Exam Updated Vital Signs BP 109/69   Pulse 71   Temp 98.2 F (36.8 C) (Oral)   Resp 14   SpO2 97%  Physical Exam  ED Results / Procedures / Treatments   Labs (all labs ordered are listed, but only abnormal results are displayed) Labs Reviewed - No data to display  EKG None  Radiology No results found.  Procedures Procedures  {Document cardiac monitor, telemetry  assessment procedure when appropriate:1}  Medications Ordered in ED Medications - No data to display  ED Course/ Medical Decision Making/ A&P   {   Click here for ABCD2, HEART and other calculatorsREFRESH Note before signing :1}                          Medical Decision Making Amount and/or Complexity of Data Reviewed Radiology: ordered.    Todd Harrison is a 77 y.o. male with a past medical history significant for atrial fibrillation on Lovenox, GERD, pulmonary emboli, gastric cancer on chemotherapy, asthma, sleep apnea, and history of lichen planus who presents with foot injury.  According to patient, yesterday afternoon about 2 PM and [name] and he hurt his legs.  He reports his toes started bleeding as they had avulsion of tissue on the underside of his first 3 toes on the right and a small scrape on the left.  He that he also scraped his left shin but it is not hurting.  He denies any headache, neck pain, or abdominal pain or hip pain.  He does report some mild soreness in his right chest when he coughs after the fall as he did his right chest.  He reports he is not having shortness of breath otherwise and does not feel like his pain previously from the blood clots.  He denies any nausea, vomiting, or head injury.  Denies any neck pain or neck stiffness.  He has some chronic neuropathies as well he reports.  On exam, lungs were clear.  Right chest was tender to palpation but abdomen was nontender.  Back was nontender.  Neck nontender.  No focal neurologic deficits.  Hips nontender.  Knees nontender.  Abrasion/scrape on the left shin that is nontender.  No crepitance.  Patient was not actively bleeding but he has avulsion to tissue on the underside of his right great, second, and third toe.  Appears hemostatic now.  Intact pulses and his sensation and strength appear to be at baseline.  Will get x-rays to look for bony injury  and foreign bodies.  As his injuries occurred yesterday afternoon, it is greater than 24 hours so would not do any laceration.  If there was a large laceration but this is primarily avulsions and not amenable to lacerations.  Will have the wounds cleaned and dressed with skin tear bandaging and due to the multiple injuries and missing tissue on chemotherapy, will start on antibiotics if patient is able to discharge home.    {Document critical care time when appropriate:1} {Document review of labs and clinical decision tools ie heart score, Chads2Vasc2 etc:1}  {Document your independent review of radiology images, and any outside  records:1} {Document your discussion with family members, caretakers, and with consultants:1} {Document social determinants of health affecting pt's care:1} {Document your decision making why or why not admission, treatments were needed:1} Final Clinical Impression(s) / ED Diagnoses Final diagnoses:  None    Rx / DC Orders ED Discharge Orders     None

## 2022-10-21 NOTE — ED Triage Notes (Signed)
Fell on [place] at [employer]. Shoe came off going down step . Right 2nd toe is injured/not painful.

## 2022-10-21 NOTE — ED Triage Notes (Signed)
Pt is on blood thinner for afib and tetanus is up to date

## 2022-10-21 NOTE — ED Notes (Signed)
Pt received AVS; Educated infection monitoring and wound care. Pt verbalized understanding and and no further questions upon discharge.

## 2022-10-21 NOTE — ED Notes (Signed)
Family at beside, pt denies pain; call light in reach.

## 2022-10-21 NOTE — Discharge Instructions (Signed)
Your history, exam, and evaluation today are consistent with multiple skin tears from the fall yesterday.  As they are on your feet and as you get treated with your chemotherapy I want to make sure you do not get infection set in so please take the antibiotics to prevent this.  Please follow-up with your primary doctor for reassessment in the next several days.  You also have likely musculoskeletal chest wall discomfort but no evidence of acute rib fracture.  Please continue to take deep breaths and use over-the-counter pain medicine to help with symptoms.  If any symptoms change or worsen acutely, please return to the nearest emergency department.

## 2022-10-22 IMAGING — DX DG CHEST 2V
2 series · 2 of 2 positions shown · non-contrast
Comparison: None.

CLINICAL DATA: Pneumonia

EXAM:
CHEST - 2 VIEW

[chest pa]
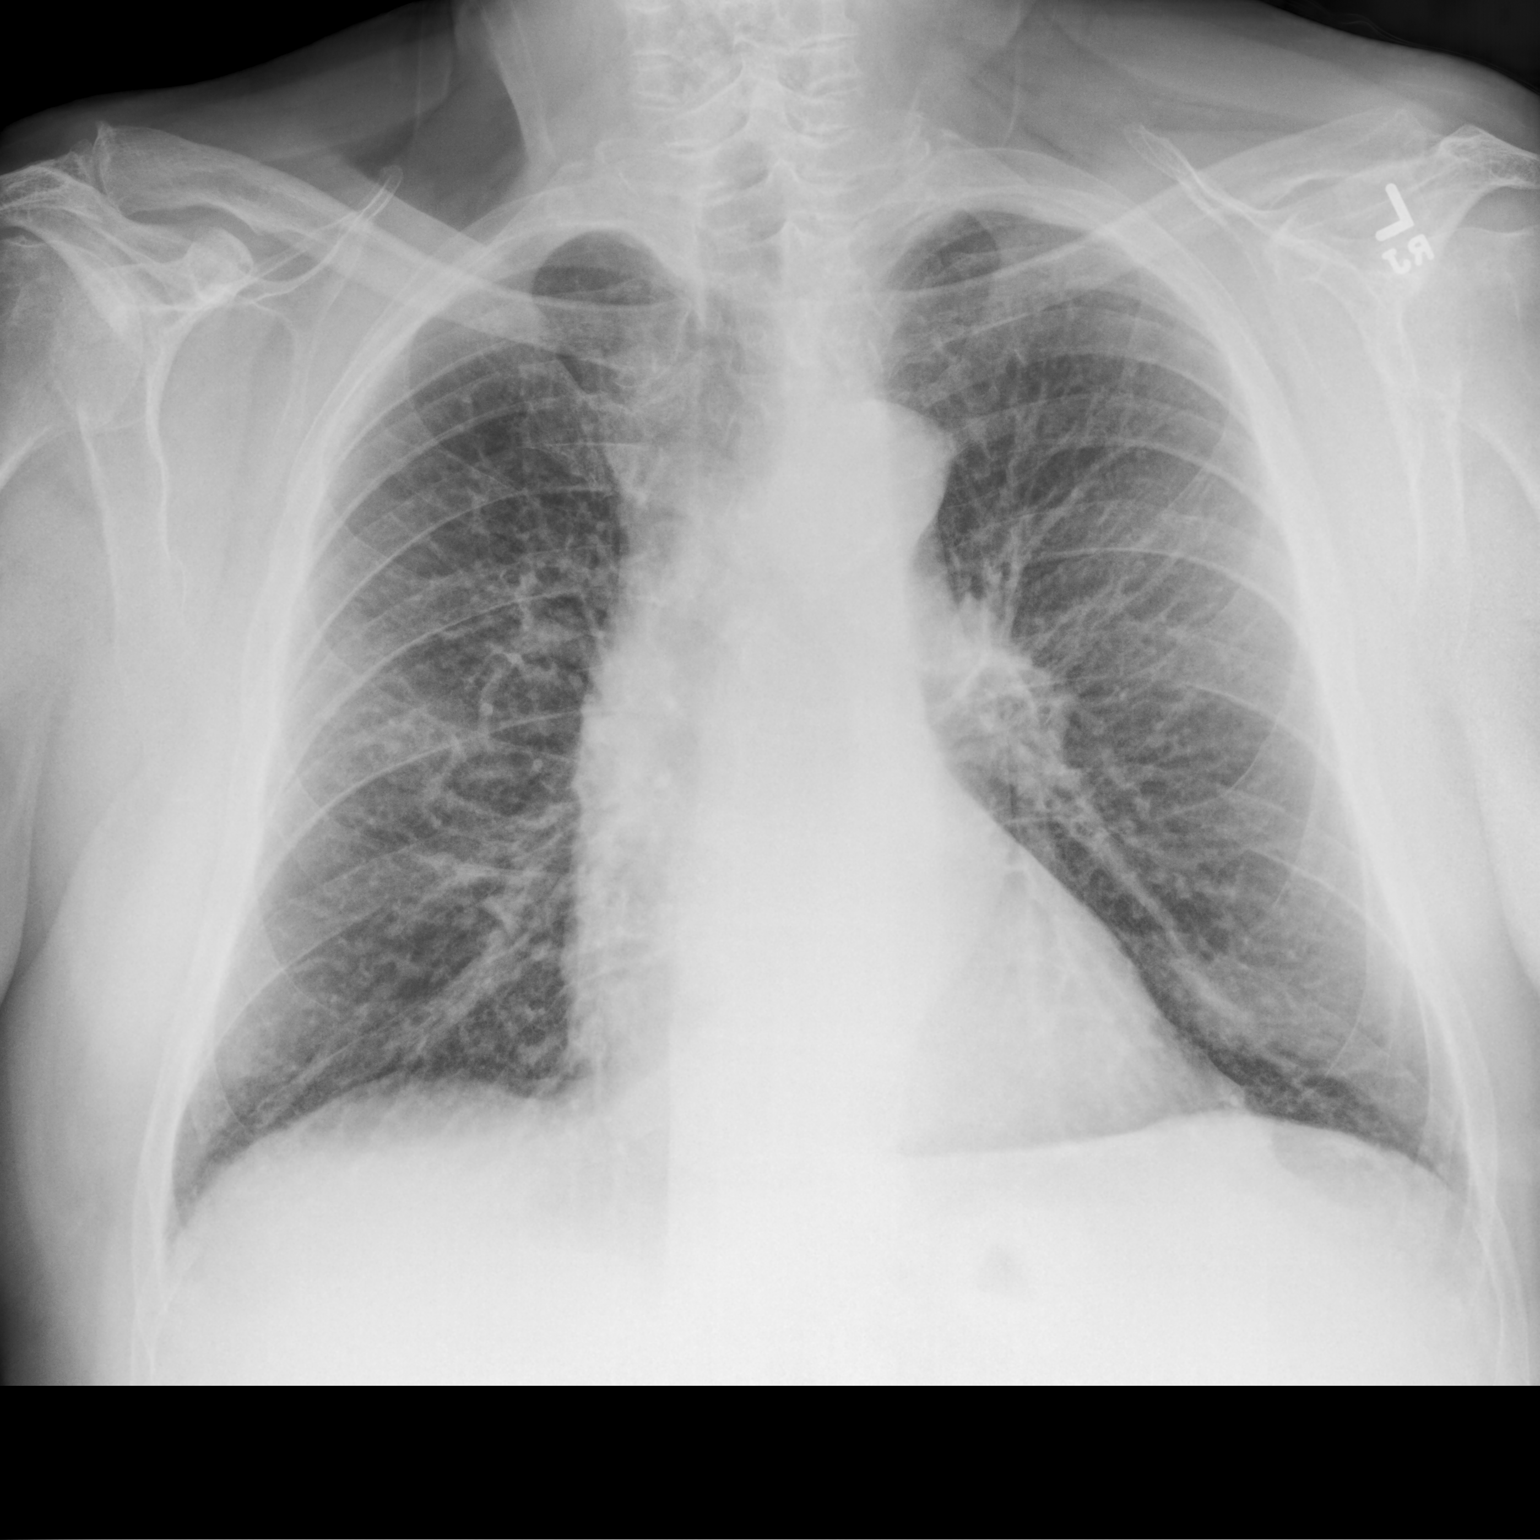

[chest lat]
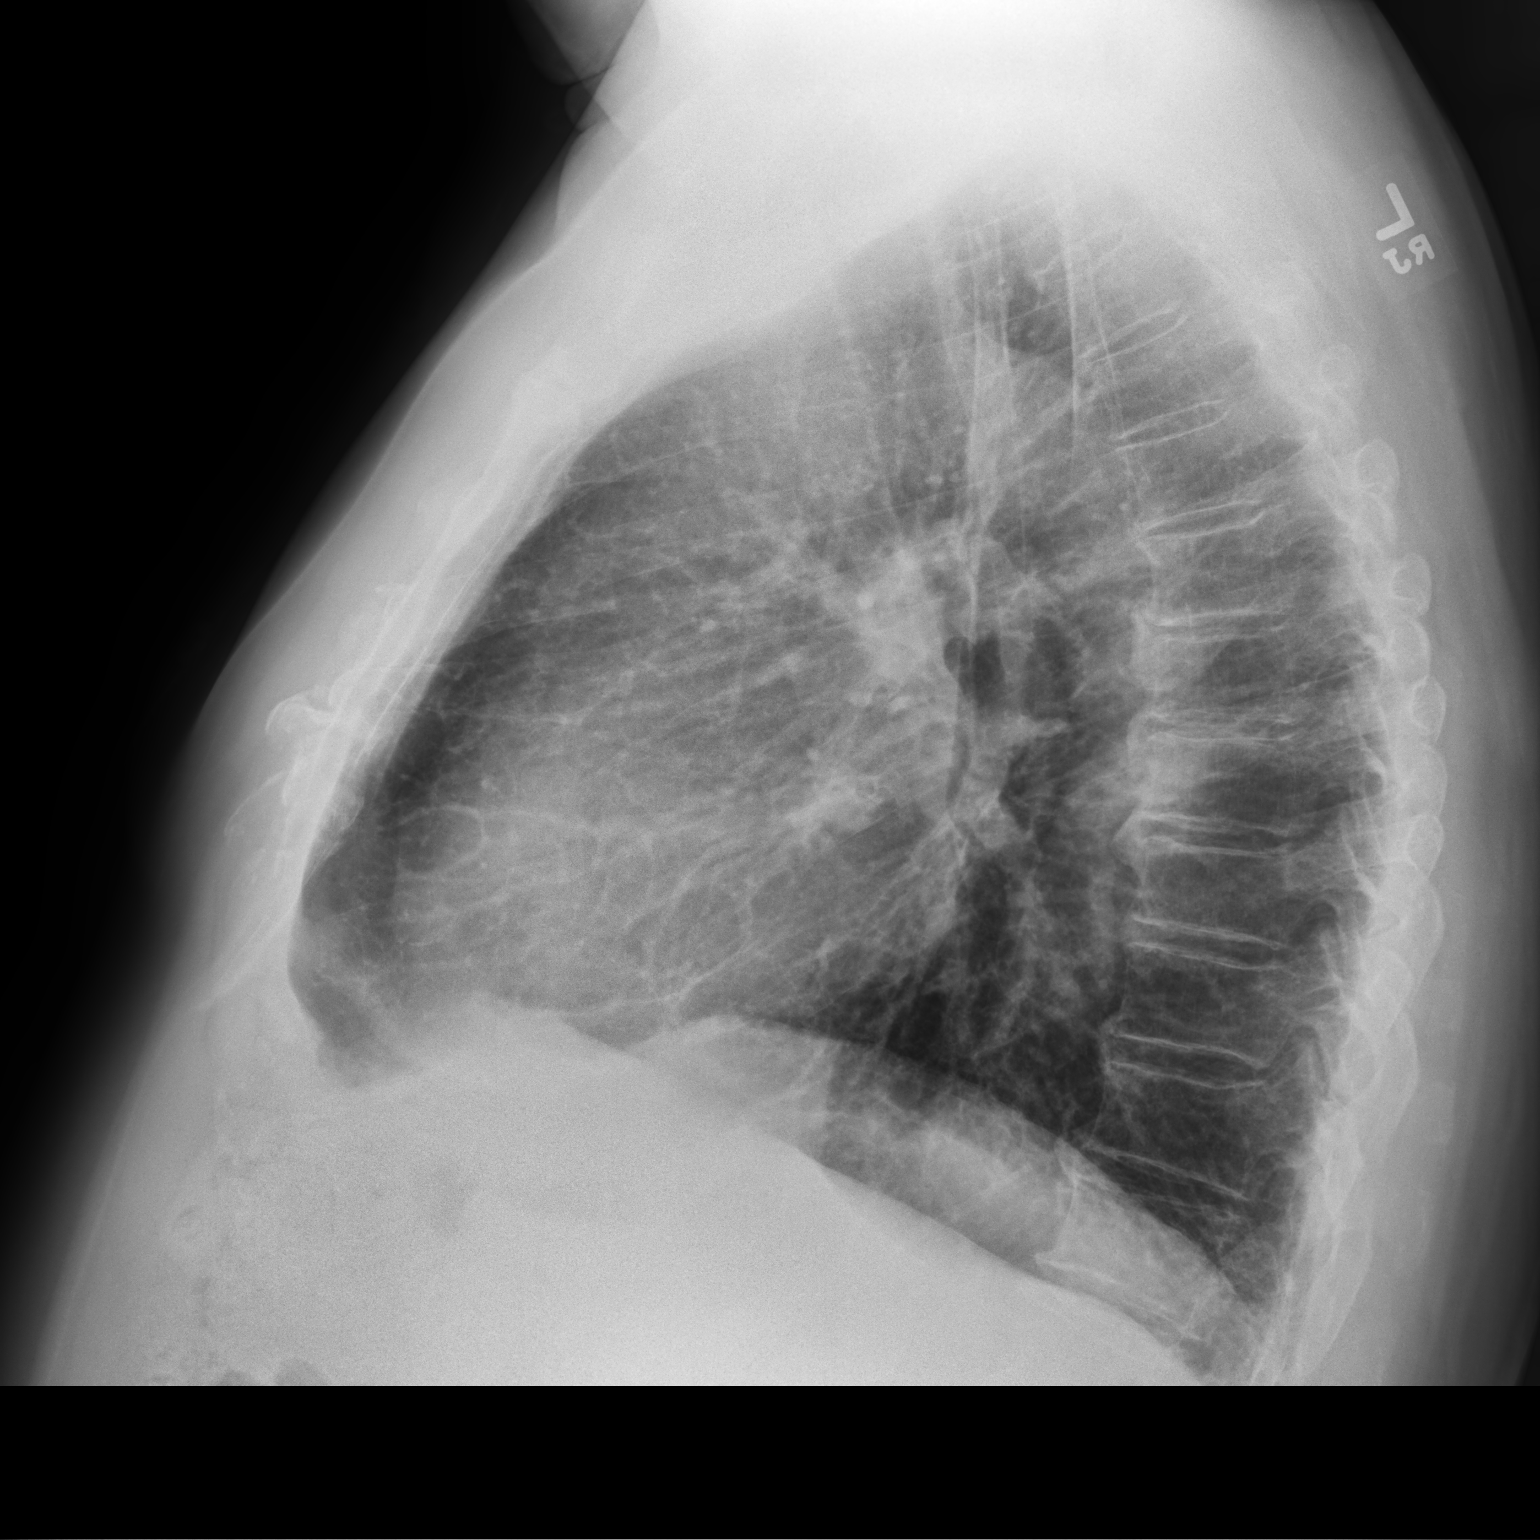

[2 of 2 positions shown; findings below may reference images not displayed]

FINDINGS: Normal mediastinum and cardiac silhouette. Normal pulmonary
vasculature. No evidence of effusion, infiltrate, or pneumothorax.
No acute bony abnormality. Degenerative osteophytosis of the spine.
IMPRESSION: No acute cardiopulmonary process.

## 2022-10-22 NOTE — ED Provider Notes (Incomplete)
[place] EMERGENCY DEPARTMENT AT [facility] Provider Note   CSN: [id number] Arrival date & time: 10/21/22  1725     History {Add pertinent medical, surgical, social history, OB history to HPI:1} No chief complaint on file.   Todd Harrison is a 77 y.o. male.  HPI     Home Medications Prior to Admission medications   Medication Sig Start Date End Date Taking? Authorizing Provider  albuterol (VENTOLIN HFA) 108 (90 Base) MCG/ACT inhaler Inhale 2 puffs into the lungs every 6 (six) hours as needed for wheezing or shortness of breath. Patient not taking: Reported on 10/04/2022 06/23/22   Rana Snare, NP  capecitabine (XELODA) 500 MG tablet Take 4 tablets (2000 mg) by mouth in AM and 3 tablets (1500 mg) in PM. Take with food. Take for 14 days, then hold for 7 days. Repeat every 21 days. 10/16/22   Ladene Artist, MD  enoxaparin (LOVENOX) 120 MG/0.8ML injection Inject 0.8 mLs (120 mg total) into the skin every 12 (twelve) hours. 07/28/22 07/28/23  Pokhrel, Rebekah [place], MD  fluticasone (FLONASE) 50 MCG/ACT nasal spray Place 1 spray into both nostrils daily as needed for allergies or rhinitis.    [provider]  fluticasone furoate-vilanterol (BREO ELLIPTA) 100-25 MCG/ACT AEPB Inhale 1 puff into the lungs daily. Patient not taking: Reported on 10/04/2022 07/03/22   Josephine Igo, DO  hydroxychloroquine (PLAQUENIL) 200 MG tablet Take 200 mg by mouth 2 (two) times daily. 06/17/21   [provider]  ISOtretinoin (ACCUTANE) 40 MG capsule Take 40 mg by mouth 2 (two) times daily.  Patient not taking: Reported on 10/04/2022 01/13/20   [provider]  KLOR-CON M20 20 MEQ tablet TAKE 1 TABLET BY MOUTH EVERY DAY 06/12/22   Ladene Artist, MD  levothyroxine (SYNTHROID) 112 MCG tablet Take 112 mcg by mouth daily before breakfast.    [provider]  lidocaine-prilocaine (EMLA) cream Apply 1 Application topically as needed (Apply 1 hour before coming to [facility] for accessing). 04/27/22   Ladene Artist, MD  methylPREDNISolone (MEDROL DOSEPAK) 4 MG TBPK tablet Take 32 mg daily for 1 day, then 20 mg daily for 2 days, then 12 mg daily for 2 days, then 8 mg daily for 1 day then stop 10/13/22   Ladene Artist, MD  metoprolol succinate (TOPROL-XL) 50 MG 24 hr tablet TAKE 1 TABLET BY MOUTH EVERY DAY 06/13/22   Lanier Prude, MD  montelukast (SINGULAIR) 10 MG tablet TAKE 1 TABLET BY MOUTH EVERYDAY AT BEDTIME Patient not taking: Reported on 10/04/2022 09/09/22   Josephine Igo, DO  omeprazole (PRILOSEC) 40 MG capsule Take 40 mg by mouth daily. 04/12/22   [provider]  ondansetron (ZOFRAN) 8 MG tablet Take 1 tablet (8 mg total) by mouth every 8 (eight) hours as needed for nausea or vomiting. Do not take within 72 hours of day 1 chemo 09/20/22   Rana Snare, NP  polyethylene glycol (MIRALAX / GLYCOLAX) 17 g packet Take 17 g by mouth daily.    [provider]  prochlorperazine (COMPAZINE) 10 MG tablet Take 1 tablet (10 mg total) by mouth every 6 (six) hours as needed for nausea or vomiting. Patient not taking: Reported on 10/04/2022 08/23/22   Rana Snare, NP  traMADol (ULTRAM) 50 MG tablet Take 1 tablet (50 mg total) by mouth every 6 (six) hours as needed. Patient not taking: Reported on 09/13/2022 07/21/22   Ladene Artist, MD  triamcinolone (KENALOG) 0.1 % Apply 1 application  topically daily as needed (lichen planus flare).    [provider]      Allergies    Gold and Mixed grasses    Review of Systems   Review of Systems  Physical Exam Updated Vital Signs BP 109/69   Pulse 71   Temp 98.2 F (36.8 C) (Oral)   Resp 14   SpO2 97%  Physical Exam  ED Results / Procedures / Treatments   Labs (all labs ordered are listed, but only abnormal results are displayed) Labs Reviewed - No data to display  EKG None  Radiology No results found.  Procedures Procedures  {Document cardiac monitor, telemetry  assessment procedure when appropriate:1}  Medications Ordered in ED Medications - No data to display  ED Course/ Medical Decision Making/ A&P   {   Click here for ABCD2, HEART and other calculatorsREFRESH Note before signing :1}                          Medical Decision Making Amount and/or Complexity of Data Reviewed Radiology: ordered.  Risk Prescription drug management.    Todd Harrison is a 77 y.o. male with a past medical history significant for atrial fibrillation on Lovenox, GERD, pulmonary emboli, gastric cancer on chemotherapy, asthma, sleep apnea, and history of lichen planus who presents with foot injury.  According to patient, yesterday afternoon about 2 PM and [name] and he hurt his legs.  He reports his toes started bleeding as they had avulsion of tissue on the underside of his first 3 toes on the right and a small scrape on the left.  He that he also scraped his left shin but it is not hurting.  He denies any headache, neck pain, or abdominal pain or hip pain.  He does report some mild soreness in his right chest when he coughs after the fall as he did his right chest.  He reports he is not having shortness of breath otherwise and does not feel like his pain previously from the blood clots.  He denies any nausea, vomiting, or head injury.  Denies any neck pain or neck stiffness.  He has some chronic neuropathies as well he reports.  On exam, lungs were clear.  Right chest was tender to palpation but abdomen was nontender.  Back was nontender.  Neck nontender.  No focal neurologic deficits.  Hips nontender.  Knees nontender.  Abrasion/scrape on the left shin that is nontender.  No crepitance.  Patient was not actively bleeding but he has avulsion to tissue on the underside of his right great, second, and third toe.  Appears hemostatic now.  Intact pulses and his sensation and strength appear to be at  baseline.  Will get x-rays to look for bony injury and foreign bodies.  As his injuries occurred yesterday afternoon, it is greater than 24 hours so would not do any laceration.  If there was a large laceration but this is primarily avulsions and not amenable to lacerations.  Will have the wounds cleaned and dressed with skin tear bandaging and due to the multiple injuries and missing tissue on chemotherapy, will start on antibiotics if patient is able to discharge home.    {Document critical care time when appropriate:1} {Document review of labs and clinical decision tools ie heart score, Chads2Vasc2 etc:1}  {Document your independent review of  radiology images, and any outside records:1} {Document your discussion with family members, caretakers, and with consultants:1} {Document social determinants of health affecting pt's care:1} {Document your decision making why or why not admission, treatments were needed:1} Final Clinical Impression(s) / ED Diagnoses Final diagnoses:  None    Rx / DC Orders ED Discharge Orders     None

## 2022-10-23 ENCOUNTER — Other Ambulatory Visit: Payer: Self-pay

## 2022-10-23 ENCOUNTER — Encounter: Payer: Self-pay | Admitting: Oncology

## 2022-10-25 ENCOUNTER — Other Ambulatory Visit: Payer: Self-pay | Admitting: Oncology

## 2022-10-25 ENCOUNTER — Other Ambulatory Visit (HOSPITAL_COMMUNITY): Payer: Self-pay

## 2022-10-25 ENCOUNTER — Other Ambulatory Visit: Payer: Self-pay

## 2022-10-25 DIAGNOSIS — C168 Malignant neoplasm of overlapping sites of stomach: Secondary | ICD-10-CM

## 2022-10-25 MED ORDER — CAPECITABINE 500 MG PO TABS
ORAL_TABLET | ORAL | 0 refills | Status: DC
Start: 2022-10-25 — End: 2022-11-02
  Filled 2022-10-25: qty 98, 21d supply, fill #0

## 2022-10-26 ENCOUNTER — Other Ambulatory Visit: Payer: Self-pay | Admitting: *Deleted

## 2022-10-26 ENCOUNTER — Other Ambulatory Visit: Payer: Self-pay

## 2022-10-26 MED ORDER — ENOXAPARIN SODIUM 120 MG/0.8ML IJ SOSY: 120.0000 mg | PREFILLED_SYRINGE | Freq: Two times a day (BID) | INTRAMUSCULAR | 0 refills | Status: DC

## 2022-10-26 NOTE — Telephone Encounter (Signed)
Patient sent Mychart message requesting refill on enoxaparin. OK per Dr. Truett Perna.

## 2022-10-30 ENCOUNTER — Other Ambulatory Visit (HOSPITAL_COMMUNITY): Payer: Self-pay

## 2022-11-01 ENCOUNTER — Ambulatory Visit: Payer: Medicare HMO | Admitting: Podiatry

## 2022-11-01 DIAGNOSIS — G620 Drug-induced polyneuropathy: Secondary | ICD-10-CM

## 2022-11-01 DIAGNOSIS — T451X5A Adverse effect of antineoplastic and immunosuppressive drugs, initial encounter: Secondary | ICD-10-CM | POA: Diagnosis not present

## 2022-11-01 DIAGNOSIS — L97512 Non-pressure chronic ulcer of other part of right foot with fat layer exposed: Secondary | ICD-10-CM

## 2022-11-01 NOTE — Progress Notes (Signed)
Chief Complaint  Patient presents with   Toe Injury    Patient came in today for a right foot 2nd toe ulcer, patient [place], patient was seen at Urgent Care and was given Keflex, patient states he is doing better, there is some redness and swelling     Subjective:  77 y.o. male with PMHx peripheral polyneuropathy secondary to chemotherapy, lichen planus presenting for evaluation of an ulcer that developed to the plantar aspect of the right second toe.  [place] and injured his right second toe.  DOI: 10/20/2022.  He went to the emergency department where he was evaluated, placed on oral antibiotics, and referred here.   Past Medical History:  Diagnosis Date   A-fib (HCC)    Asthma    Cataract    Gastric cancer (HCC)    GERD (gastroesophageal reflux disease)    Lichen planus    Methotrexate, long term, current use    no longer taking   Psoriasis    Sleep apnea    Thyroid disease    Urticaria     Past Surgical History:  Procedure Laterality Date   CARDIOVERSION N/A 02/23/2020   Procedure: CARDIOVERSION;  Surgeon: Christell Constant, MD;  Location: [facility] ENDOSCOPY;  Service: Cardiovascular;  Laterality: N/A;   CARDIOVERSION N/A 04/12/2020   Procedure: CARDIOVERSION;  Surgeon: Jodelle Red, MD;  Location: [facility] ENDOSCOPY;  Service: Cardiovascular;  Laterality: N/A;   CATARACT EXTRACTION Left    2019   IR IMAGING GUIDED PORT INSERTION  05/03/2022   TONSILLECTOMY      Allergies  Allergen Reactions   Gold Itching   Mixed Grasses     unknown     Objective/Physical Exam General: The patient is alert and oriented x3 in no acute distress.  Dermatology:  Wound #1 noted to the plantar aspect of the right second toe underlying the DIPJ measuring approximately 0.8 x 1.2 x 0.1 cm (LxWxD). Mostly fibrotic wound base.  No appreciable drainage.  No malodor.  No exposed bone or  tendon.  There is also some hyperkeratotic callus and skin noted to the plantar aspect of the left great toe.  This is very superficial and stable.  It appears to be partial-thickness and not extending into the subcutaneous tissue.  Vascular: Palpable pedal pulses bilaterally.  There are some very mild erythema localized around the second toe wound.  No appreciable edema.  Neurological: Light touch and protective threshold diminished  Musculoskeletal Exam: No pedal deformity.  Muscle strength 5/5.  Patient able to plantarflex his second toe  Assessment: 1.  Laceration/ulcer plantar aspect of the right second toe.  DOI: 10/20/2022 2.  Peripheral polyneuropathy secondary to chemotherapy    Plan of Care:  -Patient was evaluated. Sherlynn Stalls medically necessary excisional debridement including subcutaneous tissue was performed using a tissue nipper and a chisel blade. Excisional debridement of all the necrotic nonviable tissue down to healthy bleeding viable tissue was performed with post-debridement measurements same as pre-. -Patient has Silvadene cream at home.  Recommend Silvadene cream and a Band-Aid daily -Advised against going barefoot.  Recommend good supportive tennis shoes and sneakers -Return to clinic 3 weeks   Felecia Shelling, DPM [facility]  Dr. Felecia Shelling, DPM    [address]  [place], [place] [address]                Office [phone]  Fax [phone]

## 2022-11-02 ENCOUNTER — Other Ambulatory Visit (HOSPITAL_COMMUNITY): Payer: Self-pay

## 2022-11-02 ENCOUNTER — Inpatient Hospital Stay: Payer: Medicare HMO

## 2022-11-02 ENCOUNTER — Other Ambulatory Visit: Payer: Self-pay

## 2022-11-02 ENCOUNTER — Inpatient Hospital Stay: Payer: Medicare HMO | Admitting: Oncology

## 2022-11-02 ENCOUNTER — Other Ambulatory Visit: Payer: Self-pay | Admitting: *Deleted

## 2022-11-02 ENCOUNTER — Inpatient Hospital Stay: Payer: Medicare HMO | Attending: Nurse Practitioner

## 2022-11-02 VITALS — BP 105/66 | HR 70 | Temp 98.2°F | Resp 20 | Ht 72.0 in | Wt 256.9 lb

## 2022-11-02 DIAGNOSIS — C786 Secondary malignant neoplasm of retroperitoneum and peritoneum: Secondary | ICD-10-CM | POA: Diagnosis not present

## 2022-11-02 DIAGNOSIS — Z86711 Personal history of pulmonary embolism: Secondary | ICD-10-CM | POA: Diagnosis not present

## 2022-11-02 DIAGNOSIS — C800 Disseminated malignant neoplasm, unspecified: Secondary | ICD-10-CM

## 2022-11-02 DIAGNOSIS — E039 Hypothyroidism, unspecified: Secondary | ICD-10-CM | POA: Diagnosis not present

## 2022-11-02 DIAGNOSIS — C168 Malignant neoplasm of overlapping sites of stomach: Secondary | ICD-10-CM

## 2022-11-02 DIAGNOSIS — R234 Changes in skin texture: Secondary | ICD-10-CM | POA: Diagnosis not present

## 2022-11-02 DIAGNOSIS — I4891 Unspecified atrial fibrillation: Secondary | ICD-10-CM | POA: Diagnosis not present

## 2022-11-02 DIAGNOSIS — L539 Erythematous condition, unspecified: Secondary | ICD-10-CM | POA: Diagnosis not present

## 2022-11-02 DIAGNOSIS — L439 Lichen planus, unspecified: Secondary | ICD-10-CM | POA: Diagnosis not present

## 2022-11-02 DIAGNOSIS — C169 Malignant neoplasm of stomach, unspecified: Secondary | ICD-10-CM | POA: Diagnosis present

## 2022-11-02 DIAGNOSIS — D6481 Anemia due to antineoplastic chemotherapy: Secondary | ICD-10-CM | POA: Diagnosis not present

## 2022-11-02 DIAGNOSIS — Z86718 Personal history of other venous thrombosis and embolism: Secondary | ICD-10-CM | POA: Insufficient documentation

## 2022-11-02 LAB — CBC WITH DIFFERENTIAL (CANCER CENTER ONLY)
Abs Immature Granulocytes: 0.04 10*3/uL (ref 0.00–0.07)
Basophils Absolute: 0.1 10*3/uL (ref 0.0–0.1)
Basophils Relative: 1 %
Eosinophils Absolute: 0.1 10*3/uL (ref 0.0–0.5)
Eosinophils Relative: 2 %
HCT: 34.3 % — ABNORMAL LOW (ref 39.0–52.0)
Hemoglobin: 11.4 g/dL — ABNORMAL LOW (ref 13.0–17.0)
Immature Granulocytes: 1 %
Lymphocytes Relative: 33 %
Lymphs Abs: 2.6 10*3/uL (ref 0.7–4.0)
MCH: 32 pg (ref 26.0–34.0)
MCHC: 33.2 g/dL (ref 30.0–36.0)
MCV: 96.3 fL (ref 80.0–100.0)
Monocytes Absolute: 0.6 10*3/uL (ref 0.1–1.0)
Monocytes Relative: 8 %
Neutro Abs: 4.3 10*3/uL (ref 1.7–7.7)
Neutrophils Relative %: 55 %
Platelet Count: 184 10*3/uL (ref 150–400)
RBC: 3.56 MIL/uL — ABNORMAL LOW (ref 4.22–5.81)
RDW: 16.5 % — ABNORMAL HIGH (ref 11.5–15.5)
WBC Count: 7.8 10*3/uL (ref 4.0–10.5)
nRBC: 0 % (ref 0.0–0.2)

## 2022-11-02 LAB — CMP (CANCER CENTER ONLY)
ALT: 8 U/L (ref 0–44)
AST: 11 U/L — ABNORMAL LOW (ref 15–41)
Albumin: 3.6 g/dL (ref 3.5–5.0)
Alkaline Phosphatase: 46 U/L (ref 38–126)
Anion gap: 8 (ref 5–15)
BUN: 17 mg/dL (ref 8–23)
CO2: 27 mmol/L (ref 22–32)
Calcium: 9.1 mg/dL (ref 8.9–10.3)
Chloride: 105 mmol/L (ref 98–111)
Creatinine: 1.05 mg/dL (ref 0.61–1.24)
GFR, Estimated: >60 mL/min (ref >60)
Glucose, Bld: 93 mg/dL (ref 70–99)
Potassium: 3.6 mmol/L (ref 3.5–5.1)
Sodium: 140 mmol/L (ref 135–145)
Total Bilirubin: 0.5 mg/dL (ref 0.3–1.2)
Total Protein: 6.2 g/dL — ABNORMAL LOW (ref 6.5–8.1)

## 2022-11-02 MED ORDER — CAPECITABINE 500 MG PO TABS
ORAL_TABLET | ORAL | 0 refills | Status: DC
Start: 2022-11-02 — End: 2022-12-11
  Filled 2022-11-02: qty 98, fill #0
  Filled 2022-11-21: qty 70, 21d supply, fill #0
  Filled 2022-11-21: qty 98, 21d supply, fill #0

## 2022-11-02 NOTE — Progress Notes (Signed)
Per Dr. Truett Perna, change Xeloda rx 1500 mg in the am and 1000 mg in the pm. For 14 days starting July 1st. Change sent to [facility].

## 2022-11-02 NOTE — Progress Notes (Signed)
[facility] OFFICE PROGRESS NOTE   Diagnosis: Gastric cancer  INTERVAL HISTORY:   Todd Harrison completed a cycle of Xeloda beginning 10/16/2022.  No mouth sores.  He reports loose stool, but no frank diarrhea.  He continues to have an intermittent rash at the right side of the abdomen/chest.  He feels the "lichen planus "has increased over the feet.  He cut his right foot on a stair while at the beach.  There are several ulcerations at the plantar surface of the right foot related to the laceration.  Objective:  Vital signs in last 24 hours:  Blood pressure 105/66, pulse 70, temperature 98.2 F (36.8 C), temperature source Oral, resp. rate 20, height 6' (1.829 m), weight 256 lb 14.4 oz (116.5 kg), SpO2 98 %.    HEENT: No thrush or ulcers Resp: Clear bilaterally Cardio: Regular rate and rhythm, 2/6 systolic murmur GI: No hepatosplenomegaly, no mass, nontender, no apparent ascites Vascular: No leg edema  Skin: Palms without erythema, dryness with white dry areas at the soles, mild erythema of the soles.  Ulceration at the plantar surface at the right second and third MTP region.  No evidence of infection, the skin of the soles is thickened  Portacath/PICC-without erythema  Lab Results:  Lab Results  Component Value Date   WBC 7.8 11/02/2022   HGB 11.4 (L) 11/02/2022   HCT 34.3 (L) 11/02/2022   MCV 96.3 11/02/2022   PLT 184 11/02/2022   NEUTROABS 4.3 11/02/2022    CMP  Lab Results  Component Value Date   NA 136 10/04/2022   K 3.7 10/04/2022   CL 104 10/04/2022   CO2 24 10/04/2022   GLUCOSE 102 (H) 10/04/2022   BUN 14 10/04/2022   CREATININE 0.93 10/04/2022   CALCIUM 8.9 10/04/2022   PROT 6.1 (L) 10/04/2022   ALBUMIN 3.6 10/04/2022   AST 14 (L) 10/04/2022   ALT 9 10/04/2022   ALKPHOS 43 10/04/2022   BILITOT 0.6 10/04/2022   GFRNONAA >60 10/04/2022   GFRAA 74 05/20/2020    Lab Results  Component Value Date   CEA1 <0.6 04/13/2022    Lab Results   Component Value Date   INR 1.3 (H) 07/24/2022   LABPROT 15.7 (H) 07/24/2022    Imaging:  No results found.  Medications: I have reviewed the patient's current medications.   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  Internal hemorrhoids.  No specimens collected. Upper endoscopy 04/25/2022-large infiltrative mass with oozing bleeding and stigmata of recent bleeding in the gastric fundus, on the anterior wall of the gastric body and on the greater curvature of the gastric body; deformity in the gastric antrum.  Extrinsic deformity in the entire duodenum.  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 Her2 by IHC negative, 1+; MMR IHC normal; PD-L1 CPS 1%, positive.  Cycle 1 FOLFOX 05/04/2022 Cycle 2 FOLFOX 05/17/2022 Cycle 3 FOLFOX 05/31/2022-oxaliplatin dose reduced secondary to prolonged cold sensitivity Cycle 4 FOLFOX 06/14/2022 Cycle 5 FOLFOX 06/28/2022 CT abdomen/pelvis 07/08/2022-asymmetric wall thickening at the lesser curvature of the stomach, decreased ascites, increased diffuse omental and peritoneal nodularity Cycle 6 FOLFOX 07/12/2022 Cycle 7 FOLFOX 08/09/2022 Cycle 8 FOLFOX 08/23/2022  Cycle 9 FOLFOX 09/06/2022 CTs 09/11/2022-stable, no new or progressive interval findings Cycle 10 FOLFOX 09/20/2022 Xeloda maintenance 2 weeks on/1 week off 10/16/2022 Cycle 2 Xeloda 11/06/2022-Xeloda dose reduced secondary to early foot skin toxicity Truncal rash-status post recent punch biopsy with pathology  pending; biopsy reported negative per family 04/26/2022 Lichen planus of the feet Hypothyroidism Atrial fibrillation Admission 07/24/2022 with acute bilateral pulmonary embolism/right heart strain Heparin anticoagulation 07/24/2022 Doppler 07/24/2022-acute DVT of the right femoral, popliteal, posterior tibial, and peroneal veins, acute left posterior tibial DVT 7.  Anemia secondary to chemotherapy, phlebotomy, and hemoptysis-improved     Disposition: [name] appears stable.  He completed 1 cycle of Xeloda.  He appears to be developing early hand/foot syndrome with dryness, erythema, and skin thickening at the soles.  He will begin a second cycle of Xeloda 11/06/2022.  The Xeloda will be dose reduced.  Mr. Betancur will return for an office and lab visit in 3 weeks.  He will call for development of hand/foot pain or increased skin changes.  Thornton Papas, MD  11/02/2022  9:27 AM

## 2022-11-13 ENCOUNTER — Other Ambulatory Visit (HOSPITAL_COMMUNITY): Payer: Self-pay

## 2022-11-14 ENCOUNTER — Encounter: Payer: Self-pay | Admitting: Oncology

## 2022-11-15 ENCOUNTER — Telehealth: Payer: Self-pay | Admitting: *Deleted

## 2022-11-15 DIAGNOSIS — C168 Malignant neoplasm of overlapping sites of stomach: Secondary | ICD-10-CM

## 2022-11-15 MED ORDER — METHYLPREDNISOLONE 4 MG PO TBPK
ORAL_TABLET | ORAL | 0 refills | Status: DC
Start: 2022-11-15 — End: 2022-12-04

## 2022-11-15 NOTE — Telephone Encounter (Signed)
Notified patient that MD ordered Medrol dosepack.

## 2022-11-15 NOTE — Telephone Encounter (Signed)
Todd Harrison reports his rash has returned on his torso and back area and itches. Sometime itches so bad it hurts. Has been taking Tylenol, Claritin, Triamcinolone cream and took his last prednisone pill he had left over from previous outbreak. Denies any sun exposure, environmental exposure or new detergent or soap. Asking for another course of prednisone, which has always helped in the past.  Has seen dermatologist in past for this issue and could never determine cause of these intermittent rashes.

## 2022-11-21 ENCOUNTER — Other Ambulatory Visit (HOSPITAL_COMMUNITY): Payer: Self-pay

## 2022-11-21 ENCOUNTER — Other Ambulatory Visit: Payer: Self-pay

## 2022-11-22 ENCOUNTER — Ambulatory Visit: Payer: Medicare HMO | Admitting: Podiatry

## 2022-11-27 ENCOUNTER — Other Ambulatory Visit: Payer: Self-pay | Admitting: *Deleted

## 2022-11-27 ENCOUNTER — Other Ambulatory Visit (HOSPITAL_BASED_OUTPATIENT_CLINIC_OR_DEPARTMENT_OTHER): Payer: Self-pay

## 2022-11-27 ENCOUNTER — Encounter: Payer: Self-pay | Admitting: Oncology

## 2022-11-27 ENCOUNTER — Other Ambulatory Visit: Payer: Self-pay | Admitting: Oncology

## 2022-11-27 MED ORDER — NYSTATIN 100000 UNIT/ML MT SUSP
5.0000 mL | Freq: Four times a day (QID) | OROMUCOSAL | 1 refills | Status: DC | PRN
  Filled 2022-11-27 – 2022-11-28 (×2): qty 240, 12d supply, fill #0

## 2022-11-27 MED ORDER — MAGIC MOUTHWASH: 5.0000 mL | Freq: Four times a day (QID) | ORAL | 1 refills | Status: DC | PRN

## 2022-11-27 NOTE — Progress Notes (Signed)
Called in [facility] for report of sore gums and white spot on gums. Patient made aware where to p/u script.

## 2022-11-28 ENCOUNTER — Other Ambulatory Visit (HOSPITAL_BASED_OUTPATIENT_CLINIC_OR_DEPARTMENT_OTHER): Payer: Self-pay

## 2022-11-28 ENCOUNTER — Other Ambulatory Visit (HOSPITAL_COMMUNITY): Payer: Self-pay

## 2022-12-02 ENCOUNTER — Other Ambulatory Visit: Payer: Self-pay | Admitting: Oncology

## 2022-12-02 DIAGNOSIS — C168 Malignant neoplasm of overlapping sites of stomach: Secondary | ICD-10-CM

## 2022-12-04 ENCOUNTER — Other Ambulatory Visit: Payer: Self-pay | Admitting: Oncology

## 2022-12-04 ENCOUNTER — Inpatient Hospital Stay: Payer: Medicare HMO | Admitting: Oncology

## 2022-12-04 ENCOUNTER — Other Ambulatory Visit (HOSPITAL_COMMUNITY): Payer: Self-pay

## 2022-12-04 ENCOUNTER — Inpatient Hospital Stay: Payer: Medicare HMO

## 2022-12-04 ENCOUNTER — Encounter: Payer: Self-pay | Admitting: Oncology

## 2022-12-04 ENCOUNTER — Inpatient Hospital Stay: Payer: Medicare HMO | Attending: Nurse Practitioner

## 2022-12-04 ENCOUNTER — Other Ambulatory Visit: Payer: Self-pay | Admitting: *Deleted

## 2022-12-04 VITALS — BP 120/66 | HR 64 | Temp 98.1°F | Resp 18 | Ht 72.0 in | Wt 262.0 lb

## 2022-12-04 DIAGNOSIS — L439 Lichen planus, unspecified: Secondary | ICD-10-CM | POA: Diagnosis not present

## 2022-12-04 DIAGNOSIS — D6481 Anemia due to antineoplastic chemotherapy: Secondary | ICD-10-CM | POA: Insufficient documentation

## 2022-12-04 DIAGNOSIS — C800 Disseminated malignant neoplasm, unspecified: Secondary | ICD-10-CM

## 2022-12-04 DIAGNOSIS — Z86711 Personal history of pulmonary embolism: Secondary | ICD-10-CM | POA: Diagnosis not present

## 2022-12-04 DIAGNOSIS — Z86718 Personal history of other venous thrombosis and embolism: Secondary | ICD-10-CM | POA: Insufficient documentation

## 2022-12-04 DIAGNOSIS — E039 Hypothyroidism, unspecified: Secondary | ICD-10-CM | POA: Diagnosis not present

## 2022-12-04 DIAGNOSIS — R300 Dysuria: Secondary | ICD-10-CM

## 2022-12-04 DIAGNOSIS — C169 Malignant neoplasm of stomach, unspecified: Secondary | ICD-10-CM | POA: Insufficient documentation

## 2022-12-04 DIAGNOSIS — Z95828 Presence of other vascular implants and grafts: Secondary | ICD-10-CM

## 2022-12-04 DIAGNOSIS — R1031 Right lower quadrant pain: Secondary | ICD-10-CM | POA: Insufficient documentation

## 2022-12-04 DIAGNOSIS — C168 Malignant neoplasm of overlapping sites of stomach: Secondary | ICD-10-CM | POA: Diagnosis not present

## 2022-12-04 DIAGNOSIS — I4891 Unspecified atrial fibrillation: Secondary | ICD-10-CM | POA: Diagnosis not present

## 2022-12-04 LAB — CMP (CANCER CENTER ONLY)
ALT: 10 U/L (ref 0–44)
AST: 12 U/L — ABNORMAL LOW (ref 15–41)
Albumin: 3.8 g/dL (ref 3.5–5.0)
Alkaline Phosphatase: 44 U/L (ref 38–126)
Anion gap: 7 (ref 5–15)
BUN: 15 mg/dL (ref 8–23)
CO2: 26 mmol/L (ref 22–32)
Calcium: 9.1 mg/dL (ref 8.9–10.3)
Chloride: 104 mmol/L (ref 98–111)
Creatinine: 0.99 mg/dL (ref 0.61–1.24)
GFR, Estimated: >60 mL/min (ref >60)
Glucose, Bld: 97 mg/dL (ref 70–99)
Potassium: 4 mmol/L (ref 3.5–5.1)
Sodium: 137 mmol/L (ref 135–145)
Total Bilirubin: 0.6 mg/dL (ref 0.3–1.2)
Total Protein: 6.2 g/dL — ABNORMAL LOW (ref 6.5–8.1)

## 2022-12-04 LAB — URINALYSIS, COMPLETE (UACMP) WITH MICROSCOPIC
Bacteria, UA: NONE SEEN
Bilirubin Urine: NEGATIVE
Glucose, UA: NEGATIVE mg/dL
Hgb urine dipstick: NEGATIVE
Ketones, ur: NEGATIVE mg/dL
Leukocytes,Ua: NEGATIVE
Nitrite: NEGATIVE
Specific Gravity, Urine: 1.024 (ref 1.005–1.030)
pH: 6.5 (ref 5.0–8.0)

## 2022-12-04 LAB — CBC WITH DIFFERENTIAL (CANCER CENTER ONLY)
Abs Immature Granulocytes: 0.03 10*3/uL (ref 0.00–0.07)
Basophils Absolute: 0.1 10*3/uL (ref 0.0–0.1)
Basophils Relative: 1 %
Eosinophils Absolute: 0.2 10*3/uL (ref 0.0–0.5)
Eosinophils Relative: 3 %
HCT: 34.2 % — ABNORMAL LOW (ref 39.0–52.0)
Hemoglobin: 11.8 g/dL — ABNORMAL LOW (ref 13.0–17.0)
Immature Granulocytes: 0 %
Lymphocytes Relative: 24 %
Lymphs Abs: 1.7 10*3/uL (ref 0.7–4.0)
MCH: 33.3 pg (ref 26.0–34.0)
MCHC: 34.5 g/dL (ref 30.0–36.0)
MCV: 96.6 fL (ref 80.0–100.0)
Monocytes Absolute: 0.8 10*3/uL (ref 0.1–1.0)
Monocytes Relative: 10 %
Neutro Abs: 4.6 10*3/uL (ref 1.7–7.7)
Neutrophils Relative %: 62 %
Platelet Count: 156 10*3/uL (ref 150–400)
RBC: 3.54 MIL/uL — ABNORMAL LOW (ref 4.22–5.81)
RDW: 16.4 % — ABNORMAL HIGH (ref 11.5–15.5)
WBC Count: 7.4 10*3/uL (ref 4.0–10.5)
nRBC: 0 % (ref 0.0–0.2)

## 2022-12-04 MED ORDER — SODIUM CHLORIDE 0.9% FLUSH
10.0000 mL | INTRAVENOUS | Status: DC | PRN
  Administered 2022-12-04: 10 mL via INTRAVENOUS

## 2022-12-04 MED ORDER — TRAMADOL HCL 50 MG PO TABS: 50.0000 mg | ORAL_TABLET | Freq: Four times a day (QID) | ORAL | 0 refills | Status: DC | PRN

## 2022-12-04 MED ORDER — HEPARIN SOD (PORK) LOCK FLUSH 100 UNIT/ML IV SOLN
500.0000 [IU] | Freq: Once | INTRAVENOUS | Status: AC
  Administered 2022-12-04: 500 [IU] via INTRAVENOUS

## 2022-12-04 NOTE — Progress Notes (Signed)
[facility] OFF[facility] PROGRESS [facility]   Diagnosis: Gastric cancer  INTERVAL HISTORY:   Todd. Apostle returns as scheduled.  He began another cycle of Xeloda on 11/27/2022.  No diarrhea.  Mild mouth soreness.  No nausea.  No abdominal distention.  No hand or foot pain.  He reports pain at the right lower back for the past week.  Objective:  Vital signs in last 24 hours:  Blood pressure 120/66, pulse 64, temperature 98.1 F (36.7 C), temperature source Oral, resp. rate 18, height 6' (1.829 m), weight 262 lb (118.8 kg), SpO2 98%.    HEENT: No thrush or ulcers Resp: Lungs clear bilaterally Cardio: Regular rate and rhythm GI: Nontender, no mass, no apparent ascites Vascular: The right lower leg is slightly larger than left side, no edem  Skin: Palms without erythema, dryness at the soles Musculoskeletal: No tenderness at the right flank  Portacath/PICC-without erythema  Lab Results:  Lab Results  Component Value Date   WBC 7.4 12/04/2022   HGB 11.8 (L) 12/04/2022   HCT 34.2 (L) 12/04/2022   MCV 96.6 12/04/2022   PLT 156 12/04/2022   NEUTROABS 4.6 12/04/2022    CMP  Lab Results  Component Value Date   NA 137 12/04/2022   K 4.0 12/04/2022   CL 104 12/04/2022   CO2 26 12/04/2022   GLUCOSE 97 12/04/2022   BUN 15 12/04/2022   CREATININE 0.99 12/04/2022   CALCIUM 9.1 12/04/2022   PROT 6.2 (L) 12/04/2022   ALBUMIN 3.8 12/04/2022   AST 12 (L) 12/04/2022   ALT 10 12/04/2022   ALKPHOS 44 12/04/2022   BILITOT 0.6 12/04/2022   GFRNONAA >60 12/04/2022   GFRAA 74 05/20/2020    Lab Results  Component Value Date   CEA1 <0.6 04/13/2022     Medications: I have reviewed the patient's current medications.   Assessment/Plan: Gastric cancer with CT evidence of abdominal carcinomatosis CT abdomen/pelvis 04/13/2022-ascites, diffuse omental and peritoneal nodularity, possible circumferential mass in the transverse colon, new small left greater than right pleural  effusions CT abdominal mass biopsy 04/24/2022-metastatic poorly differentiated adenocarcinoma with very focal signet ring cell features CT paracentesis 04/24/2022-no malignant cells identified Colonoscopy 04/25/2022-diverticulosis in the sigmoid colon.  Internal hemorrhoids.  No specimens collected. Upper endoscopy 04/25/2022-large infiltrative mass with oozing bleeding and stigmata of recent bleeding in the gastric fundus, on the anterior wall of the gastric body and on the greater curvature of the gastric body; deformity in the gastric antrum.  Extrinsic deformity in the entire duodenum.  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 Her2 by IHC negative, 1+; MMR IHC normal; PD-L1 CPS 1%, positive.  Cycle 1 FOLFOX 05/04/2022 Cycle 2 FOLFOX 05/17/2022 Cycle 3 FOLFOX 05/31/2022-oxaliplatin dose reduced secondary to prolonged cold sensitivity Cycle 4 FOLFOX 06/14/2022 Cycle 5 FOLFOX 06/28/2022 CT abdomen/pelvis 07/08/2022-asymmetric wall thickening at the lesser curvature of the stomach, decreased ascites, increased diffuse omental and peritoneal nodularity Cycle 6 FOLFOX 07/12/2022 Cycle 7 FOLFOX 08/09/2022 Cycle 8 FOLFOX 08/23/2022 Cycle 9 FOLFOX 09/06/2022 CTs 09/11/2022-stable, no new or progressive interval findings Cycle 10 FOLFOX 09/20/2022 Xeloda maintenance 2 weeks on/1 week off 10/16/2022 Cycle 2 Xeloda 11/06/2022-Xeloda dose reduced secondary to early foot skin toxicity Cycle 3 Xeloda 11/27/2022 Truncal rash-status post recent punch biopsy with pathology pending; biopsy reported negative per family 04/26/2022 Lichen planus of the feet Hypothyroidism Atrial fibrillation Admission 07/24/2022 with acute bilateral pulmonary embolism/right heart strain Heparin anticoagulation 07/24/2022 Doppler 07/24/2022-acute DVT of the right femoral, popliteal, posterior  tibial, and peroneal veins, acute left posterior tibial DVT 7.  Anemia secondary to chemotherapy, phlebotomy,  and hemoptysis-improved   Disposition: Todd Harrison appears unchanged.  There is no clinical evidence of disease progression.  He will pleat the current cycle of Xeloda and return for an office visit on 12/18/2022.  We will plan for a restaging CT evaluation in late August.  Todd. Stoltzfoos has discomfort in the right flank region.  I suspect the pain is related to a benign musculoskeletal condition.  We will check a urinalysis today.  He will call for increased pain.  Thornton Papas, MD  12/04/2022  12:11 PM

## 2022-12-05 ENCOUNTER — Telehealth: Payer: Self-pay

## 2022-12-05 NOTE — Telephone Encounter (Signed)
Patient gave verbal understanding and had no further questions or concerns  

## 2022-12-05 NOTE — Telephone Encounter (Signed)
-----   Message from Thornton Papas sent at 12/05/2022  7:25 AM EDT ----- Please call patient, urinalysis does not suggest infection, follow-up as scheduled

## 2022-12-06 ENCOUNTER — Other Ambulatory Visit (HOSPITAL_COMMUNITY): Payer: Self-pay

## 2022-12-08 ENCOUNTER — Other Ambulatory Visit: Payer: Self-pay | Admitting: Oncology

## 2022-12-08 ENCOUNTER — Other Ambulatory Visit (HOSPITAL_COMMUNITY): Payer: Self-pay

## 2022-12-11 ENCOUNTER — Encounter: Payer: Self-pay | Admitting: Oncology

## 2022-12-11 ENCOUNTER — Other Ambulatory Visit (HOSPITAL_COMMUNITY): Payer: Self-pay

## 2022-12-11 ENCOUNTER — Other Ambulatory Visit: Payer: Self-pay | Admitting: Oncology

## 2022-12-11 ENCOUNTER — Other Ambulatory Visit: Payer: Self-pay

## 2022-12-11 DIAGNOSIS — C168 Malignant neoplasm of overlapping sites of stomach: Secondary | ICD-10-CM

## 2022-12-11 MED ORDER — CAPECITABINE 500 MG PO TABS
ORAL_TABLET | ORAL | 0 refills | Status: DC
  Filled 2022-12-11: qty 98, 21d supply, fill #0
  Filled 2022-12-12: qty 70, 21d supply, fill #0

## 2022-12-11 NOTE — Telephone Encounter (Signed)
Already sent in-duplicate

## 2022-12-12 ENCOUNTER — Other Ambulatory Visit: Payer: Self-pay

## 2022-12-13 ENCOUNTER — Other Ambulatory Visit (HOSPITAL_COMMUNITY): Payer: Self-pay

## 2022-12-13 ENCOUNTER — Ambulatory Visit (INDEPENDENT_AMBULATORY_CARE_PROVIDER_SITE_OTHER): Payer: Medicare HMO | Admitting: Family Medicine

## 2022-12-13 VITALS — BP 110/74 | Ht 72.0 in | Wt 257.0 lb

## 2022-12-13 DIAGNOSIS — S39012A Strain of muscle, fascia and tendon of lower back, initial encounter: Secondary | ICD-10-CM | POA: Diagnosis not present

## 2022-12-13 MED ORDER — TIZANIDINE HCL 4 MG PO TABS
4.0000 mg | ORAL_TABLET | Freq: Three times a day (TID) | ORAL | 0 refills | Status: DC | PRN
  Filled 2022-12-13: qty 30, 10d supply, fill #0

## 2022-12-13 NOTE — Progress Notes (Signed)
PCP: Burton Apley, MD  SUBJECTIVE:   HPI:  Patient is a 77 y.o. male here with chief complaint of low back pain.  He reports that 3 weeks ago he was twisting a bird feeder and felt a strain on his right low back/flank area.  He rested for a few days with resolution, then upon starting a walking exercise noted flare of the pain though primarily was located in bilateral low back.  Now ongoing for the past 2 weeks.  He has tried Tylenol and tramadol without improvement.  Pain is improved with both flexion and extension of his low back as well as massage.  He denies radiation down the lower extremities or incontinence of either urine or stool.  No weakness of the lower extremities acutely though he does endorse that he is rather physically deconditioned over the past 3 years after multiple medical problems including lichen planus of bilateral feet, new diagnosis of stomach cancer, and atrial fibrillation.  ROS:     See HPI  PERTINENT  PMH / PSH FH / / SH:  Past Medical, Surgical, Social, and Family History Reviewed & Updated in the EMR.  Pertinent findings include:  Asthma Atrial Fibrillation Gastric Cancer and GERD Lichen Planus of B/L Feet  Current Outpatient Medications on File Prior to Visit  Medication Sig Dispense Refill   albuterol (VENTOLIN HFA) 108 (90 Base) MCG/ACT inhaler Inhale 2 puffs into the lungs every 6 (six) hours as needed for wheezing or shortness of breath. (Patient not taking: Reported on 12/04/2022) 8 g 1   capecitabine (XELODA) 500 MG tablet Take 3 tablets (1500 mg) by mouth in AM and 2 tablets (1000 mg) in PM. Take with food. Take for 14 days, then hold for 7 days. Repeat every 21 days. Start cycle on 12/18/22 98 tablet 0   enoxaparin (LOVENOX) 120 MG/0.8ML injection INJECT 0.8 MLS (120 MG TOTAL) INTO THE SKIN EVERY 12 (TWELVE) HOURS. 48 mL 0   fluticasone (FLONASE) 50 MCG/ACT nasal spray Place 1 spray into both nostrils daily as needed for allergies or rhinitis. (Patient  not taking: Reported on 12/04/2022)     fluticasone furoate-vilanterol (BREO ELLIPTA) 100-25 MCG/ACT AEPB Inhale 1 puff into the lungs daily. (Patient not taking: Reported on 12/04/2022) 60 each 6   hydroxychloroquine (PLAQUENIL) 200 MG tablet Take 200 mg by mouth 2 (two) times daily.     ISOtretinoin (ACCUTANE) 40 MG capsule Take 40 mg by mouth 2 (two) times daily. (Patient not taking: Reported on 12/04/2022)     KLOR-CON M20 20 MEQ tablet TAKE 1 TABLET BY MOUTH EVERY DAY 90 tablet 1   levothyroxine (SYNTHROID) 112 MCG tablet Take 112 mcg by mouth daily before breakfast.     lidocaine-prilocaine (EMLA) cream Apply 1 Application topically as needed (Apply 1 hour before coming to [facility] for accessing). 30 g 2   magic mouthwash (nystatin, diphenhydrAMINE, alum & mag hydroxide) suspension mixture Swish and spit 5 mLs 4 (four) times daily as needed for mouth pain 240 mL 1   metoprolol succinate (TOPROL-XL) 50 MG 24 hr tablet TAKE 1 TABLET BY MOUTH EVERY DAY 90 tablet 3   omeprazole (PRILOSEC) 40 MG capsule Take 40 mg by mouth daily.     ondansetron (ZOFRAN) 8 MG tablet Take 1 tablet (8 mg total) by mouth every 8 (eight) hours as needed for nausea or vomiting. Do not take within 72 hours of day 1 chemo 30 tablet 1   polyethylene glycol (MIRALAX / GLYCOLAX) 17 g  packet Take 17 g by mouth daily.     prochlorperazine (COMPAZINE) 10 MG tablet Take 1 tablet (10 mg total) by mouth every 6 (six) hours as needed for nausea or vomiting. (Patient not taking: Reported on 12/04/2022) 30 tablet 1   traMADol (ULTRAM) 50 MG tablet TAKE 1 TABLET BY MOUTH EVERY 6 HOURS AS NEEDED. 60 tablet 0   triamcinolone (KENALOG) 0.1 % Apply 1 application  topically daily as needed (lichen planus flare).     No current facility-administered medications on file prior to visit.   Past Surgical History:  Procedure Laterality Date   CARDIOVERSION N/A 02/23/2020   Procedure: CARDIOVERSION;  Surgeon: Christell Constant, MD;   Location: [facility] ENDOSCOPY;  Service: Cardiovascular;  Laterality: N/A;   CARDIOVERSION N/A 04/12/2020   Procedure: CARDIOVERSION;  Surgeon: Jodelle Red, MD;  Location: [facility] ENDOSCOPY;  Service: Cardiovascular;  Laterality: N/A;   CATARACT EXTRACTION Left    2019   IR IMAGING GUIDED PORT INSERTION  05/03/2022   TONSILLECTOMY     Allergies  Allergen Reactions   Gold Itching   Mixed Grasses     unknown   OBJECTIVE:  BP 110/74   Ht 6' (1.829 m)   Wt 257 lb (116.6 kg)   BMI 34.86 kg/m   PHYSICAL EXAM:  GEN: Obese gentleman who is alert and oriented, NAD, comfortable in exam room RESP: Unlabored respirations, symmetric chest rise PSY: normal mood, congruent affect   MSK EXAM of Lumbar Spine: No gross deformity, scoliosis, or ecchymosis. No midline or bony TTP. No reproducible TTP along lumbar paraspinals.  FROM in flexion, extension, and side bending. Strength LEs 5/5 all muscle groups.   2+ MSRs in patellar and achilles tendons, equal bilaterally. Negative SLR and seated slump test. Sensation intact to light touch bilaterally.   ASSESSMENT & PLAN:  1. Strain of lumbar region, initial encounter History and physical exam most consistent with lumbar strain and muscle spasms.  No red flag symptoms or indications for imaging at this time.  We reviewed conservative management including continuation of Tylenol and a referral to physical therapy for lumbar stretching and strengthening exercises.  We also discussed trial of muscle relaxant and reviewed taking at night given slight increase in fall risk.  Prescription for tizanidine was sent.  Follow-up in 4 to 6 weeks. - Ambulatory referral to Physical Therapy   Glean Salen, MD PGY-4, Sports Medicine Fellow [facility]

## 2022-12-13 NOTE — Patient Instructions (Addendum)
You have a lumbar strain. Ok to take tylenol for baseline pain relief (1-2 extra strength tabs 3x/day) Tizanidine as needed for muscle spasms (no driving on this medicine if it makes you sleepy). Stay as active as possible. Do home exercises and stretches as directed - hold each for 20-30 seconds and do each one three times. Start physical therapy and do home exercises on days you don't go to therapy. Strengthening of low back muscles, abdominal musculature are key for long term pain relief. If not improving, will consider further imaging (MRI). Follow up with me in 4 weeks but call me sooner if you're struggling.  [facility] Located in: [facility] Address: [address], [place], [place] [address] Phone: [phone]

## 2022-12-15 ENCOUNTER — Encounter: Payer: Self-pay | Admitting: Family Medicine

## 2022-12-18 ENCOUNTER — Inpatient Hospital Stay: Payer: Medicare HMO | Attending: Nurse Practitioner

## 2022-12-18 ENCOUNTER — Inpatient Hospital Stay: Payer: Medicare HMO | Admitting: Oncology

## 2022-12-18 ENCOUNTER — Inpatient Hospital Stay: Payer: Medicare HMO

## 2022-12-18 ENCOUNTER — Encounter: Payer: Self-pay | Admitting: Oncology

## 2022-12-18 ENCOUNTER — Ambulatory Visit (HOSPITAL_BASED_OUTPATIENT_CLINIC_OR_DEPARTMENT_OTHER)
Admission: RE | Admit: 2022-12-18 | Discharge: 2022-12-18 | Disposition: A | Discharge status: Home / Self Care | Payer: Medicare HMO | Source: Ambulatory Visit | Attending: Oncology | Admitting: Oncology

## 2022-12-18 VITALS — BP 107/56 | HR 69 | Temp 98.2°F | Resp 20 | Ht 72.0 in | Wt 265.0 lb

## 2022-12-18 DIAGNOSIS — Z86718 Personal history of other venous thrombosis and embolism: Secondary | ICD-10-CM | POA: Diagnosis not present

## 2022-12-18 DIAGNOSIS — E039 Hypothyroidism, unspecified: Secondary | ICD-10-CM | POA: Insufficient documentation

## 2022-12-18 DIAGNOSIS — M545 Low back pain, unspecified: Secondary | ICD-10-CM | POA: Diagnosis not present

## 2022-12-18 DIAGNOSIS — C168 Malignant neoplasm of overlapping sites of stomach: Secondary | ICD-10-CM | POA: Insufficient documentation

## 2022-12-18 DIAGNOSIS — D6481 Anemia due to antineoplastic chemotherapy: Secondary | ICD-10-CM | POA: Diagnosis not present

## 2022-12-18 DIAGNOSIS — I4891 Unspecified atrial fibrillation: Secondary | ICD-10-CM | POA: Insufficient documentation

## 2022-12-18 DIAGNOSIS — C786 Secondary malignant neoplasm of retroperitoneum and peritoneum: Secondary | ICD-10-CM | POA: Diagnosis not present

## 2022-12-18 DIAGNOSIS — L439 Lichen planus, unspecified: Secondary | ICD-10-CM | POA: Diagnosis not present

## 2022-12-18 DIAGNOSIS — R21 Rash and other nonspecific skin eruption: Secondary | ICD-10-CM | POA: Diagnosis not present

## 2022-12-18 DIAGNOSIS — C169 Malignant neoplasm of stomach, unspecified: Secondary | ICD-10-CM | POA: Insufficient documentation

## 2022-12-18 DIAGNOSIS — Z95828 Presence of other vascular implants and grafts: Secondary | ICD-10-CM

## 2022-12-18 DIAGNOSIS — M4856XA Collapsed vertebra, not elsewhere classified, lumbar region, initial encounter for fracture: Secondary | ICD-10-CM | POA: Insufficient documentation

## 2022-12-18 DIAGNOSIS — Z86711 Personal history of pulmonary embolism: Secondary | ICD-10-CM | POA: Diagnosis not present

## 2022-12-18 LAB — CBC WITH DIFFERENTIAL (CANCER CENTER ONLY)
Abs Immature Granulocytes: 0.02 10*3/uL (ref 0.00–0.07)
Basophils Absolute: 0.1 10*3/uL (ref 0.0–0.1)
Basophils Relative: 1 %
Eosinophils Absolute: 0.3 10*3/uL (ref 0.0–0.5)
Eosinophils Relative: 4 %
HCT: 33.3 % — ABNORMAL LOW (ref 39.0–52.0)
Hemoglobin: 11.4 g/dL — ABNORMAL LOW (ref 13.0–17.0)
Immature Granulocytes: 0 %
Lymphocytes Relative: 18 %
Lymphs Abs: 1.2 10*3/uL (ref 0.7–4.0)
MCH: 33.6 pg (ref 26.0–34.0)
MCHC: 34.2 g/dL (ref 30.0–36.0)
MCV: 98.2 fL (ref 80.0–100.0)
Monocytes Absolute: 0.7 10*3/uL (ref 0.1–1.0)
Monocytes Relative: 9 %
Neutro Abs: 4.8 10*3/uL (ref 1.7–7.7)
Neutrophils Relative %: 68 %
Platelet Count: 185 10*3/uL (ref 150–400)
RBC: 3.39 MIL/uL — ABNORMAL LOW (ref 4.22–5.81)
RDW: 17.7 % — ABNORMAL HIGH (ref 11.5–15.5)
WBC Count: 7 10*3/uL (ref 4.0–10.5)
nRBC: 0 % (ref 0.0–0.2)

## 2022-12-18 LAB — CMP (CANCER CENTER ONLY)
ALT: 9 U/L (ref 0–44)
AST: 12 U/L — ABNORMAL LOW (ref 15–41)
Albumin: 3.7 g/dL (ref 3.5–5.0)
Alkaline Phosphatase: 57 U/L (ref 38–126)
Anion gap: 9 (ref 5–15)
BUN: 15 mg/dL (ref 8–23)
CO2: 25 mmol/L (ref 22–32)
Calcium: 9.2 mg/dL (ref 8.9–10.3)
Chloride: 102 mmol/L (ref 98–111)
Creatinine: 1.09 mg/dL (ref 0.61–1.24)
GFR, Estimated: >60 mL/min (ref >60)
Glucose, Bld: 99 mg/dL (ref 70–99)
Potassium: 4.2 mmol/L (ref 3.5–5.1)
Sodium: 136 mmol/L (ref 135–145)
Total Bilirubin: 0.6 mg/dL (ref 0.3–1.2)
Total Protein: 6.4 g/dL — ABNORMAL LOW (ref 6.5–8.1)

## 2022-12-18 MED ORDER — OXYCODONE-ACETAMINOPHEN 5-325 MG PO TABS
1.0000 | ORAL_TABLET | Freq: Four times a day (QID) | ORAL | 0 refills | Status: DC | PRN
Start: 2022-12-18 — End: 2023-01-05

## 2022-12-18 MED ORDER — HEPARIN SOD (PORK) LOCK FLUSH 100 UNIT/ML IV SOLN
500.0000 [IU] | Freq: Once | INTRAVENOUS | Status: AC
  Administered 2022-12-18: 500 [IU] via INTRAVENOUS

## 2022-12-18 MED ORDER — SODIUM CHLORIDE 0.9% FLUSH
10.0000 mL | INTRAVENOUS | Status: DC | PRN
  Administered 2022-12-18: 10 mL via INTRAVENOUS

## 2022-12-18 NOTE — Patient Instructions (Signed)
 [employer], Adult A central line is a long, thin tube (catheter) that can be used to collect blood for testing or to give medicine through a vein. The tip of the central line ends in a large vein just above the heart (vena cava). A central line may be placed because: You need to get medicines or fluids through an IV for a long period of time. You need nutrition but cannot eat or absorb nutrients. The veins in your hands or arms are difficult to use for IV access. You need a blood transfusion. You need chemotherapy or dialysis. Types of central lines There are four main types of central lines: Peripherally inserted central catheter (PICC) line. This type is used for access of one week or longer. It can be used to draw blood and give fluids or medicines. A PICC looks like an IV tube, but it goes up the arm to the heart. It is usually inserted in the upper arm and taped in place on the arm. Tunneled central line. This type is used for long-term therapy and dialysis. It is placed in a large vein in the neck, chest, or groin. It is inserted through a small incision made over the vein, and then it is advanced to the heart. It is tunneled under the skin and brought out through a second incision. Non-tunneled central line. This type is used for short-term access, usually for a maximum of 7 days. It is often used in the emergency department. It is inserted in the neck, chest, or groin. Implanted port. This type is used for long-term therapy. It can stay in place longer than other types of central lines. It is normally inserted in the upper chest, but it can also be placed in the upper arm or the abdomen. It is inserted and removed with surgery, and it is accessed using a needle. The type of central line that you receive depends on how long you will need it, your medical condition, and the condition of your veins. Tell a health care provider about: Any allergies you have. All medicines you are taking,  including vitamins, herbs, eye drops, creams, and over-the-counter medicines. Any problems you or family members have had with anesthetic medicines. Any blood disorders you have. Any surgeries you have had. Any medical conditions you have. Whether you are pregnant or may be pregnant. What are the risks? Generally, placement and use of a central line is safe. However, problems may occur, including: Infection. A blood clot that blocks the central line or forms in the vein and travels to the heart. Bleeding from the place where the central line was inserted. Developing a hole or crack within the central line. If this happens, the central line will need to be replaced. Central line failure. The catheter moving or coming out of place. What happens before the procedure? Medicines Ask your health care provider about: Changing or stopping your regular medicines. This is especially important if you are taking diabetes medicines or blood thinners. Taking medicines such as aspirin and ibuprofen. These medicines can thin your blood. Do not take these medicines unless your health care provider tells you to take them. Taking over-the-counter medicines, vitamins, herbs, and supplements. General instructions Follow instructions from your health care provider about eating or drinking restrictions. Ask your health care provider: How your procedure site will be marked. What steps will be taken to help prevent infection. These steps may include: Removing hair at the procedure site. Washing skin with a germ-killing soap.  Plan to have a responsible adult take you home from the hospital or clinic. If you will be going home right after the procedure, plan to have a responsible adult care for you for the time you are told. This is important. What happens during the procedure? The procedure will vary depending on the type of central line being placed. In general: An IV will be inserted into one of your  veins. You will be given one or more of the following: A medicine to help you relax (sedative). A medicine to numb the area (local anesthetic). Your skin will be cleaned with a germ-killing (antiseptic) solution, and you may be covered with sterile drapes. Your blood pressure, heart rate, breathing rate, and blood oxygen level will be monitored during the procedure. The central line catheter will be inserted into the vein and advanced to the correct spot. The health care provider may use X-ray equipment to help guide the catheter to the right place. A bandage (dressing) will be placed over the insertion area. The procedure may vary among health care providers and hospitals. What can I expect after the procedure? Your blood pressure, heart rate, breathing rate, and blood oxygen level will be monitored until you leave the hospital or clinic. Antiseptic caps may be placed on the ends of the central line tubing. If you were given a sedative during the procedure, it can affect you for several hours. Do not drive or operate machinery until your health care provider says that it is safe. Follow these instructions at home: Flushing and cleaning the central line  Follow instructions from your health care provider about flushing and cleaning the central line and the area around it. Only use sterile supplies to flush the central line. Use supplies from your health care provider, a pharmacy, or another source that is recommended by your health care provider. Before you flush the central line or clean the central line or the area around it: Wash your hands with soap and water for at least 20 seconds. If soap and water are not available, use alcohol-based hand sanitizer. Clean the central line hub with rubbing alcohol. Unless directed otherwise by the manufacturer's instructions, scrub using a twisting motion and rub for 10 to 15 seconds or for 30 twists. Be sure you scrub the top of the hub, not just the  sides. Never reuse alcohol pads. Let the hub dry before use. Prevent it from touching anything while drying. Caring for the incision or central line site Check your incision or central line site every day for signs of infection. Check for: Redness, swelling, or pain. Fluid or blood. Warmth. Pus or a bad smell. Keep the insertion site of your central line clean and dry at all times. Change your dressing only as told by your health care provider. Keep your dressing dry. If it gets wet, have it changed as soon as possible. General instructions Follow instructions from your health care provider for the type of device that you have. Keep the tube clamped, unless it is being used. If the central line accidentally gets pulled on, make sure: The dressing is okay. There is no bleeding. The line has not been pulled out. Do not use scissors or sharp objects near the tube. Do not take baths, swim, or use a hot tub until your health care provider approves. Ask your health care provider if you may take showers. You may only be allowed to take sponge baths. Ask your health care provider what activities are  safe for you. You may be restricted from lifting or making repetitive arm movements on the side of your central line. Take over-the-counter and prescription medicines only as told by your health care provider. Keep all follow-up visits. This is important. Storage and disposal of supplies Keep your supplies in a clean, dry location. Throw away any used syringes in a disposal container that is meant for sharp items (sharps container). You can buy a sharps container from a pharmacy, or you can make one by using an empty hard plastic bottle with a cover. Place any used dressings or infusion bags into a plastic bag. Throw that bag in the trash. Contact a health care provider if: You have redness, swelling, or pain around your insertion site. You have fluid or blood coming from your insertion site. Your  insertion site feels warm to the touch. You have pus or a bad smell coming from your insertion site. Get help right away if: You have: A fever or chills. Shortness of breath. Chest pain or a racing heartbeat. Swelling in your neck, face, chest, or arm on the side of your central line. You feel dizzy or you faint. Your incision or central line site has red streaks spreading away from the area. Your incision or central line site is bleeding and does not stop. Your central line is difficult to flush or will not flush. You do not get a blood return from the central line. Your central line gets loose or damaged or comes out. Your catheter leaks when flushed or when fluids are infused into it. Summary A central line is a long, thin tube (catheter) that can be used to give medicine through a vein. Follow specific instructions from your health care provider for the type of device that you have. Keep the insertion site of your central line clean and dry at all times. Keep the tube clamped, unless it is being used. This information is not intended to replace advice given to you by your health care provider. Make sure you discuss any questions you have with your health care provider. Document Revised: 12/24/2019 Document Reviewed: 12/25/2019 Elsevier Patient Education  2024 [employer].

## 2022-12-18 NOTE — Progress Notes (Signed)
[facility] OFFICE PROGRESS NOTE   Diagnosis: Gastric cancer  INTERVAL HISTORY:   Todd Harrison returns for a scheduled visit.  He began another cycle of Xeloda on 11/27/2022.  No mouth sores, nausea, or diarrhea.  No hand or foot pain.  The skin rash remains improved. He had developed right flank pain when he was here on 12/04/2022.  A urinalysis was unremarkable.  He reports the pain has progressed.  He now has pain in the central and left low back.  No weakness or difficulty with bowel/bladder control.  Tramadol has not helped the pain.  He was evaluated by sports medicine and felt to have a lumbar pain and muscle spasms.  He was given a muscle relaxant referred to physical therapy.  The muscle relaxant did not help.  He is scheduled to see physical therapy again later this week.  The pain is constant and worse when changing position or lying down.  Objective:  Vital signs in last 24 hours:  Blood pressure (!) 107/56, pulse 69, temperature 98.2 F (36.8 C), temperature source Oral, resp. rate 20, height 6' (1.829 m), weight 265 lb (120.2 kg), SpO2 98%.    HEENT: Small ulcer at the left buccal mucosa and subungual areas, no thrush Resp: Lungs clear bilaterally Cardio: Regular rate and rhythm GI: No hepatosplenomegaly nontender, no apparent ascites Vascular: No leg edema Neuro: Motor exam appears intact in the legs and feet bilaterally Musculoskeletal: The area of discomfort localizes to the mid to upper lumbar spine, no tenderness, no mass  Portacath/PICC-without erythema  Lab Results:  Lab Results  Component Value Date   WBC 7.0 12/18/2022   HGB 11.4 (L) 12/18/2022   HCT 33.3 (L) 12/18/2022   MCV 98.2 12/18/2022   PLT 185 12/18/2022   NEUTROABS 4.8 12/18/2022    CMP  Lab Results  Component Value Date   NA 137 12/04/2022   K 4.0 12/04/2022   CL 104 12/04/2022   CO2 26 12/04/2022   GLUCOSE 97 12/04/2022   BUN 15 12/04/2022   CREATININE 0.99 12/04/2022    CALCIUM 9.1 12/04/2022   PROT 6.2 (L) 12/04/2022   ALBUMIN 3.8 12/04/2022   AST 12 (L) 12/04/2022   ALT 10 12/04/2022   ALKPHOS 44 12/04/2022   BILITOT 0.6 12/04/2022   GFRNONAA >60 12/04/2022   GFRAA 74 05/20/2020    Lab Results  Component Value Date   CEA1 <0.6 04/13/2022    Medications: I have reviewed the patient's current medications.   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  Internal hemorrhoids.  No specimens collected. Upper endoscopy 04/25/2022-large infiltrative mass with oozing bleeding and stigmata of recent bleeding in the gastric fundus, on the anterior wall of the gastric body and on the greater curvature of the gastric body; deformity in the gastric antrum.  Extrinsic deformity in the entire duodenum.  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 Her2 by IHC negative, 1+; MMR IHC normal; PD-L1 CPS 1%, positive.  Cycle 1 FOLFOX 05/04/2022 Cycle 2 FOLFOX 05/17/2022 Cycle 3 FOLFOX 05/31/2022-oxaliplatin dose reduced secondary to prolonged cold sensitivity Cycle 4 FOLFOX 06/14/2022 Cycle 5 FOLFOX 06/28/2022 CT abdomen/pelvis 07/08/2022-asymmetric wall thickening at  the lesser curvature of the stomach, decreased ascites, increased diffuse omental and peritoneal nodularity Cycle 6 FOLFOX 07/12/2022 Cycle 7 FOLFOX 08/09/2022 Cycle 8 FOLFOX 08/23/2022 Cycle 9 FOLFOX 09/06/2022 CTs 09/11/2022-stable, no new or progressive interval findings Cycle 10 FOLFOX 09/20/2022 Xeloda  maintenance 2 weeks on/1 week off 10/16/2022 Cycle 2 Xeloda 11/06/2022-Xeloda dose reduced secondary to early foot skin toxicity Cycle 3 Xeloda 11/27/2022 Cycle 4 Xeloda 12/18/2022 Truncal rash-status post recent punch biopsy with pathology pending; biopsy reported negative per family 04/26/2022 Lichen planus of the feet Hypothyroidism Atrial fibrillation Admission 07/24/2022 with acute bilateral pulmonary embolism/right heart strain Heparin anticoagulation 07/24/2022 Doppler 07/24/2022-acute DVT of the right femoral, popliteal, posterior tibial, and peroneal veins, acute left posterior tibial DVT 7.  Anemia secondary to chemotherapy, phlebotomy, and hemoptysis-improved     Disposition: [name] has a history of metastatic gastric cancer.  He has been on maintenance Xeloda since June.  He will complete another cycle beginning today.  He will be scheduled for restaging CTs prior to an office visit on 01/05/2023.  [name] has developed increased pain at the lower back.  We discussed the differential diagnosis including lumbar disc disease, a compression fracture, and pain related to metastatic gastric cancer.  He will be referred for a plain x-ray of the lumbar spine today.  He will use oxycodone as needed for pain.  He will call if the oxycodone does not relieve the pain or for development of neurologic symptoms.  We will refer him for an MRI of the spine if the pain persists.  Thornton Papas, MD  12/18/2022  11:33 AM

## 2022-12-19 ENCOUNTER — Encounter: Payer: Self-pay | Admitting: Oncology

## 2022-12-19 ENCOUNTER — Telehealth: Payer: Self-pay

## 2022-12-19 NOTE — Telephone Encounter (Signed)
Message below relayed to pt. Pt verbalizes understanding and will call [facility] with concerns/of pain does not improve

## 2022-12-19 NOTE — Telephone Encounter (Signed)
-----   Message from Thornton Papas sent at 12/19/2022  2:37 PM EDT ----- Please call patient, the spine x-ray reveals a new L1 compression fracture, likely the source for his pain, continue rest and pain medication as needed, hopefully his symptoms will improve over the next few weeks, we will refer him to consider a kyphoplasty procedure if the pain does not improve

## 2022-12-20 ENCOUNTER — Encounter: Payer: Self-pay | Admitting: Oncology

## 2022-12-20 ENCOUNTER — Encounter: Payer: Self-pay | Admitting: Family Medicine

## 2022-12-21 ENCOUNTER — Other Ambulatory Visit: Payer: Self-pay | Admitting: Family Medicine

## 2022-12-22 MED ORDER — TIZANIDINE HCL 4 MG PO TABS: 4.0000 mg | ORAL_TABLET | Freq: Three times a day (TID) | ORAL | 1 refills | Status: DC | PRN

## 2022-12-25 ENCOUNTER — Ambulatory Visit: Payer: Medicare HMO | Admitting: Nurse Practitioner

## 2022-12-25 ENCOUNTER — Other Ambulatory Visit: Payer: Medicare HMO

## 2022-12-25 ENCOUNTER — Inpatient Hospital Stay: Payer: Medicare HMO

## 2022-12-27 ENCOUNTER — Other Ambulatory Visit: Payer: Self-pay | Admitting: Oncology

## 2022-12-28 ENCOUNTER — Encounter: Payer: Self-pay | Admitting: Oncology

## 2022-12-29 ENCOUNTER — Other Ambulatory Visit (HOSPITAL_COMMUNITY): Payer: Self-pay

## 2022-12-29 ENCOUNTER — Other Ambulatory Visit: Payer: Self-pay

## 2022-12-29 ENCOUNTER — Other Ambulatory Visit: Payer: Self-pay | Admitting: Oncology

## 2022-12-29 DIAGNOSIS — C168 Malignant neoplasm of overlapping sites of stomach: Secondary | ICD-10-CM

## 2022-12-29 MED ORDER — CAPECITABINE 500 MG PO TABS
ORAL_TABLET | ORAL | 0 refills | Status: DC
Start: 2022-12-29 — End: 2023-01-05
  Filled 2022-12-29: qty 70, 21d supply, fill #0

## 2023-01-02 ENCOUNTER — Encounter (HOSPITAL_BASED_OUTPATIENT_CLINIC_OR_DEPARTMENT_OTHER): Payer: Self-pay

## 2023-01-02 ENCOUNTER — Other Ambulatory Visit (HOSPITAL_COMMUNITY): Payer: Self-pay

## 2023-01-02 ENCOUNTER — Ambulatory Visit (HOSPITAL_BASED_OUTPATIENT_CLINIC_OR_DEPARTMENT_OTHER)
Admission: RE | Admit: 2023-01-02 | Discharge: 2023-01-02 | Disposition: A | Discharge status: Home / Self Care | Payer: Medicare HMO | Source: Ambulatory Visit | Attending: Oncology | Admitting: Oncology

## 2023-01-02 ENCOUNTER — Inpatient Hospital Stay: Payer: Medicare HMO

## 2023-01-02 DIAGNOSIS — C168 Malignant neoplasm of overlapping sites of stomach: Secondary | ICD-10-CM | POA: Insufficient documentation

## 2023-01-02 MED ORDER — HEPARIN SOD (PORK) LOCK FLUSH 100 UNIT/ML IV SOLN
500.0000 [IU] | Freq: Once | INTRAVENOUS | Status: AC
  Administered 2023-01-02: 500 [IU] via INTRAVENOUS

## 2023-01-02 MED ORDER — IOHEXOL 300 MG/ML  SOLN
100.0000 mL | Freq: Once | INTRAMUSCULAR | Status: AC | PRN
  Administered 2023-01-02: 100 mL via INTRAVENOUS

## 2023-01-04 NOTE — Progress Notes (Signed)
[facility] OFFICE PROGRESS NOTE   Diagnosis: Gastric cancer   INTERVAL HISTORY:   Todd Harrison returns as scheduled.  He completed another cycle of Xeloda beginning 12/18/2022.  No mouth sores or diarrhea.  He has persistent skin thickening, callus formation, and discomfort at the soles.  He reports this predated chemotherapy.  He has altered taste.  He continues to have back pain.  The pain is partially improved.  He takes oxycodone at night.  Objective:  Vital signs in last 24 hours:  Blood pressure 94/60, pulse 63, temperature 98.1 F (36.7 C), temperature source Oral, resp. rate 18, height 6' (1.829 m), weight 257 lb (116.6 kg), SpO2 98%.    HEENT: No thrush or ulcers Resp: Coarse end inspiratory rhonchi at the posterior base bilaterally, no respiratory distress Cardio: Regular rate and rhythm GI: No hepatosplenomegaly, no mass, nontender Vascular: Trace edema at the right lower leg  Skin: Palms without erythema, skin thickening, hyperpigmentation, and callus formation with dry desquamation at the soles  Portacath/PICC-without erythema  Lab Results:  Lab Results  Component Value Date   WBC 7.0 12/18/2022   HGB 11.4 (L) 12/18/2022   HCT 33.3 (L) 12/18/2022   MCV 98.2 12/18/2022   PLT 185 12/18/2022   NEUTROABS 4.8 12/18/2022    CMP  Lab Results  Component Value Date   NA 136 12/18/2022   K 4.2 12/18/2022   CL 102 12/18/2022   CO2 25 12/18/2022   GLUCOSE 99 12/18/2022   BUN 15 12/18/2022   CREATININE 1.09 12/18/2022   CALCIUM 9.2 12/18/2022   PROT 6.4 (L) 12/18/2022   ALBUMIN 3.7 12/18/2022   AST 12 (L) 12/18/2022   ALT 9 12/18/2022   ALKPHOS 57 12/18/2022   BILITOT 0.6 12/18/2022   GFRNONAA >60 12/18/2022   GFRAA 74 05/20/2020    Lab Results  Component Value Date   CEA1 <0.6 04/13/2022      Imaging:  CT CHEST ABDOMEN PELVIS W CONTRAST  Result Date: 01/02/2023 CLINICAL DATA:  Metastatic gastric cancer, on chemotherapy, with new L1  compression fracture and back pain EXAM: CT CHEST, ABDOMEN, AND PELVIS WITH CONTRAST TECHNIQUE: Multidetector CT imaging of the chest, abdomen and pelvis was performed following the standard protocol during bolus administration of intravenous contrast. RADIATION DOSE REDUCTION: This exam was performed according to the departmental dose-optimization program which includes automated exposure control, adjustment of the mA and/or kV according to patient size and/or use of iterative reconstruction technique. CONTRAST:  OMNIPAQUE IOHEXOL 300 MG/ML  SOLN COMPARISON:  Lumbar spine radiograph dated 12/18/2022. CT chest abdomen pelvis dated 09/12/2022. FINDINGS: CT CHEST FINDINGS Cardiovascular: 1 the heart is normal in size. No pericardial effusion. No evidence of thoracic aortic aneurysm. Atherosclerotic calcifications of the aortic arch. Mild) of the LAD and right coronary artery. Right chest port terminates in the mid SVC. Mediastinum/Nodes: No suspicious mediastinal lymphadenopathy. Visualized thyroid is unremarkable. Lungs/Pleura: No suspicious pulmonary nodules. Mild subpleural reticulation in the bilateral lower lobes, suggesting mild chronic interstitial lung disease. No focal consolidation. No pleural effusion or pneumothorax. Musculoskeletal: Degenerative changes of the thoracic spine. CT ABDOMEN PELVIS FINDINGS Hepatobiliary: Stable vascular malformation in the right hepatic lobe (series 2/image 9). Small probable hepatic cysts (series 2/images 51 and 60). Gallbladder is unremarkable. No intrahepatic or extrahepatic duct dilatation. Pancreas: Within normal limits. Spleen: Within normal limits. Adrenals/Urinary Tract: Adrenal glands are within normal limits. Kidneys are within normal limits.  No hydronephrosis. Bladder is underdistended. Stomach/Bowel: Segmental wall thickening/narrowing of the  mid gastric body (series 2/image 66), possibly mildly progressive, likely corresponding to the patient's known  gastric cancer. No evidence of bowel obstruction. Appendix is not discretely visualized. No colonic wall thickening or inflammatory changes. Vascular/Lymphatic: No evidence of abdominal aortic aneurysm. Atherosclerotic calcifications of the abdominal aorta and branch vessels, although vessels remain patent. No suspicious abdominopelvic lymphadenopathy. Reproductive: Prostate is unremarkable. Other: No abdominopelvic ascites. Peritoneal disease/omental caking along loops of bowel, including dominant soft tissue along the anterior mid abdomen (series 2/image 72), unchanged. Musculoskeletal: Mild superior endplate compression fracture deformity at L1, with 30% loss of height, unchanged. No retropulsion. Mild degenerative changes of the lumbar spine. IMPRESSION: Segmental wall thickening/narrowing of the mid gastric body, possibly mildly progressive, likely corresponding to the patient's known gastric cancer. Peritoneal disease/omental caking, unchanged. No evidence of metastatic disease in the chest. Mild superior endplate compression fracture deformity at L1, with 30% loss of height, unchanged. No retropulsion. Electronically Signed   By: Charline Bills M.D.   On: 01/02/2023 17:25    Medications: I have reviewed the patient's current medications.   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  Internal hemorrhoids.  No specimens collected. Upper endoscopy 04/25/2022-large infiltrative mass with oozing bleeding and stigmata of recent bleeding in the  gastric fundus, on the anterior wall of the gastric body and on the greater curvature of the gastric body; deformity in the gastric antrum.  Extrinsic deformity in the entire duodenum.  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 Her2 by IHC negative, 1+; MMR IHC normal; PD-L1 CPS 1%, positive.  Cycle 1 FOLFOX 05/04/2022 Cycle 2 FOLFOX 05/17/2022 Cycle 3 FOLFOX 05/31/2022-oxaliplatin dose reduced secondary to prolonged cold sensitivity Cycle 4 FOLFOX 06/14/2022 Cycle 5 FOLFOX 06/28/2022 CT abdomen/pelvis 07/08/2022-asymmetric wall thickening at the lesser curvature of the stomach, decreased ascites, increased diffuse omental and peritoneal nodularity Cycle 6 FOLFOX 07/12/2022 Cycle 7 FOLFOX 08/09/2022 Cycle 8 FOLFOX 08/23/2022 Cycle 9 FOLFOX 09/06/2022 CTs 09/11/2022-stable, no new or progressive interval findings Cycle 10 FOLFOX 09/20/2022 Xeloda maintenance 2 weeks on/1 week off 10/16/2022 Cycle 2 Xeloda 11/06/2022-Xeloda dose reduced secondary to early foot skin toxicity Cycle 3 Xeloda 11/27/2022 Cycle 4 Xeloda 12/18/2022 CTs 01/02/2023-wall thickening of the mid gastric body-possibly progressive, unchanged peritoneal/omental caking, unchanged L1 compression fracture Truncal rash-status post recent punch biopsy with pathology pending; biopsy reported negative per family 04/26/2022 Lichen planus of the feet Hypothyroidism Atrial fibrillation Admission 07/24/2022 with acute bilateral pulmonary embolism/right heart strain Heparin anticoagulation 07/24/2022 Doppler 07/24/2022-acute DVT of the right femoral, popliteal, posterior tibial, and peroneal veins, acute left posterior tibial DVT 7.  Anemia secondary to chemotherapy, phlebotomy, and hemoptysis-improved 8.  L1 compression fracture on plain x-ray 12/18/2022      Disposition: [facility] Harrison appears unchanged.  I reviewed the restaging CT findings and the images with him.  The CTs are consistent with stable disease.  I  recommend continuing Xeloda.  He will begin another cycle on 01/08/2023.  He will use moisturizers on the feet.  He will try Voltaren gel if the feet are painful.  He will continue oxycodone as needed for the L1 compression fracture.  The compression fracture is most likely benign.  We will consider  an MRI if the pain persist.  Her symptom increase fluid intake and continue nutrition supplements.  Todd Harrison will return for an office visit in 3 weeks.  Thornton Papas, MD  01/05/2023  8:59 AM

## 2023-01-05 ENCOUNTER — Other Ambulatory Visit (HOSPITAL_COMMUNITY): Payer: Self-pay

## 2023-01-05 ENCOUNTER — Other Ambulatory Visit: Payer: Self-pay | Admitting: *Deleted

## 2023-01-05 ENCOUNTER — Other Ambulatory Visit: Payer: Self-pay

## 2023-01-05 ENCOUNTER — Inpatient Hospital Stay: Payer: Medicare HMO | Admitting: Oncology

## 2023-01-05 VITALS — BP 94/60 | HR 63 | Temp 98.1°F | Resp 18 | Ht 72.0 in | Wt 257.0 lb

## 2023-01-05 DIAGNOSIS — C169 Malignant neoplasm of stomach, unspecified: Secondary | ICD-10-CM | POA: Diagnosis not present

## 2023-01-05 DIAGNOSIS — C168 Malignant neoplasm of overlapping sites of stomach: Secondary | ICD-10-CM | POA: Diagnosis not present

## 2023-01-05 MED ORDER — OXYCODONE-ACETAMINOPHEN 5-325 MG PO TABS: 1.0000 | ORAL_TABLET | Freq: Four times a day (QID) | ORAL | 0 refills | Status: DC | PRN

## 2023-01-05 MED ORDER — CAPECITABINE 500 MG PO TABS
ORAL_TABLET | ORAL | 0 refills | Status: DC
  Filled 2023-01-05: qty 98, fill #0
  Filled 2023-01-17: qty 70, 21d supply, fill #0
  Filled 2023-01-24: qty 98, 21d supply, fill #0

## 2023-01-17 ENCOUNTER — Other Ambulatory Visit: Payer: Self-pay

## 2023-01-17 ENCOUNTER — Other Ambulatory Visit: Payer: Self-pay | Admitting: Oncology

## 2023-01-17 ENCOUNTER — Other Ambulatory Visit (HOSPITAL_COMMUNITY): Payer: Self-pay

## 2023-01-17 ENCOUNTER — Encounter: Payer: Self-pay | Admitting: Cardiology

## 2023-01-17 MED ORDER — TRAMADOL HCL 50 MG PO TABS: 50.0000 mg | ORAL_TABLET | Freq: Four times a day (QID) | ORAL | 0 refills | Status: DC | PRN

## 2023-01-24 ENCOUNTER — Other Ambulatory Visit: Payer: Self-pay | Admitting: Pharmacist

## 2023-01-24 ENCOUNTER — Other Ambulatory Visit (HOSPITAL_COMMUNITY): Payer: Self-pay

## 2023-01-24 ENCOUNTER — Other Ambulatory Visit: Payer: Self-pay

## 2023-01-24 DIAGNOSIS — C168 Malignant neoplasm of overlapping sites of stomach: Secondary | ICD-10-CM

## 2023-01-24 MED ORDER — CAPECITABINE 500 MG PO TABS
ORAL_TABLET | ORAL | 0 refills | Status: AC
Start: 2023-01-24 — End: ?
  Filled 2023-01-24: qty 70, 21d supply, fill #0

## 2023-01-24 NOTE — Progress Notes (Signed)
Quantity on rx too much based on the instructions, quantity adjusted to match the instructions.

## 2023-01-25 ENCOUNTER — Other Ambulatory Visit (HOSPITAL_COMMUNITY): Payer: Self-pay

## 2023-01-26 ENCOUNTER — Inpatient Hospital Stay: Payer: Medicare HMO | Admitting: Nurse Practitioner

## 2023-01-26 ENCOUNTER — Inpatient Hospital Stay: Payer: Medicare HMO | Attending: Nurse Practitioner

## 2023-01-26 ENCOUNTER — Encounter: Payer: Self-pay | Admitting: Nurse Practitioner

## 2023-01-26 ENCOUNTER — Inpatient Hospital Stay: Payer: Medicare HMO

## 2023-01-26 VITALS — BP 96/64 | HR 72 | Temp 98.1°F | Resp 18 | Ht 72.0 in | Wt 252.5 lb

## 2023-01-26 DIAGNOSIS — D6481 Anemia due to antineoplastic chemotherapy: Secondary | ICD-10-CM | POA: Diagnosis not present

## 2023-01-26 DIAGNOSIS — Z86718 Personal history of other venous thrombosis and embolism: Secondary | ICD-10-CM | POA: Diagnosis not present

## 2023-01-26 DIAGNOSIS — C169 Malignant neoplasm of stomach, unspecified: Secondary | ICD-10-CM | POA: Insufficient documentation

## 2023-01-26 DIAGNOSIS — R609 Edema, unspecified: Secondary | ICD-10-CM | POA: Insufficient documentation

## 2023-01-26 DIAGNOSIS — I4891 Unspecified atrial fibrillation: Secondary | ICD-10-CM | POA: Insufficient documentation

## 2023-01-26 DIAGNOSIS — M4856XA Collapsed vertebra, not elsewhere classified, lumbar region, initial encounter for fracture: Secondary | ICD-10-CM | POA: Diagnosis not present

## 2023-01-26 DIAGNOSIS — Z86711 Personal history of pulmonary embolism: Secondary | ICD-10-CM | POA: Insufficient documentation

## 2023-01-26 DIAGNOSIS — L439 Lichen planus, unspecified: Secondary | ICD-10-CM | POA: Insufficient documentation

## 2023-01-26 DIAGNOSIS — E039 Hypothyroidism, unspecified: Secondary | ICD-10-CM | POA: Diagnosis not present

## 2023-01-26 DIAGNOSIS — C168 Malignant neoplasm of overlapping sites of stomach: Secondary | ICD-10-CM

## 2023-01-26 DIAGNOSIS — Z95828 Presence of other vascular implants and grafts: Secondary | ICD-10-CM

## 2023-01-26 LAB — CBC WITH DIFFERENTIAL (CANCER CENTER ONLY)
Abs Immature Granulocytes: 0.02 10*3/uL (ref 0.00–0.07)
Basophils Absolute: 0.1 10*3/uL (ref 0.0–0.1)
Basophils Relative: 1 %
Eosinophils Absolute: 0.4 10*3/uL (ref 0.0–0.5)
Eosinophils Relative: 6 %
HCT: 32.8 % — ABNORMAL LOW (ref 39.0–52.0)
Hemoglobin: 11.3 g/dL — ABNORMAL LOW (ref 13.0–17.0)
Immature Granulocytes: 0 %
Lymphocytes Relative: 26 %
Lymphs Abs: 1.7 10*3/uL (ref 0.7–4.0)
MCH: 34.1 pg — ABNORMAL HIGH (ref 26.0–34.0)
MCHC: 34.5 g/dL (ref 30.0–36.0)
MCV: 99.1 fL (ref 80.0–100.0)
Monocytes Absolute: 0.6 10*3/uL (ref 0.1–1.0)
Monocytes Relative: 9 %
Neutro Abs: 3.8 10*3/uL (ref 1.7–7.7)
Neutrophils Relative %: 58 %
Platelet Count: 175 10*3/uL (ref 150–400)
RBC: 3.31 MIL/uL — ABNORMAL LOW (ref 4.22–5.81)
RDW: 17.2 % — ABNORMAL HIGH (ref 11.5–15.5)
WBC Count: 6.6 10*3/uL (ref 4.0–10.5)
nRBC: 0 % (ref 0.0–0.2)

## 2023-01-26 LAB — CMP (CANCER CENTER ONLY)
ALT: 7 U/L (ref 0–44)
AST: 12 U/L — ABNORMAL LOW (ref 15–41)
Albumin: 3.5 g/dL (ref 3.5–5.0)
Alkaline Phosphatase: 53 U/L (ref 38–126)
Anion gap: 8 (ref 5–15)
BUN: 14 mg/dL (ref 8–23)
CO2: 25 mmol/L (ref 22–32)
Calcium: 8.9 mg/dL (ref 8.9–10.3)
Chloride: 106 mmol/L (ref 98–111)
Creatinine: 1.07 mg/dL (ref 0.61–1.24)
GFR, Estimated: >60 mL/min (ref >60)
Glucose, Bld: 116 mg/dL — ABNORMAL HIGH (ref 70–99)
Potassium: 3.7 mmol/L (ref 3.5–5.1)
Sodium: 139 mmol/L (ref 135–145)
Total Bilirubin: 0.5 mg/dL (ref 0.3–1.2)
Total Protein: 6.3 g/dL — ABNORMAL LOW (ref 6.5–8.1)

## 2023-01-26 MED ORDER — SODIUM CHLORIDE 0.9% FLUSH
10.0000 mL | INTRAVENOUS | Status: DC | PRN
  Administered 2023-01-26: 10 mL via INTRAVENOUS

## 2023-01-26 MED ORDER — FUROSEMIDE 20 MG PO TABS
20.0000 mg | ORAL_TABLET | Freq: Every day | ORAL | 0 refills | Status: DC | PRN
Start: 2023-01-26 — End: 2023-01-26

## 2023-01-26 MED ORDER — HEPARIN SOD (PORK) LOCK FLUSH 100 UNIT/ML IV SOLN
500.0000 [IU] | Freq: Once | INTRAVENOUS | Status: AC
  Administered 2023-01-26: 500 [IU] via INTRAVENOUS

## 2023-01-26 MED ORDER — OXYCODONE-ACETAMINOPHEN 5-325 MG PO TABS
1.0000 | ORAL_TABLET | Freq: Four times a day (QID) | ORAL | 0 refills | Status: DC | PRN
Start: 2023-01-26 — End: 2023-02-15

## 2023-01-26 NOTE — Addendum Note (Signed)
Addended by: Rana Snare on: 01/26/2023 10:11 AM   Modules accepted: Orders

## 2023-01-26 NOTE — Progress Notes (Signed)
[facility] OFFICE PROGRESS NOTE   Diagnosis: Gastric cancer  INTERVAL HISTORY:   Todd Harrison returns as scheduled.  He completed another cycle of Xeloda beginning 01/08/2023.  He had an episode of nausea a few days ago.  No mouth sores.  No diarrhea.  Palms and soles without erythema.  Feet "hurt" due to lichen planus.  Symptoms predated chemotherapy.  He developed recurrent "back spasms" affecting the right mid back yesterday.  He notes continued edema of the right lower leg and wonders if he could resume Lasix.  Main complaint is an alteration in taste.  Objective:  Vital signs in last 24 hours:  Blood pressure 96/64, pulse 72, temperature 98.1 F (36.7 C), temperature source Temporal, resp. rate 18, height 6' (1.829 m), weight 252 lb 8 oz (114.5 kg), SpO2 97%.    HEENT: No thrush or ulcers. Resp: Lungs clear bilaterally. Cardio: Regular rate and rhythm. GI: No hepatosplenomegaly. Vascular: Trace edema right lower leg. Skin: Palms without erythema, no skin breakdown.  Soles are hyperpigmented, dry appearing. Port-A-Cath without erythema.   Lab Results:  Lab Results  Component Value Date   WBC 6.6 01/26/2023   HGB 11.3 (L) 01/26/2023   HCT 32.8 (L) 01/26/2023   MCV 99.1 01/26/2023   PLT 175 01/26/2023   NEUTROABS 3.8 01/26/2023    Imaging:  No results found.  Medications: I have reviewed the patient's current medications.  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  Internal hemorrhoids.  No  specimens collected. Upper endoscopy 04/25/2022-large infiltrative mass with oozing bleeding and stigmata of recent bleeding in the gastric fundus, on the anterior wall of the gastric body and on the greater curvature of the gastric body; deformity in the gastric antrum.  Extrinsic deformity in the entire duodenum.  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 Her2 by IHC negative, 1+; MMR IHC normal; PD-L1 CPS 1%, positive.  Cycle 1 FOLFOX 05/04/2022 Cycle 2 FOLFOX 05/17/2022 Cycle 3 FOLFOX 05/31/2022-oxaliplatin dose reduced secondary to prolonged cold sensitivity Cycle 4 FOLFOX 06/14/2022 Cycle 5 FOLFOX 06/28/2022 CT abdomen/pelvis 07/08/2022-asymmetric wall thickening at the lesser curvature of the stomach, decreased ascites, increased diffuse omental and peritoneal nodularity Cycle 6 FOLFOX 07/12/2022 Cycle 7 FOLFOX 08/09/2022 Cycle 8 FOLFOX 08/23/2022 Cycle 9 FOLFOX 09/06/2022 CTs 09/11/2022-stable, no new or progressive interval findings Cycle 10 FOLFOX 09/20/2022 Xeloda maintenance 2 weeks on/1 week off 10/16/2022 Cycle 2 Xeloda 11/06/2022-Xeloda dose reduced secondary to early foot skin toxicity Cycle 3 Xeloda 11/27/2022 Cycle 4 Xeloda 12/18/2022 CTs 01/02/2023-wall thickening of the mid gastric body-possibly progressive, unchanged peritoneal/omental caking, unchanged L1 compression fracture Cycle 5 Xeloda beginning 01/08/2023 Cycle 6 Xeloda beginning 01/29/2023 Truncal rash-status post recent punch biopsy with pathology pending; biopsy reported negative per family 04/26/2022 Lichen planus of the feet Hypothyroidism Atrial fibrillation Admission 07/24/2022 with acute bilateral pulmonary embolism/right heart strain Heparin anticoagulation 07/24/2022 Doppler 07/24/2022-acute DVT of the right femoral, popliteal, posterior tibial, and peroneal veins, acute left posterior tibial DVT 7.  Anemia secondary to chemotherapy, phlebotomy, and hemoptysis-improved 8.  L1  compression fracture on plain x-ray 12/18/2022  Disposition: Mr. Tonjes appears unchanged.  He completed another cycle of Xeloda beginning 01/08/2023.  He  seems to be tolerating Xeloda well.  There is no clinical evidence of disease progression.  He will begin the next cycle as scheduled on 01/29/2023.  CBC and chemistry panel reviewed.  Labs adequate to continue with Xeloda as above.  He has intermittent edema of the right lower leg, reports he has taken Lasix in the past.  Given his borderline blood pressure we decided against resuming Lasix.  He will try elevation.  He will return for lab and follow-up in 3 weeks.  He will contact the office in the interim with any problems.    Lonna Cobb ANP/GNP-BC   01/26/2023  9:15 AM

## 2023-01-29 ENCOUNTER — Ambulatory Visit
Admission: RE | Admit: 2023-01-29 | Discharge: 2023-01-29 | Disposition: A | Discharge status: Home / Self Care | Payer: Medicare HMO | Source: Ambulatory Visit | Attending: Family Medicine | Admitting: Family Medicine

## 2023-01-29 ENCOUNTER — Ambulatory Visit: Payer: Medicare HMO | Admitting: Family Medicine

## 2023-01-29 VITALS — BP 108/74 | Ht 72.0 in | Wt 254.0 lb

## 2023-01-29 DIAGNOSIS — G8929 Other chronic pain: Secondary | ICD-10-CM

## 2023-01-29 DIAGNOSIS — M545 Low back pain, unspecified: Secondary | ICD-10-CM | POA: Diagnosis not present

## 2023-01-29 NOTE — Patient Instructions (Signed)
Get x-rays after you leave today of your lumbar spine - I'm concerned with this pain worsening again that you may have a new compression fracture. If these are normal we may go ahead with an MRI. Add topical capsaicin, salon pas, or biofreeze to your current medication regimen. Start physical therapy assuming the x-rays look normal to consider dry needling as well. Follow up will depend on imaging results.

## 2023-01-29 NOTE — Progress Notes (Unsigned)
PCP: Burton Apley, MD  Subjective:   HPI: Patient is a 77 y.o. male here for lumbar back pain.  12/18/2022 - Compression fracture found on Abdomen CT for monitoring of stomach cancer.  Pain is not constant and is present on both left and right sides.  It is worst right now on his left side. Feels that muscle spasms are also contributing. Has gotten worse over the past week. No new injuries. Sleeping in reclined position has helped. Oxycodone, Tramadol, and Tizanidine have helped some.  Notes that he has taken methylprednisone in the past for his skin rash but has not over the past month.  Past Medical History:  Diagnosis Date   A-fib (HCC)    Asthma    Cataract    Gastric cancer (HCC)    GERD (gastroesophageal reflux disease)    Lichen planus    Methotrexate, long term, current use    no longer taking   Psoriasis    Sleep apnea    Thyroid disease    Urticaria     Current Outpatient Medications on File Prior to Visit  Medication Sig Dispense Refill   acitretin (SORIATANE) 25 MG capsule Take 25 mg by mouth daily.     albuterol (VENTOLIN HFA) 108 (90 Base) MCG/ACT inhaler Inhale 2 puffs into the lungs every 6 (six) hours as needed for wheezing or shortness of breath. (Patient not taking: Reported on 12/04/2022) 8 g 1   capecitabine (XELODA) 500 MG tablet Take 3 tablets (1500 mg) by mouth in AM and 2 tablets (1000 mg) in PM. Take with food. Take for 14 days, then hold for 7 days. Repeat every 21 days. Start cycle on 01/19/2023 70 tablet 0   enoxaparin (LOVENOX) 120 MG/0.8ML injection Inject 0.8 mLs (120 mg total) into the skin every 12 (twelve) hours. 48 mL 5   fluticasone (FLONASE) 50 MCG/ACT nasal spray Place 1 spray into both nostrils daily as needed for allergies or rhinitis. (Patient not taking: Reported on 12/04/2022)     fluticasone furoate-vilanterol (BREO ELLIPTA) 100-25 MCG/ACT AEPB Inhale 1 puff into the lungs daily. (Patient not taking: Reported on 12/04/2022) 60 each 6    hydroxychloroquine (PLAQUENIL) 200 MG tablet Take 200 mg by mouth 2 (two) times daily.     ISOtretinoin (ACCUTANE) 40 MG capsule Take 40 mg by mouth 2 (two) times daily. (Patient not taking: Reported on 12/04/2022)     KLOR-CON M20 20 MEQ tablet TAKE 1 TABLET BY MOUTH EVERY DAY 90 tablet 1   levothyroxine (SYNTHROID) 112 MCG tablet Take 112 mcg by mouth daily before breakfast.     lidocaine-prilocaine (EMLA) cream Apply 1 Application topically as needed (Apply 1 hour before coming to [facility] for accessing). 30 g 2   magic mouthwash (nystatin, diphenhydrAMINE, alum & mag hydroxide) suspension mixture Swish and spit 5 mLs 4 (four) times daily as needed for mouth pain 240 mL 1   metoprolol succinate (TOPROL-XL) 50 MG 24 hr tablet TAKE 1 TABLET BY MOUTH EVERY DAY 90 tablet 3   omeprazole (PRILOSEC) 40 MG capsule Take 40 mg by mouth daily.     ondansetron (ZOFRAN) 8 MG tablet Take 1 tablet (8 mg total) by mouth every 8 (eight) hours as needed for nausea or vomiting. Do not take within 72 hours of day 1 chemo 30 tablet 1   oxyCODONE-acetaminophen (PERCOCET/ROXICET) 5-325 MG tablet Take 1 tablet by mouth every 6 (six) hours as needed for severe pain (Pain not relieved with tramadol). 30 tablet  0   polyethylene glycol (MIRALAX / GLYCOLAX) 17 g packet Take 17 g by mouth daily.     prochlorperazine (COMPAZINE) 10 MG tablet Take 1 tablet (10 mg total) by mouth every 6 (six) hours as needed for nausea or vomiting. (Patient not taking: Reported on 12/04/2022) 30 tablet 1   tiZANidine (ZANAFLEX) 4 MG tablet Take 1 tablet (4 mg total) by mouth every 8 (eight) hours as needed. 60 tablet 1   traMADol (ULTRAM) 50 MG tablet Take 1 tablet (50 mg total) by mouth every 6 (six) hours as needed for moderate pain. 60 tablet 0   triamcinolone (KENALOG) 0.1 % Apply 1 application  topically daily as needed (lichen planus flare).     No current facility-administered medications on file prior to visit.    Past Surgical  History:  Procedure Laterality Date   CARDIOVERSION N/A 02/23/2020   Procedure: CARDIOVERSION;  Surgeon: Christell Constant, MD;  Location: [facility] ENDOSCOPY;  Service: Cardiovascular;  Laterality: N/A;   CARDIOVERSION N/A 04/12/2020   Procedure: CARDIOVERSION;  Surgeon: Jodelle Red, MD;  Location: [facility] ENDOSCOPY;  Service: Cardiovascular;  Laterality: N/A;   CATARACT EXTRACTION Left    2019   IR IMAGING GUIDED PORT INSERTION  05/03/2022   TONSILLECTOMY      Allergies  Allergen Reactions   Gold Itching   Mixed Grasses     unknown    BP 108/74   Ht 6' (1.829 m)   Wt 254 lb (115.2 kg)   BMI 34.45 kg/m      03/30/2020   11:02 AM  [facility] Adult Exercise  Frequency of aerobic exercise (# of days/week) 0  Average time in minutes 0  Frequency of strengthening activities (# of days/week) 0        No data to display              Objective:  Physical Exam:  Gen: NAD, comfortable in exam room  Lumbar spine Inspection: No obvious deformity, swelling, ecchymoses Palpation: Nontender to palpation along spinous processes Nontender to palpation on lumbar paraspinal muscles Left lumbar paraspinal muscles noticeably tight Range of Motion: Range of motion of back in all directions limited due to pain Special Tests Negative straight leg raise Strength: 5/5 in lumbosacral plexus L2-S2 distribution    Assessment & Plan:   Assessment & Plan Chronic bilateral low back pain without sciatica Suspect today that patient's pain is primarily due to continued lumbar musculature strain as he does not have clinical pattern seen with sciatica and his pain does not localize over his spinous processes with his known L1 compression fracture.  It is concerning that his pain has worsened over the past week, and due to this it is necessary to rule out new compression fracture despite no history of new trauma.  He is significantly at risk given known current  compression fracture and extended steroid use.  Will repeat x-rays of his lumbar spine.  If these do not reveal new compression fracture, plan to restart physical therapy.  Consider MRI if patient continues to have severe pain without improvement with physical therapy and lumbar plain films are negative.  Discussed topical treatments for pain as he is receiving opioid pain management for his gastric cancer.  Some or all of this note was dictated use Dragon software, there may be unintentional errors present.  Celine Mans, MD, PGY-2 [facility] 5:19 PM 01/29/2023

## 2023-01-30 ENCOUNTER — Encounter: Payer: Self-pay | Admitting: Family Medicine

## 2023-01-30 NOTE — Addendum Note (Signed)
Addended by: Rutha Bouchard E on: 01/30/2023 03:53 PM   Modules accepted: Orders

## 2023-02-02 ENCOUNTER — Ambulatory Visit (HOSPITAL_BASED_OUTPATIENT_CLINIC_OR_DEPARTMENT_OTHER)
Admission: RE | Admit: 2023-02-02 | Discharge: 2023-02-02 | Disposition: A | Discharge status: Home / Self Care | Payer: Medicare HMO | Source: Ambulatory Visit | Attending: Family Medicine | Admitting: Family Medicine

## 2023-02-02 DIAGNOSIS — M545 Low back pain, unspecified: Secondary | ICD-10-CM | POA: Insufficient documentation

## 2023-02-02 DIAGNOSIS — G8929 Other chronic pain: Secondary | ICD-10-CM | POA: Insufficient documentation

## 2023-02-07 ENCOUNTER — Other Ambulatory Visit: Payer: Self-pay | Admitting: Family Medicine

## 2023-02-08 ENCOUNTER — Other Ambulatory Visit: Payer: Self-pay

## 2023-02-08 ENCOUNTER — Encounter: Payer: Self-pay | Admitting: Oncology

## 2023-02-08 ENCOUNTER — Other Ambulatory Visit (HOSPITAL_COMMUNITY): Payer: Self-pay

## 2023-02-08 ENCOUNTER — Other Ambulatory Visit: Payer: Self-pay | Admitting: Oncology

## 2023-02-08 DIAGNOSIS — C168 Malignant neoplasm of overlapping sites of stomach: Secondary | ICD-10-CM

## 2023-02-08 MED ORDER — CAPECITABINE 500 MG PO TABS
ORAL_TABLET | ORAL | 0 refills | Status: DC
  Filled 2023-02-08: qty 70, 21d supply, fill #0

## 2023-02-08 NOTE — Progress Notes (Signed)
Specialty Pharmacy Refill Coordination Note  Todd Harrison is a 77 y.o. male contacted today regarding refills of specialty medication(s) Capecitabine   Patient requested Delivery   Delivery date: 02/15/23   Verified address: [address] [name] [address]   Medication will be filled on 02/14/23. Refill pending approval.

## 2023-02-09 ENCOUNTER — Other Ambulatory Visit: Payer: Self-pay

## 2023-02-09 NOTE — Progress Notes (Signed)
Refill received

## 2023-02-14 ENCOUNTER — Encounter: Payer: Self-pay | Admitting: Oncology

## 2023-02-14 ENCOUNTER — Other Ambulatory Visit: Payer: Self-pay

## 2023-02-15 ENCOUNTER — Inpatient Hospital Stay: Payer: Medicare HMO | Attending: Nurse Practitioner

## 2023-02-15 ENCOUNTER — Inpatient Hospital Stay: Payer: Medicare HMO

## 2023-02-15 ENCOUNTER — Other Ambulatory Visit: Payer: Self-pay | Admitting: *Deleted

## 2023-02-15 ENCOUNTER — Inpatient Hospital Stay: Payer: Medicare HMO | Admitting: Oncology

## 2023-02-15 VITALS — BP 98/72 | HR 76 | Temp 98.1°F | Resp 18 | Ht 72.0 in | Wt 245.0 lb

## 2023-02-15 DIAGNOSIS — C786 Secondary malignant neoplasm of retroperitoneum and peritoneum: Secondary | ICD-10-CM | POA: Insufficient documentation

## 2023-02-15 DIAGNOSIS — M4856XA Collapsed vertebra, not elsewhere classified, lumbar region, initial encounter for fracture: Secondary | ICD-10-CM | POA: Diagnosis not present

## 2023-02-15 DIAGNOSIS — Z23 Encounter for immunization: Secondary | ICD-10-CM

## 2023-02-15 DIAGNOSIS — R21 Rash and other nonspecific skin eruption: Secondary | ICD-10-CM | POA: Diagnosis not present

## 2023-02-15 DIAGNOSIS — Z86711 Personal history of pulmonary embolism: Secondary | ICD-10-CM | POA: Diagnosis not present

## 2023-02-15 DIAGNOSIS — D6481 Anemia due to antineoplastic chemotherapy: Secondary | ICD-10-CM | POA: Diagnosis not present

## 2023-02-15 DIAGNOSIS — Z86718 Personal history of other venous thrombosis and embolism: Secondary | ICD-10-CM | POA: Diagnosis not present

## 2023-02-15 DIAGNOSIS — I4891 Unspecified atrial fibrillation: Secondary | ICD-10-CM | POA: Diagnosis not present

## 2023-02-15 DIAGNOSIS — E039 Hypothyroidism, unspecified: Secondary | ICD-10-CM | POA: Diagnosis not present

## 2023-02-15 DIAGNOSIS — L439 Lichen planus, unspecified: Secondary | ICD-10-CM | POA: Insufficient documentation

## 2023-02-15 DIAGNOSIS — C168 Malignant neoplasm of overlapping sites of stomach: Secondary | ICD-10-CM

## 2023-02-15 DIAGNOSIS — C169 Malignant neoplasm of stomach, unspecified: Secondary | ICD-10-CM | POA: Insufficient documentation

## 2023-02-15 DIAGNOSIS — Z95828 Presence of other vascular implants and grafts: Secondary | ICD-10-CM

## 2023-02-15 LAB — CMP (CANCER CENTER ONLY)
ALT: 7 U/L (ref 0–44)
AST: 12 U/L — ABNORMAL LOW (ref 15–41)
Albumin: 3.7 g/dL (ref 3.5–5.0)
Alkaline Phosphatase: 59 U/L (ref 38–126)
Anion gap: 7 (ref 5–15)
BUN: 20 mg/dL (ref 8–23)
CO2: 26 mmol/L (ref 22–32)
Calcium: 9.3 mg/dL (ref 8.9–10.3)
Chloride: 105 mmol/L (ref 98–111)
Creatinine: 1 mg/dL (ref 0.61–1.24)
GFR, Estimated: >60 mL/min (ref >60)
Glucose, Bld: 116 mg/dL — ABNORMAL HIGH (ref 70–99)
Potassium: 4.3 mmol/L (ref 3.5–5.1)
Sodium: 138 mmol/L (ref 135–145)
Total Bilirubin: 0.6 mg/dL (ref 0.3–1.2)
Total Protein: 6.3 g/dL — ABNORMAL LOW (ref 6.5–8.1)

## 2023-02-15 LAB — CBC WITH DIFFERENTIAL (CANCER CENTER ONLY)
Abs Immature Granulocytes: 0.03 10*3/uL (ref 0.00–0.07)
Basophils Absolute: 0.1 10*3/uL (ref 0.0–0.1)
Basophils Relative: 1 %
Eosinophils Absolute: 0.3 10*3/uL (ref 0.0–0.5)
Eosinophils Relative: 4 %
HCT: 34.5 % — ABNORMAL LOW (ref 39.0–52.0)
Hemoglobin: 11.9 g/dL — ABNORMAL LOW (ref 13.0–17.0)
Immature Granulocytes: 0 %
Lymphocytes Relative: 23 %
Lymphs Abs: 1.9 10*3/uL (ref 0.7–4.0)
MCH: 34.2 pg — ABNORMAL HIGH (ref 26.0–34.0)
MCHC: 34.5 g/dL (ref 30.0–36.0)
MCV: 99.1 fL (ref 80.0–100.0)
Monocytes Absolute: 0.7 10*3/uL (ref 0.1–1.0)
Monocytes Relative: 9 %
Neutro Abs: 5.2 10*3/uL (ref 1.7–7.7)
Neutrophils Relative %: 63 %
Platelet Count: 181 10*3/uL (ref 150–400)
RBC: 3.48 MIL/uL — ABNORMAL LOW (ref 4.22–5.81)
RDW: 17.2 % — ABNORMAL HIGH (ref 11.5–15.5)
WBC Count: 8.3 10*3/uL (ref 4.0–10.5)
nRBC: 0 % (ref 0.0–0.2)

## 2023-02-15 MED ORDER — SODIUM CHLORIDE 0.9% FLUSH
10.0000 mL | INTRAVENOUS | Status: DC | PRN
  Administered 2023-02-15: 10 mL via INTRAVENOUS

## 2023-02-15 MED ORDER — POTASSIUM CHLORIDE CRYS ER 10 MEQ PO TBCR
20.0000 meq | EXTENDED_RELEASE_TABLET | Freq: Every day | ORAL | 3 refills | Status: DC
Start: 2023-02-15 — End: 2023-05-14

## 2023-02-15 MED ORDER — HEPARIN SOD (PORK) LOCK FLUSH 100 UNIT/ML IV SOLN
500.0000 [IU] | Freq: Once | INTRAVENOUS | Status: AC
  Administered 2023-02-15: 500 [IU] via INTRAVENOUS

## 2023-02-15 MED ORDER — INFLUENZA VAC A&B SURF ANT ADJ 0.5 ML IM SUSY
0.5000 mL | PREFILLED_SYRINGE | Freq: Once | INTRAMUSCULAR | Status: AC
  Administered 2023-02-15: 0.5 mL via INTRAMUSCULAR
  Filled 2023-02-15: qty 0.5

## 2023-02-15 MED ORDER — OXYCODONE-ACETAMINOPHEN 5-325 MG PO TABS
1.0000 | ORAL_TABLET | Freq: Every day | ORAL | 0 refills | Status: DC | PRN
Start: 2023-02-15 — End: 2023-08-22

## 2023-02-15 NOTE — Progress Notes (Signed)
[facility] OFFICE PROGRESS NOTE   Diagnosis: Gastric cancer  INTERVAL HISTORY:   Todd Harrison returns as scheduled.  He completed another cycle of Xeloda maintenance 01/29/2023.  No mouth sores or diarrhea.  He continues to have hyperpigmentation at the left greater than right foot.  He has dryness over the trunk. Back pain has improved.  He takes oxycodone at night. Objective:  Vital signs in last 24 hours:  Blood pressure 98/72, pulse 76, temperature 98.1 F (36.7 C), temperature source Temporal, resp. rate 18, height 6' (1.829 m), weight 245 lb (111.1 kg), SpO2 98%.    HEENT: No thrush, 3-4 mm ulcer at the left buccal mucosa Resp: Lungs clear bilaterally Cardio: Regular rate and rhythm, 2/6 systolic murmur GI: No mass, no hepatosplenomegaly Vascular: The right lower leg is slightly larger than the left side with support stockings in place  Skin: Dry mildly erythematous rash over the back, hyperpigmentation, dryness, and areas of dry desquamation of the left sole  Portacath/PICC-without erythema  Lab Results:  Lab Results  Component Value Date   WBC 8.3 02/15/2023   HGB 11.9 (L) 02/15/2023   HCT 34.5 (L) 02/15/2023   MCV 99.1 02/15/2023   PLT 181 02/15/2023   NEUTROABS 5.2 02/15/2023    CMP  Lab Results  Component Value Date   NA 138 02/15/2023   K 4.3 02/15/2023   CL 105 02/15/2023   CO2 26 02/15/2023   GLUCOSE 116 (H) 02/15/2023   BUN 20 02/15/2023   CREATININE 1.00 02/15/2023   CALCIUM 9.3 02/15/2023   PROT 6.3 (L) 02/15/2023   ALBUMIN 3.7 02/15/2023   AST 12 (L) 02/15/2023   ALT 7 02/15/2023   ALKPHOS 59 02/15/2023   BILITOT 0.6 02/15/2023   GFRNONAA >60 02/15/2023   GFRAA 74 05/20/2020    Lab Results  Component Value Date   CEA1 <0.6 04/13/2022    Medications: I have reviewed the patient's current medications.   Assessment/Plan: Gastric cancer with CT evidence of abdominal carcinomatosis CT abdomen/pelvis 04/13/2022-ascites,  diffuse omental and peritoneal nodularity, possible circumferential mass in the transverse colon, new small left greater than right pleural effusions CT abdominal mass biopsy 04/24/2022-metastatic poorly differentiated adenocarcinoma with very focal signet ring cell features CT paracentesis 04/24/2022-no malignant cells identified Colonoscopy 04/25/2022-diverticulosis in the sigmoid colon.  Internal hemorrhoids.  No specimens collected. Upper endoscopy 04/25/2022-large infiltrative mass with oozing bleeding and stigmata of recent bleeding in the gastric fundus, on the anterior wall of the gastric body and on the greater curvature of the gastric body; deformity in the gastric antrum.  Extrinsic deformity in the entire duodenum.  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 Her2 by IHC negative, 1+; MMR IHC normal; PD-L1 CPS 1%, positive.  Cycle 1 FOLFOX 05/04/2022 Cycle 2 FOLFOX 05/17/2022 Cycle 3 FOLFOX 05/31/2022-oxaliplatin dose reduced secondary to prolonged cold sensitivity Cycle 4 FOLFOX 06/14/2022 Cycle 5 FOLFOX 06/28/2022 CT abdomen/pelvis 07/08/2022-asymmetric wall thickening at the lesser curvature of the stomach, decreased ascites, increased diffuse omental and peritoneal nodularity Cycle 6 FOLFOX 07/12/2022 Cycle 7 FOLFOX 08/09/2022 Cycle 8 FOLFOX 08/23/2022 Cycle 9 FOLFOX 09/06/2022 CTs 09/11/2022-stable, no new or progressive interval findings Cycle 10 FOLFOX 09/20/2022 Xeloda maintenance 2 weeks on/1 week off 10/16/2022 Cycle 2 Xeloda 11/06/2022-Xeloda dose reduced secondary to early foot skin toxicity Cycle 3 Xeloda 11/27/2022 Cycle 4 Xeloda 12/18/2022 CTs 01/02/2023-wall thickening of the mid gastric body-possibly progressive, unchanged peritoneal/omental caking, unchanged L1 compression fracture Cycle 5 Xeloda beginning 01/08/2023 Cycle 6  Xeloda beginning 01/29/2023 Cycle 7 Xeloda beginning 02/19/2023 Truncal rash-status post recent punch biopsy with  pathology pending; biopsy reported negative per family 04/26/2022 Lichen planus of the feet Hypothyroidism Atrial fibrillation Admission 07/24/2022 with acute bilateral pulmonary embolism/right heart strain Heparin anticoagulation 07/24/2022 Doppler 07/24/2022-acute DVT of the right femoral, popliteal, posterior tibial, and peroneal veins, acute left posterior tibial DVT 7.  Anemia secondary to chemotherapy, phlebotomy, and hemoptysis-improved 8.  L1 compression fracture on plain x-ray 12/18/2022 MRI lumbar spine 02/02/2023-acute/subacute L1 compression fracture, mild degenerative change of the lumbar spine    Disposition: Todd Harrison appears stable.  He is tolerating the Xeloda well.  He will complete another cycle beginning 02/19/2023.  He will return for an office and lab visit in 3 weeks.  Todd Papas, MD  02/15/2023  2:11 PM

## 2023-02-15 NOTE — Patient Instructions (Signed)

## 2023-02-21 ENCOUNTER — Other Ambulatory Visit: Payer: Self-pay

## 2023-02-23 ENCOUNTER — Other Ambulatory Visit: Payer: Self-pay

## 2023-02-25 ENCOUNTER — Encounter: Payer: Self-pay | Admitting: Oncology

## 2023-02-26 ENCOUNTER — Other Ambulatory Visit: Payer: Self-pay

## 2023-02-26 DIAGNOSIS — C168 Malignant neoplasm of overlapping sites of stomach: Secondary | ICD-10-CM

## 2023-02-26 MED ORDER — PREDNISONE 10 MG PO TABS
10.0000 mg | ORAL_TABLET | Freq: Two times a day (BID) | ORAL | 0 refills | Status: DC
Start: 2023-02-26 — End: 2023-04-20

## 2023-02-28 ENCOUNTER — Inpatient Hospital Stay: Payer: Medicare HMO | Admitting: Nutrition

## 2023-02-28 NOTE — Progress Notes (Signed)
Telephone follow up completed with patient. Patient is currently on Xeloda.  Patient lost 9 pounds from Sept 23 to Oct 10.  Weight documented at 254 pounds Sept 23 decreased to 245 pounds Oct 10.  Labs noted: Glucose 116.  Patient states his taste has improved over the last few days and he is eating more. He usually eats 3 meals and drinks 2-3 bottles of Ensure Plus or equivalent. Denies mouth sores. Tolerates egg McMuffins and "uncrustables". He is not enthusiastic about "gaining" weight. He has no questions or concerns.  Nutrition Diagnosis:  Unintended wt loss continues.  Intervention: Increase high calorie foods at meals and snacks. Drink 3 bottles of Ensure Plus or equivalent daily. Strive for weight maintenance. Continue baking soda and salt water gargles. Reviewed recipe.  Monitoring, Evaluation, Goals: Increase calories and protein to minimize further wt loss.  Will follow as needed. Encouraged patient to reach out with questions or concerns.

## 2023-03-02 ENCOUNTER — Encounter: Payer: Self-pay | Admitting: Oncology

## 2023-03-02 ENCOUNTER — Other Ambulatory Visit: Payer: Self-pay | Admitting: Oncology

## 2023-03-02 ENCOUNTER — Other Ambulatory Visit: Payer: Self-pay

## 2023-03-02 DIAGNOSIS — C168 Malignant neoplasm of overlapping sites of stomach: Secondary | ICD-10-CM

## 2023-03-02 MED ORDER — CAPECITABINE 500 MG PO TABS
ORAL_TABLET | ORAL | 0 refills | Status: DC
  Filled 2023-03-02: qty 70, 21d supply, fill #0

## 2023-03-02 NOTE — Progress Notes (Signed)
Specialty Pharmacy Refill Coordination Note  Todd Harrison is a 77 y.o. male contacted today regarding refills of specialty medication(s) Capecitabine   Patient requested Delivery   Delivery date: 03/14/23   Verified address: [address]  [name] [address]   Medication will be filled on 03/13/23 pending a refill request.

## 2023-03-05 ENCOUNTER — Other Ambulatory Visit: Payer: Self-pay

## 2023-03-05 NOTE — Progress Notes (Signed)
Patient called pharmacy requesting an earlier delivery date. Medication will be filled 03/06/23 and delivered 03/07/23 to Verified address: [address]  [place] [address]

## 2023-03-06 ENCOUNTER — Encounter: Payer: Self-pay | Admitting: Oncology

## 2023-03-06 ENCOUNTER — Other Ambulatory Visit: Payer: Self-pay

## 2023-03-07 ENCOUNTER — Other Ambulatory Visit: Payer: Self-pay

## 2023-03-08 ENCOUNTER — Inpatient Hospital Stay: Payer: Medicare HMO

## 2023-03-08 ENCOUNTER — Inpatient Hospital Stay: Payer: Medicare HMO | Admitting: Oncology

## 2023-03-08 VITALS — BP 119/68 | HR 86 | Temp 98.2°F | Resp 18 | Ht 72.0 in | Wt 243.5 lb

## 2023-03-08 DIAGNOSIS — C169 Malignant neoplasm of stomach, unspecified: Secondary | ICD-10-CM | POA: Diagnosis not present

## 2023-03-08 DIAGNOSIS — C168 Malignant neoplasm of overlapping sites of stomach: Secondary | ICD-10-CM

## 2023-03-08 DIAGNOSIS — Z95828 Presence of other vascular implants and grafts: Secondary | ICD-10-CM

## 2023-03-08 LAB — CBC WITH DIFFERENTIAL (CANCER CENTER ONLY)
Abs Immature Granulocytes: 0.06 10*3/uL (ref 0.00–0.07)
Basophils Absolute: 0.1 10*3/uL (ref 0.0–0.1)
Basophils Relative: 1 %
Eosinophils Absolute: 0.3 10*3/uL (ref 0.0–0.5)
Eosinophils Relative: 4 %
HCT: 35.7 % — ABNORMAL LOW (ref 39.0–52.0)
Hemoglobin: 12.2 g/dL — ABNORMAL LOW (ref 13.0–17.0)
Immature Granulocytes: 1 %
Lymphocytes Relative: 22 %
Lymphs Abs: 2 10*3/uL (ref 0.7–4.0)
MCH: 34.3 pg — ABNORMAL HIGH (ref 26.0–34.0)
MCHC: 34.2 g/dL (ref 30.0–36.0)
MCV: 100.3 fL — ABNORMAL HIGH (ref 80.0–100.0)
Monocytes Absolute: 1 10*3/uL (ref 0.1–1.0)
Monocytes Relative: 11 %
Neutro Abs: 5.8 10*3/uL (ref 1.7–7.7)
Neutrophils Relative %: 61 %
Platelet Count: 187 10*3/uL (ref 150–400)
RBC: 3.56 MIL/uL — ABNORMAL LOW (ref 4.22–5.81)
RDW: 17.2 % — ABNORMAL HIGH (ref 11.5–15.5)
WBC Count: 9.2 10*3/uL (ref 4.0–10.5)
nRBC: 0 % (ref 0.0–0.2)

## 2023-03-08 LAB — CMP (CANCER CENTER ONLY)
ALT: 8 U/L (ref 0–44)
AST: 10 U/L — ABNORMAL LOW (ref 15–41)
Albumin: 3.8 g/dL (ref 3.5–5.0)
Alkaline Phosphatase: 56 U/L (ref 38–126)
Anion gap: 6 (ref 5–15)
BUN: 18 mg/dL (ref 8–23)
CO2: 26 mmol/L (ref 22–32)
Calcium: 9.3 mg/dL (ref 8.9–10.3)
Chloride: 106 mmol/L (ref 98–111)
Creatinine: 1 mg/dL (ref 0.61–1.24)
GFR, Estimated: >60 mL/min (ref >60)
Glucose, Bld: 109 mg/dL — ABNORMAL HIGH (ref 70–99)
Potassium: 3.7 mmol/L (ref 3.5–5.1)
Sodium: 138 mmol/L (ref 135–145)
Total Bilirubin: 0.7 mg/dL (ref 0.3–1.2)
Total Protein: 6.1 g/dL — ABNORMAL LOW (ref 6.5–8.1)

## 2023-03-08 MED ORDER — SODIUM CHLORIDE 0.9% FLUSH
10.0000 mL | Freq: Once | INTRAVENOUS | Status: AC
  Administered 2023-03-08: 10 mL via INTRAVENOUS

## 2023-03-08 MED ORDER — HEPARIN SOD (PORK) LOCK FLUSH 100 UNIT/ML IV SOLN
500.0000 [IU] | Freq: Once | INTRAVENOUS | Status: AC
  Administered 2023-03-08: 500 [IU] via INTRAVENOUS

## 2023-03-08 NOTE — Progress Notes (Signed)
[facility] OFFICE PROGRESS NOTE   Diagnosis: Gastric cancer  INTERVAL HISTORY:   Mr. Jani completed another cycle of Xeloda beginning 02/19/2023.  No mouth sores, nausea, or hand/foot pain.  The skin rash improved when he took 4 days of prednisone.  Back pain has improved.  He continues to have altered taste.  Objective:  Vital signs in last 24 hours:  Blood pressure 119/68, pulse 86, temperature 98.2 F (36.8 C), temperature source Oral, resp. rate 18, height 6' (1.829 m), weight 243 lb 8 oz (110.5 kg), SpO2 97%.    HEENT: No thrush or ulcers Resp: Decreased breath sounds at the right posterior base, no respiratory distress Cardio: Regular rate and rhythm GI: Soft and nontender, no mass Vascular: No leg edema Skin: Mild hyperpigmentation of the hands, hyperpigmentation of the soles   Portacath/PICC-without erythema  Lab Results:  Lab Results  Component Value Date   WBC 9.2 03/08/2023   HGB 12.2 (L) 03/08/2023   HCT 35.7 (L) 03/08/2023   MCV 100.3 (H) 03/08/2023   PLT 187 03/08/2023   NEUTROABS 5.8 03/08/2023    CMP  Lab Results  Component Value Date   NA 138 02/15/2023   K 4.3 02/15/2023   CL 105 02/15/2023   CO2 26 02/15/2023   GLUCOSE 116 (H) 02/15/2023   BUN 20 02/15/2023   CREATININE 1.00 02/15/2023   CALCIUM 9.3 02/15/2023   PROT 6.3 (L) 02/15/2023   ALBUMIN 3.7 02/15/2023   AST 12 (L) 02/15/2023   ALT 7 02/15/2023   ALKPHOS 59 02/15/2023   BILITOT 0.6 02/15/2023   GFRNONAA >60 02/15/2023   GFRAA 74 05/20/2020    Lab Results  Component Value Date   CEA1 <0.6 04/13/2022    Medications: I have reviewed the patient's current medications.   Assessment/Plan: Gastric cancer with CT evidence of abdominal carcinomatosis CT abdomen/pelvis 04/13/2022-ascites, diffuse omental and peritoneal nodularity, possible circumferential mass in the transverse colon, new small left greater than right pleural effusions CT abdominal mass biopsy  04/24/2022-metastatic poorly differentiated adenocarcinoma with very focal signet ring cell features CT paracentesis 04/24/2022-no malignant cells identified Colonoscopy 04/25/2022-diverticulosis in the sigmoid colon.  Internal hemorrhoids.  No specimens collected. Upper endoscopy 04/25/2022-large infiltrative mass with oozing bleeding and stigmata of recent bleeding in the gastric fundus, on the anterior wall of the gastric body and on the greater curvature of the gastric body; deformity in the gastric antrum.  Extrinsic deformity in the entire duodenum.  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 Her2 by IHC negative, 1+; MMR IHC normal; PD-L1 CPS 1%, positive.  Cycle 1 FOLFOX 05/04/2022 Cycle 2 FOLFOX 05/17/2022 Cycle 3 FOLFOX 05/31/2022-oxaliplatin dose reduced secondary to prolonged cold sensitivity Cycle 4 FOLFOX 06/14/2022 Cycle 5 FOLFOX 06/28/2022 CT abdomen/pelvis 07/08/2022-asymmetric wall thickening at the lesser curvature of the stomach, decreased ascites, increased diffuse omental and peritoneal nodularity Cycle 6 FOLFOX 07/12/2022 Cycle 7 FOLFOX 08/09/2022 Cycle 8 FOLFOX 08/23/2022 Cycle 9 FOLFOX 09/06/2022 CTs 09/11/2022-stable, no new or progressive interval findings Cycle 10 FOLFOX 09/20/2022 Xeloda maintenance 2 weeks on/1 week off 10/16/2022 Cycle 2 Xeloda 11/06/2022-Xeloda dose reduced secondary to early foot skin toxicity Cycle 3 Xeloda 11/27/2022 Cycle 4 Xeloda 12/18/2022 CTs 01/02/2023-wall thickening of the mid gastric body-possibly progressive, unchanged peritoneal/omental caking, unchanged L1 compression fracture Cycle 5 Xeloda beginning 01/08/2023 Cycle 6 Xeloda beginning 01/29/2023 Cycle 7 Xeloda beginning 02/19/2023 Cycle 8 Xeloda beginning 03/12/2023 Truncal rash-status post recent punch biopsy with pathology pending; biopsy reported negative per family  04/26/2022 Lichen planus of the feet Hypothyroidism Atrial fibrillation Admission  07/24/2022 with acute bilateral pulmonary embolism/right heart strain Heparin anticoagulation 07/24/2022 Doppler 07/24/2022-acute DVT of the right femoral, popliteal, posterior tibial, and peroneal veins, acute left posterior tibial DVT 7.  Anemia secondary to chemotherapy, phlebotomy, and hemoptysis-improved 8.  L1 compression fracture on plain x-ray 12/18/2022 MRI lumbar spine 02/02/2023-acute/subacute L1 compression fracture, mild degenerative change of the lumbar spine     Disposition: Todd Harrison appears stable.  He is tolerating the Xeloda well.  There is no clinical evidence for progression of the gastric cancer.  He will try diclofenac gel if the feet become painful.  He will return for an office and lab visit in 3 weeks.  Thornton Papas, MD  03/08/2023  8:17 AM

## 2023-03-08 NOTE — Patient Instructions (Signed)

## 2023-03-15 ENCOUNTER — Other Ambulatory Visit (HOSPITAL_COMMUNITY): Payer: Self-pay

## 2023-03-20 ENCOUNTER — Other Ambulatory Visit: Payer: Self-pay

## 2023-03-22 ENCOUNTER — Other Ambulatory Visit: Payer: Self-pay | Admitting: Nurse Practitioner

## 2023-03-22 DIAGNOSIS — J441 Chronic obstructive pulmonary disease with (acute) exacerbation: Secondary | ICD-10-CM

## 2023-03-23 ENCOUNTER — Other Ambulatory Visit: Payer: Self-pay

## 2023-03-26 ENCOUNTER — Other Ambulatory Visit (HOSPITAL_COMMUNITY): Payer: Self-pay

## 2023-03-26 ENCOUNTER — Other Ambulatory Visit: Payer: Self-pay

## 2023-03-26 ENCOUNTER — Encounter: Payer: Self-pay | Admitting: Oncology

## 2023-03-26 ENCOUNTER — Other Ambulatory Visit: Payer: Self-pay | Admitting: Oncology

## 2023-03-26 DIAGNOSIS — C168 Malignant neoplasm of overlapping sites of stomach: Secondary | ICD-10-CM

## 2023-03-26 MED ORDER — CAPECITABINE 500 MG PO TABS
ORAL_TABLET | ORAL | 0 refills | Status: DC
  Filled 2023-03-26: qty 70, 21d supply, fill #0

## 2023-03-26 NOTE — Progress Notes (Signed)
Specialty Pharmacy Refill Coordination Note  Todd Harrison is a 77 y.o. male contacted today regarding refills of specialty medication(s) Capecitabine   Patient requested Delivery   Delivery date: 04/02/23   Verified address: [address]  [name] [address]   Medication will be filled on 03/30/23.

## 2023-03-30 ENCOUNTER — Inpatient Hospital Stay: Payer: Medicare HMO | Attending: Nurse Practitioner

## 2023-03-30 ENCOUNTER — Other Ambulatory Visit: Payer: Self-pay

## 2023-03-30 ENCOUNTER — Inpatient Hospital Stay: Payer: Medicare HMO | Admitting: Oncology

## 2023-03-30 ENCOUNTER — Inpatient Hospital Stay: Payer: Medicare HMO

## 2023-03-30 ENCOUNTER — Encounter: Payer: Self-pay | Admitting: Oncology

## 2023-03-30 VITALS — BP 104/72 | HR 76 | Temp 98.1°F | Resp 18 | Ht 72.0 in | Wt 247.2 lb

## 2023-03-30 DIAGNOSIS — C168 Malignant neoplasm of overlapping sites of stomach: Secondary | ICD-10-CM | POA: Diagnosis not present

## 2023-03-30 DIAGNOSIS — L439 Lichen planus, unspecified: Secondary | ICD-10-CM | POA: Diagnosis not present

## 2023-03-30 DIAGNOSIS — D6481 Anemia due to antineoplastic chemotherapy: Secondary | ICD-10-CM | POA: Diagnosis not present

## 2023-03-30 DIAGNOSIS — Z86711 Personal history of pulmonary embolism: Secondary | ICD-10-CM | POA: Insufficient documentation

## 2023-03-30 DIAGNOSIS — M4856XA Collapsed vertebra, not elsewhere classified, lumbar region, initial encounter for fracture: Secondary | ICD-10-CM | POA: Diagnosis not present

## 2023-03-30 DIAGNOSIS — I4891 Unspecified atrial fibrillation: Secondary | ICD-10-CM | POA: Insufficient documentation

## 2023-03-30 DIAGNOSIS — E039 Hypothyroidism, unspecified: Secondary | ICD-10-CM | POA: Diagnosis not present

## 2023-03-30 DIAGNOSIS — Z86718 Personal history of other venous thrombosis and embolism: Secondary | ICD-10-CM | POA: Diagnosis not present

## 2023-03-30 DIAGNOSIS — C169 Malignant neoplasm of stomach, unspecified: Secondary | ICD-10-CM | POA: Insufficient documentation

## 2023-03-30 LAB — CMP (CANCER CENTER ONLY)
ALT: 8 U/L (ref 0–44)
AST: 11 U/L — ABNORMAL LOW (ref 15–41)
Albumin: 3.6 g/dL (ref 3.5–5.0)
Alkaline Phosphatase: 45 U/L (ref 38–126)
Anion gap: 6 (ref 5–15)
BUN: 19 mg/dL (ref 8–23)
CO2: 26 mmol/L (ref 22–32)
Calcium: 8.8 mg/dL — ABNORMAL LOW (ref 8.9–10.3)
Chloride: 106 mmol/L (ref 98–111)
Creatinine: 0.95 mg/dL (ref 0.61–1.24)
GFR, Estimated: >60 mL/min (ref >60)
Glucose, Bld: 95 mg/dL (ref 70–99)
Potassium: 3.8 mmol/L (ref 3.5–5.1)
Sodium: 138 mmol/L (ref 135–145)
Total Bilirubin: 0.5 mg/dL (ref <1.2)
Total Protein: 6.3 g/dL — ABNORMAL LOW (ref 6.5–8.1)

## 2023-03-30 LAB — CBC WITH DIFFERENTIAL (CANCER CENTER ONLY)
Abs Immature Granulocytes: 0.03 10*3/uL (ref 0.00–0.07)
Basophils Absolute: 0.1 10*3/uL (ref 0.0–0.1)
Basophils Relative: 1 %
Eosinophils Absolute: 0.3 10*3/uL (ref 0.0–0.5)
Eosinophils Relative: 4 %
HCT: 34.5 % — ABNORMAL LOW (ref 39.0–52.0)
Hemoglobin: 11.9 g/dL — ABNORMAL LOW (ref 13.0–17.0)
Immature Granulocytes: 0 %
Lymphocytes Relative: 22 %
Lymphs Abs: 1.8 10*3/uL (ref 0.7–4.0)
MCH: 34.5 pg — ABNORMAL HIGH (ref 26.0–34.0)
MCHC: 34.5 g/dL (ref 30.0–36.0)
MCV: 100 fL (ref 80.0–100.0)
Monocytes Absolute: 0.8 10*3/uL (ref 0.1–1.0)
Monocytes Relative: 10 %
Neutro Abs: 5 10*3/uL (ref 1.7–7.7)
Neutrophils Relative %: 63 %
Platelet Count: 188 10*3/uL (ref 150–400)
RBC: 3.45 MIL/uL — ABNORMAL LOW (ref 4.22–5.81)
RDW: 17.6 % — ABNORMAL HIGH (ref 11.5–15.5)
WBC Count: 8 10*3/uL (ref 4.0–10.5)
nRBC: 0 % (ref 0.0–0.2)

## 2023-03-30 NOTE — Progress Notes (Signed)
[facility] OFFICE PROGRESS NOTE   Diagnosis: Gastric cancer  INTERVAL HISTORY:   Todd Harrison returns as scheduled.  He completed another cycle of Xeloda beginning 03/12/2023.  No mouth sores or diarrhea.  No change in the callus formation at the soles.  He has pruritus over the trunk.  He reports altered taste.  Objective:  Vital signs in last 24 hours:  Blood pressure 104/72, pulse 76, temperature 98.1 F (36.7 C), temperature source Temporal, resp. rate 18, height 6' (1.829 m), weight 247 lb 3.2 oz (112.1 kg), SpO2 96%.    HEENT: No thrush or ulcers, the mouth is dry Resp: Lungs clear bilaterally Cardio: Regular rate and rhythm GI: No hepatosplenomegaly, nontender, no mass Vascular: Trace edema at the right lower leg  Skin: Over the trunk, mild confluent erythema at the right upper anterior lateral chest, palms without erythema, skin thickening with dryness and callus formation at the soles  Portacath/PICC-without erythema  Lab Results:  Lab Results  Component Value Date   WBC 8.0 03/30/2023   HGB 11.9 (L) 03/30/2023   HCT 34.5 (L) 03/30/2023   MCV 100.0 03/30/2023   PLT 188 03/30/2023   NEUTROABS 5.0 03/30/2023    CMP  Lab Results  Component Value Date   NA 138 03/08/2023   K 3.7 03/08/2023   CL 106 03/08/2023   CO2 26 03/08/2023   GLUCOSE 109 (H) 03/08/2023   BUN 18 03/08/2023   CREATININE 1.00 03/08/2023   CALCIUM 9.3 03/08/2023   PROT 6.1 (L) 03/08/2023   ALBUMIN 3.8 03/08/2023   AST 10 (L) 03/08/2023   ALT 8 03/08/2023   ALKPHOS 56 03/08/2023   BILITOT 0.7 03/08/2023   GFRNONAA >60 03/08/2023   GFRAA 74 05/20/2020    Lab Results  Component Value Date   CEA1 <0.6 04/13/2022     Medications: I have reviewed the patient's current medications.   Assessment/Plan: Gastric cancer with CT evidence of abdominal carcinomatosis CT abdomen/pelvis 04/13/2022-ascites, diffuse omental and peritoneal nodularity, possible circumferential mass  in the transverse colon, new small left greater than right pleural effusions CT abdominal mass biopsy 04/24/2022-metastatic poorly differentiated adenocarcinoma with very focal signet ring cell features CT paracentesis 04/24/2022-no malignant cells identified Colonoscopy 04/25/2022-diverticulosis in the sigmoid colon.  Internal hemorrhoids.  No specimens collected. Upper endoscopy 04/25/2022-large infiltrative mass with oozing bleeding and stigmata of recent bleeding in the gastric fundus, on the anterior wall of the gastric body and on the greater curvature of the gastric body; deformity in the gastric antrum.  Extrinsic deformity in the entire duodenum.  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 Her2 by IHC negative, 1+; MMR IHC normal; PD-L1 CPS 1%, positive.  Cycle 1 FOLFOX 05/04/2022 Cycle 2 FOLFOX 05/17/2022 Cycle 3 FOLFOX 05/31/2022-oxaliplatin dose reduced secondary to prolonged cold sensitivity Cycle 4 FOLFOX 06/14/2022 Cycle 5 FOLFOX 06/28/2022 CT abdomen/pelvis 07/08/2022-asymmetric wall thickening at the lesser curvature of the stomach, decreased ascites, increased diffuse omental and peritoneal nodularity Cycle 6 FOLFOX 07/12/2022 Cycle 7 FOLFOX 08/09/2022 Cycle 8 FOLFOX 08/23/2022 Cycle 9 FOLFOX 09/06/2022 CTs 09/11/2022-stable, no new or progressive interval findings Cycle 10 FOLFOX 09/20/2022 Xeloda maintenance 2 weeks on/1 week off 10/16/2022 Cycle 2 Xeloda 11/06/2022-Xeloda dose reduced secondary to early foot skin toxicity Cycle 3 Xeloda 11/27/2022 Cycle 4 Xeloda 12/18/2022 CTs 01/02/2023-wall thickening of the mid gastric body-possibly progressive, unchanged peritoneal/omental caking, unchanged L1 compression fracture Cycle 5 Xeloda beginning 01/08/2023 Cycle 6 Xeloda beginning 01/29/2023 Cycle 7 Xeloda beginning 02/19/2023 Cycle  8 Xeloda beginning 03/12/2023 Cycle 9 Xeloda beginning 04/09/2023 Truncal rash-status post recent punch biopsy with  pathology pending; biopsy reported negative per family 04/26/2022 Lichen planus of the feet Hypothyroidism Atrial fibrillation Admission 07/24/2022 with acute bilateral pulmonary embolism/right heart strain Heparin anticoagulation 07/24/2022 Doppler 07/24/2022-acute DVT of the right femoral, popliteal, posterior tibial, and peroneal veins, acute left posterior tibial DVT 7.  Anemia secondary to chemotherapy, phlebotomy, and hemoptysis-improved 8.  L1 compression fracture on plain x-ray 12/18/2022 MRI lumbar spine 02/02/2023-acute/subacute L1 compression fracture, mild degenerative change of the lumbar spine      Disposition: Todd Harrison appears stable.  There is no clinical evidence for progression of the gastric cancer.  He will continue Xeloda maintenance.  He would like to delay the next cycle until after the Thanksgiving holiday.  He will begin the neck cycle of Xeloda 04/09/2023.  He will return for an office and lab visit on 05/04/2023.  We will plan for a restaging CT in January.  He plans to follow-up with his dermatology to evaluate the pruritus and skin rash.  Thornton Papas, MD  03/30/2023  8:41 AM

## 2023-04-17 ENCOUNTER — Other Ambulatory Visit: Payer: Self-pay | Admitting: Oncology

## 2023-04-17 ENCOUNTER — Encounter: Payer: Self-pay | Admitting: Oncology

## 2023-04-17 ENCOUNTER — Other Ambulatory Visit: Payer: Self-pay

## 2023-04-17 DIAGNOSIS — C168 Malignant neoplasm of overlapping sites of stomach: Secondary | ICD-10-CM

## 2023-04-17 MED ORDER — CAPECITABINE 500 MG PO TABS
ORAL_TABLET | ORAL | 0 refills | Status: DC
  Filled 2023-04-17: qty 70, 21d supply, fill #0

## 2023-04-17 NOTE — Progress Notes (Signed)
Specialty Pharmacy Refill Coordination Note  Todd Harrison is a 77 y.o. male contacted today regarding refills of specialty medication(s) Capecitabine   Patient requested Delivery   Delivery date: 04/24/23   Verified address: [address] [place] [address]   Medication will be filled on 04/23/23.   *pending refill request-- call if any delays*

## 2023-04-20 ENCOUNTER — Telehealth: Payer: Self-pay | Admitting: *Deleted

## 2023-04-20 ENCOUNTER — Encounter: Payer: Self-pay | Admitting: Oncology

## 2023-04-20 MED ORDER — METHYLPREDNISOLONE 4 MG PO TBPK: ORAL_TABLET | Freq: Every day | ORAL | 0 refills | Status: DC

## 2023-04-20 NOTE — Telephone Encounter (Signed)
Mr. Bencivenga reports his pruritic rash has returned. Started 1 week ago and is progressing. Covers his back and extending toward abdomen. Not on arms/legs at this time. Asking for steroid to get him through Christmas. Sees dermatologist next week. Per Dr. Truett Perna: OK to start Medrol 4 mg dose pack for 6 days. Be sure to take photos of rash to show dermatology at appointment.

## 2023-04-23 ENCOUNTER — Other Ambulatory Visit: Payer: Self-pay

## 2023-04-23 ENCOUNTER — Encounter: Payer: Self-pay | Admitting: Oncology

## 2023-04-24 ENCOUNTER — Other Ambulatory Visit (HOSPITAL_COMMUNITY): Payer: Self-pay

## 2023-05-04 ENCOUNTER — Inpatient Hospital Stay: Payer: Medicare HMO | Admitting: Nurse Practitioner

## 2023-05-04 ENCOUNTER — Other Ambulatory Visit: Payer: Self-pay | Admitting: *Deleted

## 2023-05-04 ENCOUNTER — Other Ambulatory Visit (HOSPITAL_BASED_OUTPATIENT_CLINIC_OR_DEPARTMENT_OTHER): Payer: Self-pay

## 2023-05-04 ENCOUNTER — Encounter: Payer: Self-pay | Admitting: Nurse Practitioner

## 2023-05-04 ENCOUNTER — Encounter: Payer: Self-pay | Admitting: Oncology

## 2023-05-04 ENCOUNTER — Other Ambulatory Visit: Payer: Medicare HMO

## 2023-05-04 ENCOUNTER — Inpatient Hospital Stay: Payer: Medicare HMO

## 2023-05-04 ENCOUNTER — Inpatient Hospital Stay: Payer: Medicare HMO | Attending: Nurse Practitioner

## 2023-05-04 VITALS — BP 104/67 | HR 73 | Temp 98.2°F | Resp 18 | Ht 72.0 in | Wt 249.3 lb

## 2023-05-04 DIAGNOSIS — R21 Rash and other nonspecific skin eruption: Secondary | ICD-10-CM | POA: Insufficient documentation

## 2023-05-04 DIAGNOSIS — M4856XA Collapsed vertebra, not elsewhere classified, lumbar region, initial encounter for fracture: Secondary | ICD-10-CM | POA: Insufficient documentation

## 2023-05-04 DIAGNOSIS — E039 Hypothyroidism, unspecified: Secondary | ICD-10-CM | POA: Diagnosis not present

## 2023-05-04 DIAGNOSIS — Z86711 Personal history of pulmonary embolism: Secondary | ICD-10-CM | POA: Diagnosis not present

## 2023-05-04 DIAGNOSIS — I4891 Unspecified atrial fibrillation: Secondary | ICD-10-CM | POA: Insufficient documentation

## 2023-05-04 DIAGNOSIS — L439 Lichen planus, unspecified: Secondary | ICD-10-CM | POA: Insufficient documentation

## 2023-05-04 DIAGNOSIS — C168 Malignant neoplasm of overlapping sites of stomach: Secondary | ICD-10-CM

## 2023-05-04 DIAGNOSIS — Z86718 Personal history of other venous thrombosis and embolism: Secondary | ICD-10-CM | POA: Insufficient documentation

## 2023-05-04 DIAGNOSIS — C169 Malignant neoplasm of stomach, unspecified: Secondary | ICD-10-CM | POA: Diagnosis present

## 2023-05-04 DIAGNOSIS — D6481 Anemia due to antineoplastic chemotherapy: Secondary | ICD-10-CM | POA: Diagnosis not present

## 2023-05-04 LAB — CMP (CANCER CENTER ONLY)
ALT: 8 U/L (ref 0–44)
AST: 8 U/L — ABNORMAL LOW (ref 15–41)
Albumin: 3.7 g/dL (ref 3.5–5.0)
Alkaline Phosphatase: 56 U/L (ref 38–126)
Anion gap: 7 (ref 5–15)
BUN: 15 mg/dL (ref 8–23)
CO2: 26 mmol/L (ref 22–32)
Calcium: 8.9 mg/dL (ref 8.9–10.3)
Chloride: 107 mmol/L (ref 98–111)
Creatinine: 1.06 mg/dL (ref 0.61–1.24)
GFR, Estimated: >60 mL/min (ref >60)
Glucose, Bld: 115 mg/dL — ABNORMAL HIGH (ref 70–99)
Potassium: 4 mmol/L (ref 3.5–5.1)
Sodium: 140 mmol/L (ref 135–145)
Total Bilirubin: 0.6 mg/dL (ref <1.2)
Total Protein: 6.1 g/dL — ABNORMAL LOW (ref 6.5–8.1)

## 2023-05-04 LAB — CBC WITH DIFFERENTIAL (CANCER CENTER ONLY)
Abs Immature Granulocytes: 0.04 10*3/uL (ref 0.00–0.07)
Basophils Absolute: 0 10*3/uL (ref 0.0–0.1)
Basophils Relative: 0 %
Eosinophils Absolute: 0.2 10*3/uL (ref 0.0–0.5)
Eosinophils Relative: 3 %
HCT: 35.8 % — ABNORMAL LOW (ref 39.0–52.0)
Hemoglobin: 12.2 g/dL — ABNORMAL LOW (ref 13.0–17.0)
Immature Granulocytes: 1 %
Lymphocytes Relative: 13 %
Lymphs Abs: 1.1 10*3/uL (ref 0.7–4.0)
MCH: 32.4 pg (ref 26.0–34.0)
MCHC: 34.1 g/dL (ref 30.0–36.0)
MCV: 95.2 fL (ref 80.0–100.0)
Monocytes Absolute: 0.7 10*3/uL (ref 0.1–1.0)
Monocytes Relative: 8 %
Neutro Abs: 6.3 10*3/uL (ref 1.7–7.7)
Neutrophils Relative %: 75 %
Platelet Count: 157 10*3/uL (ref 150–400)
RBC: 3.76 MIL/uL — ABNORMAL LOW (ref 4.22–5.81)
RDW: 16.2 % — ABNORMAL HIGH (ref 11.5–15.5)
WBC Count: 8.4 10*3/uL (ref 4.0–10.5)
nRBC: 0 % (ref 0.0–0.2)

## 2023-05-04 MED ORDER — PROCHLORPERAZINE MALEATE 10 MG PO TABS: 10.0000 mg | ORAL_TABLET | Freq: Four times a day (QID) | ORAL | 1 refills | Status: DC | PRN

## 2023-05-04 MED ORDER — NYSTATIN 100000 UNIT/ML MT SUSP
5.0000 mL | Freq: Four times a day (QID) | OROMUCOSAL | 1 refills | Status: DC | PRN
  Filled 2023-05-04: qty 240, 12d supply, fill #0

## 2023-05-04 NOTE — Progress Notes (Signed)
[facility] OFFICE PROGRESS NOTE   Diagnosis: Gastric cancer  INTERVAL HISTORY:   Mr. Cusmano returns as scheduled.  He completed another cycle of Xeloda beginning 04/09/2023.  He denies nausea/vomiting.  He noted some tongue/mouth soreness.  No definite ulcers.  The discomfort has resolved.  No redness or pain on the palms.  Soles are intermittently sore.  He thinks this may be due to lichen planus.  Objective:  Vital signs in last 24 hours:  Blood pressure 104/67, pulse 73, temperature 98.2 F (36.8 C), temperature source Temporal, resp. rate 18, height 6' (1.829 m), weight 249 lb 4.8 oz (113.1 kg), SpO2 98%.    HEENT: No thrush or ulcers. Resp: Lungs clear bilaterally. Cardio: Regular rate and rhythm. GI: Abdomen soft and nontender.  No hepatosplenomegaly. Vascular: Trace edema right lower leg. Skin: Palms without erythema.  Soles with skin thickening, dryness, callus formation. Port-A-Cath without erythema.  Lab Results:  Lab Results  Component Value Date   WBC 8.4 05/04/2023   HGB 12.2 (L) 05/04/2023   HCT 35.8 (L) 05/04/2023   MCV 95.2 05/04/2023   PLT 157 05/04/2023   NEUTROABS 6.3 05/04/2023    Imaging:  No results found.  Medications: I have reviewed the patient's current medications.  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  Internal hemorrhoids.  No specimens collected. Upper endoscopy 04/25/2022-large infiltrative mass with oozing bleeding and stigmata of recent bleeding in the  gastric fundus, on the anterior wall of the gastric body and on the greater curvature of the gastric body; deformity in the gastric antrum.  Extrinsic deformity in the entire duodenum.  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 Her2 by IHC negative, 1+; MMR IHC normal; PD-L1 CPS 1%, positive.  Cycle 1 FOLFOX 05/04/2022 Cycle 2 FOLFOX 05/17/2022 Cycle 3 FOLFOX 05/31/2022-oxaliplatin dose reduced secondary to prolonged cold sensitivity Cycle 4 FOLFOX 06/14/2022 Cycle 5 FOLFOX 06/28/2022 CT abdomen/pelvis 07/08/2022-asymmetric wall thickening at the lesser curvature of the stomach, decreased ascites, increased diffuse omental and peritoneal nodularity Cycle 6 FOLFOX 07/12/2022 Cycle 7 FOLFOX 08/09/2022 Cycle 8 FOLFOX 08/23/2022 Cycle 9 FOLFOX 09/06/2022 CTs 09/11/2022-stable, no new or progressive interval findings Cycle 10 FOLFOX 09/20/2022 Xeloda maintenance 2 weeks on/1 week off 10/16/2022 Cycle 2 Xeloda 11/06/2022-Xeloda dose reduced secondary to early foot skin toxicity Cycle 3 Xeloda 11/27/2022 Cycle 4 Xeloda 12/18/2022 CTs 01/02/2023-wall thickening of the mid gastric body-possibly progressive, unchanged peritoneal/omental caking, unchanged L1 compression fracture Cycle 5 Xeloda beginning 01/08/2023 Cycle 6 Xeloda beginning 01/29/2023 Cycle 7 Xeloda beginning 02/19/2023 Cycle 8 Xeloda beginning 03/12/2023 Cycle 9 Xeloda beginning 04/09/2023 Cycle 10 Xeloda beginning 05/07/2023 Truncal rash-status post recent punch biopsy with pathology pending; biopsy reported negative per family 04/26/2022 Lichen planus of the feet Hypothyroidism Atrial fibrillation Admission 07/24/2022 with acute bilateral pulmonary embolism/right heart strain Heparin anticoagulation 07/24/2022 Doppler 07/24/2022-acute DVT of the right femoral, popliteal, posterior tibial, and peroneal veins, acute left posterior tibial DVT 7.  Anemia secondary to chemotherapy, phlebotomy, and  hemoptysis-improved 8.  L1 compression fracture on plain x-ray 12/18/2022 MRI lumbar spine 02/02/2023-acute/subacute L1 compression fracture, mild degenerative change of the lumbar spine    Disposition: Mr. Posadas appears stable.  There  is no clinical evidence for progression of the gastric cancer.  He will continue Xeloda maintenance, begins the next cycle 05/07/2023.  Plan for restaging CTs prior to his next office visit.  CBC and chemistry panel reviewed.  Labs adequate to proceed as above.  Soles with changes suggestive of hand-foot syndrome.  He reports similar skin changes with lichen planus in the past.  Continue to monitor.  He will return for follow-up in 3 weeks.  We are available to see him sooner if needed.   Lonna Cobb ANP/GNP-BC   05/04/2023  2:10 PM

## 2023-05-10 ENCOUNTER — Other Ambulatory Visit: Payer: Self-pay | Admitting: *Deleted

## 2023-05-10 ENCOUNTER — Other Ambulatory Visit (HOSPITAL_COMMUNITY): Payer: Self-pay

## 2023-05-10 MED ORDER — METHYLPREDNISOLONE 4 MG PO TBPK: ORAL_TABLET | Freq: Every day | ORAL | 0 refills | Status: DC

## 2023-05-12 ENCOUNTER — Other Ambulatory Visit: Payer: Self-pay | Admitting: Oncology

## 2023-05-14 ENCOUNTER — Encounter: Payer: Self-pay | Admitting: Oncology

## 2023-05-16 ENCOUNTER — Other Ambulatory Visit: Payer: Self-pay

## 2023-05-16 ENCOUNTER — Other Ambulatory Visit: Payer: Self-pay | Admitting: Oncology

## 2023-05-16 DIAGNOSIS — C168 Malignant neoplasm of overlapping sites of stomach: Secondary | ICD-10-CM

## 2023-05-16 NOTE — Progress Notes (Signed)
 Specialty Pharmacy Refill Coordination Note  Todd Harrison is a 78 y.o. male contacted today regarding refills of specialty medication(s) Capecitabine  (XELODA )   Patient requested Delivery   Delivery date: 05/23/23   Verified address: [address] [place] [address]   Medication will be filled on 05/22/23.

## 2023-05-16 NOTE — Progress Notes (Signed)
 Specialty Pharmacy Ongoing Clinical Assessment Note  Todd Harrison is a 78 y.o. male who is being followed by the specialty pharmacy service for RxSp Oncology   Patient's specialty medication(s) reviewed today: Capecitabine  (XELODA )   Missed doses in the last 4 weeks: 0   Patient/Caregiver did not have any additional questions or concerns.   Therapeutic benefit summary: Patient is achieving benefit   Adverse events/side effects summary: No adverse events/side effects   Patient's therapy is appropriate to: Continue    Goals Addressed             This Visit's Progress    Slow Disease Progression       Patient is on track. Patient will maintain adherence. Per provider note from 12/27 no clinical signs of progression and follow up scans scheduled before next visit.          Follow up:  6 months  Todd Harrison Specialty Pharmacist

## 2023-05-17 ENCOUNTER — Encounter: Payer: Self-pay | Admitting: Oncology

## 2023-05-17 ENCOUNTER — Other Ambulatory Visit (HOSPITAL_COMMUNITY): Payer: Self-pay

## 2023-05-17 MED ORDER — CAPECITABINE 500 MG PO TABS
ORAL_TABLET | ORAL | 0 refills | Status: DC
  Filled 2023-05-17: qty 70, 21d supply, fill #0

## 2023-05-18 ENCOUNTER — Other Ambulatory Visit: Payer: Self-pay

## 2023-05-18 ENCOUNTER — Other Ambulatory Visit (HOSPITAL_COMMUNITY): Payer: Self-pay

## 2023-05-21 ENCOUNTER — Ambulatory Visit (HOSPITAL_BASED_OUTPATIENT_CLINIC_OR_DEPARTMENT_OTHER)
Admission: RE | Admit: 2023-05-21 | Discharge: 2023-05-21 | Disposition: A | Discharge status: Home / Self Care | Payer: Medicare HMO | Source: Ambulatory Visit | Attending: Nurse Practitioner | Admitting: Nurse Practitioner

## 2023-05-21 DIAGNOSIS — C168 Malignant neoplasm of overlapping sites of stomach: Secondary | ICD-10-CM | POA: Insufficient documentation

## 2023-05-21 MED ORDER — IOHEXOL 300 MG/ML  SOLN
100.0000 mL | Freq: Once | INTRAMUSCULAR | Status: AC | PRN
  Administered 2023-05-21: 100 mL via INTRAVENOUS

## 2023-05-22 ENCOUNTER — Encounter: Payer: Self-pay | Admitting: Oncology

## 2023-05-22 ENCOUNTER — Other Ambulatory Visit: Payer: Self-pay

## 2023-05-25 ENCOUNTER — Inpatient Hospital Stay: Payer: Medicare HMO

## 2023-05-25 ENCOUNTER — Inpatient Hospital Stay: Payer: Medicare HMO | Attending: Nurse Practitioner

## 2023-05-25 ENCOUNTER — Inpatient Hospital Stay (HOSPITAL_BASED_OUTPATIENT_CLINIC_OR_DEPARTMENT_OTHER): Payer: Medicare HMO | Admitting: Oncology

## 2023-05-25 VITALS — BP 104/76 | HR 73 | Temp 98.1°F | Resp 18 | Ht 72.0 in | Wt 252.7 lb

## 2023-05-25 DIAGNOSIS — D6481 Anemia due to antineoplastic chemotherapy: Secondary | ICD-10-CM | POA: Diagnosis not present

## 2023-05-25 DIAGNOSIS — I4891 Unspecified atrial fibrillation: Secondary | ICD-10-CM | POA: Diagnosis not present

## 2023-05-25 DIAGNOSIS — C786 Secondary malignant neoplasm of retroperitoneum and peritoneum: Secondary | ICD-10-CM | POA: Diagnosis not present

## 2023-05-25 DIAGNOSIS — C168 Malignant neoplasm of overlapping sites of stomach: Secondary | ICD-10-CM

## 2023-05-25 DIAGNOSIS — E039 Hypothyroidism, unspecified: Secondary | ICD-10-CM | POA: Diagnosis not present

## 2023-05-25 DIAGNOSIS — C169 Malignant neoplasm of stomach, unspecified: Secondary | ICD-10-CM | POA: Diagnosis present

## 2023-05-25 DIAGNOSIS — Z86711 Personal history of pulmonary embolism: Secondary | ICD-10-CM | POA: Insufficient documentation

## 2023-05-25 DIAGNOSIS — L439 Lichen planus, unspecified: Secondary | ICD-10-CM | POA: Diagnosis not present

## 2023-05-25 DIAGNOSIS — Z86718 Personal history of other venous thrombosis and embolism: Secondary | ICD-10-CM | POA: Insufficient documentation

## 2023-05-25 DIAGNOSIS — Z95828 Presence of other vascular implants and grafts: Secondary | ICD-10-CM

## 2023-05-25 DIAGNOSIS — Z7901 Long term (current) use of anticoagulants: Secondary | ICD-10-CM | POA: Diagnosis not present

## 2023-05-25 DIAGNOSIS — Z9221 Personal history of antineoplastic chemotherapy: Secondary | ICD-10-CM | POA: Diagnosis not present

## 2023-05-25 DIAGNOSIS — R21 Rash and other nonspecific skin eruption: Secondary | ICD-10-CM | POA: Insufficient documentation

## 2023-05-25 LAB — CBC WITH DIFFERENTIAL (CANCER CENTER ONLY)
Abs Immature Granulocytes: 0.03 10*3/uL (ref 0.00–0.07)
Basophils Absolute: 0.1 10*3/uL (ref 0.0–0.1)
Basophils Relative: 1 %
Eosinophils Absolute: 0.3 10*3/uL (ref 0.0–0.5)
Eosinophils Relative: 3 %
HCT: 36.4 % — ABNORMAL LOW (ref 39.0–52.0)
Hemoglobin: 12.4 g/dL — ABNORMAL LOW (ref 13.0–17.0)
Immature Granulocytes: 0 %
Lymphocytes Relative: 21 %
Lymphs Abs: 1.9 10*3/uL (ref 0.7–4.0)
MCH: 31.6 pg (ref 26.0–34.0)
MCHC: 34.1 g/dL (ref 30.0–36.0)
MCV: 92.6 fL (ref 80.0–100.0)
Monocytes Absolute: 0.9 10*3/uL (ref 0.1–1.0)
Monocytes Relative: 10 %
Neutro Abs: 5.8 10*3/uL (ref 1.7–7.7)
Neutrophils Relative %: 65 %
Platelet Count: 223 10*3/uL (ref 150–400)
RBC: 3.93 MIL/uL — ABNORMAL LOW (ref 4.22–5.81)
RDW: 17.5 % — ABNORMAL HIGH (ref 11.5–15.5)
WBC Count: 8.9 10*3/uL (ref 4.0–10.5)
nRBC: 0 % (ref 0.0–0.2)

## 2023-05-25 LAB — CMP (CANCER CENTER ONLY)
ALT: 10 U/L (ref 0–44)
AST: 12 U/L — ABNORMAL LOW (ref 15–41)
Albumin: 3.8 g/dL (ref 3.5–5.0)
Alkaline Phosphatase: 54 U/L (ref 38–126)
Anion gap: 8 (ref 5–15)
BUN: 15 mg/dL (ref 8–23)
CO2: 26 mmol/L (ref 22–32)
Calcium: 9.1 mg/dL (ref 8.9–10.3)
Chloride: 103 mmol/L (ref 98–111)
Creatinine: 1 mg/dL (ref 0.61–1.24)
GFR, Estimated: >60 mL/min (ref >60)
Glucose, Bld: 96 mg/dL (ref 70–99)
Potassium: 3.9 mmol/L (ref 3.5–5.1)
Sodium: 137 mmol/L (ref 135–145)
Total Bilirubin: 0.6 mg/dL (ref 0.0–1.2)
Total Protein: 6.4 g/dL — ABNORMAL LOW (ref 6.5–8.1)

## 2023-05-25 MED ORDER — SODIUM CHLORIDE 0.9% FLUSH
10.0000 mL | Freq: Once | INTRAVENOUS | Status: AC
  Administered 2023-05-25: 10 mL via INTRAVENOUS

## 2023-05-25 MED ORDER — HEPARIN SOD (PORK) LOCK FLUSH 100 UNIT/ML IV SOLN
500.0000 [IU] | Freq: Once | INTRAVENOUS | Status: AC
  Administered 2023-05-25: 500 [IU] via INTRAVENOUS

## 2023-05-25 NOTE — Progress Notes (Signed)
[facility] OFF[facility] PROGRESS [facility]   Diagnosis: Gastric cancer  INTERVAL HISTORY:   Todd Harrison returns as scheduled.  He began another cycle of Xeloda 05/07/2023.  No mouth sores, nausea, or diarrhea.  He feels the callus formation and superficial desquamation at the hands and feet is related to lichen planus.  A rash over his abdomen and back responded to a steroid Dosepak. He had increased back pain after getting off of the CT table this week. Objective:  Vital signs in last 24 hours:  Blood pressure 104/76, pulse 73, temperature 98.1 F (36.7 C), temperature source Temporal, resp. rate 18, height 6' (1.829 m), weight 252 lb 11.2 oz (114.6 kg), SpO2 98%.    HEENT: No thrush or ulcers Resp: Lungs clear bilaterally Cardio: Regular rate and rhythm GI: No hepatosplenomegaly, no apparent ascites, nontender Vascular: No leg edema  Skin: Palms and soles without erythema.  Callus formation and areas of superficial desquamation at the soles.  Portacath/PICC-without erythema  Lab Results:  Lab Results  Component Value Date   WBC 8.9 05/25/2023   HGB 12.4 (L) 05/25/2023   HCT 36.4 (L) 05/25/2023   MCV 92.6 05/25/2023   PLT 223 05/25/2023   NEUTROABS 5.8 05/25/2023    CMP  Lab Results  Component Value Date   NA 137 05/25/2023   K 3.9 05/25/2023   CL 103 05/25/2023   CO2 26 05/25/2023   GLUCOSE 96 05/25/2023   BUN 15 05/25/2023   CREATININE 1.00 05/25/2023   CALCIUM 9.1 05/25/2023   PROT 6.4 (L) 05/25/2023   ALBUMIN 3.8 05/25/2023   AST 12 (L) 05/25/2023   ALT 10 05/25/2023   ALKPHOS 54 05/25/2023   BILITOT 0.6 05/25/2023   GFRNONAA >60 05/25/2023   GFRAA 74 05/20/2020    Lab Results  Component Value Date   CEA1 <0.6 04/13/2022    Lab Results  Component Value Date   INR 1.3 (H) 07/24/2022   LABPROT 15.7 (H) 07/24/2022    Imaging:  CT CHEST ABDOMEN PELVIS W CONTRAST Result Date: 05/21/2023 CLINICAL DATA:  Gastric cancer restaging, ongoing  oral chemotherapy, assess treatment response * Tracking Code: BO * EXAM: CT CHEST, ABDOMEN, AND PELVIS WITH CONTRAST TECHNIQUE: Multidetector CT imaging of the chest, abdomen and pelvis was performed following the standard protocol during bolus administration of intravenous contrast. RADIATION DOSE REDUCTION: This exam was performed according to the departmental dose-optimization program which includes automated exposure control, adjustment of the mA and/or kV according to patient size and/or use of iterative reconstruction technique. CONTRAST:  OMNIPAQUE IOHEXOL 300 MG/ML  SOLN COMPARISON:  01/02/2023 FINDINGS: CT CHEST FINDINGS Cardiovascular: Right chest port catheter. Aortic atherosclerosis. Aortic valve calcifications. Normal heart size. Left and right coronary artery calcifications. No pericardial effusion. Mediastinum/Nodes: No enlarged mediastinal, hilar, or axillary lymph nodes. Thyroid gland, trachea, and esophagus demonstrate no significant findings. Lungs/Pleura: Unchanged mild bibasilar predominant pulmonary fibrosis featuring irregular interstitial opacity and septal thickening of the lung bases. No pleural effusion or pneumothorax. Musculoskeletal: No chest wall abnormality. No acute osseous findings. CT ABDOMEN PELVIS FINDINGS Hepatobiliary: No solid liver abnormality is seen. Unchanged portosystemic shunt in the right lobe of the liver (series 3, image 59). No gallstones, gallbladder wall thickening, or biliary dilatation. Pancreas: Unremarkable. No pancreatic ductal dilatation or surrounding inflammatory changes. Spleen: Normal in size without significant abnormality. Adrenals/Urinary Tract: Adrenal glands are unremarkable. Kidneys are normal, without renal calculi, solid lesion, or hydronephrosis. Bladder is unremarkable. Stomach/Bowel: Unchanged wall thickening of the gastric  body (series 3, image 64). Appendix appears normal. No evidence of bowel wall thickening, distention, or  inflammatory changes. Vascular/Lymphatic: Aortic atherosclerosis. No enlarged abdominal or pelvic lymph nodes. Reproductive: No mass or other abnormality. Other: No abdominal wall hernia or abnormality. Small volume ascites throughout the abdomen pelvis, slightly increased compared to prior examination. Diffuse peritoneal thickening throughout the abdomen and pelvis, with diffuse matting of the bowel and stranding of the omentum (series 3, image 68, 96). Musculoskeletal: No acute osseous findings. Slightly progressed anterior wedge deformity of L1, approximately 40% anterior height loss (series 7, image 81). IMPRESSION: 1. Unchanged wall thickening of the gastric body, consistent with primary gastric malignancy. 2. Small volume ascites throughout the abdomen and pelvis, slightly increased compared to prior examination. Diffuse peritoneal thickening throughout the abdomen and pelvis, with diffuse matting of the bowel and stranding of the omentum, similar in appearance to prior and consistent with peritoneal carcinomatosis. 3. No evidence of lymphadenopathy or metastatic disease in the chest. 4. Slightly progressed anterior wedge deformity of L1, approximately 40% anterior height loss. 5. Coronary artery disease. Aortic Atherosclerosis (ICD10-I70.0). Electronically Signed   By: Jearld Lesch M.D.   On: 05/21/2023 21:56    Medications: I have reviewed the patient's current medications.   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  Internal hemorrhoids.   No specimens collected. Upper endoscopy 04/25/2022-large infiltrative mass with oozing bleeding and stigmata of recent bleeding in the gastric fundus, on the anterior wall of the gastric body and on the greater curvature of the gastric body; deformity in the gastric antrum.  Extrinsic deformity in the entire duodenum.  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 Her2 by IHC negative, 1+; MMR IHC normal; PD-L1 CPS 1%, positive.  Cycle 1 FOLFOX 05/04/2022 Cycle 2 FOLFOX 05/17/2022 Cycle 3 FOLFOX 05/31/2022-oxaliplatin dose reduced secondary to prolonged cold sensitivity Cycle 4 FOLFOX 06/14/2022 Cycle 5 FOLFOX 06/28/2022 CT abdomen/pelvis 07/08/2022-asymmetric wall thickening at the lesser curvature of the stomach, decreased ascites, increased diffuse omental and peritoneal nodularity Cycle 6 FOLFOX 07/12/2022 Cycle 7 FOLFOX 08/09/2022 Cycle 8 FOLFOX 08/23/2022 Cycle 9 FOLFOX 09/06/2022 CTs 09/11/2022-stable, no new or progressive interval findings Cycle 10 FOLFOX 09/20/2022 Xeloda maintenance 2 weeks on/1 week off 10/16/2022 Cycle 2 Xeloda 11/06/2022-Xeloda dose reduced secondary to early foot skin toxicity Cycle 3 Xeloda 11/27/2022 Cycle 4 Xeloda 12/18/2022 CTs 01/02/2023-wall thickening of the mid gastric body-possibly progressive, unchanged peritoneal/omental caking, unchanged L1 compression fracture Cycle 5 Xeloda beginning 01/08/2023 Cycle 6 Xeloda beginning 01/29/2023 Cycle 7 Xeloda beginning 02/19/2023 Cycle 8 Xeloda beginning 03/12/2023 Cycle 9 Xeloda beginning 04/09/2023 Cycle 10 Xeloda beginning 05/07/2023 CTs 05/21/2023: Unchanged wall thickening of the gastric body, slight increase in small volume ascites, diffuse peritoneal thickening and stranding of the omentum, no evidence of metastatic disease to the chest Cycle 11 Xeloda beginning 05/28/2023 Truncal rash-status post recent punch biopsy with pathology pending; biopsy reported negative per family  04/26/2022 Lichen planus of the feet Hypothyroidism Atrial fibrillation Admission 07/24/2022 with acute bilateral pulmonary embolism/right heart strain Heparin anticoagulation 07/24/2022 Doppler 07/24/2022-acute DVT of the right femoral, popliteal, posterior tibial, and peroneal veins, acute left posterior tibial DVT Lovenox anticoagulation, changed to once daily dosing 05/25/2023  7.  Anemia secondary to chemotherapy, phlebotomy, and hemoptysis-improved 8.  L1 compression fracture on plain x-ray 12/18/2022 MRI lumbar spine 02/02/2023-acute/subacute L1 compression fracture, mild degenerative change of the lumbar spine     Disposition: Todd Harrison has metastatic gastric cancer.  He has been maintained on Xeloda since June 2024.  There is no clinical evidence of disease progression.  I reviewed the restaging CT findings and images with him.  The CTs are consistent with stable disease.  I recommend continuing Xeloda.  He will begin another cycle on 05/28/2023.  He will return for an office and lab visit in 3 weeks.  Mr. Rivett has been maintained on long-term Lovenox anticoagulation after being diagnosed with lower extremity DVTs in March.  He has difficulty with twice daily dosing.  He will change the Lovenox to once daily.    Thornton Papas, MD  05/25/2023  11:38 AM

## 2023-05-27 ENCOUNTER — Other Ambulatory Visit: Payer: Self-pay

## 2023-05-29 ENCOUNTER — Other Ambulatory Visit: Payer: Self-pay | Admitting: Cardiology

## 2023-06-05 ENCOUNTER — Other Ambulatory Visit: Payer: Self-pay

## 2023-06-05 ENCOUNTER — Encounter: Payer: Self-pay | Admitting: Oncology

## 2023-06-05 ENCOUNTER — Other Ambulatory Visit: Payer: Self-pay | Admitting: Oncology

## 2023-06-05 DIAGNOSIS — C168 Malignant neoplasm of overlapping sites of stomach: Secondary | ICD-10-CM

## 2023-06-05 MED ORDER — CAPECITABINE 500 MG PO TABS
ORAL_TABLET | ORAL | 0 refills | Status: DC
  Filled 2023-06-05: qty 70, 21d supply, fill #0

## 2023-06-05 NOTE — Progress Notes (Signed)
Specialty Pharmacy Refill Coordination Note  Todd Harrison is a 78 y.o. male contacted today regarding refills of specialty medication(s) Capecitabine (XELODA)   Patient requested Delivery   Delivery date: 06/14/23   Verified address: [address] [place] [address]   Medication will be filled on 06/13/23, pending refill approval.

## 2023-06-12 ENCOUNTER — Other Ambulatory Visit: Payer: Self-pay | Admitting: Cardiology

## 2023-06-13 ENCOUNTER — Encounter: Payer: Self-pay | Admitting: Oncology

## 2023-06-13 ENCOUNTER — Other Ambulatory Visit: Payer: Self-pay

## 2023-06-14 ENCOUNTER — Ambulatory Visit (HOSPITAL_COMMUNITY): Payer: Medicare HMO | Attending: Internal Medicine

## 2023-06-14 DIAGNOSIS — I350 Nonrheumatic aortic (valve) stenosis: Secondary | ICD-10-CM | POA: Insufficient documentation

## 2023-06-14 DIAGNOSIS — I77810 Thoracic aortic ectasia: Secondary | ICD-10-CM | POA: Diagnosis not present

## 2023-06-14 LAB — ECHOCARDIOGRAM COMPLETE
AR max vel: 1.46 cm2
AV Area VTI: 1.5 cm2
AV Area mean vel: 1.36 cm2
AV Mean grad: 24 mm[Hg]
AV Peak grad: 43.8 mm[Hg]
Ao pk vel: 3.31 m/s
Area-P 1/2: 2.86 cm2
S' Lateral: 3.3 cm

## 2023-06-15 ENCOUNTER — Other Ambulatory Visit: Payer: Self-pay

## 2023-06-15 ENCOUNTER — Inpatient Hospital Stay: Payer: Medicare HMO

## 2023-06-15 ENCOUNTER — Inpatient Hospital Stay: Payer: Medicare HMO | Attending: Nurse Practitioner

## 2023-06-15 ENCOUNTER — Other Ambulatory Visit: Payer: Self-pay | Admitting: *Deleted

## 2023-06-15 ENCOUNTER — Inpatient Hospital Stay (HOSPITAL_BASED_OUTPATIENT_CLINIC_OR_DEPARTMENT_OTHER): Payer: Medicare HMO | Admitting: Oncology

## 2023-06-15 VITALS — BP 101/67 | HR 79 | Temp 98.1°F | Resp 18 | Ht 72.0 in | Wt 250.1 lb

## 2023-06-15 DIAGNOSIS — L439 Lichen planus, unspecified: Secondary | ICD-10-CM | POA: Diagnosis not present

## 2023-06-15 DIAGNOSIS — C800 Disseminated malignant neoplasm, unspecified: Secondary | ICD-10-CM

## 2023-06-15 DIAGNOSIS — I4891 Unspecified atrial fibrillation: Secondary | ICD-10-CM | POA: Insufficient documentation

## 2023-06-15 DIAGNOSIS — E039 Hypothyroidism, unspecified: Secondary | ICD-10-CM | POA: Insufficient documentation

## 2023-06-15 DIAGNOSIS — Z86711 Personal history of pulmonary embolism: Secondary | ICD-10-CM | POA: Diagnosis not present

## 2023-06-15 DIAGNOSIS — Z86718 Personal history of other venous thrombosis and embolism: Secondary | ICD-10-CM | POA: Insufficient documentation

## 2023-06-15 DIAGNOSIS — C168 Malignant neoplasm of overlapping sites of stomach: Secondary | ICD-10-CM

## 2023-06-15 DIAGNOSIS — C169 Malignant neoplasm of stomach, unspecified: Secondary | ICD-10-CM | POA: Insufficient documentation

## 2023-06-15 DIAGNOSIS — D6481 Anemia due to antineoplastic chemotherapy: Secondary | ICD-10-CM | POA: Insufficient documentation

## 2023-06-15 DIAGNOSIS — Z95828 Presence of other vascular implants and grafts: Secondary | ICD-10-CM

## 2023-06-15 DIAGNOSIS — R21 Rash and other nonspecific skin eruption: Secondary | ICD-10-CM | POA: Diagnosis not present

## 2023-06-15 LAB — CBC WITH DIFFERENTIAL (CANCER CENTER ONLY)
Abs Immature Granulocytes: 0.02 10*3/uL (ref 0.00–0.07)
Basophils Absolute: 0.1 10*3/uL (ref 0.0–0.1)
Basophils Relative: 1 %
Eosinophils Absolute: 0.4 10*3/uL (ref 0.0–0.5)
Eosinophils Relative: 5 %
HCT: 35.9 % — ABNORMAL LOW (ref 39.0–52.0)
Hemoglobin: 12.2 g/dL — ABNORMAL LOW (ref 13.0–17.0)
Immature Granulocytes: 0 %
Lymphocytes Relative: 19 %
Lymphs Abs: 1.7 10*3/uL (ref 0.7–4.0)
MCH: 31.8 pg (ref 26.0–34.0)
MCHC: 34 g/dL (ref 30.0–36.0)
MCV: 93.5 fL (ref 80.0–100.0)
Monocytes Absolute: 0.7 10*3/uL (ref 0.1–1.0)
Monocytes Relative: 9 %
Neutro Abs: 5.6 10*3/uL (ref 1.7–7.7)
Neutrophils Relative %: 66 %
Platelet Count: 230 10*3/uL (ref 150–400)
RBC: 3.84 MIL/uL — ABNORMAL LOW (ref 4.22–5.81)
RDW: 17.7 % — ABNORMAL HIGH (ref 11.5–15.5)
WBC Count: 8.5 10*3/uL (ref 4.0–10.5)
nRBC: 0 % (ref 0.0–0.2)

## 2023-06-15 LAB — CMP (CANCER CENTER ONLY)
ALT: 8 U/L (ref 0–44)
AST: 11 U/L — ABNORMAL LOW (ref 15–41)
Albumin: 3.7 g/dL (ref 3.5–5.0)
Alkaline Phosphatase: 70 U/L (ref 38–126)
Anion gap: 7 (ref 5–15)
BUN: 17 mg/dL (ref 8–23)
CO2: 25 mmol/L (ref 22–32)
Calcium: 8.9 mg/dL (ref 8.9–10.3)
Chloride: 106 mmol/L (ref 98–111)
Creatinine: 1 mg/dL (ref 0.61–1.24)
GFR, Estimated: >60 mL/min (ref >60)
Glucose, Bld: 105 mg/dL — ABNORMAL HIGH (ref 70–99)
Potassium: 3.8 mmol/L (ref 3.5–5.1)
Sodium: 138 mmol/L (ref 135–145)
Total Bilirubin: 0.5 mg/dL (ref 0.0–1.2)
Total Protein: 6.4 g/dL — ABNORMAL LOW (ref 6.5–8.1)

## 2023-06-15 MED ORDER — POTASSIUM CHLORIDE CRYS ER 10 MEQ PO TBCR: 20.0000 meq | EXTENDED_RELEASE_TABLET | Freq: Every day | ORAL | 1 refills | Status: DC

## 2023-06-15 MED ORDER — HEPARIN SOD (PORK) LOCK FLUSH 100 UNIT/ML IV SOLN
500.0000 [IU] | Freq: Once | INTRAVENOUS | Status: AC
  Administered 2023-06-15: 500 [IU] via INTRAVENOUS

## 2023-06-15 MED ORDER — SODIUM CHLORIDE 0.9% FLUSH
10.0000 mL | Freq: Once | INTRAVENOUS | Status: AC
  Administered 2023-06-15: 10 mL via INTRAVENOUS

## 2023-06-15 NOTE — Patient Instructions (Signed)

## 2023-06-15 NOTE — Progress Notes (Signed)
 [facility] OFFICE PROGRESS NOTE   Diagnosis: Gastric cancer  INTERVAL HISTORY:   Todd Harrison returns as scheduled.  He completed another cycle of Xeloda  beginning 05/28/2023.  No mouth sores or diarrhea.  He had 1 episode of nausea last week.  No recent skin rash.  Objective:  Vital signs in last 24 hours:  Blood pressure 101/67, pulse 79, temperature 98.1 F (36.7 C), temperature source Temporal, resp. rate 18, height 6' (1.829 m), weight 250 lb 1.6 oz (113.4 kg), SpO2 99%.    HEENT: No thrush or ulcers Resp: Lungs clear bilaterally Cardio: Regular rate and rhythm GI: Mildly distended, no mass, no hepatosplenomegaly Vascular: No leg edema, the right lower leg is slightly larger than the left side  Skin: Palms without erythema, callus formation and hyperpigmentation of the soles   Lab Results:  Lab Results  Component Value Date   WBC 8.5 06/15/2023   HGB 12.2 (L) 06/15/2023   HCT 35.9 (L) 06/15/2023   MCV 93.5 06/15/2023   PLT 230 06/15/2023   NEUTROABS 5.6 06/15/2023    CMP  Lab Results  Component Value Date   NA 138 06/15/2023   K 3.8 06/15/2023   CL 106 06/15/2023   CO2 25 06/15/2023   GLUCOSE 105 (H) 06/15/2023   BUN 17 06/15/2023   CREATININE 1.00 06/15/2023   CALCIUM  8.9 06/15/2023   PROT 6.4 (L) 06/15/2023   ALBUMIN 3.7 06/15/2023   AST 11 (L) 06/15/2023   ALT 8 06/15/2023   ALKPHOS 70 06/15/2023   BILITOT 0.5 06/15/2023   GFRNONAA >60 06/15/2023   GFRAA 74 05/20/2020    Lab Results  Component Value Date   CEA1 <0.6 04/13/2022      Imaging:  ECHOCARDIOGRAM COMPLETE Result Date: 06/14/2023    ECHOCARDIOGRAM REPORT   Patient Name:   Todd Harrison Date of Exam: 06/14/2023 Medical Rec #:  [id number]       Height:       72.0 in Accession #:    [id number]      Weight:       252.7 lb Date of Birth:  [birth year]      BSA:          2.353 m Patient Age:    77 years        BP:           104/76 mmHg Patient Gender: M               HR:            67 bpm. Exam Location:  [address] Procedure: 2D Echo, Cardiac Doppler and Color Doppler Indications:    I35.0 AS  History:        Patient has prior history of Echocardiogram examinations, most                 recent 07/25/2022. Dilated aortic root, AS, Arrythmias:Atrial                 Fibrillation and Tachycardia; Risk Factors:Sleep Apnea.  Sonographer:    Elsie Bohr RDCS Referring Phys: [id number] HEATHER E PEM[place]ERTON IMPRESSIONS  1. Left ventricular ejection fraction, by estimation, is 60 to 65%. The left ventricle has normal function. The left ventricle has no regional wall motion abnormalities. There is severe concentric left ventricular hypertrophy. Left ventricular diastolic  parameters are consistent with Grade II diastolic dysfunction (pseudonormalization).  2. Right ventricular systolic function is normal. The right ventricular size is moderately enlarged.  3. Left atrial size was mildly dilated.  4. The mitral valve is normal in structure. No evidence of mitral valve regurgitation. No evidence of mitral stenosis.  5. The aortic valve is tricuspid. There is moderate calcification of the aortic valve. Aortic valve regurgitation is not visualized. Mild to moderate aortic valve stenosis. Aortic valve mean gradient measures 24.0 mmHg. Aortic valve Vmax measures 3.31 m/s.  6. Aortic dilatation noted. There is mild dilatation of the ascending aorta, measuring 41 mm. There is mild dilatation of the aortic root, measuring 41 mm.  7. The inferior vena cava is normal in size with greater than 50% respiratory variability, suggesting right atrial pressure of 3 mmHg. Comparison(s): Prior images reviewed side by side. Aorta is similar to prior. Gradients have increased. DVI has decrease from 0.42 to 0.33, suggesting increase on stenosis. FINDINGS  Left Ventricle: Left ventricular ejection fraction, by estimation, is 60 to 65%. The left ventricle has normal function. The left ventricle has no regional wall motion  abnormalities. The left ventricular internal cavity size was normal in size. There is  severe concentric left ventricular hypertrophy. Left ventricular diastolic parameters are consistent with Grade II diastolic dysfunction (pseudonormalization). Right Ventricle: The right ventricular size is moderately enlarged. No increase in right ventricular wall thickness. Right ventricular systolic function is normal. Left Atrium: Left atrial size was mildly dilated. Right Atrium: Right atrial size was normal in size. Pericardium: There is no evidence of pericardial effusion. Mitral Valve: The mitral valve is normal in structure. No evidence of mitral valve regurgitation. No evidence of mitral valve stenosis. Tricuspid Valve: The tricuspid valve is normal in structure. Tricuspid valve regurgitation is mild . No evidence of tricuspid stenosis. Aortic Valve: The aortic valve is tricuspid. There is moderate calcification of the aortic valve. Aortic valve regurgitation is not visualized. Mild to moderate aortic stenosis is present. Aortic valve mean gradient measures 24.0 mmHg. Aortic valve peak gradient measures 43.8 mmHg. Aortic valve area, by VTI measures 1.50 cm. Pulmonic Valve: The pulmonic valve was normal in structure. Pulmonic valve regurgitation is not visualized. No evidence of pulmonic stenosis. Aorta: Aortic dilatation noted. There is mild dilatation of the ascending aorta, measuring 41 mm. There is mild dilatation of the aortic root, measuring 41 mm. Venous: The inferior vena cava is normal in size with greater than 50% respiratory variability, suggesting right atrial pressure of 3 mmHg. IAS/Shunts: The atrial septum is grossly normal.  LEFT VENTRICLE PLAX 2D LVIDd:         4.70 cm   Diastology LVIDs:         3.30 cm   LV e' medial:    7.51 cm/s LV PW:         1.60 cm   LV E/e' medial:  14.0 LV IVS:        1.50 cm   LV e' lateral:   7.62 cm/s LVOT diam:     2.40 cm   LV E/e' lateral: 13.8 LV SV:         116 LV SV  Index:   49 LVOT Area:     4.52 cm  RIGHT VENTRICLE             IVC RV S prime:     15.70 cm/s  IVC diam: 1.10 cm TAPSE (M-mode): 2.7 cm RVSP:           26.2 mmHg LEFT ATRIUM              Index  RIGHT ATRIUM           Index LA diam:        3.70 cm  1.57 cm/m   RA Pressure: 3.00 mmHg LA Vol (A2C):   64.4 ml  27.37 ml/m  RA Area:     21.90 cm LA Vol (A4C):   104.0 ml 44.19 ml/m  RA Volume:   67.80 ml  28.81 ml/m LA Biplane Vol: 91.0 ml  38.67 ml/m  AORTIC VALVE AV Area (Vmax):    1.46 cm AV Area (Vmean):   1.36 cm AV Area (VTI):     1.50 cm AV Vmax:           331.00 cm/s AV Vmean:          228.000 cm/s AV VTI:            0.774 m AV Peak Grad:      43.8 mmHg AV Mean Grad:      24.0 mmHg LVOT Vmax:         107.00 cm/s LVOT Vmean:        68.400 cm/s LVOT VTI:          0.256 m LVOT/AV VTI ratio: 0.33  AORTA Ao Root diam: 4.10 cm Ao Asc diam:  4.10 cm MITRAL VALVE                TRICUSPID VALVE MV Area (PHT): 2.86 cm     TR Peak grad:   23.2 mmHg MV Decel Time: 265 msec     TR Vmax:        241.00 cm/s MV E velocity: 105.00 cm/s  Estimated RAP:  3.00 mmHg MV A velocity: 91.70 cm/s   RVSP:           26.2 mmHg MV E/A ratio:  1.15                             SHUNTS                             Systemic VTI:  0.26 m                             Systemic Diam: 2.40 cm Stanly Leavens MD Electronically signed by Stanly Leavens MD Signature Date/Time: 06/14/2023/2:06:59 PM    Final     Medications: I have reviewed the patient's current medications.   Assessment/Plan: Gastric cancer with CT evidence of abdominal carcinomatosis CT abdomen/pelvis 04/13/2022-ascites, diffuse omental and peritoneal nodularity, possible circumferential mass in the transverse colon, new small left greater than right pleural effusions CT abdominal mass biopsy 04/24/2022-metastatic poorly differentiated adenocarcinoma with very focal signet ring cell features CT paracentesis 04/24/2022-no malignant cells  identified Colonoscopy 04/25/2022-diverticulosis in the sigmoid colon.  Internal hemorrhoids.  No specimens collected. Upper endoscopy 04/25/2022-large infiltrative mass with oozing bleeding and stigmata of recent bleeding in the gastric fundus, on the anterior wall of the gastric body and on the greater curvature of the gastric body; deformity in the gastric antrum.  Extrinsic deformity in the entire duodenum.  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 Her2 by IHC negative, 1+; MMR IHC normal; PD-L1 CPS 1%, positive.  Cycle 1 FOLFOX 05/04/2022 Cycle 2 FOLFOX 05/17/2022 Cycle 3 FOLFOX 05/31/2022-oxaliplatin  dose reduced secondary to prolonged cold sensitivity Cycle 4 FOLFOX 06/14/2022 Cycle 5 FOLFOX  06/28/2022 CT abdomen/pelvis 07/08/2022-asymmetric wall thickening at the lesser curvature of the stomach, decreased ascites, increased diffuse omental and peritoneal nodularity Cycle 6 FOLFOX 07/12/2022 Cycle 7 FOLFOX 08/09/2022 Cycle 8 FOLFOX 08/23/2022 Cycle 9 FOLFOX 09/06/2022 CTs 09/11/2022-stable, no new or progressive interval findings Cycle 10 FOLFOX 09/20/2022 Xeloda  maintenance 2 weeks on/1 week off 10/16/2022 Cycle 2 Xeloda  11/06/2022-Xeloda  dose reduced secondary to early foot skin toxicity Cycle 3 Xeloda  11/27/2022 Cycle 4 Xeloda  12/18/2022 CTs 01/02/2023-wall thickening of the mid gastric body-possibly progressive, unchanged peritoneal/omental caking, unchanged L1 compression fracture Cycle 5 Xeloda  beginning 01/08/2023 Cycle 6 Xeloda  beginning 01/29/2023 Cycle 7 Xeloda  beginning 02/19/2023 Cycle 8 Xeloda  beginning 03/12/2023 Cycle 9 Xeloda  beginning 04/09/2023 Cycle 10 Xeloda  beginning 05/07/2023 CTs 05/21/2023: Unchanged wall thickening of the gastric body, slight increase in small volume ascites, diffuse peritoneal thickening and stranding of the omentum, no evidence of metastatic disease to the chest Cycle 11 Xeloda  beginning 05/28/2023 Cycle 12 Xeloda   beginning 06/18/2023 Truncal rash-status post recent punch biopsy with pathology pending; biopsy reported negative per family 04/26/2022 Lichen planus of the feet Hypothyroidism Atrial fibrillation Admission 07/24/2022 with acute bilateral pulmonary embolism/right heart strain Heparin  anticoagulation 07/24/2022 Doppler 07/24/2022-acute DVT of the right femoral, popliteal, posterior tibial, and peroneal veins, acute left posterior tibial DVT Lovenox  anticoagulation, changed to once daily dosing 05/25/2023 7.  Anemia secondary to chemotherapy, phlebotomy, and hemoptysis-improved 8.  L1 compression fracture on plain x-ray 12/18/2022 MRI lumbar spine 02/02/2023-acute/subacute L1 compression fracture, mild degenerative change of the lumbar spine       Disposition: Todd Harrison appears stable.  He is tolerating the Xeloda  well.  There is no clinical evidence for progression of gastric cancer.  He will complete another cycle of Xeloda  beginning 06/18/2023.  He will return for an office and lab visit in 3 weeks.  Arley Hof, MD  06/15/2023  11:37 AM

## 2023-06-21 ENCOUNTER — Telehealth: Payer: Self-pay | Admitting: Oncology

## 2023-06-21 NOTE — Telephone Encounter (Signed)
Telephone call completed.

## 2023-07-04 ENCOUNTER — Other Ambulatory Visit: Payer: Self-pay

## 2023-07-06 ENCOUNTER — Inpatient Hospital Stay: Payer: Medicare HMO

## 2023-07-06 ENCOUNTER — Inpatient Hospital Stay: Payer: Medicare HMO | Admitting: Oncology

## 2023-07-06 ENCOUNTER — Other Ambulatory Visit: Payer: Self-pay

## 2023-07-06 ENCOUNTER — Other Ambulatory Visit: Payer: Self-pay | Admitting: Oncology

## 2023-07-06 ENCOUNTER — Encounter: Payer: Self-pay | Admitting: Oncology

## 2023-07-06 VITALS — BP 103/72 | HR 70 | Temp 98.1°F | Resp 18 | Ht 72.0 in | Wt 250.8 lb

## 2023-07-06 DIAGNOSIS — Z95828 Presence of other vascular implants and grafts: Secondary | ICD-10-CM

## 2023-07-06 DIAGNOSIS — C168 Malignant neoplasm of overlapping sites of stomach: Secondary | ICD-10-CM

## 2023-07-06 DIAGNOSIS — C169 Malignant neoplasm of stomach, unspecified: Secondary | ICD-10-CM | POA: Diagnosis not present

## 2023-07-06 DIAGNOSIS — C800 Disseminated malignant neoplasm, unspecified: Secondary | ICD-10-CM

## 2023-07-06 LAB — CBC WITH DIFFERENTIAL (CANCER CENTER ONLY)
Abs Immature Granulocytes: 0.03 10*3/uL (ref 0.00–0.07)
Basophils Absolute: 0.1 10*3/uL (ref 0.0–0.1)
Basophils Relative: 1 %
Eosinophils Absolute: 0.5 10*3/uL (ref 0.0–0.5)
Eosinophils Relative: 6 %
HCT: 36.1 % — ABNORMAL LOW (ref 39.0–52.0)
Hemoglobin: 12.3 g/dL — ABNORMAL LOW (ref 13.0–17.0)
Immature Granulocytes: 0 %
Lymphocytes Relative: 24 %
Lymphs Abs: 2 10*3/uL (ref 0.7–4.0)
MCH: 31.8 pg (ref 26.0–34.0)
MCHC: 34.1 g/dL (ref 30.0–36.0)
MCV: 93.3 fL (ref 80.0–100.0)
Monocytes Absolute: 0.8 10*3/uL (ref 0.1–1.0)
Monocytes Relative: 9 %
Neutro Abs: 5.2 10*3/uL (ref 1.7–7.7)
Neutrophils Relative %: 60 %
Platelet Count: 216 10*3/uL (ref 150–400)
RBC: 3.87 MIL/uL — ABNORMAL LOW (ref 4.22–5.81)
RDW: 18 % — ABNORMAL HIGH (ref 11.5–15.5)
WBC Count: 8.6 10*3/uL (ref 4.0–10.5)
nRBC: 0 % (ref 0.0–0.2)

## 2023-07-06 LAB — CMP (CANCER CENTER ONLY)
ALT: 7 U/L (ref 0–44)
AST: 10 U/L — ABNORMAL LOW (ref 15–41)
Albumin: 3.7 g/dL (ref 3.5–5.0)
Alkaline Phosphatase: 64 U/L (ref 38–126)
Anion gap: 8 (ref 5–15)
BUN: 13 mg/dL (ref 8–23)
CO2: 26 mmol/L (ref 22–32)
Calcium: 8.8 mg/dL — ABNORMAL LOW (ref 8.9–10.3)
Chloride: 105 mmol/L (ref 98–111)
Creatinine: 0.98 mg/dL (ref 0.61–1.24)
GFR, Estimated: >60 mL/min (ref >60)
Glucose, Bld: 105 mg/dL — ABNORMAL HIGH (ref 70–99)
Potassium: 4 mmol/L (ref 3.5–5.1)
Sodium: 139 mmol/L (ref 135–145)
Total Bilirubin: 0.5 mg/dL (ref 0.0–1.2)
Total Protein: 6.1 g/dL — ABNORMAL LOW (ref 6.5–8.1)

## 2023-07-06 MED ORDER — SODIUM CHLORIDE 0.9% FLUSH
10.0000 mL | INTRAVENOUS | Status: DC | PRN
Start: 2023-07-06 — End: 2023-07-06
  Administered 2023-07-06: 10 mL via INTRAVENOUS

## 2023-07-06 MED ORDER — HEPARIN SOD (PORK) LOCK FLUSH 100 UNIT/ML IV SOLN
500.0000 [IU] | Freq: Once | INTRAVENOUS | Status: AC
  Administered 2023-07-06: 500 [IU] via INTRAVENOUS

## 2023-07-06 MED ORDER — CAPECITABINE 500 MG PO TABS
ORAL_TABLET | ORAL | 0 refills | Status: DC
Start: 2023-07-06 — End: 2023-07-27
  Filled 2023-07-06: qty 70, 21d supply, fill #0

## 2023-07-06 NOTE — Patient Instructions (Signed)

## 2023-07-06 NOTE — Progress Notes (Signed)
 Specialty Pharmacy Refill Coordination Note  BOB D Iddings is a 78 y.o. male contacted today regarding refills of specialty medication(s) Capecitabine (XELODA)   Patient requested Delivery   Delivery date: 07/10/23   Verified address: [address] [place] [address]   Medication will be filled on 03.03.25.

## 2023-07-06 NOTE — Progress Notes (Signed)
 [facility] OFFICE PROGRESS NOTE   Diagnosis: Gastric cancer  INTERVAL HISTORY:   Todd. Arkwright returns as scheduled.  He began another cycle of Xeloda on 05/28/2023.  No mouth sores, diarrhea, or hand/foot pain.  He reports a few episodes of vomiting over the past several weeks.  1 episode occurred after eating yogurt and 1 after dinner.  Objective:  Vital signs in last 24 hours:  Blood pressure 103/72, pulse 70, temperature 98.1 F (36.7 C), temperature source Oral, resp. rate 18, height 6' (1.829 m), weight 250 lb 12.8 oz (113.8 kg), SpO2 99%.    HEENT: No thrush or ulcers Resp: Lungs clear bilaterally Cardio: Regular rate and rhythm GI: No mass, no hepatosplenomegaly, mildly distended Vascular: No leg edema, the right lower leg is larger than the left side Skin: Palms without erythema  Portacath/PICC-without erythema  Lab Results:  Lab Results  Component Value Date   WBC 8.6 07/06/2023   HGB 12.3 (L) 07/06/2023   HCT 36.1 (L) 07/06/2023   MCV 93.3 07/06/2023   PLT 216 07/06/2023   NEUTROABS 5.2 07/06/2023    CMP  Lab Results  Component Value Date   NA 139 07/06/2023   K 4.0 07/06/2023   CL 105 07/06/2023   CO2 26 07/06/2023   GLUCOSE 105 (H) 07/06/2023   BUN 13 07/06/2023   CREATININE 0.98 07/06/2023   CALCIUM 8.8 (L) 07/06/2023   PROT 6.1 (L) 07/06/2023   ALBUMIN 3.7 07/06/2023   AST 10 (L) 07/06/2023   ALT 7 07/06/2023   ALKPHOS 64 07/06/2023   BILITOT 0.5 07/06/2023   GFRNONAA >60 07/06/2023   GFRAA 74 05/20/2020    Lab Results  Component Value Date   CEA1 <0.6 04/13/2022   reviewed the patient's current medications.   Assessment/Plan: Gastric cancer with CT evidence of abdominal carcinomatosis CT abdomen/pelvis 04/13/2022-ascites, diffuse omental and peritoneal nodularity, possible circumferential mass in the transverse colon, new small left greater than right pleural effusions CT abdominal mass biopsy 04/24/2022-metastatic  poorly differentiated adenocarcinoma with very focal signet ring cell features CT paracentesis 04/24/2022-no malignant cells identified Colonoscopy 04/25/2022-diverticulosis in the sigmoid colon.  Internal hemorrhoids.  No specimens collected. Upper endoscopy 04/25/2022-large infiltrative mass with oozing bleeding and stigmata of recent bleeding in the gastric fundus, on the anterior wall of the gastric body and on the greater curvature of the gastric body; deformity in the gastric antrum.  Extrinsic deformity in the entire duodenum.  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 Her2 by IHC negative, 1+; MMR IHC normal; PD-L1 CPS 1%, positive.  Cycle 1 FOLFOX 05/04/2022 Cycle 2 FOLFOX 05/17/2022 Cycle 3 FOLFOX 05/31/2022-oxaliplatin dose reduced secondary to prolonged cold sensitivity Cycle 4 FOLFOX 06/14/2022 Cycle 5 FOLFOX 06/28/2022 CT abdomen/pelvis 07/08/2022-asymmetric wall thickening at the lesser curvature of the stomach, decreased ascites, increased diffuse omental and peritoneal nodularity Cycle 6 FOLFOX 07/12/2022 Cycle 7 FOLFOX 08/09/2022 Cycle 8 FOLFOX 08/23/2022 Cycle 9 FOLFOX 09/06/2022 CTs 09/11/2022-stable, no new or progressive interval findings Cycle 10 FOLFOX 09/20/2022 Xeloda maintenance 2 weeks on/1 week off 10/16/2022 Cycle 2 Xeloda 11/06/2022-Xeloda dose reduced secondary to early foot skin toxicity Cycle 3 Xeloda 11/27/2022 Cycle 4 Xeloda 12/18/2022 CTs 01/02/2023-wall thickening of the mid gastric body-possibly progressive, unchanged peritoneal/omental caking, unchanged L1 compression fracture Cycle 5 Xeloda beginning 01/08/2023 Cycle 6 Xeloda beginning 01/29/2023 Cycle 7 Xeloda beginning 02/19/2023 Cycle 8 Xeloda beginning 03/12/2023 Cycle 9 Xeloda beginning 04/09/2023 Cycle 10 Xeloda beginning 05/07/2023 CTs 05/21/2023: Unchanged wall thickening of the  gastric body, slight increase in small volume ascites, diffuse peritoneal thickening and  stranding of the omentum, no evidence of metastatic disease to the chest Cycle 11 Xeloda beginning 05/28/2023 Cycle 12 Xeloda beginning 06/18/2023 Cycle 13 Xeloda beginning 07/09/2023 Truncal rash-status post recent punch biopsy with pathology pending; biopsy reported negative per family 04/26/2022 Lichen planus of the feet Hypothyroidism Atrial fibrillation Admission 07/24/2022 with acute bilateral pulmonary embolism/right heart strain Heparin anticoagulation 07/24/2022 Doppler 07/24/2022-acute DVT of the right femoral, popliteal, posterior tibial, and peroneal veins, acute left posterior tibial DVT Lovenox anticoagulation, changed to once daily dosing 05/25/2023 7.  Anemia secondary to chemotherapy, phlebotomy, and hemoptysis-improved 8.  L1 compression fracture on plain x-ray 12/18/2022 MRI lumbar spine 02/02/2023-acute/subacute L1 compression fracture, mild degenerative change of the lumbar spine        Disposition: Todd Harrison appears stable.  There is no clinical evidence for progression of gastric cancer.  He is tolerating Xeloda well.  He will begin another cycle on 07/09/2023.  He will call for persistent nausea or vomiting.  Thornton Papas, MD  07/06/2023  12:47 PM

## 2023-07-09 ENCOUNTER — Other Ambulatory Visit: Payer: Self-pay

## 2023-07-09 ENCOUNTER — Encounter: Payer: Self-pay | Admitting: Oncology

## 2023-07-19 ENCOUNTER — Other Ambulatory Visit (HOSPITAL_COMMUNITY): Payer: Self-pay

## 2023-07-23 ENCOUNTER — Other Ambulatory Visit (HOSPITAL_COMMUNITY): Payer: Self-pay

## 2023-07-27 ENCOUNTER — Other Ambulatory Visit (HOSPITAL_COMMUNITY): Payer: Self-pay

## 2023-07-27 ENCOUNTER — Inpatient Hospital Stay: Payer: Medicare HMO

## 2023-07-27 ENCOUNTER — Other Ambulatory Visit: Payer: Self-pay

## 2023-07-27 ENCOUNTER — Inpatient Hospital Stay (HOSPITAL_BASED_OUTPATIENT_CLINIC_OR_DEPARTMENT_OTHER): Payer: Medicare HMO | Admitting: Oncology

## 2023-07-27 ENCOUNTER — Other Ambulatory Visit: Payer: Self-pay | Admitting: Oncology

## 2023-07-27 ENCOUNTER — Inpatient Hospital Stay: Payer: Medicare HMO | Attending: Nurse Practitioner

## 2023-07-27 VITALS — BP 105/74 | HR 76 | Temp 98.1°F | Resp 18 | Ht 72.0 in | Wt 247.5 lb

## 2023-07-27 DIAGNOSIS — Z95828 Presence of other vascular implants and grafts: Secondary | ICD-10-CM

## 2023-07-27 DIAGNOSIS — C169 Malignant neoplasm of stomach, unspecified: Secondary | ICD-10-CM | POA: Insufficient documentation

## 2023-07-27 DIAGNOSIS — C168 Malignant neoplasm of overlapping sites of stomach: Secondary | ICD-10-CM

## 2023-07-27 DIAGNOSIS — D6481 Anemia due to antineoplastic chemotherapy: Secondary | ICD-10-CM | POA: Diagnosis not present

## 2023-07-27 DIAGNOSIS — C786 Secondary malignant neoplasm of retroperitoneum and peritoneum: Secondary | ICD-10-CM | POA: Insufficient documentation

## 2023-07-27 DIAGNOSIS — Z86718 Personal history of other venous thrombosis and embolism: Secondary | ICD-10-CM | POA: Insufficient documentation

## 2023-07-27 LAB — CMP (CANCER CENTER ONLY)
ALT: 7 U/L (ref 0–44)
AST: 10 U/L — ABNORMAL LOW (ref 15–41)
Albumin: 3.8 g/dL (ref 3.5–5.0)
Alkaline Phosphatase: 72 U/L (ref 38–126)
Anion gap: 8 (ref 5–15)
BUN: 13 mg/dL (ref 8–23)
CO2: 25 mmol/L (ref 22–32)
Calcium: 9.2 mg/dL (ref 8.9–10.3)
Chloride: 106 mmol/L (ref 98–111)
Creatinine: 0.96 mg/dL (ref 0.61–1.24)
GFR, Estimated: >60 mL/min (ref >60)
Glucose, Bld: 107 mg/dL — ABNORMAL HIGH (ref 70–99)
Potassium: 3.6 mmol/L (ref 3.5–5.1)
Sodium: 139 mmol/L (ref 135–145)
Total Bilirubin: 0.5 mg/dL (ref 0.0–1.2)
Total Protein: 6.3 g/dL — ABNORMAL LOW (ref 6.5–8.1)

## 2023-07-27 LAB — CBC WITH DIFFERENTIAL (CANCER CENTER ONLY)
Abs Immature Granulocytes: 0.04 10*3/uL (ref 0.00–0.07)
Basophils Absolute: 0.1 10*3/uL (ref 0.0–0.1)
Basophils Relative: 1 %
Eosinophils Absolute: 0.3 10*3/uL (ref 0.0–0.5)
Eosinophils Relative: 4 %
HCT: 37.5 % — ABNORMAL LOW (ref 39.0–52.0)
Hemoglobin: 12.6 g/dL — ABNORMAL LOW (ref 13.0–17.0)
Immature Granulocytes: 1 %
Lymphocytes Relative: 23 %
Lymphs Abs: 2 10*3/uL (ref 0.7–4.0)
MCH: 31.3 pg (ref 26.0–34.0)
MCHC: 33.6 g/dL (ref 30.0–36.0)
MCV: 93.3 fL (ref 80.0–100.0)
Monocytes Absolute: 0.6 10*3/uL (ref 0.1–1.0)
Monocytes Relative: 7 %
Neutro Abs: 5.6 10*3/uL (ref 1.7–7.7)
Neutrophils Relative %: 64 %
Platelet Count: 230 10*3/uL (ref 150–400)
RBC: 4.02 MIL/uL — ABNORMAL LOW (ref 4.22–5.81)
RDW: 18.5 % — ABNORMAL HIGH (ref 11.5–15.5)
WBC Count: 8.6 10*3/uL (ref 4.0–10.5)
nRBC: 0 % (ref 0.0–0.2)

## 2023-07-27 MED ORDER — HEPARIN SOD (PORK) LOCK FLUSH 100 UNIT/ML IV SOLN
500.0000 [IU] | Freq: Once | INTRAVENOUS | Status: AC
  Administered 2023-07-27: 500 [IU] via INTRAVENOUS

## 2023-07-27 MED ORDER — CAPECITABINE 500 MG PO TABS
ORAL_TABLET | ORAL | 0 refills | Status: DC
  Filled 2023-07-27: qty 70, 21d supply, fill #0

## 2023-07-27 MED ORDER — SODIUM CHLORIDE 0.9% FLUSH
10.0000 mL | INTRAVENOUS | Status: DC | PRN
  Administered 2023-07-27: 10 mL via INTRAVENOUS

## 2023-07-27 NOTE — Progress Notes (Signed)
 Specialty Pharmacy Refill Coordination Note  Todd Harrison is a 78 y.o. male contacted today regarding refills of specialty medication(s) Capecitabine (XELODA)   Patient requested Todd Harrison at [facility] at [facility] date: 07/27/23 (or Ship 03/24)   Medication will be filled on 07/27/23 or 07/30/23. Cam will verify with patient once rx has been received.  This fill date is pending response to refill request from provider. Patient is aware and if they have not received fill by intended date, they must follow up with pharmacy.

## 2023-07-27 NOTE — Progress Notes (Signed)
 [facility] OFFICE PROGRESS NOTE   Diagnosis: Gastric cancer  INTERVAL HISTORY:   Todd Harrison returns as scheduled.  He completed another cycle of Xeloda beginning 07/09/2023.  No mouth sores.  He has mild nausea after eating.  He reports 1 episode of vomiting an hour after eating [place] food.  He has intermittent constipation and diarrhea.  No change in the skin dryness and breakdown at the hands and feet.  He is working and exercising. He reports 2 episodes of "flashing lights "in his visual fields over the past week.  No associated symptoms.  The episodes lasted approximately 20 minutes.  He reports similar symptoms in the remote past. Objective:  Vital signs in last 24 hours:  Blood pressure 105/74, pulse 76, temperature 98.1 F (36.7 C), temperature source Temporal, resp. rate 18, height 6' (1.829 m), weight 247 lb 8 oz (112.3 kg), SpO2 98%.    HEENT: No thrush or ulcers Resp: Lungs clear bilaterally Cardio: Regular rate and rhythm GI: No hepatosplenomegaly, no apparent ascites, nontender Vascular: Right lower leg is larger than the left side  Skin: Dryness, scaling and mild hyperpigmentation of the soles.  Dryness of the hands  Portacath/PICC-without erythema  Lab Results:  Lab Results  Component Value Date   WBC 8.6 07/27/2023   HGB 12.6 (L) 07/27/2023   HCT 37.5 (L) 07/27/2023   MCV 93.3 07/27/2023   PLT 230 07/27/2023   NEUTROABS 5.6 07/27/2023    CMP  Lab Results  Component Value Date   NA 139 07/06/2023   K 4.0 07/06/2023   CL 105 07/06/2023   CO2 26 07/06/2023   GLUCOSE 105 (H) 07/06/2023   BUN 13 07/06/2023   CREATININE 0.98 07/06/2023   CALCIUM 8.8 (L) 07/06/2023   PROT 6.1 (L) 07/06/2023   ALBUMIN 3.7 07/06/2023   AST 10 (L) 07/06/2023   ALT 7 07/06/2023   ALKPHOS 64 07/06/2023   BILITOT 0.5 07/06/2023   GFRNONAA >60 07/06/2023   GFRAA 74 05/20/2020    Lab Results  Component Value Date   CEA1 <0.6 04/13/2022     Medications: I have reviewed the patient's current medications.   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  Internal hemorrhoids.  No specimens collected. Upper endoscopy 04/25/2022-large infiltrative mass with oozing bleeding and stigmata of recent bleeding in the gastric fundus, on the anterior wall of the gastric body and on the greater curvature of the gastric body; deformity in the gastric antrum.  Extrinsic deformity in the entire duodenum.  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 Her2 by IHC negative, 1+; MMR IHC normal; PD-L1 CPS 1%, positive.  Cycle 1 FOLFOX 05/04/2022 Cycle 2 FOLFOX 05/17/2022 Cycle 3 FOLFOX 05/31/2022-oxaliplatin dose reduced secondary to prolonged cold sensitivity Cycle 4 FOLFOX 06/14/2022 Cycle 5 FOLFOX 06/28/2022 CT abdomen/pelvis 07/08/2022-asymmetric wall thickening at the lesser curvature of the stomach, decreased ascites, increased diffuse omental and peritoneal nodularity Cycle 6 FOLFOX 07/12/2022 Cycle 7 FOLFOX 08/09/2022 Cycle 8 FOLFOX 08/23/2022 Cycle 9 FOLFOX 09/06/2022 CTs 09/11/2022-stable, no new or progressive interval findings Cycle 10 FOLFOX 09/20/2022 Xeloda maintenance 2 weeks on/1 week off 10/16/2022 Cycle 2 Xeloda 11/06/2022-Xeloda dose reduced secondary to early  foot skin toxicity Cycle 3 Xeloda 11/27/2022 Cycle 4 Xeloda 12/18/2022 CTs 01/02/2023-wall thickening of the mid gastric body-possibly progressive, unchanged peritoneal/omental caking,  unchanged L1 compression fracture Cycle 5 Xeloda beginning 01/08/2023 Cycle 6 Xeloda beginning 01/29/2023 Cycle 7 Xeloda beginning 02/19/2023 Cycle 8 Xeloda beginning 03/12/2023 Cycle 9 Xeloda beginning 04/09/2023 Cycle 10 Xeloda beginning 05/07/2023 CTs 05/21/2023: Unchanged wall thickening of the gastric body, slight increase in small volume ascites, diffuse peritoneal thickening and stranding of the omentum, no evidence of metastatic disease to the chest Cycle 11 Xeloda beginning 05/28/2023 Cycle 12 Xeloda beginning 06/18/2023 Cycle 13 Xeloda beginning 07/09/2023 Cycle 14 Xeloda beginning 07/30/2023 Truncal rash-status post recent punch biopsy with pathology pending; biopsy reported negative per family 04/26/2022 Lichen planus of the feet Hypothyroidism Atrial fibrillation Admission 07/24/2022 with acute bilateral pulmonary embolism/right heart strain Heparin anticoagulation 07/24/2022 Doppler 07/24/2022-acute DVT of the right femoral, popliteal, posterior tibial, and peroneal veins, acute left posterior tibial DVT Lovenox anticoagulation, changed to once daily dosing 05/25/2023 7.  Anemia secondary to chemotherapy, phlebotomy, and hemoptysis-improved 8.  L1 compression fracture on plain x-ray 12/18/2022 MRI lumbar spine 02/02/2023-acute/subacute L1 compression fracture, mild degenerative change of the lumbar spine    Disposition: Todd Harrison appears stable.  He will begin another cycle of Xeloda on 07/30/2023.  There is no clinical evidence for progression of the gastric cancer.  I think it is unlikely the visual symptoms are related to gastric cancer or capecitabine.  He will seek medical attention if the visual symptoms persist.  Todd Harrison will return for an office and lab visit in 3 weeks.  Thornton Papas, MD  07/27/2023  11:31 AM

## 2023-07-27 NOTE — Progress Notes (Signed)
 Patients port flushed without difficulty.  Good blood return noted with no bruising or swelling noted at site.  Band aid applied.  Patient to see Dr Truett Perna.

## 2023-07-28 ENCOUNTER — Other Ambulatory Visit: Payer: Self-pay

## 2023-07-29 ENCOUNTER — Other Ambulatory Visit: Payer: Self-pay | Admitting: Oncology

## 2023-07-30 ENCOUNTER — Encounter: Payer: Self-pay | Admitting: Oncology

## 2023-07-30 ENCOUNTER — Ambulatory Visit: Payer: Medicare HMO | Attending: Cardiology | Admitting: Cardiology

## 2023-07-30 DIAGNOSIS — Z91199 Patient's noncompliance with other medical treatment and regimen due to unspecified reason: Secondary | ICD-10-CM

## 2023-07-31 NOTE — Progress Notes (Signed)
 No show

## 2023-08-11 ENCOUNTER — Encounter (HOSPITAL_COMMUNITY): Payer: Self-pay | Admitting: Emergency Medicine

## 2023-08-11 ENCOUNTER — Emergency Department (HOSPITAL_COMMUNITY)

## 2023-08-11 ENCOUNTER — Other Ambulatory Visit: Payer: Self-pay

## 2023-08-11 ENCOUNTER — Emergency Department (HOSPITAL_COMMUNITY)
Admission: EM | Admit: 2023-08-11 | Discharge: 2023-08-11 | Disposition: A | Discharge status: Home / Self Care | Attending: Emergency Medicine | Admitting: Emergency Medicine

## 2023-08-11 DIAGNOSIS — W19XXXA Unspecified fall, initial encounter: Secondary | ICD-10-CM

## 2023-08-11 DIAGNOSIS — S0003XA Contusion of scalp, initial encounter: Secondary | ICD-10-CM | POA: Insufficient documentation

## 2023-08-11 DIAGNOSIS — S50312A Abrasion of left elbow, initial encounter: Secondary | ICD-10-CM | POA: Insufficient documentation

## 2023-08-11 DIAGNOSIS — C169 Malignant neoplasm of stomach, unspecified: Secondary | ICD-10-CM | POA: Diagnosis not present

## 2023-08-11 DIAGNOSIS — W010XXA Fall on same level from slipping, tripping and stumbling without subsequent striking against object, initial encounter: Secondary | ICD-10-CM | POA: Insufficient documentation

## 2023-08-11 DIAGNOSIS — S0990XA Unspecified injury of head, initial encounter: Secondary | ICD-10-CM

## 2023-08-11 LAB — CBC WITH DIFFERENTIAL/PLATELET
Abs Immature Granulocytes: 0.07 10*3/uL (ref 0.00–0.07)
Basophils Absolute: 0.1 10*3/uL (ref 0.0–0.1)
Basophils Relative: 1 %
Eosinophils Absolute: 0.3 10*3/uL (ref 0.0–0.5)
Eosinophils Relative: 3 %
HCT: 35.6 % — ABNORMAL LOW (ref 39.0–52.0)
Hemoglobin: 11.9 g/dL — ABNORMAL LOW (ref 13.0–17.0)
Immature Granulocytes: 1 %
Lymphocytes Relative: 22 %
Lymphs Abs: 1.9 10*3/uL (ref 0.7–4.0)
MCH: 32 pg (ref 26.0–34.0)
MCHC: 33.4 g/dL (ref 30.0–36.0)
MCV: 95.7 fL (ref 80.0–100.0)
Monocytes Absolute: 0.7 10*3/uL (ref 0.1–1.0)
Monocytes Relative: 8 %
Neutro Abs: 5.5 10*3/uL (ref 1.7–7.7)
Neutrophils Relative %: 65 %
Platelets: 203 10*3/uL (ref 150–400)
RBC: 3.72 MIL/uL — ABNORMAL LOW (ref 4.22–5.81)
RDW: 18.2 % — ABNORMAL HIGH (ref 11.5–15.5)
WBC: 8.5 10*3/uL (ref 4.0–10.5)
nRBC: 0 % (ref 0.0–0.2)

## 2023-08-11 LAB — COMPREHENSIVE METABOLIC PANEL WITH GFR
ALT: 12 U/L (ref 0–44)
AST: 19 U/L (ref 15–41)
Albumin: 3.1 g/dL — ABNORMAL LOW (ref 3.5–5.0)
Alkaline Phosphatase: 55 U/L (ref 38–126)
Anion gap: 10 (ref 5–15)
BUN: 13 mg/dL (ref 8–23)
CO2: 23 mmol/L (ref 22–32)
Calcium: 9 mg/dL (ref 8.9–10.3)
Chloride: 102 mmol/L (ref 98–111)
Creatinine, Ser: 1.21 mg/dL (ref 0.61–1.24)
GFR, Estimated: >60 mL/min (ref >60)
Glucose, Bld: 104 mg/dL — ABNORMAL HIGH (ref 70–99)
Potassium: 3.9 mmol/L (ref 3.5–5.1)
Sodium: 135 mmol/L (ref 135–145)
Total Bilirubin: 0.8 mg/dL (ref 0.0–1.2)
Total Protein: 5.9 g/dL — ABNORMAL LOW (ref 6.5–8.1)

## 2023-08-11 LAB — TROPONIN I (HIGH SENSITIVITY)
Troponin I (High Sensitivity): 7 ng/L (ref <18)
Troponin I (High Sensitivity): 6 ng/L (ref <18)

## 2023-08-11 MED ORDER — SODIUM CHLORIDE 0.9 % IV BOLUS
500.0000 mL | Freq: Once | INTRAVENOUS | Status: AC
  Administered 2023-08-11: 500 mL via INTRAVENOUS

## 2023-08-11 MED ORDER — ONDANSETRON HCL 4 MG PO TABS: 4.0000 mg | ORAL_TABLET | Freq: Four times a day (QID) | ORAL | 0 refills | Status: DC

## 2023-08-11 MED ORDER — ONDANSETRON HCL 4 MG/2ML IJ SOLN
4.0000 mg | Freq: Once | INTRAMUSCULAR | Status: AC
  Administered 2023-08-11: 4 mg via INTRAVENOUS
  Filled 2023-08-11: qty 2

## 2023-08-11 NOTE — Progress Notes (Signed)
 Orthopedic Tech Progress Note Patient Details:  Todd Harrison Aug 08, 1945 [id number]  Patient ID: Burnell Blanks, male   DOB: [birth year], 78 y.o.   MRN: [id number] I attended trauma page. Nachman, Sundt 08/11/2023, 8:09 PM

## 2023-08-11 NOTE — Progress Notes (Signed)
 Chaplain responds to Level 2 fall on thinners and provides compassionate presence as medical staff for pt. Family members arrive and chaplain ensures they get to pt's room.

## 2023-08-11 NOTE — Discharge Instructions (Signed)
 Return for any problem.  ?

## 2023-08-11 NOTE — ED Notes (Signed)
 Patient ambulated to the restroom without difficulty.

## 2023-08-11 NOTE — ED Triage Notes (Signed)
 Patient BIB EMS from home. Patient had a ground level fall from standing; patient is on Lovenox injections daily. Patient hit the back of his head on the floor. Patient c/o posterior head injury & R elbow skin tear.

## 2023-08-11 NOTE — ED Notes (Signed)
 Patient R skin tear elbow dressed; bleeding controlled.

## 2023-08-11 NOTE — ED Notes (Signed)
 Trauma Response Nurse Documentation   Todd Harrison is a 78 y.o. male arriving to [facility] ED via [facility]  On Lovenox. Trauma was activated as a Level 2 by Beverely Risen based on the following trauma criteria Elderly patients > 65 with head trauma on anti-coagulation (excluding ASA).  Patient cleared for CT by Dr. Rodena Medin. Pt transported to CT with trauma response nurse present to monitor. RN remained with the patient throughout their absence from the department for clinical observation.   GCS 15.  Trauma MD Arrival Time: N/A.  History   Past Medical History:  Diagnosis Date   A-fib (HCC)    Asthma    Cataract    Gastric cancer (HCC)    GERD (gastroesophageal reflux disease)    Lichen planus    Methotrexate, long term, current use    no longer taking   Psoriasis    Sleep apnea    Thyroid disease    Urticaria      Past Surgical History:  Procedure Laterality Date   CARDIOVERSION N/A 02/23/2020   Procedure: CARDIOVERSION;  Surgeon: Christell Constant, MD;  Location: [facility] ENDOSCOPY;  Service: Cardiovascular;  Laterality: N/A;   CARDIOVERSION N/A 04/12/2020   Procedure: CARDIOVERSION;  Surgeon: Jodelle Red, MD;  Location: [facility] ENDOSCOPY;  Service: Cardiovascular;  Laterality: N/A;   CATARACT EXTRACTION Left    2019   IR IMAGING GUIDED PORT INSERTION  05/03/2022   TONSILLECTOMY         Initial Focused Assessment (If applicable, or please see trauma documentation): Airway-- intact, no visible obstruction Breathing-- spontaneous, unlabored Circulation-- no obvious bleeding noted on exam  CT's Completed:   CT Head and CT C-Spine   Interventions:  See event summary  Plan for disposition:  {Trauma Dispo:26867}   Consults completed:  {Trauma Consults:26862} at ***.  Event Summary: Patient arrives via [facility]. Patient with a ground level fall. On arrival patient not complaining of any pain. Manual BP obtained. Lab work  obtained. Xray chest completed. Patient to CT with Primary RN. CT head and c-spine completed.  MTP Summary (If applicable):  N/A  Bedside handoff with ED RN Brayton Caves.    Leota Sauers  Trauma Response RN  Please call TRN at [phone] for further assistance.

## 2023-08-12 NOTE — ED Provider Notes (Signed)
 [place] EMERGENCY DEPARTMENT AT [facility] Provider Note   CSN: [id number] Arrival date & time: 08/11/23  1949     History  Chief Complaint  Patient presents with   Todd Harrison is a 78 y.o. male.  78 year old male with prior medical history as detailed below presents for evaluation.  He presents after fall from standing.  He does use Lovenox daily.  Patient reports that he had a wave of nausea and then lost his balance and fell.  He did strike his head.  On evaluation he denies nausea currently.  He denies significant pain.  The history is provided by the patient.       Home Medications Prior to Admission medications   Medication Sig Start Date End Date Taking? Authorizing Provider  ondansetron (ZOFRAN) 4 MG tablet Take 1 tablet (4 mg total) by mouth every 6 (six) hours. 08/11/23  Yes Wynetta Fines, MD  acitretin (SORIATANE) 25 MG capsule Take 25 mg by mouth daily. 01/09/23   [provider]  albuterol (VENTOLIN HFA) 108 (90 Base) MCG/ACT inhaler TAKE 2 PUFFS BY MOUTH EVERY 6 HOURS AS NEEDED FOR WHEEZE OR SHORTNESS OF BREATH 03/22/23   Ladene Artist, MD  capecitabine (XELODA) 500 MG tablet Take 3 tablets (1500 mg) by mouth in AM and 2 tablets (1000 mg) in PM. Take with food. Take for 14 days, then hold for 7 days. Repeat every 21 days. Start cycle on 07/30/23 07/27/23   Ladene Artist, MD  clobetasol ointment (TEMOVATE) 0.05 % Apply 1 Application topically 2 (two) times daily. 01/02/23   [provider]  enoxaparin (LOVENOX) 120 MG/0.8ML injection Inject 0.8 mLs (120 mg total) into the skin every 12 (twelve) hours. 12/28/22   Ladene Artist, MD  fluticasone (FLONASE) 50 MCG/ACT nasal spray Place 1 spray into both nostrils daily as needed for allergies or rhinitis.    [provider]  fluticasone furoate-vilanterol (BREO ELLIPTA) 100-25 MCG/ACT AEPB Inhale 1 puff into the lungs daily. 07/03/22   Josephine Igo, DO   hydroxychloroquine (PLAQUENIL) 200 MG tablet Take 200 mg by mouth 2 (two) times daily. Patient not taking: Reported on 05/04/2023 06/17/21   [provider]  hydrOXYzine (ATARAX) 25 MG tablet Take 25 mg by mouth 3 (three) times daily as needed. 04/19/23   [provider]  levothyroxine (SYNTHROID) 112 MCG tablet Take 112 mcg by mouth daily before breakfast.    [provider]  lidocaine-prilocaine (EMLA) cream Apply 1 Application topically as needed (Apply 1 hour before coming to [facility] for [facility]). 04/27/22   Ladene Artist, MD  loratadine (CLARITIN) 10 MG tablet Take 10 mg by mouth daily.    [provider]  magic mouthwash (nystatin, diphenhydrAMINE, alum & mag hydroxide) suspension mixture Swish and spit 5 mLs 4 (four) times daily as needed for mouth pain/sores Patient not taking: Reported on 05/25/2023 05/04/23   Rana Snare, NP  methylPREDNISolone (MEDROL DOSEPAK) 4 MG TBPK tablet Take by mouth daily at 6 (six) AM. Take 32 mg (8 tab) daily x 1 day, then 20 mg (5 tab) daily x 2 days, then 12 mg (3 tab) daily x 2 days, then 8 mg (2 tab) daily x 1 day and then stop Patient not taking: Reported on 05/25/2023 05/10/23   Ladene Artist, MD  metoprolol succinate (TOPROL-XL) 50 MG 24 hr tablet TAKE 1 TABLET BY MOUTH EVERY DAY 05/30/23   Steffanie Dunn  T, MD  omeprazole (PRILOSEC) 40 MG capsule Take 40 mg by mouth daily. 04/12/22   [provider]  ondansetron (ZOFRAN) 8 MG tablet Take 1 tablet (8 mg total) by mouth every 8 (eight) hours as needed for nausea or vomiting. Do not take within 72 hours of day 1 chemo 09/20/22   Rana Snare, NP  oxyCODONE-acetaminophen (PERCOCET/ROXICET) 5-325 MG tablet Take 1 tablet by mouth daily as needed for severe pain (Pain not relieved with tramadol). Patient not taking: Reported on 07/06/2023 02/15/23   Ladene Artist, MD  polyethylene glycol (MIRALAX / GLYCOLAX) 17 g packet Take 17 g by mouth daily.     [provider]  potassium chloride (KLOR-CON M10) 10 MEQ tablet Take 2 tablets (20 mEq total) by mouth daily. 06/15/23   Ladene Artist, MD  prochlorperazine (COMPAZINE) 10 MG tablet Take 1 tablet (10 mg total) by mouth every 6 (six) hours as needed for nausea or vomiting. Patient not taking: Reported on 07/06/2023 05/04/23   Rana Snare, NP  traMADol (ULTRAM) 50 MG tablet Take 1 tablet (50 mg total) by mouth every 6 (six) hours as needed for moderate pain. Patient not taking: Reported on 07/06/2023 01/17/23   Ladene Artist, MD  triamcinolone (KENALOG) 0.1 % Apply 1 application  topically daily as needed (lichen planus flare).    [provider]      Allergies    Gold and Mixed grasses    Review of Systems   Review of Systems  All other systems reviewed and are negative.   Physical Exam Updated Vital Signs BP 99/75   Pulse 69   Temp 98.4 F (36.9 C) (Oral)   Resp 19   Ht 6' (1.829 m)   Wt 110.2 kg   SpO2 100%   BMI 32.96 kg/m  Physical Exam Vitals and nursing note reviewed.  Constitutional:      General: He is not in acute distress.    Appearance: Normal appearance. He is well-developed.  HENT:     Head: Normocephalic.     Comments: Contusion to posterior scalp. Eyes:     Conjunctiva/sclera: Conjunctivae normal.     Pupils: Pupils are equal, round, and reactive to light.  Neck:     Comments: Cervical collar in place.  No midline tenderness. Cardiovascular:     Rate and Rhythm: Normal rate and regular rhythm.     Heart sounds: Normal heart sounds.  Pulmonary:     Effort: Pulmonary effort is normal. No respiratory distress.     Breath sounds: Normal breath sounds.  Abdominal:     General: There is no distension.     Palpations: Abdomen is soft.     Tenderness: There is no abdominal tenderness.  Musculoskeletal:        General: No deformity. Normal range of motion.  Skin:    General: Skin is warm and dry.     Comments: Superficial abrasion to  the left elbow.  Neurological:     General: No focal deficit present.     Mental Status: He is alert and oriented to person, place, and time.     ED Results / Procedures / Treatments   Labs (all labs ordered are listed, but only abnormal results are displayed) Labs Reviewed  CBC WITH DIFFERENTIAL/PLATELET - Abnormal; Notable for the following components:      Result Value   RBC 3.72 (*)    Hemoglobin 11.9 (*)    HCT 35.6 (*)  RDW 18.2 (*)    All other components within normal limits  COMPREHENSIVE METABOLIC PANEL WITH GFR - Abnormal; Notable for the following components:   Glucose, Bld 104 (*)    Total Protein 5.9 (*)    Albumin 3.1 (*)    All other components within normal limits  TROPONIN I (HIGH SENSITIVITY)  TROPONIN I (HIGH SENSITIVITY)    EKG None  Radiology CT Head Wo Contrast Result Date: 08/11/2023 CLINICAL DATA:  Trauma EXAM: CT HEAD WITHOUT CONTRAST CT CERVICAL SPINE WITHOUT CONTRAST TECHNIQUE: Multidetector CT imaging of the head and cervical spine was performed following the standard protocol without intravenous contrast. Multiplanar CT image reconstructions of the cervical spine were also generated. RADIATION DOSE REDUCTION: This exam was performed according to the departmental dose-optimization program which includes automated exposure control, adjustment of the mA and/or kV according to patient size and/or use of iterative reconstruction technique. COMPARISON:  None Available. FINDINGS: CT HEAD FINDINGS Brain: No mass,hemorrhage or extra-axial collection. Normal appearance of the parenchyma and CSF spaces. Vascular: No hyperdense vessel or unexpected vascular calcification. Skull: Right parietal scalp hematoma.  No skull fracture Sinuses/Orbits: No fluid levels or advanced mucosal thickening of the visualized paranasal sinuses. No mastoid or middle ear effusion. Normal orbits. Other: None. CT CERVICAL SPINE FINDINGS Alignment: No static subluxation. Facets are  aligned. Occipital condyles are normally positioned. Skull base and vertebrae: No acute fracture. Soft tissues and spinal canal: No prevertebral fluid or swelling. No visible canal hematoma. Disc levels: Ossification of the posterior longitudinal ligament at C3-4 without spinal canal stenosis. Multilevel anterior vertebral body osteophytes. Upper chest: No pneumothorax, pulmonary nodule or pleural effusion. Other: Normal visualized paraspinal cervical soft tissues. IMPRESSION: 1. No acute intracranial abnormality. 2. Right parietal scalp hematoma without skull fracture. 3. No acute fracture or static subluxation of the cervical spine. Electronically Signed   By: Deatra Robinson M.D.   On: 08/11/2023 20:45   CT Cervical Spine Wo Contrast Result Date: 08/11/2023 CLINICAL DATA:  Trauma EXAM: CT HEAD WITHOUT CONTRAST CT CERVICAL SPINE WITHOUT CONTRAST TECHNIQUE: Multidetector CT imaging of the head and cervical spine was performed following the standard protocol without intravenous contrast. Multiplanar CT image reconstructions of the cervical spine were also generated. RADIATION DOSE REDUCTION: This exam was performed according to the departmental dose-optimization program which includes automated exposure control, adjustment of the mA and/or kV according to patient size and/or use of iterative reconstruction technique. COMPARISON:  None Available. FINDINGS: CT HEAD FINDINGS Brain: No mass,hemorrhage or extra-axial collection. Normal appearance of the parenchyma and CSF spaces. Vascular: No hyperdense vessel or unexpected vascular calcification. Skull: Right parietal scalp hematoma.  No skull fracture Sinuses/Orbits: No fluid levels or advanced mucosal thickening of the visualized paranasal sinuses. No mastoid or middle ear effusion. Normal orbits. Other: None. CT CERVICAL SPINE FINDINGS Alignment: No static subluxation. Facets are aligned. Occipital condyles are normally positioned. Skull base and vertebrae: No acute  fracture. Soft tissues and spinal canal: No prevertebral fluid or swelling. No visible canal hematoma. Disc levels: Ossification of the posterior longitudinal ligament at C3-4 without spinal canal stenosis. Multilevel anterior vertebral body osteophytes. Upper chest: No pneumothorax, pulmonary nodule or pleural effusion. Other: Normal visualized paraspinal cervical soft tissues. IMPRESSION: 1. No acute intracranial abnormality. 2. Right parietal scalp hematoma without skull fracture. 3. No acute fracture or static subluxation of the cervical spine. Electronically Signed   By: Deatra Robinson M.D.   On: 08/11/2023 20:45   DG Chest Port 1 View Result Date:  08/11/2023 CLINICAL DATA:  Status post fall. EXAM: PORTABLE CHEST 1 VIEW COMPARISON:  October 21, 2022 FINDINGS: There is stable right-sided venous Port-A-Cath positioning. The heart size and mediastinal contours are within normal limits. There is no evidence of an acute infiltrate, a transition or pneumothorax. The visualized skeletal structures are unremarkable. IMPRESSION: No active cardiopulmonary disease. Electronically Signed   By: Aram Candela M.D.   On: 08/11/2023 20:28    Procedures Procedures    Medications Ordered in ED Medications  ondansetron [facility]) injection 4 mg (4 mg Intravenous Given 08/11/23 2008)  sodium chloride 0.9 % bolus 500 mL (0 mLs Intravenous Stopped 08/11/23 2215)    ED Course/ Medical Decision Making/ A&P                                 Medical Decision Making Amount and/or Complexity of Data Reviewed Labs: ordered. Radiology: ordered.  Risk Prescription drug management.    Medical Screen Complete  This patient presented to the ED with complaint of fall, head injury.  This complaint involves an extensive number of treatment options. The initial differential diagnosis includes, but is not limited to, trauma, metabolic abnormality, etc.  This presentation is: Acute, Chronic, Self-Limited, Previously  Undiagnosed, Uncertain Prognosis, Complicated, Systemic Symptoms, and Threat to Life/Bodily Function  Patient presents after fall from standing.  He appears to have a vasovagal episode after experiencing nausea.  His workup is without evidence of acute injury.  Obtained imaging is reassuringly without significant abnormality.  Patient feels much improved after ED evaluation and workup.  He desires discharge.  He declines additional workup or observation.  Importance of close follow-up was stressed.  Strict return precautions given and understood.  Co morbidities that complicated the patient's evaluation  See HPI   Additional history obtained:  Additional history obtained from EMS and Spouse External records from outside sources obtained and reviewed including prior ED visits and prior Inpatient records.     Problem List / ED Course:  Fall, head injury   Reevaluation:  After the interventions noted above, I reevaluated the patient and found that they have: improved    Disposition:  After consideration of the diagnostic results and the patients response to treatment, I feel that the patent would benefit from close outpatient follow-up.          Final Clinical Impression(s) / ED Diagnoses Final diagnoses:  Fall, initial encounter  Injury of head, initial encounter    Rx / DC Orders ED Discharge Orders          Ordered    ondansetron (ZOFRAN) 4 MG tablet  Every 6 hours        08/11/23 2313              Wynetta Fines, MD 08/12/23 [phone]

## 2023-08-14 ENCOUNTER — Other Ambulatory Visit: Payer: Self-pay

## 2023-08-14 ENCOUNTER — Inpatient Hospital Stay (HOSPITAL_COMMUNITY)

## 2023-08-14 ENCOUNTER — Inpatient Hospital Stay (HOSPITAL_COMMUNITY)
Admission: EM | Admit: 2023-08-14 | Discharge: 2023-08-18 | DRG: 374 | Disposition: A | Discharge status: Home / Self Care | Attending: Family Medicine | Admitting: Family Medicine

## 2023-08-14 ENCOUNTER — Telehealth: Payer: Self-pay

## 2023-08-14 ENCOUNTER — Emergency Department (HOSPITAL_COMMUNITY)

## 2023-08-14 DIAGNOSIS — D6481 Anemia due to antineoplastic chemotherapy: Secondary | ICD-10-CM | POA: Diagnosis present

## 2023-08-14 DIAGNOSIS — I480 Paroxysmal atrial fibrillation: Secondary | ICD-10-CM | POA: Diagnosis present

## 2023-08-14 DIAGNOSIS — Z79890 Hormone replacement therapy (postmenopausal): Secondary | ICD-10-CM | POA: Diagnosis not present

## 2023-08-14 DIAGNOSIS — R188 Other ascites: Secondary | ICD-10-CM | POA: Diagnosis present

## 2023-08-14 DIAGNOSIS — J9601 Acute respiratory failure with hypoxia: Secondary | ICD-10-CM | POA: Diagnosis not present

## 2023-08-14 DIAGNOSIS — Z86718 Personal history of other venous thrombosis and embolism: Secondary | ICD-10-CM | POA: Diagnosis not present

## 2023-08-14 DIAGNOSIS — J45909 Unspecified asthma, uncomplicated: Secondary | ICD-10-CM | POA: Diagnosis present

## 2023-08-14 DIAGNOSIS — K922 Gastrointestinal hemorrhage, unspecified: Principal | ICD-10-CM | POA: Diagnosis present

## 2023-08-14 DIAGNOSIS — Z86711 Personal history of pulmonary embolism: Secondary | ICD-10-CM

## 2023-08-14 DIAGNOSIS — D62 Acute posthemorrhagic anemia: Secondary | ICD-10-CM | POA: Diagnosis present

## 2023-08-14 DIAGNOSIS — K920 Hematemesis: Secondary | ICD-10-CM | POA: Diagnosis present

## 2023-08-14 DIAGNOSIS — R451 Restlessness and agitation: Secondary | ICD-10-CM | POA: Diagnosis not present

## 2023-08-14 DIAGNOSIS — R578 Other shock: Secondary | ICD-10-CM | POA: Diagnosis not present

## 2023-08-14 DIAGNOSIS — Z7951 Long term (current) use of inhaled steroids: Secondary | ICD-10-CM

## 2023-08-14 DIAGNOSIS — M4856XA Collapsed vertebra, not elsewhere classified, lumbar region, initial encounter for fracture: Secondary | ICD-10-CM | POA: Diagnosis present

## 2023-08-14 DIAGNOSIS — Z6832 Body mass index (BMI) 32.0-32.9, adult: Secondary | ICD-10-CM | POA: Diagnosis not present

## 2023-08-14 DIAGNOSIS — Z79899 Other long term (current) drug therapy: Secondary | ICD-10-CM | POA: Diagnosis not present

## 2023-08-14 DIAGNOSIS — T451X5A Adverse effect of antineoplastic and immunosuppressive drugs, initial encounter: Secondary | ICD-10-CM | POA: Diagnosis present

## 2023-08-14 DIAGNOSIS — C161 Malignant neoplasm of fundus of stomach: Secondary | ICD-10-CM | POA: Diagnosis not present

## 2023-08-14 DIAGNOSIS — J9600 Acute respiratory failure, unspecified whether with hypoxia or hypercapnia: Secondary | ICD-10-CM | POA: Diagnosis not present

## 2023-08-14 DIAGNOSIS — L439 Lichen planus, unspecified: Secondary | ICD-10-CM | POA: Diagnosis present

## 2023-08-14 DIAGNOSIS — Z91048 Other nonmedicinal substance allergy status: Secondary | ICD-10-CM

## 2023-08-14 DIAGNOSIS — Z833 Family history of diabetes mellitus: Secondary | ICD-10-CM

## 2023-08-14 DIAGNOSIS — G4730 Sleep apnea, unspecified: Secondary | ICD-10-CM | POA: Diagnosis present

## 2023-08-14 DIAGNOSIS — C162 Malignant neoplasm of body of stomach: Secondary | ICD-10-CM | POA: Diagnosis not present

## 2023-08-14 DIAGNOSIS — Z87891 Personal history of nicotine dependence: Secondary | ICD-10-CM

## 2023-08-14 DIAGNOSIS — L409 Psoriasis, unspecified: Secondary | ICD-10-CM | POA: Diagnosis present

## 2023-08-14 DIAGNOSIS — C786 Secondary malignant neoplasm of retroperitoneum and peritoneum: Secondary | ICD-10-CM | POA: Diagnosis present

## 2023-08-14 DIAGNOSIS — R571 Hypovolemic shock: Secondary | ICD-10-CM

## 2023-08-14 DIAGNOSIS — Z823 Family history of stroke: Secondary | ICD-10-CM

## 2023-08-14 DIAGNOSIS — Z7189 Other specified counseling: Secondary | ICD-10-CM | POA: Diagnosis not present

## 2023-08-14 DIAGNOSIS — D490 Neoplasm of unspecified behavior of digestive system: Secondary | ICD-10-CM | POA: Diagnosis not present

## 2023-08-14 DIAGNOSIS — C169 Malignant neoplasm of stomach, unspecified: Secondary | ICD-10-CM | POA: Diagnosis present

## 2023-08-14 DIAGNOSIS — Z888 Allergy status to other drugs, medicaments and biological substances status: Secondary | ICD-10-CM

## 2023-08-14 DIAGNOSIS — E440 Moderate protein-calorie malnutrition: Secondary | ICD-10-CM | POA: Diagnosis present

## 2023-08-14 DIAGNOSIS — K219 Gastro-esophageal reflux disease without esophagitis: Secondary | ICD-10-CM | POA: Diagnosis present

## 2023-08-14 DIAGNOSIS — E039 Hypothyroidism, unspecified: Secondary | ICD-10-CM | POA: Diagnosis present

## 2023-08-14 DIAGNOSIS — K259 Gastric ulcer, unspecified as acute or chronic, without hemorrhage or perforation: Secondary | ICD-10-CM | POA: Diagnosis not present

## 2023-08-14 DIAGNOSIS — Z9221 Personal history of antineoplastic chemotherapy: Secondary | ICD-10-CM

## 2023-08-14 DIAGNOSIS — Z806 Family history of leukemia: Secondary | ICD-10-CM

## 2023-08-14 LAB — PREPARE RBC (CROSSMATCH)

## 2023-08-14 LAB — CBC WITH DIFFERENTIAL/PLATELET
Abs Immature Granulocytes: 0.07 10*3/uL (ref 0.00–0.07)
Basophils Absolute: 0.1 10*3/uL (ref 0.0–0.1)
Basophils Relative: 1 %
Eosinophils Absolute: 0.1 10*3/uL (ref 0.0–0.5)
Eosinophils Relative: 1 %
HCT: 33.7 % — ABNORMAL LOW (ref 39.0–52.0)
Hemoglobin: 11.1 g/dL — ABNORMAL LOW (ref 13.0–17.0)
Immature Granulocytes: 1 %
Lymphocytes Relative: 11 %
Lymphs Abs: 1.4 10*3/uL (ref 0.7–4.0)
MCH: 32.3 pg (ref 26.0–34.0)
MCHC: 32.9 g/dL (ref 30.0–36.0)
MCV: 98 fL (ref 80.0–100.0)
Monocytes Absolute: 0.9 10*3/uL (ref 0.1–1.0)
Monocytes Relative: 7 %
Neutro Abs: 10.3 10*3/uL — ABNORMAL HIGH (ref 1.7–7.7)
Neutrophils Relative %: 79 %
Platelets: 253 10*3/uL (ref 150–400)
RBC: 3.44 MIL/uL — ABNORMAL LOW (ref 4.22–5.81)
RDW: 19 % — ABNORMAL HIGH (ref 11.5–15.5)
WBC: 12.9 10*3/uL — ABNORMAL HIGH (ref 4.0–10.5)
nRBC: 0 % (ref 0.0–0.2)

## 2023-08-14 LAB — COMPREHENSIVE METABOLIC PANEL WITH GFR
ALT: 11 U/L (ref 0–44)
AST: 15 U/L (ref 15–41)
Albumin: 3 g/dL — ABNORMAL LOW (ref 3.5–5.0)
Alkaline Phosphatase: 54 U/L (ref 38–126)
Anion gap: 10 (ref 5–15)
BUN: 32 mg/dL — ABNORMAL HIGH (ref 8–23)
CO2: 21 mmol/L — ABNORMAL LOW (ref 22–32)
Calcium: 8.8 mg/dL — ABNORMAL LOW (ref 8.9–10.3)
Chloride: 104 mmol/L (ref 98–111)
Creatinine, Ser: 1.24 mg/dL (ref 0.61–1.24)
GFR, Estimated: >60 mL/min (ref >60)
Glucose, Bld: 139 mg/dL — ABNORMAL HIGH (ref 70–99)
Potassium: 4.6 mmol/L (ref 3.5–5.1)
Sodium: 135 mmol/L (ref 135–145)
Total Bilirubin: 0.8 mg/dL (ref 0.0–1.2)
Total Protein: 6.3 g/dL — ABNORMAL LOW (ref 6.5–8.1)

## 2023-08-14 LAB — LIPASE, BLOOD: Lipase: 23 U/L (ref 11–51)

## 2023-08-14 MED ORDER — SODIUM CHLORIDE 0.9 % IV BOLUS
1000.0000 mL | Freq: Once | INTRAVENOUS | Status: AC
  Administered 2023-08-14: 1000 mL via INTRAVENOUS

## 2023-08-14 MED ORDER — ONDANSETRON HCL 4 MG/2ML IJ SOLN
4.0000 mg | Freq: Once | INTRAMUSCULAR | Status: AC
  Administered 2023-08-14: 4 mg via INTRAVENOUS
  Filled 2023-08-14: qty 2

## 2023-08-14 MED ORDER — FENTANYL CITRATE PF 50 MCG/ML IJ SOSY: 50.0000 ug | PREFILLED_SYRINGE | Freq: Once | INTRAMUSCULAR | Status: DC

## 2023-08-14 MED ORDER — DOCUSATE SODIUM 50 MG/5ML PO LIQD: 100.0000 mg | Freq: Two times a day (BID) | ORAL | Status: DC

## 2023-08-14 MED ORDER — POLYETHYLENE GLYCOL 3350 17 G PO PACK: 17.0000 g | PACK | Freq: Every day | ORAL | Status: DC

## 2023-08-14 MED ORDER — SUCCINYLCHOLINE CHLORIDE 200 MG/10ML IV SOSY
PREFILLED_SYRINGE | INTRAVENOUS | Status: AC
  Filled 2023-08-14: qty 10

## 2023-08-14 MED ORDER — IOHEXOL 350 MG/ML SOLN
100.0000 mL | Freq: Once | INTRAVENOUS | Status: AC | PRN
Start: 2023-08-14 — End: 2023-08-14
  Administered 2023-08-14: 100 mL via INTRAVENOUS

## 2023-08-14 MED ORDER — ROCURONIUM BROMIDE 10 MG/ML (PF) SYRINGE
PREFILLED_SYRINGE | INTRAVENOUS | Status: AC
  Filled 2023-08-14: qty 10

## 2023-08-14 MED ORDER — ETOMIDATE 2 MG/ML IV SOLN
INTRAVENOUS | Status: AC | PRN
  Administered 2023-08-14: 20 mg via INTRAVENOUS

## 2023-08-14 MED ORDER — ROCURONIUM BROMIDE 10 MG/ML (PF) SYRINGE
PREFILLED_SYRINGE | INTRAVENOUS | Status: AC | PRN
  Administered 2023-08-14: 100 mg via INTRAVENOUS

## 2023-08-14 MED ORDER — ACETAMINOPHEN 325 MG PO TABS
650.0000 mg | ORAL_TABLET | Freq: Once | ORAL | Status: AC
  Administered 2023-08-14: 650 mg via ORAL
  Filled 2023-08-14: qty 2

## 2023-08-14 MED ORDER — POLYETHYLENE GLYCOL 3350 17 G PO PACK: 17.0000 g | PACK | Freq: Every day | ORAL | Status: DC | PRN

## 2023-08-14 MED ORDER — FENTANYL 2500MCG IN NS 250ML (10MCG/ML) PREMIX INFUSION: 50.0000 ug/h | INTRAVENOUS | Status: DC

## 2023-08-14 MED ORDER — SODIUM CHLORIDE 0.9 % IV SOLN
1.0000 g | Freq: Once | INTRAVENOUS | Status: AC
  Administered 2023-08-14: 1 g via INTRAVENOUS
  Filled 2023-08-14: qty 10

## 2023-08-14 MED ORDER — PROTAMINE SULFATE 10 MG/ML IV SOLN
50.0000 mg | Freq: Once | INTRAVENOUS | Status: AC
  Administered 2023-08-14: 50 mg via INTRAVENOUS
  Filled 2023-08-14: qty 5

## 2023-08-14 MED ORDER — ETOMIDATE 2 MG/ML IV SOLN
INTRAVENOUS | Status: AC
Start: 2023-08-14 — End: 2023-08-15
  Filled 2023-08-14: qty 20

## 2023-08-14 MED ORDER — PROTAMINE SULFATE 10 MG/ML IV SOLN: 50.0000 mg | Freq: Once | INTRAVENOUS | Status: DC

## 2023-08-14 MED ORDER — PANTOPRAZOLE SODIUM 40 MG IV SOLR
80.0000 mg | Freq: Once | INTRAVENOUS | Status: AC
  Administered 2023-08-14: 80 mg via INTRAVENOUS
  Filled 2023-08-14: qty 20

## 2023-08-14 MED ORDER — OCTREOTIDE LOAD VIA INFUSION
50.0000 ug | Freq: Once | INTRAVENOUS | Status: AC
  Administered 2023-08-14: 50 ug via INTRAVENOUS
  Filled 2023-08-14: qty 25

## 2023-08-14 MED ORDER — DOCUSATE SODIUM 50 MG/5ML PO LIQD: 100.0000 mg | Freq: Two times a day (BID) | ORAL | Status: DC | PRN

## 2023-08-14 MED ORDER — PANTOPRAZOLE SODIUM 40 MG IV SOLR
40.0000 mg | Freq: Two times a day (BID) | INTRAVENOUS | Status: DC
  Administered 2023-08-15 – 2023-08-18 (×7): 40 mg via INTRAVENOUS
  Filled 2023-08-14 (×7): qty 10

## 2023-08-14 MED ORDER — SODIUM CHLORIDE 0.9% IV SOLUTION: Freq: Once | INTRAVENOUS | Status: DC

## 2023-08-14 MED ORDER — SODIUM CHLORIDE 0.9 % IV SOLN: INTRAVENOUS | Status: DC

## 2023-08-14 MED ORDER — CHLORHEXIDINE GLUCONATE CLOTH 2 % EX PADS
6.0000 | MEDICATED_PAD | Freq: Every day | CUTANEOUS | Status: DC
  Administered 2023-08-15: 6 via TOPICAL

## 2023-08-14 MED ORDER — SODIUM CHLORIDE 0.9 % IV SOLN
50.0000 ug/h | INTRAVENOUS | Status: DC
Start: 2023-08-14 — End: 2023-08-15
  Administered 2023-08-14 – 2023-08-15 (×2): 50 ug/h via INTRAVENOUS
  Filled 2023-08-14 (×3): qty 1

## 2023-08-14 MED ORDER — ONDANSETRON HCL 4 MG/2ML IJ SOLN: 4.0000 mg | Freq: Four times a day (QID) | INTRAMUSCULAR | Status: DC | PRN

## 2023-08-14 MED ORDER — PROPOFOL 1000 MG/100ML IV EMUL
INTRAVENOUS | Status: AC
  Administered 2023-08-14: 5 ug/kg/min via INTRAVENOUS
  Filled 2023-08-14: qty 100

## 2023-08-14 MED ORDER — PROPOFOL 1000 MG/100ML IV EMUL
0.0000 ug/kg/min | INTRAVENOUS | Status: DC
  Administered 2023-08-15 (×2): 25 ug/kg/min via INTRAVENOUS
  Filled 2023-08-14: qty 100
  Filled 2023-08-14: qty 200

## 2023-08-14 MED ORDER — PROPOFOL 1000 MG/100ML IV EMUL: 5.0000 ug/kg/min | INTRAVENOUS | Status: DC

## 2023-08-14 MED ORDER — TRAMADOL HCL 50 MG PO TABS
50.0000 mg | ORAL_TABLET | Freq: Once | ORAL | Status: AC
  Administered 2023-08-14: 50 mg via ORAL
  Filled 2023-08-14: qty 1

## 2023-08-14 MED ORDER — FENTANYL BOLUS VIA INFUSION
50.0000 ug | INTRAVENOUS | Status: DC | PRN
Start: 2023-08-14 — End: 2023-08-15

## 2023-08-14 MED ORDER — FENTANYL 2500MCG IN NS 250ML (10MCG/ML) PREMIX INFUSION
0.0000 ug/h | INTRAVENOUS | Status: DC
  Administered 2023-08-14: 25 ug/h via INTRAVENOUS
  Filled 2023-08-14: qty 250

## 2023-08-14 NOTE — Consult Note (Signed)
 Brief Interventional Radiology Consult Note  IR paged to review case for possible intervention.  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  GI consulted and have no endoscopic treatments to offer.   Patient was on lovenox which has now been reversed and he is undergoing transfusion of 2 units pRBCs.    I have reviewed this gentleman's CTA and there is no evidence of active bleeding.  Stomach appears diffusely abnormal c/w history of infiltrative gastric cancer.  Given no focal source to target and the diffuse nature of the disease process, there is not a target for embolization.  Diffuse embolization of the region would carry a risk of perforation which would be disastrous.   Recommend continued conservative care, serial H&H and transfusions as needed.   Consider palliative consultation.     Signed,  Sterling Big, MD

## 2023-08-14 NOTE — ED Provider Notes (Signed)
 [place] EMERGENCY DEPARTMENT AT [facility] Provider Note   CSN: [id number] Arrival date & time: 08/14/23  1614     History  Chief Complaint  Patient presents with   Abdominal Pain   Hematemesis    Todd Harrison is a 78 y.o. male.  With a history of gastric cancer on chemotherapy, atrial fibrillation and pulmonary embolism on Lovenox who presents to the ED for hematemesis.  Patient experienced nausea and 2 episodes of vomiting at home today.  There was concern for hematemesis as he noted a red color to the emesis.  No bloody bowel movements in the past week.  He called his oncology office who directed him to the ED for further evaluation.  Here in the ED he is still having some nausea but no abdominal pain chest pain shortness of breath.  Denies fevers chills.  Was recently seen in the ED a few days ago after fall and was discharged home.  Takes Lovenox as prescribed last dose around 10 AM today   Abdominal Pain      Home Medications Prior to Admission medications   Medication Sig Start Date End Date Taking? Authorizing Provider  famotidine (PEPCID) 40 MG tablet Take 40 mg by mouth daily. 07/29/23  Yes [provider]  acitretin (SORIATANE) 25 MG capsule Take 25 mg by mouth daily. 01/09/23   [provider]  albuterol (VENTOLIN HFA) 108 (90 Base) MCG/ACT inhaler TAKE 2 PUFFS BY MOUTH EVERY 6 HOURS AS NEEDED FOR WHEEZE OR SHORTNESS OF BREATH 03/22/23   Ladene Artist, MD  capecitabine (XELODA) 500 MG tablet Take 3 tablets (1500 mg) by mouth in AM and 2 tablets (1000 mg) in PM. Take with food. Take for 14 days, then hold for 7 days. Repeat every 21 days. Start cycle on 07/30/23 07/27/23   Ladene Artist, MD  clobetasol ointment (TEMOVATE) 0.05 % Apply 1 Application topically 2 (two) times daily. 01/02/23   [provider]  enoxaparin (LOVENOX) 120 MG/0.8ML injection Inject 0.8 mLs (120 mg total) into the skin every 12 (twelve) hours. 12/28/22    Ladene Artist, MD  fluticasone (FLONASE) 50 MCG/ACT nasal spray Place 1 spray into both nostrils daily as needed for allergies or rhinitis.    [provider]  fluticasone furoate-vilanterol (BREO ELLIPTA) 100-25 MCG/ACT AEPB Inhale 1 puff into the lungs daily. 07/03/22   Josephine Igo, DO  hydroxychloroquine (PLAQUENIL) 200 MG tablet Take 200 mg by mouth 2 (two) times daily. Patient not taking: Reported on 05/04/2023 06/17/21   [provider]  hydrOXYzine (ATARAX) 25 MG tablet Take 25 mg by mouth 3 (three) times daily as needed. 04/19/23   [provider]  levothyroxine (SYNTHROID) 112 MCG tablet Take 112 mcg by mouth daily before breakfast.    [provider]  lidocaine-prilocaine (EMLA) cream Apply 1 Application topically as needed (Apply 1 hour before coming to [facility] for accessing). 04/27/22   Ladene Artist, MD  loratadine (CLARITIN) 10 MG tablet Take 10 mg by mouth daily.    [provider]  magic mouthwash (nystatin, diphenhydrAMINE, alum & mag hydroxide) suspension mixture Swish and spit 5 mLs 4 (four) times daily as needed for mouth pain/sores Patient not taking: Reported on 05/25/2023 05/04/23   Rana Snare, NP  methylPREDNISolone (MEDROL DOSEPAK) 4 MG TBPK tablet Take by mouth daily at 6 (six) AM. Take 32 mg (8 tab) daily x 1 day, then 20 mg (5 tab) daily x  2 days, then 12 mg (3 tab) daily x 2 days, then 8 mg (2 tab) daily x 1 day and then stop Patient not taking: Reported on 05/25/2023 05/10/23   Ladene Artist, MD  metoprolol succinate (TOPROL-XL) 50 MG 24 hr tablet TAKE 1 TABLET BY MOUTH EVERY DAY 05/30/23   Lanier Prude, MD  omeprazole (PRILOSEC) 40 MG capsule Take 40 mg by mouth daily. 04/12/22   [provider]  ondansetron (ZOFRAN) 4 MG tablet Take 1 tablet (4 mg total) by mouth every 6 (six) hours. 08/11/23   Wynetta Fines, MD  ondansetron (ZOFRAN) 8 MG tablet Take 1 tablet (8 mg total) by mouth every 8  (eight) hours as needed for nausea or vomiting. Do not take within 72 hours of day 1 chemo 09/20/22   Rana Snare, NP  oxyCODONE-acetaminophen (PERCOCET/ROXICET) 5-325 MG tablet Take 1 tablet by mouth daily as needed for severe pain (Pain not relieved with tramadol). Patient not taking: Reported on 07/06/2023 02/15/23   Ladene Artist, MD  polyethylene glycol (MIRALAX / GLYCOLAX) 17 g packet Take 17 g by mouth daily.    [provider]  potassium chloride (KLOR-CON M10) 10 MEQ tablet Take 2 tablets (20 mEq total) by mouth daily. 06/15/23   Ladene Artist, MD  prochlorperazine (COMPAZINE) 10 MG tablet Take 1 tablet (10 mg total) by mouth every 6 (six) hours as needed for nausea or vomiting. Patient not taking: Reported on 07/06/2023 05/04/23   Rana Snare, NP  traMADol (ULTRAM) 50 MG tablet Take 1 tablet (50 mg total) by mouth every 6 (six) hours as needed for moderate pain. Patient not taking: Reported on 07/06/2023 01/17/23   Ladene Artist, MD  triamcinolone (KENALOG) 0.1 % Apply 1 application  topically daily as needed (lichen planus flare).    [provider]      Allergies    Gold and Mixed grasses    Review of Systems   Review of Systems  Gastrointestinal:  Positive for abdominal pain.    Physical Exam Updated Vital Signs BP (!) 138/108   Pulse (!) 109   Temp 97.9 F (36.6 C) (Oral)   Resp 20   SpO2 98%  Physical Exam Vitals and nursing note reviewed.  HENT:     Head: Normocephalic and atraumatic.  Eyes:     Pupils: Pupils are equal, round, and reactive to light.  Cardiovascular:     Rate and Rhythm: Normal rate and regular rhythm.  Pulmonary:     Effort: Pulmonary effort is normal.     Breath sounds: Normal breath sounds.  Abdominal:     Palpations: Abdomen is soft.     Tenderness: There is no abdominal tenderness.  Skin:    General: Skin is warm and dry.  Neurological:     Mental Status: He is alert.  Psychiatric:        Mood and Affect:  Mood normal.     ED Results / Procedures / Treatments   Labs (all labs ordered are listed, but only abnormal results are displayed) Labs Reviewed  COMPREHENSIVE METABOLIC PANEL WITH GFR - Abnormal; Notable for the following components:      Result Value   CO2 21 (*)    Glucose, Bld 139 (*)    BUN 32 (*)    Calcium 8.8 (*)    Total Protein 6.3 (*)    Albumin 3.0 (*)    All other components within normal limits  CBC  WITH DIFFERENTIAL/PLATELET - Abnormal; Notable for the following components:   WBC 12.9 (*)    RBC 3.44 (*)    Hemoglobin 11.1 (*)    HCT 33.7 (*)    RDW 19.0 (*)    Neutro Abs 10.3 (*)    All other components within normal limits  MRSA NEXT GEN BY PCR, NASAL  LIPASE, BLOOD  CBC WITH DIFFERENTIAL/PLATELET  TYPE AND SCREEN  PREPARE RBC (CROSSMATCH)  PREPARE RBC (CROSSMATCH)  PREPARE RBC (CROSSMATCH)  TYPE AND SCREEN    EKG None  Radiology CT Angio Abd/Pel W and/or Wo Contrast Result Date: 08/14/2023 CLINICAL DATA:  Lower GI bleeding, hematemesis, history of gastric cancer * Tracking Code: BO * EXAM: CTA ABDOMEN AND PELVIS WITHOUT AND WITH CONTRAST TECHNIQUE: Multidetector CT imaging of the abdomen and pelvis was performed using the standard protocol during bolus administration of intravenous contrast. Multiplanar reconstructed images and MIPs were obtained and reviewed to evaluate the vascular anatomy. RADIATION DOSE REDUCTION: This exam was performed according to the departmental dose-optimization program which includes automated exposure control, adjustment of the mA and/or kV according to patient size and/or use of iterative reconstruction technique. CONTRAST:  OMNIPAQUE IOHEXOL 350 MG/ML SOLN COMPARISON:  CT chest abdomen pelvis, 05/21/2023 FINDINGS: VASCULAR Normal contour and caliber of the abdominal aorta. Moderate mixed calcific atherosclerosis. No evidence of aneurysm, dissection, or other acute aortic pathology. Standard branching pattern of the  abdominal aorta with solitary bilateral renal arteries. Review of the MIP images confirms the above findings. NON-VASCULAR Lower Chest: Trace bilateral pleural effusions. Coronary artery calcifications. Hepatobiliary: No solid liver abnormality is seen. No gallstones, gallbladder wall thickening, or biliary dilatation. Pancreas: Unremarkable. No pancreatic ductal dilatation or surrounding inflammatory changes. Spleen: Normal in size without significant abnormality. Adrenals/Urinary Tract: Adrenal glands are unremarkable. Kidneys are normal, without renal calculi, solid lesion, or hydronephrosis. Bladder is unremarkable. Stomach/Bowel: Large volume of heterogeneous, hyperdense material in the gastric lumen, presumably admixed food and blood product in the reported setting of hematemesis. Prominent enhancement of the gastric body, which is in keeping with known primary gastric malignancy and/or varices, without visualized extravasation into the bowel lumen (series 19, image 21). Appendix clearly visualized. No evidence of bowel wall thickening, distention, or inflammatory changes. Sigmoid diverticulosis. Lymphatic: No enlarged abdominal or pelvic lymph nodes. Reproductive: No mass or other significant abnormality. Other: No abdominal wall hernia. Injection granulomata throughout the superficial soft tissues of the low abdomen. Small volume ascites throughout the abdomen pelvis with diffuse peritoneal thickening as well as matting of the bowel and omentum, all findings similar to prior examination. Musculoskeletal: No acute osseous findings. IMPRESSION: 1. Large volume of heterogeneous, hyperdense material in the gastric lumen, presumably admixed food and blood product in the reported setting of hematemesis. 2. Prominent enhancement of the gastric body, which is in keeping with known primary gastric malignancy and/or varices, without visualized extravasation into the bowel lumen. 3. Small volume ascites throughout the  abdomen pelvis with diffuse peritoneal thickening as well as matting of the bowel and omentum, all findings similar to prior examination and consistent with peritoneal carcinomatosis. 4. Descending and sigmoid diverticulosis without evidence of acute diverticulitis or diverticular hemorrhage. 5. Trace bilateral pleural effusions. 6. Coronary artery disease. Aortic Atherosclerosis (ICD10-I70.0). Electronically Signed   By: Jearld Lesch M.D.   On: 08/14/2023 21:42    Procedures .Critical Care  Performed by: Royanne Foots, DO Authorized by: Royanne Foots, DO   Critical care provider statement:    Critical care  time (minutes):  90   Critical care time was exclusive of:  Separately billable procedures and treating other patients and teaching time   Critical care was necessary to treat or prevent imminent or life-threatening deterioration of the following conditions:  Shock and circulatory failure   Critical care was time spent personally by me on the following activities:  Development of treatment plan with patient or surrogate, discussions with consultants, evaluation of patient's response to treatment, examination of patient, ordering and review of laboratory studies, ordering and review of radiographic studies, ordering and performing treatments and interventions, pulse oximetry, re-evaluation of patient's condition, review of old charts and obtaining history from patient or surrogate   I assumed direction of critical care for this patient from another provider in my specialty: no     Care discussed with: admitting provider   Comments:     Care discussed with ICU, GI on-call and interventional radiology Updated the patient's family at bedside on ED course and plan for transfer to ICU Procedure Name: Intubation Date/Time: 08/14/2023 10:00 PM  Performed by: Royanne Foots, DOPre-anesthesia Checklist: Patient identified, Patient being monitored, Emergency Drugs available, Timeout performed and  Suction available Oxygen Delivery Method: Non-rebreather mask Preoxygenation: Pre-oxygenation with 100% oxygen Induction Type: Rapid sequence Ventilation: Mask ventilation without difficulty Laryngoscope Size: Glidescope Grade View: Grade I Tube size: 8.0 mm Number of attempts: 1 Airway Equipment and Method: Rigid stylet and Video-laryngoscopy Placement Confirmation: ETT inserted through vocal cords under direct vision, CO2 detector and Breath sounds checked- equal and bilateral Secured at: 25 cm Tube secured with: ETT holder        Medications Ordered in ED Medications  0.9 %  sodium chloride infusion (Manually program via Guardrails IV Fluids) (has no administration in time range)  octreotide (SANDOSTATIN) 2 mcg/mL load via infusion 50 mcg (50 mcg Intravenous Bolus from Bag 08/14/23 2100)    And  octreotide (SANDOSTATIN) 500 mcg in sodium chloride 0.9 % 250 mL (2 mcg/mL) infusion (50 mcg/hr Intravenous New Bag/Given 08/14/23 2100)  etomidate (AMIDATE) 2 MG/ML injection (has no administration in time range)  rocuronium (ZEMURON) 100 MG/10ML injection (has no administration in time range)  Chlorhexidine Gluconate Cloth 2 % PADS 6 each (has no administration in time range)  pantoprazole (PROTONIX) injection 40 mg (has no administration in time range)  sodium chloride 0.9 % bolus 1,000 mL (0 mLs Intravenous Stopped 08/14/23 1912)  ondansetron (ZOFRAN) injection 4 mg (4 mg Intravenous Given 08/14/23 1717)  iohexol (OMNIPAQUE) 350 MG/ML injection 100 mL (100 mLs Intravenous Contrast Given 08/14/23 1849)  ondansetron (ZOFRAN) injection 4 mg (4 mg Intravenous Given 08/14/23 1908)  acetaminophen (TYLENOL) tablet 650 mg (650 mg Oral Given 08/14/23 1911)  traMADol (ULTRAM) tablet 50 mg (50 mg Oral Given 08/14/23 1911)  sodium chloride 0.9 % bolus 1,000 mL (1,000 mLs Intravenous New Bag/Given 08/14/23 2022)  cefTRIAXone (ROCEPHIN) 1 g in sodium chloride 0.9 % 100 mL IVPB (1 g Intravenous New Bag/Given 08/14/23  2222)  protamine 50 mg in dextrose 5 % 50 mL IVPB (0 mg Intravenous Stopped 08/14/23 2058)  pantoprazole (PROTONIX) injection 80 mg (80 mg Intravenous Given 08/14/23 2108)  etomidate (AMIDATE) injection (20 mg Intravenous Given 08/14/23 2140)  rocuronium (ZEMURON) injection (100 mg Intravenous Given 08/14/23 2141)    ED Course/ Medical Decision Making/ A&P Clinical Course as of 08/14/23 2340  Tue Aug 14, 2023  2010 Nursing staff reports patient had another episode of large-volume hematemesis here in the ED.  He is  now hypotensive.  Will initiate blood transfusion along with PPI, ceftriaxone, octreotide.  Will provide IV fluids for pressure support while awaiting blood transfusion  Will reverse Lovenox with protamine under direction of pharmacist on-call  Will page GI doctor on-call and plan for admission to ICU [MP]  2035 Discussed with Dr. Dulce Sellar (GI on-call).  Unfortunately, given that the most likely site of bleeding is patient's known gastric cancer GI has no intervention to offer at this time.  Agrees with plan for admission and Lovenox reversal [MP]  2040 Discussed with ICU attending on call Dr.Alva who will evaluate patient in the ED and admit to ICU.  He recommends reaching out to IR to see if there is any intervention they might be able to offer.  Paged IR [MP]  2043 Discussed with Dr. Meredeth Ide (IR) who will review patient's CTA and call back [MP]  2200 Giving recurrent hemoptysis the decision was made to intubate patient for airway protection after discussion with him and his family.  Intubation with RSI medications etomidate rocuronium.  Fentanyl and propofol for postintubation sedation.  OG tube was placed with bloody return.  OG tube and ET tube confirmed with chest x-ray. [MP]  2237 Critical care transport team has arrived here in the ED.  Will order his third unit of RBCs prior to transfer [MP]    Clinical Course User Index [MP] Royanne Foots, DO                                  Medical Decision Making 78 year old male with history as above presenting given concern for hematemesis.  On Lovenox with last dose this morning.  Afebrile and well-appearing on my initial assessment.  Normotensive.  Considering history of gastric cancer there is concern for upper GI bleeding.  Will obtain laboratory workup including type and screen, provide IV fluids for rehydration and Zofran for nausea.  Will evaluate with CTA abdomen pelvis to look for brisk bleeding.  Anticipate admission with likely GI consult  Amount and/or Complexity of Data Reviewed Labs: ordered. Radiology: ordered.  Risk OTC drugs. Prescription drug management. Decision regarding hospitalization.           Final Clinical Impression(s) / ED Diagnoses Final diagnoses:  Acute upper GI bleed  Hematemesis with nausea  Malignant neoplasm of stomach, unspecified location [facility])  Hemorrhagic shock [facility])    Rx / DC Orders ED Discharge Orders     None         Royanne Foots, DO 08/14/23 2340

## 2023-08-14 NOTE — Telephone Encounter (Signed)
 A patient's daughter contacted [place] and left a message reporting that her father has vomited blood, and they are en route to the emergency department. I followed up with the patient's daughter to acknowledge her message and confirmed that they should proceed to [facility] Emergency Department for evaluation.

## 2023-08-14 NOTE — Progress Notes (Signed)
 eLink Physician-Brief Progress Note Patient Name: Todd Harrison DOB: 06-Apr-1946 MRN: [id number]   Date of Service  08/14/2023  HPI/Events of Note  Patient admitted with advanced gastric cancer and GI bleeding.  eICU Interventions  New Patient Evaluation. Serial  H  &  H.        Thomasene Lot Jaylin Benzel 08/14/2023, 11:52 PM

## 2023-08-14 NOTE — ED Triage Notes (Signed)
 Patient comes in by family. Patient states he is vomiting blood started around 1330 today. He is on lovenox shots.

## 2023-08-14 NOTE — H&P (Signed)
 NAME:  Todd Harrison, MRN:  [id number], DOB:  [birth year], LOS: 0 ADMISSION DATE:  08/14/2023, CONSULTATION DATE:  08/14/2023  REFERRING MD:  Elayne Snare EDP , CHIEF COMPLAINT: Hematemesis  History of Present Illness:  78 year old man with gastric cancer metastatic to abdomen, on Lovenox for VTE presents with hematemesis. He is on Xeloda chemotherapy every 3 weeks and follows with Dr. Truett Perna ED visit on 4/5 - he had a ground-level fall and hit the back of his head on the floor, GCS 15 on arrival Head CT and C-spine was negative Initial labs significant for hemoglobin of 9.1, baseline 9.9, mild leukocytosis, platelets 253, BUN/creatinine 32/1.2 .  Initially normotensive but then developed mild hypotension Last dose of Lovenox was 10 AM today CT angiogram abdomen/pelvis GI was consulted   Pertinent  Medical History  Gastric cancer with abdominal carcinomatosis presenting 04/2022, large mass in gastric fundus on EGD  Atrial fibrillation Bilateral pulm embolism/extensive bilateral lower extremity DVT 07/2022 on Lovenox, changed to once daily dosing 05/2023 Hypothyroidism Lumbar compression fracture  Significant Hospital Events: Including procedures, antibiotic start and stop dates in addition to other pertinent events     Interim History / Subjective:  ***  Objective   Blood pressure 98/75, pulse 73, temperature 97.8 F (36.6 C), resp. rate 19, SpO2 100%.        Intake/Output Summary (Last 24 hours) at 08/14/2023 2045 Last data filed at 08/14/2023 1912 Gross per 24 hour  Intake 1000 ml  Output --  Net 1000 ml   There were no vitals filed for this visit.  Examination: General: *** HENT: *** Lungs: *** Cardiovascular: *** Abdomen: *** Extremities: *** Neuro: *** GU: ***  Resolved Hospital Problem list     Assessment & Plan:  Upper GI bleed Hemorrhagic shock Metastatic gastric cancer with primary large mass in the gastric fundus likely source of bleeding  -GI does not  feel that EGD would be therapeutic. -Will consult IR to see if he would be a candidate for embolization  Best Practice (right click and "Reselect all SmartList Selections" daily)   Diet/type: NPO DVT prophylaxis SCD Pressure ulcer(s): N/A GI prophylaxis: PPI Lines: N/A Foley:  N/A Code Status:  full code Last date of multidisciplinary goals of care discussion [NA]  Labs   CBC: Recent Labs  Lab 08/11/23 2003 08/14/23 1657  WBC 8.5 12.9*  NEUTROABS 5.5 10.3*  HGB 11.9* 11.1*  HCT 35.6* 33.7*  MCV 95.7 98.0  PLT 203 253    Basic Metabolic Panel: Recent Labs  Lab 08/11/23 2003 08/14/23 1657  NA 135 135  K 3.9 4.6  CL 102 104  CO2 23 21*  GLUCOSE 104* 139*  BUN 13 32*  CREATININE 1.21 1.24  CALCIUM 9.0 8.8*   GFR: Estimated Creatinine Clearance: 66 mL/min (by C-G formula based on SCr of 1.24 mg/dL). Recent Labs  Lab 08/11/23 2003 08/14/23 1657  WBC 8.5 12.9*    Liver Function Tests: Recent Labs  Lab 08/11/23 2003 08/14/23 1657  AST 19 15  ALT 12 11  ALKPHOS 55 54  BILITOT 0.8 0.8  PROT 5.9* 6.3*  ALBUMIN 3.1* 3.0*   Recent Labs  Lab 08/14/23 1657  LIPASE 23   No results for input(s): "AMMONIA" in the last 168 hours.  ABG    Component Value Date/Time   TCO2 22 04/12/2020 1034     Coagulation Profile: No results for input(s): "INR", "PROTIME" in the last 168 hours.  Cardiac Enzymes: No results for input(s): "CKTOTAL", "  CKMB", "CKMBINDEX", "TROPONINI" in the last 168 hours.  HbA1C: No results found for: "HGBA1C"  CBG: No results for input(s): "GLUCAP" in the last 168 hours.  Review of Systems:   ***  Past Medical History:  He,  has a past medical history of A-fib (HCC), Asthma, Cataract, Gastric cancer (HCC), GERD (gastroesophageal reflux disease), Lichen planus, Methotrexate, long term, current use, Psoriasis, Sleep apnea, Thyroid disease, and Urticaria.   Surgical History:   Past Surgical History:  Procedure Laterality Date    CARDIOVERSION N/A 02/23/2020   Procedure: CARDIOVERSION;  Surgeon: Christell Constant, MD;  Location: [facility] ENDOSCOPY;  Service: Cardiovascular;  Laterality: N/A;   CARDIOVERSION N/A 04/12/2020   Procedure: CARDIOVERSION;  Surgeon: Jodelle Red, MD;  Location: [facility] ENDOSCOPY;  Service: Cardiovascular;  Laterality: N/A;   CATARACT EXTRACTION Left    2019   IR IMAGING GUIDED PORT INSERTION  05/03/2022   TONSILLECTOMY       Social History:   reports that he quit smoking about 43 years ago. His smoking use included cigarettes. He has never used smokeless tobacco. He reports current alcohol use of about 1.0 standard drink of alcohol per week. He reports that he does not use drugs.   Family History:  His family history includes Diabetes in his mother; Leukemia in his maternal great-grandmother; Stroke in his mother. There is no history of Allergic rhinitis, Asthma, Eczema, Urticaria, Colon cancer, Esophageal cancer, or Stomach cancer.   Allergies Allergies  Allergen Reactions   Gold Itching   Mixed Grasses     unknown     Home Medications  Prior to Admission medications   Medication Sig Start Date End Date Taking? Authorizing Provider  acitretin (SORIATANE) 25 MG capsule Take 25 mg by mouth daily. 01/09/23   [provider]  albuterol (VENTOLIN HFA) 108 (90 Base) MCG/ACT inhaler TAKE 2 PUFFS BY MOUTH EVERY 6 HOURS AS NEEDED FOR WHEEZE OR SHORTNESS OF BREATH 03/22/23   Ladene Artist, MD  capecitabine (XELODA) 500 MG tablet Take 3 tablets (1500 mg) by mouth in AM and 2 tablets (1000 mg) in PM. Take with food. Take for 14 days, then hold for 7 days. Repeat every 21 days. Start cycle on 07/30/23 07/27/23   Ladene Artist, MD  clobetasol ointment (TEMOVATE) 0.05 % Apply 1 Application topically 2 (two) times daily. 01/02/23   [provider]  enoxaparin (LOVENOX) 120 MG/0.8ML injection Inject 0.8 mLs (120 mg total) into the skin every 12 (twelve) hours. 12/28/22    Ladene Artist, MD  fluticasone (FLONASE) 50 MCG/ACT nasal spray Place 1 spray into both nostrils daily as needed for allergies or rhinitis.    [provider]  fluticasone furoate-vilanterol (BREO ELLIPTA) 100-25 MCG/ACT AEPB Inhale 1 puff into the lungs daily. 07/03/22   Josephine Igo, DO  hydroxychloroquine (PLAQUENIL) 200 MG tablet Take 200 mg by mouth 2 (two) times daily. Patient not taking: Reported on 05/04/2023 06/17/21   [provider]  hydrOXYzine (ATARAX) 25 MG tablet Take 25 mg by mouth 3 (three) times daily as needed. 04/19/23   [provider]  levothyroxine (SYNTHROID) 112 MCG tablet Take 112 mcg by mouth daily before breakfast.    [provider]  lidocaine-prilocaine (EMLA) cream Apply 1 Application topically as needed (Apply 1 hour before coming to [facility] for accessing). 04/27/22   Ladene Artist, MD  loratadine (CLARITIN) 10 MG tablet Take 10 mg by mouth daily.    [provider]  magic  mouthwash (nystatin, diphenhydrAMINE, alum & mag hydroxide) suspension mixture Swish and spit 5 mLs 4 (four) times daily as needed for mouth pain/sores Patient not taking: Reported on 05/25/2023 05/04/23   Rana Snare, NP  methylPREDNISolone (MEDROL DOSEPAK) 4 MG TBPK tablet Take by mouth daily at 6 (six) AM. Take 32 mg (8 tab) daily x 1 day, then 20 mg (5 tab) daily x 2 days, then 12 mg (3 tab) daily x 2 days, then 8 mg (2 tab) daily x 1 day and then stop Patient not taking: Reported on 05/25/2023 05/10/23   Ladene Artist, MD  metoprolol succinate (TOPROL-XL) 50 MG 24 hr tablet TAKE 1 TABLET BY MOUTH EVERY DAY 05/30/23   Lanier Prude, MD  omeprazole (PRILOSEC) 40 MG capsule Take 40 mg by mouth daily. 04/12/22   [provider]  ondansetron (ZOFRAN) 4 MG tablet Take 1 tablet (4 mg total) by mouth every 6 (six) hours. 08/11/23   Wynetta Fines, MD  ondansetron (ZOFRAN) 8 MG tablet Take 1 tablet (8 mg total) by mouth every 8  (eight) hours as needed for nausea or vomiting. Do not take within 72 hours of day 1 chemo 09/20/22   Rana Snare, NP  oxyCODONE-acetaminophen (PERCOCET/ROXICET) 5-325 MG tablet Take 1 tablet by mouth daily as needed for severe pain (Pain not relieved with tramadol). Patient not taking: Reported on 07/06/2023 02/15/23   Ladene Artist, MD  polyethylene glycol (MIRALAX / GLYCOLAX) 17 g packet Take 17 g by mouth daily.    [provider]  potassium chloride (KLOR-CON M10) 10 MEQ tablet Take 2 tablets (20 mEq total) by mouth daily. 06/15/23   Ladene Artist, MD  prochlorperazine (COMPAZINE) 10 MG tablet Take 1 tablet (10 mg total) by mouth every 6 (six) hours as needed for nausea or vomiting. Patient not taking: Reported on 07/06/2023 05/04/23   Rana Snare, NP  traMADol (ULTRAM) 50 MG tablet Take 1 tablet (50 mg total) by mouth every 6 (six) hours as needed for moderate pain. Patient not taking: Reported on 07/06/2023 01/17/23   Ladene Artist, MD  triamcinolone (KENALOG) 0.1 % Apply 1 application  topically daily as needed (lichen planus flare).    [provider]     Critical care time: ***

## 2023-08-15 ENCOUNTER — Inpatient Hospital Stay: Admitting: Oncology

## 2023-08-15 ENCOUNTER — Inpatient Hospital Stay

## 2023-08-15 ENCOUNTER — Encounter (HOSPITAL_COMMUNITY): Payer: Self-pay | Admitting: Pulmonary Disease

## 2023-08-15 ENCOUNTER — Other Ambulatory Visit: Payer: Self-pay

## 2023-08-15 DIAGNOSIS — C169 Malignant neoplasm of stomach, unspecified: Secondary | ICD-10-CM

## 2023-08-15 DIAGNOSIS — K922 Gastrointestinal hemorrhage, unspecified: Secondary | ICD-10-CM | POA: Diagnosis not present

## 2023-08-15 DIAGNOSIS — D62 Acute posthemorrhagic anemia: Secondary | ICD-10-CM | POA: Diagnosis not present

## 2023-08-15 DIAGNOSIS — C786 Secondary malignant neoplasm of retroperitoneum and peritoneum: Secondary | ICD-10-CM

## 2023-08-15 DIAGNOSIS — R571 Hypovolemic shock: Secondary | ICD-10-CM | POA: Diagnosis not present

## 2023-08-15 DIAGNOSIS — I480 Paroxysmal atrial fibrillation: Secondary | ICD-10-CM

## 2023-08-15 DIAGNOSIS — Z7189 Other specified counseling: Secondary | ICD-10-CM

## 2023-08-15 DIAGNOSIS — R578 Other shock: Secondary | ICD-10-CM

## 2023-08-15 LAB — TYPE AND SCREEN
ABO/RH(D): O POS
Antibody Screen: NEGATIVE
Unit division: 0
Unit division: 0
Unit division: 0

## 2023-08-15 LAB — BASIC METABOLIC PANEL WITH GFR
Anion gap: 9 (ref 5–15)
BUN: 31 mg/dL — ABNORMAL HIGH (ref 8–23)
CO2: 21 mmol/L — ABNORMAL LOW (ref 22–32)
Calcium: 7.6 mg/dL — ABNORMAL LOW (ref 8.9–10.3)
Chloride: 107 mmol/L (ref 98–111)
Creatinine, Ser: 1.12 mg/dL (ref 0.61–1.24)
GFR, Estimated: >60 mL/min (ref >60)
Glucose, Bld: 135 mg/dL — ABNORMAL HIGH (ref 70–99)
Potassium: 4.3 mmol/L (ref 3.5–5.1)
Sodium: 137 mmol/L (ref 135–145)

## 2023-08-15 LAB — CBC
HCT: 32 % — ABNORMAL LOW (ref 39.0–52.0)
Hemoglobin: 10.8 g/dL — ABNORMAL LOW (ref 13.0–17.0)
MCH: 31.1 pg (ref 26.0–34.0)
MCHC: 33.8 g/dL (ref 30.0–36.0)
MCV: 92.2 fL (ref 80.0–100.0)
Platelets: 230 10*3/uL (ref 150–400)
RBC: 3.47 MIL/uL — ABNORMAL LOW (ref 4.22–5.81)
RDW: 18.9 % — ABNORMAL HIGH (ref 11.5–15.5)
WBC: 9.7 10*3/uL (ref 4.0–10.5)
nRBC: 0.2 % (ref 0.0–0.2)

## 2023-08-15 LAB — BPAM RBC
Blood Product Expiration Date: 202505062359
Blood Product Expiration Date: 202505072359
Blood Product Expiration Date: 202505072359
ISSUE DATE / TIME: 202504082031
ISSUE DATE / TIME: 202504082110
ISSUE DATE / TIME: 202504082248
Unit Type and Rh: 5100
Unit Type and Rh: 5100
Unit Type and Rh: 5100

## 2023-08-15 LAB — CBC WITH DIFFERENTIAL/PLATELET
Abs Immature Granulocytes: 0.08 10*3/uL — ABNORMAL HIGH (ref 0.00–0.07)
Basophils Absolute: 0.1 10*3/uL (ref 0.0–0.1)
Basophils Relative: 1 %
Eosinophils Absolute: 0 10*3/uL (ref 0.0–0.5)
Eosinophils Relative: 0 %
HCT: 31.7 % — ABNORMAL LOW (ref 39.0–52.0)
Hemoglobin: 10.5 g/dL — ABNORMAL LOW (ref 13.0–17.0)
Immature Granulocytes: 1 %
Lymphocytes Relative: 12 %
Lymphs Abs: 1.3 10*3/uL (ref 0.7–4.0)
MCH: 31.3 pg (ref 26.0–34.0)
MCHC: 33.1 g/dL (ref 30.0–36.0)
MCV: 94.6 fL (ref 80.0–100.0)
Monocytes Absolute: 0.8 10*3/uL (ref 0.1–1.0)
Monocytes Relative: 7 %
Neutro Abs: 8.5 10*3/uL — ABNORMAL HIGH (ref 1.7–7.7)
Neutrophils Relative %: 79 %
Platelets: 217 10*3/uL (ref 150–400)
RBC: 3.35 MIL/uL — ABNORMAL LOW (ref 4.22–5.81)
RDW: 18.7 % — ABNORMAL HIGH (ref 11.5–15.5)
WBC: 10.8 10*3/uL — ABNORMAL HIGH (ref 4.0–10.5)
nRBC: 0 % (ref 0.0–0.2)

## 2023-08-15 LAB — PHOSPHORUS: Phosphorus: 3.7 mg/dL (ref 2.5–4.6)

## 2023-08-15 LAB — POCT I-STAT 7, (LYTES, BLD GAS, ICA,H+H)
Acid-base deficit: 4 mmol/L — ABNORMAL HIGH (ref 0.0–2.0)
Bicarbonate: 20.1 mmol/L (ref 20.0–28.0)
Calcium, Ion: 1.13 mmol/L — ABNORMAL LOW (ref 1.15–1.40)
HCT: 30 % — ABNORMAL LOW (ref 39.0–52.0)
Hemoglobin: 10.2 g/dL — ABNORMAL LOW (ref 13.0–17.0)
O2 Saturation: 100 %
Patient temperature: 36.9
Potassium: 4.4 mmol/L (ref 3.5–5.1)
Sodium: 138 mmol/L (ref 135–145)
TCO2: 21 mmol/L — ABNORMAL LOW (ref 22–32)
pCO2 arterial: 31 mmHg — ABNORMAL LOW (ref 32–48)
pH, Arterial: 7.418 (ref 7.35–7.45)
pO2, Arterial: 477 mmHg — ABNORMAL HIGH (ref 83–108)

## 2023-08-15 LAB — HEMOGLOBIN AND HEMATOCRIT, BLOOD
HCT: 31.4 % — ABNORMAL LOW (ref 39.0–52.0)
Hemoglobin: 10.6 g/dL — ABNORMAL LOW (ref 13.0–17.0)

## 2023-08-15 LAB — MRSA NEXT GEN BY PCR, NASAL: MRSA by PCR Next Gen: NOT DETECTED

## 2023-08-15 LAB — MAGNESIUM: Magnesium: 1.8 mg/dL (ref 1.7–2.4)

## 2023-08-15 LAB — TRIGLYCERIDES: Triglycerides: 168 mg/dL — ABNORMAL HIGH (ref <150)

## 2023-08-15 MED ORDER — POLYETHYLENE GLYCOL 3350 17 G PO PACK: 17.0000 g | PACK | Freq: Every day | ORAL | Status: DC | PRN

## 2023-08-15 MED ORDER — DOCUSATE SODIUM 50 MG/5ML PO LIQD: 100.0000 mg | Freq: Two times a day (BID) | ORAL | Status: DC | PRN

## 2023-08-15 MED ORDER — ORAL CARE MOUTH RINSE
15.0000 mL | OROMUCOSAL | Status: DC
  Administered 2023-08-15 (×4): 15 mL via OROMUCOSAL

## 2023-08-15 MED ORDER — MAGNESIUM SULFATE 2 GM/50ML IV SOLN
2.0000 g | Freq: Once | INTRAVENOUS | Status: AC
  Administered 2023-08-15: 2 g via INTRAVENOUS
  Filled 2023-08-15: qty 50

## 2023-08-15 MED ORDER — SODIUM CHLORIDE 0.9% FLUSH: 10.0000 mL | Freq: Two times a day (BID) | INTRAVENOUS | Status: DC

## 2023-08-15 MED ORDER — CHLORHEXIDINE GLUCONATE CLOTH 2 % EX PADS
6.0000 | MEDICATED_PAD | Freq: Every day | CUTANEOUS | Status: DC
  Administered 2023-08-15 – 2023-08-17 (×3): 6 via TOPICAL

## 2023-08-15 MED ORDER — ORAL CARE MOUTH RINSE: 15.0000 mL | OROMUCOSAL | Status: DC | PRN

## 2023-08-15 MED ORDER — ACETAMINOPHEN 160 MG/5ML PO SOLN
650.0000 mg | ORAL | Status: DC | PRN
  Filled 2023-08-15: qty 20.3

## 2023-08-15 MED ORDER — ENSURE ENLIVE PO LIQD
237.0000 mL | Freq: Two times a day (BID) | ORAL | Status: DC
Start: 2023-08-15 — End: 2023-08-18
  Administered 2023-08-15 – 2023-08-18 (×3): 237 mL via ORAL

## 2023-08-15 MED ORDER — SODIUM CHLORIDE 0.9 % IV SOLN: 2.0000 g | INTRAVENOUS | Status: DC

## 2023-08-15 MED ORDER — NOREPINEPHRINE 4 MG/250ML-% IV SOLN
0.0000 ug/min | INTRAVENOUS | Status: DC
  Administered 2023-08-15: 2 ug/min via INTRAVENOUS
  Filled 2023-08-15 (×2): qty 250

## 2023-08-15 MED ORDER — SODIUM CHLORIDE 0.9% FLUSH: 10.0000 mL | INTRAVENOUS | Status: DC | PRN

## 2023-08-15 NOTE — TOC Initial Note (Signed)
 Transition of Care [facility]) - Initial/Assessment Note    Patient Details  Name: Todd Harrison MRN: [id number] Date of Birth: Apr 21, 1946  [facility]) CM/SW Contact:    Lawerance Sabal, RN Phone Number: 08/15/2023, 5:07 PM  Clinical Narrative:                  Patient from home. Admitted for bloody emesis, on lovenox PTA.  Gastric cancer metastatic to abdomen.  Palliative consult today, desires for full scope, full code. TOC following for DC planning.  PT consult pending.    Barriers to Discharge: Continued Medical Work up   Patient Goals and CMS Choice            Expected Discharge Plan and Services   Discharge Planning Services: CM Consult   Living arrangements for the past 2 months: Single Family Home                                      Prior Living Arrangements/Services Living arrangements for the past 2 months: Single Family Home                     Activities of Daily Living   ADL Screening (condition at time of admission) Is the patient deaf or have difficulty hearing?: Yes Does the patient have difficulty seeing, even when wearing glasses/contacts?: Yes Does the patient have difficulty concentrating, remembering, or making decisions?: Yes  Permission Sought/Granted                  Emotional Assessment              Admission diagnosis:  Hematemesis [K92.0] UGI bleed [K92.2] Patient Active Problem List   Diagnosis Date Noted   Hemorrhagic shock (HCC) 08/15/2023   Hematemesis 08/14/2023   Acute upper GI bleed 08/14/2023   Malnutrition of moderate degree 07/25/2022   Hemoptysis 07/25/2022   Infarct of lung (HCC) 07/25/2022   Pulmonary embolus (HCC) 07/25/2022   Acute saddle pulmonary embolism with acute cor pulmonale (HCC) 07/24/2022   Paroxysmal atrial fibrillation (HCC) 07/24/2022   Lichen planus 07/24/2022   Malignant neoplasm of stomach (HCC) 04/26/2022   Aortic atherosclerosis (HCC) 04/17/2022   Abnormal  CT of the chest 12/16/2020   COVID-19 virus infection 11/25/2020   OSA (obstructive sleep apnea) 05/19/2020   Persistent atrial fibrillation (HCC)    Daytime hypersomnolence 02/16/2020   Tachycardia 01/22/2020   Atrial fibrillation with RVR (HCC) 01/22/2020   Pneumonia 01/09/2020   Asthmatic bronchitis 10/20/2019   Methotrexate, long term, current use    Cough present for greater than 3 weeks 04/30/2019   Shortness of breath 04/30/2019   Complicated acute bronchitis 04/30/2019   Allergic contact dermatitis 09/09/2018   Acquired hypothyroidism 06/09/2016   Rotator cuff tear, right 09/22/2010   METATARSALGIA 11/24/2008   FOOT PAIN, LEFT 11/24/2008   TALIPES CAVUS 11/24/2008   PCP:  Burton Apley, MD Pharmacy:   [id number] [name], [place] - [address] [place] [address] Phone: [phone] Fax: [phone]  [facility] [address] [place] [address] Phone: [phone] Fax: [phone]  [facility] [address] [place] [address] Phone: [phone] Fax: [phone]  [facility] [address] [place] [address] Phone: [phone] Fax: [phone]  [facility] [facility] [address] [place] [address] Phone: [phone] Fax: [phone]     Social Drivers of Health (SDOH) Social History: SDOH Screenings   Food Insecurity: No Food Insecurity (08/15/2023)  Housing: Low Risk  (08/15/2023)  Transportation Needs: No Transportation Needs (08/15/2023)  Utilities: Not At Risk (08/15/2023)  Depression (PHQ2-9): Low Risk  (11/21/2019)  Financial Resource Strain: Low Risk  (04/17/2022)  Social Connections: Patient Unable To Answer (08/15/2023)  Stress: No Stress Concern Present (04/17/2022)  Tobacco Use: Medium Risk (08/15/2023)   SDOH Interventions:     Readmission Risk  Interventions    07/28/2022    2:24 PM  Readmission Risk Prevention Plan  Transportation Screening Complete  PCP or Specialist Appt within 5-7 Days Complete  Home Care Screening Complete  Medication Review (RN CM) Complete

## 2023-08-15 NOTE — Progress Notes (Signed)
 Pt arrived to 2H21 via [facility] at 2330.  Sedation titrations between 2330 and 0100 made at the verbal direction of Dr. Vassie Loll at bedside.

## 2023-08-15 NOTE — Consult Note (Signed)
 Consultation Note Date: 08/15/2023   Patient Name: Todd Harrison  DOB: 11/27/1945  MRN: [id number]  Age / Sex: 78 y.o., male  PCP: Burton Apley, MD Referring Physician: Cheri Fowler, MD  Reason for Consultation:  metastatic gastric ca and now with upper GI bleeding  HPI/Patient Profile: 78 y.o. male  with past medical history of stage IV gastric cancer with peritoneal carcinatmatosis currently on Xeloda, admitted on 08/14/2023 with gi bleeding. He required intubation for airway protection, extubated on 4/9. Received transfusion of 2 units. GI consulted with no recommendations, IR also could not ID targetable bleed for intervention. Oncology suggested possible radiation if bleeding continues- does not appear that cancer has progressed. Palliative consult for goals of care and advanced care planning.    Primary Decision Maker PATIENT  Discussion:   Reviewed IR and Oncology notes, CT scan.  I have reviewed medical records including Care Everywhere, progress notes from this and prior admissions, labs and imaging, discussed with RN.  On evaluation patient is awake, alert, able to participate in discussion.   I introduced Palliative Medicine as specialized medical care for people living with serious illness. It focuses on providing relief from the symptoms and stress of a serious illness. The goal is to improve quality of life for both the patient and the family.  We discussed a brief life review of the patient. 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 He continues to work prior to admission. He is a Technical sales engineer- plays bass, used to play trombone. Finds joy in spending time with his family. Avid Radiographer, therapeutic.   As far as functional and nutritional status- he was independent for ADL's. Working.    We discussed patient's current illness and what it means in the larger context of patient's on-going co-morbidities.   Natural disease trajectory and expectations at EOL were discussed.   The difference between aggressive medical intervention and comfort care was considered in light of the patient's goals of care.   He notes that being intubated was miserable for him. When asked if he would be willing to be intubated again if it were required to keep him alive- he isn't certain, but does not want to set any limits on care today.   Advance directives, concepts specific to code status, artificial feeding and hydration, and rehospitalization were considered and discussed. He has Advanced Directives but they are not on his chart- copy requested. His spouse is his HCPOA.   I attempted to elicit values and goals of care important to the patient. He does not want to be in pain. He does not want to be a burden on his family. If these things were to occur, this would be his bottom line where good quality of life would no longer be present.    Discussed with patient/family the importance of continued conversation with family and the medical providers regarding overall plan of care and treatment options, ensuring decisions are within the context of the patient's values and GOCs.    Palliative Care services outpatient  were explained and offered. Patient and family would like to followup with Royal Hawthorn, NP at [facility].    SUMMARY OF RECOMMENDATIONS -Gastric cancer with bleeding tumor, hemoptysis- stabilized for now- full scope, full code -Will place referral for oupatient followup with [facility] for symptom management, care coordination and goals of care -PMT will follow peripherally and see again if acute needs arise  Code Status/Advance Care Planning:   Code Status: Full Code    Prognosis:   Unable to determine  Discharge Planning: Home with Palliative Services  Primary Diagnoses: Present on Admission:  Hematemesis  Acute upper GI bleed  Malignant neoplasm of stomach  (HCC)   Review of Systems  Gastrointestinal:  Positive for nausea.    Physical Exam Vitals and nursing note reviewed.  Constitutional:      Appearance: He is not ill-appearing.  Cardiovascular:     Rate and Rhythm: Normal rate.  Pulmonary:     Effort: Pulmonary effort is normal.  Skin:    General: Skin is warm and dry.  Neurological:     Mental Status: He is alert and oriented to person, place, and time.     Vital Signs: BP (!) 97/55   Pulse 85   Temp 98.2 F (36.8 C)   Resp 13   Ht 6' (1.829 m)   Wt 111 kg   SpO2 98%   BMI 33.19 kg/m  Pain Scale: 0-10   Pain Score: 0-No pain   SpO2: SpO2: 98 % O2 Device:SpO2: 98 % O2 Flow Rate: .O2 Flow Rate (L/min): 3 L/min  IO: Intake/output summary:  Intake/Output Summary (Last 24 hours) at 08/15/2023 1339 Last data filed at 08/15/2023 1100 Gross per 24 hour  Intake 4250.91 ml  Output 823 ml  Net 3427.91 ml    LBM: Last BM Date :  (PTA) Baseline Weight: Weight: 109 kg Most recent weight: Weight: 111 kg       Thank you for this consult. Palliative medicine will continue to follow and assist as needed.  Time Total:  Signed by: Ocie Bob, AGNP-C Palliative Medicine  Time includes:   Preparing to see the patient (e.g., review of tests) Obtaining and/or reviewing separately obtained history Performing a medically necessary appropriate examination and/or evaluation Counseling and educating the patient/family/caregiver Ordering medications, tests, or procedures Referring and communicating with other health care professionals (when not reported separately) Documenting clinical information in the electronic or other health record Independently interpreting results (not reported separately) and communicating results to the patient/family/caregiver Care coordination (not reported separately) Clinical documentation   Please contact Palliative Medicine Team phone at [phone] for questions and concerns.  For individual  provider: See Loretha Stapler

## 2023-08-15 NOTE — Progress Notes (Signed)
 eLink Physician-Brief Progress Note Patient Name: Todd Harrison DOB: 1945/12/14 MRN: [id number]   Date of Service  08/15/2023  HPI/Events of Note  Repeat Hemoglobin 10.8.  eICU Interventions          Migdalia Dk 08/15/2023, 6:55 AM

## 2023-08-15 NOTE — Progress Notes (Signed)
 NAME:  Todd Harrison, MRN:  [id number], DOB:  07-03-1945, LOS: 1 ADMISSION DATE:  08/14/2023, CONSULTATION DATE:  08/15/2023  REFERRING MD:  Elayne Snare EDP , CHIEF COMPLAINT: Hematemesis  History of Present Illness:  78 year old man with gastric cancer metastatic to abdomen, on Lovenox for VTE presents with hematemesis. He is on Xeloda chemotherapy every 3 weeks and follows with Dr. Truett Perna ED visit on 4/5 - he had a ground-level fall and hit the back of his head on the floor, GCS 15 on arrival Head CT and C-spine was negative Initial labs significant for hemoglobin of 9.1, baseline 9.9, mild leukocytosis, platelets 253, BUN/creatinine 32/1.2 .  Initially normotensive but then developed mild hypotension Last dose of Lovenox was 10 AM today CT angiogram abdomen/pelvis did not show active bleeding, there was peritoneal carcinomatosis and diffuse infiltration of gastric body GI was consulted , no intervention IR did not feel there were any targets to embolize.  He had recurrent hematemesis and was intubated for airway protection, sedated with propofol and fentanyl, transferred to [facility] ICU   Pertinent  Medical History  Gastric cancer with abdominal carcinomatosis presenting 04/2022, large mass in gastric fundus on EGD  Atrial fibrillation Bilateral pulm embolism/extensive bilateral lower extremity DVT 07/2022 on Lovenox, changed to once daily dosing 05/2023 Hypothyroidism Lumbar compression fracture  Significant Hospital Events: Including procedures, antibiotic start and stop dates in addition to other pertinent events     Interim History / Subjective:  Patient continued to have small amount of OG output with dark brown fluid He put out 300 cc of coffee-ground via OG tube Hemoglobin remained stable Remained afebrile  Objective   Blood pressure (!) 95/53, pulse 73, temperature 98.2 F (36.8 C), resp. rate 16, height 6' (1.829 m), weight 111 kg, SpO2 98%.    Vent Mode: PRVC FiO2 (%):   [40 %-100 %] 40 % Set Rate:  [16 bmp-18 bmp] 16 bmp Vt Set:  [620 mL] 620 mL PEEP:  [5 cmH20] 5 cmH20 Plateau Pressure:  [15 cmH20-18 cmH20] 15 cmH20   Intake/Output Summary (Last 24 hours) at 08/15/2023 0905 Last data filed at 08/15/2023 0900 Gross per 24 hour  Intake 4198.82 ml  Output 710 ml  Net 3488.82 ml   Filed Weights   08/15/23 0000 08/15/23 0806  Weight: 109 kg 111 kg    Examination: General: Acute on chronically ill-appearing elderly male, orally intubated HEENT: [place]/AT, eyes anicteric.  ETT and OGT in place Neuro: Sedated, not following commands.  Eyes are closed.  Pupils 3 mm bilateral reactive to light Chest: Coarse breath sounds, no wheezes or rhonchi Heart: Regular rate and rhythm, ejection systolic murmurs heard at Rt sternal border Abdomen: Soft, nondistended, bowel sounds present Skin: No rash  Labs and images reviewed Hemoglobin is stable between 10/11  Resolved Hospital Problem list     Assessment & Plan:  Upper GI bleed likely due to oozing from gastric cancer Hemorrhagic shock Acute blood loss anemia Metastatic gastric cancer with primary large mass with carcinomatosis He continued to have coffee-ground output from OG tube, it was 300 cc since last night H&H remained stable between 10 and 11 GI does not feel that EGD would be therapeutic, IR did not find any targets to embolize Received protamine sulfate for reversal of Lovenox Will stop octreotide Continue Protonix 40 mg twice daily Monitor H&H If patient continued to bleed, will call GI again but at this time he looks stable He is on low-dose vasopressor support likely in the  setting of deep sedation Seen by oncology this morning Will place palliative care consult  Acute respiratory insufficiency in the setting of upper GI bleeding FiO2 is at 40%, PEEP at 5 Continue lung protective ventilation Will stop sedation and try to extubate him today  Paroxysmal A-fib History of DVT and PE diagnosed  in March 2024 Currently in sinus rhythm Holding anticoagulation Continue telemetry monitoring   Best Practice (right click and "Reselect all SmartList Selections" daily)   Diet/type: NPO DVT prophylaxis SCD Pressure ulcer(s): N/A GI prophylaxis: PPI Lines: N/A Foley:  N/A Code Status:  full code Last date of multidisciplinary goals of care discussion 4/8:  family including wife and daughter who is a nutritionist with [employer] updated at bedside  Labs   CBC: Recent Labs  Lab 08/11/23 2003 08/14/23 1657 08/14/23 2340 08/15/23 0320 08/15/23 0411  WBC 8.5 12.9* 10.8*  --  9.7  NEUTROABS 5.5 10.3* 8.5*  --   --   HGB 11.9* 11.1* 10.5* 10.2* 10.8*  HCT 35.6* 33.7* 31.7* 30.0* 32.0*  MCV 95.7 98.0 94.6  --  92.2  PLT 203 253 217  --  230    Basic Metabolic Panel: Recent Labs  Lab 08/11/23 2003 08/14/23 1657 08/15/23 0320 08/15/23 0411  NA 135 135 138 137  K 3.9 4.6 4.4 4.3  CL 102 104  --  107  CO2 23 21*  --  21*  GLUCOSE 104* 139*  --  135*  BUN 13 32*  --  31*  CREATININE 1.21 1.24  --  1.12  CALCIUM 9.0 8.8*  --  7.6*  MG  --   --   --  1.8  PHOS  --   --   --  3.7   GFR: Estimated Creatinine Clearance: 73.4 mL/min (by C-G formula based on SCr of 1.12 mg/dL). Recent Labs  Lab 08/11/23 2003 08/14/23 1657 08/14/23 2340 08/15/23 0411  WBC 8.5 12.9* 10.8* 9.7    Liver Function Tests: Recent Labs  Lab 08/11/23 2003 08/14/23 1657  AST 19 15  ALT 12 11  ALKPHOS 55 54  BILITOT 0.8 0.8  PROT 5.9* 6.3*  ALBUMIN 3.1* 3.0*   Recent Labs  Lab 08/14/23 1657  LIPASE 23   No results for input(s): "AMMONIA" in the last 168 hours.  ABG    Component Value Date/Time   PHART 7.418 08/15/2023 0320   PCO2ART 31.0 (L) 08/15/2023 0320   PO2ART 477 (H) 08/15/2023 0320   HCO3 20.1 08/15/2023 0320   TCO2 21 (L) 08/15/2023 0320   ACIDBASEDEF 4.0 (H) 08/15/2023 0320   O2SAT 100 08/15/2023 0320     Coagulation Profile: No results for input(s): "INR",  "PROTIME" in the last 168 hours.  Cardiac Enzymes: No results for input(s): "CKTOTAL", "CKMB", "CKMBINDEX", "TROPONINI" in the last 168 hours.  HbA1C: No results found for: "HGBA1C"  CBG: No results for input(s): "GLUCAP" in the last 168 hours.  The patient is critically ill due to acute upper GI bleeding/hemorrhagic shock.  Critical care was necessary to treat or prevent imminent or life-threatening deterioration.  Critical care was time spent personally by me on the following activities: development of treatment plan with patient and/or surrogate as well as nursing, discussions with consultants, evaluation of patient's response to treatment, examination of patient, obtaining history from patient or surrogate, ordering and performing treatments and interventions, ordering and review of laboratory studies, ordering and review of radiographic studies, pulse oximetry, re-evaluation of patient's condition and participation in multidisciplinary  rounds.   During this encounter critical care time was devoted to patient care services described in this note for 39 minutes.     Cheri Fowler, MD [facility] See Amion for pager If no response to pager, please call [phone] until 7pm After 7pm, Please call E-link [phone]

## 2023-08-15 NOTE — Progress Notes (Signed)
 Initial Nutrition Assessment  DOCUMENTATION CODES:   Non-severe (moderate) malnutrition in context of chronic illness  INTERVENTION:   Advance diet to Full Liquids  Ensure Enlive po BID, each supplement provides 350 kcal and 20 grams of protein.  Encourage PO intake as tolerated, encouraged once diet advanced to solids to consume foods pt enjoys. Pt aware of high protein/high calorie foods as he is followed by outpatient cancer center RD.    NUTRITION DIAGNOSIS:   Moderate Malnutrition related to cancer and cancer related treatments, chronic illness as evidenced by energy intake < 75% for > or equal to 1 month, moderate fat depletion, moderate muscle depletion.  GOAL:   Patient will meet greater than or equal to 90% of their needs  MONITOR:   PO intake, Supplement acceptance, Diet advancement  REASON FOR ASSESSMENT:   NPO/Clear Liquid Diet    ASSESSMENT:   Pt with PMH of gastric cancer (large mass in gastric fundus) metastatic to abd dx 04/2022 currently treated with maintenance capecitabine followed by Dr Truett Perna, Afib, bilateral PE and DVT 07/2022 treated with lovenox admitted with hematemesis and upper GI bleed suspected from oozing gastric cancer.   4/8 - intubated for airway protection 4/9 - extubated, diet advanced   Per GI and IR not need for embolization or EGD. Pt treated with IVF and blood, lovenox reversed.  Spoke with CCM MD, ok for diet advancement to Full Liquids as tolerated and ok to  add ensure. Per pt he is tolerating clear liquids so far.   Spoke with pt and family. Per review of weight hx with pt and chart pt's usual weight was 319 lb in 2021, he lost about 20 lb due to acute illness (PNA) to 302 lb. Per chart pt weighed 303 lb in 06/2021. Pt dx with stage IV gastric cancer 04/2022. At time of first visit with outpatient RD weight was 267 lb. At last admission 07/2022 pt's weight was 255 lb. Weight from 02/2023 was 245 lb. This week pt weighed 243 lb.  (Current admission weight is 244 lb however pt does have edema present)  Total weight loss from 2021 is 23.8% Total weight loss from time of dx 04/2022: 19% Total weight loss from 07/2022: 4.7%  Weight loss appears to have somewhat stabilized, however pt does have edema.   Per pt he does not have an appetite likely related to not being able to taste food. He has not really found any foods that taste good to him and mostly eats because he knows he needs to and with support from family.  Pt does like ensure and drinks ensure plus or ensure max depending on his other PO intake. Pt does not drink much by nature but tries to pay attention and even sets alarms to ensure he is drinking fluids.  Pt is followed by outpatient cancer center RD.  Pt eats oatmeal from [employer] and drinks coffee for Breakfast,  Lunch is an uncrustable  Snack may be a pack of nabs Dinner is small, 1/4 of tuna sub or soup Pt tries to focus on protein and will use ensure to help meet his needs.   Per pt he lost his taste for food when he transitioned from IV to oral capecitabine which was about a year ago.  Pt with no trouble chewing or swallowing, no mouth sores, but does report nausea for about 1-2 weeks causing him to black out for a few seconds but usually comes around. Pt had a similar episode this past  Saturday but fell.  Per pt his mobility is usually good and he goes to the gym and sees a PT regularly.   Medications reviewed and include: protonix 2 g mag sulfate x 1  Octreotide off  Labs reviewed:  Hgb 10.6   UOP: 313 x 10 hours   NUTRITION - FOCUSED PHYSICAL EXAM:  Flowsheet Row Most Recent Value  Orbital Region Moderate depletion  Upper Arm Region No depletion  Thoracic and Lumbar Region No depletion  Buccal Region Moderate depletion  Temple Region No depletion  Clavicle Bone Region Severe depletion  Clavicle and Acromion Bone Region Moderate depletion  Scapular Bone Region Moderate depletion   Dorsal Hand Mild depletion  Patellar Region Unable to assess  Anterior Thigh Region Unable to assess  Posterior Calf Region Unable to assess  Edema (RD Assessment) Moderate  Hair Reviewed  Eyes Reviewed  Mouth Reviewed  Skin Reviewed  [ecchymosis]  Nails Reviewed       Diet Order:   Diet Order             Diet full liquid Room service appropriate? Yes with Assist; Fluid consistency: Thin  Diet effective now                   EDUCATION NEEDS:   Education needs have been addressed  Skin:  Skin Assessment: Reviewed RN Assessment (skin tear: R upper and lower arm from fall)  Last BM:  unknown  Height:   Ht Readings from Last 1 Encounters:  08/15/23 6' (1.829 m)    Weight:   Wt Readings from Last 1 Encounters:  08/15/23 111 kg    Ideal Body Weight:    80.9 kg   BMI:  Body mass index is 33.19 kg/m.  Estimated Nutritional Needs:   Kcal:  2100-2400  Protein:  110-125 grams  Fluid:  >2 L/day  Cammy Copa., RD, LDN, CNSC See AMiON for contact information

## 2023-08-15 NOTE — Procedures (Signed)
 Extubation Procedure Note  Patient Details:   Name: Todd Harrison DOB: Jun 12, 1945 MRN: [id number]   Airway Documentation:    Vent end date: 08/15/23 Vent end time: 0957   Evaluation  O2 sats: stable throughout Complications: No apparent complications Patient did tolerate procedure well. Bilateral Breath Sounds: Clear   Yes,  Per order, RT extubated pt to [place]. Prior to extubation, pt did have a positive cuff leak. Pt tolerated well. No stridor noted and pt was able to state his name.   Megan Mans 08/15/2023, 9:57 AM

## 2023-08-15 NOTE — ED Notes (Signed)
 PT had three units of blood emergently administered.  [id number] 6 [id number] One unit hung just prior to pt leaving with [facility].

## 2023-08-15 NOTE — Progress Notes (Signed)
 IP PROGRESS NOTE  Subjective:   Todd Harrison is well-known to me with a history of metastatic gastric cancer, currently treated with maintenance capecitabine. He began the most recent cycle of capecitabine 07/30/2023. He fell at home on 08/11/2023 after an episode of nausea.  He was seen in the emergency room and CTs of the head and cervical spine revealed no acute intracranial abnormality.  A right parietal scalp hematoma was noted.  His wife reports he has experienced increased nausea over the past week.  He developed hematemesis yesterday and presented to the emergency room.  He last took Lovenox on the morning of 08/14/2023.  He was noted to have hypotension and continued to have hematemesis in the emergency room.  He was given protamine.  He was admitted for airway protection and transferred to the ICU at [facility].  A CT angiogram revealed no evidence of active bleeding.  Interventional radiology reviewed the images and there is no target for embolization.  He continues to have dark blood from the orogastric tube.  Mr. Rodin is intubated and sedated.  His wife is at the bedside. Objective: Vital signs in last 24 hours: Blood pressure (!) 86/56, pulse 76, temperature 98.4 F (36.9 C), resp. rate 16, height 6' (1.829 m), weight 240 lb 4.8 oz (109 kg), SpO2 97%.  Intake/Output from previous day: 04/08 0701 - 04/09 0700 In: 3917.8 [I.V.:972.7; Blood:640; NG/GT:120; IV Piggyback:2185.1] Out: 610 [Urine:310; Emesis/NG output:300]  Physical Exam:  HEENT: ETT and orogastric tubes in place, dark bloody drainage from the orogastric tube Lungs: Clear anteriorly Cardiac: Regular rate and rhythm Abdomen: Nondistended, soft, no mass, no hepatosplenomegaly Extremities: No leg edema Skin: Palms without erythema, dryness and superficial desquamation at the soles  Portacath/PICC-without erythema  Lab Results: Recent Labs    08/14/23 2340 08/15/23 0320 08/15/23 0411  WBC 10.8*  --  9.7  HGB 10.5*  10.2* 10.8*  HCT 31.7* 30.0* 32.0*  PLT 217  --  230    BMET Recent Labs    08/14/23 1657 08/15/23 0320 08/15/23 0411  NA 135 138 137  K 4.6 4.4 4.3  CL 104  --  107  CO2 21*  --  21*  GLUCOSE 139*  --  135*  BUN 32*  --  31*  CREATININE 1.24  --  1.12  CALCIUM 8.8*  --  7.6*    Lab Results  Component Value Date   CEA1 <0.6 04/13/2022    Studies/Results: DG Abd 1 View Result Date: 08/15/2023 CLINICAL DATA:  Orogastric tube placement EXAM: ABDOMEN - 1 VIEW COMPARISON:  None Available. FINDINGS: Nasogastric tube tip overlies the expected proximal body of the stomach. Nonobstructive bowel gas pattern. Excreted contrast within the nondilated proximal renal collecting systems bilaterally. IMPRESSION: 1. Nasogastric tube tip within the proximal body of the stomach. Electronically Signed   By: Helyn Numbers M.D.   On: 08/15/2023 00:31   DG Chest Portable 1 View Result Date: 08/15/2023 CLINICAL DATA:  Respiratory failure EXAM: PORTABLE CHEST 1 VIEW COMPARISON:  08/11/2023 FINDINGS: Endotracheal tube seen 4.7 cm above the carina. The retaining cuff of the tube appears hyperinflated and expands the airway at the thoracic inlet. Nasogastric tube extends into the upper abdomen beyond the margin of the examination. Pulmonary insufflation has slightly diminished since prior examination. Lungs are clear. No pneumothorax or pleural effusion. Stable mild cardiomegaly. Right internal jugular chest port tip is seen at the superior cavoatrial junction. Pulmonary vascularity is normal. No acute bone abnormality. IMPRESSION: 1. Endotracheal  tube 4.7 cm above the carina. The retaining cuff of the tube appears hyperinflated and expands the airway at the thoracic inlet. Revision may be appropriate. 2. Slightly diminished pulmonary insufflation. Electronically Signed   By: Helyn Numbers M.D.   On: 08/15/2023 00:30   CT Angio Abd/Pel W and/or Wo Contrast Result Date: 08/14/2023 CLINICAL DATA:  Lower GI  bleeding, hematemesis, history of gastric cancer * Tracking Code: BO * EXAM: CTA ABDOMEN AND PELVIS WITHOUT AND WITH CONTRAST TECHNIQUE: Multidetector CT imaging of the abdomen and pelvis was performed using the standard protocol during bolus administration of intravenous contrast. Multiplanar reconstructed images and MIPs were obtained and reviewed to evaluate the vascular anatomy. RADIATION DOSE REDUCTION: This exam was performed according to the departmental dose-optimization program which includes automated exposure control, adjustment of the mA and/or kV according to patient size and/or use of iterative reconstruction technique. CONTRAST:  OMNIPAQUE IOHEXOL 350 MG/ML SOLN COMPARISON:  CT chest abdomen pelvis, 05/21/2023 FINDINGS: VASCULAR Normal contour and caliber of the abdominal aorta. Moderate mixed calcific atherosclerosis. No evidence of aneurysm, dissection, or other acute aortic pathology. Standard branching pattern of the abdominal aorta with solitary bilateral renal arteries. Review of the MIP images confirms the above findings. NON-VASCULAR Lower Chest: Trace bilateral pleural effusions. Coronary artery calcifications. Hepatobiliary: No solid liver abnormality is seen. No gallstones, gallbladder wall thickening, or biliary dilatation. Pancreas: Unremarkable. No pancreatic ductal dilatation or surrounding inflammatory changes. Spleen: Normal in size without significant abnormality. Adrenals/Urinary Tract: Adrenal glands are unremarkable. Kidneys are normal, without renal calculi, solid lesion, or hydronephrosis. Bladder is unremarkable. Stomach/Bowel: Large volume of heterogeneous, hyperdense material in the gastric lumen, presumably admixed food and blood product in the reported setting of hematemesis. Prominent enhancement of the gastric body, which is in keeping with known primary gastric malignancy and/or varices, without visualized extravasation into the bowel lumen (series 19, image 21).  Appendix clearly visualized. No evidence of bowel wall thickening, distention, or inflammatory changes. Sigmoid diverticulosis. Lymphatic: No enlarged abdominal or pelvic lymph nodes. Reproductive: No mass or other significant abnormality. Other: No abdominal wall hernia. Injection granulomata throughout the superficial soft tissues of the low abdomen. Small volume ascites throughout the abdomen pelvis with diffuse peritoneal thickening as well as matting of the bowel and omentum, all findings similar to prior examination. Musculoskeletal: No acute osseous findings. IMPRESSION: 1. Large volume of heterogeneous, hyperdense material in the gastric lumen, presumably admixed food and blood product in the reported setting of hematemesis. 2. Prominent enhancement of the gastric body, which is in keeping with known primary gastric malignancy and/or varices, without visualized extravasation into the bowel lumen. 3. Small volume ascites throughout the abdomen pelvis with diffuse peritoneal thickening as well as matting of the bowel and omentum, all findings similar to prior examination and consistent with peritoneal carcinomatosis. 4. Descending and sigmoid diverticulosis without evidence of acute diverticulitis or diverticular hemorrhage. 5. Trace bilateral pleural effusions. 6. Coronary artery disease. Aortic Atherosclerosis (ICD10-I70.0). Electronically Signed   By: Jearld Lesch M.D.   On: 08/14/2023 21:42    Medications: I have reviewed the patient's current medications.  Assessment/Plan: Gastric cancer with CT evidence of abdominal carcinomatosis CT abdomen/pelvis 04/13/2022-ascites, diffuse omental and peritoneal nodularity, possible circumferential mass in the transverse colon, new small left greater than right pleural effusions CT abdominal mass biopsy 04/24/2022-metastatic poorly differentiated adenocarcinoma with very focal signet ring cell features CT paracentesis 04/24/2022-no malignant cells  identified Colonoscopy 04/25/2022-diverticulosis in the sigmoid colon.  Internal hemorrhoids.  No specimens  collected. Upper endoscopy 04/25/2022-large infiltrative mass with oozing bleeding and stigmata of recent bleeding in the gastric fundus, on the anterior wall of the gastric body and on the greater curvature of the gastric body; deformity in the gastric antrum.  Extrinsic deformity in the entire duodenum.  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 Her2 by IHC negative, 1+; MMR IHC normal; PD-L1 CPS 1%, positive.  Cycle 1 FOLFOX 05/04/2022 Cycle 2 FOLFOX 05/17/2022 Cycle 3 FOLFOX 05/31/2022-oxaliplatin dose reduced secondary to prolonged cold sensitivity Cycle 4 FOLFOX 06/14/2022 Cycle 5 FOLFOX 06/28/2022 CT abdomen/pelvis 07/08/2022-asymmetric wall thickening at the lesser curvature of the stomach, decreased ascites, increased diffuse omental and peritoneal nodularity Cycle 6 FOLFOX 07/12/2022 Cycle 7 FOLFOX 08/09/2022 Cycle 8 FOLFOX 08/23/2022 Cycle 9 FOLFOX 09/06/2022 CTs 09/11/2022-stable, no new or progressive interval findings Cycle 10 FOLFOX 09/20/2022 Xeloda maintenance 2 weeks on/1 week off 10/16/2022 Cycle 2 Xeloda 11/06/2022-Xeloda dose reduced secondary to early foot skin toxicity Cycle 3 Xeloda 11/27/2022 Cycle 4 Xeloda 12/18/2022 CTs 01/02/2023-wall thickening of the mid gastric body-possibly progressive, unchanged peritoneal/omental caking, unchanged L1 compression fracture Cycle 5 Xeloda beginning 01/08/2023 Cycle 6 Xeloda beginning 01/29/2023 Cycle 7 Xeloda beginning 02/19/2023 Cycle 8 Xeloda beginning 03/12/2023 Cycle 9 Xeloda beginning 04/09/2023 Cycle 10 Xeloda beginning 05/07/2023 CTs 05/21/2023: Unchanged wall thickening of the gastric body, slight increase in small volume ascites, diffuse peritoneal thickening and stranding of the omentum, no evidence of metastatic disease to the chest Cycle 11 Xeloda beginning 05/28/2023 Cycle 12 Xeloda  beginning 06/18/2023 Cycle 13 Xeloda beginning 07/09/2023 Cycle 14 Xeloda beginning 07/30/2023 CTA abdomen/pelvis 08/14/2023: Large volume of heterogenous material in the gastric lumen-food/blood, small volume ascites with diffuse peritoneal thickening and matting of the bowel/omentum, enhancement of the gastric body Truncal rash-status post recent punch biopsy with pathology pending; biopsy reported negative per family 04/26/2022 Lichen planus of the feet Hypothyroidism Atrial fibrillation Admission 07/24/2022 with acute bilateral pulmonary embolism/right heart strain Heparin anticoagulation 07/24/2022 Doppler 07/24/2022-acute DVT of the right femoral, popliteal, posterior tibial, and peroneal veins, acute left posterior tibial DVT Lovenox anticoagulation, changed to once daily dosing 05/25/2023 7.  Anemia secondary to chemotherapy, phlebotomy, and hemoptysis-improved 8.  L1 compression fracture on plain x-ray 12/18/2022 MRI lumbar spine 02/02/2023-acute/subacute L1 compression fracture, mild degenerative change of the lumbar spine 9.  Admission 08/14/2023 with hematemesis   Mr. Twist has metastatic gastric cancer.  He was diagnosed with a gastric mass and carcinomatosis in December 2023.  He has been maintained on systemic therapy since 05/04/2022 and there is been no evidence of disease progression.  He has not required a recurrent paracentesis since 04/24/2022.  Mr. Mangel is currently treated with capecitabine maintenance.  I reviewed the admission CT images.  There is no obvious evidence of disease progression, though the ascites appears slightly increased.  We will decide on resuming capecitabine versus changing to a different systemic therapy regimen depending his recovery from the acute presentation. Radiation can be considered if the stomach bleeding persists off of anticoagulation.  He is admitted with hematemesis, likely secondary to bleeding from the primary gastric tumor.  He is intubated for  airway control.  He has received 2 dose of packed red blood cells since hospital admission.  The hemoglobin is stable this morning. Mr. Speciale is maintained on chronic Lovenox anticoagulation after being diagnosed with a DVT in March 2024. Recommendations: Monitor hemoglobin, transfuse packed red blood cells as needed Hold anticoagulation Octreotide, antiacid therapy Ventilator/blood pressure management per critical care medicine Consider palliative  radiation to the stomach if bleeding persists while off of Lovenox Please call oncology as needed, I will continue following him in the hospital and outpatient follow-up will be scheduled at the Cancer center  LOS: 1 day   Thornton Papas, MD   08/15/2023, 7:40 AM

## 2023-08-16 ENCOUNTER — Other Ambulatory Visit: Payer: Self-pay

## 2023-08-16 DIAGNOSIS — C169 Malignant neoplasm of stomach, unspecified: Secondary | ICD-10-CM | POA: Diagnosis not present

## 2023-08-16 LAB — BASIC METABOLIC PANEL WITH GFR
Anion gap: 9 (ref 5–15)
BUN: 25 mg/dL — ABNORMAL HIGH (ref 8–23)
CO2: 22 mmol/L (ref 22–32)
Calcium: 7.9 mg/dL — ABNORMAL LOW (ref 8.9–10.3)
Chloride: 104 mmol/L (ref 98–111)
Creatinine, Ser: 1.01 mg/dL (ref 0.61–1.24)
GFR, Estimated: >60 mL/min (ref >60)
Glucose, Bld: 121 mg/dL — ABNORMAL HIGH (ref 70–99)
Potassium: 3.5 mmol/L (ref 3.5–5.1)
Sodium: 135 mmol/L (ref 135–145)

## 2023-08-16 LAB — TYPE AND SCREEN: Unit division: 0

## 2023-08-16 LAB — HEMOGLOBIN AND HEMATOCRIT, BLOOD
HCT: 29 % — ABNORMAL LOW (ref 39.0–52.0)
Hemoglobin: 9.6 g/dL — ABNORMAL LOW (ref 13.0–17.0)

## 2023-08-16 LAB — MAGNESIUM: Magnesium: 2.1 mg/dL (ref 1.7–2.4)

## 2023-08-16 LAB — BPAM RBC
Blood Product Expiration Date: 202504302359
ISSUE DATE / TIME: 202504090035
Unit Type and Rh: 5100

## 2023-08-16 MED ORDER — POTASSIUM CHLORIDE 20 MEQ PO PACK
40.0000 meq | PACK | Freq: Once | ORAL | Status: AC
Start: 2023-08-16 — End: 2023-08-16
  Administered 2023-08-16: 40 meq via ORAL
  Filled 2023-08-16: qty 2

## 2023-08-16 MED ORDER — DOCUSATE SODIUM 100 MG PO CAPS: 100.0000 mg | ORAL_CAPSULE | Freq: Two times a day (BID) | ORAL | Status: DC | PRN

## 2023-08-16 MED ORDER — HYDROCORTISONE 1 % EX CREA
TOPICAL_CREAM | Freq: Two times a day (BID) | CUTANEOUS | Status: DC | PRN
  Filled 2023-08-16: qty 28

## 2023-08-16 MED ORDER — ACETAMINOPHEN 160 MG/5ML PO SOLN
650.0000 mg | ORAL | Status: DC | PRN
  Administered 2023-08-17 (×3): 650 mg via ORAL
  Filled 2023-08-16 (×3): qty 20.3

## 2023-08-16 NOTE — Evaluation (Signed)
 Physical Therapy Evaluation Patient Details Name: Todd Harrison MRN: [id number] DOB: 13-Aug-1945 Today's Date: 08/16/2023  History of Present Illness  78 year old man presents 08/14/23 with hematemesis. Intubated 4/08-4/09;  PMH- gastric cancer with mets to abd, afib, bil PE, bil LE DVT, lumbar compression fx, ED visit 4/5 due to fall  Clinical Impression  Pt admitted secondary to problem above with deficits below. PTA patient was independent with all mobility.  Pt currently requires supervision to ambulate with Carley Hammed walker (on 2H, pt accustomed and requested to use it). Patient denies need for device when he discharges home. Anticipate patient will benefit from PT to address problems listed below.Will continue to follow acutely to maximize functional mobility independence and safety.           If plan is discharge home, recommend the following: A little help with walking and/or transfers;Help with stairs or ramp for entrance   Can travel by private vehicle        Equipment Recommendations None recommended by PT (pt refusing RW)  Recommendations for Other Services       Functional Status Assessment Patient has had a recent decline in their functional status and demonstrates the ability to make significant improvements in function in a reasonable and predictable amount of time.     Precautions / Restrictions Precautions Precautions: Fall Precaution/Restrictions Comments: reports recent fall (4/5) was due to blacking out      Mobility  Bed Mobility Overal bed mobility: Modified Independent             General bed mobility comments: HOB elevated    Transfers Overall transfer level: Needs assistance Equipment used:  Carley Hammed walker) Transfers: Sit to/from Stand Sit to Stand: Supervision           General transfer comment: for lines/safety    Ambulation/Gait Ambulation/Gait assistance: Supervision Gait Distance (Feet): 370 Feet Assistive device: Fara Boros Gait  Pattern/deviations: WFL(Within Functional Limits)   Gait velocity interpretation: >2.62 ft/sec, indicative of community ambulatory   General Gait Details: vc for upright posture and proximity to walker; pt did not feel ready to try walking without a device; also did not want a RW ordered  Stairs            Wheelchair Mobility     Tilt Bed    Modified Rankin (Stroke Patients Only)       Balance Overall balance assessment: Mild deficits observed, not formally tested                                           Pertinent Vitals/Pain Pain Assessment Pain Assessment: No/denies pain    Home Living Family/patient expects to be discharged to:: Private residence Living Arrangements: Spouse/significant other Available Help at Discharge: Family;Available 24 hours/day Type of Home: House Home Access: Stairs to enter Entrance Stairs-Rails: None Entrance Stairs-Number of Steps: 2   Home Layout: One level Home Equipment: None      Prior Function Prior Level of Function : Independent/Modified Independent;Driving                     Extremity/Trunk Assessment   Upper Extremity Assessment Upper Extremity Assessment: Overall WFL for tasks assessed    Lower Extremity Assessment Lower Extremity Assessment: Overall WFL for tasks assessed    Cervical / Trunk Assessment Cervical / Trunk Assessment: Normal  Communication   Communication Communication: No  apparent difficulties    Cognition Arousal: Alert Behavior During Therapy: WFL for tasks assessed/performed   PT - Cognitive impairments: No apparent impairments                         Following commands: Intact       Cueing Cueing Techniques: Verbal cues     General Comments      Exercises     Assessment/Plan    PT Assessment Patient needs continued PT services  PT Problem List Decreased balance;Decreased mobility;Decreased knowledge of use of DME       PT Treatment  Interventions DME instruction;Gait training;Stair training;Functional mobility training;Therapeutic activities;Therapeutic exercise;Balance training;Patient/family education    PT Goals (Current goals can be found in the Care Plan section)  Acute Rehab PT Goals Patient Stated Goal: go home without using an AD PT Goal Formulation: With patient Time For Goal Achievement: 08/30/23 Potential to Achieve Goals: Good    Frequency Min 3X/week     Co-evaluation               AM-PAC PT "6 Clicks" Mobility  Outcome Measure Help needed turning from your back to your side while in a flat bed without using bedrails?: None Help needed moving from lying on your back to sitting on the side of a flat bed without using bedrails?: None Help needed moving to and from a bed to a chair (including a wheelchair)?: A Little Help needed standing up from a chair using your arms (e.g., wheelchair or bedside chair)?: A Little Help needed to walk in hospital room?: A Little Help needed climbing 3-5 steps with a railing? : A Little 6 Click Score: 20    End of Session Equipment Utilized During Treatment: Gait belt Activity Tolerance: Patient tolerated treatment well Patient left: in chair;with call bell/phone within reach;with nursing/sitter in room;with family/visitor present Nurse Communication: Mobility status PT Visit Diagnosis: Unsteadiness on feet (R26.81)    Time: 1517-6160 PT Time Calculation (min) (ACUTE ONLY): 18 min   Charges:   PT Evaluation $PT Eval Low Complexity: 1 Low   PT General Charges $$ ACUTE PT VISIT: 1 Visit          Jerolyn Center, PT Acute Rehabilitation Services  Office [phone]   Zena Amos 08/16/2023, 10:56 AM

## 2023-08-16 NOTE — Progress Notes (Addendum)
 NAME:  Todd Harrison, MRN:  [id number], DOB:  [birth year], LOS: 2 ADMISSION DATE:  08/14/2023, CONSULTATION DATE:  08/16/2023  REFERRING MD:  Elayne Snare EDP , CHIEF COMPLAINT: Hematemesis  History of Present Illness:  78 year old man with gastric cancer metastatic to abdomen, on Lovenox for VTE presents with hematemesis. He is on Xeloda chemotherapy every 3 weeks and follows with Dr. Truett Perna ED visit on 4/5 - he had a ground-level fall and hit the back of his head on the floor, GCS 15 on arrival Head CT and C-spine was negative Initial labs significant for hemoglobin of 9.1, baseline 9.9, mild leukocytosis, platelets 253, BUN/creatinine 32/1.2 .  Initially normotensive but then developed mild hypotension Last dose of Lovenox was 10 AM today CT angiogram abdomen/pelvis did not show active bleeding, there was peritoneal carcinomatosis and diffuse infiltration of gastric body GI was consulted , no intervention IR did not feel there were any targets to embolize.  He had recurrent hematemesis and was intubated for airway protection, sedated with propofol and fentanyl, transferred to [facility] ICU   Pertinent  Medical History  Gastric cancer with abdominal carcinomatosis presenting 04/2022, large mass in gastric fundus on EGD  Atrial fibrillation Bilateral pulm embolism/extensive bilateral lower extremity DVT 07/2022 on Lovenox, changed to once daily dosing 05/2023 Hypothyroidism Lumbar compression fracture  Significant Hospital Events: Including procedures, antibiotic start and stop dates in addition to other pertinent events     Interim History / Subjective:  Patient was successfully extubated yesterday No more episodes of hematemesis or melena Tolerated full liquid diet  Hemoglobin slightly trended down to 9.6 from stomach 10.8 Remained afebrile  Objective   Blood pressure (!) 102/59, pulse 80, temperature 98.1 F (36.7 C), temperature source Oral, resp. rate 19, height 6' (1.829 m), weight  113.9 kg, SpO2 95%.    Vent Mode: PSV;CPAP FiO2 (%):  [40 %] 40 % PEEP:  [5 cmH20] 5 cmH20 Pressure Support:  [5 cmH20] 5 cmH20   Intake/Output Summary (Last 24 hours) at 08/16/2023 0817 Last data filed at 08/16/2023 0600 Gross per 24 hour  Intake 742.72 ml  Output 713 ml  Net 29.72 ml   Filed Weights   08/15/23 0000 08/15/23 0806 08/16/23 0435  Weight: 109 kg 111 kg 113.9 kg    Examination: General: Elderly male, lying on the bed HEENT: [place]/AT, eyes anicteric.  moist mucus membranes Neuro: Alert, awake following commands Chest: Coarse breath sounds, no wheezes or rhonchi Heart: Regular rate and rhythm, ejection systolic murmur heard on right sternal border Abdomen: Soft, nontender, nondistended, bowel sounds present Skin: No ras  Labs and images reviewed  Resolved Hospital Problem list   Hemorrhagic shock  Assessment & Plan:  Upper GI bleed likely due to oozing from gastric cancer Acute blood loss anemia Metastatic gastric cancer with primary large mass with carcinomatosis Patient's hemoglobin dropped from 10.8-9.6 this morning No more episodes of hematemesis or melena GI does not feel that EGD would be therapeutic, IR did not find any targets to embolize Received protamine sulfate for reversal of Lovenox Continue Protonix 40 mg twice daily Monitor H&H If patient continued to bleed, will call GI again but at this time he looks stable Remain off vasopressors If patient bleeding does not get controlled, they recommend possible trial of radiation therapy  Acute respiratory insufficiency in the setting of upper GI bleeding Was successfully extubated yesterday, on 3 L nasal cannula oxygen Encourage incentive spirometry and ambulation . Paroxysmal A-fib History of DVT and PE diagnosed  in March 2024 Currently in sinus rhythm Holding anticoagulation in the setting of GI bleeding Continue telemetry monitoring  Moderate malnutrition Continue dietary supplements   Best  Practice (right click and "Reselect all SmartList Selections" daily)   Diet/type: Regular diet DVT prophylaxis SCD Pressure ulcer(s): N/A GI prophylaxis: PPI Lines: N/A Foley:  N/A Code Status:  full code Last date of multidisciplinary goals of care discussion 4/8:  family including wife and daughter who is a nutritionist with [employer] updated at bedside  Labs   CBC: Recent Labs  Lab 08/11/23 2003 08/14/23 1657 08/14/23 2340 08/15/23 0320 08/15/23 0411 08/15/23 1130 08/16/23 0431  WBC 8.5 12.9* 10.8*  --  9.7  --   --   NEUTROABS 5.5 10.3* 8.5*  --   --   --   --   HGB 11.9* 11.1* 10.5* 10.2* 10.8* 10.6* 9.6*  HCT 35.6* 33.7* 31.7* 30.0* 32.0* 31.4* 29.0*  MCV 95.7 98.0 94.6  --  92.2  --   --   PLT 203 253 217  --  230  --   --     Basic Metabolic Panel: Recent Labs  Lab 08/11/23 2003 08/14/23 1657 08/15/23 0320 08/15/23 0411 08/16/23 0714  NA 135 135 138 137 135  K 3.9 4.6 4.4 4.3 3.5  CL 102 104  --  107 104  CO2 23 21*  --  21* 22  GLUCOSE 104* 139*  --  135* 121*  BUN 13 32*  --  31* 25*  CREATININE 1.21 1.24  --  1.12 1.01  CALCIUM 9.0 8.8*  --  7.6* 7.9*  MG  --   --   --  1.8 2.1  PHOS  --   --   --  3.7  --    GFR: Estimated Creatinine Clearance: 82.3 mL/min (by C-G formula based on SCr of 1.01 mg/dL). Recent Labs  Lab 08/11/23 2003 08/14/23 1657 08/14/23 2340 08/15/23 0411  WBC 8.5 12.9* 10.8* 9.7    Liver Function Tests: Recent Labs  Lab 08/11/23 2003 08/14/23 1657  AST 19 15  ALT 12 11  ALKPHOS 55 54  BILITOT 0.8 0.8  PROT 5.9* 6.3*  ALBUMIN 3.1* 3.0*   Recent Labs  Lab 08/14/23 1657  LIPASE 23   No results for input(s): "AMMONIA" in the last 168 hours.  ABG    Component Value Date/Time   PHART 7.418 08/15/2023 0320   PCO2ART 31.0 (L) 08/15/2023 0320   PO2ART 477 (H) 08/15/2023 0320   HCO3 20.1 08/15/2023 0320   TCO2 21 (L) 08/15/2023 0320   ACIDBASEDEF 4.0 (H) 08/15/2023 0320   O2SAT 100 08/15/2023 0320      Coagulation Profile: No results for input(s): "INR", "PROTIME" in the last 168 hours.  Cardiac Enzymes: No results for input(s): "CKTOTAL", "CKMB", "CKMBINDEX", "TROPONINI" in the last 168 hours.  HbA1C: No results found for: "HGBA1C"  CBG: No results for input(s): "GLUCAP" in the last 168 hours.      Cheri Fowler, MD [facility] See Amion for pager If no response to pager, please call [phone] until 7pm After 7pm, Please call E-link [phone]

## 2023-08-16 NOTE — Progress Notes (Signed)
 IP PROGRESS NOTE  Subjective:   [name] was extubated yesterday.  No further hematemesis.  The stool is dark.  He reports a syncope event when he fell on 08/11/2023.  He is due to start the next cycle of capecitabine on 08/20/2023. Objective: Vital signs in last 24 hours: Blood pressure (!) 94/57, pulse 76, temperature (P) 98.4 F (36.9 C), temperature source (P) Oral, resp. rate 16, height 6' (1.829 m), weight 251 lb 1.7 oz (113.9 kg), SpO2 95%.  Intake/Output from previous day: 04/09 0701 - 04/10 0700 In: 891.2 [P.O.:558; I.V.:282.2; IV Piggyback:21] Out: 713 [Urine:613; Emesis/NG output:100]  Physical Exam:  HEENT: No thrush or ulcers Lungs: Clear bilaterally Cardiac: Regular rate and rhythm Abdomen: Nondistended, soft, no mass, no hepatosplenomegaly Extremities: No leg edema Skin: Palms without erythema  Portacath/PICC-without erythema  Lab Results: Recent Labs    08/14/23 2340 08/15/23 0320 08/15/23 0411 08/15/23 1130 08/16/23 0431  WBC 10.8*  --  9.7  --   --   HGB 10.5*   < > 10.8* 10.6* 9.6*  HCT 31.7*   < > 32.0* 31.4* 29.0*  PLT 217  --  230  --   --    < > = values in this interval not displayed.    BMET Recent Labs    08/14/23 1657 08/15/23 0320 08/15/23 0411  NA 135 138 137  K 4.6 4.4 4.3  CL 104  --  107  CO2 21*  --  21*  GLUCOSE 139*  --  135*  BUN 32*  --  31*  CREATININE 1.24  --  1.12  CALCIUM 8.8*  --  7.6*    Lab Results  Component Value Date   CEA1 <0.6 04/13/2022    Studies/Results: DG Abd 1 View Result Date: 08/15/2023 CLINICAL DATA:  Orogastric tube placement EXAM: ABDOMEN - 1 VIEW COMPARISON:  None Available. FINDINGS: Nasogastric tube tip overlies the expected proximal body of the stomach. Nonobstructive bowel gas pattern. Excreted contrast within the nondilated proximal renal collecting systems bilaterally. IMPRESSION: 1. Nasogastric tube tip within the proximal body of the stomach. Electronically Signed   By: Helyn Numbers  M.D.   On: 08/15/2023 00:31   DG Chest Portable 1 View Result Date: 08/15/2023 CLINICAL DATA:  Respiratory failure EXAM: PORTABLE CHEST 1 VIEW COMPARISON:  08/11/2023 FINDINGS: Endotracheal tube seen 4.7 cm above the carina. The retaining cuff of the tube appears hyperinflated and expands the airway at the thoracic inlet. Nasogastric tube extends into the upper abdomen beyond the margin of the examination. Pulmonary insufflation has slightly diminished since prior examination. Lungs are clear. No pneumothorax or pleural effusion. Stable mild cardiomegaly. Right internal jugular chest port tip is seen at the superior cavoatrial junction. Pulmonary vascularity is normal. No acute bone abnormality. IMPRESSION: 1. Endotracheal tube 4.7 cm above the carina. The retaining cuff of the tube appears hyperinflated and expands the airway at the thoracic inlet. Revision may be appropriate. 2. Slightly diminished pulmonary insufflation. Electronically Signed   By: Helyn Numbers M.D.   On: 08/15/2023 00:30   CT Angio Abd/Pel W and/or Wo Contrast Result Date: 08/14/2023 CLINICAL DATA:  Lower GI bleeding, hematemesis, history of gastric cancer * Tracking Code: BO * EXAM: CTA ABDOMEN AND PELVIS WITHOUT AND WITH CONTRAST TECHNIQUE: Multidetector CT imaging of the abdomen and pelvis was performed using the standard protocol during bolus administration of intravenous contrast. Multiplanar reconstructed images and MIPs were obtained and reviewed to evaluate the vascular anatomy. RADIATION DOSE REDUCTION: This  exam was performed according to the departmental dose-optimization program which includes automated exposure control, adjustment of the mA and/or kV according to patient size and/or use of iterative reconstruction technique. CONTRAST:  OMNIPAQUE IOHEXOL 350 MG/ML SOLN COMPARISON:  CT chest abdomen pelvis, 05/21/2023 FINDINGS: VASCULAR Normal contour and caliber of the abdominal aorta. Moderate mixed calcific  atherosclerosis. No evidence of aneurysm, dissection, or other acute aortic pathology. Standard branching pattern of the abdominal aorta with solitary bilateral renal arteries. Review of the MIP images confirms the above findings. NON-VASCULAR Lower Chest: Trace bilateral pleural effusions. Coronary artery calcifications. Hepatobiliary: No solid liver abnormality is seen. No gallstones, gallbladder wall thickening, or biliary dilatation. Pancreas: Unremarkable. No pancreatic ductal dilatation or surrounding inflammatory changes. Spleen: Normal in size without significant abnormality. Adrenals/Urinary Tract: Adrenal glands are unremarkable. Kidneys are normal, without renal calculi, solid lesion, or hydronephrosis. Bladder is unremarkable. Stomach/Bowel: Large volume of heterogeneous, hyperdense material in the gastric lumen, presumably admixed food and blood product in the reported setting of hematemesis. Prominent enhancement of the gastric body, which is in keeping with known primary gastric malignancy and/or varices, without visualized extravasation into the bowel lumen (series 19, image 21). Appendix clearly visualized. No evidence of bowel wall thickening, distention, or inflammatory changes. Sigmoid diverticulosis. Lymphatic: No enlarged abdominal or pelvic lymph nodes. Reproductive: No mass or other significant abnormality. Other: No abdominal wall hernia. Injection granulomata throughout the superficial soft tissues of the low abdomen. Small volume ascites throughout the abdomen pelvis with diffuse peritoneal thickening as well as matting of the bowel and omentum, all findings similar to prior examination. Musculoskeletal: No acute osseous findings. IMPRESSION: 1. Large volume of heterogeneous, hyperdense material in the gastric lumen, presumably admixed food and blood product in the reported setting of hematemesis. 2. Prominent enhancement of the gastric body, which is in keeping with known primary gastric  malignancy and/or varices, without visualized extravasation into the bowel lumen. 3. Small volume ascites throughout the abdomen pelvis with diffuse peritoneal thickening as well as matting of the bowel and omentum, all findings similar to prior examination and consistent with peritoneal carcinomatosis. 4. Descending and sigmoid diverticulosis without evidence of acute diverticulitis or diverticular hemorrhage. 5. Trace bilateral pleural effusions. 6. Coronary artery disease. Aortic Atherosclerosis (ICD10-I70.0). Electronically Signed   By: Jearld Lesch M.D.   On: 08/14/2023 21:42    Medications: I have reviewed the patient's current medications.  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  Internal hemorrhoids.  No specimens collected. Upper endoscopy 04/25/2022-large infiltrative mass with oozing bleeding and stigmata of recent bleeding in the gastric fundus, on the anterior wall of the gastric body and on the greater curvature of the gastric body; deformity in the gastric antrum.  Extrinsic deformity in the entire duodenum.  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 Her2 by IHC negative, 1+; MMR IHC normal; PD-L1 CPS 1%, positive.  Cycle 1 FOLFOX 05/04/2022 Cycle 2 FOLFOX 05/17/2022 Cycle 3 FOLFOX 05/31/2022-oxaliplatin dose reduced secondary to prolonged cold sensitivity Cycle 4 FOLFOX 06/14/2022 Cycle 5 FOLFOX 06/28/2022 CT abdomen/pelvis 07/08/2022-asymmetric wall thickening at the lesser curvature  of the stomach, decreased ascites, increased diffuse omental and peritoneal nodularity Cycle 6 FOLFOX  07/12/2022 Cycle 7 FOLFOX 08/09/2022 Cycle 8 FOLFOX 08/23/2022 Cycle 9 FOLFOX 09/06/2022 CTs 09/11/2022-stable, no new or progressive interval findings Cycle 10 FOLFOX 09/20/2022 Xeloda maintenance 2 weeks on/1 week off 10/16/2022 Cycle 2 Xeloda 11/06/2022-Xeloda dose reduced secondary to early foot skin toxicity Cycle 3 Xeloda 11/27/2022 Cycle 4 Xeloda 12/18/2022 CTs 01/02/2023-wall thickening of the mid gastric body-possibly progressive, unchanged peritoneal/omental caking, unchanged L1 compression fracture Cycle 5 Xeloda beginning 01/08/2023 Cycle 6 Xeloda beginning 01/29/2023 Cycle 7 Xeloda beginning 02/19/2023 Cycle 8 Xeloda beginning 03/12/2023 Cycle 9 Xeloda beginning 04/09/2023 Cycle 10 Xeloda beginning 05/07/2023 CTs 05/21/2023: Unchanged wall thickening of the gastric body, slight increase in small volume ascites, diffuse peritoneal thickening and stranding of the omentum, no evidence of metastatic disease to the chest Cycle 11 Xeloda beginning 05/28/2023 Cycle 12 Xeloda beginning 06/18/2023 Cycle 13 Xeloda beginning 07/09/2023 Cycle 14 Xeloda beginning 07/30/2023 CTA abdomen/pelvis 08/14/2023: Large volume of heterogenous material in the gastric lumen-food/blood, small volume ascites with diffuse peritoneal thickening and matting of the bowel/omentum, enhancement of the gastric body Truncal rash-status post recent punch biopsy with pathology pending; biopsy reported negative per family 04/26/2022 Lichen planus of the feet Hypothyroidism Atrial fibrillation Admission 07/24/2022 with acute bilateral pulmonary embolism/right heart strain Heparin anticoagulation 07/24/2022 Doppler 07/24/2022-acute DVT of the right femoral, popliteal, posterior tibial, and peroneal veins, acute left posterior tibial DVT Lovenox anticoagulation, changed to once daily dosing 05/25/2023 7.  Anemia secondary to chemotherapy,  phlebotomy, and hemoptysis-improved 8.  L1 compression fracture on plain x-ray 12/18/2022 MRI lumbar spine 02/02/2023-acute/subacute L1 compression fracture, mild degenerative change of the lumbar spine 9.  Admission 08/14/2023 with hematemesis   [name] has metastatic gastric cancer.  He was diagnosed with a gastric mass and carcinomatosis in December 2023.  He has been maintained on systemic therapy since 05/04/2022 and there is been no evidence of disease progression.  He has not required a recurrent paracentesis since 04/24/2022.  [name] is currently treated with capecitabine maintenance.  I reviewed the admission CT images.  There is no obvious evidence of disease progression, though the ascites appears slightly increased.  He is scheduled to resume capecitabine next week.  We will consider salvage systemic treatment options if he has clear evidence of disease progression. I recommend a repeat endoscopy for repeat staging.  We can consider palliative radiation to the stomach if there appears to be significant risk of repeat bleeding.  The hemoglobin is slightly lower today.  No clinical evidence of active bleeding.   Recommendations: Monitor hemoglobin, transfuse packed red blood cells as needed Hold anticoagulation Antiacid therapy Consider GI consult for a restaging endoscopy Consider palliative radiation to the stomach if a restaging EGD reveals evidence of significant remaining tumor and bleeding risk Resume capecitabine as an outpatient Outpatient follow-up at the Cancer center  LOS: 2 days   Thornton Papas, MD   08/16/2023, 7:21 AM

## 2023-08-16 NOTE — Progress Notes (Addendum)
 The patient is complaining of ear itch and he is asking for something for it. Notified PCCM RN on call and she said she will notify the provider.  Awaiting for order,

## 2023-08-17 DIAGNOSIS — K922 Gastrointestinal hemorrhage, unspecified: Secondary | ICD-10-CM | POA: Diagnosis not present

## 2023-08-17 DIAGNOSIS — C161 Malignant neoplasm of fundus of stomach: Secondary | ICD-10-CM | POA: Diagnosis not present

## 2023-08-17 DIAGNOSIS — K920 Hematemesis: Secondary | ICD-10-CM

## 2023-08-17 DIAGNOSIS — E440 Moderate protein-calorie malnutrition: Secondary | ICD-10-CM

## 2023-08-17 DIAGNOSIS — D62 Acute posthemorrhagic anemia: Secondary | ICD-10-CM

## 2023-08-17 DIAGNOSIS — R578 Other shock: Secondary | ICD-10-CM

## 2023-08-17 DIAGNOSIS — C162 Malignant neoplasm of body of stomach: Secondary | ICD-10-CM | POA: Diagnosis not present

## 2023-08-17 DIAGNOSIS — C169 Malignant neoplasm of stomach, unspecified: Secondary | ICD-10-CM | POA: Diagnosis not present

## 2023-08-17 LAB — CBC WITH DIFFERENTIAL/PLATELET
Abs Immature Granulocytes: 0.03 10*3/uL (ref 0.00–0.07)
Basophils Absolute: 0.1 10*3/uL (ref 0.0–0.1)
Basophils Relative: 1 %
Eosinophils Absolute: 0.2 10*3/uL (ref 0.0–0.5)
Eosinophils Relative: 3 %
HCT: 26.3 % — ABNORMAL LOW (ref 39.0–52.0)
Hemoglobin: 8.9 g/dL — ABNORMAL LOW (ref 13.0–17.0)
Immature Granulocytes: 0 %
Lymphocytes Relative: 15 %
Lymphs Abs: 1.1 10*3/uL (ref 0.7–4.0)
MCH: 32.1 pg (ref 26.0–34.0)
MCHC: 33.8 g/dL (ref 30.0–36.0)
MCV: 94.9 fL (ref 80.0–100.0)
Monocytes Absolute: 0.7 10*3/uL (ref 0.1–1.0)
Monocytes Relative: 8 %
Neutro Abs: 5.8 10*3/uL (ref 1.7–7.7)
Neutrophils Relative %: 73 %
Platelets: 175 10*3/uL (ref 150–400)
RBC: 2.77 MIL/uL — ABNORMAL LOW (ref 4.22–5.81)
RDW: 19.5 % — ABNORMAL HIGH (ref 11.5–15.5)
WBC: 7.9 10*3/uL (ref 4.0–10.5)
nRBC: 0 % (ref 0.0–0.2)

## 2023-08-17 LAB — BASIC METABOLIC PANEL WITH GFR
Anion gap: 9 (ref 5–15)
BUN: 16 mg/dL (ref 8–23)
CO2: 23 mmol/L (ref 22–32)
Calcium: 8 mg/dL — ABNORMAL LOW (ref 8.9–10.3)
Chloride: 108 mmol/L (ref 98–111)
Creatinine, Ser: 0.98 mg/dL (ref 0.61–1.24)
GFR, Estimated: >60 mL/min (ref >60)
Glucose, Bld: 99 mg/dL (ref 70–99)
Potassium: 3.8 mmol/L (ref 3.5–5.1)
Sodium: 140 mmol/L (ref 135–145)

## 2023-08-17 MED ORDER — ACETAMINOPHEN 160 MG/5ML PO SOLN
650.0000 mg | ORAL | Status: DC | PRN
  Administered 2023-08-17 – 2023-08-18 (×2): 650 mg via ORAL
  Filled 2023-08-17 (×2): qty 20.3

## 2023-08-17 MED ORDER — TRAMADOL HCL 50 MG PO TABS
50.0000 mg | ORAL_TABLET | Freq: Four times a day (QID) | ORAL | Status: DC | PRN
  Administered 2023-08-17 – 2023-08-18 (×3): 50 mg via ORAL
  Filled 2023-08-17 (×3): qty 1

## 2023-08-17 MED ORDER — LEVOTHYROXINE SODIUM 112 MCG PO TABS
112.0000 ug | ORAL_TABLET | Freq: Every day | ORAL | Status: DC
  Administered 2023-08-18: 112 ug via ORAL
  Filled 2023-08-17: qty 1

## 2023-08-17 NOTE — Plan of Care (Signed)
  Problem: Education: Goal: Knowledge of General Education information will improve Description: Including pain rating scale, medication(s)/side effects and non-pharmacologic comfort measures Outcome: Progressing   Problem: Clinical Measurements: Goal: Will remain free from infection Outcome: Progressing Goal: Respiratory complications will improve Outcome: Progressing Goal: Cardiovascular complication will be avoided Outcome: Progressing   Problem: Activity: Goal: Risk for activity intolerance will decrease Outcome: Progressing   Problem: Clinical Measurements: Goal: Diagnostic test results will improve Outcome: Not Progressing

## 2023-08-17 NOTE — Consult Note (Signed)
 WOC Nurse Consult Note: Reason for Consult: Consult requested for skin tears to right arm. Right elbow partial thickness wound 1X1X.1cm, 50% red, 50% pink, small amt yellow drainage Right lower forearm partial thickness wound 2X2X.2cm, red and moist, 50% skin flap intact over wound, small amt yellow drainage.  Dressing procedure/placement/frequency: Topical treatment orders provided for bedside nurses to perform as follows to promote moist healing: 1. Apply Xeroform gauze to right arm wound Q day, then cover with foam dressing.  Change foam dressing q 3 days or PRN soiling. 2. Foam dressing to right elbow wound, Change Q 3 days or PRN soiling. Please re-consult if further assistance is needed.  Thank-you,  Cammie Mcgee MSN, RN, CWOCN, [place], CNS [phone]

## 2023-08-17 NOTE — Progress Notes (Signed)
 PT Cancellation Note  Patient Details Name: Todd Harrison MRN: [id number] DOB: [birth year]   Cancelled Treatment:    Reason Eval/Treat Not Completed: Other (comment). PT attempted x 2. Pt declined first attempt due to NPO awaiting possible procedure. Upon 2nd attempt, pt now with diet order as he will not be undergoing procedure. However, still hasn't eaten, awaiting tray, and therefore declining. PT to re-attempt as time allows.   Ilda Foil 08/17/2023, 11:53 AM

## 2023-08-17 NOTE — Consult Note (Addendum)
 Consultation  Referring Provider: Oncology/Dr. Truett Perna Primary Care Physician:  Burton Apley, MD Primary Gastroenterologist:  Dr. Leone Payor  Reason for Consultation:  Gastric cancer, admit with GI bleeding , restaging for potential radiation  HPI: Todd Harrison is a 78 y.o. male with diagnosis of metastatic gastric cancer with abdominal carcinomatosis.  Diagnosed in December 2023.  He had EGD at that time that showed a large infiltrative mass with oozing and bleeding and stigmata of bleeding in the gastric fundus, anterior wall of the gastric body and greater curvature.  There was some extrinsic deformity in the entire duodenum.  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 He has been on treatment with FOLFOX, then started Xeloda in June 2024. He was admitted in March 2024 with acute bilateral pulmonary emboli with right heart strain.  He was treated with heparin, found to have an acute DVT on the right as well as a left posterior tibial DVT.  He has been on Lovenox since. Admitted here on 08/14/2023 with hematemesis up he had been in the ER also on 08/11/2023 after he had fallen and hit the back of his head.  CT of the cervical spine and head were negative. On 08/14/2023 he had 2 episodes of nausea and vomiting of red blood. Admitted, Lovenox on hold since.  We were initially contacted about the patient but had advised that there was no endoscopic therapy for bleeding in setting of malignancy. CTA on admission showed no evidence of active bleeding, evidence of peritoneal carcinomatosis present, small volume of ascites trace pleural effusions. There was a large volume of hyperdense material in the gastric lumen presumably food and blood mixed, and prominent enhancement of the gastric body keeping with known primary gastric malignancy and/or varices.  Off Lovenox he is no longer having any evidence of bleeding. He is hoping to be discharged tomorrow as  they have a family trip to [place] or planning to leave tomorrow.  Hemoglobin was 11.1 on 08/14/2023> 10.2 on 4/9> 9.6 yesterday and 8.9 today.  Dr. Truett Perna is requesting EGD to reassess the gastric tumor and for additional staging so decision can be made whether he would benefit from gastric radiation.    Past Medical History:  Diagnosis Date   A-fib (HCC)    Asthma    Cataract    Gastric cancer (HCC)    GERD (gastroesophageal reflux disease)    Lichen planus    Methotrexate, long term, current use    no longer taking   Psoriasis    Sleep apnea    Thyroid disease    Urticaria     Past Surgical History:  Procedure Laterality Date   CARDIOVERSION N/A 02/23/2020   Procedure: CARDIOVERSION;  Surgeon: Christell Constant, MD;  Location: [facility] ENDOSCOPY;  Service: Cardiovascular;  Laterality: N/A;   CARDIOVERSION N/A 04/12/2020   Procedure: CARDIOVERSION;  Surgeon: Jodelle Red, MD;  Location: [facility] ENDOSCOPY;  Service: Cardiovascular;  Laterality: N/A;   CATARACT EXTRACTION Left    2019   IR IMAGING GUIDED PORT INSERTION  05/03/2022   TONSILLECTOMY      Prior to Admission medications   Medication Sig Start Date End Date Taking? Authorizing Provider  acetaminophen (TYLENOL) 500 MG tablet Take 500 mg by mouth every 6 (six) hours as needed for mild pain (pain score 1-3).   Yes [provider]  acitretin (SORIATANE) 25 MG capsule Take 25 mg by mouth daily. 01/09/23  Yes [provider]  albuterol (VENTOLIN  HFA) 108 (90 Base) MCG/ACT inhaler TAKE 2 PUFFS BY MOUTH EVERY 6 HOURS AS NEEDED FOR WHEEZE OR SHORTNESS OF BREATH 03/22/23  Yes Ladene Artist, MD  capecitabine (XELODA) 500 MG tablet Take 3 tablets (1500 mg) by mouth in AM and 2 tablets (1000 mg) in PM. Take with food. Take for 14 days, then hold for 7 days. Repeat every 21 days. Start cycle on 07/30/23 07/27/23  Yes Ladene Artist, MD  clobetasol ointment (TEMOVATE) 0.05 % Apply 1 Application topically  2 (two) times daily. 01/02/23  Yes [provider]  enoxaparin (LOVENOX) 120 MG/0.8ML injection Inject 0.8 mLs (120 mg total) into the skin every 12 (twelve) hours. Patient taking differently: Inject 120 mg into the skin daily. 12/28/22  Yes Ladene Artist, MD  famotidine (PEPCID) 40 MG tablet Take 40 mg by mouth daily. 07/29/23  Yes [provider]  fluticasone (FLONASE) 50 MCG/ACT nasal spray Place 1 spray into both nostrils daily as needed for allergies or rhinitis.   Yes [provider]  fluticasone furoate-vilanterol (BREO ELLIPTA) 100-25 MCG/ACT AEPB Inhale 1 puff into the lungs daily. 07/03/22  Yes Icard, Rachel Bo, DO  hydrOXYzine (ATARAX) 25 MG tablet Take 25 mg by mouth 3 (three) times daily as needed for itching. 04/19/23  Yes [provider]  levothyroxine (SYNTHROID) 112 MCG tablet Take 112 mcg by mouth daily before breakfast.   Yes [provider]  metoprolol succinate (TOPROL-XL) 50 MG 24 hr tablet TAKE 1 TABLET BY MOUTH EVERY DAY 05/30/23  Yes Lanier Prude, MD  ondansetron (ZOFRAN) 4 MG tablet Take 1 tablet (4 mg total) by mouth every 6 (six) hours. Patient taking differently: Take 4 mg by mouth every 8 (eight) hours as needed for vomiting. 08/11/23  Yes Wynetta Fines, MD  oxyCODONE-acetaminophen (PERCOCET/ROXICET) 5-325 MG tablet Take 1 tablet by mouth daily as needed for severe pain (Pain not relieved with tramadol). 02/15/23  Yes Ladene Artist, MD  polyethylene glycol (MIRALAX / GLYCOLAX) 17 g packet Take 17 g by mouth every evening.   Yes [provider]  potassium chloride (KLOR-CON M10) 10 MEQ tablet Take 2 tablets (20 mEq total) by mouth daily. 06/15/23  Yes Ladene Artist, MD  prochlorperazine (COMPAZINE) 10 MG tablet Take 1 tablet (10 mg total) by mouth every 6 (six) hours as needed for nausea or vomiting. 05/04/23  Yes Rana Snare, NP  traMADol (ULTRAM) 50 MG tablet Take 1 tablet (50 mg total) by mouth every 6  (six) hours as needed for moderate pain. 01/17/23  Yes Ladene Artist, MD  triamcinolone (KENALOG) 0.1 % Apply 1 application  topically daily as needed (lichen planus flare).   Yes [provider]  lidocaine-prilocaine (EMLA) cream Apply 1 Application topically as needed (Apply 1 hour before coming to Cancer [facility] for accessing). 04/27/22   Ladene Artist, MD  loratadine (CLARITIN) 10 MG tablet Take 10 mg by mouth daily.    [provider]  methylPREDNISolone (MEDROL DOSEPAK) 4 MG TBPK tablet Take by mouth daily at 6 (six) AM. Take 32 mg (8 tab) daily x 1 day, then 20 mg (5 tab) daily x 2 days, then 12 mg (3 tab) daily x 2 days, then 8 mg (2 tab) daily x 1 day and then stop Patient not taking: Reported on 05/25/2023 05/10/23   Ladene Artist, MD  omeprazole (PRILOSEC) 40 MG capsule Take 40 mg by mouth daily. Patient not taking: Reported on 08/15/2023  04/12/22   [provider]  ondansetron (ZOFRAN) 8 MG tablet Take 1 tablet (8 mg total) by mouth every 8 (eight) hours as needed for nausea or vomiting. Do not take within 72 hours of day 1 chemo Patient not taking: Reported on 08/15/2023 09/20/22   Rana Snare, NP    Current Facility-Administered Medications  Medication Dose Route Frequency Provider Last Rate Last Admin   acetaminophen (TYLENOL) 160 MG/5ML solution 650 mg  650 mg Oral Q4H PRN Cheri Fowler, MD   650 mg at 08/17/23 1507   Chlorhexidine Gluconate Cloth 2 % PADS 6 each  6 each Topical Daily Cheri Fowler, MD   6 each at 08/17/23 0841   docusate sodium (COLACE) capsule 100 mg  100 mg Oral BID PRN Cheri Fowler, MD       feeding supplement (ENSURE ENLIVE / ENSURE PLUS) liquid 237 mL  237 mL Oral BID BM Cheri Fowler, MD   237 mL at 08/16/23 0954   hydrocortisone cream 1 %   Topical BID PRN Migdalia Dk, MD   Given at 08/17/23 0146   ondansetron (ZOFRAN) injection 4 mg  4 mg Intravenous Q6H PRN Oretha Milch, MD       Oral care mouth rinse  15 mL Mouth  Rinse PRN Cheri Fowler, MD       pantoprazole (PROTONIX) injection 40 mg  40 mg Intravenous Q12H Oretha Milch, MD   40 mg at 08/17/23 0841   polyethylene glycol (MIRALAX / GLYCOLAX) packet 17 g  17 g Oral Daily PRN Cheri Fowler, MD        Allergies as of 08/14/2023 - Reviewed 08/14/2023  Allergen Reaction Noted   Gold Itching 10/20/2019   Mixed grasses  10/20/2019    Family History  Problem Relation Age of Onset   Diabetes Mother    Stroke Mother    Leukemia Maternal Great-grandmother    Allergic rhinitis Neg Hx    Asthma Neg Hx    Eczema Neg Hx    Urticaria Neg Hx    Colon cancer Neg Hx    Esophageal cancer Neg Hx    Stomach cancer Neg Hx     Social History   Socioeconomic History   Marital status: Married    Spouse name: Not on file   Number of children: Not on file   Years of education: Not on file   Highest education level: Not on file  Occupational History   Not on file  Tobacco Use   Smoking status: Former    Current packs/day: 0.00    Types: Cigarettes    Quit date: 1982    Years since quitting: 43.3   Smokeless tobacco: Never  Vaping Use   Vaping status: Not on file  Substance and Sexual Activity   Alcohol use: Yes    Alcohol/week: 1.0 standard drink of alcohol    Types: 1 Glasses of wine per week    Comment: occasionally   Drug use: No   Sexual activity: Not on file  Other Topics Concern   Not on file  Social History Narrative   Married with 1 son and 1 daughter   Former smoker no alcohol no caffeine no tobacco or drug use   Social Drivers of Corporate investment banker Strain: Low Risk  (04/17/2022)   Overall Financial Resource Strain (CARDIA)    Difficulty of Paying Living Expenses: Not hard at all  Food Insecurity: No Food Insecurity (08/15/2023)   Hunger  Vital Sign    Worried About Programme researcher, broadcasting/film/video in the Last Year: Never true    Ran Out of Food in the Last Year: Never true  Transportation Needs: No Transportation Needs (08/15/2023)    PRAPARE - Administrator, Civil Service (Medical): No    Lack of Transportation (Non-Medical): No  Physical Activity: Not on file  Stress: No Stress Concern Present (04/17/2022)   [employer] of Occupational Health - Occupational Stress Questionnaire    Feeling of Stress : Not at all  Social Connections: Patient Unable To Answer (08/15/2023)   Social Connection and Isolation Panel [NHANES]    Frequency of Communication with Friends and Family: Patient unable to answer    Frequency of Social Gatherings with Friends and Family: Patient unable to answer    Attends Religious Services: Patient unable to answer    Active Member of Clubs or Organizations: Patient unable to answer    Attends Banker Meetings: Patient unable to answer    Marital Status: Patient unable to answer  Intimate Partner Violence: Not At Risk (08/15/2023)   Humiliation, Afraid, Rape, and Kick questionnaire    Fear of Current or Ex-Partner: No    Emotionally Abused: No    Physically Abused: No    Sexually Abused: No    Review of Systems: Pertinent positive and negative review of systems were noted in the above HPI section.  All other review of systems was otherwise negative.   Physical Exam: Vital signs in last 24 hours: Temp:  [97.3 F (36.3 C)-98.1 F (36.7 C)] 97.3 F (36.3 C) (04/11 1143) Pulse Rate:  [69-95] 69 (04/11 1143) Resp:  [14-21] 15 (04/11 1143) BP: (92-103)/(53-61) 99/60 (04/11 1143) SpO2:  [93 %-100 %] 94 % (04/11 1143) Weight:  [110.2 kg] 110.2 kg (04/11 0426) Last BM Date : 08/15/23 General:   Alert,  Well-developed, chronically ill-appearing elderly white male pleasant and cooperative in NAD sitting up in chair, wife at bedside Head:  Normocephalic and atraumatic. Eyes:  Sclera clear, no icterus.   Conjunctiva pale Ears:  Normal auditory acuity. Nose:  No deformity, discharge,  or lesions. Mouth:  No deformity or lesions.   Neck:  Supple; no masses or  thyromegaly. Lungs:  Clear throughout to auscultation.   No wheezes, crackles, or rhonchi.  Heart:  Regular rate and rhythm; no murmurs, clicks, rubs,  or gallops. Abdomen:  Soft, obese, no focal tenderness, no obvious fluid wave, no palpable hepatosplenomegaly, though somewhat fulfilling. Rectal: Not done Msk:  Symmetrical without gross deformities. . Pulses:  Normal pulses noted. Extremities:  Without clubbing or edema. Neurologic:  Alert and  oriented x4;  grossly normal neurologically. Skin:  Intact without significant lesions or rashes.. Psych:  Alert and cooperative. Normal mood and affect.  Intake/Output from previous day: 04/10 0701 - 04/11 0700 In: -  Out: 860 [Urine:860] Intake/Output this shift: No intake/output data recorded.  Lab Results: Recent Labs    08/14/23 2340 08/15/23 0320 08/15/23 0411 08/15/23 1130 08/16/23 0431 08/17/23 1059  WBC 10.8*  --  9.7  --   --  7.9  HGB 10.5*   < > 10.8* 10.6* 9.6* 8.9*  HCT 31.7*   < > 32.0* 31.4* 29.0* 26.3*  PLT 217  --  230  --   --  175   < > = values in this interval not displayed.   BMET Recent Labs    08/15/23 0411 08/16/23 0714 08/17/23 0500  NA 137  135 140  K 4.3 3.5 3.8  CL 107 104 108  CO2 21* 22 23  GLUCOSE 135* 121* 99  BUN 31* 25* 16  CREATININE 1.12 1.01 0.98  CALCIUM 7.6* 7.9* 8.0*   LFT Recent Labs    08/14/23 1657  PROT 6.3*  ALBUMIN 3.0*  AST 15  ALT 11  ALKPHOS 54  BILITOT 0.8   PT/INR No results for input(s): "LABPROT", "INR" in the last 72 hours. Hepatitis Panel No results for input(s): "HEPBSAG", "HCVAB", "HEPAIGM", "HEPBIGM" in the last 72 hours.    IMPRESSION:  #67 78 year old white male with metastatic gastric cancer/peritoneal carcinomatosis newly diagnosed in December 2023 who has been undergoing active chemotherapy since.  Noticed with lateral PEs and DVTs in March 2025 and now on Lovenox as an outpatient.  Onset of hematemesis on 08/14/2023 and admitted. Lovenox has  been on hold  Bleeding has resolved, though hemoglobin has gradually drifted over the past 2 days not had any evidence of active bleeding.  We were initially contacted on admission regarding potential indication for EGD for hemostatic therapy which unfortunately is not helpful in setting of malignancy.  Dr. Truett Perna is considering other options for this patient and requesting EGD to reassess the primary tumor for staging purposes and for consideration of gastric radiation.  #2 anemia acute on chronic secondary to above  Plan; okay for diet this evening, n.p.o. after midnight Repeat hemoglobin in a.m. Patient will be scheduled for EGD with Dr. Myrtie Neither.  Procedure was discussed with the patient in detail including indications risks and benefits and he is agreeable to proceed.  Patient is hoping to be discharged tomorrow so they can leave on their trip to [place].  Advised we would get the endoscopy done in the morning.      Amy EsterwoodPA-C  08/17/2023, 3:42 PM  I have taken an interval history, thoroughly reviewed the chart and examined the patient. I agree with the Advanced Practitioner's note, impression and recommendations, and have recorded additional findings, impressions and recommendations below. I performed a substantive portion of this encounter (>50% time spent), including a complete performance of the medical decision making.  My additional thoughts are as follows:  Complex scenario of metastatic gastric cancer with recurrent bleeding presumably from his mass, seems much less likely from an alternate source such as an ulcer.  May be radiation induced gastropathy as well. DVT, needs anticoagulation but keeps bleeding, anticoagulation now on hold.  I spoke at length with this patient and his wife and earlier with Dr. Truett Perna. My contention is that there is almost certainly no endoscopic solution to this bleeding.  However, Dr. Truett Perna is also requesting an upper endoscopy for  visual assessment of the mass so he can decide whether or not to resume anticoagulation and perhaps which agent at what dose.  He is also trying to understand the status of the patient's tumor because his malignancy seems relatively stable on imaging on his current treatment regimen.  We have planned an upper endoscopy tomorrow and he was agreeable after discussion of procedure and risks.    Charlie Pitter III Office:[phone]

## 2023-08-17 NOTE — Progress Notes (Signed)
 New order for Hydrocortisone cream received from Dr. Marnette Burgess, Will administer and continue to monitor.

## 2023-08-17 NOTE — H&P (View-Only) (Signed)
 Consultation  Referring Provider: Oncology/Dr. Truett Perna Primary Care Physician:  Burton Apley, MD Primary Gastroenterologist:  Dr. Leone Payor  Reason for Consultation:  Gastric cancer, admit with GI bleeding , restaging for potential radiation  HPI: Todd Harrison is a 78 y.o. male with diagnosis of metastatic gastric cancer with abdominal carcinomatosis.  Diagnosed in December 2023.  He had EGD at that time that showed a large infiltrative mass with oozing and bleeding and stigmata of bleeding in the gastric fundus, anterior wall of the gastric body and greater curvature.  There was some extrinsic deformity in the entire duodenum.  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 He has been on treatment with FOLFOX, then started Xeloda in June 2024. He was admitted in March 2024 with acute bilateral pulmonary emboli with right heart strain.  He was treated with heparin, found to have an acute DVT on the right as well as a left posterior tibial DVT.  He has been on Lovenox since. Admitted here on 08/14/2023 with hematemesis up he had been in the ER also on 08/11/2023 after he had fallen and hit the back of his head.  CT of the cervical spine and head were negative. On 08/14/2023 he had 2 episodes of nausea and vomiting of red blood. Admitted, Lovenox on hold since.  We were initially contacted about the patient but had advised that there was no endoscopic therapy for bleeding in setting of malignancy. CTA on admission showed no evidence of active bleeding, evidence of peritoneal carcinomatosis present, small volume of ascites trace pleural effusions. There was a large volume of hyperdense material in the gastric lumen presumably food and blood mixed, and prominent enhancement of the gastric body keeping with known primary gastric malignancy and/or varices.  Off Lovenox he is no longer having any evidence of bleeding. He is hoping to be discharged tomorrow as  they have a family trip to [place] or planning to leave tomorrow.  Hemoglobin was 11.1 on 08/14/2023> 10.2 on 4/9> 9.6 yesterday and 8.9 today.  Dr. Truett Perna is requesting EGD to reassess the gastric tumor and for additional staging so decision can be made whether he would benefit from gastric radiation.    Past Medical History:  Diagnosis Date   A-fib (HCC)    Asthma    Cataract    Gastric cancer (HCC)    GERD (gastroesophageal reflux disease)    Lichen planus    Methotrexate, long term, current use    no longer taking   Psoriasis    Sleep apnea    Thyroid disease    Urticaria     Past Surgical History:  Procedure Laterality Date   CARDIOVERSION N/A 02/23/2020   Procedure: CARDIOVERSION;  Surgeon: Christell Constant, MD;  Location: [facility] ENDOSCOPY;  Service: Cardiovascular;  Laterality: N/A;   CARDIOVERSION N/A 04/12/2020   Procedure: CARDIOVERSION;  Surgeon: Jodelle Red, MD;  Location: [facility] ENDOSCOPY;  Service: Cardiovascular;  Laterality: N/A;   CATARACT EXTRACTION Left    2019   IR IMAGING GUIDED PORT INSERTION  05/03/2022   TONSILLECTOMY      Prior to Admission medications   Medication Sig Start Date End Date Taking? Authorizing Provider  acetaminophen (TYLENOL) 500 MG tablet Take 500 mg by mouth every 6 (six) hours as needed for mild pain (pain score 1-3).   Yes [provider]  acitretin (SORIATANE) 25 MG capsule Take 25 mg by mouth daily. 01/09/23  Yes [provider]  albuterol (VENTOLIN  HFA) 108 (90 Base) MCG/ACT inhaler TAKE 2 PUFFS BY MOUTH EVERY 6 HOURS AS NEEDED FOR WHEEZE OR SHORTNESS OF BREATH 03/22/23  Yes Ladene Artist, MD  capecitabine (XELODA) 500 MG tablet Take 3 tablets (1500 mg) by mouth in AM and 2 tablets (1000 mg) in PM. Take with food. Take for 14 days, then hold for 7 days. Repeat every 21 days. Start cycle on 07/30/23 07/27/23  Yes Ladene Artist, MD  clobetasol ointment (TEMOVATE) 0.05 % Apply 1 Application topically  2 (two) times daily. 01/02/23  Yes [provider]  enoxaparin (LOVENOX) 120 MG/0.8ML injection Inject 0.8 mLs (120 mg total) into the skin every 12 (twelve) hours. Patient taking differently: Inject 120 mg into the skin daily. 12/28/22  Yes Ladene Artist, MD  famotidine (PEPCID) 40 MG tablet Take 40 mg by mouth daily. 07/29/23  Yes [provider]  fluticasone (FLONASE) 50 MCG/ACT nasal spray Place 1 spray into both nostrils daily as needed for allergies or rhinitis.   Yes [provider]  fluticasone furoate-vilanterol (BREO ELLIPTA) 100-25 MCG/ACT AEPB Inhale 1 puff into the lungs daily. 07/03/22  Yes Icard, Rachel Bo, DO  hydrOXYzine (ATARAX) 25 MG tablet Take 25 mg by mouth 3 (three) times daily as needed for itching. 04/19/23  Yes [provider]  levothyroxine (SYNTHROID) 112 MCG tablet Take 112 mcg by mouth daily before breakfast.   Yes [provider]  metoprolol succinate (TOPROL-XL) 50 MG 24 hr tablet TAKE 1 TABLET BY MOUTH EVERY DAY 05/30/23  Yes Lanier Prude, MD  ondansetron (ZOFRAN) 4 MG tablet Take 1 tablet (4 mg total) by mouth every 6 (six) hours. Patient taking differently: Take 4 mg by mouth every 8 (eight) hours as needed for vomiting. 08/11/23  Yes Wynetta Fines, MD  oxyCODONE-acetaminophen (PERCOCET/ROXICET) 5-325 MG tablet Take 1 tablet by mouth daily as needed for severe pain (Pain not relieved with tramadol). 02/15/23  Yes Ladene Artist, MD  polyethylene glycol (MIRALAX / GLYCOLAX) 17 g packet Take 17 g by mouth every evening.   Yes [provider]  potassium chloride (KLOR-CON M10) 10 MEQ tablet Take 2 tablets (20 mEq total) by mouth daily. 06/15/23  Yes Ladene Artist, MD  prochlorperazine (COMPAZINE) 10 MG tablet Take 1 tablet (10 mg total) by mouth every 6 (six) hours as needed for nausea or vomiting. 05/04/23  Yes Rana Snare, NP  traMADol (ULTRAM) 50 MG tablet Take 1 tablet (50 mg total) by mouth every 6  (six) hours as needed for moderate pain. 01/17/23  Yes Ladene Artist, MD  triamcinolone (KENALOG) 0.1 % Apply 1 application  topically daily as needed (lichen planus flare).   Yes [provider]  lidocaine-prilocaine (EMLA) cream Apply 1 Application topically as needed (Apply 1 hour before coming to Cancer Center for accessing). 04/27/22   Ladene Artist, MD  loratadine (CLARITIN) 10 MG tablet Take 10 mg by mouth daily.    [provider]  methylPREDNISolone (MEDROL DOSEPAK) 4 MG TBPK tablet Take by mouth daily at 6 (six) AM. Take 32 mg (8 tab) daily x 1 day, then 20 mg (5 tab) daily x 2 days, then 12 mg (3 tab) daily x 2 days, then 8 mg (2 tab) daily x 1 day and then stop Patient not taking: Reported on 05/25/2023 05/10/23   Ladene Artist, MD  omeprazole (PRILOSEC) 40 MG capsule Take 40 mg by mouth daily. Patient not taking: Reported on 08/15/2023  04/12/22   [provider]  ondansetron (ZOFRAN) 8 MG tablet Take 1 tablet (8 mg total) by mouth every 8 (eight) hours as needed for nausea or vomiting. Do not take within 72 hours of day 1 chemo Patient not taking: Reported on 08/15/2023 09/20/22   Rana Snare, NP    Current Facility-Administered Medications  Medication Dose Route Frequency Provider Last Rate Last Admin   acetaminophen (TYLENOL) 160 MG/5ML solution 650 mg  650 mg Oral Q4H PRN Cheri Fowler, MD   650 mg at 08/17/23 1507   Chlorhexidine Gluconate Cloth 2 % PADS 6 each  6 each Topical Daily Cheri Fowler, MD   6 each at 08/17/23 0841   docusate sodium (COLACE) capsule 100 mg  100 mg Oral BID PRN Cheri Fowler, MD       feeding supplement (ENSURE ENLIVE / ENSURE PLUS) liquid 237 mL  237 mL Oral BID BM Cheri Fowler, MD   237 mL at 08/16/23 0954   hydrocortisone cream 1 %   Topical BID PRN Migdalia Dk, MD   Given at 08/17/23 0146   ondansetron (ZOFRAN) injection 4 mg  4 mg Intravenous Q6H PRN Oretha Milch, MD       Oral care mouth rinse  15 mL Mouth  Rinse PRN Cheri Fowler, MD       pantoprazole (PROTONIX) injection 40 mg  40 mg Intravenous Q12H Oretha Milch, MD   40 mg at 08/17/23 0841   polyethylene glycol (MIRALAX / GLYCOLAX) packet 17 g  17 g Oral Daily PRN Cheri Fowler, MD        Allergies as of 08/14/2023 - Reviewed 08/14/2023  Allergen Reaction Noted   Gold Itching 10/20/2019   Mixed grasses  10/20/2019    Family History  Problem Relation Age of Onset   Diabetes Mother    Stroke Mother    Leukemia Maternal Great-grandmother    Allergic rhinitis Neg Hx    Asthma Neg Hx    Eczema Neg Hx    Urticaria Neg Hx    Colon cancer Neg Hx    Esophageal cancer Neg Hx    Stomach cancer Neg Hx     Social History   Socioeconomic History   Marital status: Married    Spouse name: Not on file   Number of children: Not on file   Years of education: Not on file   Highest education level: Not on file  Occupational History   Not on file  Tobacco Use   Smoking status: Former    Current packs/day: 0.00    Types: Cigarettes    Quit date: 1982    Years since quitting: 43.3   Smokeless tobacco: Never  Vaping Use   Vaping status: Not on file  Substance and Sexual Activity   Alcohol use: Yes    Alcohol/week: 1.0 standard drink of alcohol    Types: 1 Glasses of wine per week    Comment: occasionally   Drug use: No   Sexual activity: Not on file  Other Topics Concern   Not on file  Social History Narrative   Married with 1 son and 1 daughter   Former smoker no alcohol no caffeine no tobacco or drug use   Social Drivers of Corporate investment banker Strain: Low Risk  (04/17/2022)   Overall Financial Resource Strain (CARDIA)    Difficulty of Paying Living Expenses: Not hard at all  Food Insecurity: No Food Insecurity (08/15/2023)   Hunger  Vital Sign    Worried About Programme researcher, broadcasting/film/video in the Last Year: Never true    Ran Out of Food in the Last Year: Never true  Transportation Needs: No Transportation Needs (08/15/2023)    PRAPARE - Administrator, Civil Service (Medical): No    Lack of Transportation (Non-Medical): No  Physical Activity: Not on file  Stress: No Stress Concern Present (04/17/2022)   [employer] of Occupational Health - Occupational Stress Questionnaire    Feeling of Stress : Not at all  Social Connections: Patient Unable To Answer (08/15/2023)   Social Connection and Isolation Panel [NHANES]    Frequency of Communication with Friends and Family: Patient unable to answer    Frequency of Social Gatherings with Friends and Family: Patient unable to answer    Attends Religious Services: Patient unable to answer    Active Member of Clubs or Organizations: Patient unable to answer    Attends Banker Meetings: Patient unable to answer    Marital Status: Patient unable to answer  Intimate Partner Violence: Not At Risk (08/15/2023)   Humiliation, Afraid, Rape, and Kick questionnaire    Fear of Current or Ex-Partner: No    Emotionally Abused: No    Physically Abused: No    Sexually Abused: No    Review of Systems: Pertinent positive and negative review of systems were noted in the above HPI section.  All other review of systems was otherwise negative.   Physical Exam: Vital signs in last 24 hours: Temp:  [97.3 F (36.3 C)-98.1 F (36.7 C)] 97.3 F (36.3 C) (04/11 1143) Pulse Rate:  [69-95] 69 (04/11 1143) Resp:  [14-21] 15 (04/11 1143) BP: (92-103)/(53-61) 99/60 (04/11 1143) SpO2:  [93 %-100 %] 94 % (04/11 1143) Weight:  [110.2 kg] 110.2 kg (04/11 0426) Last BM Date : 08/15/23 General:   Alert,  Well-developed, chronically ill-appearing elderly white male pleasant and cooperative in NAD sitting up in chair, wife at bedside Head:  Normocephalic and atraumatic. Eyes:  Sclera clear, no icterus.   Conjunctiva pale Ears:  Normal auditory acuity. Nose:  No deformity, discharge,  or lesions. Mouth:  No deformity or lesions.   Neck:  Supple; no masses or  thyromegaly. Lungs:  Clear throughout to auscultation.   No wheezes, crackles, or rhonchi.  Heart:  Regular rate and rhythm; no murmurs, clicks, rubs,  or gallops. Abdomen:  Soft, obese, no focal tenderness, no obvious fluid wave, no palpable hepatosplenomegaly, though somewhat fulfilling. Rectal: Not done Msk:  Symmetrical without gross deformities. . Pulses:  Normal pulses noted. Extremities:  Without clubbing or edema. Neurologic:  Alert and  oriented x4;  grossly normal neurologically. Skin:  Intact without significant lesions or rashes.. Psych:  Alert and cooperative. Normal mood and affect.  Intake/Output from previous day: 04/10 0701 - 04/11 0700 In: -  Out: 860 [Urine:860] Intake/Output this shift: No intake/output data recorded.  Lab Results: Recent Labs    08/14/23 2340 08/15/23 0320 08/15/23 0411 08/15/23 1130 08/16/23 0431 08/17/23 1059  WBC 10.8*  --  9.7  --   --  7.9  HGB 10.5*   < > 10.8* 10.6* 9.6* 8.9*  HCT 31.7*   < > 32.0* 31.4* 29.0* 26.3*  PLT 217  --  230  --   --  175   < > = values in this interval not displayed.   BMET Recent Labs    08/15/23 0411 08/16/23 0714 08/17/23 0500  NA 137  135 140  K 4.3 3.5 3.8  CL 107 104 108  CO2 21* 22 23  GLUCOSE 135* 121* 99  BUN 31* 25* 16  CREATININE 1.12 1.01 0.98  CALCIUM 7.6* 7.9* 8.0*   LFT Recent Labs    08/14/23 1657  PROT 6.3*  ALBUMIN 3.0*  AST 15  ALT 11  ALKPHOS 54  BILITOT 0.8   PT/INR No results for input(s): "LABPROT", "INR" in the last 72 hours. Hepatitis Panel No results for input(s): "HEPBSAG", "HCVAB", "HEPAIGM", "HEPBIGM" in the last 72 hours.    IMPRESSION:  #67 78 year old white male with metastatic gastric cancer/peritoneal carcinomatosis newly diagnosed in December 2023 who has been undergoing active chemotherapy since.  Noticed with lateral PEs and DVTs in March 2025 and now on Lovenox as an outpatient.  Onset of hematemesis on 08/14/2023 and admitted. Lovenox has  been on hold  Bleeding has resolved, though hemoglobin has gradually drifted over the past 2 days not had any evidence of active bleeding.  We were initially contacted on admission regarding potential indication for EGD for hemostatic therapy which unfortunately is not helpful in setting of malignancy.  Dr. Truett Perna is considering other options for this patient and requesting EGD to reassess the primary tumor for staging purposes and for consideration of gastric radiation.  #2 anemia acute on chronic secondary to above  Plan; okay for diet this evening, n.p.o. after midnight Repeat hemoglobin in a.m. Patient will be scheduled for EGD with Dr. Myrtie Neither.  Procedure was discussed with the patient in detail including indications risks and benefits and he is agreeable to proceed.  Patient is hoping to be discharged tomorrow so they can leave on their trip to [place].  Advised we would get the endoscopy done in the morning.      Amy EsterwoodPA-C  08/17/2023, 3:42 PM  I have taken an interval history, thoroughly reviewed the chart and examined the patient. I agree with the Advanced Practitioner's note, impression and recommendations, and have recorded additional findings, impressions and recommendations below. I performed a substantive portion of this encounter (>50% time spent), including a complete performance of the medical decision making.  My additional thoughts are as follows:  Complex scenario of metastatic gastric cancer with recurrent bleeding presumably from his mass, seems much less likely from an alternate source such as an ulcer.  May be radiation induced gastropathy as well. DVT, needs anticoagulation but keeps bleeding, anticoagulation now on hold.  I spoke at length with this patient and his wife and earlier with Dr. Truett Perna. My contention is that there is almost certainly no endoscopic solution to this bleeding.  However, Dr. Truett Perna is also requesting an upper endoscopy for  visual assessment of the mass so he can decide whether or not to resume anticoagulation and perhaps which agent at what dose.  He is also trying to understand the status of the patient's tumor because his malignancy seems relatively stable on imaging on his current treatment regimen.  We have planned an upper endoscopy tomorrow and he was agreeable after discussion of procedure and risks.    Charlie Pitter III Office:[phone]

## 2023-08-17 NOTE — Progress Notes (Signed)
 IP PROGRESS NOTE  Subjective:   Todd Harrison reports no recurrent nausea or hematemesis.  He tolerated diet last night. Objective: Vital signs in last 24 hours: Blood pressure (!) 94/53, pulse 70, temperature 98.1 F (36.7 C), temperature source Oral, resp. rate 17, height 6' (1.829 m), weight 242 lb 15.2 oz (110.2 kg), SpO2 95%.  Intake/Output from previous day: 04/10 0701 - 04/11 0700 In: -  Out: 860 [Urine:860]  Physical Exam:  HEENT: No thrush or ulcers Lungs: Clear bilaterally Cardiac: Regular rate and rhythm Abdomen: Nondistended, soft, no mass, no hepatosplenomegaly Extremities: No leg edema   Portacath/PICC-without erythema  Lab Results: Recent Labs    08/14/23 2340 08/15/23 0320 08/15/23 0411 08/15/23 1130 08/16/23 0431  WBC 10.8*  --  9.7  --   --   HGB 10.5*   < > 10.8* 10.6* 9.6*  HCT 31.7*   < > 32.0* 31.4* 29.0*  PLT 217  --  230  --   --    < > = values in this interval not displayed.    BMET Recent Labs    08/16/23 0714 08/17/23 0500  NA 135 140  K 3.5 3.8  CL 104 108  CO2 22 23  GLUCOSE 121* 99  BUN 25* 16  CREATININE 1.01 0.98  CALCIUM 7.9* 8.0*    Lab Results  Component Value Date   CEA1 <0.6 04/13/2022    Studies/Results: No results found.   Medications: I have reviewed the patient's current medications.  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  Internal hemorrhoids.  No specimens collected. Upper endoscopy 04/25/2022-large infiltrative mass with oozing bleeding and  stigmata of recent bleeding in the gastric fundus, on the anterior wall of the gastric body and on the greater curvature of the gastric body; deformity in the gastric antrum.  Extrinsic deformity in the entire duodenum.  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 Her2 by IHC negative, 1+; MMR IHC normal; PD-L1 CPS 1%, positive.  Cycle 1 FOLFOX 05/04/2022 Cycle 2 FOLFOX 05/17/2022 Cycle 3 FOLFOX 05/31/2022-oxaliplatin dose reduced secondary to prolonged cold sensitivity Cycle 4 FOLFOX 06/14/2022 Cycle 5 FOLFOX 06/28/2022 CT abdomen/pelvis 07/08/2022-asymmetric wall thickening at the lesser curvature of the stomach, decreased ascites, increased diffuse omental and peritoneal nodularity Cycle 6 FOLFOX 07/12/2022 Cycle 7 FOLFOX 08/09/2022 Cycle 8 FOLFOX 08/23/2022 Cycle 9 FOLFOX 09/06/2022 CTs 09/11/2022-stable, no new or progressive interval findings Cycle 10 FOLFOX 09/20/2022 Xeloda maintenance 2 weeks on/1 week off 10/16/2022 Cycle 2 Xeloda 11/06/2022-Xeloda dose reduced secondary to early foot skin toxicity Cycle 3 Xeloda 11/27/2022 Cycle 4 Xeloda 12/18/2022 CTs 01/02/2023-wall thickening of the mid gastric body-possibly progressive, unchanged peritoneal/omental caking, unchanged L1 compression fracture Cycle 5 Xeloda beginning 01/08/2023 Cycle 6 Xeloda beginning 01/29/2023 Cycle 7 Xeloda beginning 02/19/2023 Cycle 8 Xeloda beginning 03/12/2023 Cycle 9 Xeloda beginning 04/09/2023 Cycle 10 Xeloda beginning 05/07/2023 CTs 05/21/2023: Unchanged wall thickening of the gastric body, slight increase in small volume ascites, diffuse peritoneal thickening and stranding of the omentum, no evidence of metastatic disease to the chest Cycle 11 Xeloda beginning 05/28/2023 Cycle 12 Xeloda beginning 06/18/2023 Cycle 13 Xeloda beginning 07/09/2023 Cycle 14 Xeloda beginning 07/30/2023 CTA abdomen/pelvis 08/14/2023: Large volume  of heterogenous material in the gastric lumen-food/blood, small  volume ascites with diffuse peritoneal thickening and matting of the bowel/omentum, enhancement of the gastric body Truncal rash-status post recent punch biopsy with pathology pending; biopsy reported negative per family 04/26/2022 Lichen planus of the feet Hypothyroidism Atrial fibrillation Admission 07/24/2022 with acute bilateral pulmonary embolism/right heart strain Heparin anticoagulation 07/24/2022 Doppler 07/24/2022-acute DVT of the right femoral, popliteal, posterior tibial, and peroneal veins, acute left posterior tibial DVT Lovenox anticoagulation, changed to once daily dosing 05/25/2023 7.  Anemia secondary to chemotherapy, phlebotomy, and hemoptysis-improved 8.  L1 compression fracture on plain x-ray 12/18/2022 MRI lumbar spine 02/02/2023-acute/subacute L1 compression fracture, mild degenerative change of the lumbar spine 9.  Admission 08/14/2023 with hematemesis   Todd Harrison has metastatic gastric cancer.  He was diagnosed with a gastric mass and carcinomatosis in December 2023.  He has been maintained on systemic therapy since 05/04/2022 and there is been no evidence of disease progression.  He has not required a recurrent paracentesis since 04/24/2022.  Mr. Greenawalt is currently treated with capecitabine maintenance.  I reviewed the admission CT images.  There is no obvious evidence of disease progression, though the ascites appears slightly increased.  He is scheduled to resume capecitabine next week, we will hold capecitabine until he returns from a planned vacation to [place].  The hemoglobin is slightly lower today, but there is no apparent active bleeding. Recommendations: Monitor hemoglobin, transfuse packed red blood cells as needed for symptomatic anemia Hold anticoagulation Antiacid therapy Consider GI consult for a restaging endoscopy and to evaluate for persistent bleeding Please call oncology as needed, outpatient follow-up will be scheduled Cancer center for the week of  08/27/2023.  LOS: 3 days   Thornton Papas, MD   08/17/2023, 8:19 AM

## 2023-08-17 NOTE — Progress Notes (Signed)
 PROGRESS NOTE    Todd Harrison  ZOX:[id number] DOB: 1946/03/18 DOA: 08/14/2023 PCP: Burton Apley, MD   Brief Narrative: Todd Harrison is a 78 y.o. 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  Patient presented secondary to abdominal pain and hematemesis and found to have evidence of an upper GI bleed.  Admission was complicated by development of hypotension requiring ICU admission and vasopressor support.  Patient developed respiratory failure requiring intubation and mechanical ventilation.  GI and interventional radiology were consulted for management of GI bleeding with acute blood loss anemia with recommendations for no intervention.  Patient managed conservatively with blood transfusions as required.  Vasopressor support was weaned off and patient was successfully extubated and transferred out of the ICU on 4/10..   Assessment and Plan:  Upper GI bleed Likely secondary to known gastric cancer mass. Complicated by Lovenox use. Patient received protamine sulfate for reversal of Lovenox. GI was consulted but deferred upper endoscopy as this was thought to likely not be therapeutic. IR was consulted but did not find any targets to embolize. Patient managed conservatively with serial H&H, blood transfusions and octreotide.  Acute blood loss anemia Secondary to upper GI bleeding. Hemoglobin of 11.1 on admission. Patient received 2 units of PRBC on 4/8. Hemoglobin continues to drift downward. Hemoglobin of 8.9 today.  Hemorrhagic shock Patient required vasopressor support with Levophed, which was weaned off on 4/9. Patient also managed with blood transfusions and crystalloid fluid resuscitation. Resolved.  Metastatic gastric cancer Carcinomatosis Patient follows with medical oncology. Patient is currently maintained on systemic therapy and is currently on capecitabine with plan to restart as an outpatient. Medical oncology  recommendation for GI consult for consideration of upper endoscopy for restaging of gastric cancer. GI consulted and plan for upper GI endoscopy on 4/12.  Acute respiratory failure with hypoxia Patient required intubation on 4/8 and mechanical ventilation with ICU admission. Patient successfully extubated on 4/9  Paroxysmal atrial fibrillation Patient is managed Toprol-XL and Lovenox as an outpatient. Metoprolol held on admission secondary to GI bleeding and low-normal blood pressure. Lovenox also held secondary to GI bleeding.  Hypothyroidism Patient is on synthroid as an outpatient, which was not resumed on admission. -Resume home   History of DVT/PE Patient was previously on Lovenox, which is held secondary to GI bleeding.  Moderate malnutrition -Dietitian recommendation (4/9): Ensure Enlive po BID, each supplement provides 350 kcal and 20 grams of protein. Encourage PO intake as tolerated, encouraged once diet advanced to solids to consume foods pt enjoys. Pt aware of high protein/high calorie foods as he is followed by outpatient cancer center RD.    DVT prophylaxis: SCDs Code Status:   Code Status: Full Code Family Communication: Wife at bedside Disposition Plan: Discharge pending stable hemoglobin and GI management   Consultants:  Todd Harrison Medical oncology Gastroenterology Interventional radiology  Procedures:  None  Antimicrobials: None   Subjective: Patient reports no concerns this morning. Feeling stronger. Some back pain relieved with Tylenol.  Objective: BP 99/60 (BP Location: Left Arm)   Pulse 69   Temp (!) 97.3 F (36.3 C)   Resp 15   Ht 6' (1.829 m)   Wt 110.2 kg   SpO2 94%   BMI 32.95 kg/m   Examination:  General exam: Appears calm and comfortable Respiratory system: Clear to auscultation. Respiratory effort normal. Cardiovascular system: S1 & S2 heard, RRR. No murmurs, rubs, gallops or clicks. Gastrointestinal system: Abdomen is nondistended,  soft  and nontender. Normal bowel sounds heard. Central nervous system: Alert and oriented. No focal neurological deficits. Musculoskeletal: No edema. No calf tenderness Psychiatry: Judgement and insight appear normal. Mood & affect appropriate.    Data Reviewed: I have personally reviewed following labs and imaging studies  CBC Lab Results  Component Value Date   WBC 7.9 08/17/2023   RBC 2.77 (L) 08/17/2023   HGB 8.9 (L) 08/17/2023   HCT 26.3 (L) 08/17/2023   MCV 94.9 08/17/2023   MCH 32.1 08/17/2023   PLT 175 08/17/2023   MCHC 33.8 08/17/2023   RDW 19.5 (H) 08/17/2023   LYMPHSABS 1.1 08/17/2023   MONOABS 0.7 08/17/2023   EOSABS 0.2 08/17/2023   BASOSABS 0.1 08/17/2023     Last metabolic panel Lab Results  Component Value Date   NA 140 08/17/2023   K 3.8 08/17/2023   CL 108 08/17/2023   CO2 23 08/17/2023   BUN 16 08/17/2023   CREATININE 0.98 08/17/2023   GLUCOSE 99 08/17/2023   GFRNONAA >60 08/17/2023   GFRAA 74 05/20/2020   CALCIUM 8.0 (L) 08/17/2023   PHOS 3.7 08/15/2023   PROT 6.3 (L) 08/14/2023   ALBUMIN 3.0 (L) 08/14/2023   LABGLOB 1.7 10/22/2020   AGRATIO 2.5 (H) 10/22/2020   BILITOT 0.8 08/14/2023   ALKPHOS 54 08/14/2023   AST 15 08/14/2023   ALT 11 08/14/2023   ANIONGAP 9 08/17/2023    GFR: Estimated Creatinine Clearance: 83.5 mL/min (by C-G formula based on SCr of 0.98 mg/dL).  Recent Results (from the past 240 hours)  MRSA Next Gen by PCR, Nasal     Status: None   Collection Time: 08/14/23 11:25 PM   Specimen: Nasal Mucosa; Nasal Swab  Result Value Ref Range Status   MRSA by PCR Next Gen NOT DETECTED NOT DETECTED Final    Comment: (NOTE) The GeneXpert MRSA Assay (FDA approved for NASAL specimens only), is one component of a comprehensive MRSA colonization surveillance program. It is not intended to diagnose MRSA infection nor to guide or monitor treatment for MRSA infections. Test performance is not FDA approved in patients less than 76  years old. Performed at [facility], [address], [place], [place] [address]       Radiology Studies: No results found.    LOS: 3 days    Jacquelin Hawking, MD [facility] 08/17/2023, 12:05 PM   If 7PM-7AM, please contact night-coverage [email or web address]

## 2023-08-17 NOTE — Hospital Course (Addendum)
 Todd Harrison is a 78 y.o. 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  Patient presented secondary to abdominal pain and hematemesis and found to have evidence of an upper GI bleed.  Admission was complicated by development of hypotension requiring ICU admission and vasopressor support.  Patient developed respiratory failure requiring intubation and mechanical ventilation.  GI and interventional radiology were consulted for management of GI bleeding with acute blood loss anemia with recommendations for no intervention.  Patient managed conservatively with blood transfusions as required.  Vasopressor support was weaned off and patient was successfully extubated and transferred out of the ICU on 4/10. Upper endoscopy performed on 4/12 and revealed known gastric tumor with friable tissue and high risk evidence for rebleed on anticoagulation. Patient's hemoglobin remained stable prior to discharge. Lovenox discontinued secondary to risk for rebleed.

## 2023-08-17 NOTE — Care Management Important Message (Signed)
 Important Message  Patient Details  Name: Todd Harrison MRN: [id number] Date of Birth: October 12, 1945   Important Message Given:  Yes - Medicare IM     Dorena Bodo 08/17/2023, 4:29 PM

## 2023-08-18 ENCOUNTER — Encounter (HOSPITAL_COMMUNITY)
Admission: EM | Disposition: A | Discharge status: Home / Self Care | Payer: Self-pay | Source: Home / Self Care | Attending: Internal Medicine

## 2023-08-18 ENCOUNTER — Encounter (HOSPITAL_COMMUNITY): Payer: Self-pay | Admitting: Pulmonary Disease

## 2023-08-18 ENCOUNTER — Inpatient Hospital Stay (HOSPITAL_COMMUNITY): Admitting: Certified Registered"

## 2023-08-18 ENCOUNTER — Other Ambulatory Visit: Payer: Self-pay | Admitting: *Deleted

## 2023-08-18 DIAGNOSIS — D62 Acute posthemorrhagic anemia: Secondary | ICD-10-CM

## 2023-08-18 DIAGNOSIS — D490 Neoplasm of unspecified behavior of digestive system: Secondary | ICD-10-CM

## 2023-08-18 DIAGNOSIS — K259 Gastric ulcer, unspecified as acute or chronic, without hemorrhage or perforation: Secondary | ICD-10-CM | POA: Diagnosis not present

## 2023-08-18 DIAGNOSIS — C161 Malignant neoplasm of fundus of stomach: Secondary | ICD-10-CM | POA: Diagnosis not present

## 2023-08-18 DIAGNOSIS — K920 Hematemesis: Secondary | ICD-10-CM | POA: Diagnosis not present

## 2023-08-18 DIAGNOSIS — R578 Other shock: Secondary | ICD-10-CM | POA: Diagnosis not present

## 2023-08-18 DIAGNOSIS — E440 Moderate protein-calorie malnutrition: Secondary | ICD-10-CM | POA: Diagnosis not present

## 2023-08-18 DIAGNOSIS — C162 Malignant neoplasm of body of stomach: Secondary | ICD-10-CM | POA: Diagnosis not present

## 2023-08-18 DIAGNOSIS — C169 Malignant neoplasm of stomach, unspecified: Secondary | ICD-10-CM | POA: Diagnosis not present

## 2023-08-18 HISTORY — PX: ESOPHAGOGASTRODUODENOSCOPY: SHX5428

## 2023-08-18 LAB — CBC
HCT: 27.7 % — ABNORMAL LOW (ref 39.0–52.0)
Hemoglobin: 9.1 g/dL — ABNORMAL LOW (ref 13.0–17.0)
MCH: 31.2 pg (ref 26.0–34.0)
MCHC: 32.9 g/dL (ref 30.0–36.0)
MCV: 94.9 fL (ref 80.0–100.0)
Platelets: 188 10*3/uL (ref 150–400)
RBC: 2.92 MIL/uL — ABNORMAL LOW (ref 4.22–5.81)
RDW: 19.3 % — ABNORMAL HIGH (ref 11.5–15.5)
WBC: 7.5 10*3/uL (ref 4.0–10.5)
nRBC: 0 % (ref 0.0–0.2)

## 2023-08-18 SURGERY — EGD (ESOPHAGOGASTRODUODENOSCOPY)
Anesthesia: Monitor Anesthesia Care

## 2023-08-18 MED ORDER — HEPARIN SOD (PORK) LOCK FLUSH 100 UNIT/ML IV SOLN
500.0000 [IU] | INTRAVENOUS | Status: AC | PRN
  Administered 2023-08-18: 500 [IU]

## 2023-08-18 MED ORDER — PANTOPRAZOLE SODIUM 40 MG PO TBEC: 40.0000 mg | DELAYED_RELEASE_TABLET | Freq: Two times a day (BID) | ORAL | 0 refills | Status: DC

## 2023-08-18 MED ORDER — PHENYLEPHRINE 80 MCG/ML (10ML) SYRINGE FOR IV PUSH (FOR BLOOD PRESSURE SUPPORT)
PREFILLED_SYRINGE | INTRAVENOUS | Status: DC | PRN
  Administered 2023-08-18 (×3): 80 ug via INTRAVENOUS

## 2023-08-18 MED ORDER — PROPOFOL 500 MG/50ML IV EMUL
INTRAVENOUS | Status: DC | PRN
  Administered 2023-08-18: 100 ug/kg/min via INTRAVENOUS

## 2023-08-18 NOTE — Discharge Summary (Incomplete)
 Physician Discharge Summary   Patient: Todd Harrison MRN: [id number] DOB: 11-Oct-1945  Admit date:     08/14/2023  Discharge date: {dischdate:26783}  Discharge Physician: Aneita Keens   PCP: Dudley Ghee, MD   Recommendations at discharge:  {Tip this will not be part of the note when signed- Example include specific recommendations for outpatient follow-up, pending tests to follow-up on. (Optional):26781}  ***  Discharge Diagnoses: Principal Problem:   Hematemesis Active Problems:   Malignant neoplasm of stomach (HCC)   Malnutrition of moderate degree   Acute upper GI bleed   Hemorrhagic shock (HCC)   ABLA (acute blood loss anemia)  Resolved Problems:   * No resolved hospital problems. *  Hospital Course: Todd Harrison is a 78 y.o. 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  Patient presented secondary to abdominal pain and hematemesis and found to have evidence of an upper GI bleed.  Admission was complicated by development of hypotension requiring ICU admission and vasopressor support.  Patient developed respiratory failure requiring intubation and mechanical ventilation.  GI and interventional radiology were consulted for management of GI bleeding with acute blood loss anemia with recommendations for no intervention.  Patient managed conservatively with blood transfusions as required.  Vasopressor support was weaned off and patient was successfully extubated and transferred out of the ICU on 4/10..  Assessment and Plan: No notes have been filed under this hospital service. Service: Hospitalist     {Tip this will not be part of the note when signed Body mass index is 33.32 kg/m. ,  Nutrition Documentation    Flowsheet Row ED to Hosp-Admission (Current) from 08/14/2023 in [facility] 4E CV SURGICAL PROGRESSIVE CARE  Nutrition Problem Moderate Malnutrition  Etiology cancer and cancer related treatments, chronic illness   Nutrition Goal Patient will meet greater than or equal to 90% of their needs  Interventions Ensure Enlive (each supplement provides 350kcal and 20 grams of protein)     ,  (Optional):26781}  {(NOTE) Pain control PDMP Statment (Optional):26782} Consultants: *** Procedures performed: ***  Disposition: {Plan; Disposition:26390} Diet recommendation:  {Diet_Plan:26776} DISCHARGE MEDICATION: Allergies as of 08/18/2023       Reactions   Gold Itching   Mixed Grasses    unknown   Hydroxyquinolines Rash        Medication List     STOP taking these medications    enoxaparin 120 MG/0.8ML injection Commonly known as: LOVENOX   famotidine 40 MG tablet Commonly known as: PEPCID   methylPREDNISolone 4 MG Tbpk tablet Commonly known as: MEDROL DOSEPAK   metoprolol succinate 50 MG 24 hr tablet Commonly known as: TOPROL-XL   omeprazole 40 MG capsule Commonly known as: PRILOSEC       TAKE these medications    acetaminophen 500 MG tablet Commonly known as: TYLENOL Take 500 mg by mouth every 6 (six) hours as needed for mild pain (pain score 1-3).   acitretin 25 MG capsule Commonly known as: SORIATANE Take 25 mg by mouth daily.   albuterol 108 (90 Base) MCG/ACT inhaler Commonly known as: VENTOLIN HFA TAKE 2 PUFFS BY MOUTH EVERY 6 HOURS AS NEEDED FOR WHEEZE OR SHORTNESS OF BREATH   capecitabine 500 MG tablet Commonly known as: XELODA Take 3 tablets (1500 mg) by mouth in AM and 2 tablets (1000 mg) in PM. Take with food. Take for 14 days, then hold for 7 days. Repeat every 21 days. Start cycle on 07/30/23   clobetasol ointment 0.05 %  Commonly known as: TEMOVATE Apply 1 Application topically 2 (two) times daily.   fluticasone 50 MCG/ACT nasal spray Commonly known as: FLONASE Place 1 spray into both nostrils daily as needed for allergies or rhinitis.   fluticasone furoate-vilanterol 100-25 MCG/ACT Aepb Commonly known as: Breo Ellipta Inhale 1 puff into the lungs daily.    hydrOXYzine 25 MG tablet Commonly known as: ATARAX Take 25 mg by mouth 3 (three) times daily as needed for itching.   levothyroxine 112 MCG tablet Commonly known as: SYNTHROID Take 112 mcg by mouth daily before breakfast.   lidocaine-prilocaine cream Commonly known as: EMLA Apply 1 Application topically as needed (Apply 1 hour before coming to [facility] for accessing).   loratadine 10 MG tablet Commonly known as: CLARITIN Take 10 mg by mouth daily.   ondansetron 4 MG tablet Commonly known as: ZOFRAN Take 1 tablet (4 mg total) by mouth every 6 (six) hours. What changed:  when to take this reasons to take this Another medication with the same name was removed. Continue taking this medication, and follow the directions you see here.   oxyCODONE-acetaminophen 5-325 MG tablet Commonly known as: PERCOCET/ROXICET Take 1 tablet by mouth daily as needed for severe pain (Pain not relieved with tramadol).   pantoprazole 40 MG tablet Commonly known as: Protonix Take 1 tablet (40 mg total) by mouth 2 (two) times daily.   polyethylene glycol 17 g packet Commonly known as: MIRALAX / GLYCOLAX Take 17 g by mouth every evening.   potassium chloride 10 MEQ tablet Commonly known as: Klor-Con M10 Take 2 tablets (20 mEq total) by mouth daily.   prochlorperazine 10 MG tablet Commonly known as: COMPAZINE Take 1 tablet (10 mg total) by mouth every 6 (six) hours as needed for nausea or vomiting.   traMADol 50 MG tablet Commonly known as: ULTRAM Take 1 tablet (50 mg total) by mouth every 6 (six) hours as needed for moderate pain.   triamcinolone cream 0.1 % Commonly known as: KENALOG Apply 1 application  topically daily as needed (lichen planus flare).        Follow-up Information     Dudley Ghee, MD. Schedule an appointment as soon as possible for a visit in 1 week(s).   Specialty: Internal Medicine Why: For hospital follow-up Contact information: [address] [place] [address] [phone]         Sumner Ends, MD. Schedule an appointment as soon as possible for a visit.   Specialty: Oncology Why: For hospital follow-up Contact information: [address] [place] [address] [phone]                Discharge Exam: Filed Weights   08/16/23 0435 08/17/23 0426 08/18/23 0319  Weight: 113.9 kg 110.2 kg 111.4 kg   ***  Condition at discharge: {DC Condition:26389}  The results of significant diagnostics from this hospitalization (including imaging, microbiology, ancillary and laboratory) are listed below for reference.   Imaging Studies: DG Abd 1 View Result Date: 08/15/2023 CLINICAL DATA:  Orogastric tube placement EXAM: ABDOMEN - 1 VIEW COMPARISON:  None Available. FINDINGS: Nasogastric tube tip overlies the expected proximal body of the stomach. Nonobstructive bowel gas pattern. Excreted contrast within the nondilated proximal renal collecting systems bilaterally. IMPRESSION: 1. Nasogastric tube tip within the proximal body of the stomach. Electronically Signed   By: Worthy Heads M.D.   On: 08/15/2023 00:31   DG Chest Portable 1 View Result Date: 08/15/2023 CLINICAL DATA:  Respiratory failure EXAM:  PORTABLE CHEST 1 VIEW COMPARISON:  08/11/2023 FINDINGS: Endotracheal tube seen 4.7 cm above the carina. The retaining cuff of the tube appears hyperinflated and expands the airway at the thoracic inlet. Nasogastric tube extends into the upper abdomen beyond the margin of the examination. Pulmonary insufflation has slightly diminished since prior examination. Lungs are clear. No pneumothorax or pleural effusion. Stable mild cardiomegaly. Right internal jugular chest port tip is seen at the superior cavoatrial junction. Pulmonary vascularity is normal. No acute bone abnormality. IMPRESSION: 1. Endotracheal tube 4.7 cm above the carina. The retaining cuff of the tube appears hyperinflated and expands the airway at the  thoracic inlet. Revision may be appropriate. 2. Slightly diminished pulmonary insufflation. Electronically Signed   By: Worthy Heads M.D.   On: 08/15/2023 00:30   CT Angio Abd/Pel W and/or Wo Contrast Result Date: 08/14/2023 CLINICAL DATA:  Lower GI bleeding, hematemesis, history of gastric cancer * Tracking Code: BO * EXAM: CTA ABDOMEN AND PELVIS WITHOUT AND WITH CONTRAST TECHNIQUE: Multidetector CT imaging of the abdomen and pelvis was performed using the standard protocol during bolus administration of intravenous contrast. Multiplanar reconstructed images and MIPs were obtained and reviewed to evaluate the vascular anatomy. RADIATION DOSE REDUCTION: This exam was performed according to the departmental dose-optimization program which includes automated exposure control, adjustment of the mA and/or kV according to patient size and/or use of iterative reconstruction technique. CONTRAST:  OMNIPAQUE IOHEXOL 350 MG/ML SOLN COMPARISON:  CT chest abdomen pelvis, 05/21/2023 FINDINGS: VASCULAR Normal contour and caliber of the abdominal aorta. Moderate mixed calcific atherosclerosis. No evidence of aneurysm, dissection, or other acute aortic pathology. Standard branching pattern of the abdominal aorta with solitary bilateral renal arteries. Review of the MIP images confirms the above findings. NON-VASCULAR Lower Chest: Trace bilateral pleural effusions. Coronary artery calcifications. Hepatobiliary: No solid liver abnormality is seen. No gallstones, gallbladder wall thickening, or biliary dilatation. Pancreas: Unremarkable. No pancreatic ductal dilatation or surrounding inflammatory changes. Spleen: Normal in size without significant abnormality. Adrenals/Urinary Tract: Adrenal glands are unremarkable. Kidneys are normal, without renal calculi, solid lesion, or hydronephrosis. Bladder is unremarkable. Stomach/Bowel: Large volume of heterogeneous, hyperdense material in the gastric lumen, presumably admixed food  and blood product in the reported setting of hematemesis. Prominent enhancement of the gastric body, which is in keeping with known primary gastric malignancy and/or varices, without visualized extravasation into the bowel lumen (series 19, image 21). Appendix clearly visualized. No evidence of bowel wall thickening, distention, or inflammatory changes. Sigmoid diverticulosis. Lymphatic: No enlarged abdominal or pelvic lymph nodes. Reproductive: No mass or other significant abnormality. Other: No abdominal wall hernia. Injection granulomata throughout the superficial soft tissues of the low abdomen. Small volume ascites throughout the abdomen pelvis with diffuse peritoneal thickening as well as matting of the bowel and omentum, all findings similar to prior examination. Musculoskeletal: No acute osseous findings. IMPRESSION: 1. Large volume of heterogeneous, hyperdense material in the gastric lumen, presumably admixed food and blood product in the reported setting of hematemesis. 2. Prominent enhancement of the gastric body, which is in keeping with known primary gastric malignancy and/or varices, without visualized extravasation into the bowel lumen. 3. Small volume ascites throughout the abdomen pelvis with diffuse peritoneal thickening as well as matting of the bowel and omentum, all findings similar to prior examination and consistent with peritoneal carcinomatosis. 4. Descending and sigmoid diverticulosis without evidence of acute diverticulitis or diverticular hemorrhage. 5. Trace bilateral pleural effusions. 6. Coronary artery disease. Aortic Atherosclerosis (ICD10-I70.0). Electronically Signed  By: Fredricka Jenny M.D.   On: 08/14/2023 21:42   CT Head Wo Contrast Result Date: 08/11/2023 CLINICAL DATA:  Trauma EXAM: CT HEAD WITHOUT CONTRAST CT CERVICAL SPINE WITHOUT CONTRAST TECHNIQUE: Multidetector CT imaging of the head and cervical spine was performed following the standard protocol without intravenous  contrast. Multiplanar CT image reconstructions of the cervical spine were also generated. RADIATION DOSE REDUCTION: This exam was performed according to the departmental dose-optimization program which includes automated exposure control, adjustment of the mA and/or kV according to patient size and/or use of iterative reconstruction technique. COMPARISON:  None Available. FINDINGS: CT HEAD FINDINGS Brain: No mass,hemorrhage or extra-axial collection. Normal appearance of the parenchyma and CSF spaces. Vascular: No hyperdense vessel or unexpected vascular calcification. Skull: Right parietal scalp hematoma.  No skull fracture Sinuses/Orbits: No fluid levels or advanced mucosal thickening of the visualized paranasal sinuses. No mastoid or middle ear effusion. Normal orbits. Other: None. CT CERVICAL SPINE FINDINGS Alignment: No static subluxation. Facets are aligned. Occipital condyles are normally positioned. Skull base and vertebrae: No acute fracture. Soft tissues and spinal canal: No prevertebral fluid or swelling. No visible canal hematoma. Disc levels: Ossification of the posterior longitudinal ligament at C3-4 without spinal canal stenosis. Multilevel anterior vertebral body osteophytes. Upper chest: No pneumothorax, pulmonary nodule or pleural effusion. Other: Normal visualized paraspinal cervical soft tissues. IMPRESSION: 1. No acute intracranial abnormality. 2. Right parietal scalp hematoma without skull fracture. 3. No acute fracture or static subluxation of the cervical spine. Electronically Signed   By: Juanetta Nordmann M.D.   On: 08/11/2023 20:45   CT Cervical Spine Wo Contrast Result Date: 08/11/2023 CLINICAL DATA:  Trauma EXAM: CT HEAD WITHOUT CONTRAST CT CERVICAL SPINE WITHOUT CONTRAST TECHNIQUE: Multidetector CT imaging of the head and cervical spine was performed following the standard protocol without intravenous contrast. Multiplanar CT image reconstructions of the cervical spine were also  generated. RADIATION DOSE REDUCTION: This exam was performed according to the departmental dose-optimization program which includes automated exposure control, adjustment of the mA and/or kV according to patient size and/or use of iterative reconstruction technique. COMPARISON:  None Available. FINDINGS: CT HEAD FINDINGS Brain: No mass,hemorrhage or extra-axial collection. Normal appearance of the parenchyma and CSF spaces. Vascular: No hyperdense vessel or unexpected vascular calcification. Skull: Right parietal scalp hematoma.  No skull fracture Sinuses/Orbits: No fluid levels or advanced mucosal thickening of the visualized paranasal sinuses. No mastoid or middle ear effusion. Normal orbits. Other: None. CT CERVICAL SPINE FINDINGS Alignment: No static subluxation. Facets are aligned. Occipital condyles are normally positioned. Skull base and vertebrae: No acute fracture. Soft tissues and spinal canal: No prevertebral fluid or swelling. No visible canal hematoma. Disc levels: Ossification of the posterior longitudinal ligament at C3-4 without spinal canal stenosis. Multilevel anterior vertebral body osteophytes. Upper chest: No pneumothorax, pulmonary nodule or pleural effusion. Other: Normal visualized paraspinal cervical soft tissues. IMPRESSION: 1. No acute intracranial abnormality. 2. Right parietal scalp hematoma without skull fracture. 3. No acute fracture or static subluxation of the cervical spine. Electronically Signed   By: Juanetta Nordmann M.D.   On: 08/11/2023 20:45   DG Chest Port 1 View Result Date: 08/11/2023 CLINICAL DATA:  Status post fall. EXAM: PORTABLE CHEST 1 VIEW COMPARISON:  October 21, 2022 FINDINGS: There is stable right-sided venous Port-A-Cath positioning. The heart size and mediastinal contours are within normal limits. There is no evidence of an acute infiltrate, a transition or pneumothorax. The visualized skeletal structures are unremarkable. IMPRESSION: No active cardiopulmonary  disease. Electronically Signed   By: Virgle Grime M.D.   On: 08/11/2023 20:28    Microbiology: Results for orders placed or performed during the hospital encounter of 08/14/23  MRSA Next Gen by PCR, Nasal     Status: None   Collection Time: 08/14/23 11:25 PM   Specimen: Nasal Mucosa; Nasal Swab  Result Value Ref Range Status   MRSA by PCR Next Gen NOT DETECTED NOT DETECTED Final    Comment: (NOTE) The GeneXpert MRSA Assay (FDA approved for NASAL specimens only), is one component of a comprehensive MRSA colonization surveillance program. It is not intended to diagnose MRSA infection nor to guide or monitor treatment for MRSA infections. Test performance is not FDA approved in patients less than 21 years old. Performed at [facility], [address], [place], [place] [address]     Labs: CBC: Recent Labs  Lab 08/11/23 2003 08/14/23 1657 08/14/23 2340 08/15/23 0320 08/15/23 0411 08/15/23 1130 08/16/23 0431 08/17/23 1059 08/18/23 0530  WBC 8.5 12.9* 10.8*  --  9.7  --   --  7.9 7.5  NEUTROABS 5.5 10.3* 8.5*  --   --   --   --  5.8  --   HGB 11.9* 11.1* 10.5*   < > 10.8* 10.6* 9.6* 8.9* 9.1*  HCT 35.6* 33.7* 31.7*   < > 32.0* 31.4* 29.0* 26.3* 27.7*  MCV 95.7 98.0 94.6  --  92.2  --   --  94.9 94.9  PLT 203 253 217  --  230  --   --  175 188   < > = values in this interval not displayed.   Basic Metabolic Panel: Recent Labs  Lab 08/11/23 2003 08/14/23 1657 08/15/23 0320 08/15/23 0411 08/16/23 0714 08/17/23 0500  NA 135 135 138 137 135 140  K 3.9 4.6 4.4 4.3 3.5 3.8  CL 102 104  --  107 104 108  CO2 23 21*  --  21* 22 23  GLUCOSE 104* 139*  --  135* 121* 99  BUN 13 32*  --  31* 25* 16  CREATININE 1.21 1.24  --  1.12 1.01 0.98  CALCIUM 9.0 8.8*  --  7.6* 7.9* 8.0*  MG  --   --   --  1.8 2.1  --   PHOS  --   --   --  3.7  --   --    Liver Function Tests: Recent Labs  Lab 08/11/23 2003 08/14/23 1657  AST 19 15  ALT 12 11  ALKPHOS 55 54  BILITOT  0.8 0.8  PROT 5.9* 6.3*  ALBUMIN 3.1* 3.0*   CBG: No results for input(s): "GLUCAP" in the last 168 hours.  Discharge time spent: {LESS THAN/GREATER XBJY:78295} 30 minutes.  Signed: Aneita Keens, MD [facility] 08/18/2023

## 2023-08-18 NOTE — Discharge Instructions (Signed)
 Todd Harrison,  You were in the hospital with bleeding from your GI system. This was managed conservatively. Unfortunately, this was likely caused by your cancer and precipitated by Lovenox use; your Lovenox has been discontinued. Please follow-up with your PCP and Dr. Scherrie Curt. Please ensure to decreased prolonged immobility.

## 2023-08-18 NOTE — Transfer of Care (Signed)
 Immediate Anesthesia Transfer of Care Note  Patient: Todd Harrison  Procedure(s) Performed: EGD (ESOPHAGOGASTRODUODENOSCOPY)  Patient Location: PACU  Anesthesia Type:MAC  Level of Consciousness: sedated, drowsy, and patient cooperative  Airway & Oxygen Therapy: Patient Spontanous Breathing  Post-op Assessment: Report given to RN and Post -op Vital signs reviewed and stable  Post vital signs: Reviewed and stable  Last Vitals:  Vitals Value Taken Time  BP 100/67 08/18/23 0746  Temp    Pulse 65 08/18/23 0748  Resp 13 08/18/23 0748  SpO2 94 % 08/18/23 0748  Vitals shown include unfiled device data.  Last Pain:  Vitals:   08/18/23 0710  TempSrc: Temporal  PainSc: 0-No pain         Complications: No notable events documented.

## 2023-08-18 NOTE — Anesthesia Postprocedure Evaluation (Signed)
 Anesthesia Post Note  Patient: Todd Harrison  Procedure(s) Performed: EGD (ESOPHAGOGASTRODUODENOSCOPY)     Patient location during evaluation: PACU Anesthesia Type: MAC Level of consciousness: awake and alert Pain management: pain level controlled Vital Signs Assessment: post-procedure vital signs reviewed and stable Respiratory status: spontaneous breathing, nonlabored ventilation, respiratory function stable and patient connected to nasal cannula oxygen Cardiovascular status: stable and blood pressure returned to baseline Postop Assessment: no apparent nausea or vomiting Anesthetic complications: no   No notable events documented.  Last Vitals:  Vitals:   08/18/23 0818 08/18/23 1125  BP: 108/63 (!) 98/59  Pulse: 70   Resp: 16 (!) 21  Temp: 36.5 C 36.8 C  SpO2: 100% 96%    Last Pain:  Vitals:   08/18/23 1125  TempSrc: Axillary  PainSc: 2                  Theotis Flake P Braulio Kiedrowski

## 2023-08-18 NOTE — Progress Notes (Signed)
 IP PROGRESS NOTE  Subjective:   Todd Harrison is tolerating a diet.  No nausea/vomiting.  The stool remains black. Objective: Vital signs in last 24 hours: Blood pressure 108/63, pulse 70, temperature 97.7 F (36.5 C), temperature source Oral, resp. rate 16, height 6' (1.829 m), weight 245 lb 11.2 oz (111.4 kg), SpO2 100%.  Intake/Output from previous day: 04/11 0701 - 04/12 0700 In: 240 [P.O.:240] Out: 0   Physical Exam:  HEENT: No thrush or ulcers Lungs: Clear bilaterally Cardiac: Regular rate and rhythm Abdomen: Nondistended, soft, no mass, no hepatosplenomegaly Extremities: Trace edema at the right lower leg   Portacath/PICC-without erythema  Lab Results: Recent Labs    08/17/23 1059 08/18/23 0530  WBC 7.9 7.5  HGB 8.9* 9.1*  HCT 26.3* 27.7*  PLT 175 188    BMET Recent Labs    08/16/23 0714 08/17/23 0500  NA 135 140  K 3.5 3.8  CL 104 108  CO2 22 23  GLUCOSE 121* 99  BUN 25* 16  CREATININE 1.01 0.98  CALCIUM 7.9* 8.0*    Lab Results  Component Value Date   CEA1 <0.6 04/13/2022    Studies/Results: No results found.   Medications: I have reviewed the patient's current medications.  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  Internal hemorrhoids.  No specimens collected. Upper endoscopy 04/25/2022-large infiltrative mass with oozing bleeding and stigmata of recent bleeding in the gastric fundus, on the anterior wall of the gastric body and on the greater curvature of the gastric body; deformity  in the gastric antrum.  Extrinsic deformity in the entire duodenum.  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 Her2 by IHC negative, 1+; MMR IHC normal; PD-L1 CPS 1%, positive.  Cycle 1 FOLFOX 05/04/2022 Cycle 2 FOLFOX 05/17/2022 Cycle 3 FOLFOX 05/31/2022-oxaliplatin dose reduced secondary to prolonged cold sensitivity Cycle 4 FOLFOX 06/14/2022 Cycle 5 FOLFOX 06/28/2022 CT abdomen/pelvis 07/08/2022-asymmetric wall thickening at the lesser curvature of the stomach, decreased ascites, increased diffuse omental and peritoneal nodularity Cycle 6 FOLFOX 07/12/2022 Cycle 7 FOLFOX 08/09/2022 Cycle 8 FOLFOX 08/23/2022 Cycle 9 FOLFOX 09/06/2022 CTs 09/11/2022-stable, no new or progressive interval findings Cycle 10 FOLFOX 09/20/2022 Xeloda maintenance 2 weeks on/1 week off 10/16/2022 Cycle 2 Xeloda 11/06/2022-Xeloda dose reduced secondary to early foot skin toxicity Cycle 3 Xeloda 11/27/2022 Cycle 4 Xeloda 12/18/2022 CTs 01/02/2023-wall thickening of the mid gastric body-possibly progressive, unchanged peritoneal/omental caking, unchanged L1 compression fracture Cycle 5 Xeloda beginning 01/08/2023 Cycle 6 Xeloda beginning 01/29/2023 Cycle 7 Xeloda beginning 02/19/2023 Cycle 8 Xeloda beginning 03/12/2023 Cycle 9 Xeloda beginning 04/09/2023 Cycle 10 Xeloda beginning 05/07/2023 CTs 05/21/2023: Unchanged wall thickening of the gastric body, slight increase in small volume ascites, diffuse peritoneal thickening and stranding of the omentum, no evidence of metastatic disease to the chest Cycle 11 Xeloda beginning 05/28/2023 Cycle 12 Xeloda beginning 06/18/2023 Cycle 13 Xeloda beginning 07/09/2023 Cycle 14 Xeloda beginning 07/30/2023 CTA abdomen/pelvis 08/14/2023: Large volume of heterogenous material in the gastric lumen-food/blood, small volume ascites with diffuse peritoneal thickening and matting of the bowel/omentum, enhancement of the gastric body Truncal rash-status post recent punch  biopsy with pathology pending; biopsy reported negative per family 04/26/2022 Lichen planus of the  feet Hypothyroidism Atrial fibrillation Admission 07/24/2022 with acute bilateral pulmonary embolism/right heart strain Heparin anticoagulation 07/24/2022 Doppler 07/24/2022-acute DVT of the right femoral, popliteal, posterior tibial, and peroneal veins, acute left posterior tibial DVT Lovenox anticoagulation, changed to once daily dosing 05/25/2023 7.  Anemia secondary to chemotherapy, phlebotomy, and hemoptysis-improved 8.  L1 compression fracture on plain x-ray 12/18/2022 MRI lumbar spine 02/02/2023-acute/subacute L1 compression fracture, mild degenerative change of the lumbar spine 9.  Admission 08/14/2023 with hematemesis   Todd Harrison has metastatic gastric cancer.  He was diagnosed with a gastric mass and carcinomatosis in December 2023.  He has been maintained on systemic therapy since 05/04/2022 and there is been no evidence of disease progression.  He has not required a recurrent paracentesis since 04/24/2022.  Todd Harrison is currently treated with capecitabine maintenance.  I reviewed the admission CT images.  There is no obvious evidence of disease progression, though the ascites appears slightly increased.  He is scheduled to resume capecitabine next week, we will hold capecitabine until he returns from a planned vacation to [place] .  The hemoglobin is stable today.  The EGD results from this morning were communicated to me by Dr. Dominic Friendly.  I reviewed the EGD report.  There is friable tumor with ulcerations in the gastric fundus/body.  No active bleeding.  He remains at risk for recurrent bleeding with/without anticoagulation therapy.  He is also at risk for recurrent venous thromboembolic disease.  He had DVTs and pulmonary emboli in March 2024.  I recommend holding anticoagulation therapy for now.  We will consider resuming Lovenox at a prophylactic dose when he returns to the cancer center.  I  will discuss the risk/benefit of anticoagulation therapy with Todd Harrison.  Recommendations: He appears stable for discharge to home from an oncology standpoint Hold anticoagulation Antiacid therapy Outpatient follow-up will be scheduled at the Cancer center for the week of 08/27/2023 Support stockings, ambulate   LOS: 4 days   Coni Deep, MD   08/18/2023, 11:00 AM

## 2023-08-18 NOTE — Anesthesia Preprocedure Evaluation (Signed)
 Anesthesia Evaluation  Patient identified by MRN, date of birth, ID band Patient awake    Reviewed: Allergy & Precautions, NPO status , Patient's Chart, lab work & pertinent test results  Airway Mallampati: II  TM Distance: >3 FB Neck ROM: Full    Dental no notable dental hx.    Pulmonary asthma , sleep apnea , former smoker   Pulmonary exam normal        Cardiovascular hypertension, Pt. on medications and Pt. on home beta blockers + dysrhythmias Atrial Fibrillation  Rhythm:Irregular Rate:Normal     Neuro/Psych negative neurological ROS  negative psych ROS   GI/Hepatic Neg liver ROS,GERD  Medicated,,  Endo/Other  Hypothyroidism    Renal/GU negative Renal ROS  negative genitourinary   Musculoskeletal negative musculoskeletal ROS (+)    Abdominal Normal abdominal exam  (+)   Peds  Hematology  (+) Blood dyscrasia, anemia Lab Results      Component                Value               Date                      WBC                      7.5                 08/18/2023                HGB                      9.1 (L)             08/18/2023                HCT                      27.7 (L)            08/18/2023                MCV                      94.9                08/18/2023                PLT                      188                 08/18/2023              Anesthesia Other Findings   Reproductive/Obstetrics                             Anesthesia Physical Anesthesia Plan  ASA: 3  Anesthesia Plan: MAC   Post-op Pain Management:    Induction: Intravenous  PONV Risk Score and Plan: 1 and Propofol infusion and Treatment may vary due to age or medical condition  Airway Management Planned: Simple Face Mask and Nasal Cannula  Additional Equipment: None  Intra-op Plan:   Post-operative Plan:   Informed Consent: I have reviewed the patients History and Physical, chart, labs and discussed  the procedure including the risks, benefits and alternatives for  the proposed anesthesia with the patient or authorized representative who has indicated his/her understanding and acceptance.     Dental advisory given  Plan Discussed with: CRNA  Anesthesia Plan Comments:        Anesthesia Quick Evaluation

## 2023-08-18 NOTE — Interval H&P Note (Signed)
 History and Physical Interval Note:  08/18/2023 7:18 AM  Todd Harrison  has presented today for surgery, with the diagnosis of gastric cancer , recent bledding, restaging.  The various methods of treatment have been discussed with the patient and family. After consideration of risks, benefits and other options for treatment, the patient has consented to  Procedure(s): EGD (ESOPHAGOGASTRODUODENOSCOPY) (N/A) as a surgical intervention.  The patient's history has been reviewed, patient examined, no change in status, stable for surgery.  I have reviewed the patient's chart and labs.  Questions were answered to the patient's satisfaction.    Patient clinically stable and Hgb 9.1 today  [name]

## 2023-08-18 NOTE — Op Note (Signed)
 [facility] Patient Name: Todd Harrison Procedure Date : 08/18/2023 MRN: [id number] Attending MD: Roel Clarity. Dominic Friendly , MD, [id number] Date of Birth: 1945/06/03 CSN: [id number] Age: 78 Admit Type: Inpatient Procedure:                Upper GI endoscopy Indications:              Acute post hemorrhagic anemia, Hematemesis, Melena                           Metastatic gastric cancer, recurrent GI bleeding on                            Lovenox. See 08/17/23 inpatient consult note for                            details Providers:                Ace Abu L. Dominic Friendly, MD, Lorenzo Romberg, RN, Judith Novak, Technician Referring MD:             Dr. Anise Kerns and [facility] Medicines:                Monitored Anesthesia Care Complications:            No immediate complications. Estimated Blood Loss:     Estimated blood loss: none. Procedure:                Pre-Anesthesia Assessment:                           - Prior to the procedure, a History and Physical                            was performed, and patient medications and                            allergies were reviewed. The patient's tolerance of                            previous anesthesia was also reviewed. The risks                            and benefits of the procedure and the sedation                            options and risks were discussed with the patient.                            All questions were answered, and informed consent                            was obtained. Prior Anticoagulants: The patient has  taken no anticoagulant or antiplatelet agents. ASA                            Grade Assessment: IV - A patient with severe                            systemic disease that is a constant threat to life.                            After reviewing the risks and benefits, the patient                            was deemed in satisfactory condition to  undergo the                            procedure.                           After obtaining informed consent, the endoscope was                            passed under direct vision. Throughout the                            procedure, the patient's blood pressure, pulse, and                            oxygen saturations were monitored continuously. The                            [id number]-[id number] ([id number]) Olympus endoscope was introduced                            through the mouth, and advanced to the second part                            of duodenum. The upper GI endoscopy was                            accomplished without difficulty. The patient                            tolerated the procedure well. Scope In: Scope Out: Findings:      The esophagus was normal.      A large, infiltrative mass with no bleeding and no stigmata of recent       bleeding was found in the gastric fundus and in the gastric body. It       begins in the distal fundus and extends throughout the majority of the       gastric body, primarily along the greater curvature and the lower       anterior/posterior walls. Limits gastric insufflation. Tissue friability       with scope contact. Several clean-based serpiginous ulcers seen       overlying one area of this mass. Pylorus  well-visualized, traversed       without difficulty.      The examined duodenum was normal. Impression:               - Normal esophagus.                           - Gastric tumor in the gastric fundus and in the                            gastric body. Tissue friability and few clean-based                            serpiginous ulcers.                           - Normal examined duodenum.                           - No specimens collected.                           Unfortunately, there is no available endoscopic                            therapy to decrease the risk of recurrent bleeding. Recommendation:           - Return patient to  hospital ward for ongoing care.                           - Resume previous diet.                           - This patient is at high risk of rebleeding if                            anticoagulation is resumed.                           Will convey results to oncology, patient and family.                           Continue twice daily oral PPI.                           Oncology to discuss risk benefits of resuming                            anticoagulation for DVT versus IVC filter. Procedure Code(s):        --- Professional ---                           [phone], Esophagogastroduodenoscopy, flexible,                            transoral; diagnostic, including collection of  specimen(s) by brushing or washing, when performed                            (separate procedure) Diagnosis Code(s):        --- Professional ---                           D49.0, Neoplasm of unspecified behavior of                            digestive system                           D62, Acute posthemorrhagic anemia                           K92.0, Hematemesis                           K92.1, Melena (includes Hematochezia) CPT copyright 2022 [employer]. All rights reserved. The codes documented in this report are preliminary and upon coder review may  be revised to meet current compliance requirements. Eusevio Schriver L. Dominic Friendly, MD 08/18/2023 7:53:56 AM This report has been signed electronically. Number of Addenda: 0

## 2023-08-19 NOTE — Discharge Summary (Signed)
 Physician Discharge Summary   Patient: Todd Harrison MRN: [id number] DOB: January 23, 1946  Admit date:     08/14/2023  Discharge date: {dischdate:26783}  Discharge Physician: Aneita Keens   PCP: Dudley Ghee, MD   Recommendations at discharge:  {Tip this will not be part of the note when signed- Example include specific recommendations for outpatient follow-up, pending tests to follow-up on. (Optional):26781}  ***  Discharge Diagnoses: Principal Problem:   Hematemesis Active Problems:   Malignant neoplasm of stomach (HCC)   Malnutrition of moderate degree   Acute upper GI bleed   Hemorrhagic shock (HCC)   ABLA (acute blood loss anemia)  Resolved Problems:   * No resolved hospital problems. *  Hospital Course: MORSE BRUEGGEMANN is a 78 y.o. 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  Patient presented secondary to abdominal pain and hematemesis and found to have evidence of an upper GI bleed.  Admission was complicated by development of hypotension requiring ICU admission and vasopressor support.  Patient developed respiratory failure requiring intubation and mechanical ventilation.  GI and interventional radiology were consulted for management of GI bleeding with acute blood loss anemia with recommendations for no intervention.  Patient managed conservatively with blood transfusions as required.  Vasopressor support was weaned off and patient was successfully extubated and transferred out of the ICU on 4/10..  Assessment and Plan:  Upper GI bleed Likely secondary to known gastric cancer mass. Complicated by Lovenox use. Patient received protamine sulfate for reversal of Lovenox. GI was consulted but deferred upper endoscopy as this was thought to likely not be therapeutic. IR was consulted but did not find any targets to embolize. Patient managed conservatively with serial H&H, blood transfusions and octreotide.***   Acute blood  loss anemia Secondary to upper GI bleeding. Hemoglobin of 11.1 on admission. Patient received 2 units of PRBC on 4/8. Hemoglobin continues to drift downward. Hemoglobin of 8.9 today.   Hemorrhagic shock Patient required vasopressor support with Levophed, which was weaned off on 4/9. Patient also managed with blood transfusions and crystalloid fluid resuscitation. Resolved.   Metastatic gastric cancer Carcinomatosis Patient follows with medical oncology. Patient is currently maintained on systemic therapy and is currently on capecitabine with plan to restart as an outpatient. Medical oncology recommendation for GI consult for consideration of upper endoscopy for restaging of gastric cancer. GI consulted and plan for upper GI endoscopy on 4/12.   Acute respiratory failure with hypoxia Patient required intubation on 4/8 and mechanical ventilation with ICU admission. Patient successfully extubated on 4/9   Paroxysmal atrial fibrillation Patient is managed Toprol-XL and Lovenox as an outpatient. Metoprolol held on admission secondary to GI bleeding and low-normal blood pressure. Lovenox also held secondary to GI bleeding.   Hypothyroidism Patient is on synthroid as an outpatient, which was not resumed on admission. -Resume home    History of DVT/PE Patient was previously on Lovenox, which is held secondary to GI bleeding.   Moderate malnutrition -Dietitian recommendation (4/9): Ensure Enlive po BID, each supplement provides 350 kcal and 20 grams of protein. Encourage PO intake as tolerated, encouraged once diet advanced to solids to consume foods pt enjoys. Pt aware of high protein/high calorie foods as he is followed by outpatient cancer center RD.   {Tip this will not be part of the note when signed Body mass index is 33.32 kg/m. ,  Nutrition Documentation    Flowsheet Row ED to Hosp-Admission (Discharged) from 08/14/2023 in [facility] 4E  CV SURGICAL PROGRESSIVE CARE  Nutrition Problem  Moderate Malnutrition  Etiology cancer and cancer related treatments, chronic illness  Nutrition Goal Patient will meet greater than or equal to 90% of their needs  Interventions Ensure Enlive (each supplement provides 350kcal and 20 grams of protein)     ,  (Optional):26781}  {(NOTE) Pain control PDMP Statment (Optional):26782} Consultants: *** Procedures performed: ***  Disposition: {Plan; Disposition:26390} Diet recommendation:  {Diet_Plan:26776} DISCHARGE MEDICATION: Allergies as of 08/18/2023       Reactions   Gold Itching   Mixed Grasses    unknown   Hydroxyquinolines Rash        Medication List     STOP taking these medications    enoxaparin 120 MG/0.8ML injection Commonly known as: LOVENOX   famotidine 40 MG tablet Commonly known as: PEPCID   methylPREDNISolone 4 MG Tbpk tablet Commonly known as: MEDROL DOSEPAK   metoprolol succinate 50 MG 24 hr tablet Commonly known as: TOPROL-XL   omeprazole 40 MG capsule Commonly known as: PRILOSEC       TAKE these medications    acetaminophen 500 MG tablet Commonly known as: TYLENOL Take 500 mg by mouth every 6 (six) hours as needed for mild pain (pain score 1-3).   acitretin 25 MG capsule Commonly known as: SORIATANE Take 25 mg by mouth daily.   albuterol 108 (90 Base) MCG/ACT inhaler Commonly known as: VENTOLIN HFA TAKE 2 PUFFS BY MOUTH EVERY 6 HOURS AS NEEDED FOR WHEEZE OR SHORTNESS OF BREATH   capecitabine 500 MG tablet Commonly known as: XELODA Take 3 tablets (1500 mg) by mouth in AM and 2 tablets (1000 mg) in PM. Take with food. Take for 14 days, then hold for 7 days. Repeat every 21 days. Start cycle on 07/30/23   clobetasol ointment 0.05 % Commonly known as: TEMOVATE Apply 1 Application topically 2 (two) times daily.   fluticasone 50 MCG/ACT nasal spray Commonly known as: FLONASE Place 1 spray into both nostrils daily as needed for allergies or rhinitis.   fluticasone furoate-vilanterol  100-25 MCG/ACT Aepb Commonly known as: Breo Ellipta Inhale 1 puff into the lungs daily.   hydrOXYzine 25 MG tablet Commonly known as: ATARAX Take 25 mg by mouth 3 (three) times daily as needed for itching.   levothyroxine 112 MCG tablet Commonly known as: SYNTHROID Take 112 mcg by mouth daily before breakfast.   lidocaine-prilocaine cream Commonly known as: EMLA Apply 1 Application topically as needed (Apply 1 hour before coming to Cancer Center for accessing).   loratadine 10 MG tablet Commonly known as: CLARITIN Take 10 mg by mouth daily.   ondansetron 4 MG tablet Commonly known as: ZOFRAN Take 1 tablet (4 mg total) by mouth every 6 (six) hours. What changed:  when to take this reasons to take this Another medication with the same name was removed. Continue taking this medication, and follow the directions you see here.   oxyCODONE-acetaminophen 5-325 MG tablet Commonly known as: PERCOCET/ROXICET Take 1 tablet by mouth daily as needed for severe pain (Pain not relieved with tramadol).   pantoprazole 40 MG tablet Commonly known as: Protonix Take 1 tablet (40 mg total) by mouth 2 (two) times daily.   polyethylene glycol 17 g packet Commonly known as: MIRALAX / GLYCOLAX Take 17 g by mouth every evening.   potassium chloride 10 MEQ tablet Commonly known as: Klor-Con M10 Take 2 tablets (20 mEq total) by mouth daily.   prochlorperazine 10 MG tablet Commonly known as: COMPAZINE Take 1  tablet (10 mg total) by mouth every 6 (six) hours as needed for nausea or vomiting.   traMADol 50 MG tablet Commonly known as: ULTRAM Take 1 tablet (50 mg total) by mouth every 6 (six) hours as needed for moderate pain.   triamcinolone cream 0.1 % Commonly known as: KENALOG Apply 1 application  topically daily as needed (lichen planus flare).        Follow-up Information     Dudley Ghee, MD. Schedule an appointment as soon as possible for a visit in 1 week(s).   Specialty:  Internal Medicine Why: For hospital follow-up Contact information: [address] [place] [address] [phone]         Sumner Ends, MD. Schedule an appointment as soon as possible for a visit.   Specialty: Oncology Why: For hospital follow-up Contact information: [address] [place] [facility] [place] [address] [phone]                Discharge Exam: Filed Weights   08/16/23 0435 08/17/23 0426 08/18/23 0319  Weight: 113.9 kg 110.2 kg 111.4 kg   ***  Condition at discharge: {DC Condition:26389}  The results of significant diagnostics from this hospitalization (including imaging, microbiology, ancillary and laboratory) are listed below for reference.   Imaging Studies: DG Abd 1 View Result Date: 08/15/2023 CLINICAL DATA:  Orogastric tube placement EXAM: ABDOMEN - 1 VIEW COMPARISON:  None Available. FINDINGS: Nasogastric tube tip overlies the expected proximal body of the stomach. Nonobstructive bowel gas pattern. Excreted contrast within the nondilated proximal renal collecting systems bilaterally. IMPRESSION: 1. Nasogastric tube tip within the proximal body of the stomach. Electronically Signed   By: Worthy Heads M.D.   On: 08/15/2023 00:31   DG Chest Portable 1 View Result Date: 08/15/2023 CLINICAL DATA:  Respiratory failure EXAM: PORTABLE CHEST 1 VIEW COMPARISON:  08/11/2023 FINDINGS: Endotracheal tube seen 4.7 cm above the carina. The retaining cuff of the tube appears hyperinflated and expands the airway at the thoracic inlet. Nasogastric tube extends into the upper abdomen beyond the margin of the examination. Pulmonary insufflation has slightly diminished since prior examination. Lungs are clear. No pneumothorax or pleural effusion. Stable mild cardiomegaly. Right internal jugular chest port tip is seen at the superior cavoatrial junction. Pulmonary vascularity is normal. No acute bone abnormality. IMPRESSION: 1. Endotracheal tube 4.7 cm above the carina.  The retaining cuff of the tube appears hyperinflated and expands the airway at the thoracic inlet. Revision may be appropriate. 2. Slightly diminished pulmonary insufflation. Electronically Signed   By: Worthy Heads M.D.   On: 08/15/2023 00:30   CT Angio Abd/Pel W and/or Wo Contrast Result Date: 08/14/2023 CLINICAL DATA:  Lower GI bleeding, hematemesis, history of gastric cancer * Tracking Code: BO * EXAM: CTA ABDOMEN AND PELVIS WITHOUT AND WITH CONTRAST TECHNIQUE: Multidetector CT imaging of the abdomen and pelvis was performed using the standard protocol during bolus administration of intravenous contrast. Multiplanar reconstructed images and MIPs were obtained and reviewed to evaluate the vascular anatomy. RADIATION DOSE REDUCTION: This exam was performed according to the departmental dose-optimization program which includes automated exposure control, adjustment of the mA and/or kV according to patient size and/or use of iterative reconstruction technique. CONTRAST:  OMNIPAQUE IOHEXOL 350 MG/ML SOLN COMPARISON:  CT chest abdomen pelvis, 05/21/2023 FINDINGS: VASCULAR Normal contour and caliber of the abdominal aorta. Moderate mixed calcific atherosclerosis. No evidence of aneurysm, dissection, or other acute aortic pathology. Standard branching pattern of the abdominal aorta with solitary bilateral renal  arteries. Review of the MIP images confirms the above findings. NON-VASCULAR Lower Chest: Trace bilateral pleural effusions. Coronary artery calcifications. Hepatobiliary: No solid liver abnormality is seen. No gallstones, gallbladder wall thickening, or biliary dilatation. Pancreas: Unremarkable. No pancreatic ductal dilatation or surrounding inflammatory changes. Spleen: Normal in size without significant abnormality. Adrenals/Urinary Tract: Adrenal glands are unremarkable. Kidneys are normal, without renal calculi, solid lesion, or hydronephrosis. Bladder is unremarkable. Stomach/Bowel: Large volume  of heterogeneous, hyperdense material in the gastric lumen, presumably admixed food and blood product in the reported setting of hematemesis. Prominent enhancement of the gastric body, which is in keeping with known primary gastric malignancy and/or varices, without visualized extravasation into the bowel lumen (series 19, image 21). Appendix clearly visualized. No evidence of bowel wall thickening, distention, or inflammatory changes. Sigmoid diverticulosis. Lymphatic: No enlarged abdominal or pelvic lymph nodes. Reproductive: No mass or other significant abnormality. Other: No abdominal wall hernia. Injection granulomata throughout the superficial soft tissues of the low abdomen. Small volume ascites throughout the abdomen pelvis with diffuse peritoneal thickening as well as matting of the bowel and omentum, all findings similar to prior examination. Musculoskeletal: No acute osseous findings. IMPRESSION: 1. Large volume of heterogeneous, hyperdense material in the gastric lumen, presumably admixed food and blood product in the reported setting of hematemesis. 2. Prominent enhancement of the gastric body, which is in keeping with known primary gastric malignancy and/or varices, without visualized extravasation into the bowel lumen. 3. Small volume ascites throughout the abdomen pelvis with diffuse peritoneal thickening as well as matting of the bowel and omentum, all findings similar to prior examination and consistent with peritoneal carcinomatosis. 4. Descending and sigmoid diverticulosis without evidence of acute diverticulitis or diverticular hemorrhage. 5. Trace bilateral pleural effusions. 6. Coronary artery disease. Aortic Atherosclerosis (ICD10-I70.0). Electronically Signed   By: Fredricka Jenny M.D.   On: 08/14/2023 21:42   CT Head Wo Contrast Result Date: 08/11/2023 CLINICAL DATA:  Trauma EXAM: CT HEAD WITHOUT CONTRAST CT CERVICAL SPINE WITHOUT CONTRAST TECHNIQUE: Multidetector CT imaging of the head and  cervical spine was performed following the standard protocol without intravenous contrast. Multiplanar CT image reconstructions of the cervical spine were also generated. RADIATION DOSE REDUCTION: This exam was performed according to the departmental dose-optimization program which includes automated exposure control, adjustment of the mA and/or kV according to patient size and/or use of iterative reconstruction technique. COMPARISON:  None Available. FINDINGS: CT HEAD FINDINGS Brain: No mass,hemorrhage or extra-axial collection. Normal appearance of the parenchyma and CSF spaces. Vascular: No hyperdense vessel or unexpected vascular calcification. Skull: Right parietal scalp hematoma.  No skull fracture Sinuses/Orbits: No fluid levels or advanced mucosal thickening of the visualized paranasal sinuses. No mastoid or middle ear effusion. Normal orbits. Other: None. CT CERVICAL SPINE FINDINGS Alignment: No static subluxation. Facets are aligned. Occipital condyles are normally positioned. Skull base and vertebrae: No acute fracture. Soft tissues and spinal canal: No prevertebral fluid or swelling. No visible canal hematoma. Disc levels: Ossification of the posterior longitudinal ligament at C3-4 without spinal canal stenosis. Multilevel anterior vertebral body osteophytes. Upper chest: No pneumothorax, pulmonary nodule or pleural effusion. Other: Normal visualized paraspinal cervical soft tissues. IMPRESSION: 1. No acute intracranial abnormality. 2. Right parietal scalp hematoma without skull fracture. 3. No acute fracture or static subluxation of the cervical spine. Electronically Signed   By: Juanetta Nordmann M.D.   On: 08/11/2023 20:45   CT Cervical Spine Wo Contrast Result Date: 08/11/2023 CLINICAL DATA:  Trauma EXAM: CT HEAD WITHOUT CONTRAST  CT CERVICAL SPINE WITHOUT CONTRAST TECHNIQUE: Multidetector CT imaging of the head and cervical spine was performed following the standard protocol without intravenous  contrast. Multiplanar CT image reconstructions of the cervical spine were also generated. RADIATION DOSE REDUCTION: This exam was performed according to the departmental dose-optimization program which includes automated exposure control, adjustment of the mA and/or kV according to patient size and/or use of iterative reconstruction technique. COMPARISON:  None Available. FINDINGS: CT HEAD FINDINGS Brain: No mass,hemorrhage or extra-axial collection. Normal appearance of the parenchyma and CSF spaces. Vascular: No hyperdense vessel or unexpected vascular calcification. Skull: Right parietal scalp hematoma.  No skull fracture Sinuses/Orbits: No fluid levels or advanced mucosal thickening of the visualized paranasal sinuses. No mastoid or middle ear effusion. Normal orbits. Other: None. CT CERVICAL SPINE FINDINGS Alignment: No static subluxation. Facets are aligned. Occipital condyles are normally positioned. Skull base and vertebrae: No acute fracture. Soft tissues and spinal canal: No prevertebral fluid or swelling. No visible canal hematoma. Disc levels: Ossification of the posterior longitudinal ligament at C3-4 without spinal canal stenosis. Multilevel anterior vertebral body osteophytes. Upper chest: No pneumothorax, pulmonary nodule or pleural effusion. Other: Normal visualized paraspinal cervical soft tissues. IMPRESSION: 1. No acute intracranial abnormality. 2. Right parietal scalp hematoma without skull fracture. 3. No acute fracture or static subluxation of the cervical spine. Electronically Signed   By: Juanetta Nordmann M.D.   On: 08/11/2023 20:45   DG Chest Port 1 View Result Date: 08/11/2023 CLINICAL DATA:  Status post fall. EXAM: PORTABLE CHEST 1 VIEW COMPARISON:  October 21, 2022 FINDINGS: There is stable right-sided venous Port-A-Cath positioning. The heart size and mediastinal contours are within normal limits. There is no evidence of an acute infiltrate, a transition or pneumothorax. The visualized  skeletal structures are unremarkable. IMPRESSION: No active cardiopulmonary disease. Electronically Signed   By: Virgle Grime M.D.   On: 08/11/2023 20:28    Microbiology: Results for orders placed or performed during the hospital encounter of 08/14/23  MRSA Next Gen by PCR, Nasal     Status: None   Collection Time: 08/14/23 11:25 PM   Specimen: Nasal Mucosa; Nasal Swab  Result Value Ref Range Status   MRSA by PCR Next Gen NOT DETECTED NOT DETECTED Final    Comment: (NOTE) The GeneXpert MRSA Assay (FDA approved for NASAL specimens only), is one component of a comprehensive MRSA colonization surveillance program. It is not intended to diagnose MRSA infection nor to guide or monitor treatment for MRSA infections. Test performance is not FDA approved in patients less than 40 years old. Performed at [facility], [address], [place], [place] [address]     Labs: CBC: Recent Labs  Lab 08/14/23 1657 08/14/23 2340 08/15/23 0320 08/15/23 0411 08/15/23 1130 08/16/23 0431 08/17/23 1059 08/18/23 0530  WBC 12.9* 10.8*  --  9.7  --   --  7.9 7.5  NEUTROABS 10.3* 8.5*  --   --   --   --  5.8  --   HGB 11.1* 10.5*   < > 10.8* 10.6* 9.6* 8.9* 9.1*  HCT 33.7* 31.7*   < > 32.0* 31.4* 29.0* 26.3* 27.7*  MCV 98.0 94.6  --  92.2  --   --  94.9 94.9  PLT 253 217  --  230  --   --  175 188   < > = values in this interval not displayed.   Basic Metabolic Panel: Recent Labs  Lab 08/14/23 1657 08/15/23 0320 08/15/23 0411  08/16/23 0714 08/17/23 0500  NA 135 138 137 135 140  K 4.6 4.4 4.3 3.5 3.8  CL 104  --  107 104 108  CO2 21*  --  21* 22 23  GLUCOSE 139*  --  135* 121* 99  BUN 32*  --  31* 25* 16  CREATININE 1.24  --  1.12 1.01 0.98  CALCIUM 8.8*  --  7.6* 7.9* 8.0*  MG  --   --  1.8 2.1  --   PHOS  --   --  3.7  --   --    Liver Function Tests: Recent Labs  Lab 08/14/23 1657  AST 15  ALT 11  ALKPHOS 54  BILITOT 0.8  PROT 6.3*  ALBUMIN 3.0*   CBG: No  results for input(s): "GLUCAP" in the last 168 hours.  Discharge time spent: {LESS THAN/GREATER ZOXW:96045} 30 minutes.  Signed: Aneita Keens, MD [facility] 08/19/2023

## 2023-08-19 NOTE — Discharge Summary (Incomplete)
 Physician Discharge Summary   Patient: Todd Harrison MRN: [id number] DOB: [birth year]  Admit date:     08/14/2023  Discharge date: {dischdate:26783}  Discharge Physician: Aneita Keens   PCP: Dudley Ghee, MD   Recommendations at discharge:  {Tip this will not be part of the note when signed- Example include specific recommendations for outpatient follow-up, pending tests to follow-up on. (Optional):26781}  ***  Discharge Diagnoses: Principal Problem:   Hematemesis Active Problems:   Malignant neoplasm of stomach (HCC)   Malnutrition of moderate degree   Acute upper GI bleed   Hemorrhagic shock (HCC)   ABLA (acute blood loss anemia)  Resolved Problems:   * No resolved hospital problems. *  Hospital Course: Todd Harrison is a 78 y.o. 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  Patient presented secondary to abdominal pain and hematemesis and found to have evidence of an upper GI bleed.  Admission was complicated by development of hypotension requiring ICU admission and vasopressor support.  Patient developed respiratory failure requiring intubation and mechanical ventilation.  GI and interventional radiology were consulted for management of GI bleeding with acute blood loss anemia with recommendations for no intervention.  Patient managed conservatively with blood transfusions as required.  Vasopressor support was weaned off and patient was successfully extubated and transferred out of the ICU on 4/10..  Assessment and Plan: No notes have been filed under this hospital service. Service: Hospitalist     {Tip this will not be part of the note when signed Body mass index is 33.32 kg/m. ,  Nutrition Documentation    Flowsheet Row ED to Hosp-Admission (Discharged) from 08/14/2023 in [facility] 4E CV SURGICAL PROGRESSIVE CARE  Nutrition Problem Moderate Malnutrition  Etiology cancer and cancer related treatments, chronic illness   Nutrition Goal Patient will meet greater than or equal to 90% of their needs  Interventions Ensure Enlive (each supplement provides 350kcal and 20 grams of protein)     ,  (Optional):26781}  {(NOTE) Pain control PDMP Statment (Optional):26782} Consultants: *** Procedures performed: ***  Disposition: {Plan; Disposition:26390} Diet recommendation:  {Diet_Plan:26776} DISCHARGE MEDICATION: Allergies as of 08/18/2023       Reactions   Gold Itching   Mixed Grasses    unknown   Hydroxyquinolines Rash        Medication List     STOP taking these medications    enoxaparin 120 MG/0.8ML injection Commonly known as: LOVENOX   famotidine 40 MG tablet Commonly known as: PEPCID   methylPREDNISolone 4 MG Tbpk tablet Commonly known as: MEDROL DOSEPAK   metoprolol succinate 50 MG 24 hr tablet Commonly known as: TOPROL-XL   omeprazole 40 MG capsule Commonly known as: PRILOSEC       TAKE these medications    acetaminophen 500 MG tablet Commonly known as: TYLENOL Take 500 mg by mouth every 6 (six) hours as needed for mild pain (pain score 1-3).   acitretin 25 MG capsule Commonly known as: SORIATANE Take 25 mg by mouth daily.   albuterol 108 (90 Base) MCG/ACT inhaler Commonly known as: VENTOLIN HFA TAKE 2 PUFFS BY MOUTH EVERY 6 HOURS AS NEEDED FOR WHEEZE OR SHORTNESS OF BREATH   capecitabine 500 MG tablet Commonly known as: XELODA Take 3 tablets (1500 mg) by mouth in AM and 2 tablets (1000 mg) in PM. Take with food. Take for 14 days, then hold for 7 days. Repeat every 21 days. Start cycle on 07/30/23   clobetasol ointment 0.05 %  Commonly known as: TEMOVATE Apply 1 Application topically 2 (two) times daily.   fluticasone 50 MCG/ACT nasal spray Commonly known as: FLONASE Place 1 spray into both nostrils daily as needed for allergies or rhinitis.   fluticasone furoate-vilanterol 100-25 MCG/ACT Aepb Commonly known as: Breo Ellipta Inhale 1 puff into the lungs daily.    hydrOXYzine 25 MG tablet Commonly known as: ATARAX Take 25 mg by mouth 3 (three) times daily as needed for itching.   levothyroxine 112 MCG tablet Commonly known as: SYNTHROID Take 112 mcg by mouth daily before breakfast.   lidocaine-prilocaine cream Commonly known as: EMLA Apply 1 Application topically as needed (Apply 1 hour before coming to [facility] for accessing).   loratadine 10 MG tablet Commonly known as: CLARITIN Take 10 mg by mouth daily.   ondansetron 4 MG tablet Commonly known as: ZOFRAN Take 1 tablet (4 mg total) by mouth every 6 (six) hours. What changed:  when to take this reasons to take this Another medication with the same name was removed. Continue taking this medication, and follow the directions you see here.   oxyCODONE-acetaminophen 5-325 MG tablet Commonly known as: PERCOCET/ROXICET Take 1 tablet by mouth daily as needed for severe pain (Pain not relieved with tramadol).   pantoprazole 40 MG tablet Commonly known as: Protonix Take 1 tablet (40 mg total) by mouth 2 (two) times daily.   polyethylene glycol 17 g packet Commonly known as: MIRALAX / GLYCOLAX Take 17 g by mouth every evening.   potassium chloride 10 MEQ tablet Commonly known as: Klor-Con M10 Take 2 tablets (20 mEq total) by mouth daily.   prochlorperazine 10 MG tablet Commonly known as: COMPAZINE Take 1 tablet (10 mg total) by mouth every 6 (six) hours as needed for nausea or vomiting.   traMADol 50 MG tablet Commonly known as: ULTRAM Take 1 tablet (50 mg total) by mouth every 6 (six) hours as needed for moderate pain.   triamcinolone cream 0.1 % Commonly known as: KENALOG Apply 1 application  topically daily as needed (lichen planus flare).        Follow-up Information     Dudley Ghee, MD. Schedule an appointment as soon as possible for a visit in 1 week(s).   Specialty: Internal Medicine Why: For hospital follow-up Contact information: [address] [place] [address] [phone]         Sumner Ends, MD. Schedule an appointment as soon as possible for a visit.   Specialty: Oncology Why: For hospital follow-up Contact information: [address] [place] [address] [phone]                Discharge Exam: Filed Weights   08/16/23 0435 08/17/23 0426 08/18/23 0319  Weight: 113.9 kg 110.2 kg 111.4 kg   ***  Condition at discharge: {DC Condition:26389}  The results of significant diagnostics from this hospitalization (including imaging, microbiology, ancillary and laboratory) are listed below for reference.   Imaging Studies: DG Abd 1 View Result Date: 08/15/2023 CLINICAL DATA:  Orogastric tube placement EXAM: ABDOMEN - 1 VIEW COMPARISON:  None Available. FINDINGS: Nasogastric tube tip overlies the expected proximal body of the stomach. Nonobstructive bowel gas pattern. Excreted contrast within the nondilated proximal renal collecting systems bilaterally. IMPRESSION: 1. Nasogastric tube tip within the proximal body of the stomach. Electronically Signed   By: Worthy Heads M.D.   On: 08/15/2023 00:31   DG Chest Portable 1 View Result Date: 08/15/2023 CLINICAL DATA:  Respiratory failure EXAM:  PORTABLE CHEST 1 VIEW COMPARISON:  08/11/2023 FINDINGS: Endotracheal tube seen 4.7 cm above the carina. The retaining cuff of the tube appears hyperinflated and expands the airway at the thoracic inlet. Nasogastric tube extends into the upper abdomen beyond the margin of the examination. Pulmonary insufflation has slightly diminished since prior examination. Lungs are clear. No pneumothorax or pleural effusion. Stable mild cardiomegaly. Right internal jugular chest port tip is seen at the superior cavoatrial junction. Pulmonary vascularity is normal. No acute bone abnormality. IMPRESSION: 1. Endotracheal tube 4.7 cm above the carina. The retaining cuff of the tube appears hyperinflated and expands the airway at the  thoracic inlet. Revision may be appropriate. 2. Slightly diminished pulmonary insufflation. Electronically Signed   By: Worthy Heads M.D.   On: 08/15/2023 00:30   CT Angio Abd/Pel W and/or Wo Contrast Result Date: 08/14/2023 CLINICAL DATA:  Lower GI bleeding, hematemesis, history of gastric cancer * Tracking Code: BO * EXAM: CTA ABDOMEN AND PELVIS WITHOUT AND WITH CONTRAST TECHNIQUE: Multidetector CT imaging of the abdomen and pelvis was performed using the standard protocol during bolus administration of intravenous contrast. Multiplanar reconstructed images and MIPs were obtained and reviewed to evaluate the vascular anatomy. RADIATION DOSE REDUCTION: This exam was performed according to the departmental dose-optimization program which includes automated exposure control, adjustment of the mA and/or kV according to patient size and/or use of iterative reconstruction technique. CONTRAST:  OMNIPAQUE IOHEXOL 350 MG/ML SOLN COMPARISON:  CT chest abdomen pelvis, 05/21/2023 FINDINGS: VASCULAR Normal contour and caliber of the abdominal aorta. Moderate mixed calcific atherosclerosis. No evidence of aneurysm, dissection, or other acute aortic pathology. Standard branching pattern of the abdominal aorta with solitary bilateral renal arteries. Review of the MIP images confirms the above findings. NON-VASCULAR Lower Chest: Trace bilateral pleural effusions. Coronary artery calcifications. Hepatobiliary: No solid liver abnormality is seen. No gallstones, gallbladder wall thickening, or biliary dilatation. Pancreas: Unremarkable. No pancreatic ductal dilatation or surrounding inflammatory changes. Spleen: Normal in size without significant abnormality. Adrenals/Urinary Tract: Adrenal glands are unremarkable. Kidneys are normal, without renal calculi, solid lesion, or hydronephrosis. Bladder is unremarkable. Stomach/Bowel: Large volume of heterogeneous, hyperdense material in the gastric lumen, presumably admixed food  and blood product in the reported setting of hematemesis. Prominent enhancement of the gastric body, which is in keeping with known primary gastric malignancy and/or varices, without visualized extravasation into the bowel lumen (series 19, image 21). Appendix clearly visualized. No evidence of bowel wall thickening, distention, or inflammatory changes. Sigmoid diverticulosis. Lymphatic: No enlarged abdominal or pelvic lymph nodes. Reproductive: No mass or other significant abnormality. Other: No abdominal wall hernia. Injection granulomata throughout the superficial soft tissues of the low abdomen. Small volume ascites throughout the abdomen pelvis with diffuse peritoneal thickening as well as matting of the bowel and omentum, all findings similar to prior examination. Musculoskeletal: No acute osseous findings. IMPRESSION: 1. Large volume of heterogeneous, hyperdense material in the gastric lumen, presumably admixed food and blood product in the reported setting of hematemesis. 2. Prominent enhancement of the gastric body, which is in keeping with known primary gastric malignancy and/or varices, without visualized extravasation into the bowel lumen. 3. Small volume ascites throughout the abdomen pelvis with diffuse peritoneal thickening as well as matting of the bowel and omentum, all findings similar to prior examination and consistent with peritoneal carcinomatosis. 4. Descending and sigmoid diverticulosis without evidence of acute diverticulitis or diverticular hemorrhage. 5. Trace bilateral pleural effusions. 6. Coronary artery disease. Aortic Atherosclerosis (ICD10-I70.0). Electronically Signed  By: Fredricka Jenny M.D.   On: 08/14/2023 21:42   CT Head Wo Contrast Result Date: 08/11/2023 CLINICAL DATA:  Trauma EXAM: CT HEAD WITHOUT CONTRAST CT CERVICAL SPINE WITHOUT CONTRAST TECHNIQUE: Multidetector CT imaging of the head and cervical spine was performed following the standard protocol without intravenous  contrast. Multiplanar CT image reconstructions of the cervical spine were also generated. RADIATION DOSE REDUCTION: This exam was performed according to the departmental dose-optimization program which includes automated exposure control, adjustment of the mA and/or kV according to patient size and/or use of iterative reconstruction technique. COMPARISON:  None Available. FINDINGS: CT HEAD FINDINGS Brain: No mass,hemorrhage or extra-axial collection. Normal appearance of the parenchyma and CSF spaces. Vascular: No hyperdense vessel or unexpected vascular calcification. Skull: Right parietal scalp hematoma.  No skull fracture Sinuses/Orbits: No fluid levels or advanced mucosal thickening of the visualized paranasal sinuses. No mastoid or middle ear effusion. Normal orbits. Other: None. CT CERVICAL SPINE FINDINGS Alignment: No static subluxation. Facets are aligned. Occipital condyles are normally positioned. Skull base and vertebrae: No acute fracture. Soft tissues and spinal canal: No prevertebral fluid or swelling. No visible canal hematoma. Disc levels: Ossification of the posterior longitudinal ligament at C3-4 without spinal canal stenosis. Multilevel anterior vertebral body osteophytes. Upper chest: No pneumothorax, pulmonary nodule or pleural effusion. Other: Normal visualized paraspinal cervical soft tissues. IMPRESSION: 1. No acute intracranial abnormality. 2. Right parietal scalp hematoma without skull fracture. 3. No acute fracture or static subluxation of the cervical spine. Electronically Signed   By: Juanetta Nordmann M.D.   On: 08/11/2023 20:45   CT Cervical Spine Wo Contrast Result Date: 08/11/2023 CLINICAL DATA:  Trauma EXAM: CT HEAD WITHOUT CONTRAST CT CERVICAL SPINE WITHOUT CONTRAST TECHNIQUE: Multidetector CT imaging of the head and cervical spine was performed following the standard protocol without intravenous contrast. Multiplanar CT image reconstructions of the cervical spine were also  generated. RADIATION DOSE REDUCTION: This exam was performed according to the departmental dose-optimization program which includes automated exposure control, adjustment of the mA and/or kV according to patient size and/or use of iterative reconstruction technique. COMPARISON:  None Available. FINDINGS: CT HEAD FINDINGS Brain: No mass,hemorrhage or extra-axial collection. Normal appearance of the parenchyma and CSF spaces. Vascular: No hyperdense vessel or unexpected vascular calcification. Skull: Right parietal scalp hematoma.  No skull fracture Sinuses/Orbits: No fluid levels or advanced mucosal thickening of the visualized paranasal sinuses. No mastoid or middle ear effusion. Normal orbits. Other: None. CT CERVICAL SPINE FINDINGS Alignment: No static subluxation. Facets are aligned. Occipital condyles are normally positioned. Skull base and vertebrae: No acute fracture. Soft tissues and spinal canal: No prevertebral fluid or swelling. No visible canal hematoma. Disc levels: Ossification of the posterior longitudinal ligament at C3-4 without spinal canal stenosis. Multilevel anterior vertebral body osteophytes. Upper chest: No pneumothorax, pulmonary nodule or pleural effusion. Other: Normal visualized paraspinal cervical soft tissues. IMPRESSION: 1. No acute intracranial abnormality. 2. Right parietal scalp hematoma without skull fracture. 3. No acute fracture or static subluxation of the cervical spine. Electronically Signed   By: Juanetta Nordmann M.D.   On: 08/11/2023 20:45   DG Chest Port 1 View Result Date: 08/11/2023 CLINICAL DATA:  Status post fall. EXAM: PORTABLE CHEST 1 VIEW COMPARISON:  October 21, 2022 FINDINGS: There is stable right-sided venous Port-A-Cath positioning. The heart size and mediastinal contours are within normal limits. There is no evidence of an acute infiltrate, a transition or pneumothorax. The visualized skeletal structures are unremarkable. IMPRESSION: No active cardiopulmonary  disease. Electronically Signed   By: Virgle Grime M.D.   On: 08/11/2023 20:28    Microbiology: Results for orders placed or performed during the hospital encounter of 08/14/23  MRSA Next Gen by PCR, Nasal     Status: None   Collection Time: 08/14/23 11:25 PM   Specimen: Nasal Mucosa; Nasal Swab  Result Value Ref Range Status   MRSA by PCR Next Gen NOT DETECTED NOT DETECTED Final    Comment: (NOTE) The GeneXpert MRSA Assay (FDA approved for NASAL specimens only), is one component of a comprehensive MRSA colonization surveillance program. It is not intended to diagnose MRSA infection nor to guide or monitor treatment for MRSA infections. Test performance is not FDA approved in patients less than 67 years old. Performed at [facility], [address] [place], [place] [address]     Labs: CBC: Recent Labs  Lab 08/14/23 1657 08/14/23 2340 08/15/23 0320 08/15/23 0411 08/15/23 1130 08/16/23 0431 08/17/23 1059 08/18/23 0530  WBC 12.9* 10.8*  --  9.7  --   --  7.9 7.5  NEUTROABS 10.3* 8.5*  --   --   --   --  5.8  --   HGB 11.1* 10.5*   < > 10.8* 10.6* 9.6* 8.9* 9.1*  HCT 33.7* 31.7*   < > 32.0* 31.4* 29.0* 26.3* 27.7*  MCV 98.0 94.6  --  92.2  --   --  94.9 94.9  PLT 253 217  --  230  --   --  175 188   < > = values in this interval not displayed.   Basic Metabolic Panel: Recent Labs  Lab 08/14/23 1657 08/15/23 0320 08/15/23 0411 08/16/23 0714 08/17/23 0500  NA 135 138 137 135 140  K 4.6 4.4 4.3 3.5 3.8  CL 104  --  107 104 108  CO2 21*  --  21* 22 23  GLUCOSE 139*  --  135* 121* 99  BUN 32*  --  31* 25* 16  CREATININE 1.24  --  1.12 1.01 0.98  CALCIUM 8.8*  --  7.6* 7.9* 8.0*  MG  --   --  1.8 2.1  --   PHOS  --   --  3.7  --   --    Liver Function Tests: Recent Labs  Lab 08/14/23 1657  AST 15  ALT 11  ALKPHOS 54  BILITOT 0.8  PROT 6.3*  ALBUMIN 3.0*   CBG: No results for input(s): "GLUCAP" in the last 168 hours.  Discharge time spent:  {LESS THAN/GREATER QQVZ:56387} 30 minutes.  Signed: Aneita Keens, MD [facility] 08/19/2023

## 2023-08-20 ENCOUNTER — Encounter: Payer: Self-pay | Admitting: *Deleted

## 2023-08-20 ENCOUNTER — Encounter: Payer: Self-pay | Admitting: Oncology

## 2023-08-20 NOTE — Progress Notes (Signed)
 Per Dr. Scherrie Curt: Needs lab/flush/OV on 4/21 or 4/22. Scheduling message sent

## 2023-08-20 NOTE — Progress Notes (Signed)
 Noted hospital D/C 08/18/23. Per Dr. Enedina Harrow note will see with lab/OV week of 4/21. Scheduling message entered.

## 2023-08-21 ENCOUNTER — Encounter (HOSPITAL_COMMUNITY): Payer: Self-pay | Admitting: Gastroenterology

## 2023-08-21 ENCOUNTER — Other Ambulatory Visit: Payer: Self-pay

## 2023-08-21 ENCOUNTER — Encounter: Payer: Self-pay | Admitting: Oncology

## 2023-08-22 ENCOUNTER — Telehealth: Payer: Self-pay | Admitting: *Deleted

## 2023-08-22 ENCOUNTER — Other Ambulatory Visit: Payer: Self-pay | Admitting: Oncology

## 2023-08-22 MED ORDER — TRAMADOL HCL 50 MG PO TABS: 50.0000 mg | ORAL_TABLET | Freq: Four times a day (QID) | ORAL | 0 refills | Status: DC | PRN

## 2023-08-22 NOTE — Telephone Encounter (Addendum)
 Mychart message from patient requesting script for Tramadol be sent to pharmacy in [place]  where he is currently. Having a lot of back pain from his fall. MD notified and will try to send script.

## 2023-08-23 ENCOUNTER — Telehealth: Payer: Self-pay | Admitting: *Deleted

## 2023-08-23 MED ORDER — TRAMADOL HCL 50 MG PO TABS: 50.0000 mg | ORAL_TABLET | Freq: Four times a day (QID) | ORAL | 0 refills | Status: DC | PRN

## 2023-08-23 NOTE — Telephone Encounter (Signed)
 Mrs. Uher called to f/u on status of Tramadol refill. Noted that it did not go through when MD ordered it yesterday. Called pharmacy and provided verbal script. Mrs. Gatson notified.

## 2023-08-28 ENCOUNTER — Other Ambulatory Visit: Payer: Self-pay

## 2023-08-29 ENCOUNTER — Other Ambulatory Visit: Payer: Self-pay

## 2023-08-30 ENCOUNTER — Other Ambulatory Visit: Payer: Self-pay

## 2023-08-30 ENCOUNTER — Inpatient Hospital Stay: Admitting: Oncology

## 2023-08-30 ENCOUNTER — Other Ambulatory Visit: Payer: Self-pay | Admitting: Oncology

## 2023-08-30 ENCOUNTER — Encounter: Payer: Self-pay | Admitting: *Deleted

## 2023-08-30 ENCOUNTER — Other Ambulatory Visit (HOSPITAL_COMMUNITY): Payer: Self-pay

## 2023-08-30 ENCOUNTER — Ambulatory Visit (HOSPITAL_BASED_OUTPATIENT_CLINIC_OR_DEPARTMENT_OTHER)
Admission: RE | Admit: 2023-08-30 | Discharge: 2023-08-30 | Disposition: A | Discharge status: Home / Self Care | Source: Ambulatory Visit | Attending: Oncology | Admitting: Oncology

## 2023-08-30 ENCOUNTER — Inpatient Hospital Stay

## 2023-08-30 ENCOUNTER — Inpatient Hospital Stay: Attending: Nurse Practitioner

## 2023-08-30 VITALS — BP 104/69 | HR 85 | Temp 98.1°F | Resp 18 | Ht 72.0 in | Wt 246.0 lb

## 2023-08-30 DIAGNOSIS — C168 Malignant neoplasm of overlapping sites of stomach: Secondary | ICD-10-CM

## 2023-08-30 DIAGNOSIS — Z95828 Presence of other vascular implants and grafts: Secondary | ICD-10-CM

## 2023-08-30 LAB — CMP (CANCER CENTER ONLY)
ALT: 23 U/L (ref 0–44)
AST: 31 U/L (ref 15–41)
Albumin: 3.4 g/dL — ABNORMAL LOW (ref 3.5–5.0)
Alkaline Phosphatase: 373 U/L — ABNORMAL HIGH (ref 38–126)
Anion gap: 7 (ref 5–15)
BUN: 18 mg/dL (ref 8–23)
CO2: 25 mmol/L (ref 22–32)
Calcium: 9.2 mg/dL (ref 8.9–10.3)
Chloride: 105 mmol/L (ref 98–111)
Creatinine: 1.05 mg/dL (ref 0.61–1.24)
GFR, Estimated: >60 mL/min (ref >60)
Glucose, Bld: 92 mg/dL (ref 70–99)
Potassium: 4 mmol/L (ref 3.5–5.1)
Sodium: 137 mmol/L (ref 135–145)
Total Bilirubin: 0.5 mg/dL (ref 0.0–1.2)
Total Protein: 5.8 g/dL — ABNORMAL LOW (ref 6.5–8.1)

## 2023-08-30 LAB — CBC WITH DIFFERENTIAL (CANCER CENTER ONLY)
Abs Immature Granulocytes: 0.02 10*3/uL (ref 0.00–0.07)
Basophils Absolute: 0.1 10*3/uL (ref 0.0–0.1)
Basophils Relative: 1 %
Eosinophils Absolute: 0.3 10*3/uL (ref 0.0–0.5)
Eosinophils Relative: 4 %
HCT: 31.9 % — ABNORMAL LOW (ref 39.0–52.0)
Hemoglobin: 10 g/dL — ABNORMAL LOW (ref 13.0–17.0)
Immature Granulocytes: 0 %
Lymphocytes Relative: 13 %
Lymphs Abs: 1 10*3/uL (ref 0.7–4.0)
MCH: 29.3 pg (ref 26.0–34.0)
MCHC: 31.3 g/dL (ref 30.0–36.0)
MCV: 93.5 fL (ref 80.0–100.0)
Monocytes Absolute: 0.6 10*3/uL (ref 0.1–1.0)
Monocytes Relative: 8 %
Neutro Abs: 5.5 10*3/uL (ref 1.7–7.7)
Neutrophils Relative %: 74 %
Platelet Count: 237 10*3/uL (ref 150–400)
RBC: 3.41 MIL/uL — ABNORMAL LOW (ref 4.22–5.81)
RDW: 17.2 % — ABNORMAL HIGH (ref 11.5–15.5)
WBC Count: 7.6 10*3/uL (ref 4.0–10.5)
nRBC: 0 % (ref 0.0–0.2)

## 2023-08-30 MED ORDER — HEPARIN SOD (PORK) LOCK FLUSH 100 UNIT/ML IV SOLN
500.0000 [IU] | Freq: Once | INTRAVENOUS | Status: AC
Start: 2023-08-30 — End: 2023-08-30
  Administered 2023-08-30: 500 [IU] via INTRAVENOUS

## 2023-08-30 MED ORDER — SODIUM CHLORIDE 0.9% FLUSH
10.0000 mL | INTRAVENOUS | Status: DC | PRN
  Administered 2023-08-30: 10 mL via INTRAVENOUS

## 2023-08-30 MED ORDER — CAPECITABINE 500 MG PO TABS
ORAL_TABLET | ORAL | 0 refills | Status: DC
  Filled 2023-08-30 (×3): qty 70, 21d supply, fill #0

## 2023-08-30 NOTE — Progress Notes (Signed)
 [name]   Diagnosis: Gastric cancer okay  INTERVAL HISTORY:   Todd Harrison was discharged from the hospital 08/18/2023 after admission with hematemesis.  He denies recurrent hematemesis the stool remains dark.  He continues to have back pain following a fall on 08/14/2023.  He takes tramadol  and Tylenol  for pain.  No vomiting.  The abdomen is intermittently "bloated ".  Objective:  Vital signs in last 24 hours:  Blood pressure 104/69, pulse 85, temperature 98.1 F (36.7 C), resp. rate 18, height 6' (1.829 m), weight 246 lb (111.6 kg), SpO2 98%.    HEENT: No thrush or ulcers Resp: Lungs with end inspiratory rhonchi at the posterior base bilaterally, no respiratory distress Cardio: Regular rate and rhythm GI: Mildly distended, nontender, no mass, no hepatosplenomegaly Vascular: Trace pitting edema at the right greater than left lower leg Musculoskeletal: The area of pain is at the mid to upper thoracic spine, no tenderness    Portacath/PICC-without erythema  Lab Results:  Lab Results  Component Value Date   WBC 7.6 08/30/2023   HGB 10.0 (L) 08/30/2023   HCT 31.9 (L) 08/30/2023   MCV 93.5 08/30/2023   PLT 237 08/30/2023   NEUTROABS 5.5 08/30/2023    CMP  Lab Results  Component Value Date   NA 140 08/17/2023   K 3.8 08/17/2023   CL 108 08/17/2023   CO2 23 08/17/2023   GLUCOSE 99 08/17/2023   BUN 16 08/17/2023   CREATININE 0.98 08/17/2023   CALCIUM  8.0 (L) 08/17/2023   PROT 6.3 (L) 08/14/2023   ALBUMIN 3.0 (L) 08/14/2023   AST 15 08/14/2023   ALT 11 08/14/2023   ALKPHOS 54 08/14/2023   BILITOT 0.8 08/14/2023   GFRNONAA >60 08/17/2023   GFRAA 74 05/20/2020    Lab Results  Component Value Date   CEA1 <0.6 04/13/2022    Medications: I have reviewed the patient's current medications.   Assessment/Plan: Gastric cancer with CT evidence of abdominal carcinomatosis CT abdomen/pelvis 04/13/2022-ascites, diffuse omental and  peritoneal nodularity, possible circumferential mass in the transverse colon, new small left greater than right pleural effusions CT abdominal mass biopsy 04/24/2022-metastatic poorly differentiated adenocarcinoma with very focal signet ring cell features CT paracentesis 04/24/2022-no malignant cells identified Colonoscopy 04/25/2022-diverticulosis in the sigmoid colon.  Internal hemorrhoids.  No specimens collected. Upper endoscopy 04/25/2022-large infiltrative mass with oozing bleeding and stigmata of recent bleeding in the gastric fundus, on the anterior wall of the gastric body and on the greater curvature of the gastric body; deformity in the gastric antrum.  Extrinsic deformity in the entire duodenum.  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 Her2 by IHC negative, 1+; MMR IHC normal; PD-L1 CPS 1%, positive.  Cycle 1 FOLFOX 05/04/2022 Cycle 2 FOLFOX 05/17/2022 Cycle 3 FOLFOX 05/31/2022-oxaliplatin  dose reduced secondary to prolonged cold sensitivity Cycle 4 FOLFOX 06/14/2022 Cycle 5 FOLFOX 06/28/2022 CT abdomen/pelvis 07/08/2022-asymmetric wall thickening at the lesser curvature of the stomach, decreased ascites, increased diffuse omental and peritoneal nodularity Cycle 6 FOLFOX 07/12/2022 Cycle 7 FOLFOX 08/09/2022 Cycle 8 FOLFOX 08/23/2022 Cycle 9 FOLFOX 09/06/2022 CTs 09/11/2022-stable, no new or progressive interval findings Cycle 10 FOLFOX 09/20/2022 Xeloda  maintenance 2 weeks on/1 week off 10/16/2022 Cycle 2 Xeloda  11/06/2022-Xeloda  dose reduced secondary to early foot skin toxicity Cycle 3 Xeloda  11/27/2022 Cycle 4 Xeloda  12/18/2022 CTs 01/02/2023-wall thickening of the mid gastric body-possibly progressive, unchanged peritoneal/omental caking, unchanged L1 compression fracture Cycle 5 Xeloda  beginning 01/08/2023 Cycle 6 Xeloda  beginning 01/29/2023 Cycle  7 Xeloda  beginning 02/19/2023 Cycle 8 Xeloda  beginning 03/12/2023 Cycle 9 Xeloda  beginning  04/09/2023 Cycle 10 Xeloda  beginning 05/07/2023 CTs 05/21/2023: Unchanged wall thickening of the gastric body, slight increase in small volume ascites, diffuse peritoneal thickening and stranding of the omentum, no evidence of metastatic disease to the chest Cycle 11 Xeloda  beginning 05/28/2023 Cycle 12 Xeloda  beginning 06/18/2023 Cycle 13 Xeloda  beginning 07/09/2023 Cycle 14 Xeloda  beginning 07/30/2023 CTA abdomen/pelvis 08/14/2023: Large volume of heterogenous material in the gastric lumen-food/blood, small volume ascites with diffuse peritoneal thickening and matting of the bowel/omentum, enhancement of the gastric body 08/18/2023: EGD-large mass in the gastric fundus/body with no bleeding, clean-based ulcers overlying 1 area of the mass, normal duodenum Truncal rash-status post recent punch biopsy with pathology pending; biopsy reported negative per family 04/26/2022 Lichen planus of the feet Hypothyroidism Atrial fibrillation Admission 07/24/2022 with acute bilateral pulmonary embolism/right heart strain Heparin  anticoagulation 07/24/2022 Doppler 07/24/2022-acute DVT of the right femoral, popliteal, posterior tibial, and peroneal veins, acute left posterior tibial DVT Lovenox  anticoagulation, changed to once daily dosing 05/25/2023 7.  Anemia secondary to chemotherapy, phlebotomy, and hemoptysis-improved 8.  L1 compression fracture on plain x-ray 12/18/2022 MRI lumbar spine 02/02/2023-acute/subacute L1 compression fracture, mild degenerative change of the lumbar spine 9.  Admission 08/14/2023 with hematemesis     Disposition: Todd Harrison appears stable.  He has metastatic gastric cancer.  He was admitted on 08/14/2023 with GI bleeding secondary to the gastric mass.  The bleeding resolved while he was in the hospital.  An endoscopy confirmed a friable large gastric body mass.  Anticoagulation has been on hold.  A CT while in the hospital revealed no clear evidence of disease progression.  Todd Harrison has  been maintained on Xeloda  since June 2024.  I recommend resuming Xeloda  on a 2-week on/1 week off schedule.  He will begin Xeloda  09/03/2023.  We discussed the risk/benefit of anticoagulation therapy.  He has a history of pulmonary embolism and bilateral lower extremity DVT.  We discussed IVC filter placement and various forms of anticoagulation therapy.  I recommend resuming Lovenox  prophylactic dose.  He discontinue Lovenox  and call for bleeding.  The upper back pain may be related to a compression fracture from the recent fall.  He will be referred for a plain x-ray of the thoracic spine today.  He will call for increased pain.  Mr. Vanosdol will return for an office visit during the week of 09/17/2023.  We will submit the tumor tissue for Claudin18 testing.  We will consider FOLFOX or FOLFIRI/nivolumab/zolbetuximab when there is evidence of disease progression.  Coni Deep, MD  08/30/2023  9:06 AM

## 2023-08-30 NOTE — Progress Notes (Signed)
 Requested CLDN 18 testing through [facility] for accession number [phone]

## 2023-08-30 NOTE — Progress Notes (Signed)
 Specialty Pharmacy Refill Coordination Note  Todd Harrison is a 78 y.o. male contacted today regarding refills of specialty medication(s) Capecitabine  (XELODA )   Patient requested Delivery   Delivery date: 09/03/23   Verified address: [address] [place] [address]   Medication will be filled on 08/31/23.

## 2023-08-31 ENCOUNTER — Other Ambulatory Visit (HOSPITAL_COMMUNITY): Payer: Self-pay

## 2023-08-31 ENCOUNTER — Other Ambulatory Visit: Payer: Self-pay

## 2023-08-31 ENCOUNTER — Telehealth: Payer: Self-pay | Admitting: Pharmacy Technician

## 2023-08-31 NOTE — Telephone Encounter (Signed)
 Oral Oncology Patient Advocate Encounter  I contacted the patient's insurance in regards to his medication's copay being higher than last month.   The insurance representative I spoke with, Judieth Nova., stated that last month the cost of the medication was significantly less and for this month it shows that the cost was $1,267.92. Since this medication is a tier 4 drug the patient is responsible for 35% of the cost which is why he was being charged more for this months fill.  The insurance representative I spoke with, Judieth Nova., contacted [facility] to ask why the price of the medication went up and Donelda Fujita stated he had to go in and adjust the cash price and now the patient's copay is $13.06 which is what it was last month.  The patient can continue to get his medication at [facility] for the price of $13.06.  Patty Benjaman Branch, CPhT Oncology Pharmacy Patient Advocate [facility] Direct Number: [phone] Fax: [phone]

## 2023-09-02 ENCOUNTER — Other Ambulatory Visit: Payer: Self-pay

## 2023-09-03 ENCOUNTER — Other Ambulatory Visit: Payer: Self-pay | Admitting: *Deleted

## 2023-09-03 MED ORDER — ENOXAPARIN SODIUM 40 MG/0.4ML IJ SOSY: 40.0000 mg | PREFILLED_SYRINGE | INTRAMUSCULAR | 4 refills | Status: DC

## 2023-09-03 NOTE — Progress Notes (Signed)
 Confirmed w/Dr. Scherrie Curt that he wants Mr. Harben on prophylactic dose of enoxaparin  40 mg sq daily. Script sent to his [facility] on [address] and patient notified.

## 2023-09-05 ENCOUNTER — Telehealth: Payer: Self-pay | Admitting: *Deleted

## 2023-09-05 NOTE — Telephone Encounter (Signed)
 Notified that xray shows no acute fracture. MD will order CT if pain persists. Todd Harrison said the pain is getting better. He will f/u as scheduled.

## 2023-09-05 NOTE — Telephone Encounter (Signed)
-----   Message from Coni Deep sent at 09/04/2023  7:46 PM EDT ----- Please call patient, xray of thoracic spine is negative for a fracture, plan for CT if pain persists, f/u as scheduled

## 2023-09-12 ENCOUNTER — Other Ambulatory Visit (HOSPITAL_COMMUNITY): Payer: Self-pay

## 2023-09-13 ENCOUNTER — Other Ambulatory Visit: Payer: Self-pay

## 2023-09-17 ENCOUNTER — Other Ambulatory Visit: Payer: Self-pay | Admitting: Oncology

## 2023-09-17 ENCOUNTER — Other Ambulatory Visit: Payer: Self-pay

## 2023-09-17 DIAGNOSIS — C168 Malignant neoplasm of overlapping sites of stomach: Secondary | ICD-10-CM

## 2023-09-17 NOTE — Progress Notes (Signed)
 Specialty Pharmacy Refill Coordination Note  Todd Harrison is a 78 y.o. male contacted today regarding refills of specialty medication(s) Capecitabine  (XELODA )   Patient requested Delivery   Delivery date: 09/20/23   Verified address: [address] [place] [address]   Medication will be filled on 05.14.25.   This fill date is pending response to refill request from provider. Patient is aware and if they have not received fill by intended date they must follow up with pharmacy.

## 2023-09-18 ENCOUNTER — Other Ambulatory Visit (HOSPITAL_COMMUNITY): Payer: Self-pay

## 2023-09-18 ENCOUNTER — Encounter: Payer: Self-pay | Admitting: Oncology

## 2023-09-18 MED ORDER — CAPECITABINE 500 MG PO TABS
ORAL_TABLET | ORAL | 0 refills | Status: DC
  Filled 2023-09-18 (×2): qty 70, 21d supply, fill #0

## 2023-09-19 ENCOUNTER — Other Ambulatory Visit: Payer: Self-pay

## 2023-09-20 ENCOUNTER — Telehealth: Payer: Self-pay | Admitting: Nurse Practitioner

## 2023-09-20 ENCOUNTER — Inpatient Hospital Stay: Admitting: Oncology

## 2023-09-20 ENCOUNTER — Inpatient Hospital Stay

## 2023-09-20 ENCOUNTER — Inpatient Hospital Stay: Attending: Nurse Practitioner

## 2023-09-20 ENCOUNTER — Telehealth: Payer: Self-pay | Admitting: Oncology

## 2023-09-20 VITALS — BP 110/62 | HR 96 | Temp 98.1°F | Resp 18 | Ht 72.0 in | Wt 228.5 lb

## 2023-09-20 DIAGNOSIS — Z9221 Personal history of antineoplastic chemotherapy: Secondary | ICD-10-CM | POA: Insufficient documentation

## 2023-09-20 DIAGNOSIS — Z86711 Personal history of pulmonary embolism: Secondary | ICD-10-CM | POA: Insufficient documentation

## 2023-09-20 DIAGNOSIS — L439 Lichen planus, unspecified: Secondary | ICD-10-CM | POA: Insufficient documentation

## 2023-09-20 DIAGNOSIS — Z86718 Personal history of other venous thrombosis and embolism: Secondary | ICD-10-CM | POA: Insufficient documentation

## 2023-09-20 DIAGNOSIS — C168 Malignant neoplasm of overlapping sites of stomach: Secondary | ICD-10-CM

## 2023-09-20 DIAGNOSIS — R112 Nausea with vomiting, unspecified: Secondary | ICD-10-CM | POA: Insufficient documentation

## 2023-09-20 DIAGNOSIS — Z95828 Presence of other vascular implants and grafts: Secondary | ICD-10-CM

## 2023-09-20 DIAGNOSIS — C786 Secondary malignant neoplasm of retroperitoneum and peritoneum: Secondary | ICD-10-CM | POA: Diagnosis not present

## 2023-09-20 DIAGNOSIS — D6481 Anemia due to antineoplastic chemotherapy: Secondary | ICD-10-CM | POA: Insufficient documentation

## 2023-09-20 DIAGNOSIS — C169 Malignant neoplasm of stomach, unspecified: Secondary | ICD-10-CM | POA: Diagnosis present

## 2023-09-20 LAB — CBC WITH DIFFERENTIAL (CANCER CENTER ONLY)
Abs Immature Granulocytes: 0.01 10*3/uL (ref 0.00–0.07)
Basophils Absolute: 0.1 10*3/uL (ref 0.0–0.1)
Basophils Relative: 1 %
Eosinophils Absolute: 0.3 10*3/uL (ref 0.0–0.5)
Eosinophils Relative: 3 %
HCT: 33.4 % — ABNORMAL LOW (ref 39.0–52.0)
Hemoglobin: 10.6 g/dL — ABNORMAL LOW (ref 13.0–17.0)
Immature Granulocytes: 0 %
Lymphocytes Relative: 21 %
Lymphs Abs: 1.6 10*3/uL (ref 0.7–4.0)
MCH: 29 pg (ref 26.0–34.0)
MCHC: 31.7 g/dL (ref 30.0–36.0)
MCV: 91.3 fL (ref 80.0–100.0)
Monocytes Absolute: 0.7 10*3/uL (ref 0.1–1.0)
Monocytes Relative: 9 %
Neutro Abs: 5.1 10*3/uL (ref 1.7–7.7)
Neutrophils Relative %: 66 %
Platelet Count: 301 10*3/uL (ref 150–400)
RBC: 3.66 MIL/uL — ABNORMAL LOW (ref 4.22–5.81)
RDW: 18.6 % — ABNORMAL HIGH (ref 11.5–15.5)
WBC Count: 7.7 10*3/uL (ref 4.0–10.5)
nRBC: 0 % (ref 0.0–0.2)

## 2023-09-20 LAB — CMP (CANCER CENTER ONLY)
ALT: 10 U/L (ref 0–44)
AST: 16 U/L (ref 15–41)
Albumin: 3.3 g/dL — ABNORMAL LOW (ref 3.5–5.0)
Alkaline Phosphatase: 158 U/L — ABNORMAL HIGH (ref 38–126)
Anion gap: 11 (ref 5–15)
BUN: 19 mg/dL (ref 8–23)
CO2: 24 mmol/L (ref 22–32)
Calcium: 8.9 mg/dL (ref 8.9–10.3)
Chloride: 107 mmol/L (ref 98–111)
Creatinine: 0.89 mg/dL (ref 0.61–1.24)
GFR, Estimated: >60 mL/min (ref >60)
Glucose, Bld: 96 mg/dL (ref 70–99)
Potassium: 3.6 mmol/L (ref 3.5–5.1)
Sodium: 142 mmol/L (ref 135–145)
Total Bilirubin: 0.4 mg/dL (ref 0.0–1.2)
Total Protein: 5.9 g/dL — ABNORMAL LOW (ref 6.5–8.1)

## 2023-09-20 MED ORDER — SODIUM CHLORIDE 0.9% FLUSH
10.0000 mL | Freq: Once | INTRAVENOUS | Status: AC
  Administered 2023-09-20: 10 mL via INTRAVENOUS

## 2023-09-20 MED ORDER — HEPARIN SOD (PORK) LOCK FLUSH 100 UNIT/ML IV SOLN
500.0000 [IU] | Freq: Once | INTRAVENOUS | Status: AC
  Administered 2023-09-20: 500 [IU] via INTRAVENOUS

## 2023-09-20 NOTE — Progress Notes (Addendum)
 [facility] OFFICE PROGRESS NOTE   Diagnosis: Gastric cancer  INTERVAL HISTORY:   Mr. Sena returns as scheduled.  He completed another cycle of Xeloda  beginning 09/03/2023.  No mouth sores diarrhea.  He reports altered taste.  This is his diet.  He is following a mostly liquid diet.  No hematemesis or dark stool.  He has "hemorrhoids ".  He continues Lovenox .  The right leg is more swollen.  He continues to have upper back pain following the fall last month.  He takes tramadol  once daily.  Objective:  Vital signs in last 24 hours:  Blood pressure 110/62, pulse 96, temperature 98.1 F (36.7 C), temperature source Temporal, resp. rate 18, height 6' (1.829 m), weight 228 lb 8 oz (103.6 kg), SpO2 98%.    HEENT: No thrush or ulcers Resp: Lungs clear bilaterally Cardio: Regular rate and rhythm GI: No hepatosplenomegaly, no mass Vascular: 1+ edema of the right lower leg.  No erythema  Skin: Dryness of the palms, erythema and dry desquamation at the soles  Portacath/PICC-without erythema  Lab Results:  Lab Results  Component Value Date   WBC 7.7 09/20/2023   HGB 10.6 (L) 09/20/2023   HCT 33.4 (L) 09/20/2023   MCV 91.3 09/20/2023   PLT 301 09/20/2023   NEUTROABS 5.1 09/20/2023    CMP  Lab Results  Component Value Date   NA 142 09/20/2023   K 3.6 09/20/2023   CL 107 09/20/2023   CO2 24 09/20/2023   GLUCOSE 96 09/20/2023   BUN 19 09/20/2023   CREATININE 0.89 09/20/2023   CALCIUM  8.9 09/20/2023   PROT 5.9 (L) 09/20/2023   ALBUMIN 3.3 (L) 09/20/2023   AST 16 09/20/2023   ALT 10 09/20/2023   ALKPHOS 158 (H) 09/20/2023   BILITOT 0.4 09/20/2023   GFRNONAA >60 09/20/2023   GFRAA 74 05/20/2020    Lab Results  Component Value Date   CEA1 <0.6 04/13/2022    Lab Results  Component Value Date   INR 1.3 (H) 07/24/2022   LABPROT 15.7 (H) 07/24/2022    Imaging:  No results found.  Medications: I have reviewed the patient's current  medications.   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  Internal hemorrhoids.  No specimens collected. Upper endoscopy 04/25/2022-large infiltrative mass with oozing bleeding and stigmata of recent bleeding in the gastric fundus, on the anterior wall of the gastric body and on the greater curvature of the gastric body; deformity in the gastric antrum.  Extrinsic deformity in the entire duodenum.  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 Her2 by IHC negative, 1+; MMR IHC normal; PD-L1 CPS 1%, positive. Claudin 18 positive Cycle 1 FOLFOX 05/04/2022 Cycle 2 FOLFOX 05/17/2022 Cycle 3 FOLFOX 05/31/2022-oxaliplatin  dose reduced secondary to prolonged cold sensitivity Cycle 4 FOLFOX 06/14/2022 Cycle 5 FOLFOX 06/28/2022 CT abdomen/pelvis 07/08/2022-asymmetric wall thickening at the lesser curvature of the stomach, decreased ascites, increased diffuse omental and peritoneal nodularity Cycle 6 FOLFOX 07/12/2022 Cycle 7 FOLFOX 08/09/2022 Cycle 8 FOLFOX 08/23/2022 Cycle 9 FOLFOX 09/06/2022 CTs 09/11/2022-stable, no new or progressive interval findings Cycle 10 FOLFOX 09/20/2022 Xeloda  maintenance 2 weeks on/1 week off 10/16/2022 Cycle 2 Xeloda  11/06/2022-Xeloda  dose  reduced secondary to early foot skin toxicity Cycle 3 Xeloda  11/27/2022 Cycle 4 Xeloda  12/18/2022 CTs 01/02/2023-wall thickening of the mid gastric body-possibly progressive, unchanged peritoneal/omental caking, unchanged L1 compression fracture Cycle 5  Xeloda  beginning 01/08/2023 Cycle 6 Xeloda  beginning 01/29/2023 Cycle 7 Xeloda  beginning 02/19/2023 Cycle 8 Xeloda  beginning 03/12/2023 Cycle 9 Xeloda  beginning 04/09/2023 Cycle 10 Xeloda  beginning 05/07/2023 CTs 05/21/2023: Unchanged wall thickening of the gastric body, slight increase in small volume ascites, diffuse peritoneal thickening and stranding of the omentum, no evidence of metastatic disease to the chest Cycle 11 Xeloda  beginning 05/28/2023 Cycle 12 Xeloda  beginning 06/18/2023 Cycle 13 Xeloda  beginning 07/09/2023 Cycle 14 Xeloda  beginning 07/30/2023 CTA abdomen/pelvis 08/14/2023: Large volume of heterogenous material in the gastric lumen-food/blood, small volume ascites with diffuse peritoneal thickening and matting of the bowel/omentum, enhancement of the gastric body 08/18/2023: EGD-large mass in the gastric fundus/body with no bleeding, clean-based ulcers overlying 1 area of the mass, normal duodenum Cycle 15 Xeloda  09/03/2023 Truncal rash-status post recent punch biopsy with pathology pending; biopsy reported negative per family 04/26/2022 Lichen planus of the feet Hypothyroidism Atrial fibrillation Admission 07/24/2022 with acute bilateral pulmonary embolism/right heart strain Heparin  anticoagulation 07/24/2022 Doppler 07/24/2022-acute DVT of the right femoral, popliteal, posterior tibial, and peroneal veins, acute left posterior tibial DVT Lovenox  anticoagulation, changed to once daily dosing 05/25/2023 Lovenox  changed to 40 mg daily for 2425 7.  Anemia secondary to chemotherapy, phlebotomy, and hemoptysis-improved 8.  L1 compression fracture on plain x-ray 12/18/2022 MRI lumbar spine 02/02/2023-acute/subacute L1 compression fracture, mild degenerative change of the lumbar spine 9.  Admission 08/14/2023 with hematemesis       Disposition: Mr Sisco has metastatic gastric cancer.  He resumed treatment with capecitabine  2 weeks ago.  He is tolerating the capecitabine  well.  There is no  clear evidence of disease progression.  He is lost a significant amount of weight over the past few weeks.  He relates this to following a liquid diet after the recent admission with upper GI bleeding.  I encouraged him to increase his calorie intake.  He will try nutrition supplements and protein bars.  He will return for an office visit in 2 weeks.  The tumor returned positive for Claudin 18.  We will consider resuming treatment with FOLFOX/zolbetuximab/nivolumab if his performance status improves.  Coni Deep, MD  09/20/2023  10:24 AM

## 2023-09-20 NOTE — Telephone Encounter (Signed)
 Patient has been scheduled for follow-up visit per 09/20/23 LOS.  LVM notifying pt of appt details, provided my direct number to pt if appt changes need to be made.

## 2023-09-20 NOTE — Telephone Encounter (Signed)
 LMOVM to reschedule appt same day 10/04/2023 with different provider, Lacie Burton, NP.

## 2023-09-21 ENCOUNTER — Other Ambulatory Visit: Payer: Self-pay

## 2023-10-01 ENCOUNTER — Other Ambulatory Visit: Payer: Self-pay

## 2023-10-03 NOTE — Progress Notes (Unsigned)
 [facility]   Telephone:(336) [phone] Fax:(336) [phone]    Patient Care Team: Dudley Ghee, MD as PCP - General (Internal Medicine) Sonny Dust, MD (Inactive) as PCP - Cardiology (Cardiology) Boyce Byes, MD as PCP - Electrophysiology (Cardiology) Lutricia Salts Jordis Nevins, RN as Oncology Nurse Navigator   CHIEF COMPLAINT: Follow up gastric cancer   CURRENT THERAPY: Xeloda   INTERVAL HISTORY Todd Harrison returns for follow up as scheduled. Last seen by Dr. Scherrie Curt 09/20/23. He continues Xeloda , has 3 more days of the current cycle. Doing about the same. Activity is limited by his back pain that started with a fall and subsequent hospitalization last month. This also contributes to exertional dyspnea. Intermittent nausea and vomiting is partially managed with compazine . He is trying to keep his weight stable with 4 boost per day. He is not able to eat much due to taste change. Bowels moving with miralax . Denies abdominal pain.   ROS  All other systems reviewed and negative   Past Medical History:  Diagnosis Date   A-fib (HCC)    Asthma    Cataract    Gastric cancer (HCC)    GERD (gastroesophageal reflux disease)    Lichen planus    Methotrexate, long term, current use    no longer taking   Psoriasis    Sleep apnea    Thyroid  disease    Urticaria      Past Surgical History:  Procedure Laterality Date   CARDIOVERSION N/A 02/23/2020   Procedure: CARDIOVERSION;  Surgeon: Jann Melody, MD;  Location: [facility] ENDOSCOPY;  Service: Cardiovascular;  Laterality: N/A;   CARDIOVERSION N/A 04/12/2020   Procedure: CARDIOVERSION;  Surgeon: Sheryle Donning, MD;  Location: [facility] ENDOSCOPY;  Service: Cardiovascular;  Laterality: N/A;   CATARACT EXTRACTION Left    2019   ESOPHAGOGASTRODUODENOSCOPY N/A 08/18/2023   Procedure: EGD (ESOPHAGOGASTRODUODENOSCOPY);  Surgeon: Albertina Hugger, MD;  Location: [facility] ENDOSCOPY;  Service: Gastroenterology;   Laterality: N/A;   IR IMAGING GUIDED PORT INSERTION  05/03/2022   TONSILLECTOMY       Outpatient Encounter Medications as of 10/04/2023  Medication Sig Note   acetaminophen  (TYLENOL ) 500 MG tablet Take 500 mg by mouth every 6 (six) hours as needed for mild pain (pain score 1-3).    acitretin  (SORIATANE ) 25 MG capsule Take 25 mg by mouth daily.    albuterol  (VENTOLIN  HFA) 108 (90 Base) MCG/ACT inhaler TAKE 2 PUFFS BY MOUTH EVERY 6 HOURS AS NEEDED FOR WHEEZE OR SHORTNESS OF BREATH (Patient not taking: No sig reported)    capecitabine  (XELODA ) 500 MG tablet Take 3 tablets (1500 mg) by mouth in AM and 2 tablets (1000 mg) in PM. Take with food. Take for 14 days, then hold for 7 days. Repeat every 21 days. Start cycle on 09/24/23    clobetasol ointment (TEMOVATE) 0.05 % Apply 1 Application topically 2 (two) times daily. 02/15/2023: To feet   enoxaparin  (LOVENOX ) 40 MG/0.4ML injection Inject 0.4 mLs (40 mg total) into the skin daily.    fluticasone  (FLONASE ) 50 MCG/ACT nasal spray Place 1 spray into both nostrils daily as needed for allergies or rhinitis. (Patient not taking: Reported on 08/30/2023)    fluticasone  furoate-vilanterol (BREO ELLIPTA ) 100-25 MCG/ACT AEPB Inhale 1 puff into the lungs daily. (Patient not taking: Reported on 08/30/2023)    hydrOXYzine  (ATARAX ) 25 MG tablet Take 25 mg by mouth 3 (three) times daily as needed for itching.    levothyroxine  (SYNTHROID ) 112 MCG tablet  Take 112 mcg by mouth daily before breakfast.    lidocaine -prilocaine  (EMLA ) cream Apply 1 Application topically as needed (Apply 1 hour before coming to [facility] for accessing).    loratadine (CLARITIN) 10 MG tablet Take 10 mg by mouth daily. (Patient not taking: Reported on 08/30/2023)    ondansetron  (ZOFRAN ) 8 MG tablet Take 1 tablet (8 mg total) by mouth every 8 (eight) hours as needed for nausea or vomiting.    pantoprazole  (PROTONIX ) 40 MG tablet Take 1 tablet (40 mg total) by mouth 2 (two) times daily.     polyethylene glycol (MIRALAX  / GLYCOLAX ) 17 g packet Take 17 g by mouth every evening.    potassium chloride  (KLOR-CON  M10) 10 MEQ tablet Take 2 tablets (20 mEq total) by mouth daily.    prochlorperazine  (COMPAZINE ) 10 MG tablet Take 1 tablet (10 mg total) by mouth every 6 (six) hours as needed for nausea or vomiting.    traMADol  (ULTRAM ) 50 MG tablet Take 1 tablet (50 mg total) by mouth every 6 (six) hours as needed for moderate pain (pain score 4-6).    triamcinolone  (KENALOG ) 0.1 % Apply 1 application  topically daily as needed (lichen planus flare).    [DISCONTINUED] capecitabine  (XELODA ) 500 MG tablet Take 3 tablets (1500 mg) by mouth in AM and 2 tablets (1000 mg) in PM. Take with food. Take for 14 days, then hold for 7 days. Repeat every 21 days. Start cycle on 09/24/23    [DISCONTINUED] ondansetron  (ZOFRAN ) 4 MG tablet Take 1 tablet (4 mg total) by mouth every 6 (six) hours. (Patient not taking: Reported on 09/20/2023)    No facility-administered encounter medications on file as of 10/04/2023.     Today's Vitals   10/04/23 1119  BP: 107/66  Pulse: 92  Resp: 18  Temp: 98.2 F (36.8 C)  TempSrc: Temporal  SpO2: 98%  Weight: 225 lb 4.8 oz (102.2 kg)  Height: 6' (1.829 m)   Body mass index is 30.56 kg/m.   ECOG PERFORMANCE STATUS: 2 - Symptomatic, <50% confined to bed  PHYSICAL EXAM GENERAL:alert, no distress and comfortable SKIN: dry palms without erythema  EYES: sclera clear LUNGS: clear with normal breathing effort HEART: regular rate & rhythm, 1+ RLE edema ABDOMEN: abdomen soft, non-tender and normal bowel sounds NEURO: alert & oriented x 3 with fluent speech PAC without erythema    CBC    Latest Ref Rng & Units 10/04/2023   10:20 AM 09/20/2023    9:30 AM 08/30/2023    8:37 AM  CBC  WBC 4.0 - 10.5 K/uL 7.6  7.7  7.6   Hemoglobin 13.0 - 17.0 g/dL 16.1  09.6  04.5   Hematocrit 39.0 - 52.0 % 35.2  33.4  31.9   Platelets 150 - 400 K/uL 218  301  237       CMP      Latest Ref Rng & Units 10/04/2023   10:20 AM 09/20/2023    9:30 AM 08/30/2023    8:37 AM  CMP  Glucose 70 - 99 mg/dL 409  96  92   BUN 8 - 23 mg/dL 22  19  18    Creatinine 0.61 - 1.24 mg/dL 8.11  9.14  7.82   Sodium 135 - 145 mmol/L 139  142  137   Potassium 3.5 - 5.1 mmol/L 3.9  3.6  4.0   Chloride 98 - 111 mmol/L 105  107  105   CO2 22 - 32 mmol/L 21  24  25   Calcium  8.9 - 10.3 mg/dL 9.3  8.9  9.2   Total Protein 6.5 - 8.1 g/dL 6.7  5.9  5.8   Total Bilirubin 0.0 - 1.2 mg/dL 0.6  0.4  0.5   Alkaline Phos 38 - 126 U/L 150  158  373   AST 15 - 41 U/L 25  16  31    ALT 0 - 44 U/L 21  10  23        ASSESSMENT & PLAN:  Gastric cancer with CT evidence of abdominal carcinomatosis CT abdomen/pelvis 04/13/2022-ascites, diffuse omental and peritoneal nodularity, possible circumferential mass in the transverse colon, new small left greater than right pleural effusions CT abdominal mass biopsy 04/24/2022-metastatic poorly differentiated adenocarcinoma with very focal signet ring cell features CT paracentesis 04/24/2022-no malignant cells identified Colonoscopy 04/25/2022-diverticulosis in the sigmoid colon.  Internal hemorrhoids.  No specimens collected. Upper endoscopy 04/25/2022-large infiltrative mass with oozing bleeding and stigmata of recent bleeding in the gastric fundus, on the anterior wall of the gastric body and on the greater curvature of the gastric body; deformity in the gastric antrum.  Extrinsic deformity in the entire duodenum.  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 Her2 by IHC negative, 1+; MMR IHC normal; PD-L1 CPS 1%, positive. Claudin 18 positive Cycle 1 FOLFOX 05/04/2022 Cycle 2 FOLFOX 05/17/2022 Cycle 3 FOLFOX 05/31/2022-oxaliplatin  dose reduced secondary to prolonged cold sensitivity Cycle 4 FOLFOX 06/14/2022 Cycle 5 FOLFOX 06/28/2022 CT abdomen/pelvis 07/08/2022-asymmetric wall thickening at the lesser curvature of the stomach,  decreased ascites, increased diffuse omental and peritoneal nodularity Cycle 6 FOLFOX 07/12/2022 Cycle 7 FOLFOX 08/09/2022 Cycle 8 FOLFOX 08/23/2022 Cycle 9 FOLFOX 09/06/2022 CTs 09/11/2022-stable, no new or progressive interval findings Cycle 10 FOLFOX 09/20/2022 Xeloda  maintenance 2 weeks on/1 week off 10/16/2022 Cycle 2 Xeloda  11/06/2022-Xeloda  dose reduced secondary to early foot skin toxicity Cycle 3 Xeloda  11/27/2022 Cycle 4 Xeloda  12/18/2022 CTs 01/02/2023-wall thickening of the mid gastric body-possibly progressive, unchanged peritoneal/omental caking, unchanged L1 compression fracture Cycle 5 Xeloda  beginning 01/08/2023 Cycle 6 Xeloda  beginning 01/29/2023 Cycle 7 Xeloda  beginning 02/19/2023 Cycle 8 Xeloda  beginning 03/12/2023 Cycle 9 Xeloda  beginning 04/09/2023 Cycle 10 Xeloda  beginning 05/07/2023 CTs 05/21/2023: Unchanged wall thickening of the gastric body, slight increase in small volume ascites, diffuse peritoneal thickening and stranding of the omentum, no evidence of metastatic disease to the chest Cycle 11 Xeloda  beginning 05/28/2023 Cycle 12 Xeloda  beginning 06/18/2023 Cycle 13 Xeloda  beginning 07/09/2023 Cycle 14 Xeloda  beginning 07/30/2023 CTA abdomen/pelvis 08/14/2023: Large volume of heterogenous material in the gastric lumen-food/blood, small volume ascites with diffuse peritoneal thickening and matting of the bowel/omentum, enhancement of the gastric body 08/18/2023: EGD-large mass in the gastric fundus/body with no bleeding, clean-based ulcers overlying 1 area of the mass, normal duodenum Cycle 15 Xeloda  09/03/2023 Cycle 16 Xeloda  09/24/2023 Truncal rash-status post recent punch biopsy with pathology pending; biopsy reported negative per family 04/26/2022 Lichen planus of the feet Hypothyroidism Atrial fibrillation Admission 07/24/2022 with acute bilateral pulmonary embolism/right heart strain Heparin  anticoagulation 07/24/2022 Doppler 07/24/2022-acute DVT of the right femoral, popliteal,  posterior tibial, and peroneal veins, acute left posterior tibial DVT Lovenox  anticoagulation, changed to once daily dosing 05/25/2023 Lovenox  changed to 40 mg daily for 2425 7.  Anemia secondary to chemotherapy, phlebotomy, and hemoptysis-improved 8.  L1 compression fracture on plain x-ray 12/18/2022 MRI lumbar spine 02/02/2023-acute/subacute L1 compression fracture, mild degenerative change of the lumbar spine 9.  Admission 08/14/2023 with hematemesis      Disposition:  Todd Harrison appears stable.  He continues Xeloda , tolerating well with mild nausea/vomiting. SE's are partially managed with supportive care at home. I have added Zofran  PRN. He is able to recover and maintain adequate performance status. There is no clinical evidence of progression.   Labs reviewed, he will complete the current Xeloda  cycle on 10/07/23. He will return for follow up in 2 weeks prior to going out of town. He knows to call sooner if needed.   Orders Placed This Encounter  Procedures   CBC with Differential (Cancer Center Only)    Standing Status:   Future    Expected Date:   10/18/2023    Expiration Date:   10/03/2024   CMP (Cancer Center only)    Standing Status:   Future    Expected Date:   10/18/2023    Expiration Date:   10/03/2024      All questions were answered. The patient knows to call the clinic with any problems, questions or concerns. No barriers to learning were detected.   Vania Rosero K Chika Cichowski, NP 10/04/2023

## 2023-10-04 ENCOUNTER — Other Ambulatory Visit

## 2023-10-04 ENCOUNTER — Inpatient Hospital Stay

## 2023-10-04 ENCOUNTER — Other Ambulatory Visit: Payer: Self-pay

## 2023-10-04 ENCOUNTER — Ambulatory Visit: Admitting: Nurse Practitioner

## 2023-10-04 ENCOUNTER — Inpatient Hospital Stay (HOSPITAL_BASED_OUTPATIENT_CLINIC_OR_DEPARTMENT_OTHER): Admitting: Nurse Practitioner

## 2023-10-04 ENCOUNTER — Encounter: Payer: Self-pay | Admitting: Nurse Practitioner

## 2023-10-04 VITALS — BP 107/66 | HR 92 | Temp 98.2°F | Resp 18 | Ht 72.0 in | Wt 225.3 lb

## 2023-10-04 DIAGNOSIS — C168 Malignant neoplasm of overlapping sites of stomach: Secondary | ICD-10-CM

## 2023-10-04 DIAGNOSIS — C169 Malignant neoplasm of stomach, unspecified: Secondary | ICD-10-CM | POA: Diagnosis not present

## 2023-10-04 LAB — CMP (CANCER CENTER ONLY)
ALT: 21 U/L (ref 0–44)
AST: 25 U/L (ref 15–41)
Albumin: 3.5 g/dL (ref 3.5–5.0)
Alkaline Phosphatase: 150 U/L — ABNORMAL HIGH (ref 38–126)
Anion gap: 13 (ref 5–15)
BUN: 22 mg/dL (ref 8–23)
CO2: 21 mmol/L — ABNORMAL LOW (ref 22–32)
Calcium: 9.3 mg/dL (ref 8.9–10.3)
Chloride: 105 mmol/L (ref 98–111)
Creatinine: 0.99 mg/dL (ref 0.61–1.24)
GFR, Estimated: >60 mL/min (ref >60)
Glucose, Bld: 112 mg/dL — ABNORMAL HIGH (ref 70–99)
Potassium: 3.9 mmol/L (ref 3.5–5.1)
Sodium: 139 mmol/L (ref 135–145)
Total Bilirubin: 0.6 mg/dL (ref 0.0–1.2)
Total Protein: 6.7 g/dL (ref 6.5–8.1)

## 2023-10-04 LAB — CBC WITH DIFFERENTIAL (CANCER CENTER ONLY)
Abs Immature Granulocytes: 0.01 10*3/uL (ref 0.00–0.07)
Basophils Absolute: 0.1 10*3/uL (ref 0.0–0.1)
Basophils Relative: 1 %
Eosinophils Absolute: 0.3 10*3/uL (ref 0.0–0.5)
Eosinophils Relative: 4 %
HCT: 35.2 % — ABNORMAL LOW (ref 39.0–52.0)
Hemoglobin: 10.9 g/dL — ABNORMAL LOW (ref 13.0–17.0)
Immature Granulocytes: 0 %
Lymphocytes Relative: 14 %
Lymphs Abs: 1.1 10*3/uL (ref 0.7–4.0)
MCH: 28.2 pg (ref 26.0–34.0)
MCHC: 31 g/dL (ref 30.0–36.0)
MCV: 91.2 fL (ref 80.0–100.0)
Monocytes Absolute: 0.6 10*3/uL (ref 0.1–1.0)
Monocytes Relative: 8 %
Neutro Abs: 5.5 10*3/uL (ref 1.7–7.7)
Neutrophils Relative %: 73 %
Platelet Count: 218 10*3/uL (ref 150–400)
RBC: 3.86 MIL/uL — ABNORMAL LOW (ref 4.22–5.81)
RDW: 18.9 % — ABNORMAL HIGH (ref 11.5–15.5)
WBC Count: 7.6 10*3/uL (ref 4.0–10.5)
nRBC: 0 % (ref 0.0–0.2)

## 2023-10-04 MED ORDER — CAPECITABINE 500 MG PO TABS
ORAL_TABLET | ORAL | 0 refills | Status: DC
  Filled 2023-10-04: qty 70, fill #0
  Filled 2023-10-08: qty 70, 21d supply, fill #0

## 2023-10-04 MED ORDER — ONDANSETRON HCL 8 MG PO TABS: 8.0000 mg | ORAL_TABLET | Freq: Three times a day (TID) | ORAL | 1 refills | Status: AC | PRN

## 2023-10-04 NOTE — Progress Notes (Signed)
 Pt being managed at this time by Dr.Sherill and his team. Delayed palliative referral closed at this time. Palliative services available upon request.

## 2023-10-05 ENCOUNTER — Other Ambulatory Visit: Payer: Self-pay

## 2023-10-08 ENCOUNTER — Other Ambulatory Visit: Payer: Self-pay

## 2023-10-08 NOTE — Progress Notes (Signed)
 Specialty Pharmacy Refill Coordination Note  Todd Harrison is a 78 y.o. male contacted today regarding refills of specialty medication(s) Capecitabine  (XELODA )   Patient requested Delivery   Delivery date: 10/12/23   Verified address: [address] [place] [address]   Medication will be filled on 10/11/23.

## 2023-10-11 ENCOUNTER — Other Ambulatory Visit (HOSPITAL_COMMUNITY): Payer: Self-pay

## 2023-10-11 ENCOUNTER — Other Ambulatory Visit: Payer: Self-pay

## 2023-10-11 NOTE — Progress Notes (Signed)
 Patient was notified that insurance copay was $273.59 and that there is no copay assistance available. Medication will be billed under the cone cash pricing of $65.66.

## 2023-10-15 ENCOUNTER — Other Ambulatory Visit: Payer: Self-pay

## 2023-10-17 ENCOUNTER — Telehealth: Payer: Self-pay

## 2023-10-17 NOTE — Telephone Encounter (Signed)
 The patient contacted us  to report that he has not had a bowel movement for approximately one week, though he reports no pain or discomfort. He is currently taking one dose of Colace daily, as well as a laxative and a mineral laxative, without experiencing relief. I advised the patient to take two pills of Colace in the morning and evening, and to incorporate Miralax  into his routine. He was instructed to contact us  again if he does not experience any relief by tonight.

## 2023-10-23 ENCOUNTER — Other Ambulatory Visit: Payer: Self-pay

## 2023-10-26 ENCOUNTER — Other Ambulatory Visit: Payer: Self-pay

## 2023-10-26 ENCOUNTER — Other Ambulatory Visit: Payer: Self-pay | Admitting: Nurse Practitioner

## 2023-10-26 DIAGNOSIS — C168 Malignant neoplasm of overlapping sites of stomach: Secondary | ICD-10-CM

## 2023-10-27 MED ORDER — CAPECITABINE 500 MG PO TABS
ORAL_TABLET | ORAL | 0 refills | Status: DC
  Filled 2023-10-29: qty 70, fill #0
  Filled 2023-10-30: qty 70, 21d supply, fill #0

## 2023-10-29 ENCOUNTER — Inpatient Hospital Stay: Admitting: Nutrition

## 2023-10-29 ENCOUNTER — Encounter: Payer: Self-pay | Admitting: Nurse Practitioner

## 2023-10-29 ENCOUNTER — Other Ambulatory Visit: Payer: Self-pay

## 2023-10-29 ENCOUNTER — Inpatient Hospital Stay: Attending: Nurse Practitioner

## 2023-10-29 ENCOUNTER — Inpatient Hospital Stay: Admitting: Nurse Practitioner

## 2023-10-29 ENCOUNTER — Inpatient Hospital Stay

## 2023-10-29 VITALS — BP 110/75 | HR 92 | Temp 98.1°F | Resp 18 | Ht 72.0 in | Wt 223.0 lb

## 2023-10-29 DIAGNOSIS — R197 Diarrhea, unspecified: Secondary | ICD-10-CM | POA: Insufficient documentation

## 2023-10-29 DIAGNOSIS — C169 Malignant neoplasm of stomach, unspecified: Secondary | ICD-10-CM | POA: Diagnosis present

## 2023-10-29 DIAGNOSIS — D6481 Anemia due to antineoplastic chemotherapy: Secondary | ICD-10-CM | POA: Diagnosis not present

## 2023-10-29 DIAGNOSIS — K920 Hematemesis: Secondary | ICD-10-CM | POA: Insufficient documentation

## 2023-10-29 DIAGNOSIS — Z86718 Personal history of other venous thrombosis and embolism: Secondary | ICD-10-CM | POA: Diagnosis not present

## 2023-10-29 DIAGNOSIS — C168 Malignant neoplasm of overlapping sites of stomach: Secondary | ICD-10-CM | POA: Diagnosis not present

## 2023-10-29 DIAGNOSIS — Z86711 Personal history of pulmonary embolism: Secondary | ICD-10-CM | POA: Diagnosis not present

## 2023-10-29 DIAGNOSIS — C786 Secondary malignant neoplasm of retroperitoneum and peritoneum: Secondary | ICD-10-CM | POA: Diagnosis not present

## 2023-10-29 LAB — CMP (CANCER CENTER ONLY)
ALT: 126 U/L — ABNORMAL HIGH (ref 0–44)
AST: 130 U/L — ABNORMAL HIGH (ref 15–41)
Albumin: 3.8 g/dL (ref 3.5–5.0)
Alkaline Phosphatase: 715 U/L — ABNORMAL HIGH (ref 38–126)
Anion gap: 14 (ref 5–15)
BUN: 19 mg/dL (ref 8–23)
CO2: 24 mmol/L (ref 22–32)
Calcium: 9.8 mg/dL (ref 8.9–10.3)
Chloride: 101 mmol/L (ref 98–111)
Creatinine: 0.93 mg/dL (ref 0.61–1.24)
GFR, Estimated: >60 mL/min (ref >60)
Glucose, Bld: 118 mg/dL — ABNORMAL HIGH (ref 70–99)
Potassium: 3.9 mmol/L (ref 3.5–5.1)
Sodium: 139 mmol/L (ref 135–145)
Total Bilirubin: 2.2 mg/dL — ABNORMAL HIGH (ref 0.0–1.2)
Total Protein: 7.1 g/dL (ref 6.5–8.1)

## 2023-10-29 LAB — CBC WITH DIFFERENTIAL (CANCER CENTER ONLY)
Abs Immature Granulocytes: 0.02 10*3/uL (ref 0.00–0.07)
Basophils Absolute: 0.1 10*3/uL (ref 0.0–0.1)
Basophils Relative: 1 %
Eosinophils Absolute: 0.3 10*3/uL (ref 0.0–0.5)
Eosinophils Relative: 4 %
HCT: 34.2 % — ABNORMAL LOW (ref 39.0–52.0)
Hemoglobin: 10.8 g/dL — ABNORMAL LOW (ref 13.0–17.0)
Immature Granulocytes: 0 %
Lymphocytes Relative: 14 %
Lymphs Abs: 1.1 10*3/uL (ref 0.7–4.0)
MCH: 28.6 pg (ref 26.0–34.0)
MCHC: 31.6 g/dL (ref 30.0–36.0)
MCV: 90.7 fL (ref 80.0–100.0)
Monocytes Absolute: 0.7 10*3/uL (ref 0.1–1.0)
Monocytes Relative: 9 %
Neutro Abs: 5.4 10*3/uL (ref 1.7–7.7)
Neutrophils Relative %: 72 %
Platelet Count: 226 10*3/uL (ref 150–400)
RBC: 3.77 MIL/uL — ABNORMAL LOW (ref 4.22–5.81)
RDW: 22.9 % — ABNORMAL HIGH (ref 11.5–15.5)
WBC Count: 7.5 10*3/uL (ref 4.0–10.5)
nRBC: 0 % (ref 0.0–0.2)

## 2023-10-29 MED ORDER — OXYCODONE HCL 5 MG PO TABS: 2.5000 mg | ORAL_TABLET | Freq: Four times a day (QID) | ORAL | 0 refills | Status: DC | PRN

## 2023-10-29 NOTE — Progress Notes (Signed)
 Nutrition follow up completed with patient and wife. Recently maintained on Xeloda  for metastatic gastric cancer. Overall performance status has declined and Xeloda  is on hold. Restaging CT planned.  Weight: 223 pounds June 23 228 pounds 8 oz May 15 247 pounds 8 oz March 21  10% weight loss in less than 3 months which is clinically significant.  Labs include Glucose 118.  Denies nausea and vomiting. Reports drinking between 3 and 4 cartons of Boost VHC proving 530 cal, 22 gm pro per carton. He occasionally will drink a milkshake or eat pudding. He does not like anything with chunks in it and prefers everything to have a smooth texture. States he vomits with any food or liquid with texture. Taste alterations continue to limit his oral intake. Constipation recently has improved but he still struggles with it from time to time. Finds it difficult to drink adequate water and uses drip drop to flavor it. 1+ edema noted. Increased back pain contributes to decreased oral intake.  Estimated Nutrition Needs: 2400-2600 kcal, 120-140 gm protein, >2.4 L fluid  Nutrition Diagnosis: Unintended weight loss ongoing. Severe Malnutrition related to cancer as evidenced by % weight loss, loss of muscle and fat stores and edema.  Intervention: Encouraged to increase variety of Boost VHC and make shakes or smoothies using tolerated ingredients. Try to drink a minimum of 4 daily. Ensure blenderized liquids to avoid textures aggravating vomiting. Recommended clear liquid protein powder to boost overall protein and calorie intake. (Example: Oath clear protein 100 cal and 20 gm pro per scoop) Provided recipes. Encourage bowel regimen and pain management.  Monitoring, Evaluation, Goals: Increase calories and protein to minimize weight loss.  Next Visit: To be scheduled with upcoming visit. Patient has RD contact information and has been encouraged to call as needed.

## 2023-10-29 NOTE — Progress Notes (Signed)
 Todd Harrison OFFICE PROGRESS NOTE   Diagnosis: Gastric cancer  INTERVAL HISTORY:   Todd Harrison returns as scheduled.  He began a cycle of Xeloda  09/24/2023.  He denies nausea/vomiting, mouth sores, diarrhea.  He continues to have problems with constipation.  He had a bowel movement last night.  He is drinking 3-4 boost nutritional supplements a day.  He has difficulty tolerating a diet due to taste and texture.  He is losing weight.  He reports upper back pain, initially right side, now left side, for the past 3 to 4 weeks.  Partial improvement with tramadol .  He feels some numbness and weakness at the left lower outer arm.  Objective:  Vital signs in last 24 hours:  Blood pressure 110/75, pulse 92, temperature 98.1 F (36.7 C), temperature source Temporal, resp. rate 18, height 6' (1.829 m), weight 223 lb (101.2 kg), SpO2 98%.    HEENT: No thrush or ulcers. Resp: Lungs clear bilaterally. Cardio: Regular rate and rhythm. GI: Firm fullness right abdomen. Vascular: 1+ edema, mild erythema right lower leg.   Neuro: Upper extremity strength is intact. Musculoskeletal: Tender upper edge of the left scapula. Skin: Palms without erythema. Port-A-Cath without erythema.  Lab Results:  Lab Results  Component Value Date   WBC 7.5 10/29/2023   HGB 10.8 (L) 10/29/2023   HCT 34.2 (L) 10/29/2023   MCV 90.7 10/29/2023   PLT 226 10/29/2023   NEUTROABS 5.4 10/29/2023    Imaging:  No results found.  Medications: I have reviewed the patient's current medications.  Assessment/Plan: Gastric cancer with CT evidence of abdominal carcinomatosis CT abdomen/pelvis 04/13/2022-ascites, diffuse omental and peritoneal nodularity, possible circumferential mass in the transverse colon, new small left greater than right pleural effusions CT abdominal mass biopsy 04/24/2022-metastatic poorly differentiated adenocarcinoma with very focal signet ring cell features CT paracentesis  04/24/2022-no malignant cells identified Colonoscopy 04/25/2022-diverticulosis in the sigmoid colon.  Internal hemorrhoids.  No specimens collected. Upper endoscopy 04/25/2022-large infiltrative mass with oozing bleeding and stigmata of recent bleeding in the gastric fundus, on the anterior wall of the gastric body and on the greater curvature of the gastric body; deformity in the gastric antrum.  Extrinsic deformity in the entire duodenum.  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 Her2 by IHC negative, 1+; MMR IHC normal; PD-L1 CPS 1%, positive. Claudin 18 positive Cycle 1 FOLFOX 05/04/2022 Cycle 2 FOLFOX 05/17/2022 Cycle 3 FOLFOX 05/31/2022-oxaliplatin  dose reduced secondary to prolonged cold sensitivity Cycle 4 FOLFOX 06/14/2022 Cycle 5 FOLFOX 06/28/2022 CT abdomen/pelvis 07/08/2022-asymmetric wall thickening at the lesser curvature of the stomach, decreased ascites, increased diffuse omental and peritoneal nodularity Cycle 6 FOLFOX 07/12/2022 Cycle 7 FOLFOX 08/09/2022 Cycle 8 FOLFOX 08/23/2022 Cycle 9 FOLFOX 09/06/2022 CTs 09/11/2022-stable, no new or progressive interval findings Cycle 10 FOLFOX 09/20/2022 Xeloda  maintenance 2 weeks on/1 week off 10/16/2022 Cycle 2 Xeloda  11/06/2022-Xeloda  dose reduced secondary to early foot skin toxicity Cycle 3 Xeloda  11/27/2022 Cycle 4 Xeloda  12/18/2022 CTs 01/02/2023-wall thickening of the mid gastric body-possibly progressive, unchanged peritoneal/omental caking, unchanged L1 compression fracture Cycle 5 Xeloda  beginning 01/08/2023 Cycle 6 Xeloda  beginning 01/29/2023 Cycle 7 Xeloda  beginning 02/19/2023 Cycle 8 Xeloda  beginning 03/12/2023 Cycle 9 Xeloda  beginning 04/09/2023 Cycle 10 Xeloda  beginning 05/07/2023 CTs 05/21/2023: Unchanged wall thickening of the gastric body, slight increase in small volume ascites, diffuse peritoneal thickening and stranding of the omentum, no evidence of metastatic disease to the chest Cycle 11  Xeloda  beginning 05/28/2023 Cycle 12 Xeloda  beginning 06/18/2023 Cycle 13 Xeloda   beginning 07/09/2023 Cycle 14 Xeloda  beginning 07/30/2023 CTA abdomen/pelvis 08/14/2023: Large volume of heterogenous material in the gastric lumen-food/blood, small volume ascites with diffuse peritoneal thickening and matting of the bowel/omentum, enhancement of the gastric body 08/18/2023: EGD-large mass in the gastric fundus/body with no bleeding, clean-based ulcers overlying 1 area of the mass, normal duodenum Cycle 15 Xeloda  09/03/2023 Cycle 16 Xeloda  09/24/2023 Truncal rash-status post recent punch biopsy with pathology pending; biopsy reported negative per family 04/26/2022 Lichen planus of the feet Hypothyroidism Atrial fibrillation Admission 07/24/2022 with acute bilateral pulmonary embolism/right heart strain Heparin  anticoagulation 07/24/2022 Doppler 07/24/2022-acute DVT of the right femoral, popliteal, posterior tibial, and peroneal veins, acute left posterior tibial DVT Lovenox  anticoagulation, changed to once daily dosing 05/25/2023 Lovenox  changed to 40 mg daily for 2425 7.  Anemia secondary to chemotherapy, phlebotomy, and hemoptysis-improved 8.  L1 compression fracture on plain x-ray 12/18/2022 MRI lumbar spine 02/02/2023-acute/subacute L1 compression fracture, mild degenerative change of the lumbar spine 9.  Admission 08/14/2023 with hematemesis  Disposition: Todd Harrison has metastatic gastric cancer.  He has most recently been maintained on Xeloda .  He presents today for routine follow-up.  He has had an overall decline in his performance status and is having increased pain at the left scapula.  We are placing Xeloda  on hold.  We referred him for restaging CT scans.  Preliminary discussion regarding treatment with FOLFOX plus zolbetuximab.  He will return for follow-up on 11/02/2023 to review scans and have further discussion regarding treatment options.  Patient seen with Dr. Cloretta.  Todd Harrison  ANP/GNP-BC   10/29/2023  11:49 AM  This was a shared visit with Todd Harrison.  Todd Harrison was interviewed and examined.  He has performance status has declined.  We suspect the metastatic gastric cancer is progressing on Xeloda .  He will undergo restaging CTs and return for an office visit later this week. We discussed the likely treatment recommendation to resume FOLFOX and add zolbetuximab.  I was present for greater than 50% of today's visit.  I performed medical decision making.  Todd Cloretta, MD

## 2023-10-30 ENCOUNTER — Encounter: Payer: Self-pay | Admitting: Oncology

## 2023-10-30 ENCOUNTER — Other Ambulatory Visit (HOSPITAL_COMMUNITY): Payer: Self-pay

## 2023-10-30 ENCOUNTER — Other Ambulatory Visit: Payer: Self-pay

## 2023-10-31 ENCOUNTER — Inpatient Hospital Stay

## 2023-10-31 ENCOUNTER — Ambulatory Visit (HOSPITAL_BASED_OUTPATIENT_CLINIC_OR_DEPARTMENT_OTHER)
Admission: RE | Admit: 2023-10-31 | Discharge: 2023-10-31 | Disposition: A | Discharge status: Home / Self Care | Source: Ambulatory Visit | Attending: Nurse Practitioner | Admitting: Nurse Practitioner

## 2023-10-31 ENCOUNTER — Other Ambulatory Visit: Payer: Self-pay

## 2023-10-31 DIAGNOSIS — C168 Malignant neoplasm of overlapping sites of stomach: Secondary | ICD-10-CM | POA: Diagnosis present

## 2023-10-31 MED ORDER — IOHEXOL 300 MG/ML  SOLN
100.0000 mL | Freq: Once | INTRAMUSCULAR | Status: AC | PRN
  Administered 2023-10-31: 100 mL via INTRAVENOUS

## 2023-11-01 ENCOUNTER — Other Ambulatory Visit: Payer: Self-pay

## 2023-11-02 ENCOUNTER — Inpatient Hospital Stay

## 2023-11-02 ENCOUNTER — Telehealth: Payer: Self-pay

## 2023-11-02 ENCOUNTER — Encounter: Payer: Self-pay | Admitting: Nurse Practitioner

## 2023-11-02 ENCOUNTER — Ambulatory Visit (HOSPITAL_BASED_OUTPATIENT_CLINIC_OR_DEPARTMENT_OTHER)
Admission: RE | Admit: 2023-11-02 | Discharge: 2023-11-02 | Disposition: A | Discharge status: Home / Self Care | Source: Ambulatory Visit | Attending: Nurse Practitioner | Admitting: Nurse Practitioner

## 2023-11-02 ENCOUNTER — Other Ambulatory Visit: Payer: Self-pay | Admitting: Oncology

## 2023-11-02 ENCOUNTER — Encounter: Payer: Self-pay | Admitting: Oncology

## 2023-11-02 ENCOUNTER — Inpatient Hospital Stay: Admitting: Nurse Practitioner

## 2023-11-02 VITALS — BP 115/77 | HR 100 | Temp 97.8°F | Resp 18 | Ht 72.0 in | Wt 219.4 lb

## 2023-11-02 VITALS — BP 117/70 | HR 83 | Resp 18

## 2023-11-02 DIAGNOSIS — C168 Malignant neoplasm of overlapping sites of stomach: Secondary | ICD-10-CM

## 2023-11-02 DIAGNOSIS — Z95828 Presence of other vascular implants and grafts: Secondary | ICD-10-CM

## 2023-11-02 DIAGNOSIS — C169 Malignant neoplasm of stomach, unspecified: Secondary | ICD-10-CM

## 2023-11-02 DIAGNOSIS — R197 Diarrhea, unspecified: Secondary | ICD-10-CM | POA: Diagnosis not present

## 2023-11-02 LAB — CBC WITH DIFFERENTIAL (CANCER CENTER ONLY)
Abs Immature Granulocytes: 0.02 10*3/uL (ref 0.00–0.07)
Basophils Absolute: 0.1 10*3/uL (ref 0.0–0.1)
Basophils Relative: 1 %
Eosinophils Absolute: 0.1 10*3/uL (ref 0.0–0.5)
Eosinophils Relative: 2 %
HCT: 34.4 % — ABNORMAL LOW (ref 39.0–52.0)
Hemoglobin: 11.2 g/dL — ABNORMAL LOW (ref 13.0–17.0)
Immature Granulocytes: 0 %
Lymphocytes Relative: 16 %
Lymphs Abs: 1.5 10*3/uL (ref 0.7–4.0)
MCH: 29.3 pg (ref 26.0–34.0)
MCHC: 32.6 g/dL (ref 30.0–36.0)
MCV: 90.1 fL (ref 80.0–100.0)
Monocytes Absolute: 0.9 10*3/uL (ref 0.1–1.0)
Monocytes Relative: 9 %
Neutro Abs: 6.5 10*3/uL (ref 1.7–7.7)
Neutrophils Relative %: 72 %
Platelet Count: 295 10*3/uL (ref 150–400)
RBC: 3.82 MIL/uL — ABNORMAL LOW (ref 4.22–5.81)
RDW: 24.3 % — ABNORMAL HIGH (ref 11.5–15.5)
WBC Count: 9 10*3/uL (ref 4.0–10.5)
nRBC: 0 % (ref 0.0–0.2)

## 2023-11-02 LAB — CMP (CANCER CENTER ONLY)
ALT: 66 U/L — ABNORMAL HIGH (ref 0–44)
AST: 52 U/L — ABNORMAL HIGH (ref 15–41)
Albumin: 3.8 g/dL (ref 3.5–5.0)
Alkaline Phosphatase: 719 U/L — ABNORMAL HIGH (ref 38–126)
Anion gap: 14 (ref 5–15)
BUN: 24 mg/dL — ABNORMAL HIGH (ref 8–23)
CO2: 21 mmol/L — ABNORMAL LOW (ref 22–32)
Calcium: 10.1 mg/dL (ref 8.9–10.3)
Chloride: 103 mmol/L (ref 98–111)
Creatinine: 1.11 mg/dL (ref 0.61–1.24)
GFR, Estimated: >60 mL/min (ref >60)
Glucose, Bld: 106 mg/dL — ABNORMAL HIGH (ref 70–99)
Potassium: 4.5 mmol/L (ref 3.5–5.1)
Sodium: 139 mmol/L (ref 135–145)
Total Bilirubin: 1.5 mg/dL — ABNORMAL HIGH (ref 0.0–1.2)
Total Protein: 7.5 g/dL (ref 6.5–8.1)

## 2023-11-02 LAB — CLOSTRIDIUM DIFFICILE BY PCR, REFLEXED: Toxigenic C. Difficile by PCR: POSITIVE — AB

## 2023-11-02 LAB — C DIFFICILE QUICK SCREEN W PCR REFLEX
C Diff antigen: POSITIVE — AB
C Diff toxin: NEGATIVE

## 2023-11-02 LAB — SAMPLE TO BLOOD BANK

## 2023-11-02 LAB — CEA (ACCESS): CEA (CHCC): 2.23 ng/mL (ref 0.00–5.00)

## 2023-11-02 MED ORDER — HEPARIN SOD (PORK) LOCK FLUSH 100 UNIT/ML IV SOLN
500.0000 [IU] | Freq: Once | INTRAVENOUS | Status: AC
  Administered 2023-11-02: 500 [IU] via INTRAVENOUS

## 2023-11-02 MED ORDER — SODIUM CHLORIDE 0.9 % IV SOLN: INTRAVENOUS | Status: AC

## 2023-11-02 MED ORDER — SODIUM CHLORIDE 0.9% FLUSH
10.0000 mL | Freq: Once | INTRAVENOUS | Status: AC
  Administered 2023-11-02: 10 mL via INTRAVENOUS

## 2023-11-02 MED ORDER — VANCOMYCIN HCL 125 MG PO CAPS: 125.0000 mg | ORAL_CAPSULE | Freq: Four times a day (QID) | ORAL | 0 refills | Status: DC

## 2023-11-02 NOTE — Telephone Encounter (Signed)
 The patient contacted the office to notify that they will be unable to attend their appointment. The patient reports experiencing a restless night accompanied by diarrhea, with stool appearing similar to chocolate milk. The patient was advised to come in for evaluation.

## 2023-11-02 NOTE — Progress Notes (Signed)
 DISCONTINUE ON PATHWAY REGIMEN - Gastroesophageal     A cycle is every 14 days:     Oxaliplatin       Leucovorin       Fluorouracil       Fluorouracil    **Always confirm dose/schedule in your pharmacy ordering system**  PRIOR TREATMENT: GEOS3: mFOLFOX6 q14 Days Until Progression or Unacceptable Toxicity  START OFF PATHWAY REGIMEN - Gastroesophageal   [id number] + Zolbetuximab 800/400 mg/m2 IV D1 q14 Days x 12 Cycles Followed by Fluorouracil  IV D1/CIV D1,2 + Leucovorin  IV D1 + Zolbetuximab 400 mg/m2 IV D1 q14 Days:   Cycles 1: A cycle is 14 days:     Zolbetuximab-clzb      Oxaliplatin       Leucovorin       Fluorouracil       Fluorouracil     Cycles 2 through 12: A cycle is every 14 days:     Zolbetuximab-clzb      Oxaliplatin       Leucovorin       Fluorouracil       Fluorouracil     Cycles 13 and beyond: A cycle is every 14 days:     Zolbetuximab-clzb      Leucovorin       Fluorouracil       Fluorouracil    **Always confirm dose/schedule in your pharmacy ordering system**  Patient Characteristics: Distant Metastases (cM1/pM1) / Locally Recurrent Disease, Adenocarcinoma - Esophageal, GE Junction, and Gastric, Second Line, MSS/pMMR or MSI Unknown Therapeutic Status: Distant Metastases (No Additional Staging) Histology: Adenocarcinoma Disease Classification: Gastric Line of Therapy: Second Line Microsatellite/Mismatch Repair Status: MSS/pMMR Intent of Therapy: Non-Curative / Palliative Intent, Discussed with Patient

## 2023-11-02 NOTE — Patient Instructions (Signed)

## 2023-11-02 NOTE — Progress Notes (Addendum)
 [facility] OFFICE PROGRESS NOTE   Diagnosis: Gastric cancer  INTERVAL HISTORY:   Todd Harrison returns as scheduled.  He had a bowel movement 4 to 5 days ago.  Yesterday he took 2 Dulcolax, 2 Colace and a suppository.  Late last night he began having loose stools.  He estimates 4-5 loose stools per hour since then.  Stool is leaking out.  He notes the color is dark brown.  No bright red blood.  No burgundy stools.  No black stools.  He took Imodium this morning.  He has intermittent nausea.  Continues to have pain at the left upper back.  Objective:  Vital signs in last 24 hours:  Blood pressure 115/77, pulse 100, temperature 97.8 F (36.6 C), temperature source Temporal, resp. rate 18, height 6' (1.829 m), weight 219 lb 6.4 oz (99.5 kg), SpO2 98%.    Resp: Lungs clear bilaterally. Cardio: Regular rate and rhythm. GI: Firm fullness right abdomen. Vascular: Trace pitting edema right lower leg. Neuro: Alert and oriented.  Port-A-Cath without erythema.  Lab Results:  Lab Results  Component Value Date   WBC 9.0 11/02/2023   HGB 11.2 (L) 11/02/2023   HCT 34.4 (L) 11/02/2023   MCV 90.1 11/02/2023   PLT 295 11/02/2023   NEUTROABS 6.5 11/02/2023    Imaging:  CT CHEST ABDOMEN PELVIS W CONTRAST Result Date: 11/01/2023 CLINICAL DATA:  Metastatic gastric cancer. Restaging. * Tracking Code: BO * EXAM: CT CHEST, ABDOMEN, AND PELVIS WITH CONTRAST TECHNIQUE: Multidetector CT imaging of the chest, abdomen and pelvis was performed following the standard protocol during bolus administration of intravenous contrast. RADIATION DOSE REDUCTION: This exam was performed according to the departmental dose-optimization program which includes automated exposure control, adjustment of the mA and/or kV according to patient size and/or use of iterative reconstruction technique. CONTRAST:  OMNIPAQUE  IOHEXOL  300 MG/ML  SOLN COMPARISON:  08/14/2023 abdominopelvic CTA. Direct comparison  to the prior staging CT of 05/21/2023. FINDINGS: CT CHEST FINDINGS Cardiovascular: A Port-A-Cath tip high right atrium. Aortic atherosclerosis. Tortuous thoracic aorta. Mild cardiomegaly, without pericardial effusion. Aortic valve calcification. Lad coronary artery calcification. No central pulmonary embolism, on this non-dedicated study. Mediastinum/Nodes: No supraclavicular adenopathy. No mediastinal or hilar adenopathy. Mildly dilated esophagus with fluid level within. Lungs/Pleura: New trace left pleural fluid. No suspicious pulmonary nodule or mass. Musculoskeletal: Included within the abdomen pelvic section. CT ABDOMEN PELVIS FINDINGS Hepatobiliary: Suspect a high left hepatic lobe 8 mm hypoattenuating lesion on 48/2, present back to at least 07/08/2022. No new liver lesion. No calcified gallstone. The intrahepatic ducts are moderately dilated, including on 57/2, new. Common duct is poorly delineated but not dilated. Pancreas: No pancreatic duct dilatation or dominant mass. The fat planes in the lesser sac are ill-defined including on 62/2. This is new or increased since the prior. Spleen: Normal in size, without focal abnormality. Adrenals/Urinary Tract: Normal adrenal glands. Normal kidneys for age. No hydronephrosis. Normal urinary bladder. Stomach/Bowel: Gastric body wall thickening is moderate and relatively similar including on 59/2. No gastric outlet obstruction. Scattered colonic diverticula. Normal small bowel caliber. Vascular/Lymphatic: Aortic atherosclerosis. No abdominopelvic adenopathy. Reproductive: Normal prostate. Other: Small volume abdominal ascites, increased. Diffuse peritoneal thickening again identified. Especially when compared to the 07/08/2022 exam, decreased fat planes within the porta hepatis including on 60/2. No free intraperitoneal air. Musculoskeletal: Injection sites in the anterior pelvic wall. Osteopenia. Mild L1 compression deformity is similar. T8 heterogeneous sclerosis  and mild vertebral body height loss are new. IMPRESSION: 1. Moderate  gastric body wall thickening is similar. Ill definition of soft tissue planes in the lesser sac and porta hepatis, new or progressive. Development of intrahepatic duct dilatation. Constellation of findings are suspicious for infiltrative tumor into the lesser sac and porta hepatis causing intrahepatic duct dilatation. Correlate with bilirubin levels. Consider further evaluation with PET to further delineate the extent of presumed infiltrative tumor. 2. Increase in small volume abdominal ascites with persistent peritoneal metastasis. 3. New trace left pleural fluid. 4. New T8 heterogeneous sclerosis and vertebral body height loss. This could represent a healing fracture secondary to osteopenia or underlying osseous metastasis. 5. Incidental findings, including: Coronary artery atherosclerosis. Aortic Atherosclerosis (ICD10-I70.0). Esophageal air fluid level suggests dysmotility or gastroesophageal reflux. Electronically Signed   By: Rockey Kilts M.D.   On: 11/01/2023 16:02    Medications: I have reviewed the patient's current medications.  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  Internal hemorrhoids.  No specimens collected. Upper endoscopy 04/25/2022-large infiltrative mass with oozing bleeding and stigmata of recent bleeding in the gastric fundus, on the anterior wall of the gastric body and on the greater curvature of the gastric body; deformity in the gastric antrum.   Extrinsic deformity in the entire duodenum.  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 Her2 by IHC negative, 1+; MMR IHC normal; PD-L1 CPS 1%, positive. Claudin 18 positive, 100% Cycle 1 FOLFOX 05/04/2022 Cycle 2 FOLFOX 05/17/2022 Cycle 3 FOLFOX 05/31/2022-oxaliplatin  dose reduced secondary to prolonged cold sensitivity Cycle 4 FOLFOX 06/14/2022 Cycle 5 FOLFOX 06/28/2022 CT abdomen/pelvis 07/08/2022-asymmetric wall thickening at the lesser curvature of the stomach, decreased ascites, increased diffuse omental and peritoneal nodularity Cycle 6 FOLFOX 07/12/2022 Cycle 7 FOLFOX 08/09/2022 Cycle 8 FOLFOX 08/23/2022 Cycle 9 FOLFOX 09/06/2022 CTs 09/11/2022-stable, no new or progressive interval findings Cycle 10 FOLFOX 09/20/2022 Xeloda  maintenance 2 weeks on/1 week off 10/16/2022 Cycle 2 Xeloda  11/06/2022-Xeloda  dose reduced secondary to early foot skin toxicity Cycle 3 Xeloda  11/27/2022 Cycle 4 Xeloda  12/18/2022 CTs 01/02/2023-wall thickening of the mid gastric body-possibly progressive, unchanged peritoneal/omental caking, unchanged L1 compression fracture Cycle 5 Xeloda  beginning 01/08/2023 Cycle 6 Xeloda  beginning 01/29/2023 Cycle 7 Xeloda  beginning 02/19/2023 Cycle 8 Xeloda  beginning 03/12/2023 Cycle 9 Xeloda  beginning 04/09/2023 Cycle 10 Xeloda  beginning 05/07/2023 CTs 05/21/2023: Unchanged wall thickening of the gastric body, slight increase in small volume ascites, diffuse peritoneal thickening and stranding of the omentum, no evidence of metastatic disease to the chest Cycle 11 Xeloda  beginning 05/28/2023 Cycle 12 Xeloda  beginning 06/18/2023 Cycle 13 Xeloda  beginning 07/09/2023 Cycle 14 Xeloda  beginning 07/30/2023 CTA abdomen/pelvis 08/14/2023: Large volume of heterogenous material in the gastric lumen-food/blood, small volume ascites with diffuse peritoneal thickening and matting of the bowel/omentum, enhancement of the gastric body 08/18/2023: EGD-large mass in the  gastric fundus/body with no bleeding, clean-based ulcers overlying 1 area of the mass, normal duodenum Cycle 15 Xeloda  09/03/2023 Cycle 16 Xeloda  09/24/2023 CTs 10/31/2023-moderate gastric body wall thickening similar.  Ill-definition of soft tissue planes in the lesser sac and porta hepatis new or progressive.  Development of intrahepatic duct dilatation.  Increase in small volume abdominal ascites with persistent peritoneal metastasis.  New trace left pleural fluid.  New T8 heterogeneous sclerosis and vertebral  body height loss. Plan for FOLFOX plus zolbetuximab beginning 11/13/2023 Truncal rash-status post recent punch biopsy with pathology pending; biopsy reported negative per family 04/26/2022 Lichen planus of the feet Hypothyroidism Atrial fibrillation Admission 07/24/2022 with acute bilateral pulmonary embolism/right heart strain Heparin  anticoagulation 07/24/2022 Doppler 07/24/2022-acute DVT of the right femoral, popliteal, posterior tibial, and peroneal veins, acute left posterior tibial DVT Lovenox  anticoagulation, changed to once daily dosing 05/25/2023 Lovenox  changed to 40 mg daily for 2425 7.  Anemia secondary to chemotherapy, phlebotomy, and hemoptysis-improved 8.  L1 compression fracture on plain x-ray 12/18/2022 MRI lumbar spine 02/02/2023-acute/subacute L1 compression fracture, mild degenerative change of the lumbar spine 9.  Admission 08/14/2023 with hematemesis    Disposition: Todd Harrison has gastric cancer with carcinomatosis most recently treated with Xeloda .  CT scans completed earlier this week is concerning for disease progression.  In addition his performance status is declining.  CT results/images reviewed with Todd Harrison and his wife.  At his last visit we had preliminary discussion regarding retreatment with FOLFOX plus zolbetuximab.  We reviewed potential side effects associated with chemotherapy including bone marrow toxicity, nausea, hair loss.  We discussed the various forms  of neuropathy associated with oxaliplatin .  He understands the increased risk of an allergic reaction when retreated with oxaliplatin .  We reviewed the mouth sores, diarrhea and skin toxicities associated with 5-fluorouracil .  We discussed potential side effects associated with zolbetuximab including nausea and an allergic reaction. He was provided with printed information.  He and his wife understand goals of treatment are to improve current symptoms and potentially extend his life.  There is intrahepatic duct dilatation on the CT.  His case will be presented at the upcoming GI tumor conference to see if he is a candidate for stent placement.  Bilirubin is mildly elevated today.  He understands to contact the office with fever.  Etiology of current diarrhea is unclear.  He took a stool softener and laxative last night.  We are referring him for an abdominal x-ray to evaluate stool burden.  He would like to proceed with FOLFOX plus zolbetuximab.  He will return for follow-up and cycle 1 around 11/13/2023.  We are available to see him sooner if needed.  Patient seen with Dr. Cloretta.   Olam Ned ANP/GNP-BC   11/02/2023  12:33 PM  Addendum 2:30 PM-x-ray shows mild stool burden.  He continues to have diarrhea.  He agrees to a liter of IV fluids.  Check stool for C. difficile.  This was a shared visit with Olam Ned.  We discussed the restaging CT findings and reviewed the images with Todd Harrison and his wife.  There is clinical and radiologic evidence of disease progression.  He has evidence of progressive carcinomatosis, potentially causing early biliary obstruction.  It will be difficult to follow disease activity with the lack of visceral organ tumor involvement.  We discussed treatment options with Todd Harrison again today.  He would like to proceed with salvage systemic therapy.  I recommend FOLFOX/zolbetuximab.  We reviewed potential toxicities associated with this regimen including the  chance of logic toxicity, infection, and bleeding.  We discussed the potential for aggressive neuropathy and an allergic reaction with additional oxaliplatin .  We reviewed the allergic reaction and nausea associated with zolbetuximab.  He agrees to proceed.  A treatment plan was entered today.  The etiology of the diarrhea today is unclear.  He reports small-volume diarrhea beginning after taking laxatives last night.  We have a low clinical suspicion for  an infection.  I was present for greater than 50% of today's visit.  I performed medical decision making.  Arvella Hof, MD  Addendum 4:53 PM-he has completed a liter of normal saline.  He is feeling better.  He has had 2 bowel movements over the past 2 hours.  The second bowel movement was semisolid.  He will continue to hydrate at home.  We will follow-up on the C. difficile test result.  He understands to proceed to the emergency department over the weekend with poorly controlled diarrhea.

## 2023-11-05 ENCOUNTER — Encounter: Payer: Self-pay | Admitting: Oncology

## 2023-11-05 ENCOUNTER — Telehealth: Payer: Self-pay | Admitting: Nurse Practitioner

## 2023-11-05 ENCOUNTER — Telehealth: Payer: Self-pay

## 2023-11-05 NOTE — Telephone Encounter (Signed)
-----   Message from Olam Ned sent at 11/05/2023  8:13 AM EDT ----- Please call him---How is diarrhea? Fluid intake? Did he start vancomycin?

## 2023-11-05 NOTE — Telephone Encounter (Signed)
 Late entry-I contacted [name] at home 11/02/2023 10:36 PM to make him aware of the positive C. difficile test.  A prescription for vancomycin was sent to a 24-hour pharmacy.  We contacted him earlier today for follow-up.  See phone note today at 11:08 AM.

## 2023-11-05 NOTE — Telephone Encounter (Signed)
 The patient reports that he is feeling well with no further episodes of diarrhea. He is maintaining adequate fluid intake and has commenced his vancomycin therapy.

## 2023-11-06 ENCOUNTER — Other Ambulatory Visit: Payer: Self-pay

## 2023-11-06 ENCOUNTER — Other Ambulatory Visit (HOSPITAL_COMMUNITY): Payer: Self-pay

## 2023-11-06 ENCOUNTER — Telehealth: Payer: Self-pay

## 2023-11-06 NOTE — Progress Notes (Signed)
 Therapy was changed to IV chemotherapy due to disease progression.  Disenrolling from [facility] at this time.

## 2023-11-06 NOTE — Telephone Encounter (Signed)
 Telephone call placed to patient regarding upcoming appointments next week. Provided detailed information about appointment times and treatment expectations. Also reviewed the current visitor policy. Patient was very agreeable with the plan and verbalized understanding.

## 2023-11-07 ENCOUNTER — Other Ambulatory Visit: Payer: Self-pay

## 2023-11-08 ENCOUNTER — Encounter: Payer: Self-pay | Admitting: Sports Medicine

## 2023-11-08 ENCOUNTER — Ambulatory Visit: Admitting: Sports Medicine

## 2023-11-08 VITALS — BP 91/59 | Ht 72.0 in | Wt 219.0 lb

## 2023-11-08 DIAGNOSIS — M5010 Cervical disc disorder with radiculopathy, unspecified cervical region: Secondary | ICD-10-CM

## 2023-11-08 MED ORDER — GABAPENTIN 300 MG PO CAPS: 300.0000 mg | ORAL_CAPSULE | Freq: Every day | ORAL | 2 refills | Status: AC

## 2023-11-08 NOTE — Assessment & Plan Note (Signed)
 Considering his metastatic disease he is not an ideal candidate for aggressive treatment of his neck  However the radiculopathy is causing significant limitations to his activities of daily living and his ability to sleep The weakness is also keeping him from being able to play music as his left hand is not strong enough  In spite of his significant issues and medications I think is worth trying nighttime gabapentin at 300 mg I warned him about side effects I am hoping he will get some relief with this I would like to see him back once he has tried this for a month He is to call in the medication if he gets significant side effects or seems to make him unstable where he will fall

## 2023-11-08 NOTE — Progress Notes (Signed)
 Chief complaint: Pain radiating from the neck to the left shoulder and hand with weakness  Patient is currently under treatment for gastric carcinomatosis with metastatic lesions to the omentum and peritoneal wall. 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  He had had some neck pain before and a right Rotator cuff tear  More recently having neck pain that radiated to his right shoulder but gradually has moved to radiating to the left shoulder and down his left arm He feels some numbness in his left forearm He feels significant weakness in his hand which makes it hard for him to compress the strings on his upright base He does not have pain with full movement of the shoulder The pain radiates down his arm and often keeps him from being able to sleep He frequently wakes up at 3 to 4 in the morning  Physical exam Patient looks chronically ill with a grayish complexion and sagging skin around his face and chest BP (!) 91/59 (BP Location: Right Arm, Patient Position: Sitting)   Ht 6' (1.829 m)   Wt 219 lb (99.3 kg)   BMI 29.70 kg/m   Head is in a forward position with a 10 degree flexion contracture of his neck Neck motion is severely limited on the lateral bending or rotation He cannot get to neutral on extension  Neurologic testing reveals patchy loss of sensation down his left forearm He has significant weakness to thumb finger pinch and to overall grip strength in his left hand  I reviewed his CT of his cervical spine from April 25 which shows significant osteoarthritic change some degenerative disc changes and areas where he could readily have foraminal narrowing without cervical canal stenosis

## 2023-11-08 NOTE — Patient Instructions (Signed)
 You have nerve pinching in your neck causing weakness and numbness in left arm  I agree with your PT - do stretches for upper back and neck I call them I and T stretches Get your shoulder blades back  For the poor sleep and nerve irritation we will try Gabapentin 300 at night Stop if any bad side effects Mood swings - too drowsy - drunk feeling  Check with me if this isn't working

## 2023-11-09 ENCOUNTER — Encounter: Payer: Self-pay | Admitting: Oncology

## 2023-11-09 NOTE — Progress Notes (Signed)
 The proposed treatment discussed in conference is for discussion purpose only and is not a binding recommendation.  The patients have not been physically examined, or presented with their treatment options.  Therefore, final treatment plans cannot be decided.

## 2023-11-11 ENCOUNTER — Other Ambulatory Visit: Payer: Self-pay | Admitting: Oncology

## 2023-11-11 DIAGNOSIS — C169 Malignant neoplasm of stomach, unspecified: Secondary | ICD-10-CM

## 2023-11-12 ENCOUNTER — Encounter: Payer: Self-pay | Admitting: Oncology

## 2023-11-14 ENCOUNTER — Inpatient Hospital Stay

## 2023-11-14 ENCOUNTER — Other Ambulatory Visit (HOSPITAL_BASED_OUTPATIENT_CLINIC_OR_DEPARTMENT_OTHER): Payer: Self-pay

## 2023-11-14 ENCOUNTER — Encounter: Payer: Self-pay | Admitting: Nurse Practitioner

## 2023-11-14 ENCOUNTER — Other Ambulatory Visit: Payer: Self-pay | Admitting: Nurse Practitioner

## 2023-11-14 ENCOUNTER — Other Ambulatory Visit: Payer: Self-pay

## 2023-11-14 ENCOUNTER — Inpatient Hospital Stay: Attending: Nurse Practitioner | Admitting: Nurse Practitioner

## 2023-11-14 VITALS — BP 106/72 | HR 74 | Temp 98.0°F | Resp 18

## 2023-11-14 VITALS — BP 98/70 | HR 86 | Temp 98.1°F | Resp 18 | Ht 72.0 in | Wt 212.3 lb

## 2023-11-14 DIAGNOSIS — Z86718 Personal history of other venous thrombosis and embolism: Secondary | ICD-10-CM | POA: Insufficient documentation

## 2023-11-14 DIAGNOSIS — K5900 Constipation, unspecified: Secondary | ICD-10-CM | POA: Insufficient documentation

## 2023-11-14 DIAGNOSIS — Z86711 Personal history of pulmonary embolism: Secondary | ICD-10-CM | POA: Insufficient documentation

## 2023-11-14 DIAGNOSIS — C169 Malignant neoplasm of stomach, unspecified: Secondary | ICD-10-CM

## 2023-11-14 DIAGNOSIS — Z5111 Encounter for antineoplastic chemotherapy: Secondary | ICD-10-CM | POA: Diagnosis present

## 2023-11-14 DIAGNOSIS — L439 Lichen planus, unspecified: Secondary | ICD-10-CM | POA: Insufficient documentation

## 2023-11-14 DIAGNOSIS — R21 Rash and other nonspecific skin eruption: Secondary | ICD-10-CM | POA: Diagnosis not present

## 2023-11-14 DIAGNOSIS — C786 Secondary malignant neoplasm of retroperitoneum and peritoneum: Secondary | ICD-10-CM | POA: Insufficient documentation

## 2023-11-14 DIAGNOSIS — C168 Malignant neoplasm of overlapping sites of stomach: Secondary | ICD-10-CM

## 2023-11-14 DIAGNOSIS — D6481 Anemia due to antineoplastic chemotherapy: Secondary | ICD-10-CM | POA: Diagnosis not present

## 2023-11-14 LAB — CMP (CANCER CENTER ONLY)
ALT: 31 U/L (ref 0–44)
AST: 39 U/L (ref 15–41)
Albumin: 3.7 g/dL (ref 3.5–5.0)
Alkaline Phosphatase: 642 U/L — ABNORMAL HIGH (ref 38–126)
Anion gap: 11 (ref 5–15)
BUN: 16 mg/dL (ref 8–23)
CO2: 24 mmol/L (ref 22–32)
Calcium: 9.6 mg/dL (ref 8.9–10.3)
Chloride: 105 mmol/L (ref 98–111)
Creatinine: 0.93 mg/dL (ref 0.61–1.24)
GFR, Estimated: >60 mL/min (ref >60)
Glucose, Bld: 103 mg/dL — ABNORMAL HIGH (ref 70–99)
Potassium: 3.9 mmol/L (ref 3.5–5.1)
Sodium: 140 mmol/L (ref 135–145)
Total Bilirubin: 0.6 mg/dL (ref 0.0–1.2)
Total Protein: 6.9 g/dL (ref 6.5–8.1)

## 2023-11-14 LAB — CBC WITH DIFFERENTIAL (CANCER CENTER ONLY)
Abs Immature Granulocytes: 0.02 K/uL (ref 0.00–0.07)
Basophils Absolute: 0.1 K/uL (ref 0.0–0.1)
Basophils Relative: 1 %
Eosinophils Absolute: 0.4 K/uL (ref 0.0–0.5)
Eosinophils Relative: 4 %
HCT: 35.7 % — ABNORMAL LOW (ref 39.0–52.0)
Hemoglobin: 11 g/dL — ABNORMAL LOW (ref 13.0–17.0)
Immature Granulocytes: 0 %
Lymphocytes Relative: 18 %
Lymphs Abs: 1.5 K/uL (ref 0.7–4.0)
MCH: 27.4 pg (ref 26.0–34.0)
MCHC: 30.8 g/dL (ref 30.0–36.0)
MCV: 89 fL (ref 80.0–100.0)
Monocytes Absolute: 0.8 K/uL (ref 0.1–1.0)
Monocytes Relative: 10 %
Neutro Abs: 5.4 K/uL (ref 1.7–7.7)
Neutrophils Relative %: 67 %
Platelet Count: 256 K/uL (ref 150–400)
RBC: 4.01 MIL/uL — ABNORMAL LOW (ref 4.22–5.81)
RDW: 20.8 % — ABNORMAL HIGH (ref 11.5–15.5)
WBC Count: 8.1 K/uL (ref 4.0–10.5)
nRBC: 0 % (ref 0.0–0.2)

## 2023-11-14 LAB — CEA (ACCESS): CEA (CHCC): 2.32 ng/mL (ref 0.00–5.00)

## 2023-11-14 MED ORDER — DEXAMETHASONE SODIUM PHOSPHATE 10 MG/ML IJ SOLN
10.0000 mg | Freq: Once | INTRAMUSCULAR | Status: AC
  Administered 2023-11-14: 10 mg via INTRAVENOUS
  Filled 2023-11-14: qty 1

## 2023-11-14 MED ORDER — SODIUM CHLORIDE 0.9% FLUSH
10.0000 mL | INTRAVENOUS | Status: DC | PRN
  Administered 2023-11-14: 10 mL

## 2023-11-14 MED ORDER — OLANZAPINE 5 MG PO TABS
5.0000 mg | ORAL_TABLET | Freq: Once | ORAL | Status: AC
  Administered 2023-11-14: 5 mg via ORAL
  Filled 2023-11-14: qty 1

## 2023-11-14 MED ORDER — PROCHLORPERAZINE MALEATE 10 MG PO TABS
5.0000 mg | ORAL_TABLET | Freq: Once | ORAL | Status: AC
  Administered 2023-11-14: 5 mg via ORAL
  Filled 2023-11-14: qty 1

## 2023-11-14 MED ORDER — SODIUM CHLORIDE 0.9 % IV SOLN: INTRAVENOUS | Status: DC

## 2023-11-14 MED ORDER — DEXAMETHASONE 4 MG PO TABS
8.0000 mg | ORAL_TABLET | Freq: Every day | ORAL | 1 refills | Status: AC
  Filled 2023-11-14: qty 30, 15d supply, fill #0
  Filled 2024-01-07: qty 30, 15d supply, fill #1

## 2023-11-14 MED ORDER — HEPARIN SOD (PORK) LOCK FLUSH 100 UNIT/ML IV SOLN
500.0000 [IU] | Freq: Once | INTRAVENOUS | Status: AC | PRN
  Administered 2023-11-14: 500 [IU]

## 2023-11-14 MED ORDER — LORAZEPAM 1 MG PO TABS
0.5000 mg | ORAL_TABLET | ORAL | Status: DC | PRN
  Administered 2023-11-14: 0.5 mg via ORAL
  Filled 2023-11-14: qty 1

## 2023-11-14 MED ORDER — ZOLBETUXIMAB-CLZB CHEMO 100MG/5ML IV SOLN
400.0000 mg/m2 | Freq: Once | INTRAVENOUS | Status: DC
  Filled 2023-11-14: qty 44

## 2023-11-14 MED ORDER — OLANZAPINE 5 MG PO TABS
5.0000 mg | ORAL_TABLET | Freq: Every day | ORAL | 1 refills | Status: AC
  Filled 2023-11-14: qty 30, 30d supply, fill #0
  Filled 2024-01-07: qty 30, 30d supply, fill #1

## 2023-11-14 MED ORDER — APREPITANT 130 MG/18ML IV EMUL
130.0000 mg | Freq: Once | INTRAVENOUS | Status: AC
  Administered 2023-11-14: 130 mg via INTRAVENOUS
  Filled 2023-11-14: qty 18

## 2023-11-14 MED ORDER — PALONOSETRON HCL INJECTION 0.25 MG/5ML
0.2500 mg | Freq: Once | INTRAVENOUS | Status: AC
  Administered 2023-11-14: 0.25 mg via INTRAVENOUS
  Filled 2023-11-14: qty 5

## 2023-11-14 MED ORDER — ZOLBETUXIMAB-CLZB CHEMO 100MG/5ML IV SOLN
400.0000 mg/m2 | Freq: Once | INTRAVENOUS | Status: AC
  Administered 2023-11-14: 885 mg via INTRAVENOUS
  Filled 2023-11-14: qty 44.25

## 2023-11-14 NOTE — Patient Instructions (Signed)
 [facility] - A DEPT OF [facility]  Discharge Instructions: Thank you for choosing [facility] to provide your oncology and hematology care.   If you have a lab appointment with the Cancer Center, please go directly to the Cancer Center and check in at the registration area.   Wear comfortable clothing and clothing appropriate for easy access to any Portacath or PICC line.   We strive to give you quality time with your provider. You may need to reschedule your appointment if you arrive late (15 or more minutes).  Arriving late affects you and other patients whose appointments are after yours.  Also, if you miss three or more appointments without notifying the office, you may be dismissed from the clinic at the provider's discretion.      For prescription refill requests, have your pharmacy contact our office and allow 72 hours for refills to be completed.    Today you received the following chemotherapy and/or immunotherapy agents Vyloy .  Zolbetuximab Injection What is this medication? ZOLBETUXIMAB (ZOL be TUX i mab) treats stomach cancer. It works by blocking a protein that causes cancer cells to grow and multiply. This helps to slow or stop the spread of cancer cells. It is a monoclonal antibody. This medicine may be used for other purposes; ask your health care provider or pharmacist if you have questions. COMMON BRAND NAME(S): Vyloy  What should I tell my care team before I take this medication? They need to know if you have any of these conditions: Nausea or vomiting An unusual or allergic reaction to zolbetuximab, other medications, foods, dyes, or preservatives Pregnant or trying to get pregnant Breastfeeding How should I use this medication? This medication is infused into a vein. It is given by your care team in a hospital or clinic setting. Talk to your care team about the use of this medication in children. Special care may be  needed. Overdosage: If you think you have taken too much of this medicine contact a poison control center or emergency room at once. NOTE: This medicine is only for you. Do not share this medicine with others. What if I miss a dose? Keep appointments for follow-up doses. It is important not to miss your dose. Call your care team if you are unable to keep an appointment. What may interact with this medication? Interactions have not been studied. This list may not describe all possible interactions. Give your health care provider a list of all the medicines, herbs, non-prescription drugs, or dietary supplements you use. Also tell them if you smoke, drink alcohol , or use illegal drugs. Some items may interact with your medicine. What should I watch for while using this medication? Your condition will be monitored carefully while you are receiving this medication. You may need blood work done while you are taking this medication. This medication can cause serious infusion reactions. To reduce the risk, your care team may give you other medications to take before receiving this one. Follow the directions from your care team. Do not breastfeed while taking this medication and for 8 months after the last dose. What side effects may I notice from receiving this medication? Side effects that you should report to your care team as soon as possible: Allergic reactions--skin rash, itching, hives, swelling of the face, lips, tongue, or throat Infusion reactions--chest pain, shortness of breath or trouble breathing, feeling faint or lightheaded Side effects that usually do not require medical attention (report these  to your care team if they continue or are bothersome): Diarrhea Fatigue Loss of appetite with weight loss Nausea Pain, tingling, or numbness in the hands or feet Swelling of the ankles, hands, or feet Vomiting This list may not describe all possible side effects. Call your doctor for medical  advice about side effects. You may report side effects to FDA at 1-800-FDA-1088. Where should I keep my medication? This medication is given in a hospital or clinic. It will not be stored at home. NOTE: This sheet is a summary. It may not cover all possible information. If you have questions about this medicine, talk to your doctor, pharmacist, or health care provider.  2025 Elsevier/Gold Standard (2023-03-28 00:00:00)   To help prevent nausea and vomiting after your treatment, we encourage you to take your nausea medication as directed.  BELOW ARE SYMPTOMS THAT SHOULD BE REPORTED IMMEDIATELY: *FEVER GREATER THAN 100.4 F (38 C) OR HIGHER *CHILLS OR SWEATING *NAUSEA AND VOMITING THAT IS NOT CONTROLLED WITH YOUR NAUSEA MEDICATION *UNUSUAL SHORTNESS OF BREATH *UNUSUAL BRUISING OR BLEEDING *URINARY PROBLEMS (pain or burning when urinating, or frequent urination) *BOWEL PROBLEMS (unusual diarrhea, constipation, pain near the anus) TENDERNESS IN MOUTH AND THROAT WITH OR WITHOUT PRESENCE OF ULCERS (sore throat, sores in mouth, or a toothache) UNUSUAL RASH, SWELLING OR PAIN  UNUSUAL VAGINAL DISCHARGE OR ITCHING   Items with * indicate a potential emergency and should be followed up as soon as possible or go to the Emergency Department if any problems should occur.  Please show the CHEMOTHERAPY ALERT CARD or IMMUNOTHERAPY ALERT CARD at check-in to the Emergency Department and triage nurse.  Should you have questions after your visit or need to cancel or reschedule your appointment, please contact [facility] - A DEPT OF [facility]  Dept: [phone]  and follow the prompts.  Office hours are 8:00 a.m. to 4:30 p.m. Monday - Friday. Please note that voicemails left after 4:00 p.m. may not be returned until the following business day.  We are closed weekends and major holidays. You have access to a nurse at all times for urgent questions. Please call the main number  to the clinic Dept: [phone] and follow the prompts.   For any non-urgent questions, you may also contact your provider using MyChart. We now offer e-Visits for anyone 74 and older to request care online for non-urgent symptoms. For details visit [email or web address].   Also download the MyChart app! Go to the app store, search MyChart, open the app, select [place], and log in with your MyChart username and password.

## 2023-11-14 NOTE — Progress Notes (Signed)
 [facility] OFFICE PROGRESS NOTE   Diagnosis:  Gastric cancer  INTERVAL HISTORY:   Todd Harrison returns as scheduled.  Stool test for C. difficile by PCR returned positive on 11/02/2023.  He has completed a course of vancomycin .  He is seen today prior to beginning treatment with FOLFOX plus zolbetuximab.  Plan is to complete the zolbetuximab today, FOLFOX tomorrow.  He has intermittent nausea.  No further diarrhea.  He denies bleeding.  He reports a sore at the upper gluteal fold.  Objective:  Vital signs in last 24 hours:  Blood pressure 98/70, pulse 86, temperature 98.1 F (36.7 C), temperature source Temporal, resp. rate 18, height 6' (1.829 m), weight 212 lb 4.8 oz (96.3 kg), SpO2 100%.    HEENT: No thrush or ulcers.  Mucous membranes appear moist. Resp: Lungs clear bilaterally. Cardio: Regular rate and rhythm. GI: Firm fullness right to mid abdomen. Vascular: Trace pitting edema right lower leg. Neuro: Alert and oriented. Skin: Minimal skin breakdown right upper medial buttock.  No open ulceration. Port-A-Cath without erythema.    Lab Results:  Lab Results  Component Value Date   WBC 8.1 11/14/2023   HGB 11.0 (L) 11/14/2023   HCT 35.7 (L) 11/14/2023   MCV 89.0 11/14/2023   PLT 256 11/14/2023   NEUTROABS 5.4 11/14/2023    Imaging:  No results found.  Medications: I have reviewed the patient's current medications.  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  Internal hemorrhoids.  No  specimens collected. Upper endoscopy 04/25/2022-large infiltrative mass with oozing bleeding and stigmata of recent bleeding in the gastric fundus, on the anterior wall of the gastric body and on the greater curvature of the gastric body; deformity in the gastric antrum.  Extrinsic deformity in the entire duodenum.  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 Her2 by IHC negative, 1+; MMR IHC normal; PD-L1 CPS 1%, positive. Claudin 18 positive, 100% Cycle 1 FOLFOX 05/04/2022 Cycle 2 FOLFOX 05/17/2022 Cycle 3 FOLFOX 05/31/2022-oxaliplatin  dose reduced secondary to prolonged cold sensitivity Cycle 4 FOLFOX 06/14/2022 Cycle 5 FOLFOX 06/28/2022 CT abdomen/pelvis 07/08/2022-asymmetric wall thickening at the lesser curvature of the stomach, decreased ascites, increased diffuse omental and peritoneal nodularity Cycle 6 FOLFOX 07/12/2022 Cycle 7 FOLFOX 08/09/2022 Cycle 8 FOLFOX 08/23/2022 Cycle 9 FOLFOX 09/06/2022 CTs 09/11/2022-stable, no new or progressive interval findings Cycle 10 FOLFOX 09/20/2022 Xeloda  maintenance 2 weeks on/1 week off 10/16/2022 Cycle 2 Xeloda  11/06/2022-Xeloda  dose reduced secondary to early foot skin toxicity Cycle 3 Xeloda  11/27/2022 Cycle 4 Xeloda  12/18/2022 CTs 01/02/2023-wall thickening of the mid gastric body-possibly progressive, unchanged peritoneal/omental caking, unchanged L1 compression fracture Cycle 5 Xeloda  beginning 01/08/2023 Cycle 6 Xeloda  beginning 01/29/2023 Cycle 7 Xeloda  beginning 02/19/2023 Cycle 8 Xeloda  beginning 03/12/2023 Cycle 9 Xeloda  beginning 04/09/2023 Cycle 10 Xeloda  beginning 05/07/2023 CTs 05/21/2023: Unchanged wall thickening of the gastric body, slight increase in small volume ascites, diffuse peritoneal thickening and stranding of the omentum, no evidence of metastatic disease to the chest Cycle 11 Xeloda  beginning 05/28/2023 Cycle 12 Xeloda  beginning 06/18/2023 Cycle 13 Xeloda  beginning 07/09/2023 Cycle 14 Xeloda   beginning 07/30/2023 CTA abdomen/pelvis 08/14/2023: Large volume of heterogenous material in the gastric lumen-food/blood, small volume  ascites with diffuse peritoneal thickening and matting of the bowel/omentum, enhancement of the gastric body 08/18/2023: EGD-large mass in the gastric fundus/body with no bleeding, clean-based ulcers overlying 1 area of the mass, normal duodenum Cycle 15 Xeloda  09/03/2023 Cycle 16 Xeloda  09/24/2023 CTs 10/31/2023-moderate gastric body wall thickening similar.  Ill-definition of soft tissue planes in the lesser sac and porta hepatis new or progressive.  Development of intrahepatic duct dilatation.  Increase in small volume abdominal ascites with persistent peritoneal metastasis.  New trace left pleural fluid.  New T8 heterogeneous sclerosis and vertebral body height loss. FOLFOX plus zolbetuximab beginning 11/13/2023 (zolbetuximab 11/14/2023, FOLFOX 11/15/2023) Truncal rash-status post recent punch biopsy with pathology pending; biopsy reported negative per family 04/26/2022 Lichen planus of the feet Hypothyroidism Atrial fibrillation Admission 07/24/2022 with acute bilateral pulmonary embolism/right heart strain Heparin  anticoagulation 07/24/2022 Doppler 07/24/2022-acute DVT of the right femoral, popliteal, posterior tibial, and peroneal veins, acute left posterior tibial DVT Lovenox  anticoagulation, changed to once daily dosing 05/25/2023 Lovenox  changed to 40 mg daily for 2425 7.  Anemia secondary to chemotherapy, phlebotomy, and hemoptysis-improved 8.  L1 compression fracture on plain x-ray 12/18/2022 MRI lumbar spine 02/02/2023-acute/subacute L1 compression fracture, mild degenerative change of the lumbar spine 9.  Admission 08/14/2023 with hematemesis 10. C. Difficile positive by PCR-completed course of Vancomycin   Disposition: Todd Harrison appears stable.  He is scheduled to begin treatment with FOLFOX plus zolbetuximab today.  The plan is for zolbetuximab today, FOLFOX  tomorrow.  We again reviewed potential toxicities.  He agrees to proceed.  We reviewed the instructions for Zyprexa  and Decadron  to begin tomorrow.  We discussed the drowsiness and orthostatic hypotension associated with Zyprexa .  CBC and chemistry panel reviewed.  Labs are adequate for treatment.  He will return for follow-up and cycle 2 in 2 weeks.  We are available to see him sooner if needed.    Olam Ned ANP/GNP-BC   11/14/2023  8:57 AM

## 2023-11-14 NOTE — Progress Notes (Signed)
 Patient seen by Olam Ned NP today  Vitals are within treatment parameters:Yes   Labs are within treatment parameters: Yes Alkaline Phosphatase 642  Treatment plan has been signed: Yes   Per physician team, Patient is ready for treatment and there are NO modifications to the treatment plan.

## 2023-11-14 NOTE — Progress Notes (Signed)
 Patient presents today for first time chemotherapy infusion of Vyloy . Patient is in satisfactory condition with no new complaints voiced.  Vital signs are stable.  Labs reviewed by Dr. Arley Hof during the office visit and all labs are within treatment parameters. Consent signed.  We will proceed with treatment per MD orders.   Patient started at a lower initial dose per consult with Kolleen RPH and new care-plan in place.   1049-Patient started at 35ml/hr and will be titrated at 10ml/hr q30 minutes as tolerated.   1245-infusion stopped at rate of 74mL/hr due to patient's complaint of nausea and pressure/heaviness pain in abd. VSS B/P 110/65, HR 65, Resp 18, O2 sat 99.   1247-Patient given Ativan  0.5 PO per treatment orders and will be reevaluated in 30 minutes in order to continue treatment.   1317-Patient reports slight improvement in nausea, did vomit small amount of mucosa emesis up during Ativan  wait time. Dr Hof and Olam Ned made aware. Recommended for patient to be restarted at initial stop rate of 75mL/hr and upon next titration if not tolerated consider compazine  per [facility] with Olam Ned NP approval-but patient requests to speak with Dr Hof or Olam Ned NP before starting treatment again.  1418-Patient seen by Olam Ned and agreed upon for patient to be given Compazine  5mg  PO and wait 30 minutes prior to restarting infusion.  1423- Compazine  given.  1453-Patient reports feeling better and infusion restarted.  1516-Patient tolerating well, will continue until 1615-med will expire and is to be wasted per instruction/protocol.   1715-Patient rested with eyes closed, respirations even and non-labored, no signs of distress. Spot check vitals WNL during post observation time.   Patient left via wheelchair in stable condition.  Vital signs stable at discharge.  Follow up as scheduled.

## 2023-11-15 ENCOUNTER — Ambulatory Visit

## 2023-11-15 ENCOUNTER — Inpatient Hospital Stay

## 2023-11-15 ENCOUNTER — Encounter: Payer: Self-pay | Admitting: Oncology

## 2023-11-15 VITALS — BP 100/60 | HR 71 | Temp 97.9°F | Resp 18

## 2023-11-15 DIAGNOSIS — Z5111 Encounter for antineoplastic chemotherapy: Secondary | ICD-10-CM | POA: Diagnosis not present

## 2023-11-15 DIAGNOSIS — C169 Malignant neoplasm of stomach, unspecified: Secondary | ICD-10-CM

## 2023-11-15 MED ORDER — DEXTROSE 5 % IV SOLN
INTRAVENOUS | Status: DC
Start: 2023-11-15 — End: 2023-11-15

## 2023-11-15 MED ORDER — FLUOROURACIL CHEMO INJECTION 2.5 GM/50ML
400.0000 mg/m2 | Freq: Once | INTRAVENOUS | Status: AC
  Administered 2023-11-15: 900 mg via INTRAVENOUS
  Filled 2023-11-15: qty 18

## 2023-11-15 MED ORDER — LEUCOVORIN CALCIUM INJECTION 350 MG
400.0000 mg/m2 | Freq: Once | INTRAVENOUS | Status: AC
  Administered 2023-11-15: 884 mg via INTRAVENOUS
  Filled 2023-11-15: qty 44.2

## 2023-11-15 MED ORDER — OXALIPLATIN CHEMO INJECTION 100 MG/20ML
65.0000 mg/m2 | Freq: Once | INTRAVENOUS | Status: AC
  Administered 2023-11-15: 150 mg via INTRAVENOUS
  Filled 2023-11-15: qty 20

## 2023-11-15 MED ORDER — DEXAMETHASONE SODIUM PHOSPHATE 10 MG/ML IJ SOLN
10.0000 mg | Freq: Once | INTRAMUSCULAR | Status: AC
  Administered 2023-11-15: 10 mg via INTRAVENOUS
  Filled 2023-11-15: qty 1

## 2023-11-15 MED ORDER — SODIUM CHLORIDE 0.9 % IV SOLN
2000.0000 mg/m2 | INTRAVENOUS | Status: DC
  Administered 2023-11-15: 4400 mg via INTRAVENOUS
  Filled 2023-11-15: qty 88

## 2023-11-15 NOTE — Patient Instructions (Signed)
 [facility] - A DEPT OF [facility]  Discharge Instructions: Thank you for choosing [facility] to provide your oncology and hematology care.   If you have a lab appointment with the Cancer Center, please go directly to the Cancer Center and check in at the registration area.   Wear comfortable clothing and clothing appropriate for easy access to any Portacath or PICC line.   We strive to give you quality time with your provider. You may need to reschedule your appointment if you arrive late (15 or more minutes).  Arriving late affects you and other patients whose appointments are after yours.  Also, if you miss three or more appointments without notifying the office, you may be dismissed from the clinic at the provider's discretion.      For prescription refill requests, have your pharmacy contact our office and allow 72 hours for refills to be completed.    Today you received the following chemotherapy and/or immunotherapy agents Oxaliplatin , Leucovorin , and Adrucil .  Oxaliplatin  Injection What is this medication? OXALIPLATIN  (ox AL i PLA tin) treats colorectal cancer. It works by slowing down the growth of cancer cells. This medicine may be used for other purposes; ask your health care provider or pharmacist if you have questions. COMMON BRAND NAME(S): Eloxatin  What should I tell my care team before I take this medication? They need to know if you have any of these conditions: Heart disease History of irregular heartbeat or rhythm Liver disease Low blood cell levels (white cells, red cells, and platelets) Lung or breathing disease, such as asthma Take medications that treat or prevent blood clots Tingling of the fingers, toes, or other nerve disorder An unusual or allergic reaction to oxaliplatin , other medications, foods, dyes, or preservatives If you or your partner are pregnant or trying to get pregnant Breast-feeding How should I use  this medication? This medication is injected into a vein. It is given by your care team in a hospital or clinic setting. Talk to your care team about the use of this medication in children. Special care may be needed. Overdosage: If you think you have taken too much of this medicine contact a poison control center or emergency room at once. NOTE: This medicine is only for you. Do not share this medicine with others. What if I miss a dose? Keep appointments for follow-up doses. It is important not to miss a dose. Call your care team if you are unable to keep an appointment. What may interact with this medication? Do not take this medication with any of the following: Cisapride Dronedarone Pimozide Thioridazine This medication may also interact with the following: Aspirin  and aspirin -like medications Certain medications that treat or prevent blood clots, such as warfarin, apixaban , dabigatran, and rivaroxaban Cisplatin Cyclosporine Diuretics Medications for infection, such as acyclovir, adefovir, amphotericin B, bacitracin, cidofovir, foscarnet, ganciclovir, gentamicin, pentamidine, vancomycin  NSAIDs, medications for pain and inflammation, such as ibuprofen or naproxen Other medications that cause heart rhythm changes Pamidronate Zoledronic acid This list may not describe all possible interactions. Give your health care provider a list of all the medicines, herbs, non-prescription drugs, or dietary supplements you use. Also tell them if you smoke, drink alcohol , or use illegal drugs. Some items may interact with your medicine. What should I watch for while using this medication? Your condition will be monitored carefully while you are receiving this medication. You may need blood work while taking this medication. This medication may make you  feel generally unwell. This is not uncommon as chemotherapy can affect healthy cells as well as cancer cells. Report any side effects. Continue your  course of treatment even though you feel ill unless your care team tells you to stop. This medication may increase your risk of getting an infection. Call your care team for advice if you get a fever, chills, sore throat, or other symptoms of a cold or flu. Do not treat yourself. Try to avoid being around people who are sick. Avoid taking medications that contain aspirin , acetaminophen , ibuprofen, naproxen, or ketoprofen unless instructed by your care team. These medications may hide a fever. Be careful brushing or flossing your teeth or using a toothpick because you may get an infection or bleed more easily. If you have any dental work done, tell your dentist you are receiving this medication. This medication can make you more sensitive to cold. Do not drink cold drinks or use ice. Cover exposed skin before coming in contact with cold temperatures or cold objects. When out in cold weather wear warm clothing and cover your mouth and nose to warm the air that goes into your lungs. Tell your care team if you get sensitive to the cold. Talk to your care team if you or your partner are pregnant or think either of you might be pregnant. This medication can cause serious birth defects if taken during pregnancy and for 9 months after the last dose. A negative pregnancy test is required before starting this medication. A reliable form of contraception is recommended while taking this medication and for 9 months after the last dose. Talk to your care team about effective forms of contraception. Do not father a child while taking this medication and for 6 months after the last dose. Use a condom while having sex during this time period. Do not breastfeed while taking this medication and for 3 months after the last dose. This medication may cause infertility. Talk to your care team if you are concerned about your fertility. What side effects may I notice from receiving this medication? Side effects that you should  report to your care team as soon as possible: Allergic reactions--skin rash, itching, hives, swelling of the face, lips, tongue, or throat Bleeding--bloody or black, tar-like stools, vomiting blood or brown material that looks like coffee grounds, red or dark brown urine, small red or purple spots on skin, unusual bruising or bleeding Dry cough, shortness of breath or trouble breathing Heart rhythm changes--fast or irregular heartbeat, dizziness, feeling faint or lightheaded, chest pain, trouble breathing Infection--fever, chills, cough, sore throat, wounds that don't heal, pain or trouble when passing urine, general feeling of discomfort or being unwell Liver injury--right upper belly pain, loss of appetite, nausea, light-colored stool, dark yellow or brown urine, yellowing skin or eyes, unusual weakness or fatigue Low red blood cell level--unusual weakness or fatigue, dizziness, headache, trouble breathing Muscle injury--unusual weakness or fatigue, muscle pain, dark yellow or brown urine, decrease in amount of urine Pain, tingling, or numbness in the hands or feet Sudden and severe headache, confusion, change in vision, seizures, which may be signs of posterior reversible encephalopathy syndrome (PRES) Unusual bruising or bleeding Side effects that usually do not require medical attention (report to your care team if they continue or are bothersome): Diarrhea Nausea Pain, redness, or swelling with sores inside the mouth or throat Unusual weakness or fatigue Vomiting This list may not describe all possible side effects. Call your doctor for medical advice about side  effects. You may report side effects to FDA at 1-800-FDA-1088. Where should I keep my medication? This medication is given in a hospital or clinic. It will not be stored at home. NOTE: This sheet is a summary. It may not cover all possible information. If you have questions about this medicine, talk to your doctor, pharmacist, or  health care provider.  2024 Elsevier/Gold Standard (2023-04-06 00:00:00)  Leucovorin  Injection What is this medication? LEUCOVORIN  (loo koe VOR in) prevents side effects from certain medications, such as methotrexate. It works by increasing folate levels. This helps protect healthy cells in your body. It may also be used to treat anemia caused by low levels of folate. It can also be used with fluorouracil , a type of chemotherapy, to treat colorectal cancer. It works by increasing the effects of fluorouracil  in the body. This medicine may be used for other purposes; ask your health care provider or pharmacist if you have questions. What should I tell my care team before I take this medication? They need to know if you have any of these conditions: Anemia from low levels of vitamin B12 in the blood An unusual or allergic reaction to leucovorin , folic acid , other medications, foods, dyes, or preservatives Pregnant or trying to get pregnant Breastfeeding How should I use this medication? This medication is injected into a vein or a muscle. It is given by your care team in a hospital or clinic setting. Talk to your care team about the use of this medication in children. Special care may be needed. Overdosage: If you think you have taken too much of this medicine contact a poison control center or emergency room at once. NOTE: This medicine is only for you. Do not share this medicine with others. What if I miss a dose? Keep appointments for follow-up doses. It is important not to miss your dose. Call your care team if you are unable to keep an appointment. What may interact with this medication? Capecitabine  Fluorouracil  Phenobarbital Phenytoin Primidone Trimethoprim;sulfamethoxazole This list may not describe all possible interactions. Give your health care provider a list of all the medicines, herbs, non-prescription drugs, or dietary supplements you use. Also tell them if you smoke, drink  alcohol , or use illegal drugs. Some items may interact with your medicine. What should I watch for while using this medication? Your condition will be monitored carefully while you are receiving this medication. This medication may increase the side effects of 5-fluorouracil . Tell your care team if you have diarrhea or mouth sores that do not get better or that get worse. What side effects may I notice from receiving this medication? Side effects that you should report to your care team as soon as possible: Allergic reactions--skin rash, itching, hives, swelling of the face, lips, tongue, or throat This list may not describe all possible side effects. Call your doctor for medical advice about side effects. You may report side effects to FDA at 1-800-FDA-1088. Where should I keep my medication? This medication is given in a hospital or clinic. It will not be stored at home. NOTE: This sheet is a summary. It may not cover all possible information. If you have questions about this medicine, talk to your doctor, pharmacist, or health care provider.  2024 Elsevier/Gold Standard (2021-09-27 00:00:00)  Fluorouracil  Injection What is this medication? FLUOROURACIL  (flure oh YOOR a sil) treats some types of cancer. It works by slowing down the growth of cancer cells. This medicine may be used for other purposes; ask your health  care provider or pharmacist if you have questions. COMMON BRAND NAME(S): Adrucil  What should I tell my care team before I take this medication? They need to know if you have any of these conditions: Blood disorders Dihydropyrimidine dehydrogenase (DPD) deficiency Infection, such as chickenpox, cold sores, herpes Kidney disease Liver disease Poor nutrition Recent or ongoing radiation therapy An unusual or allergic reaction to fluorouracil , other medications, foods, dyes, or preservatives If you or your partner are pregnant or trying to get pregnant Breast-feeding How  should I use this medication? This medication is injected into a vein. It is administered by your care team in a hospital or clinic setting. Talk to your care team about the use of this medication in children. Special care may be needed. Overdosage: If you think you have taken too much of this medicine contact a poison control center or emergency room at once. NOTE: This medicine is only for you. Do not share this medicine with others. What if I miss a dose? Keep appointments for follow-up doses. It is important not to miss your dose. Call your care team if you are unable to keep an appointment. What may interact with this medication? Do not take this medication with any of the following: Live virus vaccines This medication may also interact with the following: Medications that treat or prevent blood clots, such as warfarin, enoxaparin , dalteparin This list may not describe all possible interactions. Give your health care provider a list of all the medicines, herbs, non-prescription drugs, or dietary supplements you use. Also tell them if you smoke, drink alcohol , or use illegal drugs. Some items may interact with your medicine. What should I watch for while using this medication? Your condition will be monitored carefully while you are receiving this medication. This medication may make you feel generally unwell. This is not uncommon as chemotherapy can affect healthy cells as well as cancer cells. Report any side effects. Continue your course of treatment even though you feel ill unless your care team tells you to stop. In some cases, you may be given additional medications to help with side effects. Follow all directions for their use. This medication may increase your risk of getting an infection. Call your care team for advice if you get a fever, chills, sore throat, or other symptoms of a cold or flu. Do not treat yourself. Try to avoid being around people who are sick. This medication may  increase your risk to bruise or bleed. Call your care team if you notice any unusual bleeding. Be careful brushing or flossing your teeth or using a toothpick because you may get an infection or bleed more easily. If you have any dental work done, tell your dentist you are receiving this medication. Avoid taking medications that contain aspirin , acetaminophen , ibuprofen, naproxen, or ketoprofen unless instructed by your care team. These medications may hide a fever. Do not treat diarrhea with over the counter products. Contact your care team if you have diarrhea that lasts more than 2 days or if it is severe and watery. This medication can make you more sensitive to the sun. Keep out of the sun. If you cannot avoid being in the sun, wear protective clothing and sunscreen. Do not use sun lamps, tanning beds, or tanning booths. Talk to your care team if you or your partner wish to become pregnant or think you might be pregnant. This medication can cause serious birth defects if taken during pregnancy and for 3 months after the last dose.  A reliable form of contraception is recommended while taking this medication and for 3 months after the last dose. Talk to your care team about effective forms of contraception. Do not father a child while taking this medication and for 3 months after the last dose. Use a condom while having sex during this time period. Do not breastfeed while taking this medication. This medication may cause infertility. Talk to your care team if you are concerned about your fertility. What side effects may I notice from receiving this medication? Side effects that you should report to your care team as soon as possible: Allergic reactions--skin rash, itching, hives, swelling of the face, lips, tongue, or throat Heart attack--pain or tightness in the chest, shoulders, arms, or jaw, nausea, shortness of breath, cold or clammy skin, feeling faint or lightheaded Heart failure--shortness of  breath, swelling of the ankles, feet, or hands, sudden weight gain, unusual weakness or fatigue Heart rhythm changes--fast or irregular heartbeat, dizziness, feeling faint or lightheaded, chest pain, trouble breathing High ammonia level--unusual weakness or fatigue, confusion, loss of appetite, nausea, vomiting, seizures Infection--fever, chills, cough, sore throat, wounds that don't heal, pain or trouble when passing urine, general feeling of discomfort or being unwell Low red blood cell level--unusual weakness or fatigue, dizziness, headache, trouble breathing Pain, tingling, or numbness in the hands or feet, muscle weakness, change in vision, confusion or trouble speaking, loss of balance or coordination, trouble walking, seizures Redness, swelling, and blistering of the skin over hands and feet Severe or prolonged diarrhea Unusual bruising or bleeding Side effects that usually do not require medical attention (report to your care team if they continue or are bothersome): Dry skin Headache Increased tears Nausea Pain, redness, or swelling with sores inside the mouth or throat Sensitivity to light Vomiting This list may not describe all possible side effects. Call your doctor for medical advice about side effects. You may report side effects to FDA at 1-800-FDA-1088. Where should I keep my medication? This medication is given in a hospital or clinic. It will not be stored at home. NOTE: This sheet is a summary. It may not cover all possible information. If you have questions about this medicine, talk to your doctor, pharmacist, or health care provider.  2024 Elsevier/Gold Standard (2021-08-30 00:00:00)   To help prevent nausea and vomiting after your treatment, we encourage you to take your nausea medication as directed.  BELOW ARE SYMPTOMS THAT SHOULD BE REPORTED IMMEDIATELY: *FEVER GREATER THAN 100.4 F (38 C) OR HIGHER *CHILLS OR SWEATING *NAUSEA AND VOMITING THAT IS NOT CONTROLLED  WITH YOUR NAUSEA MEDICATION *UNUSUAL SHORTNESS OF BREATH *UNUSUAL BRUISING OR BLEEDING *URINARY PROBLEMS (pain or burning when urinating, or frequent urination) *BOWEL PROBLEMS (unusual diarrhea, constipation, pain near the anus) TENDERNESS IN MOUTH AND THROAT WITH OR WITHOUT PRESENCE OF ULCERS (sore throat, sores in mouth, or a toothache) UNUSUAL RASH, SWELLING OR PAIN  UNUSUAL VAGINAL DISCHARGE OR ITCHING   Items with * indicate a potential emergency and should be followed up as soon as possible or go to the Emergency Department if any problems should occur.  Please show the CHEMOTHERAPY ALERT CARD or IMMUNOTHERAPY ALERT CARD at check-in to the Emergency Department and triage nurse.  Should you have questions after your visit or need to cancel or reschedule your appointment, please contact [facility] - A DEPT OF [facility]  Dept: [phone]  and follow the prompts.  Office hours are 8:00 a.m. to 4:30 p.m. Monday -  Friday. Please note that voicemails left after 4:00 p.m. may not be returned until the following business day.  We are closed weekends and major holidays. You have access to a nurse at all times for urgent questions. Please call the main number to the clinic Dept: [phone] and follow the prompts.   For any non-urgent questions, you may also contact your provider using MyChart. We now offer e-Visits for anyone 59 and older to request care online for non-urgent symptoms. For details visit [email or web address].   Also download the MyChart app! Go to the app store, search MyChart, open the app, select [place], and log in with your MyChart username and password.  PUMP STOP at [facility] 11/17/2023 at 11:45am

## 2023-11-15 NOTE — Progress Notes (Signed)
 Patient presents today for chemotherapy infusion of Oxaliplatin , Leucovorin , and Adrucil . Patient did report slight increase in cough, lightheadedness, and nausea last night after [place]  treatment in which he also got compazine . Patient denies any nausea or dizziness now.Vital signs are stable with exception of B/P 86/62 with recheck of 82/32.  Olam Ned NP and Lacie Burton NP aware, Olam Ned assessed patient and approval given to continue with treatment and continue to monitor B/P per [facility] approval.  Labs are within treatment parameters.  We will proceed with treatment per NP/MD orders.   Patient tolerated treatment well with no complaints voiced. Vital signs stable at discharge. Manual B/P 100/60, patient denies any nausea or dizziness Olam Ned NP made aware and approval given for patient to be discharged without further intervention.  Patient left ambulatory in stable condition. Follow up as scheduled.

## 2023-11-16 ENCOUNTER — Other Ambulatory Visit: Payer: Self-pay

## 2023-11-17 ENCOUNTER — Inpatient Hospital Stay

## 2023-11-17 VITALS — BP 105/57 | HR 92 | Temp 98.2°F | Resp 18

## 2023-11-17 DIAGNOSIS — Z5111 Encounter for antineoplastic chemotherapy: Secondary | ICD-10-CM | POA: Diagnosis not present

## 2023-11-17 DIAGNOSIS — C169 Malignant neoplasm of stomach, unspecified: Secondary | ICD-10-CM

## 2023-11-17 MED ORDER — HEPARIN SOD (PORK) LOCK FLUSH 100 UNIT/ML IV SOLN
500.0000 [IU] | Freq: Once | INTRAVENOUS | Status: AC | PRN
Start: 2023-11-17 — End: 2023-11-17
  Administered 2023-11-17: 500 [IU]

## 2023-11-17 MED ORDER — SODIUM CHLORIDE 0.9% FLUSH
10.0000 mL | INTRAVENOUS | Status: DC | PRN
  Administered 2023-11-17: 10 mL

## 2023-11-18 ENCOUNTER — Encounter: Payer: Self-pay | Admitting: Oncology

## 2023-11-19 ENCOUNTER — Encounter: Payer: Self-pay | Admitting: *Deleted

## 2023-11-19 ENCOUNTER — Other Ambulatory Visit: Payer: Self-pay | Admitting: *Deleted

## 2023-11-20 ENCOUNTER — Telehealth: Payer: Self-pay | Admitting: *Deleted

## 2023-11-20 NOTE — Telephone Encounter (Signed)
 Called Todd Harrison to f/u on status. He reports no nausea today and no vomiting. Not eating solid food yet, but he is drinking Boost several times/day and pushing water. No BM in 2 days, so has started Colace. Informed him he can take up to 4/day if needed. Can add MiraLax  tonight if no BM today. BP is back to his baseline at 101/66 today and has not felt lightheaded today. He agrees to call office if he develops any issues or he can't keep fluids down.

## 2023-11-21 ENCOUNTER — Encounter: Payer: Self-pay | Admitting: Oncology

## 2023-11-22 ENCOUNTER — Encounter: Payer: Self-pay | Admitting: Nurse Practitioner

## 2023-11-22 ENCOUNTER — Other Ambulatory Visit (HOSPITAL_BASED_OUTPATIENT_CLINIC_OR_DEPARTMENT_OTHER): Payer: Self-pay

## 2023-11-22 ENCOUNTER — Inpatient Hospital Stay (HOSPITAL_BASED_OUTPATIENT_CLINIC_OR_DEPARTMENT_OTHER): Admitting: Nurse Practitioner

## 2023-11-22 ENCOUNTER — Ambulatory Visit

## 2023-11-22 ENCOUNTER — Inpatient Hospital Stay

## 2023-11-22 VITALS — BP 98/60 | HR 76 | Temp 98.2°F | Resp 18

## 2023-11-22 DIAGNOSIS — Z95828 Presence of other vascular implants and grafts: Secondary | ICD-10-CM

## 2023-11-22 DIAGNOSIS — C169 Malignant neoplasm of stomach, unspecified: Secondary | ICD-10-CM

## 2023-11-22 DIAGNOSIS — C800 Disseminated malignant neoplasm, unspecified: Secondary | ICD-10-CM

## 2023-11-22 DIAGNOSIS — Z5111 Encounter for antineoplastic chemotherapy: Secondary | ICD-10-CM | POA: Diagnosis not present

## 2023-11-22 LAB — CMP (CANCER CENTER ONLY)
ALT: 12 U/L (ref 0–44)
AST: 16 U/L (ref 15–41)
Albumin: 3.3 g/dL — ABNORMAL LOW (ref 3.5–5.0)
Alkaline Phosphatase: 200 U/L — ABNORMAL HIGH (ref 38–126)
Anion gap: 11 (ref 5–15)
BUN: 23 mg/dL (ref 8–23)
CO2: 23 mmol/L (ref 22–32)
Calcium: 8.3 mg/dL — ABNORMAL LOW (ref 8.9–10.3)
Chloride: 105 mmol/L (ref 98–111)
Creatinine: 0.78 mg/dL (ref 0.61–1.24)
GFR, Estimated: >60 mL/min (ref >60)
Glucose, Bld: 122 mg/dL — ABNORMAL HIGH (ref 70–99)
Potassium: 4.2 mmol/L (ref 3.5–5.1)
Sodium: 139 mmol/L (ref 135–145)
Total Bilirubin: 0.5 mg/dL (ref 0.0–1.2)
Total Protein: 5.7 g/dL — ABNORMAL LOW (ref 6.5–8.1)

## 2023-11-22 LAB — CBC WITH DIFFERENTIAL (CANCER CENTER ONLY)
Abs Immature Granulocytes: 0.04 K/uL (ref 0.00–0.07)
Basophils Absolute: 0.1 K/uL (ref 0.0–0.1)
Basophils Relative: 1 %
Eosinophils Absolute: 0.2 K/uL (ref 0.0–0.5)
Eosinophils Relative: 2 %
HCT: 35.6 % — ABNORMAL LOW (ref 39.0–52.0)
Hemoglobin: 11.2 g/dL — ABNORMAL LOW (ref 13.0–17.0)
Immature Granulocytes: 0 %
Lymphocytes Relative: 14 %
Lymphs Abs: 1.3 K/uL (ref 0.7–4.0)
MCH: 27.9 pg (ref 26.0–34.0)
MCHC: 31.5 g/dL (ref 30.0–36.0)
MCV: 88.8 fL (ref 80.0–100.0)
Monocytes Absolute: 0.4 K/uL (ref 0.1–1.0)
Monocytes Relative: 5 %
Neutro Abs: 7.4 K/uL (ref 1.7–7.7)
Neutrophils Relative %: 78 %
Platelet Count: 196 K/uL (ref 150–400)
RBC: 4.01 MIL/uL — ABNORMAL LOW (ref 4.22–5.81)
RDW: 19.7 % — ABNORMAL HIGH (ref 11.5–15.5)
WBC Count: 9.4 K/uL (ref 4.0–10.5)
nRBC: 0 % (ref 0.0–0.2)

## 2023-11-22 MED ORDER — HEPARIN SOD (PORK) LOCK FLUSH 100 UNIT/ML IV SOLN
500.0000 [IU] | INTRAVENOUS | Status: AC | PRN
  Administered 2023-11-22: 500 [IU]

## 2023-11-22 MED ORDER — PANTOPRAZOLE SODIUM 40 MG PO TBEC
40.0000 mg | DELAYED_RELEASE_TABLET | Freq: Two times a day (BID) | ORAL | 1 refills | Status: AC
  Filled 2023-11-22: qty 60, 30d supply, fill #0
  Filled 2024-01-07: qty 60, 30d supply, fill #1

## 2023-11-22 MED ORDER — SODIUM CHLORIDE 0.9% FLUSH
10.0000 mL | INTRAVENOUS | Status: AC | PRN
  Administered 2023-11-22: 10 mL

## 2023-11-22 MED ORDER — SODIUM CHLORIDE 0.9 % IV SOLN: INTRAVENOUS | Status: AC

## 2023-11-22 NOTE — Patient Instructions (Signed)
 [facility] - A DEPT OF [place]  Discharge Instructions: Thank you for choosing [facility] to provide your oncology and hematology care.   If you have a lab appointment with the Cancer Center, please go directly to the Cancer Center and check in at the registration area.   Wear comfortable clothing and clothing appropriate for easy access to any Portacath or PICC line.   We strive to give you quality time with your provider. You may need to reschedule your appointment if you arrive late (15 or more minutes).  Arriving late affects you and other patients whose appointments are after yours.  Also, if you miss three or more appointments without notifying the office, you may be dismissed from the clinic at the provider's discretion.      For prescription refill requests, have your pharmacy contact our office and allow 72 hours for refills to be completed.    Today you received the following: Normal Saline      To help prevent nausea and vomiting after your treatment, we encourage you to take your nausea medication as directed.  BELOW ARE SYMPTOMS THAT SHOULD BE REPORTED IMMEDIATELY: *FEVER GREATER THAN 100.4 F (38 C) OR HIGHER *CHILLS OR SWEATING *NAUSEA AND VOMITING THAT IS NOT CONTROLLED WITH YOUR NAUSEA MEDICATION *UNUSUAL SHORTNESS OF BREATH *UNUSUAL BRUISING OR BLEEDING *URINARY PROBLEMS (pain or burning when urinating, or frequent urination) *BOWEL PROBLEMS (unusual diarrhea, constipation, pain near the anus) TENDERNESS IN MOUTH AND THROAT WITH OR WITHOUT PRESENCE OF ULCERS (sore throat, sores in mouth, or a toothache) UNUSUAL RASH, SWELLING OR PAIN  UNUSUAL VAGINAL DISCHARGE OR ITCHING   Items with * indicate a potential emergency and should be followed up as soon as possible or go to the Emergency Department if any problems should occur.  Please show the CHEMOTHERAPY ALERT CARD or IMMUNOTHERAPY ALERT CARD at check-in to the  Emergency Department and triage nurse.  Should you have questions after your visit or need to cancel or reschedule your appointment, please contact [facility] - A DEPT OF [facility]  Dept: [phone]  and follow the prompts.  Office hours are 8:00 a.m. to 4:30 p.m. Monday - Friday. Please note that voicemails left after 4:00 p.m. may not be returned until the following business day.  We are closed weekends and major holidays. You have access to a nurse at all times for urgent questions. Please call the main number to the clinic Dept: [phone] and follow the prompts.   For any non-urgent questions, you may also contact your provider using MyChart. We now offer e-Visits for anyone 71 and older to request care online for non-urgent symptoms. For details visit [email or web address].   Also download the MyChart app! Go to the app store, search MyChart, open the app, select [facility], and log in with your MyChart username and password.  Nausea, Adult Nausea is feeling like you may vomit. Feeling like you may vomit is usually not serious, but it may be an early sign of a more serious medical problem. Vomiting is when stomach contents forcefully come out of your mouth. If you vomit, or if you are not able to drink enough fluids, you may not have enough water in your body (get dehydrated). If you do not have enough water in your body, you may: Feel tired. Feel thirsty. Have a dry mouth. Have cracked lips. Pee (urinate) less often. Older adults and people who have  other diseases or a weak body defense system (immune system) have a higher risk of not having enough water in the body. The main goals of treating this condition are: To relieve your nausea. To ensure your nausea occurs less often. To prevent vomiting and losing too much fluid. Follow these instructions at home: Watch your symptoms for any changes. Tell your doctor about them. Eating and  drinking     Take an ORS (oral rehydration solution). This is a drink that is sold at pharmacies and stores. Drink clear fluids in small amounts as you are able. These include: Water. Ice chips. Fruit juice that has water added (diluted fruit juice). Low-calorie sports drinks. Eat bland, easy-to-digest foods in small amounts as you are able, such as: Bananas. Applesauce. Rice. Low-fat (lean) meats. Toast. Crackers. Avoid drinking fluids that have a lot of sugar or caffeine in them. This includes energy drinks, sports drinks, and soda. Avoid alcohol . Avoid spicy or fatty foods. General instructions Take over-the-counter and prescription medicines only as told by your doctor. Rest at home while you get better. Drink enough fluid to keep your pee (urine) pale yellow. Take slow and deep breaths when you feel like you may vomit. Avoid food or things that have strong smells. Wash your hands often with soap and water for at least 20 seconds. If you cannot use soap and water, use hand sanitizer. Make sure that everyone in your home washes their hands well and often. Keep all follow-up visits. Contact a doctor if: You feel worse. You feel like you may vomit and this lasts for more than 2 days. You vomit. You are not able to drink fluids without vomiting. You have new symptoms. You have a fever. You have a headache. You have muscle cramps. You have a rash. You have pain while peeing. You feel light-headed or dizzy. Get help right away if: You have pain in your chest, neck, arm, or jaw. You feel very weak or you faint. You have vomit that is bright red or looks like coffee grounds. You have bloody or black poop (stools) or poop that looks like tar. You have a very bad headache, a stiff neck, or both. You have very bad pain, cramping, or bloating in your belly (abdomen). You have trouble breathing or you are breathing very quickly. Your heart is beating very quickly. Your skin  feels cold and clammy. You feel confused. You have signs of losing too much water in your body, such as: Dark pee, very little pee, or no pee. Cracked lips. Dry mouth. Sunken eyes. Sleepiness. Weakness. These symptoms may be an emergency. Get help right away. Call 911. Do not wait to see if the symptoms will go away. Do not drive yourself to the hospital. Summary Nausea is feeling like you are about vomit. If you vomit, or if you are not able to drink enough fluids, you may not have enough water in your body (get dehydrated). Eat and drink what your doctor tells you. Take over-the-counter and prescription medicines only as told by your doctor. Contact a doctor right away if your symptoms get worse or you have new symptoms. Keep all follow-up visits. This information is not intended to replace advice given to you by your health care provider. Make sure you discuss any questions you have with your health care provider. Document Revised: 10/29/2020 Document Reviewed: 10/29/2020 Elsevier Patient Education  2024 Else[employer] Inc.Dehydration, Adult   Dehydration is a condition in which there is not enough water  or other fluids in the body. This happens when a person loses more fluids than they take in. Important organs cannot work right without the right amount of fluids. Any loss of fluids from the body can cause dehydration. Dehydration can be mild, worse, or very bad. It should be treated right away to keep it from getting very bad. What are the causes? Conditions that cause loss of water in the body. They include: Watery poop (diarrhea). Vomiting. Sweating a lot. Fever. Infection. Peeing (urinating) a lot. Not drinking enough fluids. Certain medicines, such as medicines that take extra fluid out of the body (diuretics). Lack of safe drinking water. Not being able to get enough water and food. What increases the risk? Having a long-term (chronic) illness that has not been treated the  right way, such as: Diabetes. Heart disease. Kidney disease. Being 79 years of age or older. Having a disability. Living in a place that is high above the ground or sea (high in altitude). The thinner, drier air causes more fluid loss. Doing exercises that put stress on your body for a long time. Being active when in hot places. What are the signs or symptoms? Symptoms of dehydration depend on how bad it is. Mild or worse dehydration Thirst. Dry lips or dry mouth. Feeling dizzy or light-headed. Muscle cramps. Passing little pee or dark pee. Pee may be the color of tea. Headache. Very bad dehydration Changes in skin. Skin may: Be cold to the touch (clammy). Be blotchy or pale. Not go back to normal right after you pinch it and let it go. Little or no tears, pee, or sweat. Fast breathing. Low blood pressure. Weak pulse. Pulse that is more than 100 beats a minute when you are sitting still. Other changes, such as: Feeling very thirsty. Eyes that look hollow (sunken). Cold hands and feet. Being confused. Being very tired (lethargic) or having trouble waking from sleep. Losing weight. Loss of consciousness. How is this treated? Treatment for this condition depends on how bad your dehydration is. Treatment should start right away. Do not wait until your condition gets very bad. Very bad dehydration is an emergency. You will need to go to a hospital. Mild or worse dehydration can be treated at home. You may be asked to: Drink more fluids. Drink an oral rehydration solution (ORS). This drink gives you the right amount of fluids, salts, and minerals (electrolytes). Very bad dehydration can be treated: With fluids through an IV tube. By correcting low levels of electrolytes in the body. By treating the problem that caused your dehydration. Follow these instructions at home: Oral rehydration solution If told by your doctor, drink an ORS: Make an ORS. Use instructions on the  package. Start by drinking small amounts, about  cup (120 mL) every 5-10 minutes. Slowly drink more until you have had the amount that your doctor said to have.  Eating and drinking  Drink enough clear fluid to keep your pee pale yellow. If you were told to drink an ORS, finish the ORS first. Then, start slowly drinking other clear fluids. Drink fluids such as: Water. Do not drink only water. Doing that can make the salt (sodium) level in your body get too low. Water from ice chips you suck on. Fruit juice that you have added water to (diluted). Low-calorie sports drinks. Eat foods that have the right amounts of salts and minerals, such as bananas, oranges, potatoes, tomatoes, or spinach. Do not drink alcohol . Avoid drinks that have caffeine  or sugar. These include:: High-calorie sports drinks. Fruit juice that you did not add water to. Soda. Coffee or energy drinks. Avoid foods that are greasy or have a lot of fat or sugar. General instructions Take over-the-counter and prescription medicines only as told by your doctor. Do not take sodium tablets. Doing that can make the salt level in your body get too high. Return to your normal activities as told by your doctor. Ask your doctor what activities are safe for you. Keep all follow-up visits. Your doctor may check and change your treatment. Contact a doctor if: You have pain in your belly (abdomen) and the pain: Gets worse. Stays in one place. You have a rash. You have a stiff neck. You get angry or annoyed more easily than normal. You are more tired or have a harder time waking than normal. You feel weak or dizzy. You feel very thirsty. Get help right away if: You have any symptoms of very bad dehydration. You vomit every time you eat or drink. Your vomiting gets worse, does not go away, or you vomit blood or green stuff. You are getting treatment, but symptoms are getting worse. You have a fever. You have a very bad  headache. You have: Diarrhea that gets worse or does not go away. Blood in your poop (stool). This may cause poop to look black and tarry. No pee in 6-8 hours. Only a small amount of pee in 6-8 hours, and the pee is very dark. You have trouble breathing. These symptoms may be an emergency. Get help right away. Call 911. Do not wait to see if the symptoms will go away. Do not drive yourself to the hospital. This information is not intended to replace advice given to you by your health care provider. Make sure you discuss any questions you have with your health care provider. Document Revised: 11/21/2021 Document Reviewed: 11/21/2021 Elsevier Patient Education  2024 [employer].

## 2023-11-22 NOTE — Progress Notes (Signed)
 Pt coming in today to receive fluids. Pt states that he has struggled with eating and drinking the past 4 days and has had minimal intake due to nausea. Pt states that he has been vomiting. Pt also stating that he is having constipation. BP: 104/70 and HR 91. Fluids started per [facility].  Provider notified of pt symptoms. Provider ordering CMP and CBC to be drawn and came to see patient.

## 2023-11-22 NOTE — Progress Notes (Signed)
 [facility] OFFICE PROGRESS NOTE   Diagnosis: Gastric cancer  INTERVAL HISTORY:   Todd Harrison is seen in an unscheduled visit while receiving IV fluids.  He reports continued intermittent nausea.  No diarrhea.  Now having constipation, last bowel movement 4 days ago.  He took Colace earlier.  He reports reflux symptoms which cause him to cough.  Reflux symptoms are better controlled when he was taking pantoprazole  40 mg twice daily.  Most recently he has been taking 20 mg twice daily.  He would like a prescription for the higher strength.  He feels he is unable to maintain adequate nutrition/hydration orally and would like to consider alternatives for nutrition.  Objective:  Vital signs in last 24 hours:  Temperature 97.9, heart rate 91, respirations 18, blood pressure 104/70, oxygen saturation 99%    HEENT: No thrush or ulcers.  Mucous membranes appear moist. Resp: Lungs clear bilaterally. Cardio: Regular rate and rhythm. GI: Firm fullness right to mid abdomen. Vascular: Trace edema lower leg bilaterally right slightly greater than left. Neuro: Alert and oriented. Port-A-Cath without erythema.  Lab Results:  Lab Results  Component Value Date   WBC 9.4 11/22/2023   HGB 11.2 (L) 11/22/2023   HCT 35.6 (L) 11/22/2023   MCV 88.8 11/22/2023   PLT 196 11/22/2023   NEUTROABS 7.4 11/22/2023    Imaging:  No results found.  Medications: I have reviewed the patient's current medications.  Assessment/Plan: Gastric cancer with CT evidence of abdominal carcinomatosis CT abdomen/pelvis 04/13/2022-ascites, diffuse omental and peritoneal nodularity, possible circumferential mass in the transverse colon, new small left greater than right pleural effusions CT abdominal mass biopsy 04/24/2022-metastatic poorly differentiated adenocarcinoma with very focal signet ring cell features CT paracentesis 04/24/2022-no malignant cells identified Colonoscopy 04/25/2022-diverticulosis  in the sigmoid colon.  Internal hemorrhoids.  No specimens collected. Upper endoscopy 04/25/2022-large infiltrative mass with oozing bleeding and stigmata of recent bleeding in the gastric fundus, on the anterior wall of the gastric body and on the greater curvature of the gastric body; deformity in the gastric antrum.  Extrinsic deformity in the entire duodenum.  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 Her2 by IHC negative, 1+; MMR IHC normal; PD-L1 CPS 1%, positive. Claudin 18 positive, 100% Cycle 1 FOLFOX 05/04/2022 Cycle 2 FOLFOX 05/17/2022 Cycle 3 FOLFOX 05/31/2022-oxaliplatin  dose reduced secondary to prolonged cold sensitivity Cycle 4 FOLFOX 06/14/2022 Cycle 5 FOLFOX 06/28/2022 CT abdomen/pelvis 07/08/2022-asymmetric wall thickening at the lesser curvature of the stomach, decreased ascites, increased diffuse omental and peritoneal nodularity Cycle 6 FOLFOX 07/12/2022 Cycle 7 FOLFOX 08/09/2022 Cycle 8 FOLFOX 08/23/2022 Cycle 9 FOLFOX 09/06/2022 CTs 09/11/2022-stable, no new or progressive interval findings Cycle 10 FOLFOX 09/20/2022 Xeloda  maintenance 2 weeks on/1 week off 10/16/2022 Cycle 2 Xeloda  11/06/2022-Xeloda  dose reduced secondary to early foot skin toxicity Cycle 3 Xeloda  11/27/2022 Cycle 4 Xeloda  12/18/2022 CTs 01/02/2023-wall thickening of the mid gastric body-possibly progressive, unchanged peritoneal/omental caking, unchanged L1 compression fracture Cycle 5 Xeloda  beginning 01/08/2023 Cycle 6 Xeloda  beginning 01/29/2023 Cycle 7 Xeloda  beginning 02/19/2023 Cycle 8 Xeloda  beginning 03/12/2023 Cycle 9 Xeloda  beginning 04/09/2023 Cycle 10 Xeloda  beginning 05/07/2023 CTs 05/21/2023: Unchanged wall thickening of the gastric body, slight increase in small volume ascites, diffuse peritoneal thickening and stranding of the omentum, no evidence of metastatic disease to the chest Cycle 11 Xeloda  beginning 05/28/2023 Cycle 12 Xeloda  beginning 06/18/2023 Cycle 13  Xeloda  beginning 07/09/2023 Cycle 14 Xeloda  beginning 07/30/2023 CTA abdomen/pelvis 08/14/2023: Large volume of heterogenous material in the gastric  lumen-food/blood, small volume ascites with diffuse peritoneal thickening and matting of the bowel/omentum, enhancement of the gastric body 08/18/2023: EGD-large mass in the gastric fundus/body with no bleeding, clean-based ulcers overlying 1 area of the mass, normal duodenum Cycle 15 Xeloda  09/03/2023 Cycle 16 Xeloda  09/24/2023 CTs 10/31/2023-moderate gastric body wall thickening similar.  Ill-definition of soft tissue planes in the lesser sac and porta hepatis new or progressive.  Development of intrahepatic duct dilatation.  Increase in small volume abdominal ascites with persistent peritoneal metastasis.  New trace left pleural fluid.  New T8 heterogeneous sclerosis and vertebral body height loss. FOLFOX plus zolbetuximab beginning 11/14/2023 (zolbetuximab 11/14/2023, FOLFOX 11/15/2023) Truncal rash-status post recent punch biopsy with pathology pending; biopsy reported negative per family 04/26/2022 Lichen planus of the feet Hypothyroidism Atrial fibrillation Admission 07/24/2022 with acute bilateral pulmonary embolism/right heart strain Heparin  anticoagulation 07/24/2022 Doppler 07/24/2022-acute DVT of the right femoral, popliteal, posterior tibial, and peroneal veins, acute left posterior tibial DVT Lovenox  anticoagulation, changed to once daily dosing 05/25/2023 Lovenox  changed to 40 mg daily for 2425 7.  Anemia secondary to chemotherapy, phlebotomy, and hemoptysis-improved 8.  L1 compression fracture on plain x-ray 12/18/2022 MRI lumbar spine 02/02/2023-acute/subacute L1 compression fracture, mild degenerative change of the lumbar spine 9.  Admission 08/14/2023 with hematemesis 10. C. Difficile positive by PCR-completed course of Vancomycin     Disposition: Todd Harrison has metastatic gastric cancer.  He completed cycle 1 FOLFOX plus zolbetuximab  11/14/2023-11/15/2023.  He presents today with persistent intermittent nausea, poor oral intake.  He does not feel he can adequately maintain hydration/nutrition by mouth.  A feeding tube is not a good option for him due to the cancer.  We discussed parenteral nutrition. We discussed the infection risk.  He would like to proceed. We will contact nutrition services for assistance in arranging for TPN.   CBC and chemistry panel from today reviewed, stable.  He will follow-up next week as scheduled.  Plan reviewed with Dr. Cloretta.  Olam Ned ANP/GNP-BC   11/22/2023  3:32 PM  Todd Harrison has severe malnutrition.  He is not a candidate for a feeding tube due to the tumor in his stomach.  I recommend TPN for long-term use inability to maintain his nutritional status and weight.  He is not a candidate for a percutaneous, endoscopic, or surgical feeding tube.  Arvella Cloretta, MD

## 2023-11-23 ENCOUNTER — Other Ambulatory Visit: Payer: Self-pay | Admitting: *Deleted

## 2023-11-23 DIAGNOSIS — C169 Malignant neoplasm of stomach, unspecified: Secondary | ICD-10-CM

## 2023-11-23 LAB — PHOSPHORUS: Phosphorus: 2.7 mg/dL (ref 2.5–4.6)

## 2023-11-23 LAB — MAGNESIUM: Magnesium: 2 mg/dL (ref 1.7–2.4)

## 2023-11-23 NOTE — Progress Notes (Signed)
Add-on labs

## 2023-11-23 NOTE — Progress Notes (Signed)
 Spoke w/daughter, Betsey and she is asking what to do if he becomes hypotensive or can't drink over the weekend. Informed her to call on call team and may need to go to emergency room since clinic is closed on weekends. All TPN information and referral has been ordered.

## 2023-11-24 ENCOUNTER — Other Ambulatory Visit: Payer: Self-pay | Admitting: Oncology

## 2023-11-26 ENCOUNTER — Encounter: Payer: Self-pay | Admitting: Oncology

## 2023-11-26 ENCOUNTER — Encounter: Payer: Self-pay | Admitting: Nutrition

## 2023-11-26 NOTE — Progress Notes (Signed)
 Patient has continuing weight loss. Patient unable to eat and drink and had severe nausea with vomiting. Patient able to receive IVF and reportedly felt much better. Sent note to provider recommending TPN to support patient as he continues to have treatments for his metastatic gastric cancer. Provider agreed and TPN ordered through [facility]. Notified by Alfreda that patient should begin TPN today.

## 2023-11-27 ENCOUNTER — Telehealth: Payer: Self-pay | Admitting: *Deleted

## 2023-11-27 NOTE — Telephone Encounter (Addendum)
 Called to ask if he can still have chemotherapy with TPN going over 18 hours? Scheduled for 7/23. Per Dr. Cloretta: Will need to stop TPN during treatment on 7/23 or receive treatment that day peripheral. On 7/24, he will need to stop the TPN during treatment and hold TPN until his pump is removed. He will see provider tomorrow and can discuss in more detail if necessary. Daughter notified and plans to accompany him tomorrow to visit.

## 2023-11-28 ENCOUNTER — Inpatient Hospital Stay

## 2023-11-28 ENCOUNTER — Encounter: Payer: Self-pay | Admitting: Nurse Practitioner

## 2023-11-28 ENCOUNTER — Inpatient Hospital Stay: Admitting: Nurse Practitioner

## 2023-11-28 ENCOUNTER — Other Ambulatory Visit (HOSPITAL_BASED_OUTPATIENT_CLINIC_OR_DEPARTMENT_OTHER): Payer: Self-pay

## 2023-11-28 VITALS — BP 90/68 | HR 88 | Temp 97.8°F | Resp 18 | Ht 72.0 in | Wt 205.2 lb

## 2023-11-28 VITALS — BP 113/76 | HR 114 | Temp 98.0°F | Resp 18

## 2023-11-28 DIAGNOSIS — C169 Malignant neoplasm of stomach, unspecified: Secondary | ICD-10-CM

## 2023-11-28 DIAGNOSIS — C168 Malignant neoplasm of overlapping sites of stomach: Secondary | ICD-10-CM | POA: Diagnosis not present

## 2023-11-28 DIAGNOSIS — Z5111 Encounter for antineoplastic chemotherapy: Secondary | ICD-10-CM | POA: Diagnosis not present

## 2023-11-28 LAB — CBC WITH DIFFERENTIAL (CANCER CENTER ONLY)
Abs Immature Granulocytes: 0.01 K/uL (ref 0.00–0.07)
Basophils Absolute: 0 K/uL (ref 0.0–0.1)
Basophils Relative: 1 %
Eosinophils Absolute: 0.2 K/uL (ref 0.0–0.5)
Eosinophils Relative: 3 %
HCT: 33.7 % — ABNORMAL LOW (ref 39.0–52.0)
Hemoglobin: 11 g/dL — ABNORMAL LOW (ref 13.0–17.0)
Immature Granulocytes: 0 %
Lymphocytes Relative: 16 %
Lymphs Abs: 1.1 K/uL (ref 0.7–4.0)
MCH: 28.9 pg (ref 26.0–34.0)
MCHC: 32.6 g/dL (ref 30.0–36.0)
MCV: 88.5 fL (ref 80.0–100.0)
Monocytes Absolute: 0.6 K/uL (ref 0.1–1.0)
Monocytes Relative: 8 %
Neutro Abs: 5.1 K/uL (ref 1.7–7.7)
Neutrophils Relative %: 72 %
Platelet Count: 157 K/uL (ref 150–400)
RBC: 3.81 MIL/uL — ABNORMAL LOW (ref 4.22–5.81)
RDW: 20.3 % — ABNORMAL HIGH (ref 11.5–15.5)
WBC Count: 7.1 K/uL (ref 4.0–10.5)
nRBC: 0 % (ref 0.0–0.2)

## 2023-11-28 LAB — CMP (CANCER CENTER ONLY)
ALT: 32 U/L (ref 0–44)
AST: 69 U/L — ABNORMAL HIGH (ref 15–41)
Albumin: 3.2 g/dL — ABNORMAL LOW (ref 3.5–5.0)
Alkaline Phosphatase: 324 U/L — ABNORMAL HIGH (ref 38–126)
Anion gap: 10 (ref 5–15)
BUN: 14 mg/dL (ref 8–23)
CO2: 25 mmol/L (ref 22–32)
Calcium: 8.9 mg/dL (ref 8.9–10.3)
Chloride: 105 mmol/L (ref 98–111)
Creatinine: 0.89 mg/dL (ref 0.61–1.24)
GFR, Estimated: >60 mL/min (ref >60)
Glucose, Bld: 101 mg/dL — ABNORMAL HIGH (ref 70–99)
Potassium: 3.9 mmol/L (ref 3.5–5.1)
Sodium: 139 mmol/L (ref 135–145)
Total Bilirubin: 0.8 mg/dL (ref 0.0–1.2)
Total Protein: 5.8 g/dL — ABNORMAL LOW (ref 6.5–8.1)

## 2023-11-28 LAB — PHOSPHORUS: Phosphorus: 3.5 mg/dL (ref 2.5–4.6)

## 2023-11-28 LAB — MAGNESIUM: Magnesium: 2.1 mg/dL (ref 1.7–2.4)

## 2023-11-28 MED ORDER — MIRTAZAPINE 7.5 MG PO TABS
7.5000 mg | ORAL_TABLET | Freq: Every day | ORAL | 3 refills | Status: DC
  Filled 2023-11-28: qty 30, 30d supply, fill #0

## 2023-11-28 MED ORDER — SODIUM CHLORIDE 0.9 % IV SOLN
INTRAVENOUS | Status: DC
Start: 2023-11-28 — End: 2023-11-28

## 2023-11-28 MED ORDER — OLANZAPINE 5 MG PO TABS
5.0000 mg | ORAL_TABLET | Freq: Once | ORAL | Status: AC
  Administered 2023-11-28: 5 mg via ORAL
  Filled 2023-11-28: qty 1

## 2023-11-28 MED ORDER — APREPITANT 130 MG/18ML IV EMUL
130.0000 mg | Freq: Once | INTRAVENOUS | Status: AC
  Administered 2023-11-28: 130 mg via INTRAVENOUS
  Filled 2023-11-28: qty 18

## 2023-11-28 MED ORDER — PROCHLORPERAZINE MALEATE 10 MG PO TABS
10.0000 mg | ORAL_TABLET | Freq: Four times a day (QID) | ORAL | 1 refills | Status: DC | PRN
Start: 2023-11-28 — End: 2023-12-26
  Filled 2023-11-28: qty 30, 8d supply, fill #0

## 2023-11-28 MED ORDER — DEXAMETHASONE SODIUM PHOSPHATE 10 MG/ML IJ SOLN
10.0000 mg | Freq: Once | INTRAMUSCULAR | Status: AC
  Administered 2023-11-28: 10 mg via INTRAVENOUS
  Filled 2023-11-28: qty 1

## 2023-11-28 MED ORDER — PALONOSETRON HCL INJECTION 0.25 MG/5ML
0.2500 mg | Freq: Once | INTRAVENOUS | Status: AC
  Administered 2023-11-28: 0.25 mg via INTRAVENOUS
  Filled 2023-11-28: qty 5

## 2023-11-28 MED ORDER — SODIUM CHLORIDE 0.9% FLUSH
10.0000 mL | INTRAVENOUS | Status: DC | PRN
  Administered 2023-11-28: 10 mL

## 2023-11-28 MED ORDER — ZOLBETUXIMAB-CLZB CHEMO 100MG/5ML IV SOLN
400.0000 mg/m2 | Freq: Once | INTRAVENOUS | Status: AC
  Administered 2023-11-28: 885 mg via INTRAVENOUS
  Filled 2023-11-28: qty 44.25

## 2023-11-28 MED ORDER — HEPARIN SOD (PORK) LOCK FLUSH 100 UNIT/ML IV SOLN
500.0000 [IU] | Freq: Once | INTRAVENOUS | Status: DC | PRN
Start: 2023-11-28 — End: 2023-11-28

## 2023-11-28 MED ORDER — SODIUM CHLORIDE 0.9 % IV SOLN: Freq: Once | INTRAVENOUS | Status: AC

## 2023-11-28 MED ORDER — LORAZEPAM 1 MG PO TABS: 0.5000 mg | ORAL_TABLET | ORAL | Status: DC | PRN

## 2023-11-28 MED ORDER — PROCHLORPERAZINE MALEATE 10 MG PO TABS
5.0000 mg | ORAL_TABLET | Freq: Once | ORAL | Status: AC
  Administered 2023-11-28: 5 mg via ORAL
  Filled 2023-11-28: qty 1

## 2023-11-28 NOTE — Progress Notes (Signed)
 Faxed signed TPN orders to [facility] at [phone]

## 2023-11-28 NOTE — Progress Notes (Addendum)
 [facility]   [phone] [phone]    Patient Care Team: Todd Ingle, MD as PCP - General (Internal Medicine) Todd Powell BRAVO, MD (Inactive) as PCP - Cardiology (Cardiology) Todd Ole DASEN, MD as PCP - Electrophysiology (Cardiology) Todd Sari SQUIBB, RN as Oncology Nurse Navigator   CHIEF COMPLAINT: Follow up metastatic gastric cancer   CURRENT THERAPY: FOLFOX plus zolbetuximab  INTERVAL HISTORY Todd Harrison returns for follow up and treatment as scheduled, last Began zolbetux day 1/folfox day 2-4 on 7/9, seen 7/17 for [facility] visit for malnutrition. He has since started TPN cycled over 18 hours daily.  This morning had to disconnect early to come in for treatment.  Chemo itself went okay, does not recall much nausea and no vomiting.  Bowels moving consistently, slightly loose since starting TPN but no more than 2 times per day.  He is not taking in much of anything by mouth.  The vomiting occurs when he consumes larger volumes. Having some difficulty sleeping.  He works some yesterday and is up and active at home.  Denies dizziness on standing.  Denies cold sensitivity, neuropathy, rash, mucositis, fever, chills, cough, chest pain, dyspnea, pain, or any new or specific complaints.  ROS  All other systems reviewed and negative  Past Medical History:  Diagnosis Date   A-fib (HCC)    Asthma    Cataract    Gastric cancer (HCC)    GERD (gastroesophageal reflux disease)    Lichen planus    Methotrexate, long term, current use    no longer taking   Psoriasis    Sleep apnea    Thyroid  disease    Urticaria      Past Surgical History:  Procedure Laterality Date   CARDIOVERSION N/A 02/23/2020   Procedure: CARDIOVERSION;  Surgeon: Santo Stanly LABOR, MD;  Location: [facility] ENDOSCOPY;  Service: Cardiovascular;  Laterality: N/A;   CARDIOVERSION N/A 04/12/2020   Procedure: CARDIOVERSION;  Surgeon: Lonni Slain, MD;  Location: [facility]  ENDOSCOPY;  Service: Cardiovascular;  Laterality: N/A;   CATARACT EXTRACTION Left    2019   ESOPHAGOGASTRODUODENOSCOPY N/A 08/18/2023   Procedure: EGD (ESOPHAGOGASTRODUODENOSCOPY);  Surgeon: Legrand Victory LITTIE DOUGLAS, MD;  Location: [facility] ENDOSCOPY;  Service: Gastroenterology;  Laterality: N/A;   IR IMAGING GUIDED PORT INSERTION  05/03/2022   TONSILLECTOMY       Outpatient Encounter Medications as of 11/28/2023  Medication Sig Note   mirtazapine  (REMERON ) 7.5 MG tablet Take 1 tablet (7.5 mg total) by mouth at bedtime.    acetaminophen  (TYLENOL ) 500 MG tablet Take 500 mg by mouth every 6 (six) hours as needed for mild pain (pain score 1-3).    acitretin  (SORIATANE ) 25 MG capsule Take 25 mg by mouth daily.    albuterol  (VENTOLIN  HFA) 108 (90 Base) MCG/ACT inhaler TAKE 2 PUFFS BY MOUTH EVERY 6 HOURS AS NEEDED FOR WHEEZE OR SHORTNESS OF BREATH (Patient not taking: Reported on 11/14/2023)    clobetasol ointment (TEMOVATE) 0.05 % Apply 1 Application topically 2 (two) times daily. 02/15/2023: To feet   dexamethasone  (DECADRON ) 4 MG tablet Take 2 tablets (8 mg total) by mouth daily. Start on the day after zolbetuximab for 3 days. Take with food.    enoxaparin  (LOVENOX ) 40 MG/0.4ML injection Inject 0.4 mLs (40 mg total) into the skin daily.    fluticasone  (FLONASE ) 50 MCG/ACT nasal spray Place 1 spray into both nostrils daily as needed for allergies or rhinitis. (Patient not taking: Reported on 11/14/2023)  fluticasone  furoate-vilanterol (BREO ELLIPTA ) 100-25 MCG/ACT AEPB Inhale 1 puff into the lungs daily. (Patient not taking: Reported on 11/14/2023)    gabapentin  (NEURONTIN ) 300 MG capsule Take 1 capsule (300 mg total) by mouth at bedtime.    hydrOXYzine  (ATARAX ) 25 MG tablet Take 25 mg by mouth 3 (three) times daily as needed for itching.    levothyroxine  (SYNTHROID ) 112 MCG tablet Take 112 mcg by mouth daily before breakfast.    lidocaine -prilocaine  (EMLA ) cream Apply 1 Application topically as needed (Apply 1  hour before coming to [facility] for accessing).    loratadine (CLARITIN) 10 MG tablet Take 10 mg by mouth daily. (Patient not taking: Reported on 11/14/2023)    OLANZapine  (ZYPREXA ) 5 MG tablet Take 1 tablet (5 mg total) by mouth at bedtime. Start on the day after zolbetuximab for 3 days.    ondansetron  (ZOFRAN ) 8 MG tablet Take 1 tablet (8 mg total) by mouth every 8 (eight) hours as needed for nausea or vomiting.    oxyCODONE  (OXY IR/ROXICODONE ) 5 MG immediate release tablet Take 0.5-1 tablets (2.5-5 mg total) by mouth every 6 (six) hours as needed for severe pain (pain score 7-10).    pantoprazole  (PROTONIX ) 40 MG tablet Take 1 tablet (40 mg total) by mouth 2 (two) times daily.    polyethylene glycol (MIRALAX  / GLYCOLAX ) 17 g packet Take 17 g by mouth every evening.    potassium chloride  (KLOR-CON  M10) 10 MEQ tablet Take 2 tablets (20 mEq total) by mouth daily.    prochlorperazine  (COMPAZINE ) 10 MG tablet Take 1 tablet (10 mg total) by mouth every 6 (six) hours as needed for nausea or vomiting.    triamcinolone  (KENALOG ) 0.1 % Apply 1 application  topically daily as needed (lichen planus flare).    urea (CARMOL) 40 % CREA Apply 1 Application topically as needed.    vancomycin  (VANCOCIN ) 125 MG capsule Take 1 capsule (125 mg total) by mouth 4 (four) times daily.    [DISCONTINUED] prochlorperazine  (COMPAZINE ) 10 MG tablet Take 1 tablet (10 mg total) by mouth every 6 (six) hours as needed for nausea or vomiting.    No facility-administered encounter medications on file as of 11/28/2023.     Today's Vitals   11/28/23 0812 11/28/23 0813 11/28/23 0815  BP: 93/68 90/68   Pulse: 88    Resp: 18    Temp: 97.8 F (36.6 C)    TempSrc: Temporal    SpO2: 100%    Weight: 205 lb 3.2 oz (93.1 kg)    Height: 6' (1.829 m)    PainSc:   0-No pain   Body mass index is 27.83 kg/m.   ECOG PERFORMANCE STATUS: 1 - Symptomatic but completely ambulatory  PHYSICAL EXAM GENERAL:alert, no distress and  comfortable SKIN: no rash.  Ecchymoses over the forearms and hands EYES: sclera clear NECK: without mass LYMPH:  no palpable cervical or supraclavicular lymphadenopathy  LUNGS: clear with normal breathing effort HEART: regular rate & rhythm, no lower extremity edema ABDOMEN: abdomen soft, non-tender and normal bowel sounds NEURO: alert & oriented x 3 with fluent speech, no focal motor/sensory deficits PAC without erythema    CBC    Latest Ref Rng & Units 11/28/2023    8:00 AM 11/22/2023    3:22 PM 11/14/2023    8:10 AM  CBC  WBC 4.0 - 10.5 K/uL 7.1  9.4  8.1   Hemoglobin 13.0 - 17.0 g/dL 88.9  88.7  88.9   Hematocrit 39.0 - 52.0 %  33.7  35.6  35.7   Platelets 150 - 400 K/uL 157  196  256       CMP     Latest Ref Rng & Units 11/28/2023    8:00 AM 11/22/2023    3:22 PM 11/14/2023    8:10 AM  CMP  Glucose 70 - 99 mg/dL 898  877  896   BUN 8 - 23 mg/dL 14  23  16    Creatinine 0.61 - 1.24 mg/dL 9.10  9.21  9.06   Sodium 135 - 145 mmol/L 139  139  140   Potassium 3.5 - 5.1 mmol/L 3.9  4.2  3.9   Chloride 98 - 111 mmol/L 105  105  105   CO2 22 - 32 mmol/L 25  23  24    Calcium  8.9 - 10.3 mg/dL 8.9  8.3  9.6   Total Protein 6.5 - 8.1 g/dL 5.8  5.7  6.9   Total Bilirubin 0.0 - 1.2 mg/dL 0.8  0.5  0.6   Alkaline Phos 38 - 126 U/L 324  200  642   AST 15 - 41 U/L 69  16  39   ALT 0 - 44 U/L 32  12  31       ASSESSMENT & PLAN:  Gastric cancer with CT evidence of abdominal carcinomatosis CT abdomen/pelvis 04/13/2022-ascites, diffuse omental and peritoneal nodularity, possible circumferential mass in the transverse colon, new small left greater than right pleural effusions CT abdominal mass biopsy 04/24/2022-metastatic poorly differentiated adenocarcinoma with very focal signet ring cell features CT paracentesis 04/24/2022-no malignant cells identified Colonoscopy 04/25/2022-diverticulosis in the sigmoid colon.  Internal hemorrhoids.  No specimens collected. Upper endoscopy  04/25/2022-large infiltrative mass with oozing bleeding and stigmata of recent bleeding in the gastric fundus, on the anterior wall of the gastric body and on the greater curvature of the gastric body; deformity in the gastric antrum.  Extrinsic deformity in the entire duodenum.  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 Her2 by IHC negative, 1+; MMR IHC normal; PD-L1 CPS 1%, positive. Claudin 18 positive, 100% Cycle 1 FOLFOX 05/04/2022 Cycle 2 FOLFOX 05/17/2022 Cycle 3 FOLFOX 05/31/2022-oxaliplatin  dose reduced secondary to prolonged cold sensitivity Cycle 4 FOLFOX 06/14/2022 Cycle 5 FOLFOX 06/28/2022 CT abdomen/pelvis 07/08/2022-asymmetric wall thickening at the lesser curvature of the stomach, decreased ascites, increased diffuse omental and peritoneal nodularity Cycle 6 FOLFOX 07/12/2022 Cycle 7 FOLFOX 08/09/2022 Cycle 8 FOLFOX 08/23/2022 Cycle 9 FOLFOX 09/06/2022 CTs 09/11/2022-stable, no new or progressive interval findings Cycle 10 FOLFOX 09/20/2022 Xeloda  maintenance 2 weeks on/1 week off 10/16/2022 Cycle 2 Xeloda  11/06/2022-Xeloda  dose reduced secondary to early foot skin toxicity Cycle 3 Xeloda  11/27/2022 Cycle 4 Xeloda  12/18/2022 CTs 01/02/2023-wall thickening of the mid gastric body-possibly progressive, unchanged peritoneal/omental caking, unchanged L1 compression fracture Cycle 5 Xeloda  beginning 01/08/2023 Cycle 6 Xeloda  beginning 01/29/2023 Cycle 7 Xeloda  beginning 02/19/2023 Cycle 8 Xeloda  beginning 03/12/2023 Cycle 9 Xeloda  beginning 04/09/2023 Cycle 10 Xeloda  beginning 05/07/2023 CTs 05/21/2023: Unchanged wall thickening of the gastric body, slight increase in small volume ascites, diffuse peritoneal thickening and stranding of the omentum, no evidence of metastatic disease to the chest Cycle 11 Xeloda  beginning 05/28/2023 Cycle 12 Xeloda  beginning 06/18/2023 Cycle 13 Xeloda  beginning 07/09/2023 Cycle 14 Xeloda  beginning 07/30/2023 CTA abdomen/pelvis  08/14/2023: Large volume of heterogenous material in the gastric lumen-food/blood, small volume ascites with diffuse peritoneal thickening and matting of the bowel/omentum, enhancement of the gastric body 08/18/2023: EGD-large mass in the gastric fundus/body with no bleeding,  clean-based ulcers overlying 1 area of the mass, normal duodenum Cycle 15 Xeloda  09/03/2023 Cycle 16 Xeloda  09/24/2023 CTs 10/31/2023-moderate gastric body wall thickening similar.  Ill-definition of soft tissue planes in the lesser sac and porta hepatis new or progressive.  Development of intrahepatic duct dilatation.  Increase in small volume abdominal ascites with persistent peritoneal metastasis.  New trace left pleural fluid.  New T8 heterogeneous sclerosis and vertebral body height loss. Cycle 1 FOLFOX plus zolbetuximab beginning 11/14/2023 (zolbetuximab 11/14/2023, FOLFOX 11/15/2023) Cycle 2 FOLFOX plus zolbetuximab (zolbetuximab 11/28/23, FOLFOX anticipated 11/29/23) Truncal rash-status post recent punch biopsy with pathology pending; biopsy reported negative per family 04/26/2022 Lichen planus of the feet Hypothyroidism Atrial fibrillation Admission 07/24/2022 with acute bilateral pulmonary embolism/right heart strain Heparin  anticoagulation 07/24/2022 Doppler 07/24/2022-acute DVT of the right femoral, popliteal, posterior tibial, and peroneal veins, acute left posterior tibial DVT Lovenox  anticoagulation, changed to once daily dosing 05/25/2023 Lovenox  changed to 40 mg daily for 2425 7.  Anemia secondary to chemotherapy, phlebotomy, and hemoptysis-improved 8.  L1 compression fracture on plain x-ray 12/18/2022 MRI lumbar spine 02/02/2023-acute/subacute L1 compression fracture, mild degenerative change of the lumbar spine 9.  Admission 08/14/2023 with hematemesis 10. C. Difficile positive by PCR-completed course of Vancomycin     Disposition:  Mr. Ledwell appears stable. S/p cycle 1 Zolbetuximab/FOLFOX. He experienced nausea during  zolbetuximab that did not persist. He tolerated treatment well overall. PS remains adequate. There is no clinical evidence of disease progression.  He continues to loose weight, has started TPN cycled over 18 hours daily, advancing to goal. He will follow up with dietician today. Refeeding labs are normal, will continue to check with each visit while on TPN. He has mild loose stools. We reviewed symptom management and hydration status. He understands TPN will be off for FOLFOX 7/24-7/26. He will receive IVF today during the 2 hour observation period and again 7/26 with pump d/c.   He has difficulty sleeping at night. Daughter mentioned trazadone. I suggested trial of mirtazapine , reviewed risk/benefit and side effects; pt agrees to start.   Labs reviewed. Proceed with C2 zolbetuximab today, FOLFOX 7/24 - 7/26 and IVF. Follow up and cycle 3 in 2 weeks, or sooner if needed.   Patient seen with Dr. Cloretta.    Orders Placed This Encounter  Procedures   Magnesium     Standing Status:   Future    Number of Occurrences:   1    Expiration Date:   11/27/2024   Phosphorus    Standing Status:   Future    Number of Occurrences:   1    Expiration Date:   11/27/2024   Magnesium     Standing Status:   Standing    Number of Occurrences:   5    Expiration Date:   11/27/2024   Phosphorus    Standing Status:   Standing    Number of Occurrences:   5    Expiration Date:   11/27/2024      All questions were answered. The patient knows to call the clinic with any problems, questions or concerns. No barriers to learning were detected.   Jeneal Vogl K Phyllis Whitefield, NP 11/28/2023  This was a shared visit with Masayuki Sakai.  Mr. Mirsky has metastatic gastric cancer.  He began treatment with zolbetuximab/FOLFOX on 11/14/2023.  He developed abdominal pain during the initial zolbetuximab infusion.  He tolerated FOLFOX chemotherapy well.  No nausea/vomiting, mouth sores, diarrhea, or neuropathy symptoms.  Mr. Weathington has limited  oral intake.  He  began TPN a few days ago.  He will complete cycle 2 zolbetuximab/FOLFOX beginning today.  TPN will be held during the chemotherapy infusion.  Mr. Pienta will return for an office visit and chemotherapy in 2 weeks.  Arvella Hof, MD

## 2023-11-28 NOTE — Patient Instructions (Signed)
 [facility] - A DEPT OF [facility]  Discharge Instructions: Thank you for choosing [facility] to provide your oncology and hematology care.   If you have a lab appointment with the Cancer Center, please go directly to the Cancer Center and check in at the registration area.   Wear comfortable clothing and clothing appropriate for easy access to any Portacath or PICC line.   We strive to give you quality time with your provider. You may need to reschedule your appointment if you arrive late (15 or more minutes).  Arriving late affects you and other patients whose appointments are after yours.  Also, if you miss three or more appointments without notifying the office, you may be dismissed from the clinic at the provider's discretion.      For prescription refill requests, have your pharmacy contact our office and allow 72 hours for refills to be completed.    Today you received the following chemotherapy and/or immunotherapy agents Vyloy .  Zolbetuximab Injection What is this medication? ZOLBETUXIMAB (ZOL be TUX i mab) treats stomach cancer. It works by blocking a protein that causes cancer cells to grow and multiply. This helps to slow or stop the spread of cancer cells. It is a monoclonal antibody. This medicine may be used for other purposes; ask your health care provider or pharmacist if you have questions. COMMON BRAND NAME(S): Vyloy  What should I tell my care team before I take this medication? They need to know if you have any of these conditions: Nausea or vomiting An unusual or allergic reaction to zolbetuximab, other medications, foods, dyes, or preservatives Pregnant or trying to get pregnant Breastfeeding How should I use this medication? This medication is infused into a vein. It is given by your care team in a hospital or clinic setting. Talk to your care team about the use of this medication in children. Special care may be  needed. Overdosage: If you think you have taken too much of this medicine contact a poison control center or emergency room at once. NOTE: This medicine is only for you. Do not share this medicine with others. What if I miss a dose? Keep appointments for follow-up doses. It is important not to miss your dose. Call your care team if you are unable to keep an appointment. What may interact with this medication? Interactions have not been studied. This list may not describe all possible interactions. Give your health care provider a list of all the medicines, herbs, non-prescription drugs, or dietary supplements you use. Also tell them if you smoke, drink alcohol , or use illegal drugs. Some items may interact with your medicine. What should I watch for while using this medication? Your condition will be monitored carefully while you are receiving this medication. You may need blood work done while you are taking this medication. This medication can cause serious infusion reactions. To reduce the risk, your care team may give you other medications to take before receiving this one. Follow the directions from your care team. Do not breastfeed while taking this medication and for 8 months after the last dose. What side effects may I notice from receiving this medication? Side effects that you should report to your care team as soon as possible: Allergic reactions--skin rash, itching, hives, swelling of the face, lips, tongue, or throat Infusion reactions--chest pain, shortness of breath or trouble breathing, feeling faint or lightheaded Side effects that usually do not require medical attention (report these  to your care team if they continue or are bothersome): Diarrhea Fatigue Loss of appetite with weight loss Nausea Pain, tingling, or numbness in the hands or feet Swelling of the ankles, hands, or feet Vomiting This list may not describe all possible side effects. Call your doctor for medical  advice about side effects. You may report side effects to FDA at 1-800-FDA-1088. Where should I keep my medication? This medication is given in a hospital or clinic. It will not be stored at home. NOTE: This sheet is a summary. It may not cover all possible information. If you have questions about this medicine, talk to your doctor, pharmacist, or health care provider.  2025 Elsevier/Gold Standard (2023-03-28 00:00:00)   To help prevent nausea and vomiting after your treatment, we encourage you to take your nausea medication as directed.  BELOW ARE SYMPTOMS THAT SHOULD BE REPORTED IMMEDIATELY: *FEVER GREATER THAN 100.4 F (38 C) OR HIGHER *CHILLS OR SWEATING *NAUSEA AND VOMITING THAT IS NOT CONTROLLED WITH YOUR NAUSEA MEDICATION *UNUSUAL SHORTNESS OF BREATH *UNUSUAL BRUISING OR BLEEDING *URINARY PROBLEMS (pain or burning when urinating, or frequent urination) *BOWEL PROBLEMS (unusual diarrhea, constipation, pain near the anus) TENDERNESS IN MOUTH AND THROAT WITH OR WITHOUT PRESENCE OF ULCERS (sore throat, sores in mouth, or a toothache) UNUSUAL RASH, SWELLING OR PAIN  UNUSUAL VAGINAL DISCHARGE OR ITCHING   Items with * indicate a potential emergency and should be followed up as soon as possible or go to the Emergency Department if any problems should occur.  Please show the CHEMOTHERAPY ALERT CARD or IMMUNOTHERAPY ALERT CARD at check-in to the Emergency Department and triage nurse.  Should you have questions after your visit or need to cancel or reschedule your appointment, please contact [facility] - A DEPT OF [facility]  Dept: [phone]  and follow the prompts.  Office hours are 8:00 a.m. to 4:30 p.m. Monday - Friday. Please note that voicemails left after 4:00 p.m. may not be returned until the following business day.  We are closed weekends and major holidays. You have access to a nurse at all times for urgent questions. Please call the main number  to the clinic Dept: [phone] and follow the prompts.   For any non-urgent questions, you may also contact your provider using MyChart. We now offer e-Visits for anyone 74 and older to request care online for non-urgent symptoms. For details visit [email or web address].   Also download the MyChart app! Go to the app store, search MyChart, open the app, select [place], and log in with your MyChart username and password.

## 2023-11-28 NOTE — Progress Notes (Signed)
 Patient seen by Lacie Burton, NP today  Vitals are within treatment parameters:90/68 Okay to treat Decreased 7 pounds Labs are within treatment parameters: Yes  Alkaline Phosphate-324 Treatment plan has been signed: Yes   Per physician team, Patient is ready for treatment and there are NO modifications to the treatment plan.

## 2023-11-29 ENCOUNTER — Encounter: Payer: Self-pay | Admitting: Oncology

## 2023-11-29 ENCOUNTER — Inpatient Hospital Stay

## 2023-11-29 ENCOUNTER — Other Ambulatory Visit: Payer: Self-pay

## 2023-11-29 VITALS — BP 103/62 | HR 55 | Temp 97.5°F | Resp 16

## 2023-11-29 DIAGNOSIS — Z5111 Encounter for antineoplastic chemotherapy: Secondary | ICD-10-CM | POA: Diagnosis not present

## 2023-11-29 DIAGNOSIS — C169 Malignant neoplasm of stomach, unspecified: Secondary | ICD-10-CM

## 2023-11-29 MED ORDER — SODIUM CHLORIDE 0.9 % IV SOLN
4300.0000 mg | INTRAVENOUS | Status: DC
  Administered 2023-11-29: 4300 mg via INTRAVENOUS
  Filled 2023-11-29: qty 86

## 2023-11-29 MED ORDER — LEUCOVORIN CALCIUM INJECTION 350 MG
400.0000 mg/m2 | Freq: Once | INTRAVENOUS | Status: AC
  Administered 2023-11-29: 868 mg via INTRAVENOUS
  Filled 2023-11-29 (×2): qty 43.4

## 2023-11-29 MED ORDER — PROCHLORPERAZINE MALEATE 10 MG PO TABS
10.0000 mg | ORAL_TABLET | Freq: Once | ORAL | Status: AC
  Administered 2023-11-29: 10 mg via ORAL
  Filled 2023-11-29: qty 1

## 2023-11-29 MED ORDER — FLUOROURACIL CHEMO INJECTION 2.5 GM/50ML
400.0000 mg/m2 | Freq: Once | INTRAVENOUS | Status: AC
  Administered 2023-11-29: 850 mg via INTRAVENOUS
  Filled 2023-11-29: qty 17

## 2023-11-29 MED ORDER — OXALIPLATIN CHEMO INJECTION 100 MG/20ML
65.0000 mg/m2 | Freq: Once | INTRAVENOUS | Status: AC
  Administered 2023-11-29: 150 mg via INTRAVENOUS
  Filled 2023-11-29: qty 20

## 2023-11-29 MED ORDER — DEXTROSE 5 % IV SOLN
INTRAVENOUS | Status: DC
Start: 2023-11-29 — End: 2023-11-29

## 2023-11-29 MED ORDER — DEXAMETHASONE SODIUM PHOSPHATE 10 MG/ML IJ SOLN
10.0000 mg | Freq: Once | INTRAMUSCULAR | Status: AC
  Administered 2023-11-29: 10 mg via INTRAVENOUS
  Filled 2023-11-29: qty 1

## 2023-11-29 MED ORDER — SODIUM CHLORIDE 0.9 % IV SOLN: Freq: Once | INTRAVENOUS | Status: AC

## 2023-11-29 NOTE — Progress Notes (Signed)
 Per MD keep calculated dose of 4300 mg   Warren Lindahl K Sumner Kirchman, PharmD Pharmacy Resident  11/29/2023 10:49 AM

## 2023-11-29 NOTE — Patient Instructions (Signed)
 [facility] - A DEPT OF [facility]  Discharge Instructions: Thank you for choosing [facility] to provide your oncology and hematology care.   If you have a lab appointment with the Cancer Center, please go directly to the Cancer Center and check in at the registration area.   Wear comfortable clothing and clothing appropriate for easy access to any Portacath or PICC line.   We strive to give you quality time with your provider. You may need to reschedule your appointment if you arrive late (15 or more minutes).  Arriving late affects you and other patients whose appointments are after yours.  Also, if you miss three or more appointments without notifying the office, you may be dismissed from the clinic at the provider's discretion.      For prescription refill requests, have your pharmacy contact our office and allow 72 hours for refills to be completed.    Today you received the following chemotherapy and/or immunotherapy agents Oxaliplatin , Leucovorin , and Adrucil .  Oxaliplatin  Injection What is this medication? OXALIPLATIN  (ox AL i PLA tin) treats colorectal cancer. It works by slowing down the growth of cancer cells. This medicine may be used for other purposes; ask your health care provider or pharmacist if you have questions. COMMON BRAND NAME(S): Eloxatin  What should I tell my care team before I take this medication? They need to know if you have any of these conditions: Heart disease History of irregular heartbeat or rhythm Liver disease Low blood cell levels (white cells, red cells, and platelets) Lung or breathing disease, such as asthma Take medications that treat or prevent blood clots Tingling of the fingers, toes, or other nerve disorder An unusual or allergic reaction to oxaliplatin , other medications, foods, dyes, or preservatives If you or your partner are pregnant or trying to get pregnant Breast-feeding How should I use  this medication? This medication is injected into a vein. It is given by your care team in a hospital or clinic setting. Talk to your care team about the use of this medication in children. Special care may be needed. Overdosage: If you think you have taken too much of this medicine contact a poison control center or emergency room at once. NOTE: This medicine is only for you. Do not share this medicine with others. What if I miss a dose? Keep appointments for follow-up doses. It is important not to miss a dose. Call your care team if you are unable to keep an appointment. What may interact with this medication? Do not take this medication with any of the following: Cisapride Dronedarone Pimozide Thioridazine This medication may also interact with the following: Aspirin  and aspirin -like medications Certain medications that treat or prevent blood clots, such as warfarin, apixaban , dabigatran, and rivaroxaban Cisplatin Cyclosporine Diuretics Medications for infection, such as acyclovir, adefovir, amphotericin B, bacitracin, cidofovir, foscarnet, ganciclovir, gentamicin, pentamidine, vancomycin  NSAIDs, medications for pain and inflammation, such as ibuprofen or naproxen Other medications that cause heart rhythm changes Pamidronate Zoledronic acid This list may not describe all possible interactions. Give your health care provider a list of all the medicines, herbs, non-prescription drugs, or dietary supplements you use. Also tell them if you smoke, drink alcohol , or use illegal drugs. Some items may interact with your medicine. What should I watch for while using this medication? Your condition will be monitored carefully while you are receiving this medication. You may need blood work while taking this medication. This medication may make you  feel generally unwell. This is not uncommon as chemotherapy can affect healthy cells as well as cancer cells. Report any side effects. Continue your  course of treatment even though you feel ill unless your care team tells you to stop. This medication may increase your risk of getting an infection. Call your care team for advice if you get a fever, chills, sore throat, or other symptoms of a cold or flu. Do not treat yourself. Try to avoid being around people who are sick. Avoid taking medications that contain aspirin , acetaminophen , ibuprofen, naproxen, or ketoprofen unless instructed by your care team. These medications may hide a fever. Be careful brushing or flossing your teeth or using a toothpick because you may get an infection or bleed more easily. If you have any dental work done, tell your dentist you are receiving this medication. This medication can make you more sensitive to cold. Do not drink cold drinks or use ice. Cover exposed skin before coming in contact with cold temperatures or cold objects. When out in cold weather wear warm clothing and cover your mouth and nose to warm the air that goes into your lungs. Tell your care team if you get sensitive to the cold. Talk to your care team if you or your partner are pregnant or think either of you might be pregnant. This medication can cause serious birth defects if taken during pregnancy and for 9 months after the last dose. A negative pregnancy test is required before starting this medication. A reliable form of contraception is recommended while taking this medication and for 9 months after the last dose. Talk to your care team about effective forms of contraception. Do not father a child while taking this medication and for 6 months after the last dose. Use a condom while having sex during this time period. Do not breastfeed while taking this medication and for 3 months after the last dose. This medication may cause infertility. Talk to your care team if you are concerned about your fertility. What side effects may I notice from receiving this medication? Side effects that you should  report to your care team as soon as possible: Allergic reactions--skin rash, itching, hives, swelling of the face, lips, tongue, or throat Bleeding--bloody or black, tar-like stools, vomiting blood or brown material that looks like coffee grounds, red or dark brown urine, small red or purple spots on skin, unusual bruising or bleeding Dry cough, shortness of breath or trouble breathing Heart rhythm changes--fast or irregular heartbeat, dizziness, feeling faint or lightheaded, chest pain, trouble breathing Infection--fever, chills, cough, sore throat, wounds that don't heal, pain or trouble when passing urine, general feeling of discomfort or being unwell Liver injury--right upper belly pain, loss of appetite, nausea, light-colored stool, dark yellow or brown urine, yellowing skin or eyes, unusual weakness or fatigue Low red blood cell level--unusual weakness or fatigue, dizziness, headache, trouble breathing Muscle injury--unusual weakness or fatigue, muscle pain, dark yellow or brown urine, decrease in amount of urine Pain, tingling, or numbness in the hands or feet Sudden and severe headache, confusion, change in vision, seizures, which may be signs of posterior reversible encephalopathy syndrome (PRES) Unusual bruising or bleeding Side effects that usually do not require medical attention (report to your care team if they continue or are bothersome): Diarrhea Nausea Pain, redness, or swelling with sores inside the mouth or throat Unusual weakness or fatigue Vomiting This list may not describe all possible side effects. Call your doctor for medical advice about side  effects. You may report side effects to FDA at 1-800-FDA-1088. Where should I keep my medication? This medication is given in a hospital or clinic. It will not be stored at home. NOTE: This sheet is a summary. It may not cover all possible information. If you have questions about this medicine, talk to your doctor, pharmacist, or  health care provider.  2024 Elsevier/Gold Standard (2023-04-06 00:00:00)  Leucovorin  Injection What is this medication? LEUCOVORIN  (loo koe VOR in) prevents side effects from certain medications, such as methotrexate. It works by increasing folate levels. This helps protect healthy cells in your body. It may also be used to treat anemia caused by low levels of folate. It can also be used with fluorouracil , a type of chemotherapy, to treat colorectal cancer. It works by increasing the effects of fluorouracil  in the body. This medicine may be used for other purposes; ask your health care provider or pharmacist if you have questions. What should I tell my care team before I take this medication? They need to know if you have any of these conditions: Anemia from low levels of vitamin B12 in the blood An unusual or allergic reaction to leucovorin , folic acid , other medications, foods, dyes, or preservatives Pregnant or trying to get pregnant Breastfeeding How should I use this medication? This medication is injected into a vein or a muscle. It is given by your care team in a hospital or clinic setting. Talk to your care team about the use of this medication in children. Special care may be needed. Overdosage: If you think you have taken too much of this medicine contact a poison control center or emergency room at once. NOTE: This medicine is only for you. Do not share this medicine with others. What if I miss a dose? Keep appointments for follow-up doses. It is important not to miss your dose. Call your care team if you are unable to keep an appointment. What may interact with this medication? Capecitabine  Fluorouracil  Phenobarbital Phenytoin Primidone Trimethoprim;sulfamethoxazole This list may not describe all possible interactions. Give your health care provider a list of all the medicines, herbs, non-prescription drugs, or dietary supplements you use. Also tell them if you smoke, drink  alcohol , or use illegal drugs. Some items may interact with your medicine. What should I watch for while using this medication? Your condition will be monitored carefully while you are receiving this medication. This medication may increase the side effects of 5-fluorouracil . Tell your care team if you have diarrhea or mouth sores that do not get better or that get worse. What side effects may I notice from receiving this medication? Side effects that you should report to your care team as soon as possible: Allergic reactions--skin rash, itching, hives, swelling of the face, lips, tongue, or throat This list may not describe all possible side effects. Call your doctor for medical advice about side effects. You may report side effects to FDA at 1-800-FDA-1088. Where should I keep my medication? This medication is given in a hospital or clinic. It will not be stored at home. NOTE: This sheet is a summary. It may not cover all possible information. If you have questions about this medicine, talk to your doctor, pharmacist, or health care provider.  2024 Elsevier/Gold Standard (2021-09-27 00:00:00)  Fluorouracil  Injection What is this medication? FLUOROURACIL  (flure oh YOOR a sil) treats some types of cancer. It works by slowing down the growth of cancer cells. This medicine may be used for other purposes; ask your health  care provider or pharmacist if you have questions. COMMON BRAND NAME(S): Adrucil  What should I tell my care team before I take this medication? They need to know if you have any of these conditions: Blood disorders Dihydropyrimidine dehydrogenase (DPD) deficiency Infection, such as chickenpox, cold sores, herpes Kidney disease Liver disease Poor nutrition Recent or ongoing radiation therapy An unusual or allergic reaction to fluorouracil , other medications, foods, dyes, or preservatives If you or your partner are pregnant or trying to get pregnant Breast-feeding How  should I use this medication? This medication is injected into a vein. It is administered by your care team in a hospital or clinic setting. Talk to your care team about the use of this medication in children. Special care may be needed. Overdosage: If you think you have taken too much of this medicine contact a poison control center or emergency room at once. NOTE: This medicine is only for you. Do not share this medicine with others. What if I miss a dose? Keep appointments for follow-up doses. It is important not to miss your dose. Call your care team if you are unable to keep an appointment. What may interact with this medication? Do not take this medication with any of the following: Live virus vaccines This medication may also interact with the following: Medications that treat or prevent blood clots, such as warfarin, enoxaparin , dalteparin This list may not describe all possible interactions. Give your health care provider a list of all the medicines, herbs, non-prescription drugs, or dietary supplements you use. Also tell them if you smoke, drink alcohol , or use illegal drugs. Some items may interact with your medicine. What should I watch for while using this medication? Your condition will be monitored carefully while you are receiving this medication. This medication may make you feel generally unwell. This is not uncommon as chemotherapy can affect healthy cells as well as cancer cells. Report any side effects. Continue your course of treatment even though you feel ill unless your care team tells you to stop. In some cases, you may be given additional medications to help with side effects. Follow all directions for their use. This medication may increase your risk of getting an infection. Call your care team for advice if you get a fever, chills, sore throat, or other symptoms of a cold or flu. Do not treat yourself. Try to avoid being around people who are sick. This medication may  increase your risk to bruise or bleed. Call your care team if you notice any unusual bleeding. Be careful brushing or flossing your teeth or using a toothpick because you may get an infection or bleed more easily. If you have any dental work done, tell your dentist you are receiving this medication. Avoid taking medications that contain aspirin , acetaminophen , ibuprofen, naproxen, or ketoprofen unless instructed by your care team. These medications may hide a fever. Do not treat diarrhea with over the counter products. Contact your care team if you have diarrhea that lasts more than 2 days or if it is severe and watery. This medication can make you more sensitive to the sun. Keep out of the sun. If you cannot avoid being in the sun, wear protective clothing and sunscreen. Do not use sun lamps, tanning beds, or tanning booths. Talk to your care team if you or your partner wish to become pregnant or think you might be pregnant. This medication can cause serious birth defects if taken during pregnancy and for 3 months after the last dose.  A reliable form of contraception is recommended while taking this medication and for 3 months after the last dose. Talk to your care team about effective forms of contraception. Do not father a child while taking this medication and for 3 months after the last dose. Use a condom while having sex during this time period. Do not breastfeed while taking this medication. This medication may cause infertility. Talk to your care team if you are concerned about your fertility. What side effects may I notice from receiving this medication? Side effects that you should report to your care team as soon as possible: Allergic reactions--skin rash, itching, hives, swelling of the face, lips, tongue, or throat Heart attack--pain or tightness in the chest, shoulders, arms, or jaw, nausea, shortness of breath, cold or clammy skin, feeling faint or lightheaded Heart failure--shortness of  breath, swelling of the ankles, feet, or hands, sudden weight gain, unusual weakness or fatigue Heart rhythm changes--fast or irregular heartbeat, dizziness, feeling faint or lightheaded, chest pain, trouble breathing High ammonia level--unusual weakness or fatigue, confusion, loss of appetite, nausea, vomiting, seizures Infection--fever, chills, cough, sore throat, wounds that don't heal, pain or trouble when passing urine, general feeling of discomfort or being unwell Low red blood cell level--unusual weakness or fatigue, dizziness, headache, trouble breathing Pain, tingling, or numbness in the hands or feet, muscle weakness, change in vision, confusion or trouble speaking, loss of balance or coordination, trouble walking, seizures Redness, swelling, and blistering of the skin over hands and feet Severe or prolonged diarrhea Unusual bruising or bleeding Side effects that usually do not require medical attention (report to your care team if they continue or are bothersome): Dry skin Headache Increased tears Nausea Pain, redness, or swelling with sores inside the mouth or throat Sensitivity to light Vomiting This list may not describe all possible side effects. Call your doctor for medical advice about side effects. You may report side effects to FDA at 1-800-FDA-1088. Where should I keep my medication? This medication is given in a hospital or clinic. It will not be stored at home. NOTE: This sheet is a summary. It may not cover all possible information. If you have questions about this medicine, talk to your doctor, pharmacist, or health care provider.  2024 Elsevier/Gold Standard (2021-08-30 00:00:00)   To help prevent nausea and vomiting after your treatment, we encourage you to take your nausea medication as directed.  BELOW ARE SYMPTOMS THAT SHOULD BE REPORTED IMMEDIATELY: *FEVER GREATER THAN 100.4 F (38 C) OR HIGHER *CHILLS OR SWEATING *NAUSEA AND VOMITING THAT IS NOT CONTROLLED  WITH YOUR NAUSEA MEDICATION *UNUSUAL SHORTNESS OF BREATH *UNUSUAL BRUISING OR BLEEDING *URINARY PROBLEMS (pain or burning when urinating, or frequent urination) *BOWEL PROBLEMS (unusual diarrhea, constipation, pain near the anus) TENDERNESS IN MOUTH AND THROAT WITH OR WITHOUT PRESENCE OF ULCERS (sore throat, sores in mouth, or a toothache) UNUSUAL RASH, SWELLING OR PAIN  UNUSUAL VAGINAL DISCHARGE OR ITCHING   Items with * indicate a potential emergency and should be followed up as soon as possible or go to the Emergency Department if any problems should occur.  Please show the CHEMOTHERAPY ALERT CARD or IMMUNOTHERAPY ALERT CARD at check-in to the Emergency Department and triage nurse.  Should you have questions after your visit or need to cancel or reschedule your appointment, please contact [facility] - A DEPT OF [facility]  Dept: [phone]  and follow the prompts.  Office hours are 8:00 a.m. to 4:30 p.m. Monday -  Friday. Please note that voicemails left after 4:00 p.m. may not be returned until the following business day.  We are closed weekends and major holidays. You have access to a nurse at all times for urgent questions. Please call the main number to the clinic Dept: [phone] and follow the prompts.   For any non-urgent questions, you may also contact your provider using MyChart. We now offer e-Visits for anyone 59 and older to request care online for non-urgent symptoms. For details visit [email or web address].   Also download the MyChart app! Go to the app store, search MyChart, open the app, select [place], and log in with your MyChart username and password.  PUMP STOP at [facility] 11/17/2023 at 11:45am

## 2023-11-30 ENCOUNTER — Encounter

## 2023-11-30 ENCOUNTER — Other Ambulatory Visit: Payer: Self-pay | Admitting: Nurse Practitioner

## 2023-11-30 ENCOUNTER — Telehealth: Payer: Self-pay | Admitting: Nurse Practitioner

## 2023-11-30 DIAGNOSIS — C169 Malignant neoplasm of stomach, unspecified: Secondary | ICD-10-CM

## 2023-11-30 NOTE — Telephone Encounter (Signed)
 At [name] request I contacted his daughter to review Dr. Andriette recommendation for no PICC line at this time.  We will coordinate TPN and chemotherapy administration the week he has treatment.  He can receive additional IV fluids during the time TPN will not be infusing because of the chemotherapy pump.  She agrees with this plan.  I also spoke with Holley Herring.

## 2023-11-30 NOTE — Telephone Encounter (Signed)
 Todd Harrison is inquiring about PICC line placement so TPN will not be interrupted during chemotherapy.  Dr. Cloretta recommends against placement of another central catheter due to infection risk.

## 2023-12-01 ENCOUNTER — Inpatient Hospital Stay

## 2023-12-01 VITALS — BP 94/60 | HR 66 | Temp 97.9°F | Resp 14

## 2023-12-01 DIAGNOSIS — Z5111 Encounter for antineoplastic chemotherapy: Secondary | ICD-10-CM | POA: Diagnosis not present

## 2023-12-01 DIAGNOSIS — C169 Malignant neoplasm of stomach, unspecified: Secondary | ICD-10-CM

## 2023-12-01 MED ORDER — SODIUM CHLORIDE 0.9 % IV SOLN: Freq: Once | INTRAVENOUS | Status: AC

## 2023-12-01 MED ORDER — HEPARIN SOD (PORK) LOCK FLUSH 100 UNIT/ML IV SOLN
500.0000 [IU] | Freq: Once | INTRAVENOUS | Status: DC | PRN
Start: 2023-12-01 — End: 2023-12-01

## 2023-12-01 MED ORDER — SODIUM CHLORIDE 0.9% FLUSH
10.0000 mL | INTRAVENOUS | Status: DC | PRN
  Administered 2023-12-01: 10 mL

## 2023-12-01 NOTE — Progress Notes (Signed)
 [facility] patient here for chemo pump disconnected and to receive  IVF.   Right chest PAC dressing  C,D, & I.   Biopatch intact.  Pt tolerated IVF without difficulty.  Per wife,  pt to start receiving home cyclic  TNA today  6p - 12a.   Port flushed only with normal saline post IVF.  Wife understood to flush port post TNA with heparin . Pt was stable at discharge via self ambulation with steady gait and wife.

## 2023-12-01 NOTE — Patient Instructions (Signed)

## 2023-12-07 ENCOUNTER — Telehealth: Payer: Self-pay | Admitting: *Deleted

## 2023-12-07 ENCOUNTER — Other Ambulatory Visit: Payer: Self-pay | Admitting: Nurse Practitioner

## 2023-12-07 ENCOUNTER — Encounter: Payer: Self-pay | Admitting: Oncology

## 2023-12-07 DIAGNOSIS — Z789 Other specified health status: Secondary | ICD-10-CM

## 2023-12-07 DIAGNOSIS — C169 Malignant neoplasm of stomach, unspecified: Secondary | ICD-10-CM

## 2023-12-07 NOTE — Telephone Encounter (Signed)
 Call from [place], [place] with [employer]: Requesting office change port needle and draw TPN labs every other Wednesday. Labs requested: Triglyceride, CBC/diff, CMP, Mg+ , phosphorous and fax to [facility] att: Eva Pinal. OK to provide this per Olam Ned, NP.

## 2023-12-08 ENCOUNTER — Other Ambulatory Visit: Payer: Self-pay | Admitting: Oncology

## 2023-12-08 DIAGNOSIS — C169 Malignant neoplasm of stomach, unspecified: Secondary | ICD-10-CM

## 2023-12-12 ENCOUNTER — Inpatient Hospital Stay

## 2023-12-12 ENCOUNTER — Inpatient Hospital Stay: Attending: Nurse Practitioner

## 2023-12-12 ENCOUNTER — Other Ambulatory Visit: Payer: Self-pay

## 2023-12-12 ENCOUNTER — Encounter: Payer: Self-pay | Admitting: Nurse Practitioner

## 2023-12-12 ENCOUNTER — Telehealth: Payer: Self-pay

## 2023-12-12 ENCOUNTER — Inpatient Hospital Stay (HOSPITAL_BASED_OUTPATIENT_CLINIC_OR_DEPARTMENT_OTHER): Admitting: Nurse Practitioner

## 2023-12-12 VITALS — BP 109/69 | HR 96 | Temp 97.8°F | Resp 18 | Ht 72.0 in | Wt 207.9 lb

## 2023-12-12 DIAGNOSIS — Z86711 Personal history of pulmonary embolism: Secondary | ICD-10-CM | POA: Insufficient documentation

## 2023-12-12 DIAGNOSIS — L439 Lichen planus, unspecified: Secondary | ICD-10-CM | POA: Diagnosis not present

## 2023-12-12 DIAGNOSIS — I4891 Unspecified atrial fibrillation: Secondary | ICD-10-CM | POA: Insufficient documentation

## 2023-12-12 DIAGNOSIS — C169 Malignant neoplasm of stomach, unspecified: Secondary | ICD-10-CM | POA: Diagnosis not present

## 2023-12-12 DIAGNOSIS — R7401 Elevation of levels of liver transaminase levels: Secondary | ICD-10-CM | POA: Diagnosis not present

## 2023-12-12 DIAGNOSIS — E039 Hypothyroidism, unspecified: Secondary | ICD-10-CM | POA: Diagnosis not present

## 2023-12-12 DIAGNOSIS — Z5111 Encounter for antineoplastic chemotherapy: Secondary | ICD-10-CM | POA: Diagnosis present

## 2023-12-12 DIAGNOSIS — C786 Secondary malignant neoplasm of retroperitoneum and peritoneum: Secondary | ICD-10-CM | POA: Diagnosis not present

## 2023-12-12 DIAGNOSIS — Z86718 Personal history of other venous thrombosis and embolism: Secondary | ICD-10-CM | POA: Insufficient documentation

## 2023-12-12 DIAGNOSIS — Z7901 Long term (current) use of anticoagulants: Secondary | ICD-10-CM | POA: Diagnosis not present

## 2023-12-12 DIAGNOSIS — D6481 Anemia due to antineoplastic chemotherapy: Secondary | ICD-10-CM | POA: Diagnosis not present

## 2023-12-12 LAB — CBC WITH DIFFERENTIAL (CANCER CENTER ONLY)
Abs Immature Granulocytes: 0.02 K/uL (ref 0.00–0.07)
Basophils Absolute: 0.1 K/uL (ref 0.0–0.1)
Basophils Relative: 1 %
Eosinophils Absolute: 0.2 K/uL (ref 0.0–0.5)
Eosinophils Relative: 2 %
HCT: 36.1 % — ABNORMAL LOW (ref 39.0–52.0)
Hemoglobin: 11.6 g/dL — ABNORMAL LOW (ref 13.0–17.0)
Immature Granulocytes: 0 %
Lymphocytes Relative: 21 %
Lymphs Abs: 1.6 K/uL (ref 0.7–4.0)
MCH: 28.6 pg (ref 26.0–34.0)
MCHC: 32.1 g/dL (ref 30.0–36.0)
MCV: 89.1 fL (ref 80.0–100.0)
Monocytes Absolute: 0.7 K/uL (ref 0.1–1.0)
Monocytes Relative: 9 %
Neutro Abs: 5 K/uL (ref 1.7–7.7)
Neutrophils Relative %: 67 %
Platelet Count: 139 K/uL — ABNORMAL LOW (ref 150–400)
RBC: 4.05 MIL/uL — ABNORMAL LOW (ref 4.22–5.81)
RDW: 21.1 % — ABNORMAL HIGH (ref 11.5–15.5)
WBC Count: 7.6 K/uL (ref 4.0–10.5)
nRBC: 0 % (ref 0.0–0.2)

## 2023-12-12 LAB — CMP (CANCER CENTER ONLY)
ALT: 22 U/L (ref 0–44)
AST: 37 U/L (ref 15–41)
Albumin: 3.2 g/dL — ABNORMAL LOW (ref 3.5–5.0)
Alkaline Phosphatase: 158 U/L — ABNORMAL HIGH (ref 38–126)
Anion gap: 11 (ref 5–15)
BUN: 24 mg/dL — ABNORMAL HIGH (ref 8–23)
CO2: 29 mmol/L (ref 22–32)
Calcium: 8.9 mg/dL (ref 8.9–10.3)
Chloride: 100 mmol/L (ref 98–111)
Creatinine: 0.79 mg/dL (ref 0.61–1.24)
GFR, Estimated: >60 mL/min (ref >60)
Glucose, Bld: 121 mg/dL — ABNORMAL HIGH (ref 70–99)
Potassium: 3.6 mmol/L (ref 3.5–5.1)
Sodium: 139 mmol/L (ref 135–145)
Total Bilirubin: 0.6 mg/dL (ref 0.0–1.2)
Total Protein: 5.4 g/dL — ABNORMAL LOW (ref 6.5–8.1)

## 2023-12-12 LAB — MAGNESIUM: Magnesium: 2.1 mg/dL (ref 1.7–2.4)

## 2023-12-12 LAB — PHOSPHORUS: Phosphorus: 3.1 mg/dL (ref 2.5–4.6)

## 2023-12-12 MED ORDER — ONDANSETRON HCL 8 MG PO TABS
8.0000 mg | ORAL_TABLET | Freq: Once | ORAL | Status: AC
  Administered 2023-12-12: 8 mg via ORAL

## 2023-12-12 NOTE — Progress Notes (Signed)
 [facility] OFFICE PROGRESS NOTE   Diagnosis: Gastric cancer  INTERVAL HISTORY:   Todd Harrison returns as scheduled.  He completed cycle 2 FOLFOX plus zolbetuximab 11/28/2023 and 11/29/2023.  He had mild nausea.  No mouth sores.  He tends to have 1 small volume loose stool each morning.  He estimates every 2 to 3 days he vomits gastric contents.  He did not experience significant cold sensitivity.  He reports a recent cough.  At times the cough causes him to vomit.  No fever.  No shortness of breath.  Objective:  Vital signs in last 24 hours:  Blood pressure 109/69, pulse 96, temperature 97.8 F (36.6 C), temperature source Temporal, resp. rate 18, height 6' (1.829 m), weight 207 lb 14.4 oz (94.3 kg), SpO2 100%.    HEENT: No thrush or ulcers.  Mucous membranes appear moist. Resp: Lungs clear bilaterally. Cardio: Regular rate and rhythm. GI: Firm fullness right to mid abdomen. Vascular: Trace pitting edema lower leg bilaterally right greater than left. Neuro: Alert and oriented. Port-A-Cath without erythema.  Lab Results:  Lab Results  Component Value Date   WBC 7.6 12/12/2023   HGB 11.6 (L) 12/12/2023   HCT 36.1 (L) 12/12/2023   MCV 89.1 12/12/2023   PLT 139 (L) 12/12/2023   NEUTROABS 5.0 12/12/2023    Imaging:  No results found.  Medications: I have reviewed the patient's current medications.  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  Internal hemorrhoids.  No specimens collected. Upper endoscopy  04/25/2022-large infiltrative mass with oozing bleeding and stigmata of recent bleeding in the gastric fundus, on the anterior wall of the gastric body and on the greater curvature of the gastric body; deformity in the gastric antrum.  Extrinsic deformity in the entire duodenum.  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 Her2 by IHC negative, 1+; MMR IHC normal; PD-L1 CPS 1%, positive. Claudin 18 positive, 100% Cycle 1 FOLFOX 05/04/2022 Cycle 2 FOLFOX 05/17/2022 Cycle 3 FOLFOX 05/31/2022-oxaliplatin  dose reduced secondary to prolonged cold sensitivity Cycle 4 FOLFOX 06/14/2022 Cycle 5 FOLFOX 06/28/2022 CT abdomen/pelvis 07/08/2022-asymmetric wall thickening at the lesser curvature of the stomach, decreased ascites, increased diffuse omental and peritoneal nodularity Cycle 6 FOLFOX 07/12/2022 Cycle 7 FOLFOX 08/09/2022 Cycle 8 FOLFOX 08/23/2022 Cycle 9 FOLFOX 09/06/2022 CTs 09/11/2022-stable, no new or progressive interval findings Cycle 10 FOLFOX 09/20/2022 Xeloda  maintenance 2 weeks on/1 week off 10/16/2022 Cycle 2 Xeloda  11/06/2022-Xeloda  dose reduced secondary to early foot skin toxicity Cycle 3 Xeloda  11/27/2022 Cycle 4 Xeloda  12/18/2022 CTs 01/02/2023-wall thickening of the mid gastric body-possibly progressive, unchanged peritoneal/omental caking, unchanged L1 compression fracture Cycle 5 Xeloda  beginning 01/08/2023 Cycle 6 Xeloda  beginning 01/29/2023 Cycle 7 Xeloda  beginning 02/19/2023 Cycle 8 Xeloda  beginning 03/12/2023 Cycle 9 Xeloda  beginning 04/09/2023 Cycle 10 Xeloda  beginning 05/07/2023 CTs 05/21/2023: Unchanged wall thickening of the gastric body, slight increase in small volume ascites, diffuse peritoneal thickening and stranding of the omentum, no evidence of metastatic disease to the chest Cycle 11 Xeloda  beginning 05/28/2023 Cycle 12 Xeloda  beginning 06/18/2023 Cycle 13 Xeloda  beginning 07/09/2023 Cycle 14 Xeloda  beginning 07/30/2023 CTA abdomen/pelvis  08/14/2023: Large volume of heterogenous material in the gastric lumen-food/blood, small volume ascites  with diffuse peritoneal thickening and matting of the bowel/omentum, enhancement of the gastric body 08/18/2023: EGD-large mass in the gastric fundus/body with no bleeding, clean-based ulcers overlying 1 area of the mass, normal duodenum Cycle 15 Xeloda  09/03/2023 Cycle 16 Xeloda  09/24/2023 CTs 10/31/2023-moderate gastric body wall thickening similar.  Ill-definition of soft tissue planes in the lesser sac and porta hepatis new or progressive.  Development of intrahepatic duct dilatation.  Increase in small volume abdominal ascites with persistent peritoneal metastasis.  New trace left pleural fluid.  New T8 heterogeneous sclerosis and vertebral body height loss. Cycle 1 FOLFOX plus zolbetuximab beginning 11/14/2023 (zolbetuximab 11/14/2023, FOLFOX 11/15/2023) Cycle 2 FOLFOX plus zolbetuximab (zolbetuximab 11/28/23, FOLFOX anticipated 11/29/23) Cycle 3 FOLFOX plus zolbetuximab 12/12/2023 Truncal rash-status post recent punch biopsy with pathology pending; biopsy reported negative per family 04/26/2022 Lichen planus of the feet Hypothyroidism Atrial fibrillation Admission 07/24/2022 with acute bilateral pulmonary embolism/right heart strain Heparin  anticoagulation 07/24/2022 Doppler 07/24/2022-acute DVT of the right femoral, popliteal, posterior tibial, and peroneal veins, acute left posterior tibial DVT Lovenox  anticoagulation, changed to once daily dosing 05/25/2023 Lovenox  changed to 40 mg daily for 2425 7.  Anemia secondary to chemotherapy, phlebotomy, and hemoptysis-improved 8.  L1 compression fracture on plain x-ray 12/18/2022 MRI lumbar spine 02/02/2023-acute/subacute L1 compression fracture, mild degenerative change of the lumbar spine 9.  Admission 08/14/2023 with hematemesis 10. C. Difficile positive by PCR-completed course of Vancomycin     Disposition: Todd Harrison appears stable.  He has completed 2  cycles of FOLFOX plus zolbetuximab.  He noted improved tolerance with cycle 2.  Plan to proceed with cycle 3 as scheduled 12/13/2023.  He will receive additional IV fluids  CBC and chemistry panel reviewed.  Labs are adequate for treatment.  He is vomiting every 2 to 3 days.  He understands to contact the office if this increases.  We discussed the possibility of early gastric outlet obstruction.  He will return for follow-up and treatment in 2 weeks.  We are available to see him sooner if needed.  Patient seen with Dr. Cloretta.  Todd Harrison ANP/GNP-BC   12/12/2023  1:52 PM  This was a shared visit with Todd Harrison.  Todd Harrison was interviewed and examined.  He has completed 2 cycles of FOLFOX/zolbetuximab.  He tolerated cycle 2 zolbetuximab with less nausea.  He is otherwise tolerated the chemotherapy well.  He continues to have nausea and intermittent emesis.  The GI symptoms are likely related to tumor involving the stomach and carcinomatosis.  He course of Zofran  helps the nausea.  He will continue Zofran .  He is maintained on home TPN.  He will be referred for a restaging CT abdomen/pelvis after 4-5 cycles of FOLFOX/zolbetuximab.  I was present for greater than 50% of today's visit.  I performed medical decision making.  Arvella Cloretta, MD

## 2023-12-12 NOTE — Telephone Encounter (Signed)
-----   Message from Olam Ned sent at 12/12/2023  3:41 PM EDT ----- Please call him-he can stop potassium since he is on TPN.

## 2023-12-12 NOTE — Progress Notes (Signed)
 Orders placed for 1 liter NS over 2 hours, patient will receive tomorrow, given Zofran  8mg  PO x1 while in clinic today per provider.

## 2023-12-12 NOTE — Telephone Encounter (Signed)
 Patient gave verbal understanding, no further questions at this time. Potassium was d/c.

## 2023-12-12 NOTE — Patient Instructions (Signed)

## 2023-12-13 ENCOUNTER — Inpatient Hospital Stay

## 2023-12-13 VITALS — BP 108/63 | HR 63 | Temp 98.1°F | Resp 18

## 2023-12-13 DIAGNOSIS — C169 Malignant neoplasm of stomach, unspecified: Secondary | ICD-10-CM

## 2023-12-13 DIAGNOSIS — Z5111 Encounter for antineoplastic chemotherapy: Secondary | ICD-10-CM | POA: Diagnosis not present

## 2023-12-13 MED ORDER — FLUOROURACIL CHEMO INJECTION 2.5 GM/50ML
400.0000 mg/m2 | Freq: Once | INTRAVENOUS | Status: AC
  Administered 2023-12-13: 850 mg via INTRAVENOUS
  Filled 2023-12-13: qty 17

## 2023-12-13 MED ORDER — LEUCOVORIN CALCIUM INJECTION 350 MG
400.0000 mg/m2 | Freq: Once | INTRAVENOUS | Status: AC
  Administered 2023-12-13: 868 mg via INTRAVENOUS
  Filled 2023-12-13: qty 43.4

## 2023-12-13 MED ORDER — PALONOSETRON HCL INJECTION 0.25 MG/5ML
0.2500 mg | Freq: Once | INTRAVENOUS | Status: AC
  Administered 2023-12-13: 0.25 mg via INTRAVENOUS
  Filled 2023-12-13: qty 5

## 2023-12-13 MED ORDER — PROCHLORPERAZINE MALEATE 5 MG PO TABS
5.0000 mg | ORAL_TABLET | Freq: Once | ORAL | Status: AC
  Administered 2023-12-13: 5 mg via ORAL
  Filled 2023-12-13: qty 1

## 2023-12-13 MED ORDER — PROCHLORPERAZINE MALEATE 10 MG PO TABS
10.0000 mg | ORAL_TABLET | Freq: Once | ORAL | Status: DC
  Filled 2023-12-13: qty 1

## 2023-12-13 MED ORDER — OLANZAPINE 5 MG PO TABS
5.0000 mg | ORAL_TABLET | Freq: Once | ORAL | Status: AC
  Administered 2023-12-13: 5 mg via ORAL
  Filled 2023-12-13: qty 1

## 2023-12-13 MED ORDER — OXALIPLATIN CHEMO INJECTION 100 MG/20ML
65.0000 mg/m2 | Freq: Once | INTRAVENOUS | Status: AC
  Administered 2023-12-13: 150 mg via INTRAVENOUS
  Filled 2023-12-13: qty 20

## 2023-12-13 MED ORDER — APREPITANT 130 MG/18ML IV EMUL
130.0000 mg | Freq: Once | INTRAVENOUS | Status: AC
  Administered 2023-12-13: 130 mg via INTRAVENOUS
  Filled 2023-12-13: qty 18

## 2023-12-13 MED ORDER — ZOLBETUXIMAB-CLZB CHEMO 100MG/5ML IV SOLN
400.0000 mg/m2 | Freq: Once | INTRAVENOUS | Status: AC
  Administered 2023-12-13: 870 mg via INTRAVENOUS
  Filled 2023-12-13: qty 43.5

## 2023-12-13 MED ORDER — DEXAMETHASONE SODIUM PHOSPHATE 10 MG/ML IJ SOLN
10.0000 mg | Freq: Once | INTRAMUSCULAR | Status: AC
  Administered 2023-12-13: 10 mg via INTRAVENOUS
  Filled 2023-12-13: qty 1

## 2023-12-13 MED ORDER — SODIUM CHLORIDE 0.9 % IV SOLN: INTRAVENOUS | Status: AC

## 2023-12-13 MED ORDER — SODIUM CHLORIDE 0.9 % IV SOLN
2000.0000 mg/m2 | INTRAVENOUS | Status: DC
  Administered 2023-12-13: 4350 mg via INTRAVENOUS
  Filled 2023-12-13: qty 50

## 2023-12-13 MED ORDER — DEXTROSE 5 % IV SOLN: INTRAVENOUS | Status: DC

## 2023-12-13 NOTE — Patient Instructions (Signed)
 [facility] - A DEPT OF [facility]  Discharge Instructions: Thank you for choosing [facility] to provide your oncology and hematology care.   If you have a lab appointment with the Cancer Center, please go directly to the Cancer Center and check in at the registration area.   Wear comfortable clothing and clothing appropriate for easy access to any Portacath or PICC line.   We strive to give you quality time with your provider. You may need to reschedule your appointment if you arrive late (15 or more minutes).  Arriving late affects you and other patients whose appointments are after yours.  Also, if you miss three or more appointments without notifying the office, you may be dismissed from the clinic at the provider's discretion.      For prescription refill requests, have your pharmacy contact our office and allow 72 hours for refills to be completed.    Today you received the following chemotherapy and/or immunotherapy agents Vyloy , Oxaliplatin , Leucovorin , and Adrucil .  Oxaliplatin  Injection What is this medication? OXALIPLATIN  (ox AL i PLA tin) treats colorectal cancer. It works by slowing down the growth of cancer cells. This medicine may be used for other purposes; ask your health care provider or pharmacist if you have questions. COMMON BRAND NAME(S): Eloxatin  What should I tell my care team before I take this medication? They need to know if you have any of these conditions: Heart disease History of irregular heartbeat or rhythm Liver disease Low blood cell levels (white cells, red cells, and platelets) Lung or breathing disease, such as asthma Take medications that treat or prevent blood clots Tingling of the fingers, toes, or other nerve disorder An unusual or allergic reaction to oxaliplatin , other medications, foods, dyes, or preservatives If you or your partner are pregnant or trying to get pregnant Breast-feeding How  should I use this medication? This medication is injected into a vein. It is given by your care team in a hospital or clinic setting. Talk to your care team about the use of this medication in children. Special care may be needed. Overdosage: If you think you have taken too much of this medicine contact a poison control center or emergency room at once. NOTE: This medicine is only for you. Do not share this medicine with others. What if I miss a dose? Keep appointments for follow-up doses. It is important not to miss a dose. Call your care team if you are unable to keep an appointment. What may interact with this medication? Do not take this medication with any of the following: Cisapride Dronedarone Pimozide Thioridazine This medication may also interact with the following: Aspirin  and aspirin -like medications Certain medications that treat or prevent blood clots, such as warfarin, apixaban , dabigatran, and rivaroxaban Cisplatin Cyclosporine Diuretics Medications for infection, such as acyclovir, adefovir, amphotericin B, bacitracin, cidofovir, foscarnet, ganciclovir, gentamicin, pentamidine, vancomycin  NSAIDs, medications for pain and inflammation, such as ibuprofen or naproxen Other medications that cause heart rhythm changes Pamidronate Zoledronic acid This list may not describe all possible interactions. Give your health care provider a list of all the medicines, herbs, non-prescription drugs, or dietary supplements you use. Also tell them if you smoke, drink alcohol , or use illegal drugs. Some items may interact with your medicine. What should I watch for while using this medication? Your condition will be monitored carefully while you are receiving this medication. You may need blood work while taking this medication. This medication may make  you feel generally unwell. This is not uncommon as chemotherapy can affect healthy cells as well as cancer cells. Report any side effects.  Continue your course of treatment even though you feel ill unless your care team tells you to stop. This medication may increase your risk of getting an infection. Call your care team for advice if you get a fever, chills, sore throat, or other symptoms of a cold or flu. Do not treat yourself. Try to avoid being around people who are sick. Avoid taking medications that contain aspirin , acetaminophen , ibuprofen, naproxen, or ketoprofen unless instructed by your care team. These medications may hide a fever. Be careful brushing or flossing your teeth or using a toothpick because you may get an infection or bleed more easily. If you have any dental work done, tell your dentist you are receiving this medication. This medication can make you more sensitive to cold. Do not drink cold drinks or use ice. Cover exposed skin before coming in contact with cold temperatures or cold objects. When out in cold weather wear warm clothing and cover your mouth and nose to warm the air that goes into your lungs. Tell your care team if you get sensitive to the cold. Talk to your care team if you or your partner are pregnant or think either of you might be pregnant. This medication can cause serious birth defects if taken during pregnancy and for 9 months after the last dose. A negative pregnancy test is required before starting this medication. A reliable form of contraception is recommended while taking this medication and for 9 months after the last dose. Talk to your care team about effective forms of contraception. Do not father a child while taking this medication and for 6 months after the last dose. Use a condom while having sex during this time period. Do not breastfeed while taking this medication and for 3 months after the last dose. This medication may cause infertility. Talk to your care team if you are concerned about your fertility. What side effects may I notice from receiving this medication? Side effects that  you should report to your care team as soon as possible: Allergic reactions--skin rash, itching, hives, swelling of the face, lips, tongue, or throat Bleeding--bloody or black, tar-like stools, vomiting blood or brown material that looks like coffee grounds, red or dark brown urine, small red or purple spots on skin, unusual bruising or bleeding Dry cough, shortness of breath or trouble breathing Heart rhythm changes--fast or irregular heartbeat, dizziness, feeling faint or lightheaded, chest pain, trouble breathing Infection--fever, chills, cough, sore throat, wounds that don't heal, pain or trouble when passing urine, general feeling of discomfort or being unwell Liver injury--right upper belly pain, loss of appetite, nausea, light-colored stool, dark yellow or brown urine, yellowing skin or eyes, unusual weakness or fatigue Low red blood cell level--unusual weakness or fatigue, dizziness, headache, trouble breathing Muscle injury--unusual weakness or fatigue, muscle pain, dark yellow or brown urine, decrease in amount of urine Pain, tingling, or numbness in the hands or feet Sudden and severe headache, confusion, change in vision, seizures, which may be signs of posterior reversible encephalopathy syndrome (PRES) Unusual bruising or bleeding Side effects that usually do not require medical attention (report to your care team if they continue or are bothersome): Diarrhea Nausea Pain, redness, or swelling with sores inside the mouth or throat Unusual weakness or fatigue Vomiting This list may not describe all possible side effects. Call your doctor for medical advice about  side effects. You may report side effects to FDA at 1-800-FDA-1088. Where should I keep my medication? This medication is given in a hospital or clinic. It will not be stored at home. NOTE: This sheet is a summary. It may not cover all possible information. If you have questions about this medicine, talk to your doctor,  pharmacist, or health care provider.  2024 Elsevier/Gold Standard (2023-04-06 00:00:00)  Leucovorin  Injection What is this medication? LEUCOVORIN  (loo koe VOR in) prevents side effects from certain medications, such as methotrexate. It works by increasing folate levels. This helps protect healthy cells in your body. It may also be used to treat anemia caused by low levels of folate. It can also be used with fluorouracil , a type of chemotherapy, to treat colorectal cancer. It works by increasing the effects of fluorouracil  in the body. This medicine may be used for other purposes; ask your health care provider or pharmacist if you have questions. What should I tell my care team before I take this medication? They need to know if you have any of these conditions: Anemia from low levels of vitamin B12 in the blood An unusual or allergic reaction to leucovorin , folic acid , other medications, foods, dyes, or preservatives Pregnant or trying to get pregnant Breastfeeding How should I use this medication? This medication is injected into a vein or a muscle. It is given by your care team in a hospital or clinic setting. Talk to your care team about the use of this medication in children. Special care may be needed. Overdosage: If you think you have taken too much of this medicine contact a poison control center or emergency room at once. NOTE: This medicine is only for you. Do not share this medicine with others. What if I miss a dose? Keep appointments for follow-up doses. It is important not to miss your dose. Call your care team if you are unable to keep an appointment. What may interact with this medication? Capecitabine  Fluorouracil  Phenobarbital Phenytoin Primidone Trimethoprim;sulfamethoxazole This list may not describe all possible interactions. Give your health care provider a list of all the medicines, herbs, non-prescription drugs, or dietary supplements you use. Also tell them if you  smoke, drink alcohol , or use illegal drugs. Some items may interact with your medicine. What should I watch for while using this medication? Your condition will be monitored carefully while you are receiving this medication. This medication may increase the side effects of 5-fluorouracil . Tell your care team if you have diarrhea or mouth sores that do not get better or that get worse. What side effects may I notice from receiving this medication? Side effects that you should report to your care team as soon as possible: Allergic reactions--skin rash, itching, hives, swelling of the face, lips, tongue, or throat This list may not describe all possible side effects. Call your doctor for medical advice about side effects. You may report side effects to FDA at 1-800-FDA-1088. Where should I keep my medication? This medication is given in a hospital or clinic. It will not be stored at home. NOTE: This sheet is a summary. It may not cover all possible information. If you have questions about this medicine, talk to your doctor, pharmacist, or health care provider.  2024 Elsevier/Gold Standard (2021-09-27 00:00:00)  Fluorouracil  Injection What is this medication? FLUOROURACIL  (flure oh YOOR a sil) treats some types of cancer. It works by slowing down the growth of cancer cells. This medicine may be used for other purposes; ask your  health care provider or pharmacist if you have questions. COMMON BRAND NAME(S): Adrucil  What should I tell my care team before I take this medication? They need to know if you have any of these conditions: Blood disorders Dihydropyrimidine dehydrogenase (DPD) deficiency Infection, such as chickenpox, cold sores, herpes Kidney disease Liver disease Poor nutrition Recent or ongoing radiation therapy An unusual or allergic reaction to fluorouracil , other medications, foods, dyes, or preservatives If you or your partner are pregnant or trying to get  pregnant Breast-feeding How should I use this medication? This medication is injected into a vein. It is administered by your care team in a hospital or clinic setting. Talk to your care team about the use of this medication in children. Special care may be needed. Overdosage: If you think you have taken too much of this medicine contact a poison control center or emergency room at once. NOTE: This medicine is only for you. Do not share this medicine with others. What if I miss a dose? Keep appointments for follow-up doses. It is important not to miss your dose. Call your care team if you are unable to keep an appointment. What may interact with this medication? Do not take this medication with any of the following: Live virus vaccines This medication may also interact with the following: Medications that treat or prevent blood clots, such as warfarin, enoxaparin , dalteparin This list may not describe all possible interactions. Give your health care provider a list of all the medicines, herbs, non-prescription drugs, or dietary supplements you use. Also tell them if you smoke, drink alcohol , or use illegal drugs. Some items may interact with your medicine. What should I watch for while using this medication? Your condition will be monitored carefully while you are receiving this medication. This medication may make you feel generally unwell. This is not uncommon as chemotherapy can affect healthy cells as well as cancer cells. Report any side effects. Continue your course of treatment even though you feel ill unless your care team tells you to stop. In some cases, you may be given additional medications to help with side effects. Follow all directions for their use. This medication may increase your risk of getting an infection. Call your care team for advice if you get a fever, chills, sore throat, or other symptoms of a cold or flu. Do not treat yourself. Try to avoid being around people who are  sick. This medication may increase your risk to bruise or bleed. Call your care team if you notice any unusual bleeding. Be careful brushing or flossing your teeth or using a toothpick because you may get an infection or bleed more easily. If you have any dental work done, tell your dentist you are receiving this medication. Avoid taking medications that contain aspirin , acetaminophen , ibuprofen, naproxen, or ketoprofen unless instructed by your care team. These medications may hide a fever. Do not treat diarrhea with over the counter products. Contact your care team if you have diarrhea that lasts more than 2 days or if it is severe and watery. This medication can make you more sensitive to the sun. Keep out of the sun. If you cannot avoid being in the sun, wear protective clothing and sunscreen. Do not use sun lamps, tanning beds, or tanning booths. Talk to your care team if you or your partner wish to become pregnant or think you might be pregnant. This medication can cause serious birth defects if taken during pregnancy and for 3 months after the last  dose. A reliable form of contraception is recommended while taking this medication and for 3 months after the last dose. Talk to your care team about effective forms of contraception. Do not father a child while taking this medication and for 3 months after the last dose. Use a condom while having sex during this time period. Do not breastfeed while taking this medication. This medication may cause infertility. Talk to your care team if you are concerned about your fertility. What side effects may I notice from receiving this medication? Side effects that you should report to your care team as soon as possible: Allergic reactions--skin rash, itching, hives, swelling of the face, lips, tongue, or throat Heart attack--pain or tightness in the chest, shoulders, arms, or jaw, nausea, shortness of breath, cold or clammy skin, feeling faint or  lightheaded Heart failure--shortness of breath, swelling of the ankles, feet, or hands, sudden weight gain, unusual weakness or fatigue Heart rhythm changes--fast or irregular heartbeat, dizziness, feeling faint or lightheaded, chest pain, trouble breathing High ammonia level--unusual weakness or fatigue, confusion, loss of appetite, nausea, vomiting, seizures Infection--fever, chills, cough, sore throat, wounds that don't heal, pain or trouble when passing urine, general feeling of discomfort or being unwell Low red blood cell level--unusual weakness or fatigue, dizziness, headache, trouble breathing Pain, tingling, or numbness in the hands or feet, muscle weakness, change in vision, confusion or trouble speaking, loss of balance or coordination, trouble walking, seizures Redness, swelling, and blistering of the skin over hands and feet Severe or prolonged diarrhea Unusual bruising or bleeding Side effects that usually do not require medical attention (report to your care team if they continue or are bothersome): Dry skin Headache Increased tears Nausea Pain, redness, or swelling with sores inside the mouth or throat Sensitivity to light Vomiting This list may not describe all possible side effects. Call your doctor for medical advice about side effects. You may report side effects to FDA at 1-800-FDA-1088. Where should I keep my medication? This medication is given in a hospital or clinic. It will not be stored at home. NOTE: This sheet is a summary. It may not cover all possible information. If you have questions about this medicine, talk to your doctor, pharmacist, or health care provider.  2024 Elsevier/Gold Standard (2021-08-30 00:00:00)   To help prevent nausea and vomiting after your treatment, we encourage you to take your nausea medication as directed.  BELOW ARE SYMPTOMS THAT SHOULD BE REPORTED IMMEDIATELY: *FEVER GREATER THAN 100.4 F (38 C) OR HIGHER *CHILLS OR  SWEATING *NAUSEA AND VOMITING THAT IS NOT CONTROLLED WITH YOUR NAUSEA MEDICATION *UNUSUAL SHORTNESS OF BREATH *UNUSUAL BRUISING OR BLEEDING *URINARY PROBLEMS (pain or burning when urinating, or frequent urination) *BOWEL PROBLEMS (unusual diarrhea, constipation, pain near the anus) TENDERNESS IN MOUTH AND THROAT WITH OR WITHOUT PRESENCE OF ULCERS (sore throat, sores in mouth, or a toothache) UNUSUAL RASH, SWELLING OR PAIN  UNUSUAL VAGINAL DISCHARGE OR ITCHING   Items with * indicate a potential emergency and should be followed up as soon as possible or go to the Emergency Department if any problems should occur.  Please show the CHEMOTHERAPY ALERT CARD or IMMUNOTHERAPY ALERT CARD at check-in to the Emergency Department and triage nurse.  Should you have questions after your visit or need to cancel or reschedule your appointment, please contact [facility] - A DEPT OF [facility]  Dept: [phone]  and follow the prompts.  Office hours are 8:00 a.m. to 4:30 p.m.  Monday - Friday. Please note that voicemails left after 4:00 p.m. may not be returned until the following business day.  We are closed weekends and major holidays. You have access to a nurse at all times for urgent questions. Please call the main number to the clinic Dept: [phone] and follow the prompts.   For any non-urgent questions, you may also contact your provider using MyChart. We now offer e-Visits for anyone 70 and older to request care online for non-urgent symptoms. For details visit [email or web address].   Also download the MyChart app! Go to the app store, search MyChart, open the app, select [place], and log in with your MyChart username and password.  PUMP STOP at [facility] 12/15/23 at 2pm.

## 2023-12-13 NOTE — Progress Notes (Signed)
 Pt got up to use the restroom at 1600 and upon returning to his infusion chair he said he was having lower back spasms. Provided warm blankets, rolled towels and warm packs to try and ease the spasms. Nothing helped. Notified Olam Ned, NP. Also repositioned pt.   Began also having neck spasms at 1620. Notified Olam Ned who wants to try stopping the oxaliplatin  to see if symptoms improve. Oxailplatin stopped as ordered.   Pt's spasms ceased at 1643. Olam Hummer ordered oxaliplatin  to be restarted at approximately half the original rate per hour. Rate decreased to 125ml/hour.   1703: Pt has not had any more spasm. Increased oxaliplatin  to original rate of 285ml/hr per order from Olam Ned, NP (secure chat). Advised opt and family member to notify me immediately if any spasm recurs.   Pt able to complete the oxaliplatin  infusion without any further spasm.   Pt's pump stop appt is on Saturday at 2pm at [facility]. It will be one to two hours short of the 46 hour infusion time. Secured order from Olam Ned, NP via secure chat that the pump may be removed at 2pm and any remaining 5FU can be wasted.

## 2023-12-14 ENCOUNTER — Other Ambulatory Visit

## 2023-12-14 ENCOUNTER — Ambulatory Visit: Admitting: Oncology

## 2023-12-14 ENCOUNTER — Ambulatory Visit

## 2023-12-15 ENCOUNTER — Inpatient Hospital Stay

## 2023-12-20 LAB — LAB REPORT - SCANNED: EGFR: 99

## 2023-12-23 ENCOUNTER — Other Ambulatory Visit: Payer: Self-pay | Admitting: Oncology

## 2023-12-23 DIAGNOSIS — C169 Malignant neoplasm of stomach, unspecified: Secondary | ICD-10-CM

## 2023-12-26 ENCOUNTER — Encounter: Payer: Self-pay | Admitting: Oncology

## 2023-12-26 ENCOUNTER — Encounter: Payer: Self-pay | Admitting: *Deleted

## 2023-12-26 ENCOUNTER — Inpatient Hospital Stay

## 2023-12-26 ENCOUNTER — Encounter: Payer: Self-pay | Admitting: Nurse Practitioner

## 2023-12-26 ENCOUNTER — Inpatient Hospital Stay (HOSPITAL_BASED_OUTPATIENT_CLINIC_OR_DEPARTMENT_OTHER): Admitting: Nurse Practitioner

## 2023-12-26 VITALS — BP 98/71 | HR 92 | Temp 98.2°F | Resp 18 | Ht 72.0 in | Wt 208.5 lb

## 2023-12-26 DIAGNOSIS — C168 Malignant neoplasm of overlapping sites of stomach: Secondary | ICD-10-CM

## 2023-12-26 DIAGNOSIS — C169 Malignant neoplasm of stomach, unspecified: Secondary | ICD-10-CM

## 2023-12-26 DIAGNOSIS — Z5111 Encounter for antineoplastic chemotherapy: Secondary | ICD-10-CM | POA: Diagnosis not present

## 2023-12-26 LAB — CMP (CANCER CENTER ONLY)
ALT: 61 U/L — ABNORMAL HIGH (ref 0–44)
AST: 71 U/L — ABNORMAL HIGH (ref 15–41)
Albumin: 3.1 g/dL — ABNORMAL LOW (ref 3.5–5.0)
Alkaline Phosphatase: 701 U/L — ABNORMAL HIGH (ref 38–126)
Anion gap: 11 (ref 5–15)
BUN: 22 mg/dL (ref 8–23)
CO2: 24 mmol/L (ref 22–32)
Calcium: 8.9 mg/dL (ref 8.9–10.3)
Chloride: 104 mmol/L (ref 98–111)
Creatinine: 0.77 mg/dL (ref 0.61–1.24)
GFR, Estimated: >60 mL/min (ref >60)
Glucose, Bld: 112 mg/dL — ABNORMAL HIGH (ref 70–99)
Potassium: 3.9 mmol/L (ref 3.5–5.1)
Sodium: 140 mmol/L (ref 135–145)
Total Bilirubin: 0.6 mg/dL (ref 0.0–1.2)
Total Protein: 5.6 g/dL — ABNORMAL LOW (ref 6.5–8.1)

## 2023-12-26 LAB — CBC WITH DIFFERENTIAL (CANCER CENTER ONLY)
Abs Immature Granulocytes: 0.01 K/uL (ref 0.00–0.07)
Basophils Absolute: 0 K/uL (ref 0.0–0.1)
Basophils Relative: 1 %
Eosinophils Absolute: 0.3 K/uL (ref 0.0–0.5)
Eosinophils Relative: 5 %
HCT: 35.4 % — ABNORMAL LOW (ref 39.0–52.0)
Hemoglobin: 11.2 g/dL — ABNORMAL LOW (ref 13.0–17.0)
Immature Granulocytes: 0 %
Lymphocytes Relative: 27 %
Lymphs Abs: 1.4 K/uL (ref 0.7–4.0)
MCH: 28.7 pg (ref 26.0–34.0)
MCHC: 31.6 g/dL (ref 30.0–36.0)
MCV: 90.8 fL (ref 80.0–100.0)
Monocytes Absolute: 0.6 K/uL (ref 0.1–1.0)
Monocytes Relative: 11 %
Neutro Abs: 2.9 K/uL (ref 1.7–7.7)
Neutrophils Relative %: 56 %
Platelet Count: 156 K/uL (ref 150–400)
RBC: 3.9 MIL/uL — ABNORMAL LOW (ref 4.22–5.81)
RDW: 20.8 % — ABNORMAL HIGH (ref 11.5–15.5)
WBC Count: 5.3 K/uL (ref 4.0–10.5)
nRBC: 0 % (ref 0.0–0.2)

## 2023-12-26 LAB — PHOSPHORUS: Phosphorus: 3.1 mg/dL (ref 2.5–4.6)

## 2023-12-26 LAB — MAGNESIUM: Magnesium: 2.2 mg/dL (ref 1.7–2.4)

## 2023-12-26 MED ORDER — PROCHLORPERAZINE MALEATE 10 MG PO TABS: 10.0000 mg | ORAL_TABLET | Freq: Four times a day (QID) | ORAL | 3 refills | Status: AC | PRN

## 2023-12-26 NOTE — Progress Notes (Signed)
 Faxed 12/26/23 labs to [facility] [phone] to manage his TPN

## 2023-12-26 NOTE — Patient Instructions (Signed)

## 2023-12-26 NOTE — Progress Notes (Unsigned)
**Todd [name]**  Todd Todd Harrison   Diagnosis: Gastric cancer  INTERVAL HISTORY:   Todd Todd Harrison returns as scheduled.  He completed cycle 3 FOLFOX plus zolbetuximab 12/12/2023.  He continues to have periodic nausea/vomiting.  Frequency may be slightly increased.  He noted no increase following chemotherapy.  No mouth sores.  He has 1 loose stool a day since beginning TPN.  No rash.  No bleeding.  He developed neck and back spasms during the oxaliplatin  infusion.  Oxaliplatin  was stopped and symptoms resolved.  Oxaliplatin  was resumed and completed with no further issues.  He denies experiencing shortness of breath, wheezing, chest pain, throat tightness, rash.  Objective:  Vital signs in last 24 hours:  Blood pressure 98/71, pulse 92, temperature 98.2 F (36.8 C), resp. rate 18, height 6' (1.829 m), weight 208 lb 8 oz (94.6 kg), SpO2 97%.    HEENT: No thrush or ulcers. Resp: Lungs clear bilaterally. Cardio: Regular rate and rhythm. GI: No hepatosplenomegaly.  Nontender. Vascular: 1+ lower leg edema bilaterally. Skin: Ecchymoses scattered over forearms. Port-A-Cath without erythema.  Lab Results:  Lab Results  Component Value Date   WBC 5.3 12/26/2023   HGB 11.2 (L) 12/26/2023   HCT 35.4 (L) 12/26/2023   MCV 90.8 12/26/2023   PLT 156 12/26/2023   NEUTROABS 2.9 12/26/2023    Imaging:  No results found.  Medications: I have reviewed the patient's current medications.  Assessment/Plan: Gastric cancer with CT evidence of abdominal carcinomatosis CT abdomen/pelvis 04/13/2022-ascites, diffuse omental and peritoneal nodularity, possible circumferential mass in the transverse colon, new small left greater than right pleural effusions CT abdominal mass biopsy 04/24/2022-metastatic poorly differentiated adenocarcinoma with very focal signet ring cell features CT paracentesis 04/24/2022-no malignant cells identified Colonoscopy 04/25/2022-diverticulosis in the sigmoid  colon.  Internal hemorrhoids.  No specimens collected. Upper endoscopy 04/25/2022-large infiltrative mass with oozing bleeding and stigmata of recent bleeding in the gastric fundus, on the anterior wall of the gastric body and on the greater curvature of the gastric body; deformity in the gastric antrum.  Extrinsic deformity in the entire duodenum.  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 Her2 by IHC negative, 1+; MMR IHC normal; PD-L1 CPS 1%, positive. Claudin 18 positive, 100% Cycle 1 FOLFOX 05/04/2022 Cycle 2 FOLFOX 05/17/2022 Cycle 3 FOLFOX 05/31/2022-oxaliplatin  dose reduced secondary to prolonged cold sensitivity Cycle 4 FOLFOX 06/14/2022 Cycle 5 FOLFOX 06/28/2022 CT abdomen/pelvis 07/08/2022-asymmetric wall thickening at the lesser curvature of the stomach, decreased ascites, increased diffuse omental and peritoneal nodularity Cycle 6 FOLFOX 07/12/2022 Cycle 7 FOLFOX 08/09/2022 Cycle 8 FOLFOX 08/23/2022 Cycle 9 FOLFOX 09/06/2022 CTs 09/11/2022-stable, no new or progressive interval findings Cycle 10 FOLFOX 09/20/2022 Xeloda  maintenance 2 weeks on/1 week off 10/16/2022 Cycle 2 Xeloda  11/06/2022-Xeloda  dose reduced secondary to early foot skin toxicity Cycle 3 Xeloda  11/27/2022 Cycle 4 Xeloda  12/18/2022 CTs 01/02/2023-wall thickening of the mid gastric body-possibly progressive, unchanged peritoneal/omental caking, unchanged L1 compression fracture Cycle 5 Xeloda  beginning 01/08/2023 Cycle 6 Xeloda  beginning 01/29/2023 Cycle 7 Xeloda  beginning 02/19/2023 Cycle 8 Xeloda  beginning 03/12/2023 Cycle 9 Xeloda  beginning 04/09/2023 Cycle 10 Xeloda  beginning 05/07/2023 CTs 05/21/2023: Unchanged wall thickening of the gastric body, slight increase in small volume ascites, diffuse peritoneal thickening and stranding of the omentum, no evidence of metastatic disease to the chest Cycle 11 Xeloda  beginning 05/28/2023 Cycle 12 Xeloda  beginning 06/18/2023 Cycle 13 Xeloda  beginning  07/09/2023 Cycle 14 Xeloda  beginning 07/30/2023 CTA abdomen/pelvis 08/14/2023: Large volume of heterogenous material in the gastric lumen-food/blood, small volume  ascites with diffuse peritoneal thickening and matting of the bowel/omentum, enhancement of the gastric body 08/18/2023: EGD-large mass in the gastric fundus/body with no bleeding, clean-based ulcers overlying 1 area of the mass, normal duodenum Cycle 15 Xeloda  09/03/2023 Cycle 16 Xeloda  09/24/2023 CTs 10/31/2023-moderate gastric body wall thickening similar.  Ill-definition of soft tissue planes in the lesser sac and porta hepatis new or progressive.  Development of intrahepatic duct dilatation.  Increase in small volume abdominal ascites with persistent peritoneal metastasis.  New trace left pleural fluid.  New T8 heterogeneous sclerosis and vertebral body height loss. Cycle 1 FOLFOX plus zolbetuximab beginning 11/14/2023 (zolbetuximab 11/14/2023, FOLFOX 11/15/2023) Cycle 2 FOLFOX plus zolbetuximab (zolbetuximab 11/28/23, FOLFOX anticipated 11/29/23) Cycle 3 FOLFOX plus zolbetuximab 12/12/2023 Cycle 4 FOLFOX plus zolbetuximab 12/27/2023 Truncal rash-status post recent punch biopsy with pathology pending; biopsy reported negative per family 04/26/2022 Lichen planus of the feet Hypothyroidism Atrial fibrillation Admission 07/24/2022 with acute bilateral pulmonary embolism/right heart strain Heparin  anticoagulation 07/24/2022 Doppler 07/24/2022-acute DVT of the right femoral, popliteal, posterior tibial, and peroneal veins, acute left posterior tibial DVT Lovenox  anticoagulation, changed to once daily dosing 05/25/2023 Lovenox  changed to 40 mg daily for 2425 7.  Anemia secondary to chemotherapy, phlebotomy, and hemoptysis-improved 8.  L1 compression fracture on plain x-ray 12/18/2022 MRI lumbar spine 02/02/2023-acute/subacute L1 compression fracture, mild degenerative change of the lumbar spine 9.  Admission 08/14/2023 with hematemesis 10. C. Difficile  positive by PCR-completed course of Vancomycin     Disposition: Todd Todd Harrison appears stable.  He has completed 3 cycles of FOLFOX plus zolbetuximab.  Overall he tolerated cycle 3 fairly well.  He developed neck and back spasms during the oxaliplatin .  The spasms resolved when oxaliplatin  was held.  Oxaliplatin  was resumed and completed with no further issues.  Question acute neurotoxicity.  Pepcid  and Benadryl  will be added to premedications.  Plan to proceed with cycle 4 FOLFOX plus zolbetuximab as scheduled 12/27/2023.  CTs after he has completed 5 cycles.  CBC and chemistry panel reviewed.  Labs adequate for treatment.  AST/ALT mildly elevated.  Question related to chemotherapy, question TPN.  Continue to monitor.  He will return for follow-up in 2 weeks.  We are available to see him sooner if needed.    Todd Todd Harrison ANP/GNP-BC   12/26/2023  2:03 PM

## 2023-12-27 ENCOUNTER — Encounter: Payer: Self-pay | Admitting: Oncology

## 2023-12-27 ENCOUNTER — Inpatient Hospital Stay

## 2023-12-27 ENCOUNTER — Other Ambulatory Visit: Payer: Self-pay | Admitting: Nurse Practitioner

## 2023-12-27 VITALS — BP 107/60 | HR 86 | Temp 98.7°F | Resp 18

## 2023-12-27 DIAGNOSIS — I2699 Other pulmonary embolism without acute cor pulmonale: Secondary | ICD-10-CM | POA: Diagnosis not present

## 2023-12-27 DIAGNOSIS — C168 Malignant neoplasm of overlapping sites of stomach: Secondary | ICD-10-CM

## 2023-12-27 DIAGNOSIS — C169 Malignant neoplasm of stomach, unspecified: Secondary | ICD-10-CM

## 2023-12-27 MED ORDER — PALONOSETRON HCL INJECTION 0.25 MG/5ML
0.2500 mg | Freq: Once | INTRAVENOUS | Status: AC
  Administered 2023-12-27: 0.25 mg via INTRAVENOUS
  Filled 2023-12-27: qty 5

## 2023-12-27 MED ORDER — LORAZEPAM 1 MG PO TABS: 0.5000 mg | ORAL_TABLET | ORAL | Status: DC | PRN

## 2023-12-27 MED ORDER — DEXAMETHASONE SODIUM PHOSPHATE 10 MG/ML IJ SOLN
10.0000 mg | Freq: Once | INTRAMUSCULAR | Status: AC
  Administered 2023-12-27: 10 mg via INTRAVENOUS
  Filled 2023-12-27: qty 1

## 2023-12-27 MED ORDER — OXALIPLATIN CHEMO INJECTION 100 MG/20ML
65.0000 mg/m2 | Freq: Once | INTRAVENOUS | Status: AC
  Administered 2023-12-27: 150 mg via INTRAVENOUS
  Filled 2023-12-27: qty 20

## 2023-12-27 MED ORDER — PROCHLORPERAZINE MALEATE 10 MG PO TABS
5.0000 mg | ORAL_TABLET | Freq: Once | ORAL | Status: AC
  Administered 2023-12-27: 5 mg via ORAL

## 2023-12-27 MED ORDER — OLANZAPINE 5 MG PO TABS
5.0000 mg | ORAL_TABLET | Freq: Once | ORAL | Status: AC
  Administered 2023-12-27: 5 mg via ORAL
  Filled 2023-12-27: qty 1

## 2023-12-27 MED ORDER — FAMOTIDINE IN NACL 20-0.9 MG/50ML-% IV SOLN
20.0000 mg | Freq: Once | INTRAVENOUS | Status: AC
  Administered 2023-12-27: 20 mg via INTRAVENOUS
  Filled 2023-12-27: qty 50

## 2023-12-27 MED ORDER — LEUCOVORIN CALCIUM INJECTION 350 MG
400.0000 mg/m2 | Freq: Once | INTRAVENOUS | Status: AC
  Administered 2023-12-27: 868 mg via INTRAVENOUS
  Filled 2023-12-27 (×2): qty 43.4

## 2023-12-27 MED ORDER — SODIUM CHLORIDE 0.9 % IV SOLN: INTRAVENOUS | Status: AC

## 2023-12-27 MED ORDER — FLUOROURACIL CHEMO INJECTION 2.5 GM/50ML
400.0000 mg/m2 | Freq: Once | INTRAVENOUS | Status: AC
  Administered 2023-12-27: 850 mg via INTRAVENOUS
  Filled 2023-12-27: qty 17

## 2023-12-27 MED ORDER — APREPITANT 130 MG/18ML IV EMUL
130.0000 mg | Freq: Once | INTRAVENOUS | Status: AC
  Administered 2023-12-27: 130 mg via INTRAVENOUS
  Filled 2023-12-27: qty 18

## 2023-12-27 MED ORDER — PROCHLORPERAZINE MALEATE 10 MG PO TABS
5.0000 mg | ORAL_TABLET | Freq: Once | ORAL | Status: AC
  Administered 2023-12-27: 5 mg via ORAL
  Filled 2023-12-27: qty 1

## 2023-12-27 MED ORDER — ZOLBETUXIMAB-CLZB CHEMO 100MG/5ML IV SOLN
400.0000 mg/m2 | Freq: Once | INTRAVENOUS | Status: AC
  Administered 2023-12-27: 870 mg via INTRAVENOUS
  Filled 2023-12-27: qty 43.5

## 2023-12-27 MED ORDER — SODIUM CHLORIDE 0.9 % IV SOLN
2000.0000 mg/m2 | INTRAVENOUS | Status: DC
  Administered 2023-12-27: 4350 mg via INTRAVENOUS
  Filled 2023-12-27: qty 87

## 2023-12-27 MED ORDER — DIPHENHYDRAMINE HCL 50 MG/ML IJ SOLN
25.0000 mg | Freq: Once | INTRAMUSCULAR | Status: AC
  Administered 2023-12-27: 25 mg via INTRAVENOUS
  Filled 2023-12-27: qty 1

## 2023-12-27 NOTE — Progress Notes (Signed)
 Pt has titrated to the max dose of Vyloy  and has tolerated it very well. Denies any side effects. Has been up to the restroom and is currently reading in the infusion chair.   1552: Pt c/o back spasms similar to last round of treatment. Oxaliplatin  stopped and Olam Ned, NP notified and saw the pt in Infusion. He is having chills and warm blankets applied.

## 2023-12-27 NOTE — Patient Instructions (Signed)
 [facility] - A DEPT OF [facility]  Discharge Instructions: Thank you for choosing [facility] to provide your oncology and hematology care.   If you have a lab appointment with the Cancer Center, please go directly to the Cancer Center and check in at the registration area.   Wear comfortable clothing and clothing appropriate for easy access to any Portacath or PICC line.   We strive to give you quality time with your provider. You may need to reschedule your appointment if you arrive late (15 or more minutes).  Arriving late affects you and other patients whose appointments are after yours.  Also, if you miss three or more appointments without notifying the office, you may be dismissed from the clinic at the provider's discretion.      For prescription refill requests, have your pharmacy contact our office and allow 72 hours for refills to be completed.    Today you received the following chemotherapy and/or immunotherapy agents Vyloy , Oxaliplatin , Leucovorin , and Adrucil .  Oxaliplatin  Injection What is this medication? OXALIPLATIN  (ox AL i PLA tin) treats colorectal cancer. It works by slowing down the growth of cancer cells. This medicine may be used for other purposes; ask your health care provider or pharmacist if you have questions. COMMON BRAND NAME(S): Eloxatin  What should I tell my care team before I take this medication? They need to know if you have any of these conditions: Heart disease History of irregular heartbeat or rhythm Liver disease Low blood cell levels (white cells, red cells, and platelets) Lung or breathing disease, such as asthma Take medications that treat or prevent blood clots Tingling of the fingers, toes, or other nerve disorder An unusual or allergic reaction to oxaliplatin , other medications, foods, dyes, or preservatives If you or your partner are pregnant or trying to get pregnant Breast-feeding How  should I use this medication? This medication is injected into a vein. It is given by your care team in a hospital or clinic setting. Talk to your care team about the use of this medication in children. Special care may be needed. Overdosage: If you think you have taken too much of this medicine contact a poison control center or emergency room at once. NOTE: This medicine is only for you. Do not share this medicine with others. What if I miss a dose? Keep appointments for follow-up doses. It is important not to miss a dose. Call your care team if you are unable to keep an appointment. What may interact with this medication? Do not take this medication with any of the following: Cisapride Dronedarone Pimozide Thioridazine This medication may also interact with the following: Aspirin  and aspirin -like medications Certain medications that treat or prevent blood clots, such as warfarin, apixaban , dabigatran, and rivaroxaban Cisplatin Cyclosporine Diuretics Medications for infection, such as acyclovir, adefovir, amphotericin B, bacitracin, cidofovir, foscarnet, ganciclovir, gentamicin, pentamidine, vancomycin  NSAIDs, medications for pain and inflammation, such as ibuprofen or naproxen Other medications that cause heart rhythm changes Pamidronate Zoledronic acid This list may not describe all possible interactions. Give your health care provider a list of all the medicines, herbs, non-prescription drugs, or dietary supplements you use. Also tell them if you smoke, drink alcohol , or use illegal drugs. Some items may interact with your medicine. What should I watch for while using this medication? Your condition will be monitored carefully while you are receiving this medication. You may need blood work while taking this medication. This medication may make  you feel generally unwell. This is not uncommon as chemotherapy can affect healthy cells as well as cancer cells. Report any side effects.  Continue your course of treatment even though you feel ill unless your care team tells you to stop. This medication may increase your risk of getting an infection. Call your care team for advice if you get a fever, chills, sore throat, or other symptoms of a cold or flu. Do not treat yourself. Try to avoid being around people who are sick. Avoid taking medications that contain aspirin , acetaminophen , ibuprofen, naproxen, or ketoprofen unless instructed by your care team. These medications may hide a fever. Be careful brushing or flossing your teeth or using a toothpick because you may get an infection or bleed more easily. If you have any dental work done, tell your dentist you are receiving this medication. This medication can make you more sensitive to cold. Do not drink cold drinks or use ice. Cover exposed skin before coming in contact with cold temperatures or cold objects. When out in cold weather wear warm clothing and cover your mouth and nose to warm the air that goes into your lungs. Tell your care team if you get sensitive to the cold. Talk to your care team if you or your partner are pregnant or think either of you might be pregnant. This medication can cause serious birth defects if taken during pregnancy and for 9 months after the last dose. A negative pregnancy test is required before starting this medication. A reliable form of contraception is recommended while taking this medication and for 9 months after the last dose. Talk to your care team about effective forms of contraception. Do not father a child while taking this medication and for 6 months after the last dose. Use a condom while having sex during this time period. Do not breastfeed while taking this medication and for 3 months after the last dose. This medication may cause infertility. Talk to your care team if you are concerned about your fertility. What side effects may I notice from receiving this medication? Side effects that  you should report to your care team as soon as possible: Allergic reactions--skin rash, itching, hives, swelling of the face, lips, tongue, or throat Bleeding--bloody or black, tar-like stools, vomiting blood or brown material that looks like coffee grounds, red or dark brown urine, small red or purple spots on skin, unusual bruising or bleeding Dry cough, shortness of breath or trouble breathing Heart rhythm changes--fast or irregular heartbeat, dizziness, feeling faint or lightheaded, chest pain, trouble breathing Infection--fever, chills, cough, sore throat, wounds that don't heal, pain or trouble when passing urine, general feeling of discomfort or being unwell Liver injury--right upper belly pain, loss of appetite, nausea, light-colored stool, dark yellow or brown urine, yellowing skin or eyes, unusual weakness or fatigue Low red blood cell level--unusual weakness or fatigue, dizziness, headache, trouble breathing Muscle injury--unusual weakness or fatigue, muscle pain, dark yellow or brown urine, decrease in amount of urine Pain, tingling, or numbness in the hands or feet Sudden and severe headache, confusion, change in vision, seizures, which may be signs of posterior reversible encephalopathy syndrome (PRES) Unusual bruising or bleeding Side effects that usually do not require medical attention (report to your care team if they continue or are bothersome): Diarrhea Nausea Pain, redness, or swelling with sores inside the mouth or throat Unusual weakness or fatigue Vomiting This list may not describe all possible side effects. Call your doctor for medical advice about  side effects. You may report side effects to FDA at 1-800-FDA-1088. Where should I keep my medication? This medication is given in a hospital or clinic. It will not be stored at home. NOTE: This sheet is a summary. It may not cover all possible information. If you have questions about this medicine, talk to your doctor,  pharmacist, or health care provider.  2024 Elsevier/Gold Standard (2023-04-06 00:00:00)  Leucovorin  Injection What is this medication? LEUCOVORIN  (loo koe VOR in) prevents side effects from certain medications, such as methotrexate. It works by increasing folate levels. This helps protect healthy cells in your body. It may also be used to treat anemia caused by low levels of folate. It can also be used with fluorouracil , a type of chemotherapy, to treat colorectal cancer. It works by increasing the effects of fluorouracil  in the body. This medicine may be used for other purposes; ask your health care provider or pharmacist if you have questions. What should I tell my care team before I take this medication? They need to know if you have any of these conditions: Anemia from low levels of vitamin B12 in the blood An unusual or allergic reaction to leucovorin , folic acid , other medications, foods, dyes, or preservatives Pregnant or trying to get pregnant Breastfeeding How should I use this medication? This medication is injected into a vein or a muscle. It is given by your care team in a hospital or clinic setting. Talk to your care team about the use of this medication in children. Special care may be needed. Overdosage: If you think you have taken too much of this medicine contact a poison control center or emergency room at once. NOTE: This medicine is only for you. Do not share this medicine with others. What if I miss a dose? Keep appointments for follow-up doses. It is important not to miss your dose. Call your care team if you are unable to keep an appointment. What may interact with this medication? Capecitabine  Fluorouracil  Phenobarbital Phenytoin Primidone Trimethoprim;sulfamethoxazole This list may not describe all possible interactions. Give your health care provider a list of all the medicines, herbs, non-prescription drugs, or dietary supplements you use. Also tell them if you  smoke, drink alcohol , or use illegal drugs. Some items may interact with your medicine. What should I watch for while using this medication? Your condition will be monitored carefully while you are receiving this medication. This medication may increase the side effects of 5-fluorouracil . Tell your care team if you have diarrhea or mouth sores that do not get better or that get worse. What side effects may I notice from receiving this medication? Side effects that you should report to your care team as soon as possible: Allergic reactions--skin rash, itching, hives, swelling of the face, lips, tongue, or throat This list may not describe all possible side effects. Call your doctor for medical advice about side effects. You may report side effects to FDA at 1-800-FDA-1088. Where should I keep my medication? This medication is given in a hospital or clinic. It will not be stored at home. NOTE: This sheet is a summary. It may not cover all possible information. If you have questions about this medicine, talk to your doctor, pharmacist, or health care provider.  2024 Elsevier/Gold Standard (2021-09-27 00:00:00)  Fluorouracil  Injection What is this medication? FLUOROURACIL  (flure oh YOOR a sil) treats some types of cancer. It works by slowing down the growth of cancer cells. This medicine may be used for other purposes; ask your  health care provider or pharmacist if you have questions. COMMON BRAND NAME(S): Adrucil  What should I tell my care team before I take this medication? They need to know if you have any of these conditions: Blood disorders Dihydropyrimidine dehydrogenase (DPD) deficiency Infection, such as chickenpox, cold sores, herpes Kidney disease Liver disease Poor nutrition Recent or ongoing radiation therapy An unusual or allergic reaction to fluorouracil , other medications, foods, dyes, or preservatives If you or your partner are pregnant or trying to get  pregnant Breast-feeding How should I use this medication? This medication is injected into a vein. It is administered by your care team in a hospital or clinic setting. Talk to your care team about the use of this medication in children. Special care may be needed. Overdosage: If you think you have taken too much of this medicine contact a poison control center or emergency room at once. NOTE: This medicine is only for you. Do not share this medicine with others. What if I miss a dose? Keep appointments for follow-up doses. It is important not to miss your dose. Call your care team if you are unable to keep an appointment. What may interact with this medication? Do not take this medication with any of the following: Live virus vaccines This medication may also interact with the following: Medications that treat or prevent blood clots, such as warfarin, enoxaparin , dalteparin This list may not describe all possible interactions. Give your health care provider a list of all the medicines, herbs, non-prescription drugs, or dietary supplements you use. Also tell them if you smoke, drink alcohol , or use illegal drugs. Some items may interact with your medicine. What should I watch for while using this medication? Your condition will be monitored carefully while you are receiving this medication. This medication may make you feel generally unwell. This is not uncommon as chemotherapy can affect healthy cells as well as cancer cells. Report any side effects. Continue your course of treatment even though you feel ill unless your care team tells you to stop. In some cases, you may be given additional medications to help with side effects. Follow all directions for their use. This medication may increase your risk of getting an infection. Call your care team for advice if you get a fever, chills, sore throat, or other symptoms of a cold or flu. Do not treat yourself. Try to avoid being around people who are  sick. This medication may increase your risk to bruise or bleed. Call your care team if you notice any unusual bleeding. Be careful brushing or flossing your teeth or using a toothpick because you may get an infection or bleed more easily. If you have any dental work done, tell your dentist you are receiving this medication. Avoid taking medications that contain aspirin , acetaminophen , ibuprofen, naproxen, or ketoprofen unless instructed by your care team. These medications may hide a fever. Do not treat diarrhea with over the counter products. Contact your care team if you have diarrhea that lasts more than 2 days or if it is severe and watery. This medication can make you more sensitive to the sun. Keep out of the sun. If you cannot avoid being in the sun, wear protective clothing and sunscreen. Do not use sun lamps, tanning beds, or tanning booths. Talk to your care team if you or your partner wish to become pregnant or think you might be pregnant. This medication can cause serious birth defects if taken during pregnancy and for 3 months after the last  dose. A reliable form of contraception is recommended while taking this medication and for 3 months after the last dose. Talk to your care team about effective forms of contraception. Do not father a child while taking this medication and for 3 months after the last dose. Use a condom while having sex during this time period. Do not breastfeed while taking this medication. This medication may cause infertility. Talk to your care team if you are concerned about your fertility. What side effects may I notice from receiving this medication? Side effects that you should report to your care team as soon as possible: Allergic reactions--skin rash, itching, hives, swelling of the face, lips, tongue, or throat Heart attack--pain or tightness in the chest, shoulders, arms, or jaw, nausea, shortness of breath, cold or clammy skin, feeling faint or  lightheaded Heart failure--shortness of breath, swelling of the ankles, feet, or hands, sudden weight gain, unusual weakness or fatigue Heart rhythm changes--fast or irregular heartbeat, dizziness, feeling faint or lightheaded, chest pain, trouble breathing High ammonia level--unusual weakness or fatigue, confusion, loss of appetite, nausea, vomiting, seizures Infection--fever, chills, cough, sore throat, wounds that don't heal, pain or trouble when passing urine, general feeling of discomfort or being unwell Low red blood cell level--unusual weakness or fatigue, dizziness, headache, trouble breathing Pain, tingling, or numbness in the hands or feet, muscle weakness, change in vision, confusion or trouble speaking, loss of balance or coordination, trouble walking, seizures Redness, swelling, and blistering of the skin over hands and feet Severe or prolonged diarrhea Unusual bruising or bleeding Side effects that usually do not require medical attention (report to your care team if they continue or are bothersome): Dry skin Headache Increased tears Nausea Pain, redness, or swelling with sores inside the mouth or throat Sensitivity to light Vomiting This list may not describe all possible side effects. Call your doctor for medical advice about side effects. You may report side effects to FDA at 1-800-FDA-1088. Where should I keep my medication? This medication is given in a hospital or clinic. It will not be stored at home. NOTE: This sheet is a summary. It may not cover all possible information. If you have questions about this medicine, talk to your doctor, pharmacist, or health care provider.  2024 Elsevier/Gold Standard (2021-08-30 00:00:00)   To help prevent nausea and vomiting after your treatment, we encourage you to take your nausea medication as directed.  BELOW ARE SYMPTOMS THAT SHOULD BE REPORTED IMMEDIATELY: *FEVER GREATER THAN 100.4 F (38 C) OR HIGHER *CHILLS OR  SWEATING *NAUSEA AND VOMITING THAT IS NOT CONTROLLED WITH YOUR NAUSEA MEDICATION *UNUSUAL SHORTNESS OF BREATH *UNUSUAL BRUISING OR BLEEDING *URINARY PROBLEMS (pain or burning when urinating, or frequent urination) *BOWEL PROBLEMS (unusual diarrhea, constipation, pain near the anus) TENDERNESS IN MOUTH AND THROAT WITH OR WITHOUT PRESENCE OF ULCERS (sore throat, sores in mouth, or a toothache) UNUSUAL RASH, SWELLING OR PAIN  UNUSUAL VAGINAL DISCHARGE OR ITCHING   Items with * indicate a potential emergency and should be followed up as soon as possible or go to the Emergency Department if any problems should occur.  Please show the CHEMOTHERAPY ALERT CARD or IMMUNOTHERAPY ALERT CARD at check-in to the Emergency Department and triage nurse.  Should you have questions after your visit or need to cancel or reschedule your appointment, please contact [facility] - A DEPT OF [facility]  Dept: [phone]  and follow the prompts.  Office hours are 8:00 a.m. to 4:30 p.m.  Monday - Friday. Please note that voicemails left after 4:00 p.m. may not be returned until the following business day.  We are closed weekends and major holidays. You have access to a nurse at all times for urgent questions. Please call the main number to the clinic Dept: [phone] and follow the prompts.   For any non-urgent questions, you may also contact your provider using MyChart. We now offer e-Visits for anyone 70 and older to request care online for non-urgent symptoms. For details visit [email or web address].   Also download the MyChart app! Go to the app store, search MyChart, open the app, select [place], and log in with your MyChart username and password.  PUMP STOP at [facility] 12/15/23 at 2pm.

## 2023-12-29 ENCOUNTER — Other Ambulatory Visit: Payer: Self-pay

## 2023-12-29 ENCOUNTER — Encounter (HOSPITAL_COMMUNITY): Payer: Self-pay | Admitting: Family Medicine

## 2023-12-29 ENCOUNTER — Inpatient Hospital Stay (HOSPITAL_BASED_OUTPATIENT_CLINIC_OR_DEPARTMENT_OTHER)
Admission: EM | Admit: 2023-12-29 | Discharge: 2024-01-04 | DRG: 175 | Disposition: A | Discharge status: Home Health | Attending: Internal Medicine | Admitting: Internal Medicine

## 2023-12-29 ENCOUNTER — Inpatient Hospital Stay

## 2023-12-29 ENCOUNTER — Emergency Department (HOSPITAL_BASED_OUTPATIENT_CLINIC_OR_DEPARTMENT_OTHER)

## 2023-12-29 DIAGNOSIS — L89312 Pressure ulcer of right buttock, stage 2: Secondary | ICD-10-CM | POA: Diagnosis present

## 2023-12-29 DIAGNOSIS — L89152 Pressure ulcer of sacral region, stage 2: Secondary | ICD-10-CM | POA: Diagnosis present

## 2023-12-29 DIAGNOSIS — D6859 Other primary thrombophilia: Secondary | ICD-10-CM | POA: Diagnosis present

## 2023-12-29 DIAGNOSIS — I82431 Acute embolism and thrombosis of right popliteal vein: Secondary | ICD-10-CM | POA: Diagnosis present

## 2023-12-29 DIAGNOSIS — I480 Paroxysmal atrial fibrillation: Secondary | ICD-10-CM | POA: Diagnosis present

## 2023-12-29 DIAGNOSIS — L299 Pruritus, unspecified: Secondary | ICD-10-CM | POA: Diagnosis present

## 2023-12-29 DIAGNOSIS — I5032 Chronic diastolic (congestive) heart failure: Secondary | ICD-10-CM | POA: Diagnosis present

## 2023-12-29 DIAGNOSIS — D638 Anemia in other chronic diseases classified elsewhere: Secondary | ICD-10-CM | POA: Diagnosis not present

## 2023-12-29 DIAGNOSIS — R578 Other shock: Secondary | ICD-10-CM | POA: Diagnosis present

## 2023-12-29 DIAGNOSIS — D696 Thrombocytopenia, unspecified: Secondary | ICD-10-CM | POA: Diagnosis present

## 2023-12-29 DIAGNOSIS — Z87891 Personal history of nicotine dependence: Secondary | ICD-10-CM

## 2023-12-29 DIAGNOSIS — Z9109 Other allergy status, other than to drugs and biological substances: Secondary | ICD-10-CM

## 2023-12-29 DIAGNOSIS — Z86718 Personal history of other venous thrombosis and embolism: Secondary | ICD-10-CM

## 2023-12-29 DIAGNOSIS — Z66 Do not resuscitate: Secondary | ICD-10-CM | POA: Diagnosis present

## 2023-12-29 DIAGNOSIS — M4856XD Collapsed vertebra, not elsewhere classified, lumbar region, subsequent encounter for fracture with routine healing: Secondary | ICD-10-CM | POA: Diagnosis present

## 2023-12-29 DIAGNOSIS — R059 Cough, unspecified: Secondary | ICD-10-CM | POA: Diagnosis present

## 2023-12-29 DIAGNOSIS — E039 Hypothyroidism, unspecified: Secondary | ICD-10-CM | POA: Diagnosis present

## 2023-12-29 DIAGNOSIS — I700 Atherosclerosis of aorta: Secondary | ICD-10-CM | POA: Diagnosis present

## 2023-12-29 DIAGNOSIS — L8990 Pressure ulcer of unspecified site, unspecified stage: Secondary | ICD-10-CM | POA: Diagnosis present

## 2023-12-29 DIAGNOSIS — Z1152 Encounter for screening for COVID-19: Secondary | ICD-10-CM

## 2023-12-29 DIAGNOSIS — F419 Anxiety disorder, unspecified: Secondary | ICD-10-CM | POA: Diagnosis present

## 2023-12-29 DIAGNOSIS — D6481 Anemia due to antineoplastic chemotherapy: Secondary | ICD-10-CM | POA: Diagnosis present

## 2023-12-29 DIAGNOSIS — C786 Secondary malignant neoplasm of retroperitoneum and peritoneum: Secondary | ICD-10-CM | POA: Diagnosis present

## 2023-12-29 DIAGNOSIS — Z79899 Other long term (current) drug therapy: Secondary | ICD-10-CM

## 2023-12-29 DIAGNOSIS — R051 Acute cough: Secondary | ICD-10-CM

## 2023-12-29 DIAGNOSIS — Z823 Family history of stroke: Secondary | ICD-10-CM

## 2023-12-29 DIAGNOSIS — Z7189 Other specified counseling: Secondary | ICD-10-CM

## 2023-12-29 DIAGNOSIS — Z515 Encounter for palliative care: Secondary | ICD-10-CM

## 2023-12-29 DIAGNOSIS — I2699 Other pulmonary embolism without acute cor pulmonale: Principal | ICD-10-CM | POA: Diagnosis present

## 2023-12-29 DIAGNOSIS — Z79631 Long term (current) use of antimetabolite agent: Secondary | ICD-10-CM

## 2023-12-29 DIAGNOSIS — T451X5A Adverse effect of antineoplastic and immunosuppressive drugs, initial encounter: Secondary | ICD-10-CM | POA: Diagnosis present

## 2023-12-29 DIAGNOSIS — K529 Noninfective gastroenteritis and colitis, unspecified: Secondary | ICD-10-CM | POA: Diagnosis present

## 2023-12-29 DIAGNOSIS — K769 Liver disease, unspecified: Secondary | ICD-10-CM | POA: Diagnosis present

## 2023-12-29 DIAGNOSIS — J45909 Unspecified asthma, uncomplicated: Secondary | ICD-10-CM | POA: Diagnosis present

## 2023-12-29 DIAGNOSIS — K219 Gastro-esophageal reflux disease without esophagitis: Secondary | ICD-10-CM | POA: Diagnosis present

## 2023-12-29 DIAGNOSIS — C169 Malignant neoplasm of stomach, unspecified: Secondary | ICD-10-CM | POA: Diagnosis present

## 2023-12-29 DIAGNOSIS — E43 Unspecified severe protein-calorie malnutrition: Secondary | ICD-10-CM | POA: Diagnosis present

## 2023-12-29 DIAGNOSIS — R188 Other ascites: Secondary | ICD-10-CM | POA: Diagnosis present

## 2023-12-29 DIAGNOSIS — R042 Hemoptysis: Secondary | ICD-10-CM | POA: Diagnosis present

## 2023-12-29 DIAGNOSIS — I82411 Acute embolism and thrombosis of right femoral vein: Secondary | ICD-10-CM | POA: Diagnosis present

## 2023-12-29 DIAGNOSIS — Z7901 Long term (current) use of anticoagulants: Secondary | ICD-10-CM

## 2023-12-29 DIAGNOSIS — R54 Age-related physical debility: Secondary | ICD-10-CM | POA: Diagnosis present

## 2023-12-29 DIAGNOSIS — Z806 Family history of leukemia: Secondary | ICD-10-CM

## 2023-12-29 DIAGNOSIS — G4730 Sleep apnea, unspecified: Secondary | ICD-10-CM | POA: Diagnosis present

## 2023-12-29 DIAGNOSIS — Z888 Allergy status to other drugs, medicaments and biological substances status: Secondary | ICD-10-CM

## 2023-12-29 DIAGNOSIS — L270 Generalized skin eruption due to drugs and medicaments taken internally: Secondary | ICD-10-CM | POA: Diagnosis present

## 2023-12-29 DIAGNOSIS — Z7951 Long term (current) use of inhaled steroids: Secondary | ICD-10-CM

## 2023-12-29 DIAGNOSIS — J9811 Atelectasis: Secondary | ICD-10-CM | POA: Diagnosis present

## 2023-12-29 DIAGNOSIS — Z79890 Hormone replacement therapy (postmenopausal): Secondary | ICD-10-CM

## 2023-12-29 DIAGNOSIS — K648 Other hemorrhoids: Secondary | ICD-10-CM | POA: Diagnosis present

## 2023-12-29 DIAGNOSIS — Z833 Family history of diabetes mellitus: Secondary | ICD-10-CM

## 2023-12-29 DIAGNOSIS — L409 Psoriasis, unspecified: Secondary | ICD-10-CM | POA: Diagnosis present

## 2023-12-29 LAB — CBC WITH DIFFERENTIAL/PLATELET
Abs Immature Granulocytes: 0.02 K/uL (ref 0.00–0.07)
Basophils Absolute: 0 K/uL (ref 0.0–0.1)
Basophils Relative: 1 %
Eosinophils Absolute: 0.4 K/uL (ref 0.0–0.5)
Eosinophils Relative: 7 %
HCT: 32.5 % — ABNORMAL LOW (ref 39.0–52.0)
Hemoglobin: 10.5 g/dL — ABNORMAL LOW (ref 13.0–17.0)
Immature Granulocytes: 0 %
Lymphocytes Relative: 9 %
Lymphs Abs: 0.5 K/uL — ABNORMAL LOW (ref 0.7–4.0)
MCH: 29 pg (ref 26.0–34.0)
MCHC: 32.3 g/dL (ref 30.0–36.0)
MCV: 89.8 fL (ref 80.0–100.0)
Monocytes Absolute: 0.2 K/uL (ref 0.1–1.0)
Monocytes Relative: 4 %
Neutro Abs: 4.1 K/uL (ref 1.7–7.7)
Neutrophils Relative %: 79 %
Platelets: 107 K/uL — ABNORMAL LOW (ref 150–400)
RBC: 3.62 MIL/uL — ABNORMAL LOW (ref 4.22–5.81)
RDW: 20 % — ABNORMAL HIGH (ref 11.5–15.5)
WBC: 5.2 K/uL (ref 4.0–10.5)
nRBC: 0 % (ref 0.0–0.2)

## 2023-12-29 LAB — RESP PANEL BY RT-PCR (RSV, FLU A&B, COVID)  RVPGX2
Influenza A by PCR: NEGATIVE
Influenza B by PCR: NEGATIVE
Resp Syncytial Virus by PCR: NEGATIVE
SARS Coronavirus 2 by RT PCR: NEGATIVE

## 2023-12-29 LAB — HEMOGLOBIN AND HEMATOCRIT, BLOOD
HCT: 32.9 % — ABNORMAL LOW (ref 39.0–52.0)
Hemoglobin: 9.8 g/dL — ABNORMAL LOW (ref 13.0–17.0)

## 2023-12-29 LAB — COMPREHENSIVE METABOLIC PANEL WITH GFR
ALT: 28 U/L (ref 0–44)
AST: 20 U/L (ref 15–41)
Albumin: 2.8 g/dL — ABNORMAL LOW (ref 3.5–5.0)
Alkaline Phosphatase: 388 U/L — ABNORMAL HIGH (ref 38–126)
Anion gap: 11 (ref 5–15)
BUN: 23 mg/dL (ref 8–23)
CO2: 22 mmol/L (ref 22–32)
Calcium: 8.3 mg/dL — ABNORMAL LOW (ref 8.9–10.3)
Chloride: 104 mmol/L (ref 98–111)
Creatinine, Ser: 0.83 mg/dL (ref 0.61–1.24)
GFR, Estimated: >60 mL/min (ref >60)
Glucose, Bld: 110 mg/dL — ABNORMAL HIGH (ref 70–99)
Potassium: 3.8 mmol/L (ref 3.5–5.1)
Sodium: 137 mmol/L (ref 135–145)
Total Bilirubin: 0.6 mg/dL (ref 0.0–1.2)
Total Protein: 5.3 g/dL — ABNORMAL LOW (ref 6.5–8.1)

## 2023-12-29 LAB — PROTIME-INR
INR: 1.1 (ref 0.8–1.2)
Prothrombin Time: 14.4 s (ref 11.4–15.2)

## 2023-12-29 LAB — PRO BRAIN NATRIURETIC PEPTIDE: Pro Brain Natriuretic Peptide: 550 pg/mL — ABNORMAL HIGH (ref <300.0)

## 2023-12-29 LAB — TROPONIN T, HIGH SENSITIVITY: Troponin T High Sensitivity: 18 ng/L (ref 0–19)

## 2023-12-29 MED ORDER — LACTATED RINGERS IV BOLUS
500.0000 mL | Freq: Once | INTRAVENOUS | Status: AC
  Administered 2023-12-29: 500 mL via INTRAVENOUS

## 2023-12-29 MED ORDER — PANTOPRAZOLE SODIUM 40 MG IV SOLR
40.0000 mg | Freq: Two times a day (BID) | INTRAVENOUS | Status: DC
  Administered 2023-12-29 – 2024-01-04 (×12): 40 mg via INTRAVENOUS
  Filled 2023-12-29 (×12): qty 10

## 2023-12-29 MED ORDER — HEPARIN BOLUS VIA INFUSION
5000.0000 [IU] | Freq: Once | INTRAVENOUS | Status: AC
  Administered 2023-12-29: 5000 [IU] via INTRAVENOUS

## 2023-12-29 MED ORDER — MELATONIN 3 MG PO TABS
3.0000 mg | ORAL_TABLET | Freq: Every evening | ORAL | Status: DC | PRN
  Administered 2023-12-29: 3 mg via ORAL
  Filled 2023-12-29: qty 1

## 2023-12-29 MED ORDER — LACTATED RINGERS IV SOLN: INTRAVENOUS | Status: AC

## 2023-12-29 MED ORDER — BENZONATATE 100 MG PO CAPS: 200.0000 mg | ORAL_CAPSULE | Freq: Three times a day (TID) | ORAL | Status: DC | PRN

## 2023-12-29 MED ORDER — GUAIFENESIN ER 600 MG PO TB12
600.0000 mg | ORAL_TABLET | Freq: Two times a day (BID) | ORAL | Status: DC
  Administered 2023-12-30 – 2024-01-04 (×11): 600 mg via ORAL
  Filled 2023-12-29 (×11): qty 1

## 2023-12-29 MED ORDER — LEVALBUTEROL HCL 1.25 MG/0.5ML IN NEBU: 1.2500 mg | INHALATION_SOLUTION | RESPIRATORY_TRACT | Status: DC | PRN

## 2023-12-29 MED ORDER — SODIUM CHLORIDE 0.9 % IV SOLN
500.0000 mg | INTRAVENOUS | Status: DC
  Administered 2023-12-30 (×2): 500 mg via INTRAVENOUS
  Filled 2023-12-29 (×2): qty 5

## 2023-12-29 MED ORDER — LEVOTHYROXINE SODIUM 112 MCG PO TABS
112.0000 ug | ORAL_TABLET | Freq: Every day | ORAL | Status: DC
  Administered 2023-12-30 – 2024-01-04 (×6): 112 ug via ORAL
  Filled 2023-12-29 (×7): qty 1

## 2023-12-29 MED ORDER — HYDROCORTISONE 1 % EX CREA
1.0000 | TOPICAL_CREAM | Freq: Three times a day (TID) | CUTANEOUS | Status: DC | PRN
  Administered 2023-12-30: 1 via TOPICAL
  Filled 2023-12-29: qty 28

## 2023-12-29 MED ORDER — MENTHOL 3 MG MT LOZG: 1.0000 | LOZENGE | OROMUCOSAL | Status: DC | PRN

## 2023-12-29 MED ORDER — CHLORHEXIDINE GLUCONATE CLOTH 2 % EX PADS
6.0000 | MEDICATED_PAD | Freq: Every day | CUTANEOUS | Status: DC
  Administered 2023-12-30 – 2024-01-04 (×5): 6 via TOPICAL

## 2023-12-29 MED ORDER — ACETAMINOPHEN 650 MG RE SUPP: 650.0000 mg | Freq: Four times a day (QID) | RECTAL | Status: DC | PRN

## 2023-12-29 MED ORDER — ACETAMINOPHEN 325 MG PO TABS
650.0000 mg | ORAL_TABLET | Freq: Four times a day (QID) | ORAL | Status: DC | PRN
  Administered 2023-12-29: 650 mg via ORAL
  Filled 2023-12-29: qty 2

## 2023-12-29 MED ORDER — ONDANSETRON HCL 4 MG/2ML IJ SOLN: 4.0000 mg | Freq: Four times a day (QID) | INTRAMUSCULAR | Status: DC | PRN

## 2023-12-29 MED ORDER — HEPARIN (PORCINE) 25000 UT/250ML-% IV SOLN
1600.0000 [IU]/h | INTRAVENOUS | Status: DC
  Administered 2023-12-29 – 2023-12-30 (×2): 1500 [IU]/h via INTRAVENOUS
  Administered 2023-12-31 – 2024-01-02 (×5): 1600 [IU]/h via INTRAVENOUS
  Filled 2023-12-29 (×7): qty 250

## 2023-12-29 MED ORDER — SODIUM CHLORIDE 0.9% FLUSH: 10.0000 mL | INTRAVENOUS | Status: DC | PRN

## 2023-12-29 MED ORDER — DIPHENHYDRAMINE HCL 25 MG PO CAPS
25.0000 mg | ORAL_CAPSULE | Freq: Four times a day (QID) | ORAL | Status: DC | PRN
  Administered 2023-12-29 – 2023-12-30 (×2): 25 mg via ORAL
  Filled 2023-12-29 (×2): qty 1

## 2023-12-29 MED ORDER — LACTATED RINGERS IV BOLUS
1000.0000 mL | Freq: Once | INTRAVENOUS | Status: AC
  Administered 2023-12-29: 1000 mL via INTRAVENOUS

## 2023-12-29 MED ORDER — IOHEXOL 350 MG/ML SOLN
80.0000 mL | Freq: Once | INTRAVENOUS | Status: AC | PRN
  Administered 2023-12-29: 80 mL via INTRAVENOUS

## 2023-12-29 MED ORDER — SODIUM CHLORIDE 0.9 % IV SOLN
1.0000 g | INTRAVENOUS | Status: DC
  Administered 2023-12-29 – 2024-01-02 (×5): 1 g via INTRAVENOUS
  Filled 2023-12-29 (×5): qty 10

## 2023-12-29 NOTE — ED Provider Notes (Signed)
 [facility] AT [facility] Provider Note   CSN: [id number] Arrival date & time: 12/29/23  1501     Patient presents with: Hemoptysis   BOB D Mayeux is a 78 y.o. male.  {Add pertinent medical, surgical, social history, OB history to HPI:32947} HPI     78 year old male with a history of gastric cancer with CT evidence of abdominal carcinomatosis, hypothyroidism, atrial fibrillation, pulmonary embolism and DVT on Lovenox , prior admission for hematemesis who presents with concern for congestion and hemoptysis since Thursday.  Thursday after treatment, at first was cough with a little blood mixed in with mucus, then it was a streak, and then today a little more.  Yellowish mucus being coughed up, has had that for a while. Not necessarily more congestion recently. Wife feels like he has had more coughing, he does not think so, feels when he is on tpn it is more.  No chest pain.  Does have some dyspnea, pain in the back on right side, soreness, worse with deep breath. Started today.  In April had hematemesis.  No new leg pain or swelling.      Might have missed lovenox  yesterday, missed doses a few times, did not take it today Past Medical History:  Diagnosis Date   A-fib (HCC)    Asthma    Cataract    Gastric cancer (HCC)    GERD (gastroesophageal reflux disease)    Lichen planus    Methotrexate, long term, current use    no longer taking   Psoriasis    Sleep apnea    Thyroid  disease    Urticaria      Prior to Admission medications   Medication Sig Start Date End Date Taking? Authorizing Provider  acetaminophen  (TYLENOL ) 500 MG tablet Take 500 mg by mouth every 6 (six) hours as needed for mild pain (pain score 1-3).    [provider]  acitretin  (SORIATANE ) 25 MG capsule Take 25 mg by mouth daily. 01/09/23   [provider]  albuterol  (VENTOLIN  HFA) 108 (90 Base) MCG/ACT inhaler TAKE 2 PUFFS BY MOUTH EVERY 6 HOURS AS NEEDED FOR WHEEZE  OR SHORTNESS OF BREATH Patient not taking: Reported on 12/12/2023 03/22/23   Cloretta Arley NOVAK, MD  clobetasol  ointment (TEMOVATE ) 0.05 % Apply 1 Application topically 2 (two) times daily. 01/02/23   [provider]  dexamethasone  (DECADRON ) 4 MG tablet Take 2 tablets (8 mg total) by mouth daily. Start on the day after zolbetuximab for 3 days. Take with food. 11/14/23   Cloretta Arley NOVAK, MD  enoxaparin  (LOVENOX ) 40 MG/0.4ML injection Inject 0.4 mLs (40 mg total) into the skin daily. 09/03/23   Cloretta Arley NOVAK, MD  fluticasone  (FLONASE ) 50 MCG/ACT nasal spray Place 1 spray into both nostrils daily as needed for allergies or rhinitis. Patient not taking: Reported on 12/12/2023    [provider]  fluticasone  furoate-vilanterol (BREO ELLIPTA ) 100-25 MCG/ACT AEPB Inhale 1 puff into the lungs daily. Patient not taking: Reported on 12/12/2023 07/03/22   Brenna Cain L, DO  gabapentin  (NEURONTIN ) 300 MG capsule Take 1 capsule (300 mg total) by mouth at bedtime. 11/08/23   Harvey Seltzer, MD  hydrOXYzine  (ATARAX ) 25 MG tablet Take 25 mg by mouth 3 (three) times daily as needed for itching. 04/19/23   [provider]  levothyroxine  (SYNTHROID ) 112 MCG tablet Take 112 mcg by mouth daily before breakfast.    [provider]  lidocaine -prilocaine  (EMLA ) cream Apply 1 Application topically as needed (Apply  1 hour before coming to [facility] for accessing). 04/27/22   Cloretta Arley NOVAK, MD  loratadine (CLARITIN) 10 MG tablet Take 10 mg by mouth daily. Patient not taking: Reported on 12/12/2023    [provider]  mirtazapine  (REMERON ) 7.5 MG tablet Take 1 tablet (7.5 mg total) by mouth at bedtime. 11/28/23   Burton, Lacie K, NP  OLANZapine  (ZYPREXA ) 5 MG tablet Take 1 tablet (5 mg total) by mouth at bedtime. Start on the day after zolbetuximab for 3 days. 11/14/23   Cloretta Arley NOVAK, MD  ondansetron  (ZOFRAN ) 8 MG tablet Take 1 tablet (8 mg total) by mouth every 8 (eight) hours as  needed for nausea or vomiting. 10/04/23   Burton, Lacie K, NP  oxyCODONE  (OXY IR/ROXICODONE ) 5 MG immediate release tablet Take 0.5-1 tablets (2.5-5 mg total) by mouth every 6 (six) hours as needed for severe pain (pain score 7-10). Patient not taking: Reported on 12/12/2023 10/29/23   Debby Olam POUR, NP  pantoprazole  (PROTONIX ) 40 MG tablet Take 1 tablet (40 mg total) by mouth 2 (two) times daily. 11/22/23   Thomas, Lisa K, NP  polyethylene glycol (MIRALAX  / GLYCOLAX ) 17 g packet Take 17 g by mouth every evening.    [provider]  potassium chloride  (KLOR-CON  M10) 10 MEQ tablet Take 2 tablets (20 mEq total) by mouth daily. 06/15/23   Cloretta Arley NOVAK, MD  prochlorperazine  (COMPAZINE ) 10 MG tablet Take 1 tablet (10 mg total) by mouth every 6 (six) hours as needed for nausea or vomiting. 12/26/23   Debby Olam POUR, NP  triamcinolone  (KENALOG ) 0.1 % Apply 1 application  topically daily as needed (lichen planus flare).    [provider]  urea (CARMOL) 40 % CREA Apply 1 Application topically as needed. 10/03/18   [provider]  vancomycin  (VANCOCIN ) 125 MG capsule Take 1 capsule (125 mg total) by mouth 4 (four) times daily. 11/02/23   Cloretta Arley NOVAK, MD    Allergies: Viviane, Mixed grasses, and Hydroxyquinolines    Review of Systems  Updated Vital Signs BP 105/67 (BP Location: Right Arm)   Pulse 100   Temp 99.1 F (37.3 C) (Oral)   Resp 16   Ht 5' 11 (1.803 m)   Wt 90.7 kg   SpO2 96%   BMI 27.89 kg/m   Physical Exam  (all labs ordered are listed, but only abnormal results are displayed) Labs Reviewed  CBC WITH DIFFERENTIAL/PLATELET - Abnormal; Notable for the following components:      Result Value   RBC 3.62 (*)    Hemoglobin 10.5 (*)    HCT 32.5 (*)    RDW 20.0 (*)    Platelets 107 (*)    Lymphs Abs 0.5 (*)    All other components within normal limits  RESP PANEL BY RT-PCR (RSV, FLU A&B, COVID)  RVPGX2  COMPREHENSIVE METABOLIC PANEL WITH GFR     EKG: None  Radiology: No results found.  {Document cardiac monitor, telemetry assessment procedure when appropriate:32947} Procedures   Medications Ordered in the ED - No data to display    {Click here for ABCD2, HEART and other calculators REFRESH Note before signing:1}                              Medical Decision Making Amount and/or Complexity of Data Reviewed Labs: ordered. Radiology: ordered.  Risk Prescription drug management.   ***   Labs completed and personally  evaluated interpreted by me show no clinically significant electrolyte abnormalities, hemoglobin of 10.5 from prior values of 11, platelets of 107.  Troponin is within normal limits.  Pro BNP is 550, not significantly elevated for age.  COVID, flu, RSV testing were done given presence of cough and are negative.  CT PE study completed in the setting of prior thromboembolism, cancer history, and shows bilateral PE without right heart strain.   {Document critical care time when appropriate  Document review of labs and clinical decision tools ie CHADS2VASC2, etc  Document your independent review of radiology images and any outside records  Document your discussion with family members, caretakers and with consultants  Document social determinants of health affecting pt's care  Document your decision making why or why not admission, treatments were needed:32947:::1}   Final diagnoses:  None    ED Discharge Orders     None

## 2023-12-29 NOTE — ED Triage Notes (Signed)
 Pt POV reporting congestion and hemoptysis since Thursday, denies fever. Hx gastric cancer, in active chemotherapy.

## 2023-12-29 NOTE — Progress Notes (Signed)
 Plan of Care Note for accepted transfer   Patient: Todd Harrison MRN: [id number]   DOA: 12/29/2023  Facility requesting transfer: [facility] Requesting Provider: Dr. Dreama  Reason for transfer: PE  Facility course: 78 year old with medical history significant for hypothyroidism, saddle pulmonary embolism with acute cor pulmonale on lovenox , hx of GI bleed 4 months ago with hemorrhagic shock, atrial fib, metastatic gastric cancer on chemo (last infusion 12/27/23) diastolic CHF who presented to ED with complaints of hemoptysis since Thursday. He states he coughed up about 2 tablespoons of bloody mucous. He has missed a few doses of his lovenox .   Vitals: temp: 99.1, bp: 105/67, HR: 100, RR: 16, oxygen: 96% RA Pertinent labs: hgb: 10.5, alk phos: 388, BNP: 550,  CTA chest: Bilateral lower lobe pulmonary artery emboli. No evidence of right heart straining. 2. Trace left pleural effusion and bibasilar subpleural atelectasis. Pneumonia is not excluded. 3.  Aortic Atherosclerosis In ED: started on heparin  gtt. Oncology consulted. [facility] asked to admit.   Plan of care: The patient is accepted for admission to Progressive unit, at [facility]..    Author: Isaiah Geralds, MD 12/29/2023  Check [email or web address] for on-call coverage.  Nursing staff, Please call [facility] Admits & Consults System-Wide number on Amion as soon as patient's arrival, so appropriate admitting provider can evaluate the pt.

## 2023-12-29 NOTE — Progress Notes (Signed)
 PHARMACY - ANTICOAGULATION CONSULT NOTE  Pharmacy Consult for heparin  Indication: pulmonary embolus  Allergies  Allergen Reactions   Gold Itching   Mixed Grasses     unknown   Hydroxyquinolines Rash    Patient Measurements: Height: 5' 11 (180.3 cm) Weight: 90.7 kg (200 lb) IBW/kg (Calculated) : 75.3 HEPARIN  DW (KG): 90.7  Vital Signs: Temp: 99.1 F (37.3 C) (08/23 1508) Temp Source: Oral (08/23 1508) BP: 105/67 (08/23 1508) Pulse Rate: 100 (08/23 1508)  Labs: Recent Labs    12/29/23 1544  HGB 10.5*  HCT 32.5*  PLT 107*  LABPROT 14.4  INR 1.1  CREATININE 0.83    Estimated Creatinine Clearance: 88.6 mL/min (by C-G formula based on SCr of 0.83 mg/dL).   Medical History: Past Medical History:  Diagnosis Date   A-fib (HCC)    Asthma    Cataract    Gastric cancer (HCC)    GERD (gastroesophageal reflux disease)    Lichen planus    Methotrexate, long term, current use    no longer taking   Psoriasis    Sleep apnea    Thyroid  disease    Urticaria     Assessment: 77 YOM presenting with dyspnea, hx gastric cancer and VTE on lovenox  PTA (DVT ppx dosing 40mg  daily), missed some doses recently including today (none today).  CT angio chest with PE  Goal of Therapy:  Heparin  level 0.3-0.7 units/ml Monitor platelets by anticoagulation protocol: Yes   Plan:  Heparin  5000 units IV x 1, and gtt at 1500 units/hr F/u 8 hour heparin  level F/u long term [facility] plan  Dorn Poot, PharmD, [facility] Clinical Pharmacist ED Pharmacist Phone # [phone] 12/29/2023 5:22 PM

## 2023-12-29 NOTE — ED Notes (Signed)
 Patient transported to CT

## 2023-12-29 NOTE — ED Notes (Signed)
 Pt pressure in the 80s systolic. Asymptomatic at this time. Schlossman MD notified.

## 2023-12-29 NOTE — ED Notes (Signed)
 Note: pt. Arrives with an accessed port, which remains so.

## 2023-12-29 NOTE — H&P (Addendum)
 History and Physical      Todd Harrison FMW:[id number] DOB: March 14, 1946 DOA: 12/29/2023; DOS: 12/29/2023  PCP: Henry Ingle, MD  Patient coming from: home   I have personally briefly reviewed patient's old medical records in [facility]  Chief Complaint: Cough  HPI: Todd Harrison is a 78 y.o. male with medical history significant for metastatic gastric cancer on chemotherapy, paroxysmal atrial fibrillation on prophylactic dose Lovenox , pulmonary embolism in March 2024, chronic diastolic heart failure, anemia of chronic disease with baseline hemoglobin 9-12, chronic diarrhea, who is admitted to [facility] on 12/29/2023 awaiting transfer from [facility] with bilateral pulmonary emboli after presenting from home to the latter facility complaining of cough.   The patient reports 2 to 3 days of new productive cough associated with production of thick with associated blood-tinge in the absence of any associated clots.  Over the timeframe, he reports mild shortness of breath in the absence of any associated orthopnea or PND.  He notes chronic swelling in the bilateral lower extremities, and conveys that over the last few weeks, the swelling in the bilateral lower extremities has improved.  No recent calf tenderness.  Denies any associated chest pain, palpitations diaphoresis, dizziness Presyncope, or syncope.  He conveys that he has had a persistent erythematous, pruritic rash over the last 2 weeks involving upper and lower extremities bilaterally as well as his trunk.  Denies any recent worsening of the extent or distribution of this erythematous rash over the last few days.  No recent subjective fever, chills, rigors, or generalized myalgias.  No recent wheezing.   He has a history of pulmonary embolism in March 2024.  After completing course of anticoagulation for this, he was converted to prophylactic dose Lovenox  40 mg SQ daily and April 2025 for ongoing thromboembolic prophylaxis  in the setting of a history of paroxysmal atrial fibrillation.  He notes that he has missed a few doses of his Lovenox  over the last few weeks.  His medical history is also notable for metastatic gastric cancer for which he is on chemotherapy, having most recently received chemotherapy on 12/27/2023.  Follows with Dr. Cloretta as his outpatient oncologist.  He is on TPN as an outpatient, which was started in April 2025.  He notes chronic diarrhea starting with initiation of TPN in April 2025, without recent worsening thereof.  He also has a history of acute gastrointestinal bleed in April 2025.  Denies any recent melena or hematochezia, and or any recent abdominal discomfort.   patient's medical history also includes anemia of chronic disease with baseline hemoglobin 9-12, with most recent hemoglobin data points notable for the following: 11.2 on 12/26/2023, at 11.6 on 12/12/2023, and 11.0 on 11/28/2023.  Additionally, per chart review, patient's baseline systolic blood pressures appear to be in the low 100s, We will baseline diastolic blood pressures are in the low 60s mmHg.     [facility] ED Course:  Vital signs in the ED were notable for the following: Temperature max 99.1; initial rates in the low 100s, subsequently decreasing into the 80s to 90s following a 1 L LR bolus; systolic pressures in the high 80s to low 100s, with final documented blood pressure of [facility] noted to be 97-59.  Upon arrival at [facility], blood pressure noted to be 97/62; respiratory rate 16-23, oxygen saturation 95 to 96% on room air.  Upon arrival it was involved, oxygen saturation noted to be 99% on room air.  Labs were notable for the following:  CMP was notable for the following: Sodium 137, BUN 23 compared to 22 on 01/03/2024, creatinine 0.83 compared to 0.77 on 12/26/2023, calcium  adjusted for mild hypoalbuminemia noted to be 9.3, avidin 2.8, alkaline phosphatase 388 compared to 71 on 12/26/2023.  Otherwise, liver  enzymes were within normal limits.  proBNP was 550, without any prior proBNP data points available for point comparison.  Most recent BNP was 431 and March 2024.  High sensitive troponin I was 18.  CBC notable for white blood cell count 5200, hemoglobin 10.5 associated Neuraceq/Norocarp properties, platelet count 107 compared to 156 on 12/26/2023.  INR 1.1.  COVID, influenza, RSV PCR were all negative.  Per my interpretation, EKG in ED demonstrated the following: Sinus rhythm with heart rate 88, normal intervals, nonspecific T wave inversion in lead III, and no evidence of ST changes, including no evidence of ST elevation.  Imaging in the ED, per corresponding formal radiology read, was notable for the following: CTA chest with PE protocol showed evidence of bilateral lower lobe pulmonary emboli, without CTA evidence of right heart strain.  CT a also showed trace left pleural effusion as well as bibasilar airspace opacities, potentially representing atelectasis versus pneumonia, will demonstrate no evidence of pulmonary edema nor any evidence of pneumothorax.  While in the ED, the following were administered: Heparin  bolus followed by initiation of heparin  drip.  Lactated Ringer 's x 1 L bolus.  Subsequently, the patient was admitted to [facility] for further evaluation and management of acute bilateral pulmonary emboli.      Review of Systems: As per HPI otherwise 10 point review of systems negative.   Past Medical History:  Diagnosis Date   A-fib (HCC)    Asthma    Cataract    Gastric cancer (HCC)    GERD (gastroesophageal reflux disease)    Lichen planus    Methotrexate, long term, current use    no longer taking   Psoriasis    Sleep apnea    Thyroid  disease    Urticaria     Past Surgical History:  Procedure Laterality Date   CARDIOVERSION N/A 02/23/2020   Procedure: CARDIOVERSION;  Surgeon: Santo Stanly LABOR, MD;  Location: [facility] ENDOSCOPY;  Service: Cardiovascular;   Laterality: N/A;   CARDIOVERSION N/A 04/12/2020   Procedure: CARDIOVERSION;  Surgeon: Lonni Slain, MD;  Location: [facility] ENDOSCOPY;  Service: Cardiovascular;  Laterality: N/A;   CATARACT EXTRACTION Left    2019   ESOPHAGOGASTRODUODENOSCOPY N/A 08/18/2023   Procedure: EGD (ESOPHAGOGASTRODUODENOSCOPY);  Surgeon: Legrand Victory LITTIE DOUGLAS, MD;  Location: [facility] ENDOSCOPY;  Service: Gastroenterology;  Laterality: N/A;   IR IMAGING GUIDED PORT INSERTION  05/03/2022   TONSILLECTOMY      Social History:  reports that he quit smoking about 43 years ago. His smoking use included cigarettes. He has never used smokeless tobacco. He reports current alcohol  use of about 1.0 standard drink of alcohol  per week. He reports that he does not use drugs.   Allergies  Allergen Reactions   Gold Itching   Mixed Grasses     unknown   Hydroxyquinolines Rash    Family History  Problem Relation Age of Onset   Diabetes Mother    Stroke Mother    Leukemia Maternal Great-grandmother    Allergic rhinitis Neg Hx    Asthma Neg Hx    Eczema Neg Hx    Urticaria Neg Hx    Colon cancer Neg Hx    Esophageal cancer Neg Hx    Stomach cancer Neg  Hx      Prior to Admission medications   Medication Sig Start Date End Date Taking? Authorizing Provider  acetaminophen  (TYLENOL ) 500 MG tablet Take 500 mg by mouth every 6 (six) hours as needed for mild pain (pain score 1-3).    [provider]  acitretin  (SORIATANE ) 25 MG capsule Take 25 mg by mouth daily. 01/09/23   [provider]  albuterol  (VENTOLIN  HFA) 108 (90 Base) MCG/ACT inhaler TAKE 2 PUFFS BY MOUTH EVERY 6 HOURS AS NEEDED FOR WHEEZE OR SHORTNESS OF BREATH Patient not taking: Reported on 12/12/2023 03/22/23   Cloretta Arley NOVAK, MD  clobetasol  ointment (TEMOVATE ) 0.05 % Apply 1 Application topically 2 (two) times daily. 01/02/23   [provider]  dexamethasone  (DECADRON ) 4 MG tablet Take 2 tablets (8 mg total) by mouth daily. Start on the day  after zolbetuximab for 3 days. Take with food. 11/14/23   Cloretta Arley NOVAK, MD  enoxaparin  (LOVENOX ) 40 MG/0.4ML injection Inject 0.4 mLs (40 mg total) into the skin daily. 09/03/23   Cloretta Arley NOVAK, MD  fluticasone  (FLONASE ) 50 MCG/ACT nasal spray Place 1 spray into both nostrils daily as needed for allergies or rhinitis. Patient not taking: Reported on 12/12/2023    [provider]  fluticasone  furoate-vilanterol (BREO ELLIPTA ) 100-25 MCG/ACT AEPB Inhale 1 puff into the lungs daily. Patient not taking: Reported on 12/12/2023 07/03/22   Brenna Cain L, DO  gabapentin  (NEURONTIN ) 300 MG capsule Take 1 capsule (300 mg total) by mouth at bedtime. 11/08/23   Harvey Seltzer, MD  hydrOXYzine  (ATARAX ) 25 MG tablet Take 25 mg by mouth 3 (three) times daily as needed for itching. 04/19/23   [provider]  levothyroxine  (SYNTHROID ) 112 MCG tablet Take 112 mcg by mouth daily before breakfast.    [provider]  lidocaine -prilocaine  (EMLA ) cream Apply 1 Application topically as needed (Apply 1 hour before coming to [facility] for accessing). 04/27/22   Cloretta Arley NOVAK, MD  loratadine (CLARITIN) 10 MG tablet Take 10 mg by mouth daily. Patient not taking: Reported on 12/12/2023    [provider]  mirtazapine  (REMERON ) 7.5 MG tablet Take 1 tablet (7.5 mg total) by mouth at bedtime. 11/28/23   Burton, Lacie K, NP  OLANZapine  (ZYPREXA ) 5 MG tablet Take 1 tablet (5 mg total) by mouth at bedtime. Start on the day after zolbetuximab for 3 days. 11/14/23   Cloretta Arley NOVAK, MD  ondansetron  (ZOFRAN ) 8 MG tablet Take 1 tablet (8 mg total) by mouth every 8 (eight) hours as needed for nausea or vomiting. 10/04/23   Burton, Lacie K, NP  oxyCODONE  (OXY IR/ROXICODONE ) 5 MG immediate release tablet Take 0.5-1 tablets (2.5-5 mg total) by mouth every 6 (six) hours as needed for severe pain (pain score 7-10). Patient not taking: Reported on 12/12/2023 10/29/23   Debby Olam POUR, NP  pantoprazole   (PROTONIX ) 40 MG tablet Take 1 tablet (40 mg total) by mouth 2 (two) times daily. 11/22/23   Thomas, Lisa K, NP  polyethylene glycol (MIRALAX  / GLYCOLAX ) 17 g packet Take 17 g by mouth every evening.    [provider]  potassium chloride  (KLOR-CON  M10) 10 MEQ tablet Take 2 tablets (20 mEq total) by mouth daily. 06/15/23   Cloretta Arley NOVAK, MD  prochlorperazine  (COMPAZINE ) 10 MG tablet Take 1 tablet (10 mg total) by mouth every 6 (six) hours as needed for nausea or vomiting. 12/26/23   Debby Olam POUR, NP  triamcinolone  (KENALOG ) 0.1 % Apply 1  application  topically daily as needed (lichen planus flare).    [provider]  urea (CARMOL) 40 % CREA Apply 1 Application topically as needed. 10/03/18   [provider]  vancomycin  (VANCOCIN ) 125 MG capsule Take 1 capsule (125 mg total) by mouth 4 (four) times daily. 11/02/23   Cloretta Arley NOVAK, MD     Objective    Physical Exam: Vitals:   12/29/23 1800 12/29/23 1900 12/29/23 1945 12/29/23 2040  BP: (!) 90/57 (!) 88/62 (!) 97/59 97/62  Pulse: 91 85 84 90  Resp: 16 (!) 21 (!) 23 18  Temp:    99.2 F (37.3 C)  TempSrc:    Oral  SpO2: 95% 95% 93% 99%  Weight:      Height:        General: appears to be stated age; alert, oriented Skin: warm, dry, generalized erythematous rash without bulla Head:  AT/[place] Mouth:  Oral mucosa membranes appear dry, normal dentition Neck: supple; trachea midline Chest: Right-sided Port-A-Cath noted Heart:  RRR; did not appreciate any M/R/G Lungs: CTAB, did not appreciate any wheezes, rales, or rhonchi Abdomen: + BS; soft, ND, NT Vascular: 2+ pedal pulses b/l; 2+ radial pulses b/l Extremities: 2+ edema bilateral lower extremities, no muscle wasting, no evidence of calf tenderness    Labs on Admission: I have personally reviewed following labs and imaging studies  CBC: Recent Labs  Lab 12/26/23 1328 12/29/23 1544  WBC 5.3 5.2  NEUTROABS 2.9 4.1  HGB 11.2* 10.5*  HCT 35.4* 32.5*   MCV 90.8 89.8  PLT 156 107*   Basic Metabolic Panel: Recent Labs  Lab 12/26/23 1328 12/29/23 1544  NA 140 137  K 3.9 3.8  CL 104 104  CO2 24 22  GLUCOSE 112* 110*  BUN 22 23  CREATININE 0.77 0.83  CALCIUM  8.9 8.3*  MG 2.2  --   PHOS 3.1  --    GFR: Estimated Creatinine Clearance: 88.6 mL/min (by C-G formula based on SCr of 0.83 mg/dL). Liver Function Tests: Recent Labs  Lab 12/26/23 1328 12/29/23 1544  AST 71* 20  ALT 61* 28  ALKPHOS 701* 388*  BILITOT 0.6 0.6  PROT 5.6* 5.3*  ALBUMIN 3.1* 2.8*   No results for input(s): LIPASE, AMYLASE in the last 168 hours. No results for input(s): AMMONIA in the last 168 hours. Coagulation Profile: Recent Labs  Lab 12/29/23 1544  INR 1.1   Cardiac Enzymes: No results for input(s): CKTOTAL, CKMB, CKMBINDEX, TROPONINI in the last 168 hours. BNP (last 3 results) Recent Labs    12/29/23 1544  PROBNP 550.0*   HbA1C: No results for input(s): HGBA1C in the last 72 hours. CBG: No results for input(s): GLUCAP in the last 168 hours. Lipid Profile: No results for input(s): CHOL, HDL, LDLCALC, TRIG, CHOLHDL, LDLDIRECT in the last 72 hours. Thyroid  Function Tests: No results for input(s): TSH, T4TOTAL, FREET4, T3FREE, THYROIDAB in the last 72 hours. Anemia Panel: No results for input(s): VITAMINB12, FOLATE, FERRITIN, TIBC, IRON, RETICCTPCT in the last 72 hours. Urine analysis:    Component Value Date/Time   COLORURINE YELLOW 12/04/2022 1240   APPEARANCEUR CLEAR 12/04/2022 1240   LABSPEC 1.024 12/04/2022 1240   PHURINE 6.5 12/04/2022 1240   GLUCOSEU NEGATIVE 12/04/2022 1240   HGBUR NEGATIVE 12/04/2022 1240   BILIRUBINUR NEGATIVE 12/04/2022 1240   BILIRUBINUR negative 08/10/2016 1207   KETONESUR NEGATIVE 12/04/2022 1240   PROTEINUR TRACE (A) 12/04/2022 1240   UROBILINOGEN 1.0 08/10/2016 1207   NITRITE NEGATIVE 12/04/2022  1240   LEUKOCYTESUR NEGATIVE 12/04/2022 1240     Radiological Exams on Admission: CT Angio Chest PE W and/or Wo Contrast Result Date: 12/29/2023 CLINICAL DATA:  Concern for embolism. EXAM: CT ANGIOGRAPHY CHEST WITH CONTRAST TECHNIQUE: Multidetector CT imaging of the chest was performed using the standard protocol during bolus administration of intravenous contrast. Multiplanar CT image reconstructions and MIPs were obtained to evaluate the vascular anatomy. RADIATION DOSE REDUCTION: This exam was performed according to the departmental dose-optimization program which includes automated exposure control, adjustment of the mA and/or kV according to patient size and/or use of iterative reconstruction technique. CONTRAST:  80mL OMNIPAQUE  IOHEXOL  350 MG/ML SOLN COMPARISON:  Chest CT dated 10/31/2023. FINDINGS: Cardiovascular: There is no cardiomegaly or pericardial effusion. There is coronary vascular calcification. Mild atherosclerotic calcification of the thoracic aorta. No aneurysmal dilatation or dissection. Bilateral lower lobe pulmonary artery emboli involving the lobar and segmental branches. No evidence of right heart straining. Right-sided Port-A-Cath with tip at the cavoatrial junction. Mediastinum/Nodes: Mildly enlarged right hilar lymph node measures 13 mm short axis. No mediastinal adenopathy. The esophagus is grossly unremarkable. No mediastinal fluid collection. Lungs/Pleura: Trace left pleural effusion. There are bibasilar subpleural atelectasis. Pneumonia is not excluded. There is no pneumothorax. The central airways are patent. Upper Abdomen: Better evaluated on CT of 10/31/2023. Musculoskeletal: Osteopenia with degenerative changes spine. Similar appearance of T8 compression fracture. No new fracture. Review of the MIP images confirms the above findings. IMPRESSION: 1. Bilateral lower lobe pulmonary artery emboli. No evidence of right heart straining. 2. Trace left pleural effusion and bibasilar subpleural atelectasis. Pneumonia is not  excluded. 3.  Aortic Atherosclerosis (ICD10-I70.0). These results were called by telephone at the time of interpretation on 12/29/2023 at 4:55 pm to provider [facility] , who verbally acknowledged these results. Electronically Signed   By: Vanetta Chou M.D.   On: 12/29/2023 17:00      Assessment/Plan    Principal Problem:   Acute pulmonary embolism without acute cor pulmonale (HCC) Active Problems:   Paroxysmal atrial fibrillation (HCC)   Acquired hypothyroidism   Cough   Chronic diarrhea   Thrombocytopenia (HCC)   Gastric cancer (HCC)   Chronic diastolic CHF (congestive heart failure) (HCC)   GERD (gastroesophageal reflux disease)   Anemia of chronic disease     #) Acute bilateral pulmonary emboli: In the setting of presenting 2 to 3 days of new onset productive cough associated with blood-tinged sputum, shortness of breath, CTA chest performed at [facility] today showed evidence of acute bilateral pulmonary emboli involving the bilateral lower lobes, without CTA evidence of right heart strain.   He has a history of pulmonary emboli in March 2024, having completed course of anticoagulation, before transitioning in April '25 to prophylactic dosing of Lovenox  for ongoing thromboembolic prophylaxis in the setting of a history of paroxysmal atrial fibrillation.  His new acute bilateral pulmonary emboli appear provoked in the setting of his hypercoagulable state stemming from his metastatic gastric cancer.  No evidence of hypotension to warrant consideration for tPA.  No evidence of acute respiratory distress.   Heparin  drip was started at [facility] this evening.  His presenting hemoglobin is within his baseline.  However, we will continue to closely monitor and seeing hemoglobin trend given the patient's history of acute gastrointestinal bleed and April 2024 along with his report of some blood-tinged sputum associated with his new productive cough over the last 2 to 3 days.  Per  my discussion with the patient, no recent melena,  hematochezia, abdominal discomfort or hematemesis to increase index of suspicion of recurrence of his gastrointestinal bleed at this time.  Plan: Continue heparin  drip.  Echocardiogram in the morning.  Monitor on telemetry.  Monitor strict I's and O's and daily weights.  I ordered every 4 hour H&H's through 9 AM on 12/30/2023, as above.  Repeat CBC in the morning.  Lactated Ringer 's at 100 cc/h x 12 hours.  Type and screen ordered.                  #) Thrombocytopenia: Presenting platelet count 107, down slightly from most recent prior value of 156 on 12/26/2023 followed by most recent chemotherapy session on 12/27/2023.  Differential includes potential contribution from interval chemotherapy versus consumptive contribution in setting of his presenting acute pulmonary embolism with blood-tinged sputum.  It is noted that this low platelet count was found prior to initiation of heparin  drip and that his erythematous rash is been present for at least the last 2 weeks.  Consequently, physical exam findings and laboratory results to do not appear to be suggestive of HIT.  Will continue to closely monitor in setting platelet count, as outlined below.  Additionally, presenting INR was within normal limits at 1.1.  Clinically, presentation appears less suggestive of DIC at this time.   Plan: Repeat CBC in the morning.  Repeat INR in the morning.                 #) Bibasilar airspace opacities: Today's CTA chest showed evidence of bibasilar airspace opacities, which, per radiology, may represent atelectasis versus pneumonia. Will further evaluate by checking procalcitonin level.  In the context of the patient's presenting 2 to 3 days of new onset productive cough associated with mild shortness of breath, as well as his immunocompromise state in the setting of underlying metastatic gastric cancer for which he is undergoing chemotherapy, will  start azithromycin  and Rocephin  at this time, while waiting procalcitonin resolved.  Of note, in the absence of leukocytosis and in the absence of objective fever, SIRS criteria for sepsis are not currently met.  Consequently, lactic acid level and blood cultures have not been checked prior to initiation of azithromycin  and Rocephin .  Of note, COVID, evidence of MRSA PCR were all negative when checked earlier this evening.  Plan: Add on procalcitonin level.  Start azithromycin , Rocephin , as above.  As needed Tessalon  Perles.  Scheduled guaifenesin .  Repeat CBC in the morning.  incentive spirometry.  As needed Xopenex  nebulizer.                  #) Chronic diarrhea: Documented history of chronic diarrhea in the setting of TPN, without any recent increase in stool output or change in nature of the patient's stooling, including no recent melena or hematochezia.  Clinically, presentation appears less suggestive of C. difficile, given the chronic nature of his diarrhea from ongoing TPN as well as the absence of any recent abdominal discomfort, afebrile, no leukocytosis.  Consequently, we will refrain from infectious stool analysis at this time.  Plan: Monitor strict I's and O's and daily weights.  Repeat CBC in the morning.                   #) Metastatic gastric cancer: For which the patient is undergoing chemotherapy, with most recent chemotherapy occurring on 12/27/2023.  Follows with Dr. Cloretta as his outpatient oncologist.  Has been undergoing TPN since April 2025.  Plan:  I've added his outpatient  oncologist, Dr. Cloretta, to the treatment team starting at 0700 on 8/24.  Repeat CBC in the morning.  As needed IV Zofran . I've ordered pharmacy consult for assistance with resumption of TPN.                    #) acquired hypothyroidism: documented h/o such, on Synthroid  as outpatient.   Plan: cont home Synthroid .                         #) Paroxysmal atrial fibrillation: Documented history of such. In setting of CHA2DS2-VASc score of  3, there is an indication for chronic anticoagulation for thromboembolic prophylaxis. Consistent with this, patient is chronically anticoagulated on prophylactic dose of Lovenox  40 mg SQ daily since April 2025.  However, in the setting of his presenting acute bilateral pulmonary emboli this evening, his prophylactic dose of Lovenox  is being transition to full dose anticoagulation via heparin  drip, as further detailed above. home AV nodal blocking regimen: none.  Most recent echocardiogram occurred in February 2025 and was notable for LVEF 60 to 65%, grade 2 diastolic dysfunction, mildly dilated left atrium and no evidence of significant valvular pathology. Presenting EKG sinus rhythm without overt evidence of acute ischemic changes.   Plan: monitor strict I's & O's and daily weights. CMP/CBC in AM. Check serum mag level.  Holding prophylactic subcu Lovenox  for now in the setting of heparin  drip, which has been initiated as anticoagulation for his presenting acute bilateral pulmonary emboli.  Monitor on telemetry.                       #) Chronic diastolic heart failure: documented history of such, with most recent echocardiogram performed will performed in February 2025 notable for LVEF 60 to 65%, grade 2 diastolic dysfunction, normal right ventricular systolic function, with additional results as conveyed above. No clinical or radiographic evidence to suggest acutely decompensated heart failure at this time including CTA chest which showed no evidence of pulmonary or interstitial edema.  His presenting proBNP is noted to be 550, but without any previous proBNP data points available for point comparison.  Home diuretic regimen reportedly consists of the following: None.  Will initiate gentle IV fluids for hemodynamic and preload support in the  setting of his presenting acute bilateral pulmonary emboli, and will closely monitor for ensuing evidence of development of volume overload, as further detailed below.  Plan: monitor strict I's & O's and daily weights. Repeat CMP in AM. Check serum mag level.  Check BMP in the morning.  Will follow for result of echocardiogram, which has been ordered for the morning in the setting of acute bilateral pulmonary emboli.                     #) GERD: documented h/o such; on Protonix  twice daily as outpatient.   Plan: continue home PPI.                    #) Anemia of chronic disease: Documented history of such, a/w with baseline hgb range 9-12, with presenting hgb consistent with this range, in the absence of any overt evidence of active GI bleed.  However, in the setting of a history of gastrointestinal bleed in April 2025 along with recent blood-tinged sputum in the context of new diagnosis of acute bilateral pulmonary emboli prompting the initiation of heparin  drip this evening, will closely monitor and seeing  hemoglobin trend as outlined below.   Plan: Repeat CBC in the morning.  Check updated H&H now followed by every 4 H&H's ordered through 9 AM on 12/30/2023.  CBC in the morning.  Repeat INR in the morning.      DVT prophylaxis: Heparin  drip, as above Code Status: Full code Family Communication: none Disposition Plan: Per Rounding Team Consults called: I've added his outpatient oncologist, Dr. Cloretta, to the treatment team starting at 0700 on 8/24. ;  Admission status: Inpatient    I SPENT GREATER THAN 75  MINUTES IN CLINICAL CARE TIME/MEDICAL DECISION-MAKING IN COMPLETING THIS ADMISSION.     Eva NOVAK Jazzmin Newbold DO [facility] From 7PM - 7AM   12/29/2023, 8:49 PM

## 2023-12-30 ENCOUNTER — Observation Stay (HOSPITAL_COMMUNITY)

## 2023-12-30 DIAGNOSIS — I82411 Acute embolism and thrombosis of right femoral vein: Secondary | ICD-10-CM | POA: Diagnosis present

## 2023-12-30 DIAGNOSIS — R042 Hemoptysis: Secondary | ICD-10-CM | POA: Diagnosis present

## 2023-12-30 DIAGNOSIS — L89152 Pressure ulcer of sacral region, stage 2: Secondary | ICD-10-CM | POA: Diagnosis present

## 2023-12-30 DIAGNOSIS — K769 Liver disease, unspecified: Secondary | ICD-10-CM | POA: Diagnosis present

## 2023-12-30 DIAGNOSIS — I2690 Septic pulmonary embolism without acute cor pulmonale: Secondary | ICD-10-CM | POA: Diagnosis not present

## 2023-12-30 DIAGNOSIS — E039 Hypothyroidism, unspecified: Secondary | ICD-10-CM | POA: Diagnosis present

## 2023-12-30 DIAGNOSIS — Z66 Do not resuscitate: Secondary | ICD-10-CM | POA: Diagnosis present

## 2023-12-30 DIAGNOSIS — J45909 Unspecified asthma, uncomplicated: Secondary | ICD-10-CM | POA: Diagnosis present

## 2023-12-30 DIAGNOSIS — E43 Unspecified severe protein-calorie malnutrition: Secondary | ICD-10-CM | POA: Diagnosis present

## 2023-12-30 DIAGNOSIS — C786 Secondary malignant neoplasm of retroperitoneum and peritoneum: Secondary | ICD-10-CM | POA: Diagnosis present

## 2023-12-30 DIAGNOSIS — Z7189 Other specified counseling: Secondary | ICD-10-CM

## 2023-12-30 DIAGNOSIS — R188 Other ascites: Secondary | ICD-10-CM | POA: Diagnosis present

## 2023-12-30 DIAGNOSIS — D696 Thrombocytopenia, unspecified: Secondary | ICD-10-CM | POA: Diagnosis present

## 2023-12-30 DIAGNOSIS — R578 Other shock: Secondary | ICD-10-CM | POA: Diagnosis present

## 2023-12-30 DIAGNOSIS — C160 Malignant neoplasm of cardia: Secondary | ICD-10-CM | POA: Diagnosis not present

## 2023-12-30 DIAGNOSIS — I5032 Chronic diastolic (congestive) heart failure: Secondary | ICD-10-CM | POA: Diagnosis present

## 2023-12-30 DIAGNOSIS — Z7901 Long term (current) use of anticoagulants: Secondary | ICD-10-CM | POA: Diagnosis not present

## 2023-12-30 DIAGNOSIS — D638 Anemia in other chronic diseases classified elsewhere: Secondary | ICD-10-CM | POA: Diagnosis present

## 2023-12-30 DIAGNOSIS — I2699 Other pulmonary embolism without acute cor pulmonale: Secondary | ICD-10-CM | POA: Diagnosis present

## 2023-12-30 DIAGNOSIS — I480 Paroxysmal atrial fibrillation: Secondary | ICD-10-CM | POA: Diagnosis present

## 2023-12-30 DIAGNOSIS — Z1152 Encounter for screening for COVID-19: Secondary | ICD-10-CM | POA: Diagnosis not present

## 2023-12-30 DIAGNOSIS — D6859 Other primary thrombophilia: Secondary | ICD-10-CM | POA: Diagnosis present

## 2023-12-30 DIAGNOSIS — L89312 Pressure ulcer of right buttock, stage 2: Secondary | ICD-10-CM | POA: Diagnosis present

## 2023-12-30 DIAGNOSIS — Z515 Encounter for palliative care: Secondary | ICD-10-CM | POA: Diagnosis not present

## 2023-12-30 DIAGNOSIS — C169 Malignant neoplasm of stomach, unspecified: Secondary | ICD-10-CM | POA: Diagnosis present

## 2023-12-30 DIAGNOSIS — I82431 Acute embolism and thrombosis of right popliteal vein: Secondary | ICD-10-CM | POA: Diagnosis present

## 2023-12-30 DIAGNOSIS — I700 Atherosclerosis of aorta: Secondary | ICD-10-CM | POA: Diagnosis present

## 2023-12-30 DIAGNOSIS — J9811 Atelectasis: Secondary | ICD-10-CM | POA: Diagnosis present

## 2023-12-30 DIAGNOSIS — Z86711 Personal history of pulmonary embolism: Secondary | ICD-10-CM | POA: Diagnosis not present

## 2023-12-30 LAB — CBC WITH DIFFERENTIAL/PLATELET
Abs Granulocyte: 3 K/uL (ref 1.5–6.5)
Abs Immature Granulocytes: 0.01 K/uL (ref 0.00–0.07)
Basophils Absolute: 0 K/uL (ref 0.0–0.1)
Basophils Relative: 1 %
Eosinophils Absolute: 0.4 K/uL (ref 0.0–0.5)
Eosinophils Relative: 9 %
HCT: 28.8 % — ABNORMAL LOW (ref 39.0–52.0)
Hemoglobin: 8.9 g/dL — ABNORMAL LOW (ref 13.0–17.0)
Immature Granulocytes: 0 %
Lymphocytes Relative: 16 %
Lymphs Abs: 0.7 K/uL (ref 0.7–4.0)
MCH: 28.7 pg (ref 26.0–34.0)
MCHC: 30.9 g/dL (ref 30.0–36.0)
MCV: 92.9 fL (ref 80.0–100.0)
Monocytes Absolute: 0.2 K/uL (ref 0.1–1.0)
Monocytes Relative: 4 %
Neutro Abs: 3 K/uL (ref 1.7–7.7)
Neutrophils Relative %: 70 %
Platelets: 100 K/uL — ABNORMAL LOW (ref 150–400)
RBC: 3.1 MIL/uL — ABNORMAL LOW (ref 4.22–5.81)
RDW: 20.1 % — ABNORMAL HIGH (ref 11.5–15.5)
WBC: 4.3 K/uL (ref 4.0–10.5)
nRBC: 0 % (ref 0.0–0.2)

## 2023-12-30 LAB — COMPREHENSIVE METABOLIC PANEL WITH GFR
ALT: 20 U/L (ref 0–44)
AST: 12 U/L — ABNORMAL LOW (ref 15–41)
Albumin: 1.8 g/dL — ABNORMAL LOW (ref 3.5–5.0)
Alkaline Phosphatase: 252 U/L — ABNORMAL HIGH (ref 38–126)
Anion gap: 10 (ref 5–15)
BUN: 19 mg/dL (ref 8–23)
CO2: 23 mmol/L (ref 22–32)
Calcium: 7.8 mg/dL — ABNORMAL LOW (ref 8.9–10.3)
Chloride: 105 mmol/L (ref 98–111)
Creatinine, Ser: 0.69 mg/dL (ref 0.61–1.24)
GFR, Estimated: >60 mL/min (ref >60)
Glucose, Bld: 93 mg/dL (ref 70–99)
Potassium: 3.7 mmol/L (ref 3.5–5.1)
Sodium: 138 mmol/L (ref 135–145)
Total Bilirubin: 0.7 mg/dL (ref 0.0–1.2)
Total Protein: 4.8 g/dL — ABNORMAL LOW (ref 6.5–8.1)

## 2023-12-30 LAB — ECHOCARDIOGRAM COMPLETE
AR max vel: 0.94 cm2
AV Area VTI: 1.01 cm2
AV Area mean vel: 0.92 cm2
AV Mean grad: 28 mmHg
AV Peak grad: 48.2 mmHg
Ao pk vel: 3.47 m/s
Area-P 1/2: 3.15 cm2
Height: 71 in
S' Lateral: 3.5 cm
Weight: 3200 [oz_av]

## 2023-12-30 LAB — HEPARIN LEVEL (UNFRACTIONATED)
Heparin Unfractionated: 0.35 [IU]/mL (ref 0.30–0.70)
Heparin Unfractionated: 0.3 [IU]/mL (ref 0.30–0.70)
Heparin Unfractionated: 0.41 [IU]/mL (ref 0.30–0.70)

## 2023-12-30 LAB — PROCALCITONIN: Procalcitonin: 0.29 ng/mL

## 2023-12-30 LAB — PROTIME-INR
INR: 1.3 — ABNORMAL HIGH (ref 0.8–1.2)
Prothrombin Time: 16.7 s — ABNORMAL HIGH (ref 11.4–15.2)

## 2023-12-30 LAB — HEMOGLOBIN AND HEMATOCRIT, BLOOD
HCT: 29.2 % — ABNORMAL LOW (ref 39.0–52.0)
HCT: 27.8 % — ABNORMAL LOW (ref 39.0–52.0)
Hemoglobin: 8.8 g/dL — ABNORMAL LOW (ref 13.0–17.0)
Hemoglobin: 8.8 g/dL — ABNORMAL LOW (ref 13.0–17.0)

## 2023-12-30 LAB — GLUCOSE, CAPILLARY
Glucose-Capillary: 84 mg/dL (ref 70–99)
Glucose-Capillary: 106 mg/dL — ABNORMAL HIGH (ref 70–99)

## 2023-12-30 LAB — TSH: TSH: 8.254 u[IU]/mL — ABNORMAL HIGH (ref 0.350–4.500)

## 2023-12-30 LAB — MAGNESIUM
Magnesium: 2 mg/dL (ref 1.7–2.4)
Magnesium: 2.1 mg/dL (ref 1.7–2.4)

## 2023-12-30 LAB — PHOSPHORUS: Phosphorus: 2.2 mg/dL — ABNORMAL LOW (ref 2.5–4.6)

## 2023-12-30 LAB — BRAIN NATRIURETIC PEPTIDE: B Natriuretic Peptide: 116.1 pg/mL — ABNORMAL HIGH (ref 0.0–100.0)

## 2023-12-30 LAB — TRIGLYCERIDES: Triglycerides: 102 mg/dL (ref <150)

## 2023-12-30 MED ORDER — INSULIN ASPART 100 UNIT/ML IJ SOLN: 0.0000 [IU] | Freq: Four times a day (QID) | INTRAMUSCULAR | Status: DC

## 2023-12-30 MED ORDER — POTASSIUM PHOSPHATES 15 MMOLE/5ML IV SOLN
15.0000 mmol | Freq: Once | INTRAVENOUS | Status: AC
  Administered 2023-12-30: 15 mmol via INTRAVENOUS
  Filled 2023-12-30: qty 5

## 2023-12-30 MED ORDER — HYDROXYZINE HCL 25 MG PO TABS
25.0000 mg | ORAL_TABLET | Freq: Three times a day (TID) | ORAL | Status: DC | PRN
  Administered 2023-12-30 – 2023-12-31 (×4): 25 mg via ORAL
  Filled 2023-12-30 (×4): qty 1

## 2023-12-30 MED ORDER — LACTATED RINGERS IV SOLN: INTRAVENOUS | Status: DC

## 2023-12-30 MED ORDER — ORAL CARE MOUTH RINSE
15.0000 mL | OROMUCOSAL | Status: DC | PRN
Start: 2023-12-30 — End: 2024-01-04

## 2023-12-30 MED ORDER — ENSURE PLUS HIGH PROTEIN PO LIQD: 237.0000 mL | Freq: Two times a day (BID) | ORAL | Status: DC

## 2023-12-30 MED ORDER — TRAVASOL 10 % IV SOLN
INTRAVENOUS | Status: AC
  Filled 2023-12-30: qty 528

## 2023-12-30 MED ORDER — CLOBETASOL PROPIONATE 0.05 % EX OINT
TOPICAL_OINTMENT | Freq: Two times a day (BID) | CUTANEOUS | Status: DC | PRN
  Filled 2023-12-30: qty 15

## 2023-12-30 NOTE — Hospital Course (Addendum)
 78 y.o. male with medical history significant for metastatic gastric cancer on chemotherapy (follows with Dr. Cloretta), paroxysmal atrial fibrillation, bilateral pulmonary embolism with concurrent DVT of the bilateral lower extremities 07/2022, on Lovenox  40 mg subcutaneous daily since 05/2023, hospitalization for upper gastrointestinal hemorrhage with hemorrhagic shock 08/2023 requiring ICU management, chronic diastolic heart failure, anemia of chronic disease, chronic diarrhea who presented to [facility] emergency department for evaluation from home after developing cough.  Upon evaluation at [facility] emergency department CT angiogram of the chest was performed revealing bilateral lower lobe pulmonary emboli without evidence of right heart strain.  Patient was initiated on a heparin  infusion.  The hospitalist group was called and patient was accepted for transfer to [facility] for continued medical care.

## 2023-12-30 NOTE — Assessment & Plan Note (Signed)
 Stable, monitor daily

## 2023-12-30 NOTE — Assessment & Plan Note (Signed)
 Lengthy discussion surrounding patient's extremely guarded prognosis.  Presence of gastric mass and history of hemorrhage related to this mass places patient at extremely high risk of recurrent hemorrhage now that he is on full dose anticoagulation. At this point he wishes to pursue continued anticoagulation I recommended consideration of palliative care consultation to discuss options in case his clinical condition declines.  He has agreed and the consultation will be placed. He is still deliberating on whether his CODE STATUS should be changed and is discussing with family.  Currently full code.

## 2023-12-30 NOTE — Assessment & Plan Note (Signed)
 Managing currently with heparin  infusion due to its short half-life in case there is a recurrent hemorrhage. Recurrent thromboembolic disease as patient is suffered from both pulmonary emboli and DVTs in 2024 Etiology likely secondary to concurrent malignancy and subtherapeutic doses of subcutaneous Lovenox  Case discussed with Dr. Tina with oncology who who is following along consultation, his assistance is appreciated.  Case will also be reviewed by Dr. Cloretta for primary oncologist when he returns to the clinic on 8/25. Overall, full-dose anticoagulation carries a high risk of recurrent bleeding considering patient's gastric malignancy.

## 2023-12-30 NOTE — Assessment & Plan Note (Signed)
 Admitting provider placed patient on Protonix  40 mg IV every 12 hours.  Will continue for now

## 2023-12-30 NOTE — Progress Notes (Signed)
 PHARMACY - ANTICOAGULATION CONSULT NOTE  Pharmacy Consult for heparin  Indication: pulmonary embolus  Allergies  Allergen Reactions   Gold Itching   Mixed Grasses     unknown   Hydroxyquinolines Rash    Patient Measurements: Height: 5' 11 (180.3 cm) Weight: 90.7 kg (200 lb) IBW/kg (Calculated) : 75.3 HEPARIN  DW (KG): 90.7  Vital Signs: Temp: 98.2 F (36.8 C) (08/24 0824) Temp Source: Oral (08/24 0824) BP: 102/62 (08/24 0824) Pulse Rate: 71 (08/24 0824)  Labs: Recent Labs    12/29/23 1544 12/29/23 2055 12/30/23 0152 12/30/23 0940  HGB 10.5* 9.8* 8.9*  8.8* 8.8*  HCT 32.5* 32.9* 28.8*  29.2* 27.8*  PLT 107*  --  100*  --   LABPROT 14.4  --  16.7*  --   INR 1.1  --  1.3*  --   HEPARINUNFRC  --   --  0.35 0.30  CREATININE 0.83  --  0.69  --     Estimated Creatinine Clearance: 92 mL/min (by C-G formula based on SCr of 0.69 mg/dL).   Medical History: Past Medical History:  Diagnosis Date   A-fib (HCC)    Asthma    Cataract    Gastric cancer (HCC)    GERD (gastroesophageal reflux disease)    Lichen planus    Methotrexate, long term, current use    no longer taking   Psoriasis    Sleep apnea    Thyroid  disease    Urticaria     Assessment: 41 YOM presenting with dyspnea, hx gastric cancer, acute GIB in April 2025, anemia of chronic disease, and VTE on lovenox  PTA (DVT ppx dosing 40mg  daily), missed some doses recently including today (none today). CT angio chest revealed new PE with no right heart strain present. Pharmacy consulted to start heparin .   Today, 12/30/2023 cHL 0.3 -therapeutic (on lower end of range) on 1500 units/hr Hgb low stable - 8s (BL 9-12) PLTc low stable - 100 No issues with heparin  infusion or signs of bleeding per RN   Goal of Therapy:  Heparin  level 0.3-0.7 units/ml Monitor platelets by anticoagulation protocol: Yes   Plan:  Slightly increase heparin  drip to 1600 units/hr Check heparin  level in 8 hours Daily CBC F/u long  term AC plan  Thank you for allowing pharmacy to be a part of this patient's care.  Marget Hench, PharmD Clinical Pharmacist 8/24/202510:54 AM

## 2023-12-30 NOTE — Assessment & Plan Note (Addendum)
 Several systemic treatment in the past. Started FOLFOX plus zolbetuximab 11/14/2023 now completed 4 cycles. Will continue follow-up with Dr. Cloretta

## 2023-12-30 NOTE — Progress Notes (Addendum)
 PROGRESS NOTE   Todd Harrison  FMW:[id number] DOB: 09/12/1945 DOA: 12/29/2023 PCP: Henry Ingle, MD   Date of Service: the patient was seen and examined on 12/30/2023  Brief Narrative:  78 y.o. male with medical history significant for metastatic gastric cancer on chemotherapy (follows with Dr. Cloretta), paroxysmal atrial fibrillation, bilateral pulmonary embolism with concurrent DVT of the bilateral lower extremities 07/2022, on Lovenox  40 mg subcutaneous daily since 05/2023, hospitalization for upper gastrointestinal hemorrhage with hemorrhagic shock 08/2023 requiring ICU management, chronic diastolic heart failure, anemia of chronic disease, chronic diarrhea who presented to [facility] emergency department for evaluation from home after developing cough.  Upon evaluation at [facility] emergency department CT angiogram of the chest was performed revealing bilateral lower lobe pulmonary emboli without evidence of right heart strain.  Patient was initiated on a heparin  infusion.  The hospitalist group was called and patient was accepted for transfer to [facility] for continued medical care.      Assessment & Plan Acute pulmonary embolism without acute cor pulmonale (HCC) Managing currently with heparin  infusion due to its short half-life in case there is a recurrent hemorrhage. Recurrent thromboembolic disease as patient is suffered from both pulmonary emboli and DVTs in 2024 Etiology likely secondary to concurrent malignancy and subtherapeutic doses of subcutaneous Lovenox  Case discussed with Dr. Tina with oncology who who is following along consultation, his assistance is appreciated.  Case will also be reviewed by Dr. Cloretta for primary oncologist when he returns to the clinic on 8/25. Overall, full-dose anticoagulation carries a high risk of recurrent bleeding considering patient's gastric malignancy. Paroxysmal atrial fibrillation (HCC) Currently rate  controlled, not on any AV nodal blocking agents Anticoagulation as above Monitoring on telemetry Acquired hypothyroidism Continue Synthroid  Cough with hemoptysis Secondary to pulmonary embolism, potential to be exacerbated by anticoagulation Scant amounts, will monitor for now. Thrombocytopenia (HCC) Not severe at this point Continue to monitor with serial CBCs Likely secondary to ongoing chemotherapy Gastric cancer (HCC) Gastric malignancy with gastric mass last seen via 08/2023 EGD  Follows with Dr. Cloretta Currently receiving chemotherapy with FOLFOX Presence epigastric mass that has bled before proves problematic in the setting of full dose anticoagulation Chronic diastolic CHF (congestive heart failure) (HCC) No clinical evidence of cardiogenic volume overload Strict input and output monitoring Daily weights Low-sodium diet  GERD (gastroesophageal reflux disease) Admitting provider placed patient on Protonix  40 mg IV every 12 hours.  Will continue for now Anemia of chronic disease Will obtain iron panel, folate, vitamin B12, ferritin Monitoring hemoglobin and hematocrit closely with serial CBCs. Goals of care, counseling/discussion Lengthy discussion surrounding patient's extremely guarded prognosis.  Presence of gastric mass and history of hemorrhage related to this mass places patient at extremely high risk of recurrent hemorrhage now that he is on full dose anticoagulation. At this point he wishes to pursue continued anticoagulation I recommended consideration of palliative care consultation to discuss options in case his clinical condition declines.  He has agreed and the consultation will be placed. He is still deliberating on whether his CODE STATUS should be changed and is discussing with family.  Currently full code.     Subjective:  Patient complaining of continued cough productive with scant amounts of blood.  Patient denies chest pain or shortness of breath at  this point.  Physical Exam:  Vitals:   12/29/23 1945 12/29/23 2040 12/30/23 0054 12/30/23 0522  BP: (!) 97/59 97/62 (!) 89/58 (!) 96/59  Pulse: 84 90 78 70  Resp: (!) 23 18 20 18   Temp:  99.2 F (37.3 C) 98.2 F (36.8 C) 98.3 F (36.8 C)  TempSrc:  Oral Oral Oral  SpO2: 93% 99% 95% 96%  Weight:      Height:        Constitutional: Awake alert and oriented x3, no associated distress.   Skin: no rashes, no lesions, good skin turgor noted. Eyes: Pupils are equally reactive to light.  No evidence of scleral icterus or conjunctival pallor.  ENMT: Moist mucous membranes noted.  Posterior pharynx clear of any exudate or lesions.   Respiratory: clear to auscultation bilaterally, no wheezing, no crackles. Normal respiratory effort. No accessory muscle use.  Cardiovascular: Regular rate and rhythm, no murmurs / rubs / gallops. No extremity edema. 2+ pedal pulses. No carotid bruits.  Abdomen: Abdomen is soft and nontender.  No evidence of intra-abdominal masses.  Positive bowel sounds noted in all quadrants.   Musculoskeletal: No joint deformity upper and lower extremities. Good ROM, no contractures. Normal muscle tone.    Data Reviewed:  I have personally reviewed and interpreted labs, imaging.  Significant findings are   CBC: Recent Labs  Lab 12/26/23 1328 12/29/23 1544 12/29/23 2055 12/30/23 0152  WBC 5.3 5.2  --  4.3  NEUTROABS 2.9 4.1  --  3.0  HGB 11.2* 10.5* 9.8* 8.9*  8.8*  HCT 35.4* 32.5* 32.9* 28.8*  29.2*  MCV 90.8 89.8  --  92.9  PLT 156 107*  --  100*   Basic Metabolic Panel: Recent Labs  Lab 12/26/23 1328 12/29/23 0152 12/29/23 1544 12/30/23 0152  NA 140  --  137 138  K 3.9  --  3.8 3.7  CL 104  --  104 105  CO2 24  --  22 23  GLUCOSE 112*  --  110* 93  BUN 22  --  23 19  CREATININE 0.77  --  0.83 0.69  CALCIUM  8.9  --  8.3* 7.8*  MG 2.2 2.0  --  2.1  PHOS 3.1  --   --  2.2*   GFR: Estimated Creatinine Clearance: 92 mL/min (by C-G formula based  on SCr of 0.69 mg/dL). Liver Function Tests: Recent Labs  Lab 12/26/23 1328 12/29/23 1544 12/30/23 0152  AST 71* 20 12*  ALT 61* 28 20  ALKPHOS 701* 388* 252*  BILITOT 0.6 0.6 0.7  PROT 5.6* 5.3* 4.8*  ALBUMIN 3.1* 2.8* 1.8*    Coagulation Profile: Recent Labs  Lab 12/29/23 1544 12/30/23 0152  INR 1.1 1.3*      Code Status:  Full code.  Code status decision has been confirmed with: patient Family Communication: Plan of care discussed with wife and daughter at the bedside.   Severity of Illness:  The appropriate patient status for this patient is INPATIENT. Inpatient status is judged to be reasonable and necessary in order to provide the required intensity of service to ensure the patient's safety. The patient's presenting symptoms, physical exam findings, and initial radiographic and laboratory data in the context of their chronic comorbidities is felt to place them at high risk for further clinical deterioration. Furthermore, it is not anticipated that the patient will be medically stable for discharge from the hospital within 2 midnights of admission.   * I certify that at the point of admission it is my clinical judgment that the patient will require inpatient hospital care spanning beyond 2 midnights from the point of admission due to high intensity of service, high risk for  further deterioration and high frequency of surveillance required.*  Time spent:  61 minutes  Author:  Zachary JINNY Ba MD  12/30/2023 8:01 AM

## 2023-12-30 NOTE — Consult Note (Signed)
 WOC Nurse Consult Note: Reason for Consult:Stage 2 Pressure Injury buttock Wound type: Stage 2 Pressure Injury R buttocks and Sacrum  Pressure Injury POA: Yes Measurement: Sacrum 1 cm x 1 cm x 0.1 cm 100% red moist; R upper buttock 0.5 cm x 0.5 cm x 0.1 cm 100% red, R mid buttock 0.5 cm x 0.5 cm x 0.1 cm 100% red  Wound bed: as above  Drainage (amount, consistency, odor) minimal serosanguinous  Periwound: mild erythema  Dressing procedure/placement/frequency: Cleanse sacral/buttocks wounds with Vashe wound cleanser, do not rinse. Apply Xeroform gauze ([place] [phone]) to wound beds daily and cover with silicone foam to secure.   POC discussed with bedside nurse. WOC team will not follow. Reconsult if further needs arise.   Thank you,    Powell Bar MSN, RN-BC, [employer]

## 2023-12-30 NOTE — Assessment & Plan Note (Signed)
 Secondary to pulmonary embolism, potential to be exacerbated by anticoagulation Scant amounts, will monitor for now.

## 2023-12-30 NOTE — Assessment & Plan Note (Signed)
 Stable, expected with chemotherapy

## 2023-12-30 NOTE — Assessment & Plan Note (Signed)
 Will obtain iron panel, folate, vitamin B12, ferritin Monitoring hemoglobin and hematocrit closely with serial CBCs.

## 2023-12-30 NOTE — Progress Notes (Signed)
 PHARMACY - ANTICOAGULATION CONSULT NOTE  Pharmacy Consult for heparin  Indication: pulmonary embolus  Allergies  Allergen Reactions   Gold Itching   Mixed Grasses     unknown   Hydroxyquinolines Rash    Patient Measurements: Height: 5' 11 (180.3 cm) Weight: 90.7 kg (200 lb) IBW/kg (Calculated) : 75.3 HEPARIN  DW (KG): 90.7  Vital Signs: Temp: 97.9 F (36.6 C) (08/24 1432) Temp Source: Oral (08/24 1432) BP: 103/62 (08/24 1432) Pulse Rate: 73 (08/24 1432)  Labs: Recent Labs    12/29/23 1544 12/29/23 2055 12/30/23 0152 12/30/23 0940 12/30/23 2012  HGB 10.5* 9.8* 8.9*  8.8* 8.8*  --   HCT 32.5* 32.9* 28.8*  29.2* 27.8*  --   PLT 107*  --  100*  --   --   LABPROT 14.4  --  16.7*  --   --   INR 1.1  --  1.3*  --   --   HEPARINUNFRC  --   --  0.35 0.30 0.41  CREATININE 0.83  --  0.69  --   --     Estimated Creatinine Clearance: 92 mL/min (by C-G formula based on SCr of 0.69 mg/dL).   Medical History: Past Medical History:  Diagnosis Date   A-fib (HCC)    Asthma    Cataract    Gastric cancer (HCC)    GERD (gastroesophageal reflux disease)    Lichen planus    Methotrexate, long term, current use    no longer taking   Psoriasis    Sleep apnea    Thyroid  disease    Urticaria     Assessment: 73 YOM presenting with dyspnea, hx gastric cancer, acute GIB in April 2025, anemia of chronic disease, and VTE on lovenox  PTA (DVT ppx dosing 40mg  daily), missed some doses recently including today (none today). CT angio chest revealed new PE with no right heart strain present. Pharmacy consulted to start heparin .   Today, 12/30/2023 HL 0.41 remains therapeutic with heparin  infusing at 1600 units/hr Hgb low stable - 8s (BL 9-12) PLTc low stable - 100 No complications of therapy noted   Goal of Therapy:  Heparin  level 0.3-0.7 units/ml Monitor platelets by anticoagulation protocol: Yes   Plan:  -Continue heparin  infusion at 1600 units/hr -Daily heparin  level and  CBC -F/u long term AC plan  Thank you for allowing pharmacy to be a part of this patient's care.  Stefano MARLA Bologna, PharmD, BCPS Clinical Pharmacist 12/30/2023 8:53 PM

## 2023-12-30 NOTE — Progress Notes (Signed)
 Initial Nutrition Assessment  DOCUMENTATION CODES:  Not applicable  INTERVENTION:  TPN to meet 100% of pt estimated needs  Management per pharmacy Ensure Plus High Protein po BID, each supplement provides 350 kcal and 20 grams of protein Encourage PO diet as able and tolerated  NUTRITION DIAGNOSIS:  Increased nutrient needs related to cancer and cancer related treatments as evidenced by estimated needs.  GOAL:  Patient will meet greater than or equal to 90% of their needs  MONITOR:  Weight trends, Labs, I & O's  REASON FOR ASSESSMENT:  Consult New TPN/TNA  ASSESSMENT:  78 y.o. male presented to the ED with worsening cough and mild shortness of breath. PMH includes metastatic gastric cancer (currently on chemotherapy), home TPN with [facility], pulmonary embolism, GERD, CHF, and paroxysmal A. Fib. Pt admitted with bilateral pulmonary emboli.   8/23 - Admitted  RD working remotely at time of assessment. Pt currently followed by outpatient RD at [facility]. Started on home TPN in July 2025 due to ongoing weight loss and limited PO intake during course of treatment. RD team received a consult for initiation of TPN while admitted to the hospital, pt currently receives home TPN from [facility] for 18 hours per day. Per notes, pt utilizes oral nutrition supplements to support PO nutrition, RD will order for pt to consume as desired and as tolerated. Also noted that pt has experienced food aversions with certain textures.    Per chart review, pt with ongoing weight loss over the past year. Pt with a 22.2% weight loss since 12/26/22, this is clinically significant for time frame. Suspect pt with degree of malnutrition, unable to diagnosis at this time due to inability to perform physical exam.  Nutrition Related Medications: Protonix , IV antibiotics, Heparin , IV potassium phosphate  Labs: Sodium 138, Potassium 3.7, BUN 19, Creatinine 0.69, Phosphorus 2.2, Magnesium  2.1   NUTRITION -  FOCUSED PHYSICAL EXAM: Deferred to follow-up  Diet Order:   Diet Order             Diet regular Room service appropriate? Yes; Fluid consistency: Thin  Diet effective now                  EDUCATION NEEDS:  No education needs have been identified at this time  Skin:  Skin Assessment: Skin Integrity Issues: Skin Integrity Issues:: Stage II Stage II: sacrum  Last BM:  PTA  Height:  Ht Readings from Last 1 Encounters:  12/29/23 5' 11 (1.803 m)   Weight:  Wt Readings from Last 1 Encounters:  12/29/23 90.7 kg   Ideal Body Weight:  78.2 kg  BMI:  Body mass index is 27.89 kg/m.  Estimated Nutritional Needs:  Kcal:  2200-2400 Protein:  110-130 grams Fluid:  >/= 2 L   Nestora Glatter RD, LDN Clinical Dietitian

## 2023-12-30 NOTE — Assessment & Plan Note (Signed)
 Currently rate controlled and not a concern. Patient has been on chronic anticoagulation

## 2023-12-30 NOTE — Progress Notes (Signed)
 PHARMACY - ANTICOAGULATION CONSULT NOTE  Pharmacy Consult for heparin  Indication: pulmonary embolus  Allergies  Allergen Reactions   Gold Itching   Mixed Grasses     unknown   Hydroxyquinolines Rash    Patient Measurements: Height: 5' 11 (180.3 cm) Weight: 90.7 kg (200 lb) IBW/kg (Calculated) : 75.3 HEPARIN  DW (KG): 90.7  Vital Signs: Temp: 98.2 F (36.8 C) (08/24 0054) Temp Source: Oral (08/24 0054) BP: 89/58 (08/24 0054) Pulse Rate: 78 (08/24 0054)  Labs: Recent Labs    12/29/23 1544 12/29/23 2055 12/30/23 0152  HGB 10.5* 9.8* 8.9*  8.8*  HCT 32.5* 32.9* 28.8*  29.2*  PLT 107*  --  100*  LABPROT 14.4  --  16.7*  INR 1.1  --  1.3*  HEPARINUNFRC  --   --  0.35  CREATININE 0.83  --  0.69    Estimated Creatinine Clearance: 92 mL/min (by C-G formula based on SCr of 0.69 mg/dL).   Medical History: Past Medical History:  Diagnosis Date   A-fib (HCC)    Asthma    Cataract    Gastric cancer (HCC)    GERD (gastroesophageal reflux disease)    Lichen planus    Methotrexate, long term, current use    no longer taking   Psoriasis    Sleep apnea    Thyroid  disease    Urticaria     Assessment: 77 YOM presenting with dyspnea, hx gastric cancer and VTE on lovenox  PTA (DVT ppx dosing 40mg  daily), missed some doses recently including today (none today).  CT angio chest with PE  12/30/2023 HL 0.35 therapeutic on 1500 units/hr Hgb 8.9, plts 100 No bleeding noted   Goal of Therapy:  Heparin  level 0.3-0.7 units/ml Monitor platelets by anticoagulation protocol: Yes   Plan:  Continue heparin  drip at 1500 units/hr Confirmatory heparin  level in 8 hours Daily CBC F/u long term AC plan  Leeroy Mace RPh 12/30/2023, 3:41 AM

## 2023-12-30 NOTE — Assessment & Plan Note (Signed)
 Secondary to acute PE

## 2023-12-30 NOTE — Progress Notes (Signed)
 PHARMACY - TOTAL PARENTERAL NUTRITION CONSULT NOTE   Indication: chronic TPN  Patient Measurements: Height: 5' 11 (180.3 cm) Weight: 90.7 kg (200 lb) IBW/kg (Calculated) : 75.3 TPN AdjBW (KG): 90.7 Body mass index is 27.89 kg/m. Usual Weight:   Assessment:  Patient is a 78yo M with PMH of metastatic gastric cancer undergoing chemotherapy on chronic TPN since July 2025 due to reduced po intake. Patient presented on 12/29/23 from [facility] for BL PE with no RHS. Pharmacy has been consulted to dose TPN while inpatient.   Glucose / Insulin : No hx of Dm, not currently on SSI  CBGs<150 Electrolytes: Phos low, all other lytes WNL including CoCa 9.6  Renal: Scr stable (BL 1-1.1), BUN wnl  Hepatic: AST slightly low, ALKphos elevated but downtrending Albumin (1.8) low  Intake / Output; MIVF:  - UOP: - Stool: none documented - mIVF: LR at 122ml/hr  - expired 8/24 GI Imaging: CT 6/25 - L hepatic lobe lesion suspicion, moderate gastric wall thickening. No gastric outlet obstruction GI Surgeries / Procedures:   Central access: chest port  TPN start date: 12/30/2023  Nutritional Goals: Goal TPN rate is 90 mL/hr (provides 118.8 g of protein and 2224 kcals per day)  RD Assessment: Estimated Needs Total Energy Estimated Needs: 2200-2400 Total Protein Estimated Needs: 110-130 grams Total Fluid Estimated Needs: >/= 2 L  Current Nutrition:  Regular diet   Plan:  Now: 15mmol IV Kphos x1   Today, at 1800 Start TPN at 40 mL/hr at 1800 Electrolytes in TPN: Na 50mEq/L, K 58mEq/L, Ca 12mEq/L, Mg 4mEq/L, and Phos 15mmol/L. Cl:Ac 1:2 Add standard MVI and trace elements to TPN Initiate Sensitive q6h SSI and adjust as needed  Monitor TPN labs on Mon/Thurs, PRN F/u PTA TPN formula   Todd Harrison 12/30/2023,8:57 AM

## 2023-12-30 NOTE — Assessment & Plan Note (Signed)
 Not severe at this point Continue to monitor with serial CBCs Likely secondary to ongoing chemotherapy

## 2023-12-30 NOTE — Assessment & Plan Note (Signed)
 Currently rate controlled, not on any AV nodal blocking agents Anticoagulation as above Monitoring on telemetry

## 2023-12-30 NOTE — Assessment & Plan Note (Signed)
No clinical evidence of cardiogenic volume overload Strict input and output monitoring Daily weights Low-sodium diet  

## 2023-12-30 NOTE — TOC Initial Note (Signed)
 Transition of Care [facility]) - Initial/Assessment Note    Patient Details  Name: Todd Harrison MRN: [id number] Date of Birth: [birth year]  [facility] CM/SW Contact:    Todd Manuella Quill, RN Phone Number: 12/30/2023, 2:11 PM  Clinical Narrative:                 Spoke w/ pt, wife Todd Harrison [phone]),  and family in room; he plans to return at d/c; his wife will provide transportation; pt verified insurance/PCP; he denied SDOH risks; pt does not have DME, or home oxyen; he receives Todd Harrison and TPN from Todd Harrison; pt said he would like to con't services w/ agency at d/c; Todd Harrison at [employer] notified; Todd Harrison is following.  Expected Discharge Plan: Home w Home Health Services Barriers to Discharge: Continued Medical Work up   Patient Goals and CMS Choice Patient states their goals for this hospitalization and ongoing recovery are:: home CMS Medicare.gov Compare Post Acute Care list provided to:: Patient        Expected Discharge Plan and Services   Discharge Planning Services: CM Consult   Living arrangements for the past 2 months: Single Family Home                                      Prior Living Arrangements/Services Living arrangements for the past 2 months: Single Family Home Lives with:: Spouse Patient language and need for interpreter reviewed:: Yes Do you feel safe going back to the place where you live?: Yes      Need for Family Participation in Patient Care: Yes (Comment) Care giver support system in place?: Yes (comment) Current home services: DME (HHRN/TNA w/ [employer]) Criminal Activity/Legal Involvement Pertinent to Current Situation/Hospitalization: No - Comment as needed  Activities of Daily Living   ADL Screening (condition at time of admission) Independently performs ADLs?: Yes (appropriate for developmental age) Is the patient deaf or have difficulty hearing?: No Does the patient have difficulty seeing, even when wearing  glasses/contacts?: No Does the patient have difficulty concentrating, remembering, or making decisions?: No  Permission Sought/Granted Permission sought to share information with : Case Manager Permission granted to share information with : Yes, Verbal Permission Granted  Share Information with NAME: Case Manager     Permission granted to share info w Relationship: Todd Harrison (spouse) [phone]     Emotional Assessment Appearance:: Appears stated age Attitude/Demeanor/Rapport: Gracious Affect (typically observed): Accepting Orientation: : Oriented to Self, Oriented to Place, Oriented to  Time, Oriented to Situation Alcohol  / Substance Use: Not Applicable Psych Involvement: No (comment)  Admission diagnosis:  Acute pulmonary embolism without acute cor pulmonale (HCC) [I26.99] Patient Active Problem List   Diagnosis Date Noted   Chronic anticoagulation 12/30/2023   Acute pulmonary embolism without acute cor pulmonale (HCC) 12/29/2023   Cough 12/29/2023   Pressure injury of skin 12/29/2023   Chronic diarrhea 12/29/2023   Thrombocytopenia (HCC) 12/29/2023   Gastric cancer (HCC) 12/29/2023   Chronic diastolic CHF (congestive heart failure) (HCC) 12/29/2023   GERD (gastroesophageal reflux disease) 12/29/2023   Anemia of chronic disease 12/29/2023   Cervical disc disorder with radiculopathy of cervical region 11/08/2023   ABLA (acute blood loss anemia) 08/17/2023   Hemorrhagic shock (HCC) 08/15/2023   Hematemesis 08/14/2023   Acute upper GI bleed 08/14/2023   Malnutrition of moderate degree 07/25/2022   Hemoptysis 07/25/2022   Infarct of lung (HCC) 07/25/2022  Pulmonary embolus (HCC) 07/25/2022   Acute saddle pulmonary embolism with acute cor pulmonale (HCC) 07/24/2022   Paroxysmal atrial fibrillation (HCC) 07/24/2022   Lichen planus 07/24/2022   Malignant neoplasm of stomach (HCC) 04/26/2022   Aortic atherosclerosis (HCC) 04/17/2022   Abnormal CT of the chest 12/16/2020    COVID-19 virus infection 11/25/2020   OSA (obstructive sleep apnea) 05/19/2020   Persistent atrial fibrillation (HCC)    Daytime hypersomnolence 02/16/2020   Tachycardia 01/22/2020   Atrial fibrillation with RVR (HCC) 01/22/2020   Pneumonia 01/09/2020   Asthmatic bronchitis 10/20/2019   Methotrexate, long term, current use    Cough present for greater than 3 weeks 04/30/2019   Shortness of breath 04/30/2019   Complicated acute bronchitis 04/30/2019   Allergic contact dermatitis 09/09/2018   Acquired hypothyroidism 06/09/2016   Rotator cuff tear, right 09/22/2010   METATARSALGIA 11/24/2008   FOOT PAIN, LEFT 11/24/2008   TALIPES CAVUS 11/24/2008   PCP:  Todd Ingle, MD Pharmacy:   [id number] [address], [place] - [address] [place] [address] Phone: [phone] Fax: [phone]  [facility] [address] [place] [address] Phone: [phone] Fax: [phone]  [facility] [address] [place] [address] Phone: [phone] Fax: [phone]  [facility] [address] [place] [address] Phone: [phone] Fax: [phone]  [id number] - [place], [place] - [address] AT [facility] [address] [place] [address] Phone: [phone] Fax: [phone]     Social Drivers of Health (SDOH) Social History: SDOH Screenings   Food Insecurity: No Food Insecurity (12/30/2023)  Housing: Low Risk  (12/30/2023)  Transportation Needs: No Transportation Needs (12/30/2023)  Utilities: Not At Risk (12/30/2023)  Depression (PHQ2-9): Low Risk  (11/22/2023)  Financial Resource Strain: Low Risk  (04/17/2022)  Social Connections: Moderately Isolated (12/29/2023)  Stress: No Stress Concern Present (04/17/2022)  Tobacco Use: Medium Risk (12/29/2023)   SDOH  Interventions: Food Insecurity Interventions: Intervention Not Indicated, Inpatient TOC Housing Interventions: Intervention Not Indicated, Inpatient TOC Transportation Interventions: Intervention Not Indicated, Inpatient TOC Utilities Interventions: Intervention Not Indicated, Inpatient TOC   Readmission Risk Interventions    07/28/2022    2:24 PM  Readmission Risk Prevention Plan  Transportation Screening Complete  PCP or Specialist Appt within 5-7 Days Complete  Home Care Screening Complete  Medication Review (RN CM) Complete

## 2023-12-30 NOTE — Assessment & Plan Note (Signed)
 Continue heparin  drip as able May stop if signs of active bleeding.

## 2023-12-30 NOTE — Assessment & Plan Note (Signed)
 Continue Synthroid 

## 2023-12-30 NOTE — Consult Note (Signed)
 [facility] Hematology and oncology consult note   Patient Care Team: Henry Ingle, MD as PCP - General (Internal Medicine) Hobart Powell BRAVO, MD (Inactive) as PCP - Cardiology (Cardiology) Cindie Ole DASEN, MD as PCP - Electrophysiology (Cardiology) Nori Sari SQUIBB, RN as Oncology Nurse Navigator   ASSESSMENT & PLAN:  78 y.o.male with past medical history of metastatic gastric adenocarcinoma, paroxysmal atrial fibrillation, hypothyroidism, recurrent pulmonary embolism, heart failure, GERD, anemia of chronic disease consulted for recurrent PE.  Clinical presentation consistent with new onset PE.  Patient was on Lovenox  until having blood-tinged sputum on Thursday.  Suspect that he likely failed low-dose prophylactic anticoagulation.  Discussed moving forward surgery will be challenging balancing bleeding and clotting.  He is currently on heparin  drip.  He is not having active bleeding.  Recommend continue today.  We discussed that treatment of his metastatic gastric cancer is considered palliative and not curative.  He understands.  If he continue on full dose anticoagulation, he may expect risks of GI bleeding as he had in the past.  If he continue on prophylactic dose of Lovenox , he may experience worsening symptoms related to PE and thrombosis.  Either way life-threatening event can occur.  He may also experience recurrent emergency room and hospitalization.  We touch base on palliative care consult, advance care planning.  The difference between this and hospice.  He is open to talk to them along with his family at bedside today.  I will let Dr. Cloretta know tomorrow to follow-up.  Assessment & Plan Acute pulmonary embolism without acute cor pulmonale (HCC) Continue heparin  drip as able May stop if signs of active bleeding. Paroxysmal atrial fibrillation (HCC) Currently rate controlled and not a concern. Patient has been on chronic anticoagulation Cough Secondary  to acute PE Thrombocytopenia (HCC) Stable, expected with chemotherapy Gastric cancer (HCC) Several systemic treatment in the past. Started FOLFOX plus zolbetuximab 11/14/2023 now completed 4 cycles. Will continue follow-up with Dr. Cloretta Anemia of chronic disease Stable, monitor daily  Goals of care Will discuss with palliative care.  All questions were answered.     Pauletta JAYSON Chihuahua, MD 12/30/2023 12:00 PM   CHIEF COMPLAINTS/PURPOSE OF ADMISSION Gastric cancer with recurrent thrombosis.  HISTORY OF PRESENTING ILLNESS:  HAN VEJAR 78 y.o. male consulted for recurrent PE with gastric cancer.   Reported history of metastatic gastric cancer, currently on active treatment with FOLFOX and zolbetuximab, history of PAF, pulmonary embolism, chronic heart failure, anemia of chronic disease, recently with 2 to 3 days of new productive cough and thick production.  Report of blood-tinged sputum.  Reported history of PE in March 2024, completed a course of anticoagulation and converted to Lovenox  40 mg daily after recover from history of acute GI bleed in April 2025.  Patient presented to [facility].  Initial oxygen reported above 95%.  CTA showed bilateral lower lobe pulmonary emboli.  No evidence of right heart strain.  As far as PE records show patient was admitted in March 2024 for bilateral PE and right heart strain and right lower extremity DVT.  Patient received heparin  anticoagulation.  Patient with Lovenox  daily dosing in January 2025.  Patient presented with hematemesis and GI bleed in April 2025.  After resolution of GI bleed, Lovenox  was changed to 40 mg daily in April 2025.  He is now presenting with bilateral PE.  Last chemotherapy date 8/21.  Hemoglobin was 11.2 on 8/20.  On 8/23 on admission hemoglobin was 10.5.  Since admission, patient  has been placed on heparin  drip.  Overall he is feeling about the same.  Some pleuritic chest pain on the right middle back.  A few episodes of  blood-tinged sputum this morning.  No melena or hematochezia or other bleeding.  Summary of oncologic history as follows: Oncology History  Malignant neoplasm of stomach (HCC)  04/26/2022 Initial Diagnosis   Malignant neoplasm of overlapping sites of stomach (HCC)   05/04/2022 - 09/22/2022 Chemotherapy   Patient is on Treatment Plan : GASTROESOPHAGEAL FOLFOX q14d x 6 cycles     11/14/2023 -  Chemotherapy   Patient is on Treatment Plan : GASTROESOPHAGEAL Zolbetuximab (800/400) + FOLFOX D1,15,29 q42d x 4 cycles / Zolbetuximab (400) + 5FU + Leucovorin  D1,15,29 q42d       MEDICAL HISTORY:  Past Medical History:  Diagnosis Date   A-fib (HCC)    Asthma    Cataract    Gastric cancer (HCC)    GERD (gastroesophageal reflux disease)    Lichen planus    Methotrexate, long term, current use    no longer taking   Psoriasis    Sleep apnea    Thyroid  disease    Urticaria     SURGICAL HISTORY: Past Surgical History:  Procedure Laterality Date   CARDIOVERSION N/A 02/23/2020   Procedure: CARDIOVERSION;  Surgeon: Santo Stanly LABOR, MD;  Location: [facility] ENDOSCOPY;  Service: Cardiovascular;  Laterality: N/A;   CARDIOVERSION N/A 04/12/2020   Procedure: CARDIOVERSION;  Surgeon: Lonni Slain, MD;  Location: [facility] ENDOSCOPY;  Service: Cardiovascular;  Laterality: N/A;   CATARACT EXTRACTION Left    2019   ESOPHAGOGASTRODUODENOSCOPY N/A 08/18/2023   Procedure: EGD (ESOPHAGOGASTRODUODENOSCOPY);  Surgeon: Legrand Victory LITTIE DOUGLAS, MD;  Location: [facility] ENDOSCOPY;  Service: Gastroenterology;  Laterality: N/A;   IR IMAGING GUIDED PORT INSERTION  05/03/2022   TONSILLECTOMY      SOCIAL HISTORY: Social History   Socioeconomic History   Marital status: Married    Spouse name: Not on file   Number of children: Not on file   Years of education: Not on file   Highest education level: Not on file  Occupational History   Not on file  Tobacco Use   Smoking status: Former    Current packs/day: 0.00     Types: Cigarettes    Quit date: 77    Years since quitting: 43.6   Smokeless tobacco: Never  Vaping Use   Vaping status: Not on file  Substance and Sexual Activity   Alcohol  use: Yes    Alcohol /week: 1.0 standard drink of alcohol     Types: 1 Glasses of wine per week    Comment: occasionally   Drug use: No   Sexual activity: Not on file  Other Topics Concern   Not on file  Social History Narrative   Married with 1 son and 1 daughter   Former smoker no alcohol  no caffeine no tobacco or drug use   Social Drivers of Corporate investment banker Strain: Low Risk  (04/17/2022)   Overall Financial Resource Strain (CARDIA)    Difficulty of Paying Living Expenses: Not hard at all  Food Insecurity: No Food Insecurity (12/29/2023)   Hunger Vital Sign    Worried About Running Out of Food in the Last Year: Never true    Ran Out of Food in the Last Year: Never true  Transportation Needs: No Transportation Needs (12/29/2023)   PRAPARE - Administrator, Civil Service (Medical): No    Lack of Transportation (Non-Medical):  No  Physical Activity: Not on file  Stress: No Stress Concern Present (04/17/2022)   [employer] of Occupational Health - Occupational Stress Questionnaire    Feeling of Stress : Not at all  Social Connections: Moderately Isolated (12/29/2023)   Social Connection and Isolation Panel    Frequency of Communication with Friends and Family: More than three times a week    Frequency of Social Gatherings with Friends and Family: Once a week    Attends Religious Services: Never    Database administrator or Organizations: No    Attends Banker Meetings: Never    Marital Status: Married  Catering manager Violence: Not At Risk (12/29/2023)   Humiliation, Afraid, Rape, and Kick questionnaire    Fear of Current or Ex-Partner: No    Emotionally Abused: No    Physically Abused: No    Sexually Abused: No    FAMILY HISTORY: Family History  Problem  Relation Age of Onset   Diabetes Mother    Stroke Mother    Leukemia Maternal Great-grandmother    Allergic rhinitis Neg Hx    Asthma Neg Hx    Eczema Neg Hx    Urticaria Neg Hx    Colon cancer Neg Hx    Esophageal cancer Neg Hx    Stomach cancer Neg Hx     ALLERGIES:  is allergic to gold, mixed grasses, and hydroxyquinolines.  MEDICATIONS:  Current Facility-Administered Medications  Medication Dose Route Frequency Provider Last Rate Last Admin   acetaminophen  (TYLENOL ) tablet 650 mg  650 mg Oral Q6H PRN Howerter, Justin B, DO   650 mg at 12/29/23 2329   Or   acetaminophen  (TYLENOL ) suppository 650 mg  650 mg Rectal Q6H PRN Howerter, Justin B, DO       azithromycin  (ZITHROMAX ) 500 mg in sodium chloride  0.9 % 250 mL IVPB  500 mg Intravenous Q24H Howerter, Justin B, DO 250 mL/hr at 12/30/23 0058 500 mg at 12/30/23 0058   benzonatate  (TESSALON ) capsule 200 mg  200 mg Oral TID PRN Howerter, Justin B, DO       cefTRIAXone  (ROCEPHIN ) 1 g in sodium chloride  0.9 % 100 mL IVPB  1 g Intravenous Q24H Howerter, Justin B, DO 200 mL/hr at 12/29/23 2333 1 g at 12/29/23 2333   Chlorhexidine  Gluconate Cloth 2 % PADS 6 each  6 each Topical Daily Howerter, Justin B, DO       clobetasol  ointment (TEMOVATE ) 0.05 %   Topical BID PRN Shalhoub, George J, MD       feeding supplement (ENSURE PLUS HIGH PROTEIN) liquid 237 mL  237 mL Oral BID BM Shalhoub, George J, MD       guaiFENesin  (MUCINEX ) 12 hr tablet 600 mg  600 mg Oral BID Howerter, Justin B, DO   600 mg at 12/30/23 1012   heparin  ADULT infusion 100 units/mL (25000 units/250mL)  1,600 Units/hr Intravenous Continuous Utomwen, Adesuwa, RPH 16 mL/hr at 12/30/23 1134 1,600 Units/hr at 12/30/23 1134   hydrOXYzine  (ATARAX ) tablet 25 mg  25 mg Oral TID PRN Shalhoub, George J, MD   25 mg at 12/30/23 1012   insulin  aspart (novoLOG ) injection 0-9 Units  0-9 Units Subcutaneous Q6H Utomwen, Adesuwa, RPH       levalbuterol  (XOPENEX ) nebulizer solution 1.25 mg  1.25  mg Nebulization Q4H PRN Howerter, Justin B, DO       levothyroxine  (SYNTHROID ) tablet 112 mcg  112 mcg Oral Q0600 Howerter, Justin B, DO   112 mcg  at 12/30/23 0445   melatonin tablet 3 mg  3 mg Oral QHS PRN Howerter, Justin B, DO   3 mg at 12/29/23 2329   menthol -cetylpyridinium (CEPACOL) lozenge 3 mg  1 lozenge Oral PRN Howerter, Justin B, DO       ondansetron  (ZOFRAN ) injection 4 mg  4 mg Intravenous Q6H PRN Howerter, Justin B, DO       Oral care mouth rinse  15 mL Mouth Rinse PRN Shalhoub, Zachary PARAS, MD       pantoprazole  (PROTONIX ) injection 40 mg  40 mg Intravenous Q12H Howerter, Justin B, DO   40 mg at 12/30/23 1013   potassium PHOSPHATE  15 mmol in dextrose  5 % 250 mL infusion  15 mmol Intravenous Once Utomwen, Adesuwa, RPH 43 mL/hr at 12/30/23 1027 15 mmol at 12/30/23 1027   sodium chloride  flush (NS) 0.9 % injection 10-40 mL  10-40 mL Intracatheter PRN Howerter, Justin B, DO       TPN ADULT (ION)   Intravenous Continuous TPN Utomwen, Adesuwa, RPH        REVIEW OF SYSTEMS:    All other systems were reviewed with the patient and are negative.  PHYSICAL EXAMINATION: ECOG PERFORMANCE STATUS: 1 - Symptomatic but completely ambulatory  Vitals:   12/30/23 0522 12/30/23 0824  BP: (!) 96/59 102/62  Pulse: 70 71  Resp: 18 20  Temp: 98.3 F (36.8 C) 98.2 F (36.8 C)  SpO2: 96% 96%   Filed Weights   12/29/23 1513  Weight: 200 lb (90.7 kg)    GENERAL:alert, no distress and comfortable SKIN: Mediport in place.  Few bruising on extremity EYES:  sclera clear OROPHARYNX: no exudate, moist NECK: supple. No mass LYMPH:  no palpable cervical lymphadenopathy LUNGS: clear to auscultation and normal breathing effort.  No wheeze or rales HEART: regular rate & rhythm and no murmurs ABDOMEN:abdomen soft, non-tender and normal bowel sounds Musculoskeletal: bilateral lower extremity edema NEURO: alert with fluent speech   LABORATORY DATA:  I have reviewed the data as listed Lab Results   Component Value Date   WBC 4.3 12/30/2023   HGB 8.8 (L) 12/30/2023   HCT 27.8 (L) 12/30/2023   MCV 92.9 12/30/2023   PLT 100 (L) 12/30/2023   Recent Labs    12/26/23 1328 12/29/23 1544 12/30/23 0152  NA 140 137 138  K 3.9 3.8 3.7  CL 104 104 105  CO2 24 22 23   GLUCOSE 112* 110* 93  BUN 22 23 19   CREATININE 0.77 0.83 0.69  CALCIUM  8.9 8.3* 7.8*  GFRNONAA >60 >60 >60  PROT 5.6* 5.3* 4.8*  ALBUMIN 3.1* 2.8* 1.8*  AST 71* 20 12*  ALT 61* 28 20  ALKPHOS 701* 388* 252*  BILITOT 0.6 0.6 0.7    RADIOGRAPHIC STUDIES: I have personally reviewed the radiological images as listed and agreed with the findings in the report. ECHOCARDIOGRAM COMPLETE Result Date: 12/30/2023    ECHOCARDIOGRAM REPORT   Patient Name:   SHENOUDA GENOVA Date of Exam: 12/30/2023 Medical Rec #:  [id number]       Height:       71.0 in Accession #:    [id number]      Weight:       200.0 lb Date of Birth:  [birth year]      BSA:          2.109 m Patient Age:    77 years        BP:  96/59 mmHg Patient Gender: M               HR:           58 bpm. Exam Location:  Inpatient Procedure: 2D Echo, Cardiac Doppler and Color Doppler (Both Spectral and Color            Flow Doppler were utilized during procedure). Indications:    CHF  History:        Patient has prior history of Echocardiogram examinations, most                 recent 07/25/2022. Arrythmias:Atrial Fibrillation; Risk                 Factors:Sleep Apnea.  Sonographer:    Jayson Gaskins Referring Phys: [id number] JUSTIN B HOWERTER IMPRESSIONS  1. Left ventricular ejection fraction, by estimation, is 55 to 60%. The left ventricle has normal function. The left ventricle has no regional wall motion abnormalities. There is moderate left ventricular hypertrophy of the basal-septal segment. Left ventricular diastolic parameters are consistent with Grade I diastolic dysfunction (impaired relaxation).  2. Right ventricular systolic function is normal. The right ventricular size  is normal.  3. Left atrial size was severely dilated.  4. Right atrial size was severely dilated.  5. The mitral valve is normal in structure. Trivial mitral valve regurgitation.  6. The aortic valve is tricuspid. Aortic valve regurgitation is not visualized. Moderate aortic valve stenosis. Aortic valve area, by VTI measures 1.01 cm. Aortic valve mean gradient measures 28.0 mmHg. Aortic valve Vmax measures 3.47 m/s. Peak gradient 48.2 mmHg, DI 0.32.  7. Aortic dilatation noted. There is mild dilatation of the aortic root, measuring 40 mm. There is mild dilatation of the ascending aorta, measuring 41 mm. Comparison(s): Changes from prior study are noted. 07/25/2022: LVEF 55-60%, mild AS, MG 15 mmHg, dilated aorta to 40 mm. FINDINGS  Left Ventricle: Left ventricular ejection fraction, by estimation, is 55 to 60%. The left ventricle has normal function. The left ventricle has no regional wall motion abnormalities. The left ventricular internal cavity size was normal in size. There is  moderate left ventricular hypertrophy of the basal-septal segment. Left ventricular diastolic parameters are consistent with Grade I diastolic dysfunction (impaired relaxation). Indeterminate filling pressures. Right Ventricle: The right ventricular size is normal. No increase in right ventricular wall thickness. Right ventricular systolic function is normal. Left Atrium: Left atrial size was severely dilated. Right Atrium: Right atrial size was severely dilated. Pericardium: There is no evidence of pericardial effusion. Mitral Valve: The mitral valve is normal in structure. Trivial mitral valve regurgitation. Tricuspid Valve: The tricuspid valve is grossly normal. Tricuspid valve regurgitation is mild. Aortic Valve: The aortic valve is tricuspid. Aortic valve regurgitation is not visualized. Moderate aortic stenosis is present. Aortic valve mean gradient measures 28.0 mmHg. Aortic valve peak gradient measures 48.2 mmHg. Aortic valve  area, by VTI measures 1.01 cm. Pulmonic Valve: The pulmonic valve was grossly normal. Pulmonic valve regurgitation is trivial. Aorta: Aortic dilatation noted. There is mild dilatation of the aortic root, measuring 40 mm. There is mild dilatation of the ascending aorta, measuring 41 mm. Venous: The inferior vena cava was not well visualized. IAS/Shunts: No atrial level shunt detected by color flow Doppler.  LEFT VENTRICLE PLAX 2D LVIDd:         5.00 cm   Diastology LVIDs:         3.50 cm   LV e' medial:    6.96  cm/s LV PW:         1.30 cm   LV E/e' medial:  12.9 LV IVS:        1.10 cm   LV e' lateral:   11.20 cm/s LVOT diam:     2.00 cm   LV E/e' lateral: 8.0 LV SV:         83 LV SV Index:   39 LVOT Area:     3.14 cm  RIGHT VENTRICLE RV S prime:     18.90 cm/s TAPSE (M-mode): 2.9 cm LEFT ATRIUM              Index        RIGHT ATRIUM           Index LA Vol (A2C):   92.1 ml  43.68 ml/m  RA Area:     31.80 cm LA Vol (A4C):   106.0 ml 50.27 ml/m  RA Volume:   125.00 ml 59.28 ml/m LA Biplane Vol: 99.1 ml  47.00 ml/m  AORTIC VALVE AV Area (Vmax):    0.94 cm AV Area (Vmean):   0.92 cm AV Area (VTI):     1.01 cm AV Vmax:           347.00 cm/s AV Vmean:          252.000 cm/s AV VTI:            0.822 m AV Peak Grad:      48.2 mmHg AV Mean Grad:      28.0 mmHg LVOT Vmax:         104.00 cm/s LVOT Vmean:        74.100 cm/s LVOT VTI:          0.265 m LVOT/AV VTI ratio: 0.32  AORTA Ao Root diam: 4.00 cm MITRAL VALVE               TRICUSPID VALVE MV Area (PHT): 3.15 cm    TR Peak grad:   21.7 mmHg MV Decel Time: 241 msec    TR Vmax:        233.00 cm/s MV E velocity: 90.00 cm/s MV A velocity: 83.10 cm/s  SHUNTS MV E/A ratio:  1.08        Systemic VTI:  0.26 m                            Systemic Diam: 2.00 cm Vinie Maxcy MD Electronically signed by Vinie Maxcy MD Signature Date/Time: 12/30/2023/10:14:51 AM    Final    CT Angio Chest PE W and/or Wo Contrast Result Date: 12/29/2023 CLINICAL DATA:  Concern for embolism.  EXAM: CT ANGIOGRAPHY CHEST WITH CONTRAST TECHNIQUE: Multidetector CT imaging of the chest was performed using the standard protocol during bolus administration of intravenous contrast. Multiplanar CT image reconstructions and MIPs were obtained to evaluate the vascular anatomy. RADIATION DOSE REDUCTION: This exam was performed according to the departmental dose-optimization program which includes automated exposure control, adjustment of the mA and/or kV according to patient size and/or use of iterative reconstruction technique. CONTRAST:  80mL OMNIPAQUE  IOHEXOL  350 MG/ML SOLN COMPARISON:  Chest CT dated 10/31/2023. FINDINGS: Cardiovascular: There is no cardiomegaly or pericardial effusion. There is coronary vascular calcification. Mild atherosclerotic calcification of the thoracic aorta. No aneurysmal dilatation or dissection. Bilateral lower lobe pulmonary artery emboli involving the lobar and segmental branches. No evidence of right heart straining. Right-sided Port-A-Cath with tip at the cavoatrial junction.  Mediastinum/Nodes: Mildly enlarged right hilar lymph node measures 13 mm short axis. No mediastinal adenopathy. The esophagus is grossly unremarkable. No mediastinal fluid collection. Lungs/Pleura: Trace left pleural effusion. There are bibasilar subpleural atelectasis. Pneumonia is not excluded. There is no pneumothorax. The central airways are patent. Upper Abdomen: Better evaluated on CT of 10/31/2023. Musculoskeletal: Osteopenia with degenerative changes spine. Similar appearance of T8 compression fracture. No new fracture. Review of the MIP images confirms the above findings. IMPRESSION: 1. Bilateral lower lobe pulmonary artery emboli. No evidence of right heart straining. 2. Trace left pleural effusion and bibasilar subpleural atelectasis. Pneumonia is not excluded. 3.  Aortic Atherosclerosis (ICD10-I70.0). These results were called by telephone at the time of interpretation on 12/29/2023 at 4:55 pm to  provider [facility] , who verbally acknowledged these results. Electronically Signed   By: Vanetta Chou M.D.   On: 12/29/2023 17:00

## 2023-12-30 NOTE — Assessment & Plan Note (Signed)
 Gastric malignancy with gastric mass last seen via 08/2023 EGD  Follows with Dr. Cloretta Currently receiving chemotherapy with FOLFOX Presence epigastric mass that has bled before proves problematic in the setting of full dose anticoagulation

## 2023-12-31 DIAGNOSIS — R042 Hemoptysis: Secondary | ICD-10-CM | POA: Diagnosis not present

## 2023-12-31 DIAGNOSIS — Z515 Encounter for palliative care: Secondary | ICD-10-CM | POA: Diagnosis not present

## 2023-12-31 DIAGNOSIS — I2699 Other pulmonary embolism without acute cor pulmonale: Secondary | ICD-10-CM | POA: Diagnosis not present

## 2023-12-31 DIAGNOSIS — C169 Malignant neoplasm of stomach, unspecified: Secondary | ICD-10-CM | POA: Diagnosis not present

## 2023-12-31 DIAGNOSIS — Z7189 Other specified counseling: Secondary | ICD-10-CM | POA: Diagnosis not present

## 2023-12-31 LAB — COMPREHENSIVE METABOLIC PANEL WITH GFR
ALT: 16 U/L (ref 0–44)
AST: 12 U/L — ABNORMAL LOW (ref 15–41)
Albumin: 2 g/dL — ABNORMAL LOW (ref 3.5–5.0)
Alkaline Phosphatase: 210 U/L — ABNORMAL HIGH (ref 38–126)
Anion gap: 8 (ref 5–15)
BUN: 11 mg/dL (ref 8–23)
CO2: 22 mmol/L (ref 22–32)
Calcium: 8 mg/dL — ABNORMAL LOW (ref 8.9–10.3)
Chloride: 105 mmol/L (ref 98–111)
Creatinine, Ser: 0.58 mg/dL — ABNORMAL LOW (ref 0.61–1.24)
GFR, Estimated: >60 mL/min (ref >60)
Glucose, Bld: 98 mg/dL (ref 70–99)
Potassium: 3.6 mmol/L (ref 3.5–5.1)
Sodium: 135 mmol/L (ref 135–145)
Total Bilirubin: 0.7 mg/dL (ref 0.0–1.2)
Total Protein: 5 g/dL — ABNORMAL LOW (ref 6.5–8.1)

## 2023-12-31 LAB — TYPE AND SCREEN
ABO/RH(D): O POS
ABO/RH(D): O POS
Antibody Screen: NEGATIVE
Antibody Screen: NEGATIVE

## 2023-12-31 LAB — TRIGLYCERIDES: Triglycerides: 113 mg/dL (ref <150)

## 2023-12-31 LAB — CBC WITH DIFFERENTIAL/PLATELET
Abs Immature Granulocytes: 0.02 K/uL (ref 0.00–0.07)
Basophils Absolute: 0 K/uL (ref 0.0–0.1)
Basophils Relative: 1 %
Eosinophils Absolute: 0.3 K/uL (ref 0.0–0.5)
Eosinophils Relative: 11 %
HCT: 31 % — ABNORMAL LOW (ref 39.0–52.0)
Hemoglobin: 9.5 g/dL — ABNORMAL LOW (ref 13.0–17.0)
Immature Granulocytes: 1 %
Lymphocytes Relative: 20 %
Lymphs Abs: 0.6 K/uL — ABNORMAL LOW (ref 0.7–4.0)
MCH: 29 pg (ref 26.0–34.0)
MCHC: 30.6 g/dL (ref 30.0–36.0)
MCV: 94.5 fL (ref 80.0–100.0)
Monocytes Absolute: 0.1 K/uL (ref 0.1–1.0)
Monocytes Relative: 3 %
Neutro Abs: 2.1 K/uL (ref 1.7–7.7)
Neutrophils Relative %: 64 %
Platelets: 116 K/uL — ABNORMAL LOW (ref 150–400)
RBC: 3.28 MIL/uL — ABNORMAL LOW (ref 4.22–5.81)
RDW: 19.9 % — ABNORMAL HIGH (ref 11.5–15.5)
WBC: 3.2 K/uL — ABNORMAL LOW (ref 4.0–10.5)
nRBC: 0 % (ref 0.0–0.2)

## 2023-12-31 LAB — GLUCOSE, CAPILLARY
Glucose-Capillary: 82 mg/dL (ref 70–99)
Glucose-Capillary: 90 mg/dL (ref 70–99)
Glucose-Capillary: 95 mg/dL (ref 70–99)
Glucose-Capillary: 97 mg/dL (ref 70–99)

## 2023-12-31 LAB — FERRITIN: Ferritin: 171 ng/mL (ref 24–336)

## 2023-12-31 LAB — IRON AND TIBC
Iron: 111 ug/dL (ref 45–182)
Saturation Ratios: 47 % — ABNORMAL HIGH (ref 17.9–39.5)
TIBC: 237 ug/dL — ABNORMAL LOW (ref 250–450)
UIBC: 126 ug/dL

## 2023-12-31 LAB — VITAMIN B12: Vitamin B-12: 368 pg/mL (ref 180–914)

## 2023-12-31 LAB — FOLATE: Folate: 39.3 ng/mL (ref >5.9)

## 2023-12-31 LAB — PHOSPHORUS: Phosphorus: 1.9 mg/dL — ABNORMAL LOW (ref 2.5–4.6)

## 2023-12-31 LAB — HEPARIN LEVEL (UNFRACTIONATED): Heparin Unfractionated: 0.4 [IU]/mL (ref 0.30–0.70)

## 2023-12-31 LAB — MAGNESIUM: Magnesium: 1.9 mg/dL (ref 1.7–2.4)

## 2023-12-31 MED ORDER — HYDROXYZINE HCL 25 MG PO TABS
25.0000 mg | ORAL_TABLET | ORAL | Status: DC | PRN
  Administered 2023-12-31 – 2024-01-04 (×14): 25 mg via ORAL
  Filled 2023-12-31 (×14): qty 1

## 2023-12-31 MED ORDER — K PHOS MONO-SOD PHOS DI & MONO 155-852-130 MG PO TABS
500.0000 mg | ORAL_TABLET | ORAL | Status: AC
  Administered 2023-12-31 (×4): 500 mg via ORAL
  Filled 2023-12-31 (×4): qty 2

## 2023-12-31 MED ORDER — POTASSIUM & SODIUM PHOSPHATES 280-160-250 MG PO PACK
2.0000 | PACK | ORAL | Status: DC
Start: 2023-12-31 — End: 2023-12-31
  Filled 2023-12-31 (×2): qty 2

## 2023-12-31 MED ORDER — POTASSIUM PHOSPHATES 15 MMOLE/5ML IV SOLN
30.0000 mmol | Freq: Once | INTRAVENOUS | Status: DC
  Filled 2023-12-31: qty 10

## 2023-12-31 MED ORDER — DEXAMETHASONE 4 MG PO TABS
6.0000 mg | ORAL_TABLET | Freq: Every day | ORAL | Status: AC
  Administered 2023-12-31 – 2024-01-01 (×2): 6 mg via ORAL
  Filled 2023-12-31 (×2): qty 1

## 2023-12-31 MED ORDER — HYDROXYZINE HCL 25 MG PO TABS
50.0000 mg | ORAL_TABLET | Freq: Three times a day (TID) | ORAL | Status: DC | PRN
Start: 2023-12-31 — End: 2023-12-31

## 2023-12-31 MED ORDER — TRAZODONE HCL 50 MG PO TABS
50.0000 mg | ORAL_TABLET | Freq: Every evening | ORAL | Status: DC | PRN
  Administered 2023-12-31 – 2024-01-03 (×4): 50 mg via ORAL
  Filled 2023-12-31 (×4): qty 1

## 2023-12-31 MED ORDER — AQUAPHOR EX OINT
TOPICAL_OINTMENT | Freq: Two times a day (BID) | CUTANEOUS | Status: DC
  Filled 2023-12-31: qty 50

## 2023-12-31 MED ORDER — TRAVASOL 10 % IV SOLN
INTRAVENOUS | Status: AC
  Filled 2023-12-31: qty 792

## 2023-12-31 NOTE — Progress Notes (Addendum)
 IP PROGRESS NOTE  Subjective:   Todd Harrison is well-known to me with a history of metastatic gastric cancer.  He presented to the emergency room 12/29/2023 with hemoptysis and pleuritic right-sided chest pain.  A CT chest revealed bilateral pulmonary embolism.  He is now on heparin  after being maintained on prophylactic dose Lovenox  at home. Todd Harrison completed another cycle of FOLFOX/zolbetuximab beginning 12/27/2023.  No nausea/vomiting or neuropathy symptoms.  He complains of a pruritic rash over the trunk and extremities for the past several days.  No new medications.  Objective: Vital signs in last 24 hours: Blood pressure (!) 91/56, pulse 66, temperature 98.4 F (36.9 C), temperature source Oral, resp. rate 15, height 5' 11 (1.803 m), weight 200 lb (90.7 kg), SpO2 95%.  Intake/Output from previous day: 08/24 0701 - 08/25 0700 In: 1525.3 [P.O.:50; I.V.:848.2; IV Piggyback:627.1] Out: 1100 [Urine:1100]  Physical Exam:  HEENT: No thrush or ulcers Lungs: Decreased breath sounds at the lower posterior chest in the left greater than right, no respiratory distress Cardiac: The rate and rhythm Abdomen: No hepatosplenomegaly, nondistended Extremities: No leg edema Skin: Erythematous rash over the trunk and arms  Portacath/PICC-without erythema  Lab Results: Recent Labs    12/29/23 1544 12/29/23 2055 12/30/23 0152 12/30/23 0940  WBC 5.2  --  4.3  --   HGB 10.5*   < > 8.9*  8.8* 8.8*  HCT 32.5*   < > 28.8*  29.2* 27.8*  PLT 107*  --  100*  --    < > = values in this interval not displayed.    BMET Recent Labs    12/29/23 1544 12/30/23 0152  NA 137 138  K 3.8 3.7  CL 104 105  CO2 22 23  GLUCOSE 110* 93  BUN 23 19  CREATININE 0.83 0.69  CALCIUM  8.3* 7.8*    Lab Results  Component Value Date   CEA1 <0.6 04/13/2022   CEA 2.32 11/14/2023    Studies/Results: ECHOCARDIOGRAM COMPLETE Result Date: 12/30/2023    ECHOCARDIOGRAM REPORT   Patient Name:   Todd Harrison  Date of Exam: 12/30/2023 Medical Rec #:  [id number]       Height:       71.0 in Accession #:    [id number]      Weight:       200.0 lb Date of Birth:  1945/08/09      BSA:          2.109 m Patient Age:    77 years        BP:           96/59 mmHg Patient Gender: M               HR:           58 bpm. Exam Location:  Inpatient Procedure: 2D Echo, Cardiac Doppler and Color Doppler (Both Spectral and Color            Flow Doppler were utilized during procedure). Indications:    CHF  History:        Patient has prior history of Echocardiogram examinations, most                 recent 07/25/2022. Arrythmias:Atrial Fibrillation; Risk                 Factors:Sleep Apnea.  Sonographer:    Jayson Gaskins Referring Phys: [id number] JUSTIN B HOWERTER IMPRESSIONS  1. Left ventricular ejection fraction, by estimation, is 55 to  60%. The left ventricle has normal function. The left ventricle has no regional wall motion abnormalities. There is moderate left ventricular hypertrophy of the basal-septal segment. Left ventricular diastolic parameters are consistent with Grade I diastolic dysfunction (impaired relaxation).  2. Right ventricular systolic function is normal. The right ventricular size is normal.  3. Left atrial size was severely dilated.  4. Right atrial size was severely dilated.  5. The mitral valve is normal in structure. Trivial mitral valve regurgitation.  6. The aortic valve is tricuspid. Aortic valve regurgitation is not visualized. Moderate aortic valve stenosis. Aortic valve area, by VTI measures 1.01 cm. Aortic valve mean gradient measures 28.0 mmHg. Aortic valve Vmax measures 3.47 m/s. Peak gradient 48.2 mmHg, DI 0.32.  7. Aortic dilatation noted. There is mild dilatation of the aortic root, measuring 40 mm. There is mild dilatation of the ascending aorta, measuring 41 mm. Comparison(s): Changes from prior study are noted. 07/25/2022: LVEF 55-60%, mild AS, MG 15 mmHg, dilated aorta to 40 mm. FINDINGS  Left Ventricle:  Left ventricular ejection fraction, by estimation, is 55 to 60%. The left ventricle has normal function. The left ventricle has no regional wall motion abnormalities. The left ventricular internal cavity size was normal in size. There is  moderate left ventricular hypertrophy of the basal-septal segment. Left ventricular diastolic parameters are consistent with Grade I diastolic dysfunction (impaired relaxation). Indeterminate filling pressures. Right Ventricle: The right ventricular size is normal. No increase in right ventricular wall thickness. Right ventricular systolic function is normal. Left Atrium: Left atrial size was severely dilated. Right Atrium: Right atrial size was severely dilated. Pericardium: There is no evidence of pericardial effusion. Mitral Valve: The mitral valve is normal in structure. Trivial mitral valve regurgitation. Tricuspid Valve: The tricuspid valve is grossly normal. Tricuspid valve regurgitation is mild. Aortic Valve: The aortic valve is tricuspid. Aortic valve regurgitation is not visualized. Moderate aortic stenosis is present. Aortic valve mean gradient measures 28.0 mmHg. Aortic valve peak gradient measures 48.2 mmHg. Aortic valve area, by VTI measures 1.01 cm. Pulmonic Valve: The pulmonic valve was grossly normal. Pulmonic valve regurgitation is trivial. Aorta: Aortic dilatation noted. There is mild dilatation of the aortic root, measuring 40 mm. There is mild dilatation of the ascending aorta, measuring 41 mm. Venous: The inferior vena cava was not well visualized. IAS/Shunts: No atrial level shunt detected by color flow Doppler.  LEFT VENTRICLE PLAX 2D LVIDd:         5.00 cm   Diastology LVIDs:         3.50 cm   LV e' medial:    6.96 cm/s LV PW:         1.30 cm   LV E/e' medial:  12.9 LV IVS:        1.10 cm   LV e' lateral:   11.20 cm/s LVOT diam:     2.00 cm   LV E/e' lateral: 8.0 LV SV:         83 LV SV Index:   39 LVOT Area:     3.14 cm  RIGHT VENTRICLE RV S prime:      18.90 cm/s TAPSE (M-mode): 2.9 cm LEFT ATRIUM              Index        RIGHT ATRIUM           Index LA Vol (A2C):   92.1 ml  43.68 ml/m  RA Area:     31.80 cm LA  Vol (A4C):   106.0 ml 50.27 ml/m  RA Volume:   125.00 ml 59.28 ml/m LA Biplane Vol: 99.1 ml  47.00 ml/m  AORTIC VALVE AV Area (Vmax):    0.94 cm AV Area (Vmean):   0.92 cm AV Area (VTI):     1.01 cm AV Vmax:           347.00 cm/s AV Vmean:          252.000 cm/s AV VTI:            0.822 m AV Peak Grad:      48.2 mmHg AV Mean Grad:      28.0 mmHg LVOT Vmax:         104.00 cm/s LVOT Vmean:        74.100 cm/s LVOT VTI:          0.265 m LVOT/AV VTI ratio: 0.32  AORTA Ao Root diam: 4.00 cm MITRAL VALVE               TRICUSPID VALVE MV Area (PHT): 3.15 cm    TR Peak grad:   21.7 mmHg MV Decel Time: 241 msec    TR Vmax:        233.00 cm/s MV E velocity: 90.00 cm/s MV A velocity: 83.10 cm/s  SHUNTS MV E/A ratio:  1.08        Systemic VTI:  0.26 m                            Systemic Diam: 2.00 cm Vinie Maxcy MD Electronically signed by Vinie Maxcy MD Signature Date/Time: 12/30/2023/10:14:51 AM    Final    CT Angio Chest PE W and/or Wo Contrast Result Date: 12/29/2023 CLINICAL DATA:  Concern for embolism. EXAM: CT ANGIOGRAPHY CHEST WITH CONTRAST TECHNIQUE: Multidetector CT imaging of the chest was performed using the standard protocol during bolus administration of intravenous contrast. Multiplanar CT image reconstructions and MIPs were obtained to evaluate the vascular anatomy. RADIATION DOSE REDUCTION: This exam was performed according to the departmental dose-optimization program which includes automated exposure control, adjustment of the mA and/or kV according to patient size and/or use of iterative reconstruction technique. CONTRAST:  80mL OMNIPAQUE  IOHEXOL  350 MG/ML SOLN COMPARISON:  Chest CT dated 10/31/2023. FINDINGS: Cardiovascular: There is no cardiomegaly or pericardial effusion. There is coronary vascular calcification. Mild  atherosclerotic calcification of the thoracic aorta. No aneurysmal dilatation or dissection. Bilateral lower lobe pulmonary artery emboli involving the lobar and segmental branches. No evidence of right heart straining. Right-sided Port-A-Cath with tip at the cavoatrial junction. Mediastinum/Nodes: Mildly enlarged right hilar lymph node measures 13 mm short axis. No mediastinal adenopathy. The esophagus is grossly unremarkable. No mediastinal fluid collection. Lungs/Pleura: Trace left pleural effusion. There are bibasilar subpleural atelectasis. Pneumonia is not excluded. There is no pneumothorax. The central airways are patent. Upper Abdomen: Better evaluated on CT of 10/31/2023. Musculoskeletal: Osteopenia with degenerative changes spine. Similar appearance of T8 compression fracture. No new fracture. Review of the MIP images confirms the above findings. IMPRESSION: 1. Bilateral lower lobe pulmonary artery emboli. No evidence of right heart straining. 2. Trace left pleural effusion and bibasilar subpleural atelectasis. Pneumonia is not excluded. 3.  Aortic Atherosclerosis (ICD10-I70.0). These results were called by telephone at the time of interpretation on 12/29/2023 at 4:55 pm to provider [facility] , who verbally acknowledged these results. Electronically Signed   By: Vanetta Chou M.D.   On: 12/29/2023 17:00  Medications: I have reviewed the patient's current medications.  Assessment/Plan:  Gastric cancer with CT evidence of abdominal carcinomatosis CT abdomen/pelvis 04/13/2022-ascites, diffuse omental and peritoneal nodularity, possible circumferential mass in the transverse colon, new small left greater than right pleural effusions CT abdominal mass biopsy 04/24/2022-metastatic poorly differentiated adenocarcinoma with very focal signet ring cell features CT paracentesis 04/24/2022-no malignant cells identified Colonoscopy 04/25/2022-diverticulosis in the sigmoid colon.  Internal  hemorrhoids.  No specimens collected. Upper endoscopy 04/25/2022-large infiltrative mass with oozing bleeding and stigmata of recent bleeding in the gastric fundus, on the anterior wall of the gastric body and on the greater curvature of the gastric body; deformity in the gastric antrum.  Extrinsic deformity in the entire duodenum.  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 Her2 by IHC negative, 1+; MMR IHC normal; PD-L1 CPS 1%, positive. Claudin 18 positive, 100% Cycle 1 FOLFOX 05/04/2022 Cycle 2 FOLFOX 05/17/2022 Cycle 3 FOLFOX 05/31/2022-oxaliplatin  dose reduced secondary to prolonged cold sensitivity Cycle 4 FOLFOX 06/14/2022 Cycle 5 FOLFOX 06/28/2022 CT abdomen/pelvis 07/08/2022-asymmetric wall thickening at the lesser curvature of the stomach, decreased ascites, increased diffuse omental and peritoneal nodularity Cycle 6 FOLFOX 07/12/2022 Cycle 7 FOLFOX 08/09/2022 Cycle 8 FOLFOX 08/23/2022 Cycle 9 FOLFOX 09/06/2022 CTs 09/11/2022-stable, no new or progressive interval findings Cycle 10 FOLFOX 09/20/2022 Xeloda  maintenance 2 weeks on/1 week off 10/16/2022 Cycle 2 Xeloda  11/06/2022-Xeloda  dose reduced secondary to early foot skin toxicity Cycle 3 Xeloda  11/27/2022 Cycle 4 Xeloda  12/18/2022 CTs 01/02/2023-wall thickening of the mid gastric body-possibly progressive, unchanged peritoneal/omental caking, unchanged L1 compression fracture Cycle 5 Xeloda  beginning 01/08/2023 Cycle 6 Xeloda  beginning 01/29/2023 Cycle 7 Xeloda  beginning 02/19/2023 Cycle 8 Xeloda  beginning 03/12/2023 Cycle 9 Xeloda  beginning 04/09/2023 Cycle 10 Xeloda  beginning 05/07/2023 CTs 05/21/2023: Unchanged wall thickening of the gastric body, slight increase in small volume ascites, diffuse peritoneal thickening and stranding of the omentum, no evidence of metastatic disease to the chest Cycle 11 Xeloda  beginning 05/28/2023 Cycle 12 Xeloda  beginning 06/18/2023 Cycle 13 Xeloda  beginning 07/09/2023 Cycle  14 Xeloda  beginning 07/30/2023 CTA abdomen/pelvis 08/14/2023: Large volume of heterogenous material in the gastric lumen-food/blood, small volume ascites with diffuse peritoneal thickening and matting of the bowel/omentum, enhancement of the gastric body 08/18/2023: EGD-large mass in the gastric fundus/body with no bleeding, clean-based ulcers overlying 1 area of the mass, normal duodenum Cycle 15 Xeloda  09/03/2023 Cycle 16 Xeloda  09/24/2023 CTs 10/31/2023-moderate gastric body wall thickening similar.  Ill-definition of soft tissue planes in the lesser sac and porta hepatis new or progressive.  Development of intrahepatic duct dilatation.  Increase in small volume abdominal ascites with persistent peritoneal metastasis.  New trace left pleural fluid.  New T8 heterogeneous sclerosis and vertebral body height loss. Cycle 1 FOLFOX plus zolbetuximab beginning 11/14/2023 (zolbetuximab 11/14/2023, FOLFOX 11/15/2023) Cycle 2 FOLFOX plus zolbetuximab (zolbetuximab 11/28/23, FOLFOX anticipated 11/29/23) Cycle 3 FOLFOX plus zolbetuximab 12/12/2023 Cycle 4 FOLFOX plus zolbetuximab 12/27/2023 Truncal rash-status post recent punch biopsy with pathology pending; biopsy reported negative per family 04/26/2022 Lichen planus of the feet Hypothyroidism Atrial fibrillation Admission 07/24/2022 with acute bilateral pulmonary embolism/right heart strain Heparin  anticoagulation 07/24/2022 Doppler 07/24/2022-acute DVT of the right femoral, popliteal, posterior tibial, and peroneal veins, acute left posterior tibial DVT Lovenox  anticoagulation, changed to once daily dosing 05/25/2023 Lovenox  changed to 40 mg daily 08/30/2023 7.  Anemia secondary to chemotherapy, phlebotomy, and hemoptysis-improved 8.  L1 compression fracture on plain x-ray 12/18/2022 MRI lumbar spine 02/02/2023-acute/subacute L1 compression fracture, mild degenerative change of the lumbar spine 9.  Admission  08/14/2023 with hematemesis 10. C. Difficile positive by  PCR-completed course of Vancomycin  11.  Admission 12/29/2023 with bilateral pulmonary embolism-heparin  12.  Diffuse pruritic rash-etiology?   Todd Harrison is admitted with recurrent pulmonary embolism while maintained on Lovenox  anticoagulation.  Reviewed the chest CT images.  He resumed Lovenox  at a prophylactic dose on 08/30/2023 after admission with GI bleeding in early April 2025.  I discussed the difficult situation with Todd Harrison and his wife.  He has a history of recurrent pulmonary embolism in the setting of metastatic gastric cancer.  A gastric tumor remains in place and he was admitted with upper GI bleeding in early April. Treatment options include resuming full dose Lovenox  anticoagulation or changing to a different form of anticoagulation such as a DOAC or Coumadin.  We can consider IVC filter placement, but he would eat anticoagulation with the filter in place.  He has completed 4 cycles of FOLFOX plus zolbetuximab.  His clinical status appears unchanged.  I I recommend obtaining a CT abdomen/pelvis to reassess the carcinomatosis.  Todd Harrison has advanced metastatic gastric cancer presenting with carcinomatosis in December 2023.  The goals of systemic therapy are to palliate symptoms and potentially extend survival.  No therapy will be curative.  We began a discussion regarding current goals of care and hospice care.  The etiology of the pruritic rash is unclear.  The rash has the appearance of a drug rash.  I doubt the rash is related to chemotherapy as he has completed 3 cycles without development of a rash.  Recommendations: Continue heparin  anticoagulation, discuss coagulation options with Todd Harrison and his family Goals of care discussion, agree with palliative care consult Restaging CT abdomen/pelvis-I will order for 01/01/2024 Continue TPN Agree with trial of steroids for the skin rash Outpatient follow-up as scheduled at the Cancer center    LOS: 1 day   Arley Hof, MD    12/31/2023, 8:02 AM

## 2023-12-31 NOTE — Plan of Care (Signed)
 Palliative-   Consult received. Chart reviewed. Spoke with patient's spouse. Plan to meet at 12N today.   Cassondra Stain, AGNP-C Palliative Medicine  No charge

## 2023-12-31 NOTE — Progress Notes (Signed)
 PHARMACY - ANTICOAGULATION CONSULT NOTE  Pharmacy Consult for heparin  Indication: pulmonary embolus  Allergies  Allergen Reactions   Gold Itching   Mixed Grasses     unknown   Hydroxyquinolines Rash    Patient Measurements: Height: 5' 11 (180.3 cm) Weight: 90.7 kg (200 lb) IBW/kg (Calculated) : 75.3 HEPARIN  DW (KG): 90.7  Vital Signs: Temp: 98.4 F (36.9 C) (08/25 0403) Temp Source: Oral (08/25 0403) BP: 91/56 (08/25 0403) Pulse Rate: 66 (08/25 0403)  Labs: Recent Labs    12/29/23 1544 12/29/23 2055 12/30/23 0152 12/30/23 0940 12/30/23 2012 12/31/23 1034  HGB 10.5*   < > 8.9*  8.8* 8.8*  --  9.5*  HCT 32.5*   < > 28.8*  29.2* 27.8*  --  31.0*  PLT 107*  --  100*  --   --  116*  LABPROT 14.4  --  16.7*  --   --   --   INR 1.1  --  1.3*  --   --   --   HEPARINUNFRC  --    < > 0.35 0.30 0.41 0.40  CREATININE 0.83  --  0.69  --   --  0.58*   < > = values in this interval not displayed.    Estimated Creatinine Clearance: 92 mL/min (A) (by C-G formula based on SCr of 0.58 mg/dL (L)).  Assessment: 30 YOM presenting with dyspnea, hx gastric cancer, acute GIB in April 2025, anemia of chronic disease, and VTE on lovenox  PTA (DVT ppx dosing 40mg  daily), missed some doses recently including today (none today). CT angio chest revealed new PE with no right heart strain present. Pharmacy consulted to start heparin .   Today, 12/31/2023 HL 0.40 remains therapeutic with heparin  infusing at 1600 units/hr Hgb 8.5 (BL 9-12) PLTc low stable - 116 No complications of therapy noted No bleeding reported  Goal of Therapy:  Heparin  level 0.3-0.7 units/ml Monitor platelets by anticoagulation protocol: Yes   Plan:  -Continue heparin  infusion at 1600 units/hr -Daily heparin  level and CBC -F/u long term AC plan per Onc  Rosaline IVAR Edison, Pharm.D Use secure chat for questions 12/31/2023 12:11 PM

## 2023-12-31 NOTE — Consult Note (Addendum)
 Consultation Note Date: 12/31/2023   Patient Name: Todd Harrison  DOB: 1945/06/27  MRN: [id number]  Age / Sex: 78 y.o., male  PCP: Henry Ingle, MD Referring Physician: Zachary Ba, MD  Reason for Consultation:  recurrent pulmonary emboli in the setting of gastric cancer  HPI/Patient Profile: 78 y.o. male  with past medical history of stage IV gastric cancer with peritoneal mets currently on FOLFOX/zolbetuximab- last dose 12/27/23, a-fib, bilateral PE's with DVT- on Lovenox , hospitalization 08/2023 for GI hemorrhage requiring intubation, dHF, chronic diarrhea (on TPN), presented to ED on 12/29/2023 with hemoptysis.  CTA of chest indicated bilateral lower lobe PE- no heart strain. Started on heparin  drip. High risk for recurrent hemorrhage of gastric malignancy. Palliative consulted for discussion of options in the event that his clinical status declines. There is concern that there will be difficulty reaching adequate anticoagulation due to his continued hemoptysis and risk for GI hemorrhage.   Primary Decision Maker PATIENT- surrogate is spouse- Faron Tudisco  Discussion: Chart reviewed including labs, progress notes, imaging from this and previous encounters.  I previously saw patient in April of this year.  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  At that time plan was for continued aggressive interventions, full code. Was referred to outpatient Palliative at [facility] but consult was deferred by Dr. Cloretta.  Chart reviewed- CT scan reviewed- noted bilateral PE. Reviewed chemotherapy history flowsheet.  Labs- CBC- hgb 10.5 on admission, 9.5 today. WBC stable. Platelets 116 today. Albumin decreased- 2.0. Admission and attending notes reviewed, outpatient Oncology notes reviewed.  Met at bedside with patient, his spouse,  and children.  Reviewed his situation over the last few months. He has had to start on TPN due to poor po intake. He has continued to work intermittently when he feels up to it. He remains independent with ADLs. He feels he is tolerating his chemotherapy.  We discussed his current acute issues in the setting of his chronic cancer.  He and his family understand he is high risk for decompensation and complications from anticoagulation. They are hoping to speak with Dr. Cloretta and gather his input regarding treatment recommendations.  Remaining independent and not being a burden on his family is a strong value for him. He feels that if he got to the point where he would not be able to care for himself then that would be a sign that he would no longer wish to do life prolonging interventions and would prefer comfort and hospice care.  We discussed the trajectory of cancer therapies- how treatment continues until progression, until there are no more treatment options, or until patient does not feel treatment is improving function or quality of life.  We also discussed the fact that he has additional life threatening complication of PE with needed anticoagulation that is complicated by risk of gastric bleeding.  Advanced Care Planning- 30 mins Due to his multiple comorbidities Octaviano and his family agreed to ACP discussion.  Code status was discussed- he would not want CPR if he experienced cardiac and respiratory arrest. I recommended DNR status and he and family are in agreement with this.  We further discussed scope of interventions- he is uncertain if he would want to be intubated again. We discussed option of limited interventions- avoiding intubation but continuing other treatments vs comfort measures. He was not ready to make full decision today but will continue to discuss with family.  He has concerns about when it is time to stop his treatments- he worries about pain and debility. I recommended that at  that time a hospice referral would be appropriate to assist with good symptom management and care at end of life.   For now goals are to continue current interventions.   Symptom management-  Rash- appears to be skin reaction likely from zolbetuximab. His spouse noted that he did not receive dexamethasone  on day 2 of this chemo cycle- this could definitely be a reason he developed delayed skin reaction. Attending team is treating with atarax , oral dexamethasone  and clobetasol . I will add aquaphor emollient BID.  Sleep- he is not sleeping well. I recommended trazadone and he and family are in agreement.  Diarrhea- having clear movements about 1-2 times a day. Doesn't bother him too much. We discussed likely due to lack of bulk in his diet. No need to treat at this time.  Pain- he denies need to address pain today.  Cough/vomiting- his spouse reports buildup of mucous that leads to him coughing and vomiting around 3 times a week- discussed trial of scope patch, however he wishes to defer for now Needle sticks- he is tired of lab draws and needle sticks- PICC line may be a good idea for labs and medications administration. This may also enable him to continue to receive his TPN on his chemotherapy days since his TPN is incompatible with his chemotherapy. Discussed dc of glucose fingersticks with attending and pharmacy- he has been on TPN chronically at home with no issues and doesn't check glucose at home, numbers have been stable during admission.      SUMMARY OF RECOMMENDATIONS -Goals of care- continue current interventions- monitor for decompensation or complications with anticoagulation -Code status changed to DNR- pre arrest interventions/full scope care desired   -See above for symptom management recommendations -Plan to meet again tomorrow for followup at 12N  Code Status/Advance Care Planning:   Code Status: Do not attempt resuscitation (DNR) PRE-ARREST INTERVENTIONS DESIRED    Prognosis:    Unable to determine  Discharge Planning: To Be Determined  Primary Diagnoses: Present on Admission:  Acute pulmonary embolism without acute cor pulmonale (HCC)  Cough with hemoptysis  Pressure injury of skin-Pressure injury stage 2 right buttocks and sacrum, present on admission (at the time of the admission order)   Chronic diarrhea  Thrombocytopenia (HCC)  Gastric cancer (HCC)  Acquired hypothyroidism  Paroxysmal atrial fibrillation (HCC)  Chronic diastolic CHF (congestive heart failure) (HCC)  GERD (gastroesophageal reflux disease)  Anemia of chronic disease  Acute pulmonary embolism (HCC)   Review of Systems  Constitutional:  Positive for appetite change and fatigue.  Gastrointestinal:  Positive for diarrhea.    Physical Exam Vitals and nursing note reviewed.  Constitutional:      General: He is not in acute distress. Cardiovascular:     Rate and Rhythm: Normal rate.  Pulmonary:     Effort: Pulmonary effort is normal.  Neurological:     Mental Status: He is alert and  oriented to person, place, and time.   Skin: Pressure injury stage 2 right buttocks and sacrum, present on admission (at the time of the admission order)   Vital Signs: BP (!) 91/56 (BP Location: Right Arm)   Pulse 66   Temp 98.4 F (36.9 C) (Oral)   Resp 15   Ht 5' 11 (1.803 m)   Wt 90.7 kg   SpO2 95%   BMI 27.89 kg/m  Pain Scale: 0-10   Pain Score: 0-No pain   SpO2: SpO2: 95 % O2 Device:SpO2: 95 % O2 Flow Rate: .   IO: Intake/output summary:  Intake/Output Summary (Last 24 hours) at 12/31/2023 1341 Last data filed at 12/31/2023 0600 Gross per 24 hour  Intake 1525.32 ml  Output 500 ml  Net 1025.32 ml    LBM: Last BM Date : 12/30/23 Baseline Weight: Weight: 90.7 kg Most recent weight: Weight: 90.7 kg       Thank you for this consult. Palliative medicine will continue to follow and assist as needed.   Signed by: Cassondra Stain, AGNP-C Palliative Medicine  Time includes:    Preparing to see the patient (e.g., review of tests) Obtaining and/or reviewing separately obtained history Performing a medically necessary appropriate examination and/or evaluation Counseling and educating the patient/family/caregiver Ordering medications, tests, or procedures Referring and communicating with other health care professionals (when not reported separately) Documenting clinical information in the electronic or other health record Independently interpreting results (not reported separately) and communicating results to the patient/family/caregiver Care coordination (not reported separately) Clinical documentation   Please contact Palliative Medicine Team phone at [phone] for questions and concerns.  For individual provider: See Tracey

## 2023-12-31 NOTE — TOC Initial Note (Signed)
 Transition of Care [facility]) - Initial/Assessment Note    Patient Details  Name: Todd Harrison MRN: [id number] Date of Birth: March 26, 1946  Transition of Care [facility] CM/SW Contact:    Bascom Service, RN Phone Number: 12/31/2023, 11:32 AM  Clinical Narrative: Active w/[employer] rep Pam for iv TPN, HHRN. Await post palliative care meeting outcome;recc.                  Expected Discharge Plan: Home w Home Health Services Barriers to Discharge: Continued Medical Work up   Patient Goals and CMS Choice Patient states their goals for this hospitalization and ongoing recovery are:: Home CMS Medicare.gov Compare Post Acute Care list provided to:: Patient Represenative (must comment) (Keevie(spouse)) Choice offered to / list presented to : Spouse Espino ownership interest in [facility].provided to:: Spouse    Expected Discharge Plan and Services   Discharge Planning Services: CM Consult Post Acute Care Choice: Home Health Living arrangements for the past 2 months: Single Family Home                                      Prior Living Arrangements/Services Living arrangements for the past 2 months: Single Family Home Lives with:: Spouse Patient language and need for interpreter reviewed:: Yes Do you feel safe going back to the place where you live?: Yes      Need for Family Participation in Patient Care: Yes (Comment) Care giver support system in place?: Yes (comment) Current home services: DME, Home RN, Other (comment) (Active w/[employer](iv TPN, HHRN)) Criminal Activity/Legal Involvement Pertinent to Current Situation/Hospitalization: No - Comment as needed  Activities of Daily Living   ADL Screening (condition at time of admission) Independently performs ADLs?: Yes (appropriate for developmental age) Is the patient deaf or have difficulty hearing?: No Does the patient have difficulty seeing, even when wearing glasses/contacts?: No Does the patient have  difficulty concentrating, remembering, or making decisions?: No  Permission Sought/Granted Permission sought to share information with : Case Manager Permission granted to share information with : Yes, Verbal Permission Granted  Share Information with NAME: Case Manager     Permission granted to share info w Relationship: Duffy Dantonio (spouse) [phone]     Emotional Assessment Appearance:: Appears stated age Attitude/Demeanor/Rapport: Gracious Affect (typically observed): Accepting Orientation: : Oriented to Self, Oriented to Place, Oriented to  Time, Oriented to Situation Alcohol  / Substance Use: Not Applicable Psych Involvement: No (comment)  Admission diagnosis:  Acute pulmonary embolism without acute cor pulmonale (HCC) [I26.99] Acute pulmonary embolism (HCC) [I26.99] Patient Active Problem List   Diagnosis Date Noted   Chronic anticoagulation 12/30/2023   Goals of care, counseling/discussion 12/30/2023   Acute pulmonary embolism (HCC) 12/30/2023   Acute pulmonary embolism without acute cor pulmonale (HCC) 12/29/2023   Cough with hemoptysis 12/29/2023   Pressure injury of skin 12/29/2023   Chronic diarrhea 12/29/2023   Thrombocytopenia (HCC) 12/29/2023   Gastric cancer (HCC) 12/29/2023   Chronic diastolic CHF (congestive heart failure) (HCC) 12/29/2023   GERD (gastroesophageal reflux disease) 12/29/2023   Anemia of chronic disease 12/29/2023   Cervical disc disorder with radiculopathy of cervical region 11/08/2023   ABLA (acute blood loss anemia) 08/17/2023   Hemorrhagic shock (HCC) 08/15/2023   Hematemesis 08/14/2023   Acute upper GI bleed 08/14/2023   Malnutrition of moderate degree 07/25/2022   Hemoptysis 07/25/2022   Infarct of lung (HCC) 07/25/2022   Pulmonary  embolus (HCC) 07/25/2022   Acute saddle pulmonary embolism with acute cor pulmonale (HCC) 07/24/2022   Paroxysmal atrial fibrillation (HCC) 07/24/2022   Lichen planus 07/24/2022   Malignant neoplasm  of stomach (HCC) 04/26/2022   Aortic atherosclerosis (HCC) 04/17/2022   Abnormal CT of the chest 12/16/2020   COVID-19 virus infection 11/25/2020   OSA (obstructive sleep apnea) 05/19/2020   Persistent atrial fibrillation (HCC)    Daytime hypersomnolence 02/16/2020   Tachycardia 01/22/2020   Atrial fibrillation with RVR (HCC) 01/22/2020   Pneumonia 01/09/2020   Asthmatic bronchitis 10/20/2019   Methotrexate, long term, current use    Cough present for greater than 3 weeks 04/30/2019   Shortness of breath 04/30/2019   Complicated acute bronchitis 04/30/2019   Allergic contact dermatitis 09/09/2018   Acquired hypothyroidism 06/09/2016   Rotator cuff tear, right 09/22/2010   METATARSALGIA 11/24/2008   FOOT PAIN, LEFT 11/24/2008   TALIPES CAVUS 11/24/2008   PCP:  Henry Ingle, MD Pharmacy:   [id number] [place],  - [address] [place] [address] Phone: [phone] Fax: [phone]  [facility] [address] [place] [address] Phone: [phone] Fax: [phone]   - [facility] [address] [place] [address] Phone: [phone] Fax: [phone]  [place] [facility] [address] [place] [address] Phone: [phone] Fax: [phone]  [id number] - [place], [place] - [address] AT [facility] [address] [place] [address] Phone: [phone] Fax: [phone]     Social Drivers of Health (SDOH) Social History: SDOH Screenings   Food Insecurity: No Food Insecurity (12/30/2023)  Housing: Low Risk  (12/30/2023)  Transportation Needs: No Transportation Needs (12/30/2023)  Utilities: Not At Risk (12/30/2023)  Depression (PHQ2-9): Low Risk  (11/22/2023)  Financial Resource Strain: Low Risk  (04/17/2022)  Social Connections: Moderately Isolated  (12/29/2023)  Stress: No Stress Concern Present (04/17/2022)  Tobacco Use: Medium Risk (12/29/2023)   SDOH Interventions: Food Insecurity Interventions: Intervention Not Indicated, Inpatient TOC Housing Interventions: Intervention Not Indicated, Inpatient TOC Transportation Interventions: Intervention Not Indicated, Inpatient TOC Utilities Interventions: Intervention Not Indicated, Inpatient TOC   Readmission Risk Interventions    07/28/2022    2:24 PM  Readmission Risk Prevention Plan  Transportation Screening Complete  PCP or Specialist Appt within 5-7 Days Complete  Home Care Screening Complete  Medication Review (RN CM) Complete

## 2023-12-31 NOTE — Progress Notes (Signed)
 PROGRESS NOTE    Todd Harrison  FMW:[id number] DOB: 1945-05-10 DOA: 12/29/2023 PCP: Henry Ingle, MD    Brief Narrative:  78 year old gentleman with history of metastatic gastric cancer currently on chemotherapy, paroxysmal A-fib, bilateral pulmonary embolism with concurrent DVT who was on prophylactic dose of Lovenox  at home 40 mg daily, previous upper GI bleeding and hemorrhagic shock requiring ICU management, chronic diastolic heart failure, anemia of chronic disease and chronic diarrhea admitted secondary to cough and hemoptysis, pleuritic chest pain.  Patient was found to have bilateral lower lobe PE without evidence of right heart strain.  Also with evidence of streaky hemoptysis. Patient has been started on heparin  infusion, has intermittent mild bleeding but no gross bleeding. Remains challenging to manage anticoagulation with high risk of rebleeding.  Followed by oncology and palliative care.  Subjective: Patient seen and examined.  His wife and daughter at the bedside.  Detailed discussion about ongoing symptom management.  Patient had 1 episode of cough with streaky hemoptysis today.  He also has swollen extremities and difficult to get blood drawn. Appetite is poor.  Tolerating TPN. Tolerating heparin  so far.  Hemoglobin is 9.5.  Electrolytes are adequate. Patient also complained of itchy rashes.  He was wondering whether we can increase his dose of hydroxyzine .  Assessment & Plan:   Acute pulmonary embolism without cor pulmonale, recurrent thromboembolism associated with malignant metastatic tumor.  Intolerance to anticoagulation. Currently hemodynamically stable. Currently on heparin  and tolerating with streaky hemoptysis.  No evidence of gross  bleeding. Continue heparin .  Challenging to manage anticoagulation. Prophylactic dose of Lovenox  40 mg daily will not suffice to prevent malignancy related thromboembolism.  Patient will need Lovenox  1 mg/kgtwice daily to prevent  thromboembolism from his metastatic cancer.  Hopefully we can challenge him with this in the hospital before discharge.  Remains on PPI prophylaxis. On empiric Rocephin  and azithromycin .  Continue Rocephin  today.  Discontinue azithromycin .  Metastatic gastric cancer, high risk of bleeding.  Cough with hemoptysis: On PPI. Gastric malignancy with gastric mass, currently receiving chemotherapy with FOLFOX. Followed by oncology. Decadron , increase dose of hydroxyzine  for symptom relief and itching.  Chronic diastolic heart failure: Currently euvolemic.  GERD: On PPI as above.  Severe protein calorie malnutrition: Nutrition Status: Nutrition Problem: Increased nutrient needs Etiology: cancer and cancer related treatments Signs/Symptoms: estimated needs Interventions: TPN, Ensure Enlive (each supplement provides 350kcal and 20 grams of protein)    Goal of care discussion: Discussed in detail with patient and family at the bedside.  Patient is with extreme guarded prognosis. If unable to tolerate anticoagulation, patient will not be able to treated for thromboembolism and this will be fatal. If patient has rebleeding on anticoagulation, this could be life-threatening and fatal. If agreed upon by patient and family, he is appropriate for palliative and hospice care. Family meeting with palliative care team today.    DVT prophylaxis: Heparin  infusion   Code Status: DNR with intervention Family Communication: Wife and daughter at the bedside Disposition Plan: Status is: Inpatient Remains inpatient appropriate because:  Significant symptoms, heparin  infusion   Consultants:  Oncology Palliative care  Procedures:  None  Antimicrobials:  Rocephin  and azithromycin  8/23---     Objective: Vitals:   12/30/23 0824 12/30/23 1432 12/30/23 2126 12/31/23 0403  BP: 102/62 103/62 108/64 (!) 91/56  Pulse: 71 73 67 66  Resp: 20 16 15 15   Temp: 98.2 F (36.8 C) 97.9 F (36.6 C) 98.4  F (36.9 C) 98.4 F (36.9 C)  TempSrc:  Oral Oral Oral Oral  SpO2: 96% 97% 96% 95%  Weight:      Height:        Intake/Output Summary (Last 24 hours) at 12/31/2023 1347 Last data filed at 12/31/2023 0600 Gross per 24 hour  Intake 1525.32 ml  Output 500 ml  Net 1025.32 ml   Filed Weights   12/29/23 1513  Weight: 90.7 kg    Examination:  General exam: Appears calm and comfortable.  Chronically sick looking.  Frail and debilitated. Respiratory system: Clear to auscultation. Respiratory effort normal.  Some conducted upper airway sounds.  On room air. Cardiovascular system: S1 & S2 heard, RRR.  Gastrointestinal system: Soft.  Nontender.  Bowel sound present. Central nervous system: Alert and oriented. No focal neurological deficits.  Gross generalized weakness. Anasarca.  Multiple ecchymotic lesions on both upper extremities. Patient has macular rashes mostly on the flexor aspects of the thighs also on the trunk and abdomen. Port-A-Cath right chest wall.    Data Reviewed: I have personally reviewed following labs and imaging studies  CBC: Recent Labs  Lab 12/26/23 1328 12/29/23 1544 12/29/23 2055 12/30/23 0152 12/30/23 0940 12/31/23 1034  WBC 5.3 5.2  --  4.3  --  3.2*  NEUTROABS 2.9 4.1  --  3.0  --  2.1  HGB 11.2* 10.5* 9.8* 8.9*  8.8* 8.8* 9.5*  HCT 35.4* 32.5* 32.9* 28.8*  29.2* 27.8* 31.0*  MCV 90.8 89.8  --  92.9  --  94.5  PLT 156 107*  --  100*  --  116*   Basic Metabolic Panel: Recent Labs  Lab 12/26/23 1328 12/29/23 0152 12/29/23 1544 12/30/23 0152 12/31/23 1034  NA 140  --  137 138 135  K 3.9  --  3.8 3.7 3.6  CL 104  --  104 105 105  CO2 24  --  22 23 22   GLUCOSE 112*  --  110* 93 98  BUN 22  --  23 19 11   CREATININE 0.77  --  0.83 0.69 0.58*  CALCIUM  8.9  --  8.3* 7.8* 8.0*  MG 2.2 2.0  --  2.1 1.9  PHOS 3.1  --   --  2.2* 1.9*   GFR: Estimated Creatinine Clearance: 92 mL/min (A) (by C-G formula based on SCr of 0.58 mg/dL (L)). Liver  Function Tests: Recent Labs  Lab 12/26/23 1328 12/29/23 1544 12/30/23 0152 12/31/23 1034  AST 71* 20 12* 12*  ALT 61* 28 20 16   ALKPHOS 701* 388* 252* 210*  BILITOT 0.6 0.6 0.7 0.7  PROT 5.6* 5.3* 4.8* 5.0*  ALBUMIN 3.1* 2.8* 1.8* 2.0*   No results for input(s): LIPASE, AMYLASE in the last 168 hours. No results for input(s): AMMONIA in the last 168 hours. Coagulation Profile: Recent Labs  Lab 12/29/23 1544 12/30/23 0152  INR 1.1 1.3*   Cardiac Enzymes: No results for input(s): CKTOTAL, CKMB, CKMBINDEX, TROPONINI in the last 168 hours. BNP (last 3 results) Recent Labs    12/29/23 1544  PROBNP 550.0*   HbA1C: No results for input(s): HGBA1C in the last 72 hours. CBG: Recent Labs  Lab 12/30/23 1158 12/30/23 1811 12/30/23 2028 12/30/23 2336 12/31/23 0616  GLUCAP 82 84 106* 90 95   Lipid Profile: Recent Labs    12/30/23 0152 12/31/23 1034  TRIG 102 113   Thyroid  Function Tests: Recent Labs    12/30/23 0152  TSH 8.254*   Anemia Panel: Recent Labs    12/31/23 1034  VITAMINB12 368  FOLATE 39.3  FERRITIN 171  TIBC 237*  IRON 111   Sepsis Labs: Recent Labs  Lab 12/29/23 0152  PROCALCITON 0.29    Recent Results (from the past 240 hours)  Resp panel by RT-PCR (RSV, Flu A&B, Covid) Anterior Nasal Swab     Status: None   Collection Time: 12/29/23  3:16 PM   Specimen: Anterior Nasal Swab  Result Value Ref Range Status   SARS Coronavirus 2 by RT PCR NEGATIVE NEGATIVE Final    Comment: (NOTE) SARS-CoV-2 target nucleic acids are NOT DETECTED.  The SARS-CoV-2 RNA is generally detectable in upper respiratory specimens during the acute phase of infection. The lowest concentration of SARS-CoV-2 viral copies this assay can detect is 138 copies/mL. A negative result does not preclude SARS-Cov-2 infection and should not be used as the sole basis for treatment or other patient management decisions. A negative result may occur with   improper specimen collection/handling, submission of specimen other than nasopharyngeal swab, presence of viral mutation(s) within the areas targeted by this assay, and inadequate number of viral copies(<138 copies/mL). A negative result must be combined with clinical observations, patient history, and epidemiological information. The expected result is Negative.  Fact Sheet for Patients:  BloggerCourse.com  Fact Sheet for Healthcare Providers:  SeriousBroker.it  This test is no t yet approved or cleared by the [place]  FDA and  has been authorized for detection and/or diagnosis of SARS-CoV-2 by FDA under an Emergency Use Authorization (EUA). This EUA will remain  in effect (meaning this test can be used) for the duration of the COVID-19 declaration under Section 564(b)(1) of the Act, 21 U.S.C.section 360bbb-3(b)(1), unless the authorization is terminated  or revoked sooner.       Influenza A by PCR NEGATIVE NEGATIVE Final   Influenza B by PCR NEGATIVE NEGATIVE Final    Comment: (NOTE) The Xpert Xpress SARS-CoV-2/FLU/RSV plus assay is intended as an aid in the diagnosis of influenza from Nasopharyngeal swab specimens and should not be used as a sole basis for treatment. Nasal washings and aspirates are unacceptable for Xpert Xpress SARS-CoV-2/FLU/RSV testing.  Fact Sheet for Patients: BloggerCourse.com  Fact Sheet for Healthcare Providers: SeriousBroker.it  This test is not yet approved or cleared by the [place]  FDA and has been authorized for detection and/or diagnosis of SARS-CoV-2 by FDA under an Emergency Use Authorization (EUA). This EUA will remain in effect (meaning this test can be used) for the duration of the COVID-19 declaration under Section 564(b)(1) of the Act, 21 U.S.C. section 360bbb-3(b)(1), unless the authorization is terminated or revoked.      Resp Syncytial Virus by PCR NEGATIVE NEGATIVE Final    Comment: (NOTE) Fact Sheet for Patients: BloggerCourse.com  Fact Sheet for Healthcare Providers: SeriousBroker.it  This test is not yet approved or cleared by the [place]  FDA and has been authorized for detection and/or diagnosis of SARS-CoV-2 by FDA under an Emergency Use Authorization (EUA). This EUA will remain in effect (meaning this test can be used) for the duration of the COVID-19 declaration under Section 564(b)(1) of the Act, 21 U.S.C. section 360bbb-3(b)(1), unless the authorization is terminated or revoked.  Performed at [facility], [address], [place], [place] [address]          Radiology Studies: ECHOCARDIOGRAM COMPLETE Result Date: 12/30/2023    ECHOCARDIOGRAM REPORT   Patient Name:   TEHRAN RABENOLD Date of Exam: 12/30/2023 Medical Rec #:  [id number]       Height:  71.0 in Accession #:    [id number]      Weight:       200.0 lb Date of Birth:  1945-07-26      BSA:          2.109 m Patient Age:    77 years        BP:           96/59 mmHg Patient Gender: M               HR:           58 bpm. Exam Location:  Inpatient Procedure: 2D Echo, Cardiac Doppler and Color Doppler (Both Spectral and Color            Flow Doppler were utilized during procedure). Indications:    CHF  History:        Patient has prior history of Echocardiogram examinations, most                 recent 07/25/2022. Arrythmias:Atrial Fibrillation; Risk                 Factors:Sleep Apnea.  Sonographer:    Jayson Gaskins Referring Phys: [id number] JUSTIN B HOWERTER IMPRESSIONS  1. Left ventricular ejection fraction, by estimation, is 55 to 60%. The left ventricle has normal function. The left ventricle has no regional wall motion abnormalities. There is moderate left ventricular hypertrophy of the basal-septal segment. Left ventricular diastolic parameters are consistent with  Grade I diastolic dysfunction (impaired relaxation).  2. Right ventricular systolic function is normal. The right ventricular size is normal.  3. Left atrial size was severely dilated.  4. Right atrial size was severely dilated.  5. The mitral valve is normal in structure. Trivial mitral valve regurgitation.  6. The aortic valve is tricuspid. Aortic valve regurgitation is not visualized. Moderate aortic valve stenosis. Aortic valve area, by VTI measures 1.01 cm. Aortic valve mean gradient measures 28.0 mmHg. Aortic valve Vmax measures 3.47 m/s. Peak gradient 48.2 mmHg, DI 0.32.  7. Aortic dilatation noted. There is mild dilatation of the aortic root, measuring 40 mm. There is mild dilatation of the ascending aorta, measuring 41 mm. Comparison(s): Changes from prior study are noted. 07/25/2022: LVEF 55-60%, mild AS, MG 15 mmHg, dilated aorta to 40 mm. FINDINGS  Left Ventricle: Left ventricular ejection fraction, by estimation, is 55 to 60%. The left ventricle has normal function. The left ventricle has no regional wall motion abnormalities. The left ventricular internal cavity size was normal in size. There is  moderate left ventricular hypertrophy of the basal-septal segment. Left ventricular diastolic parameters are consistent with Grade I diastolic dysfunction (impaired relaxation). Indeterminate filling pressures. Right Ventricle: The right ventricular size is normal. No increase in right ventricular wall thickness. Right ventricular systolic function is normal. Left Atrium: Left atrial size was severely dilated. Right Atrium: Right atrial size was severely dilated. Pericardium: There is no evidence of pericardial effusion. Mitral Valve: The mitral valve is normal in structure. Trivial mitral valve regurgitation. Tricuspid Valve: The tricuspid valve is grossly normal. Tricuspid valve regurgitation is mild. Aortic Valve: The aortic valve is tricuspid. Aortic valve regurgitation is not visualized. Moderate aortic  stenosis is present. Aortic valve mean gradient measures 28.0 mmHg. Aortic valve peak gradient measures 48.2 mmHg. Aortic valve area, by VTI measures 1.01 cm. Pulmonic Valve: The pulmonic valve was grossly normal. Pulmonic valve regurgitation is trivial. Aorta: Aortic dilatation noted. There is mild dilatation of the aortic root, measuring 40 mm.  There is mild dilatation of the ascending aorta, measuring 41 mm. Venous: The inferior vena cava was not well visualized. IAS/Shunts: No atrial level shunt detected by color flow Doppler.  LEFT VENTRICLE PLAX 2D LVIDd:         5.00 cm   Diastology LVIDs:         3.50 cm   LV e' medial:    6.96 cm/s LV PW:         1.30 cm   LV E/e' medial:  12.9 LV IVS:        1.10 cm   LV e' lateral:   11.20 cm/s LVOT diam:     2.00 cm   LV E/e' lateral: 8.0 LV SV:         83 LV SV Index:   39 LVOT Area:     3.14 cm  RIGHT VENTRICLE RV S prime:     18.90 cm/s TAPSE (M-mode): 2.9 cm LEFT ATRIUM              Index        RIGHT ATRIUM           Index LA Vol (A2C):   92.1 ml  43.68 ml/m  RA Area:     31.80 cm LA Vol (A4C):   106.0 ml 50.27 ml/m  RA Volume:   125.00 ml 59.28 ml/m LA Biplane Vol: 99.1 ml  47.00 ml/m  AORTIC VALVE AV Area (Vmax):    0.94 cm AV Area (Vmean):   0.92 cm AV Area (VTI):     1.01 cm AV Vmax:           347.00 cm/s AV Vmean:          252.000 cm/s AV VTI:            0.822 m AV Peak Grad:      48.2 mmHg AV Mean Grad:      28.0 mmHg LVOT Vmax:         104.00 cm/s LVOT Vmean:        74.100 cm/s LVOT VTI:          0.265 m LVOT/AV VTI ratio: 0.32  AORTA Ao Root diam: 4.00 cm MITRAL VALVE               TRICUSPID VALVE MV Area (PHT): 3.15 cm    TR Peak grad:   21.7 mmHg MV Decel Time: 241 msec    TR Vmax:        233.00 cm/s MV E velocity: 90.00 cm/s MV A velocity: 83.10 cm/s  SHUNTS MV E/A ratio:  1.08        Systemic VTI:  0.26 m                            Systemic Diam: 2.00 cm Vinie Maxcy MD Electronically signed by Vinie Maxcy MD Signature Date/Time:  12/30/2023/10:14:51 AM    Final    CT Angio Chest PE W and/or Wo Contrast Result Date: 12/29/2023 CLINICAL DATA:  Concern for embolism. EXAM: CT ANGIOGRAPHY CHEST WITH CONTRAST TECHNIQUE: Multidetector CT imaging of the chest was performed using the standard protocol during bolus administration of intravenous contrast. Multiplanar CT image reconstructions and MIPs were obtained to evaluate the vascular anatomy. RADIATION DOSE REDUCTION: This exam was performed according to the departmental dose-optimization program which includes automated exposure control, adjustment of the mA and/or kV according to patient size and/or use of  iterative reconstruction technique. CONTRAST:  80mL OMNIPAQUE  IOHEXOL  350 MG/ML SOLN COMPARISON:  Chest CT dated 10/31/2023. FINDINGS: Cardiovascular: There is no cardiomegaly or pericardial effusion. There is coronary vascular calcification. Mild atherosclerotic calcification of the thoracic aorta. No aneurysmal dilatation or dissection. Bilateral lower lobe pulmonary artery emboli involving the lobar and segmental branches. No evidence of right heart straining. Right-sided Port-A-Cath with tip at the cavoatrial junction. Mediastinum/Nodes: Mildly enlarged right hilar lymph node measures 13 mm short axis. No mediastinal adenopathy. The esophagus is grossly unremarkable. No mediastinal fluid collection. Lungs/Pleura: Trace left pleural effusion. There are bibasilar subpleural atelectasis. Pneumonia is not excluded. There is no pneumothorax. The central airways are patent. Upper Abdomen: Better evaluated on CT of 10/31/2023. Musculoskeletal: Osteopenia with degenerative changes spine. Similar appearance of T8 compression fracture. No new fracture. Review of the MIP images confirms the above findings. IMPRESSION: 1. Bilateral lower lobe pulmonary artery emboli. No evidence of right heart straining. 2. Trace left pleural effusion and bibasilar subpleural atelectasis. Pneumonia is not excluded.  3.  Aortic Atherosclerosis (ICD10-I70.0). These results were called by telephone at the time of interpretation on 12/29/2023 at 4:55 pm to provider [facility] , who verbally acknowledged these results. Electronically Signed   By: Vanetta Chou M.D.   On: 12/29/2023 17:00        Scheduled Meds:  Chlorhexidine  Gluconate Cloth  6 each Topical Daily   dexamethasone   6 mg Oral Daily   guaiFENesin   600 mg Oral BID   levothyroxine   112 mcg Oral Q0600   mineral oil-hydrophilic petrolatum   Topical BID   pantoprazole  (PROTONIX ) IV  40 mg Intravenous Q12H   phosphorus  500 mg Oral Q3H   Continuous Infusions:  cefTRIAXone  (ROCEPHIN )  IV 1 g (12/30/23 2158)   heparin  1,600 Units/hr (12/31/23 0038)   TPN ADULT (ION) 40 mL/hr at 12/30/23 1804   TPN ADULT (ION)       LOS: 1 day    Time spent: 55 minutes    Renato Applebaum, MD [facility]

## 2023-12-31 NOTE — Progress Notes (Addendum)
 PHARMACY - TOTAL PARENTERAL NUTRITION CONSULT NOTE   Indication: chronic TPN  Patient Measurements: Height: 5' 11 (180.3 cm) Weight: 90.7 kg (200 lb) IBW/kg (Calculated) : 75.3 TPN AdjBW (KG): 90.7 Body mass index is 27.89 kg/m. Usual Weight:   Assessment:  Patient is a 78yo M with PMH of metastatic gastric cancer undergoing chemotherapy on chronic TPN since July 2025 due to reduced po intake. Patient presented on 12/29/23 from [facility] for BL PE with no RHS. Pharmacy has been consulted to dose TPN while inpatient.   Glucose / Insulin : No hx of Dm  CBGs 90- 106 on TPN @ 40 ml/hr Electrolytes:   phos down to 1.9 after 15 mMol kphos given yesterday; all other lytes WNL including CoCa  Renal: Scr stable (BL 1-1.1), BUN WNL Hepatic: 8/25 AST slightly low, ALK phos elevated but downtrending Albumin low  Intake / Output; MIVF:  - UOP: 1100 ml - Stool: x 1 - PO intake: 50 ml - mIVF: LR at 132ml/hr  - expired 8/24 @ 1122 am GI Imaging: CT 6/25 - L hepatic lobe lesion suspicion, moderate gastric wall thickening. No gastric outlet obstruction GI Surgeries / Procedures:   Central access: chest port present on admission TPN start date: in patient: 12/30/2023 On TPN PTA  Nutritional Goals: Goal TPN rate is 85 mL/hr (provides ~ 112 g of protein and ~ 2240 kcals per day) PTA on cyclic TPN over 18 hours provided by  [facility]  RD Assessment: Estimated Needs Total Energy Estimated Needs: 2200-2400 Total Protein Estimated Needs: 110-130 grams Total Fluid Estimated Needs: >/= 2 L  Current Nutrition:  Regular diet  TPN @ 40 ml/hr Ensure plus high proteint BID>> all doses refused  Plan:  Now: Kphos 30 mMol IV NOT compatible with IV heparin : K Phos  neutral 500 mg q4h x 4 doses  Today, at 1800 increase TPN to 60 mL/hr at 1800 Electrolytes in TPN: Na 1mEq/L, K 49mEq/L, Ca 35mEq/L, Mg 92mEq/L, and Phos 15mmol/L. Cl:Ac 1:1 Add standard MVI and trace elements to TPN continue Sensitive  q6h SSI and adjust as needed  Monitor TPN labs on Mon/Thurs, CMET, Mg & Phos in AM   Todd Harrison, Pharm.D Use secure chat for questions 12/31/2023 11:43 AM

## 2024-01-01 ENCOUNTER — Inpatient Hospital Stay (HOSPITAL_COMMUNITY)

## 2024-01-01 DIAGNOSIS — C786 Secondary malignant neoplasm of retroperitoneum and peritoneum: Secondary | ICD-10-CM | POA: Diagnosis not present

## 2024-01-01 DIAGNOSIS — R042 Hemoptysis: Secondary | ICD-10-CM | POA: Diagnosis not present

## 2024-01-01 DIAGNOSIS — C169 Malignant neoplasm of stomach, unspecified: Secondary | ICD-10-CM | POA: Diagnosis not present

## 2024-01-01 DIAGNOSIS — C160 Malignant neoplasm of cardia: Secondary | ICD-10-CM | POA: Diagnosis not present

## 2024-01-01 DIAGNOSIS — Z86711 Personal history of pulmonary embolism: Secondary | ICD-10-CM

## 2024-01-01 DIAGNOSIS — I2699 Other pulmonary embolism without acute cor pulmonale: Secondary | ICD-10-CM | POA: Diagnosis not present

## 2024-01-01 DIAGNOSIS — Z7189 Other specified counseling: Secondary | ICD-10-CM | POA: Diagnosis not present

## 2024-01-01 DIAGNOSIS — E43 Unspecified severe protein-calorie malnutrition: Secondary | ICD-10-CM | POA: Insufficient documentation

## 2024-01-01 LAB — HEPARIN LEVEL (UNFRACTIONATED): Heparin Unfractionated: 0.37 [IU]/mL (ref 0.30–0.70)

## 2024-01-01 LAB — PHOSPHORUS: Phosphorus: 2.9 mg/dL (ref 2.5–4.6)

## 2024-01-01 LAB — BASIC METABOLIC PANEL WITH GFR
Anion gap: 6 (ref 5–15)
BUN: 12 mg/dL (ref 8–23)
CO2: 23 mmol/L (ref 22–32)
Calcium: 7.9 mg/dL — ABNORMAL LOW (ref 8.9–10.3)
Chloride: 109 mmol/L (ref 98–111)
Creatinine, Ser: 0.59 mg/dL — ABNORMAL LOW (ref 0.61–1.24)
GFR, Estimated: >60 mL/min (ref >60)
Glucose, Bld: 116 mg/dL — ABNORMAL HIGH (ref 70–99)
Potassium: 3.8 mmol/L (ref 3.5–5.1)
Sodium: 138 mmol/L (ref 135–145)

## 2024-01-01 LAB — CBC WITH DIFFERENTIAL/PLATELET
Abs Immature Granulocytes: 0.01 K/uL (ref 0.00–0.07)
Basophils Absolute: 0 K/uL (ref 0.0–0.1)
Basophils Relative: 0 %
Eosinophils Absolute: 0.1 K/uL (ref 0.0–0.5)
Eosinophils Relative: 3 %
HCT: 27.3 % — ABNORMAL LOW (ref 39.0–52.0)
Hemoglobin: 8.6 g/dL — ABNORMAL LOW (ref 13.0–17.0)
Immature Granulocytes: 0 %
Lymphocytes Relative: 31 %
Lymphs Abs: 0.9 K/uL (ref 0.7–4.0)
MCH: 29.1 pg (ref 26.0–34.0)
MCHC: 31.5 g/dL (ref 30.0–36.0)
MCV: 92.2 fL (ref 80.0–100.0)
Monocytes Absolute: 0.1 K/uL (ref 0.1–1.0)
Monocytes Relative: 4 %
Neutro Abs: 1.7 K/uL (ref 1.7–7.7)
Neutrophils Relative %: 62 %
Platelets: 131 K/uL — ABNORMAL LOW (ref 150–400)
RBC: 2.96 MIL/uL — ABNORMAL LOW (ref 4.22–5.81)
RDW: 19.5 % — ABNORMAL HIGH (ref 11.5–15.5)
WBC: 2.8 K/uL — ABNORMAL LOW (ref 4.0–10.5)
nRBC: 0 % (ref 0.0–0.2)

## 2024-01-01 LAB — MAGNESIUM: Magnesium: 2.1 mg/dL (ref 1.7–2.4)

## 2024-01-01 MED ORDER — IOHEXOL 9 MG/ML PO SOLN
1000.0000 mL | ORAL | Status: AC
  Administered 2024-01-01: 1000 mL via ORAL

## 2024-01-01 MED ORDER — IOHEXOL 300 MG/ML  SOLN
100.0000 mL | Freq: Once | INTRAMUSCULAR | Status: AC | PRN
  Administered 2024-01-01: 100 mL via INTRAVENOUS

## 2024-01-01 MED ORDER — TRAVASOL 10 % IV SOLN
INTRAVENOUS | Status: AC
  Filled 2024-01-01: qty 1122

## 2024-01-01 MED ORDER — IOHEXOL 9 MG/ML PO SOLN
ORAL | Status: AC
  Filled 2024-01-01: qty 1000

## 2024-01-01 NOTE — Progress Notes (Signed)
 IP PROGRESS NOTE  Subjective:   Mr. Beeks reports partial improvement in skin rash.  He continues to have pruritus, relieved with hydroxyzine .  His wife is at the bedside when I saw him at approximately 7 AM.  He reports an episode of hemoptysis last night.  Objective: Vital signs in last 24 hours: Blood pressure 108/67, pulse 70, temperature 98.2 F (36.8 C), temperature source Oral, resp. rate 20, height 5' 11 (1.803 m), weight 201 lb 9.6 oz (91.4 kg), SpO2 99%.  Intake/Output from previous day: 08/25 0701 - 08/26 0700 In: 1535.6 [I.V.:1435.6; IV Piggyback:100] Out: 1180 [Urine:1180]  Physical Exam:  HEENT: No thrush or ulcers Lungs: Decreased breath sounds at the lower posterior chest bilaterally, inspiratory rhonchi at the right posterior base, no respiratory distress Cardiac: Regular rate and rhythm Abdomen: No hepatosplenomegaly, nondistended, no mass Extremities: No leg edema Skin: Erythematous rash over the trunk, arms, and legs appears removed Vascular: Pitting edema at the mid arm bilaterally  Portacath/PICC-without erythema  Lab Results: Recent Labs    12/31/23 1034 01/01/24 0523  WBC 3.2* 2.8*  HGB 9.5* 8.6*  HCT 31.0* 27.3*  PLT 116* 131*    BMET Recent Labs    12/31/23 1034 01/01/24 0523  NA 135 138  K 3.6 3.8  CL 105 109  CO2 22 23  GLUCOSE 98 116*  BUN 11 12  CREATININE 0.58* 0.59*  CALCIUM  8.0* 7.9*    Lab Results  Component Value Date   CEA1 <0.6 04/13/2022   CEA 2.32 11/14/2023    Studies/Results: No results found.   Medications: I have reviewed the patient's current medications.  Assessment/Plan:  Gastric cancer with CT evidence of abdominal carcinomatosis CT abdomen/pelvis 04/13/2022-ascites, diffuse omental and peritoneal nodularity, possible circumferential mass in the transverse colon, new small left greater than right pleural effusions CT abdominal mass biopsy 04/24/2022-metastatic poorly differentiated adenocarcinoma  with very focal signet ring cell features CT paracentesis 04/24/2022-no malignant cells identified Colonoscopy 04/25/2022-diverticulosis in the sigmoid colon.  Internal hemorrhoids.  No specimens collected. Upper endoscopy 04/25/2022-large infiltrative mass with oozing bleeding and stigmata of recent bleeding in the gastric fundus, on the anterior wall of the gastric body and on the greater curvature of the gastric body; deformity in the gastric antrum.  Extrinsic deformity in the entire duodenum.  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 Her2 by IHC negative, 1+; MMR IHC normal; PD-L1 CPS 1%, positive. Claudin 18 positive, 100% Cycle 1 FOLFOX 05/04/2022 Cycle 2 FOLFOX 05/17/2022 Cycle 3 FOLFOX 05/31/2022-oxaliplatin  dose reduced secondary to prolonged cold sensitivity Cycle 4 FOLFOX 06/14/2022 Cycle 5 FOLFOX 06/28/2022 CT abdomen/pelvis 07/08/2022-asymmetric wall thickening at the lesser curvature of the stomach, decreased ascites, increased diffuse omental and peritoneal nodularity Cycle 6 FOLFOX 07/12/2022 Cycle 7 FOLFOX 08/09/2022 Cycle 8 FOLFOX 08/23/2022 Cycle 9 FOLFOX 09/06/2022 CTs 09/11/2022-stable, no new or progressive interval findings Cycle 10 FOLFOX 09/20/2022 Xeloda  maintenance 2 weeks on/1 week off 10/16/2022 Cycle 2 Xeloda  11/06/2022-Xeloda  dose reduced secondary to early foot skin toxicity Cycle 3 Xeloda  11/27/2022 Cycle 4 Xeloda  12/18/2022 CTs 01/02/2023-wall thickening of the mid gastric body-possibly progressive, unchanged peritoneal/omental caking, unchanged L1 compression fracture Cycle 5 Xeloda  beginning 01/08/2023 Cycle 6 Xeloda  beginning 01/29/2023 Cycle 7 Xeloda  beginning 02/19/2023 Cycle 8 Xeloda  beginning 03/12/2023 Cycle 9 Xeloda  beginning 04/09/2023 Cycle 10 Xeloda  beginning 05/07/2023 CTs 05/21/2023: Unchanged wall thickening of the gastric body, slight increase in small volume ascites, diffuse peritoneal thickening and stranding of the  omentum, no evidence of metastatic disease to  the chest Cycle 11 Xeloda  beginning 05/28/2023 Cycle 12 Xeloda  beginning 06/18/2023 Cycle 13 Xeloda  beginning 07/09/2023 Cycle 14 Xeloda  beginning 07/30/2023 CTA abdomen/pelvis 08/14/2023: Large volume of heterogenous material in the gastric lumen-food/blood, small volume ascites with diffuse peritoneal thickening and matting of the bowel/omentum, enhancement of the gastric body 08/18/2023: EGD-large mass in the gastric fundus/body with no bleeding, clean-based ulcers overlying 1 area of the mass, normal duodenum Cycle 15 Xeloda  09/03/2023 Cycle 16 Xeloda  09/24/2023 CTs 10/31/2023-moderate gastric body wall thickening similar.  Ill-definition of soft tissue planes in the lesser sac and porta hepatis new or progressive.  Development of intrahepatic duct dilatation.  Increase in small volume abdominal ascites with persistent peritoneal metastasis.  New trace left pleural fluid.  New T8 heterogeneous sclerosis and vertebral body height loss. Cycle 1 FOLFOX plus zolbetuximab beginning 11/14/2023 (zolbetuximab 11/14/2023, FOLFOX 11/15/2023) Cycle 2 FOLFOX plus zolbetuximab (zolbetuximab 11/28/23, FOLFOX anticipated 11/29/23) Cycle 3 FOLFOX plus zolbetuximab 12/12/2023 Cycle 4 FOLFOX plus zolbetuximab 12/27/2023 Truncal rash-status post recent punch biopsy with pathology pending; biopsy reported negative per family 04/26/2022 Lichen planus of the feet Hypothyroidism Atrial fibrillation Admission 07/24/2022 with acute bilateral pulmonary embolism/right heart strain Heparin  anticoagulation 07/24/2022 Doppler 07/24/2022-acute DVT of the right femoral, popliteal, posterior tibial, and peroneal veins, acute left posterior tibial DVT Lovenox  anticoagulation, changed to once daily dosing 05/25/2023 Lovenox  changed to 40 mg daily 08/30/2023 7.  Anemia secondary to chemotherapy, phlebotomy, and hemoptysis-improved 8.  L1 compression fracture on plain x-ray 12/18/2022 MRI lumbar spine  02/02/2023-acute/subacute L1 compression fracture, mild degenerative change of the lumbar spine 9.  Admission 08/14/2023 with hematemesis 10. C. Difficile positive by PCR-completed course of Vancomycin  11.  Admission 12/29/2023 with bilateral pulmonary embolism-heparin  12.  Diffuse pruritic rash-etiology?   Mr. Larouche is admitted with recurrent pulmonary embolism while maintained on Lovenox  anticoagulation.  I reviewed the chest CT images.  He resumed Lovenox  at a prophylactic dose on 08/30/2023 after admission with GI bleeding in early April 2025.  I discussed the difficult situation with Mr. Vineyard and his wife.  He has a history of recurrent pulmonary embolism in the setting of metastatic gastric cancer.  A gastric tumor remains in place and he was admitted with upper GI bleeding in early April. Treatment options include resuming full dose Lovenox  anticoagulation or changing to a different form of anticoagulation such as a DOAC or Coumadin.  We can consider IVC filter placement, but he would need anticoagulation with the filter in place.  He has completed 4 cycles of FOLFOX plus zolbetuximab.  His clinical status appears unchanged.  I  recommend obtaining a CT abdomen/pelvis to reassess the carcinomatosis.  Mr. Irizarry has advanced metastatic gastric cancer presenting with carcinomatosis in December 2023.  The goals of systemic therapy are to palliate symptoms and potentially extend survival.  No therapy will be curative.  We we will discuss options of continuing treatment versus hospice care after the restaging CT abdomen/pelvis.   The etiology of the pruritic rash is unclear.  The rash has the appearance of a drug rash.  I doubt the rash is related to chemotherapy as he has completed 3 cycles without development of a rash.  The rash appears improved today.  He received Decadron  yesterday.  Recommendations: Continue heparin  anticoagulation and discuss anticoagulation  options with Mr. Eckley and his  family Restaging CT abdomen/pelvis-I will order for 01/01/2024 Continue TPN Continue Decadron  Lower extremity Dopplers    LOS: 2 days   Arley Hof, MD   01/01/2024, 1:38 PM

## 2024-01-01 NOTE — TOC Progression Note (Signed)
 Transition of Care [facility]) - Progression Note    Patient Details  Name: Todd Harrison MRN: [id number] Date of Birth: [birth year]  [facility] CM/SW Contact  Dennisha Mouser, Nathanel, RN Phone Number: 01/01/2024, 2:44 PM  Clinical Narrative: palliative care meeting-currently continue w/goals of care-prolong life. Active w/[facility] TPN, HHRN.      Expected Discharge Plan: Home w Home Health Services Barriers to Discharge: Continued Medical Work up               Expected Discharge Plan and Services   Discharge Planning Services: CM Consult Post Acute Care Choice: Home Health Living arrangements for the past 2 months: Single Family Home                                       Social Drivers of Health (SDOH) Interventions SDOH Screenings   Food Insecurity: No Food Insecurity (12/30/2023)  Housing: Low Risk  (12/30/2023)  Transportation Needs: No Transportation Needs (12/30/2023)  Utilities: Not At Risk (12/30/2023)  Depression (PHQ2-9): Low Risk  (11/22/2023)  Financial Resource Strain: Low Risk  (04/17/2022)  Social Connections: Moderately Isolated (12/29/2023)  Stress: No Stress Concern Present (04/17/2022)  Tobacco Use: Medium Risk (12/29/2023)    Readmission Risk Interventions    12/31/2023   11:33 AM 07/28/2022    2:24 PM  Readmission Risk Prevention Plan  Transportation Screening Complete Complete  PCP or Specialist Appt within 5-7 Days  Complete  Home Care Screening  Complete  Medication Review (RN CM)  Complete  Medication Review (RN Care Manager) Complete   PCP or Specialist appointment within 3-5 days of discharge Complete   HRI or Home Care Consult Complete   SW Recovery Care/Counseling Consult Complete   Skilled Nursing Facility Not Applicable

## 2024-01-01 NOTE — Progress Notes (Signed)
 PHARMACY - TOTAL PARENTERAL NUTRITION CONSULT NOTE   Indication: chronic TPN  Patient Measurements: Height: 5' 11 (180.3 cm) Weight: 91.4 kg (201 lb 9.6 oz) IBW/kg (Calculated) : 75.3 TPN AdjBW (KG): 90.7 Body mass index is 28.12 kg/m. Usual Weight:   Assessment:  Patient is a 78yo M with PMH of metastatic gastric cancer undergoing chemotherapy on chronic TPN since July 2025 due to reduced po intake. Patient presented on 12/29/23 from [facility] for BL PE with no RHS. Pharmacy has been consulted to dose TPN while inpatient.   Glucose / Insulin : No hx of DM Not checking CBGs PTA - finger sticks & SSI stopped 8/25 per pt request 8/25 decadron  6 po qday x 2 days Serum glucose 116 on TPN @ 60 ml/hr Electrolytes:  phos up to 2.9 after K phos  neutral 500 mg po x 3 doses other lytes WNL including CoCa  Renal: Scr stable (BL 1-1.1), BUN WNL Hepatic: 8/25 AST slightly low, ALK phos elevated but downtrending Albumin low  Intake / Output; MIVF:  - UOP: 1180 ml - Stool: x 1 - PO intake: 0 ml -  no mIVF GI Imaging: CT 6/25 - L hepatic lobe lesion suspicion, moderate gastric wall thickening. No gastric outlet obstruction GI Surgeries / Procedures:   Central access: chest port present on admission TPN start date: in patient: 12/30/2023 On TPN PTA  Nutritional Goals: Goal TPN rate is 85 mL/hr (provides ~ 112 g of protein and ~ 2240 kcals per day) PTA on cyclic TPN over 18 hours provided by  [facility]  RD Assessment: Estimated Needs Total Energy Estimated Needs: 2200-2400 Total Protein Estimated Needs: 110-130 grams Total Fluid Estimated Needs: >/= 2 L  Current Nutrition:  Regular diet (none recorded) TPN @ 60 ml/hr Ensure High Protein DC'd 8/25 - pt refused all doses  Plan:  Today, at 1800 increase TPN to 85 mL/hr at 1800 to provide 100% of needs Plan to change to cyclic rate over 18 hours on 8/27 as on PTA Electrolytes in TPN: Na 20mEq/L, K 5mEq/L, Ca 49mEq/L, Mg 21mEq/L, and  Phos 15mmol/L. Cl:Ac 1:1 Add standard MVI and trace elements to TPN SSI & CBGs stopped 8/25 per pt request  Monitor TPN labs on Mon/Thurs, BMET, Mg & Phos in AM   Rosaline IVAR Edison, Pharm.D Use secure chat for questions 01/01/2024 7:46 AM

## 2024-01-01 NOTE — Progress Notes (Signed)
 PHARMACY - ANTICOAGULATION CONSULT NOTE  Pharmacy Consult for heparin  Indication: pulmonary embolus  Allergies  Allergen Reactions   Gold Itching   Mixed Grasses     unknown   Hydroxyquinolines Rash    Patient Measurements: Height: 5' 11 (180.3 cm) Weight: 91.4 kg (201 lb 9.6 oz) IBW/kg (Calculated) : 75.3 HEPARIN  DW (KG): 90.7  Vital Signs: Temp: 98 F (36.7 C) (08/26 0437) Temp Source: Oral (08/26 0437) BP: 97/66 (08/26 0437) Pulse Rate: 65 (08/26 0437)  Labs: Recent Labs    12/29/23 1544 12/29/23 2055 12/30/23 0152 12/30/23 0940 12/30/23 2012 12/31/23 1034 01/01/24 0523  HGB 10.5*   < > 8.9*  8.8* 8.8*  --  9.5* 8.6*  HCT 32.5*   < > 28.8*  29.2* 27.8*  --  31.0* 27.3*  PLT 107*  --  100*  --   --  116* 131*  LABPROT 14.4  --  16.7*  --   --   --   --   INR 1.1  --  1.3*  --   --   --   --   HEPARINUNFRC  --    < > 0.35 0.30 0.41 0.40 0.37  CREATININE 0.83  --  0.69  --   --  0.58* 0.59*   < > = values in this interval not displayed.    Estimated Creatinine Clearance: 92.2 mL/min (A) (by C-G formula based on SCr of 0.59 mg/dL (L)).  Assessment: 38 YOM presenting with dyspnea, hx gastric cancer, acute GIB in April 2025, anemia of chronic disease, and VTE on lovenox  PTA (DVT ppx dosing 40mg  daily), missed some doses recently including today (none today). CT angio chest revealed new PE with no right heart strain present. Pharmacy consulted to start heparin .   Today, 01/01/2024 HL 0.37 remains therapeutic with heparin  infusing at 1600 units/hr Hgb 8.6 (BL 9-12) PLTc low stable - 131 1 episode of cough with streaky hemoptysis on 8/25   Goal of Therapy:  Heparin  level 0.3-0.7 units/ml Monitor platelets by anticoagulation protocol: Yes   Plan:  -Continue heparin  infusion at 1600 units/hr -Daily heparin  level and CBC -F/u long term AC plan per Onc - if plan to change to lovenox  1 mg/kg sq q12 hours rec change to 80 mg sq q12 hrs instead of 90 q12 for  patient convenience of whole syringe size & bleeding risk   Rosaline IVAR Edison, Pharm.D Use secure chat for questions 01/01/2024 7:45 AM   Rosaline IVAR Edison, Pharm.D Use secure chat for questions 01/01/2024 7:41 AM

## 2024-01-01 NOTE — Progress Notes (Signed)
 PROGRESS NOTE    Todd Harrison  FMW:[id number] DOB: August 18, 1945 DOA: 12/29/2023 PCP: Henry Ingle, MD    Brief Narrative:  78 year old gentleman with history of metastatic gastric cancer currently on chemotherapy, paroxysmal A-fib, bilateral pulmonary embolism with concurrent DVT who was on prophylactic dose of Lovenox  at home 40 mg daily, previous upper GI bleeding and hemorrhagic shock requiring ICU management, chronic diastolic heart failure, anemia of chronic disease and chronic diarrhea admitted secondary to cough and hemoptysis, pleuritic chest pain.  Patient was found to have bilateral lower lobe PE without evidence of right heart strain.  Also with evidence of streaky hemoptysis. Patient has been started on heparin  infusion, has intermittent mild bleeding but no gross bleeding. Remains challenging to manage anticoagulation with high risk of rebleeding.  Followed by oncology and palliative care.  Subjective: Patient seen and examined in morning rounds.  Slight improvement after eating.  He had 2 episodes of streaky hemoptysis yesterday and none today.  Appetite is poor.  Itchiness has mostly improved however he still have to use a lot of hydroxyzine . Faces treatment dilemma.  Oncology planning for restaging CT scans tomorrow.  Assessment & Plan:   Acute pulmonary embolism without cor pulmonale, recurrent thromboembolism associated with malignant metastatic tumor.  Intolerance to anticoagulation:  Currently hemodynamically stable. Currently on heparin  and tolerating with streaky hemoptysis.  No evidence of gross  bleeding. Continue heparin .  Challenging to manage anticoagulation. Prophylactic dose of Lovenox  40 mg daily will not suffice to prevent malignancy related thromboembolism.  Patient will need Lovenox  1 mg/kgtwice daily to prevent thromboembolism from his metastatic cancer.  Hopefully we can challenge him with this in the hospital before discharge.  Remains on PPI  prophylaxis. On empiric Rocephin  and azithromycin .  Continue Rocephin  today.   Metastatic gastric cancer, high risk of bleeding.  Cough with hemoptysis: On PPI. Gastric malignancy with gastric mass, currently receiving chemotherapy with FOLFOX. Followed by oncology. Decadron , increase dose of hydroxyzine  for symptom relief and itching. Restaging CT scan tomorrow. Goal of care discussions ongoing.  Oncology following.  Chronic diastolic heart failure: Currently euvolemic.  GERD: On PPI as above.  Severe protein calorie malnutrition: Nutrition Status: Nutrition Problem: Severe Malnutrition Etiology: chronic illness, cancer and cancer related treatments Signs/Symptoms: moderate fat depletion, severe muscle depletion, percent weight loss Interventions: TPN, Refer to RD note for recommendations    Goal of care discussion: Ongoing goal of care discussion.  Patient and family aware about poor prognosis.  Will defer to oncology about recommending hospice care if appropriate for this patient.   Continue all supportive care.     DVT prophylaxis: Heparin  infusion   Code Status: DNR with intervention Family Communication: None today. Disposition Plan: Status is: Inpatient Remains inpatient appropriate because:  Significant symptoms, heparin  infusion   Consultants:  Oncology Palliative care  Procedures:  None  Antimicrobials:  Rocephin   8/23---     Objective: Vitals:   12/31/23 2139 01/01/24 0437 01/01/24 0548 01/01/24 1304  BP: 110/63 97/66  108/67  Pulse: 67 65  70  Resp: 18 16  20   Temp: 97.9 F (36.6 C) 98 F (36.7 C)  98.2 F (36.8 C)  TempSrc: Oral Oral  Oral  SpO2: 95% 96%  99%  Weight:   91.4 kg   Height:        Intake/Output Summary (Last 24 hours) at 01/01/2024 1427 Last data filed at 01/01/2024 0720 Gross per 24 hour  Intake 1535.62 ml  Output 1480 ml  Net 55.62 ml  Filed Weights   12/29/23 1513 01/01/24 0548  Weight: 90.7 kg 91.4 kg     Examination:  General exam: Chronically sick looking.  Frail and debilitated.  Able to have pleasant conversation today. Respiratory system: Clear to auscultation. Respiratory effort normal.  On room air.  No added sounds.  Cardiovascular system: S1 & S2 heard, RRR.  Gastrointestinal system: Soft.  Nontender.  Bowel sound present. Central nervous system: Alert and oriented. No focal neurological deficits.  Gross generalized weakness. Anasarca.  Multiple ecchymotic lesions on both upper extremities. Patient has rashes mostly on the flexor aspects of the thighs also on the trunk and abdomen. Port-A-Cath right chest wall.    Data Reviewed: I have personally reviewed following labs and imaging studies  CBC: Recent Labs  Lab 12/26/23 1328 12/26/23 1328 12/29/23 1544 12/29/23 2055 12/30/23 0152 12/30/23 0940 12/31/23 1034 01/01/24 0523  WBC 5.3  --  5.2  --  4.3  --  3.2* 2.8*  NEUTROABS 2.9  --  4.1  --  3.0  --  2.1 1.7  HGB 11.2*   < > 10.5* 9.8* 8.9*  8.8* 8.8* 9.5* 8.6*  HCT 35.4*  --  32.5* 32.9* 28.8*  29.2* 27.8* 31.0* 27.3*  MCV 90.8  --  89.8  --  92.9  --  94.5 92.2  PLT 156  --  107*  --  100*  --  116* 131*   < > = values in this interval not displayed.   Basic Metabolic Panel: Recent Labs  Lab 12/26/23 1328 12/29/23 0152 12/29/23 1544 12/30/23 0152 12/31/23 1034 01/01/24 0523  NA 140  --  137 138 135 138  K 3.9  --  3.8 3.7 3.6 3.8  CL 104  --  104 105 105 109  CO2 24  --  22 23 22 23   GLUCOSE 112*  --  110* 93 98 116*  BUN 22  --  23 19 11 12   CREATININE 0.77  --  0.83 0.69 0.58* 0.59*  CALCIUM  8.9  --  8.3* 7.8* 8.0* 7.9*  MG 2.2 2.0  --  2.1 1.9 2.1  PHOS 3.1  --   --  2.2* 1.9* 2.9   GFR: Estimated Creatinine Clearance: 92.2 mL/min (A) (by C-G formula based on SCr of 0.59 mg/dL (L)). Liver Function Tests: Recent Labs  Lab 12/26/23 1328 12/29/23 1544 12/30/23 0152 12/31/23 1034  AST 71* 20 12* 12*  ALT 61* 28 20 16   ALKPHOS 701* 388*  252* 210*  BILITOT 0.6 0.6 0.7 0.7  PROT 5.6* 5.3* 4.8* 5.0*  ALBUMIN 3.1* 2.8* 1.8* 2.0*   No results for input(s): LIPASE, AMYLASE in the last 168 hours. No results for input(s): AMMONIA in the last 168 hours. Coagulation Profile: Recent Labs  Lab 12/29/23 1544 12/30/23 0152  INR 1.1 1.3*   Cardiac Enzymes: No results for input(s): CKTOTAL, CKMB, CKMBINDEX, TROPONINI in the last 168 hours. BNP (last 3 results) Recent Labs    12/29/23 1544  PROBNP 550.0*   HbA1C: No results for input(s): HGBA1C in the last 72 hours. CBG: Recent Labs  Lab 12/30/23 1811 12/30/23 2028 12/30/23 2336 12/31/23 0616 12/31/23 1130  GLUCAP 84 106* 90 95 97   Lipid Profile: Recent Labs    12/30/23 0152 12/31/23 1034  TRIG 102 113   Thyroid  Function Tests: Recent Labs    12/30/23 0152  TSH 8.254*   Anemia Panel: Recent Labs    12/31/23 1034  VITAMINB12 368  FOLATE 39.3  FERRITIN 171  TIBC 237*  IRON 111   Sepsis Labs: Recent Labs  Lab 12/29/23 0152  PROCALCITON 0.29    Recent Results (from the past 240 hours)  Resp panel by RT-PCR (RSV, Flu A&B, Covid) Anterior Nasal Swab     Status: None   Collection Time: 12/29/23  3:16 PM   Specimen: Anterior Nasal Swab  Result Value Ref Range Status   SARS Coronavirus 2 by RT PCR NEGATIVE NEGATIVE Final    Comment: (NOTE) SARS-CoV-2 target nucleic acids are NOT DETECTED.  The SARS-CoV-2 RNA is generally detectable in upper respiratory specimens during the acute phase of infection. The lowest concentration of SARS-CoV-2 viral copies this assay can detect is 138 copies/mL. A negative result does not preclude SARS-Cov-2 infection and should not be used as the sole basis for treatment or other patient management decisions. A negative result may occur with  improper specimen collection/handling, submission of specimen other than nasopharyngeal swab, presence of viral mutation(s) within the areas targeted by this  assay, and inadequate number of viral copies(<138 copies/mL). A negative result must be combined with clinical observations, patient history, and epidemiological information. The expected result is Negative.  Fact Sheet for Patients:  BloggerCourse.com  Fact Sheet for Healthcare Providers:  SeriousBroker.it  This test is no t yet approved or cleared by the [place]  FDA and  has been authorized for detection and/or diagnosis of SARS-CoV-2 by FDA under an Emergency Use Authorization (EUA). This EUA will remain  in effect (meaning this test can be used) for the duration of the COVID-19 declaration under Section 564(b)(1) of the Act, 21 U.S.C.section 360bbb-3(b)(1), unless the authorization is terminated  or revoked sooner.       Influenza A by PCR NEGATIVE NEGATIVE Final   Influenza B by PCR NEGATIVE NEGATIVE Final    Comment: (NOTE) The Xpert Xpress SARS-CoV-2/FLU/RSV plus assay is intended as an aid in the diagnosis of influenza from Nasopharyngeal swab specimens and should not be used as a sole basis for treatment. Nasal washings and aspirates are unacceptable for Xpert Xpress SARS-CoV-2/FLU/RSV testing.  Fact Sheet for Patients: BloggerCourse.com  Fact Sheet for Healthcare Providers: SeriousBroker.it  This test is not yet approved or cleared by the [place]  FDA and has been authorized for detection and/or diagnosis of SARS-CoV-2 by FDA under an Emergency Use Authorization (EUA). This EUA will remain in effect (meaning this test can be used) for the duration of the COVID-19 declaration under Section 564(b)(1) of the Act, 21 U.S.C. section 360bbb-3(b)(1), unless the authorization is terminated or revoked.     Resp Syncytial Virus by PCR NEGATIVE NEGATIVE Final    Comment: (NOTE) Fact Sheet for Patients: BloggerCourse.com  Fact Sheet for  Healthcare Providers: SeriousBroker.it  This test is not yet approved or cleared by the [place]  FDA and has been authorized for detection and/or diagnosis of SARS-CoV-2 by FDA under an Emergency Use Authorization (EUA). This EUA will remain in effect (meaning this test can be used) for the duration of the COVID-19 declaration under Section 564(b)(1) of the Act, 21 U.S.C. section 360bbb-3(b)(1), unless the authorization is terminated or revoked.  Performed at [facility], [address] [place], [place] [address]          Radiology Studies: No results found.       Scheduled Meds:  Chlorhexidine  Gluconate Cloth  6 each Topical Daily   guaiFENesin   600 mg Oral BID   levothyroxine   112 mcg Oral Q0600   mineral  oil-hydrophilic petrolatum   Topical BID   pantoprazole  (PROTONIX ) IV  40 mg Intravenous Q12H   Continuous Infusions:  cefTRIAXone  (ROCEPHIN )  IV 1 g (12/31/23 2235)   heparin  1,600 Units/hr (01/01/24 1030)   TPN ADULT (ION) 60 mL/hr at 12/31/23 1825   TPN ADULT (ION)       LOS: 2 days    Time spent: 55 minutes    Renato Applebaum, MD [facility]

## 2024-01-01 NOTE — Progress Notes (Signed)
 Daily Progress Note   Patient Name: Todd Harrison       Date: 01/01/2024 DOB: [birth year]  Age: 78 y.o. MRN#: [id number] Attending Physician: Raenelle Coria, MD Primary Care Physician: Henry Ingle, MD Admit Date: 12/29/2023  Reason for Consultation/Follow-up: Establishing goals of care  Patient Profile/HPI:  78 y.o. male  with past medical history of stage IV gastric cancer with peritoneal mets currently on FOLFOX/zolbetuximab- last dose 12/27/23, a-fib, bilateral PE's with DVT- on Lovenox , hospitalization 08/2023 for GI hemorrhage requiring intubation, dHF, chronic diarrhea (on TPN), presented to ED on 12/29/2023 with hemoptysis.  CTA of chest indicated bilateral lower lobe PE- no heart strain. Started on heparin  drip. High risk for recurrent hemorrhage of gastric malignancy. Palliative consulted for discussion of options in the event that his clinical status declines. There is concern that there will be difficulty reaching adequate anticoagulation due to his continued hemoptysis and risk for GI hemorrhage.   Subjective: Chart reviewed including labs, progress notes, imaging from this and previous encounters.  CBC reviewed- noted some decrease in WBC 2.8 today from 3.2, Hgb down to 8.6- he has had a few episodes of hemoptysis today per his report.  He slept better last night and feels itching is improving. No complaints of pain or nausea today.  He and family feel they are in a waiting game. They understand Dr. Cloretta has ordered CT and LE dopplar today. We discussed possible plan to start lovenox  and this could influence further GOC discussions based on his tolerance and on CT scan results. Octaviano notes that his goals of care continue to be directed towards life prolonging interventions with limit  set at DNR.  We discussed role of outpatient Palliative in community and at [facility].  They are interested in completing a MOST form- I recommended that we wait to complete MOST form until closer to discharge and his trajectory may change conversation.   Review of Systems  Respiratory:  Positive for cough and hemoptysis.      Physical Exam Vitals and nursing note reviewed.  Constitutional:      General: He is not in acute distress. Cardiovascular:     Rate and Rhythm: Normal rate.  Pulmonary:     Effort: Pulmonary effort is normal.  Skin:    Comments: Scattered bruising on arms  Neurological:  Mental Status: He is alert and oriented to person, place, and time.             Vital Signs: BP 108/67 (BP Location: Right Arm)   Pulse 70   Temp 98.2 F (36.8 C) (Oral)   Resp 20   Ht 5' 11 (1.803 m)   Wt 91.4 kg   SpO2 99%   BMI 28.12 kg/m  SpO2: SpO2: 99 % O2 Device: O2 Device: Nasal Cannula O2 Flow Rate:    Intake/output summary:  Intake/Output Summary (Last 24 hours) at 01/01/2024 1420 Last data filed at 01/01/2024 0720 Gross per 24 hour  Intake 1535.62 ml  Output 1480 ml  Net 55.62 ml   LBM: Last BM Date : 01/01/24 Baseline Weight: Weight: 90.7 kg Most recent weight: Weight: 91.4 kg       Palliative Assessment/Data: PPS: 50%      Patient Active Problem List   Diagnosis Date Noted   Protein-calorie malnutrition, severe 01/01/2024   Chronic anticoagulation 12/30/2023   Goals of care, counseling/discussion 12/30/2023   Acute pulmonary embolism (HCC) 12/30/2023   Acute pulmonary embolism without acute cor pulmonale (HCC) 12/29/2023   Cough with hemoptysis 12/29/2023   Pressure injury of skin 12/29/2023   Chronic diarrhea 12/29/2023   Thrombocytopenia (HCC) 12/29/2023   Gastric cancer (HCC) 12/29/2023   Chronic diastolic CHF (congestive heart failure) (HCC) 12/29/2023   GERD (gastroesophageal reflux disease) 12/29/2023   Anemia of chronic disease  12/29/2023   Cervical disc disorder with radiculopathy of cervical region 11/08/2023   ABLA (acute blood loss anemia) 08/17/2023   Hemorrhagic shock (HCC) 08/15/2023   Hematemesis 08/14/2023   Acute upper GI bleed 08/14/2023   Malnutrition of moderate degree 07/25/2022   Hemoptysis 07/25/2022   Infarct of lung (HCC) 07/25/2022   Pulmonary embolus (HCC) 07/25/2022   Acute saddle pulmonary embolism with acute cor pulmonale (HCC) 07/24/2022   Paroxysmal atrial fibrillation (HCC) 07/24/2022   Lichen planus 07/24/2022   Malignant neoplasm of stomach (HCC) 04/26/2022   Aortic atherosclerosis (HCC) 04/17/2022   Abnormal CT of the chest 12/16/2020   COVID-19 virus infection 11/25/2020   OSA (obstructive sleep apnea) 05/19/2020   Persistent atrial fibrillation (HCC)    Daytime hypersomnolence 02/16/2020   Tachycardia 01/22/2020   Atrial fibrillation with RVR (HCC) 01/22/2020   Pneumonia 01/09/2020   Asthmatic bronchitis 10/20/2019   Methotrexate, long term, current use    Cough present for greater than 3 weeks 04/30/2019   Shortness of breath 04/30/2019   Complicated acute bronchitis 04/30/2019   Allergic contact dermatitis 09/09/2018   Acquired hypothyroidism 06/09/2016   Rotator cuff tear, right 09/22/2010   METATARSALGIA 11/24/2008   FOOT PAIN, LEFT 11/24/2008   TALIPES CAVUS 11/24/2008    Palliative Care Assessment & Plan    Assessment/Recommendations/Plan  Goals of care- continue current interventions- monitor for decompensation or complications with anticoagulation- PMT will continue to follow peripherally will follow up prn   Code Status:   Code Status: Do not attempt resuscitation (DNR) PRE-ARREST INTERVENTIONS DESIRED   Prognosis:  Unable to determine  Discharge Planning: To Be Determined  Care plan was discussed with patient, his family and care team  Thank you for allowing the Palliative Medicine Team to assist in the care of this patient.  Total time:  80  minutes Prolonged billing:  Time includes:   Preparing to see the patient (e.g., review of tests) Obtaining and/or reviewing separately obtained history Performing a medically necessary appropriate examination and/or evaluation Counseling and  educating the patient/family/caregiver Ordering medications, tests, or procedures Referring and communicating with other health care professionals (when not reported separately) Documenting clinical information in the electronic or other health record Independently interpreting results (not reported separately) and communicating results to the patient/family/caregiver Care coordination (not reported separately) Clinical documentation  Cassondra Stain, AGNP-C Palliative Medicine   Please contact Palliative Medicine Team phone at [phone] for questions and concerns.       SABRA

## 2024-01-01 NOTE — Progress Notes (Signed)
 Nutrition Follow-up  DOCUMENTATION CODES:   Severe malnutrition in context of chronic illness  INTERVENTION:   TPN to meet 100% of pt estimated needs  Management per pharmacy Daily weights while on TPN  Encourage PO diet as able and tolerated  NUTRITION DIAGNOSIS:   Severe Malnutrition related to chronic illness, cancer and cancer related treatments as evidenced by moderate fat depletion, severe muscle depletion, percent weight loss.  GOAL:   Patient will meet greater than or equal to 90% of their needs  Progressing with TPN  MONITOR:   Labs (TPN)  ASSESSMENT:   78 y.o. male presented to the ED with worsening cough and mild shortness of breath. PMH includes metastatic gastric cancer (currently on chemotherapy), home TPN with [facility], pulmonary embolism, GERD, CHF, and paroxysmal A. Fib. Pt admitted with bilateral pulmonary emboli.  Patient in room, no family at bedside. Pt's daughter reached out to this RD to have pt's Ensure supplement removed since pt is not consuming anything PO.  Per patient he doesn't want to eat anything as he has no taste. Has been undergoing chemotherapy for metastatic gastric cancer, last treatment 8/21. Describes most foods as having a cat food texture. At one point was drinking Boost at home but eventually this did not work for him either. States he did eat a bit of chicken one day recently and that was okay. Reports he thinks his taste is coming back but at this time doesn't want anything other than some water. Reviewed mouth rinsing before and after eating as well as sucking on sour candies or mints to help with taste.  TPN to advance to 85 ml/hr tonight, providing ~2239 kcals and 112g protein. Pharmacy plans to transition to cyclic 8/27.  Pt followed by [facility] for home TPN which cycles over 18 hours.   Pt reports at one time he weighed over 300 lbs. States lately he has been weighing ~220 lbs. Per weight records, pt's weight was maintained  ~245-256 lbs from September 2024 to April 2025. Since 08/30/23, pt has lost 44 lbs (17% wt loss x 4 months, significant for time frame). Current weight: 201 lbs  Medications reviewed.  Labs reviewed: TG 113  NUTRITION - FOCUSED PHYSICAL EXAM:  Flowsheet Row Most Recent Value  Orbital Region Severe depletion  Upper Arm Region Mild depletion  Thoracic and Lumbar Region No depletion  Buccal Region Moderate depletion  Temple Region Mild depletion  Clavicle Bone Region Severe depletion  Clavicle and Acromion Bone Region Severe depletion  Scapular Bone Region Severe depletion  Dorsal Hand Moderate depletion  [mild left]  Patellar Region No depletion  Anterior Thigh Region No depletion  Posterior Calf Region No depletion  Edema (RD Assessment) Mild  [LLE, generalized]  Hair Reviewed  Eyes Reviewed  Mouth Reviewed  [reports poor taste]  Skin Reviewed  [ecchymosis]  Nails Reviewed  [weak or no nails on hands and feet, h/o lichen planis]    Diet Order:   Diet Order             Diet regular Room service appropriate? Yes; Fluid consistency: Thin  Diet effective now                   EDUCATION NEEDS:   No education needs have been identified at this time  Skin:  Skin Assessment: Skin Integrity Issues: Skin Integrity Issues:: Stage II Stage II: sacrum  Last BM:  8/26 -type 6  Height:   Ht Readings from Last 1 Encounters:  12/29/23  5' 11 (1.803 m)    Weight:   Wt Readings from Last 1 Encounters:  01/01/24 91.4 kg    Ideal Body Weight:  78.2 kg  BMI:  Body mass index is 28.12 kg/m.  Estimated Nutritional Needs:   Kcal:  2200-2400  Protein:  110-130 grams  Fluid:  >/= 2 L   Morna Lee, MS, RD, LDN Inpatient Clinical Dietitian Contact via Secure chat

## 2024-01-01 NOTE — Progress Notes (Signed)
 BLE venous exam is completed. Bronda Alfred, RVT

## 2024-01-02 DIAGNOSIS — D638 Anemia in other chronic diseases classified elsewhere: Secondary | ICD-10-CM | POA: Diagnosis not present

## 2024-01-02 DIAGNOSIS — C169 Malignant neoplasm of stomach, unspecified: Secondary | ICD-10-CM | POA: Diagnosis not present

## 2024-01-02 DIAGNOSIS — I2699 Other pulmonary embolism without acute cor pulmonale: Secondary | ICD-10-CM | POA: Diagnosis not present

## 2024-01-02 DIAGNOSIS — Z7901 Long term (current) use of anticoagulants: Secondary | ICD-10-CM | POA: Diagnosis not present

## 2024-01-02 LAB — BASIC METABOLIC PANEL WITH GFR
Anion gap: 10 (ref 5–15)
BUN: 13 mg/dL (ref 8–23)
CO2: 19 mmol/L — ABNORMAL LOW (ref 22–32)
Calcium: 8 mg/dL — ABNORMAL LOW (ref 8.9–10.3)
Chloride: 108 mmol/L (ref 98–111)
Creatinine, Ser: 0.57 mg/dL — ABNORMAL LOW (ref 0.61–1.24)
GFR, Estimated: >60 mL/min (ref >60)
Glucose, Bld: 106 mg/dL — ABNORMAL HIGH (ref 70–99)
Potassium: 3.9 mmol/L (ref 3.5–5.1)
Sodium: 137 mmol/L (ref 135–145)

## 2024-01-02 LAB — CBC WITH DIFFERENTIAL/PLATELET
Abs Immature Granulocytes: 0.03 K/uL (ref 0.00–0.07)
Basophils Absolute: 0 K/uL (ref 0.0–0.1)
Basophils Relative: 1 %
Eosinophils Absolute: 0.2 K/uL (ref 0.0–0.5)
Eosinophils Relative: 8 %
HCT: 28 % — ABNORMAL LOW (ref 39.0–52.0)
Hemoglobin: 8.7 g/dL — ABNORMAL LOW (ref 13.0–17.0)
Immature Granulocytes: 1 %
Lymphocytes Relative: 42 %
Lymphs Abs: 1.3 K/uL (ref 0.7–4.0)
MCH: 28.8 pg (ref 26.0–34.0)
MCHC: 31.1 g/dL (ref 30.0–36.0)
MCV: 92.7 fL (ref 80.0–100.0)
Monocytes Absolute: 0.1 K/uL (ref 0.1–1.0)
Monocytes Relative: 5 %
Neutro Abs: 1.3 K/uL — ABNORMAL LOW (ref 1.7–7.7)
Neutrophils Relative %: 43 %
Platelets: 143 K/uL — ABNORMAL LOW (ref 150–400)
RBC: 3.02 MIL/uL — ABNORMAL LOW (ref 4.22–5.81)
RDW: 19.2 % — ABNORMAL HIGH (ref 11.5–15.5)
WBC: 3 K/uL — ABNORMAL LOW (ref 4.0–10.5)
nRBC: 0 % (ref 0.0–0.2)

## 2024-01-02 LAB — HEPARIN LEVEL (UNFRACTIONATED): Heparin Unfractionated: 0.3 [IU]/mL (ref 0.30–0.70)

## 2024-01-02 LAB — PHOSPHORUS: Phosphorus: 2.1 mg/dL — ABNORMAL LOW (ref 2.5–4.6)

## 2024-01-02 LAB — MAGNESIUM: Magnesium: 2.2 mg/dL (ref 1.7–2.4)

## 2024-01-02 MED ORDER — TRAVASOL 10 % IV SOLN
INTRAVENOUS | Status: AC
  Filled 2024-01-02: qty 1122

## 2024-01-02 MED ORDER — BENZONATATE 100 MG PO CAPS
200.0000 mg | ORAL_CAPSULE | Freq: Three times a day (TID) | ORAL | Status: DC
  Administered 2024-01-02 – 2024-01-04 (×6): 200 mg via ORAL
  Filled 2024-01-02 (×6): qty 2

## 2024-01-02 MED ORDER — K PHOS MONO-SOD PHOS DI & MONO 155-852-130 MG PO TABS
500.0000 mg | ORAL_TABLET | ORAL | Status: AC
  Administered 2024-01-02 (×3): 500 mg via ORAL
  Filled 2024-01-02 (×3): qty 2

## 2024-01-02 NOTE — Plan of Care (Signed)

## 2024-01-02 NOTE — Progress Notes (Addendum)
 PHARMACY - ANTICOAGULATION CONSULT NOTE  Pharmacy Consult for heparin  Indication: pulmonary embolus  Allergies  Allergen Reactions   Gold Itching   Mixed Grasses     unknown   Hydroxyquinolines Rash    Patient Measurements: Height: 5' 11 (180.3 cm) Weight: 91.4 kg (201 lb 9.6 oz) IBW/kg (Calculated) : 75.3 HEPARIN  DW (KG): 90.7  Vital Signs: Temp: 97.8 F (36.6 C) (08/27 0505) Temp Source: Oral (08/27 0505) BP: 105/65 (08/27 0505) Pulse Rate: 66 (08/27 0505)  Labs: Recent Labs    12/31/23 1034 01/01/24 0523 01/02/24 0533  HGB 9.5* 8.6* 8.7*  HCT 31.0* 27.3* 28.0*  PLT 116* 131* 143*  HEPARINUNFRC 0.40 0.37 0.30  CREATININE 0.58* 0.59*  --     Estimated Creatinine Clearance: 92.2 mL/min (A) (by C-G formula based on SCr of 0.59 mg/dL (L)).  Assessment: 89 YOM presenting with dyspnea, hx gastric cancer, acute GIB in April 2025, anemia of chronic disease, and VTE on lovenox  PTA (DVT ppx dosing 40mg  daily), missed some doses recently including today (none today). CT angio chest revealed new PE with no right heart strain present. Pharmacy consulted to start heparin .   Today, 01/02/2024 HL 0.30 remains therapeutic with heparin  infusing at 1600 units/hr Hgb 8.7 (BL 9-12) PLTc remains low - 143 8/26 Doppler: acute R DVT and chronic R DVT Few episodes of hemoptysis on 8/26 per pt report to [facility] but per [facility] note had 2 episodes hemoptysis 8/25 & none 8/26  Goal of Therapy:  Heparin  level 0.3-0.7 units/ml Monitor platelets by anticoagulation protocol: Yes   Plan:  -Continue heparin  infusion at 1600 units/hr -Daily heparin  level and CBC -F/u long term AC plan per Onc - if plan to change to lovenox  1 mg/kg sq q12 hours rec change to 80 mg sq q12 hrs instead of 90 q12 for patient convenience of whole syringe size & bleeding risk  Rosaline IVAR Edison, Pharm.D Use secure chat for questions 01/02/2024 7:18 AM

## 2024-01-02 NOTE — Progress Notes (Signed)
 IP PROGRESS NOTE  Subjective:   Todd Harrison reports continued hemoptysis.  Pruritus is controlled with hydroxyzine .  No new complaint.  Objective: Vital signs in last 24 hours: Blood pressure 105/65, pulse 66, temperature 97.8 F (36.6 C), temperature source Oral, resp. rate 20, height 5' 11 (1.803 m), weight 201 lb 9.6 oz (91.4 kg), SpO2 96%.  Intake/Output from previous day: 08/26 0701 - 08/27 0700 In: 1079.6 [I.V.:979.6; IV Piggyback:100] Out: 1000 [Urine:1000]  Physical Exam:  HEENT: No thrush or ulcers Lungs: Decreased breath sounds at the lower posterior chest bilaterally, inspiratory rhonchi at the right rater than left posterior base, no respiratory distress Cardiac: Regular rate and rhythm Abdomen: No hepatosplenomegaly, nondistended, no mass Extremities: No leg edema Skin: Erythematous rash over the trunk, arms, and legs is improved multiple ecchymoses over the arms   Portacath/PICC-without erythema  Lab Results: Recent Labs    01/01/24 0523 01/02/24 0533  WBC 2.8* 3.0*  HGB 8.6* 8.7*  HCT 27.3* 28.0*  PLT 131* 143*    BMET Recent Labs    12/31/23 1034 01/01/24 0523  NA 135 138  K 3.6 3.8  CL 105 109  CO2 22 23  GLUCOSE 98 116*  BUN 11 12  CREATININE 0.58* 0.59*  CALCIUM  8.0* 7.9*    Lab Results  Component Value Date   CEA1 <0.6 04/13/2022   CEA 2.32 11/14/2023    Studies/Results: VAS US  LOWER EXTREMITY VENOUS (DVT) Result Date: 01/01/2024  Lower Venous DVT Study Patient Name:  Todd Harrison  Date of Exam:   01/01/2024 Medical Rec #: [id number]        Accession #:    [id number] Date of Birth: [birth year]       Patient Gender: M Patient Age:   78 years Exam Location:  [facility] Procedure:      VAS US  LOWER EXTREMITY VENOUS (DVT) Referring Phys: ARLEY HOF --------------------------------------------------------------------------------  Indications: Pulmonary embolism.  Comparison Study: 07/25/2022 Rt Femoral vein DVT and Lt PTV DVT  Performing Technologist: Elmarie Lindau, RVT  Examination Guidelines: A complete evaluation includes B-mode imaging, spectral Doppler, color Doppler, and power Doppler as needed of all accessible portions of each vessel. Bilateral testing is considered an integral part of a complete examination. Limited examinations for reoccurring indications may be performed as noted. The reflux portion of the exam is performed with the patient in reverse Trendelenburg.  +---------+---------------+---------+-----------+----------+--------------+ RIGHT    CompressibilityPhasicitySpontaneityPropertiesThrombus Aging +---------+---------------+---------+-----------+----------+--------------+ CFV      Partial                                      Acute          +---------+---------------+---------+-----------+----------+--------------+ SFJ      Full                                                        +---------+---------------+---------+-----------+----------+--------------+ FV Prox  Full                                                        +---------+---------------+---------+-----------+----------+--------------+ FV Mid  Partial                                      Chronic        +---------+---------------+---------+-----------+----------+--------------+ FV DistalFull                                                        +---------+---------------+---------+-----------+----------+--------------+ PFV      Full                                                        +---------+---------------+---------+-----------+----------+--------------+ POP      None                                         Acute          +---------+---------------+---------+-----------+----------+--------------+ PTV      Full                                                        +---------+---------------+---------+-----------+----------+--------------+ PERO     Full                                                         +---------+---------------+---------+-----------+----------+--------------+   +---------+---------------+---------+-----------+----------+--------------+ LEFT     CompressibilityPhasicitySpontaneityPropertiesThrombus Aging +---------+---------------+---------+-----------+----------+--------------+ CFV      Full           Yes      Yes                                 +---------+---------------+---------+-----------+----------+--------------+ SFJ      Full                                                        +---------+---------------+---------+-----------+----------+--------------+ FV Prox  Full                                                        +---------+---------------+---------+-----------+----------+--------------+ FV Mid   Full                                                        +---------+---------------+---------+-----------+----------+--------------+  FV DistalFull                                                        +---------+---------------+---------+-----------+----------+--------------+ PFV      Full                                                        +---------+---------------+---------+-----------+----------+--------------+ POP      Full           Yes      Yes                                 +---------+---------------+---------+-----------+----------+--------------+ PTV      Full                                                        +---------+---------------+---------+-----------+----------+--------------+ PERO     Full                                                        +---------+---------------+---------+-----------+----------+--------------+     Summary: RIGHT: - Findings consistent with acute deep vein thrombosis involving the right common femoral vein, and right popliteal vein.  - Findings consistent with chronic deep vein thrombosis involving the right femoral vein.  - No cystic  structure found in the popliteal fossa.  LEFT: - There is no evidence of deep vein thrombosis in the lower extremity.  - No cystic structure found in the popliteal fossa.  *See table(s) above for measurements and observations. Electronically signed by Gaile New MD on 01/01/2024 at 7:18:13 PM.    Final      Medications: I have reviewed the patient's current medications.  Assessment/Plan:  Gastric cancer with CT evidence of abdominal carcinomatosis CT abdomen/pelvis 04/13/2022-ascites, diffuse omental and peritoneal nodularity, possible circumferential mass in the transverse colon, new small left greater than right pleural effusions CT abdominal mass biopsy 04/24/2022-metastatic poorly differentiated adenocarcinoma with very focal signet ring cell features CT paracentesis 04/24/2022-no malignant cells identified Colonoscopy 04/25/2022-diverticulosis in the sigmoid colon.  Internal hemorrhoids.  No specimens collected. Upper endoscopy 04/25/2022-large infiltrative mass with oozing bleeding and stigmata of recent bleeding in the gastric fundus, on the anterior wall of the gastric body and on the greater curvature of the gastric body; deformity in the gastric antrum.  Extrinsic deformity in the entire duodenum.  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 Her2 by IHC negative, 1+; MMR IHC normal; PD-L1 CPS 1%, positive. Claudin 18 positive, 100% Cycle 1 FOLFOX 05/04/2022 Cycle 2 FOLFOX 05/17/2022 Cycle 3 FOLFOX 05/31/2022-oxaliplatin  dose reduced secondary to prolonged cold sensitivity Cycle 4 FOLFOX 06/14/2022 Cycle 5 FOLFOX 06/28/2022 CT abdomen/pelvis 07/08/2022-asymmetric wall thickening at the lesser curvature of the stomach, decreased ascites, increased diffuse omental and peritoneal  nodularity Cycle 6 FOLFOX 07/12/2022 Cycle 7 FOLFOX 08/09/2022 Cycle 8 FOLFOX 08/23/2022 Cycle 9 FOLFOX 09/06/2022 CTs 09/11/2022-stable, no new or progressive interval  findings Cycle 10 FOLFOX 09/20/2022 Xeloda  maintenance 2 weeks on/1 week off 10/16/2022 Cycle 2 Xeloda  11/06/2022-Xeloda  dose reduced secondary to early foot skin toxicity Cycle 3 Xeloda  11/27/2022 Cycle 4 Xeloda  12/18/2022 CTs 01/02/2023-wall thickening of the mid gastric body-possibly progressive, unchanged peritoneal/omental caking, unchanged L1 compression fracture Cycle 5 Xeloda  beginning 01/08/2023 Cycle 6 Xeloda  beginning 01/29/2023 Cycle 7 Xeloda  beginning 02/19/2023 Cycle 8 Xeloda  beginning 03/12/2023 Cycle 9 Xeloda  beginning 04/09/2023 Cycle 10 Xeloda  beginning 05/07/2023 CTs 05/21/2023: Unchanged wall thickening of the gastric body, slight increase in small volume ascites, diffuse peritoneal thickening and stranding of the omentum, no evidence of metastatic disease to the chest Cycle 11 Xeloda  beginning 05/28/2023 Cycle 12 Xeloda  beginning 06/18/2023 Cycle 13 Xeloda  beginning 07/09/2023 Cycle 14 Xeloda  beginning 07/30/2023 CTA abdomen/pelvis 08/14/2023: Large volume of heterogenous material in the gastric lumen-food/blood, small volume ascites with diffuse peritoneal thickening and matting of the bowel/omentum, enhancement of the gastric body 08/18/2023: EGD-large mass in the gastric fundus/body with no bleeding, clean-based ulcers overlying 1 area of the mass, normal duodenum Cycle 15 Xeloda  09/03/2023 Cycle 16 Xeloda  09/24/2023 CTs 10/31/2023-moderate gastric body wall thickening similar.  Ill-definition of soft tissue planes in the lesser sac and porta hepatis new or progressive.  Development of intrahepatic duct dilatation.  Increase in small volume abdominal ascites with persistent peritoneal metastasis.  New trace left pleural fluid.  New T8 heterogeneous sclerosis and vertebral body height loss. Cycle 1 FOLFOX plus zolbetuximab beginning 11/14/2023 (zolbetuximab 11/14/2023, FOLFOX 11/15/2023) Cycle 2 FOLFOX plus zolbetuximab (zolbetuximab 11/28/23, FOLFOX anticipated 11/29/23) Cycle 3 FOLFOX plus  zolbetuximab 12/12/2023 Cycle 4 FOLFOX plus zolbetuximab 12/27/2023 CT abdomen/pelvis 01/01/2024: Improved lower lobe pulmonary embolism, stable gastric wall thickening, infiltrative density in the lesser sac region, tumor caking along the omentum and mesentery-stable, reduced anterior ascites stable 1.1 cm segment 4 liver lesion Truncal rash-status post recent punch biopsy with pathology pending; biopsy reported negative per family 04/26/2022 Lichen planus of the feet Hypothyroidism Atrial fibrillation Admission 07/24/2022 with acute bilateral pulmonary embolism/right heart strain Heparin  anticoagulation 07/24/2022 Doppler 07/24/2022-acute DVT of the right femoral, popliteal, posterior tibial, and peroneal veins, acute left posterior tibial DVT Lovenox  anticoagulation, changed to once daily dosing 05/25/2023 Lovenox  changed to 40 mg daily 08/30/2023 7.  Anemia secondary to chemotherapy, phlebotomy, and hemoptysis-improved 8.  L1 compression fracture on plain x-ray 12/18/2022 MRI lumbar spine 02/02/2023-acute/subacute L1 compression fracture, mild degenerative change of the lumbar spine 9.  Admission 08/14/2023 with hematemesis 10. C. Difficile positive by PCR-completed course of Vancomycin  11.  Admission 12/29/2023 with bilateral pulmonary embolism-heparin  Dopplers 01/01/2024: Acute right common femoral and popliteal DVT 12.  Diffuse pruritic rash-etiology?   Todd Harrison is admitted with recurrent pulmonary embolism while maintained on Lovenox  anticoagulation.  I reviewed the chest CT images.  He resumed Lovenox  at a prophylactic dose on 08/30/2023 after admission with GI bleeding in early April 2025.  He has been maintained on heparin  anticoagulation since hospital admission.  Pulmonary emboli were improved on the CT abdomen/pelvis yesterday.   I discussed the CT findings with Todd Harrison and his wife this morning.  The final report was not available.  The ascites appears improved.  No evidence of disease  progression.  He has completed 4 cycles of FOLFOX plus zolbetuximab.  His clinical status appears unchanged.SABRA  He continues to discussions with his family and palliative care regarding the  indication to continue chemotherapy versus comfort care.  I think it is reasonable to continue FOLFOX/zolbetuximab in the absence of disease progression.  Todd Harrison has advanced metastatic gastric cancer presenting with carcinomatosis in December 2023.  The goals of systemic therapy are to palliate symptoms and potentially extend survival.  No therapy will be curative.    The rash appears improved.  I doubt the rash is related to chemotherapy as he completed 3 cycles without development of a rash.    We discussed the difficult decision on anticoagulation therapy.  He understands the risk of bleeding and recurrent thrombosis.  We discussed starting a DOAC versus continuing Lovenox .  I recommend resuming the Lovenox  at a higher dose with twice daily dosing.  He agrees.  Recommendations: Continue heparin  anticoagulation, plan to transition to Lovenox  tomorrow Continue discussions with Todd Harrison regarding continuation of FOLFOX/zolbetuximab versus hospice Continue TPN     LOS: 3 days   Arley Hof, MD   01/02/2024, 6:43 AM

## 2024-01-02 NOTE — Evaluation (Signed)
 Physical Therapy Evaluation Patient Details Name: Todd Harrison MRN: [id number] DOB: December 02, 1945 Today's Date: 01/02/2024  History of Present Illness  78 year old gentleman admitted secondary to cough and hemoptysis, pleuritic chest pain. Patient was found to have bilateral lower lobe PE without evidence of right heart strain. started on heparin  infusion, has intermittent mild bleeding but no gross bleeding.  Followed by oncology and   with history of metastatic gastric cancer currently on chemotherapy, paroxysmal A-fib, bilateral pulmonary embolism with concurrent DVT who was on prophylactic dose of Lovenox  at home 40 mg daily, previous upper GI bleeding and hemorrhagic shock requiring ICU management, chronic diastolic heart failure, anemia of chronic disease and chronic diarrhea  Clinical Impression  Pt admitted with above diagnosis.  Pt ind at baseline.  Amb 180' with RW, CGA for safety.  HR 90, SPO2=100% on RA, RR 24 post activity.  Anticipate pt will progress with mobility during acute setting, likely will not need f/u PT; may benefit from rollator at d/c.  Pt currently with functional limitations due to the deficits listed below (see PT Problem List). Pt will benefit from acute skilled PT to increase their independence and safety with mobility to allow discharge.           If plan is discharge home, recommend the following: Assist for transportation;Help with stairs or ramp for entrance;Assistance with cooking/housework   Can travel by private vehicle        Equipment Recommendations Other (comment) (rollator -?TBA)  Recommendations for Other Services       Functional Status Assessment Patient has had a recent decline in their functional status and demonstrates the ability to make significant improvements in function in a reasonable and predictable amount of time.     Precautions / Restrictions Precautions Precautions: Fall Restrictions Weight Bearing Restrictions Per Provider  Order: No      Mobility  Bed Mobility Overal bed mobility: Modified Independent                  Transfers Overall transfer level: Needs assistance Equipment used: Rolling walker (2 wheels) Transfers: Sit to/from Stand Sit to Stand: Supervision           General transfer comment: for safety    Ambulation/Gait Ambulation/Gait assistance: Contact guard assist Gait Distance (Feet): 180 Feet Assistive device: Rolling walker (2 wheels) Gait Pattern/deviations: Step-through pattern, Wide base of support       General Gait Details: CGA for safety. HR 90, SPO2=100% on RA, RR 20s to 30  Stairs            Wheelchair Mobility     Tilt Bed    Modified Rankin (Stroke Patients Only)       Balance Overall balance assessment: Needs assistance, History of Falls Sitting-balance support: Feet supported, No upper extremity supported Sitting balance-Leahy Scale: Good     Standing balance support: No upper extremity supported, During functional activity, Reliant on assistive device for balance Standing balance-Leahy Scale: Fair                               Pertinent Vitals/Pain Pain Assessment Pain Assessment: No/denies pain    Home Living Family/patient expects to be discharged to:: Private residence Living Arrangements: Spouse/significant other Available Help at Discharge: Family;Available 24 hours/day Type of Home: House Home Access: Stairs to enter Entrance Stairs-Rails: None Entrance Stairs-Number of Steps: 2   Home Layout: One level Home Equipment: None Additional Comments:  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[employer], television/film/video Prior Level of Function : Independent/Modified Independent                     Extremity/Trunk Assessment   Upper Extremity Assessment Upper Extremity Assessment: Defer to OT evaluation    Lower Extremity Assessment Lower Extremity Assessment: Overall WFL for tasks assessed    Cervical / Trunk  Assessment Cervical / Trunk Assessment: Other exceptions Cervical / Trunk Exceptions: fwd head posture  Communication   Communication Communication: No apparent difficulties    Cognition Arousal: Alert Behavior During Therapy: WFL for tasks assessed/performed   PT - Cognitive impairments: No apparent impairments                         Following commands: Intact       Cueing       General Comments      Exercises Other Exercises Other Exercises: STS x5   Assessment/Plan    PT Assessment Patient needs continued PT services  PT Problem List Decreased activity tolerance;Decreased balance;Decreased knowledge of use of DME;Decreased mobility       PT Treatment Interventions DME instruction;Therapeutic exercise;Gait training;Functional mobility training;Therapeutic activities;Patient/family education    PT Goals (Current goals can be found in the Care Plan section)  Acute Rehab PT Goals PT Goal Formulation: With patient Time For Goal Achievement: 01/16/24 Potential to Achieve Goals: Good    Frequency Min 2X/week     Co-evaluation               AM-PAC PT 6 Clicks Mobility  Outcome Measure Help needed turning from your back to your side while in a flat bed without using bedrails?: A Little Help needed moving from lying on your back to sitting on the side of a flat bed without using bedrails?: A Little Help needed moving to and from a bed to a chair (including a wheelchair)?: A Little Help needed standing up from a chair using your arms (e.g., wheelchair or bedside chair)?: A Little Help needed to walk in hospital room?: A Little Help needed climbing 3-5 steps with a railing? : A Little 6 Click Score: 18    End of Session Equipment Utilized During Treatment: Gait belt Activity Tolerance: Patient tolerated treatment well Patient left: with call bell/phone within reach;in chair;with family/visitor present Nurse Communication: Mobility status PT  Visit Diagnosis: Other abnormalities of gait and mobility (R26.89)    Time: 8383-8352 PT Time Calculation (min) (ACUTE ONLY): 31 min   Charges:   PT Evaluation $PT Eval Low Complexity: 1 Low PT Treatments $Gait Training: 8-22 mins PT General Charges $$ ACUTE PT VISIT: 1 Visit         Zayden Hahne, PT  Acute Rehab Dept ([facility]) [phone]  01/02/2024   [facility] 01/02/2024, 5:00 PM

## 2024-01-02 NOTE — Progress Notes (Signed)
 PHARMACY - TOTAL PARENTERAL NUTRITION CONSULT NOTE   Indication: chronic TPN  Patient Measurements: Height: 5' 11 (180.3 cm) Weight: 91.4 kg (201 lb 9.6 oz) IBW/kg (Calculated) : 75.3 TPN AdjBW (KG): 90.7 Body mass index is 28.12 kg/m. Usual Weight:   Assessment:  Patient is a 78yo M with PMH of metastatic gastric cancer undergoing chemotherapy on chronic TPN since July 2025 due to reduced po intake. Patient presented on 12/29/23 from [facility] for BL PE with no RHS. Pharmacy has been consulted to dose TPN while inpatient.   Glucose / Insulin : No hx of DM Not checking CBGs PTA - finger sticks & SSI stopped 8/25 per pt request 8/25 decadron  6 po qday x 2 days completed Serum glucose 106 on TPN @ 85 ml/hr Electrolytes: phos down to 2.1; other lytes WNL including CoCa  Renal: Scr stable (BL 1-1.1), BUN WNL Hepatic: 8/25 AST slightly low, ALK phos elevated but downtrending Albumin low  Intake / Output; MIVF:  - UOP: 1300 ml - Stool: x 2  - PO intake: 0 ml -  no mIVF GI Imaging: CT 6/25 - L hepatic lobe lesion suspicion, moderate gastric wall thickening. No gastric outlet obstruction GI Surgeries / Procedures:   Central access: chest port present on admission TPN start date: in patient: 12/30/2023 On TPN PTA  Nutritional Goals: Goal TPN rate is 85 mL/hr (provides ~ 112 g of protein and ~ 2240 kcals per day) PTA on cyclic TPN over 18 hours provided by  [facility]  RD Assessment: Estimated Needs Total Energy Estimated Needs: 2200-2400 Total Protein Estimated Needs: 110-130 grams Total Fluid Estimated Needs: >/= 2 L  Current Nutrition:  Regular diet ordered but not eating d/t no taste TPN @ 28ml/hr Ensure High Protein DC'd 8/25 - pt refused all doses  Plan:  Now: K phos  neutral 500 mg po q 4hrs x 3 doses Today, at 1800 change TPN to cyclic rate over 18 hours  TPN provides 100 % of estimated needs Electrolytes in TPN: Na 54mEq/L, K 60mEq/L, Ca 47mEq/L, Mg 8mEq/L, and  increase Phos to 20 mmol/L. Cl:Ac 1:1 Add standard MVI and trace elements to TPN SSI & CBGs stopped 8/25 per pt request  Monitor TPN labs on Mon/Thurs  Rosaline IVAR Edison, Pharm.D Use secure chat for questions 01/02/2024 7:32 AM

## 2024-01-02 NOTE — Progress Notes (Signed)
 PROGRESS NOTE    Todd Harrison  FMW:[id number] DOB: January 29, 1946 DOA: 12/29/2023 PCP: Henry Ingle, MD    Brief Narrative:  78 year old gentleman with history of metastatic gastric cancer currently on chemotherapy, paroxysmal A-fib, bilateral pulmonary embolism with concurrent DVT who was on prophylactic dose of Lovenox  at home 40 mg daily, previous upper GI bleeding and hemorrhagic shock requiring ICU management, chronic diastolic heart failure, anemia of chronic disease and chronic diarrhea admitted secondary to cough and hemoptysis, pleuritic chest pain.  Patient was found to have bilateral lower lobe PE without evidence of right heart strain.  Also with evidence of streaky hemoptysis. Patient has been started on heparin  infusion, has intermittent mild bleeding but no gross bleeding. Remains challenging to manage anticoagulation with high risk of rebleeding.  Followed by oncology and palliative care.  Subjective: Patient seen and examined.  Denies any complaints.  He is eager to get out of the bed and mobilize today.  Does not have any taste for the food.  Wife at the bedside.  Still has some streaky hemoptysis.  Itchiness persist however does respond to hydroxyzine .  Assessment & Plan:   Acute pulmonary embolism without cor pulmonale, recurrent thromboembolism associated with malignant metastatic tumor.  Intolerance to anticoagulation:  Currently hemodynamically stable. Currently on heparin  and tolerating with streaky hemoptysis.  No evidence of gross  bleeding. Continue heparin .  Challenging to manage anticoagulation. Prophylactic dose of Lovenox  40 mg daily will not suffice to prevent malignancy related thromboembolism.   As per oncology plan, keeping on heparin  today.  Plan is to challenge him with Lovenox  tomorrow and monitor in the hospital.  Remains on PPI prophylaxis.  Will start patient on around-the-clock Tessalon  Perles to suppress cough.  Metastatic gastric cancer, high  risk of bleeding.  Cough with hemoptysis: On PPI. Gastric malignancy with gastric mass, currently receiving chemotherapy with FOLFOX. Followed by oncology. Decadron , increase dose of hydroxyzine  for symptom relief and itching. Restaging CT scan done.  Results pending. Goal of care discussions ongoing.  Oncology following.  Chronic diastolic heart failure: Currently euvolemic.  GERD: On PPI as above.  Severe protein calorie malnutrition: Nutrition Status: Nutrition Problem: Severe Malnutrition Etiology: chronic illness, cancer and cancer related treatments Signs/Symptoms: moderate fat depletion, severe muscle depletion, percent weight loss Interventions: TPN, Refer to RD note for recommendations    Goal of care discussion: Ongoing goal of care discussion.  Patient and family aware about poor prognosis.   Patient wants to continue treatment for now.  He wants to accept all interventions.  DVT prophylaxis: Heparin  infusion   Code Status: DNR with intervention Family Communication: Wife at the bedside. Disposition Plan: Status is: Inpatient Remains inpatient appropriate because:  Significant symptoms, heparin  infusion   Consultants:  Oncology Palliative care  Procedures:  None  Antimicrobials:  Rocephin   8/23---     Objective: Vitals:   01/01/24 0548 01/01/24 1304 01/01/24 2050 01/02/24 0505  BP:  108/67 111/76 105/65  Pulse:  70 66 66  Resp:  20 18 20   Temp:  98.2 F (36.8 C) 97.9 F (36.6 C) 97.8 F (36.6 C)  TempSrc:  Oral Oral Oral  SpO2:  99% 96% 96%  Weight: 91.4 kg     Height:        Intake/Output Summary (Last 24 hours) at 01/02/2024 1251 Last data filed at 01/02/2024 0700 Gross per 24 hour  Intake 2306.76 ml  Output 1200 ml  Net 1106.76 ml   Filed Weights   12/29/23 1513 01/01/24 0548  Weight: 90.7 kg 91.4 kg    Examination:  General exam: Looks fairly comfortable.  He is chronically frail and debilitated. Respiratory system: Clear to  auscultation. Respiratory effort normal.  On room air.  No added sounds.  Cardiovascular system: S1 & S2 heard, RRR.  Gastrointestinal system: Soft.  Nontender.  Bowel sound present. Central nervous system: Alert and oriented. No focal neurological deficits.  Gross generalized weakness. Anasarca.  Multiple ecchymotic lesions on both upper extremities. Patient has rashes mostly on the flexor aspects of the thighs also on the trunk and abdomen. Port-A-Cath right chest wall.    Data Reviewed: I have personally reviewed following labs and imaging studies  CBC: Recent Labs  Lab 12/29/23 1544 12/29/23 2055 12/30/23 0152 12/30/23 0940 12/31/23 1034 01/01/24 0523 01/02/24 0533  WBC 5.2  --  4.3  --  3.2* 2.8* 3.0*  NEUTROABS 4.1  --  3.0  --  2.1 1.7 1.3*  HGB 10.5*   < > 8.9*  8.8* 8.8* 9.5* 8.6* 8.7*  HCT 32.5*   < > 28.8*  29.2* 27.8* 31.0* 27.3* 28.0*  MCV 89.8  --  92.9  --  94.5 92.2 92.7  PLT 107*  --  100*  --  116* 131* 143*   < > = values in this interval not displayed.   Basic Metabolic Panel: Recent Labs  Lab 12/26/23 1328 12/29/23 0152 12/29/23 1544 12/30/23 0152 12/31/23 1034 01/01/24 0523 01/02/24 0533  NA 140  --  137 138 135 138 137  K 3.9  --  3.8 3.7 3.6 3.8 3.9  CL 104  --  104 105 105 109 108  CO2 24  --  22 23 22 23  19*  GLUCOSE 112*  --  110* 93 98 116* 106*  BUN 22  --  23 19 11 12 13   CREATININE 0.77  --  0.83 0.69 0.58* 0.59* 0.57*  CALCIUM  8.9  --  8.3* 7.8* 8.0* 7.9* 8.0*  MG 2.2 2.0  --  2.1 1.9 2.1 2.2  PHOS 3.1  --   --  2.2* 1.9* 2.9 2.1*   GFR: Estimated Creatinine Clearance: 92.2 mL/min (A) (by C-G formula based on SCr of 0.57 mg/dL (L)). Liver Function Tests: Recent Labs  Lab 12/26/23 1328 12/29/23 1544 12/30/23 0152 12/31/23 1034  AST 71* 20 12* 12*  ALT 61* 28 20 16   ALKPHOS 701* 388* 252* 210*  BILITOT 0.6 0.6 0.7 0.7  PROT 5.6* 5.3* 4.8* 5.0*  ALBUMIN 3.1* 2.8* 1.8* 2.0*   No results for input(s): LIPASE, AMYLASE  in the last 168 hours. No results for input(s): AMMONIA in the last 168 hours. Coagulation Profile: Recent Labs  Lab 12/29/23 1544 12/30/23 0152  INR 1.1 1.3*   Cardiac Enzymes: No results for input(s): CKTOTAL, CKMB, CKMBINDEX, TROPONINI in the last 168 hours. BNP (last 3 results) Recent Labs    12/29/23 1544  PROBNP 550.0*   HbA1C: No results for input(s): HGBA1C in the last 72 hours. CBG: Recent Labs  Lab 12/30/23 1811 12/30/23 2028 12/30/23 2336 12/31/23 0616 12/31/23 1130  GLUCAP 84 106* 90 95 97   Lipid Profile: Recent Labs    12/31/23 1034  TRIG 113   Thyroid  Function Tests: No results for input(s): TSH, T4TOTAL, FREET4, T3FREE, THYROIDAB in the last 72 hours.  Anemia Panel: Recent Labs    12/31/23 1034  VITAMINB12 368  FOLATE 39.3  FERRITIN 171  TIBC 237*  IRON 111   Sepsis Labs: Recent Labs  Lab 12/29/23 0152  PROCALCITON 0.29    Recent Results (from the past 240 hours)  Resp panel by RT-PCR (RSV, Flu A&B, Covid) Anterior Nasal Swab     Status: None   Collection Time: 12/29/23  3:16 PM   Specimen: Anterior Nasal Swab  Result Value Ref Range Status   SARS Coronavirus 2 by RT PCR NEGATIVE NEGATIVE Final    Comment: (NOTE) SARS-CoV-2 target nucleic acids are NOT DETECTED.  The SARS-CoV-2 RNA is generally detectable in upper respiratory specimens during the acute phase of infection. The lowest concentration of SARS-CoV-2 viral copies this assay can detect is 138 copies/mL. A negative result does not preclude SARS-Cov-2 infection and should not be used as the sole basis for treatment or other patient management decisions. A negative result may occur with  improper specimen collection/handling, submission of specimen other than nasopharyngeal swab, presence of viral mutation(s) within the areas targeted by this assay, and inadequate number of viral copies(<138 copies/mL). A negative result must be combined  with clinical observations, patient history, and epidemiological information. The expected result is Negative.  Fact Sheet for Patients:  BloggerCourse.com  Fact Sheet for Healthcare Providers:  SeriousBroker.it  This test is no t yet approved or cleared by the [place]  FDA and  has been authorized for detection and/or diagnosis of SARS-CoV-2 by FDA under an Emergency Use Authorization (EUA). This EUA will remain  in effect (meaning this test can be used) for the duration of the COVID-19 declaration under Section 564(b)(1) of the Act, 21 U.S.C.section 360bbb-3(b)(1), unless the authorization is terminated  or revoked sooner.       Influenza A by PCR NEGATIVE NEGATIVE Final   Influenza B by PCR NEGATIVE NEGATIVE Final    Comment: (NOTE) The Xpert Xpress SARS-CoV-2/FLU/RSV plus assay is intended as an aid in the diagnosis of influenza from Nasopharyngeal swab specimens and should not be used as a sole basis for treatment. Nasal washings and aspirates are unacceptable for Xpert Xpress SARS-CoV-2/FLU/RSV testing.  Fact Sheet for Patients: BloggerCourse.com  Fact Sheet for Healthcare Providers: SeriousBroker.it  This test is not yet approved or cleared by the [place]  FDA and has been authorized for detection and/or diagnosis of SARS-CoV-2 by FDA under an Emergency Use Authorization (EUA). This EUA will remain in effect (meaning this test can be used) for the duration of the COVID-19 declaration under Section 564(b)(1) of the Act, 21 U.S.C. section 360bbb-3(b)(1), unless the authorization is terminated or revoked.     Resp Syncytial Virus by PCR NEGATIVE NEGATIVE Final    Comment: (NOTE) Fact Sheet for Patients: BloggerCourse.com  Fact Sheet for Healthcare Providers: SeriousBroker.it  This test is not yet approved  or cleared by the [place]  FDA and has been authorized for detection and/or diagnosis of SARS-CoV-2 by FDA under an Emergency Use Authorization (EUA). This EUA will remain in effect (meaning this test can be used) for the duration of the COVID-19 declaration under Section 564(b)(1) of the Act, 21 U.S.C. section 360bbb-3(b)(1), unless the authorization is terminated or revoked.  Performed at [facility], [address] [place], [place] [address]          Radiology Studies: CT ABDOMEN PELVIS W CONTRAST Result Date: 01/02/2024 CLINICAL DATA:  Metastatic gastric cancer, active chemotherapy * Tracking Code: BO * EXAM: CT ABDOMEN AND PELVIS WITH CONTRAST TECHNIQUE: Multidetector CT imaging of the abdomen and pelvis was performed using the standard protocol following bolus administration of intravenous contrast. RADIATION DOSE REDUCTION: This exam  was performed according to the departmental dose-optimization program which includes automated exposure control, adjustment of the mA and/or kV according to patient size and/or use of iterative reconstruction technique. CONTRAST:  OMNIPAQUE  IOHEXOL  300 MG/ML  SOLN COMPARISON:  CTA chest 12/29/2023 and CT scan 10/31/2023 FINDINGS: Lower chest: Improved pulmonary embolus in the lower lobes compared 12/29/2023. Small bilateral pleural effusions. Aortic and coronary atherosclerosis with aortic valve calcification. Hepatobiliary: 1.1 cm hypodense lesion in segment 4a of the liver on image 31 series 2, stable from 10/31/2023, but otherwise nonspecific. Fluid density 4.8 by 4.2 cm cyst in the right hepatic lobe on image 48 series 2, stable. Mild intrahepatic biliary dilatation persists. Infiltrative density potentially representing tumor in the porta hepatis with poor delineation of the common bile duct. Pancreas: Infiltrative density in the lesser sac region partially obscures interfaces between the pancreas in the stomach, and may  represent tumor. Spleen: Unremarkable Adrenals/Urinary Tract: Unremarkable Stomach/Bowel: Wall thickening in the gastric body on image 43 series 4 similar to prior exam. Suspected tumor caking along the omentum and mesentery adjacent to loops of bowel without current findings of obstruction. Sigmoid colon diverticulosis. Air fluid level in the rectum may indicate diarrheal process. Vascular/Lymphatic: Atherosclerosis is present, including aortoiliac atherosclerotic disease. Difficult to exclude porta hepatis or peripancreatic adenopathy given the surrounding infiltrative process which is likely from tumor. Right gastric node 1.0 cm in short axis on image 37 series 2. Reproductive: Unremarkable Other: Small amount of ascites with enhancement along the paracolic gutters with nodular components compatible with peritoneal tumor spread. Nodularity and bandlike thickening along the omentum and in the mesentery compatible with infiltrative tumor. Mild presacral edema. The amount of ascites anteriorly in the abdomen is reduced compared to prior exam from 10/31/2023. Scattered nodularity in the anterior subcutaneous tissues is probably injection related. Musculoskeletal: Old healed medial right rib fractures. Bridging spurring of both sacroiliac joints. Remote compression fractures at T8 and L1 appear unchanged. IMPRESSION: 1. Improved pulmonary embolus in the lower lobes compared to 12/29/2023. 2. Small bilateral pleural effusions. 3. Infiltrative density in the lesser sac region partially obscures interfaces between the pancreas and the stomach, and may represent tumor. 4. Wall thickening in the gastric body similar to prior exam. 5. Suspected tumor caking along the omentum and mesentery adjacent to loops of bowel without current findings of obstruction. This tumor caking is roughly similar to prior although there is reduced anterior ascites in the abdomen. 6. Small amount of ascites with enhancement along the paracolic  gutters with nodular components compatible with peritoneal tumor spread. 7. 1.1 cm hypodense lesion in segment 4a of the liver, stable from 10/31/2023, but otherwise nonspecific. 8. Air fluid level in the rectum may indicate diarrheal process. 9.  Aortic Atherosclerosis (ICD10-I70.0). Electronically Signed   By: Ryan Salvage M.D.   On: 01/02/2024 08:58   VAS US  LOWER EXTREMITY VENOUS (DVT) Result Date: 01/01/2024  Lower Venous DVT Study Patient Name:  ICHOLAS IRBY  Date of Exam:   01/01/2024 Medical Rec #: [id number]        Accession #:    [id number] Date of Birth: [birth year]       Patient Gender: M Patient Age:   19 years Exam Location:  [facility] Procedure:      VAS US  LOWER EXTREMITY VENOUS (DVT) Referring Phys: ARLEY HOF --------------------------------------------------------------------------------  Indications: Pulmonary embolism.  Comparison Study: 07/25/2022 Rt Femoral vein DVT and Lt PTV DVT Performing Technologist: Elmarie Lindau, RVT  Examination Guidelines: A complete  evaluation includes B-mode imaging, spectral Doppler, color Doppler, and power Doppler as needed of all accessible portions of each vessel. Bilateral testing is considered an integral part of a complete examination. Limited examinations for reoccurring indications may be performed as noted. The reflux portion of the exam is performed with the patient in reverse Trendelenburg.  +---------+---------------+---------+-----------+----------+--------------+ RIGHT    CompressibilityPhasicitySpontaneityPropertiesThrombus Aging +---------+---------------+---------+-----------+----------+--------------+ CFV      Partial                                      Acute          +---------+---------------+---------+-----------+----------+--------------+ SFJ      Full                                                        +---------+---------------+---------+-----------+----------+--------------+ FV Prox  Full                                                         +---------+---------------+---------+-----------+----------+--------------+ FV Mid   Partial                                      Chronic        +---------+---------------+---------+-----------+----------+--------------+ FV DistalFull                                                        +---------+---------------+---------+-----------+----------+--------------+ PFV      Full                                                        +---------+---------------+---------+-----------+----------+--------------+ POP      None                                         Acute          +---------+---------------+---------+-----------+----------+--------------+ PTV      Full                                                        +---------+---------------+---------+-----------+----------+--------------+ PERO     Full                                                        +---------+---------------+---------+-----------+----------+--------------+   +---------+---------------+---------+-----------+----------+--------------+  LEFT     CompressibilityPhasicitySpontaneityPropertiesThrombus Aging +---------+---------------+---------+-----------+----------+--------------+ CFV      Full           Yes      Yes                                 +---------+---------------+---------+-----------+----------+--------------+ SFJ      Full                                                        +---------+---------------+---------+-----------+----------+--------------+ FV Prox  Full                                                        +---------+---------------+---------+-----------+----------+--------------+ FV Mid   Full                                                        +---------+---------------+---------+-----------+----------+--------------+ FV DistalFull                                                         +---------+---------------+---------+-----------+----------+--------------+ PFV      Full                                                        +---------+---------------+---------+-----------+----------+--------------+ POP      Full           Yes      Yes                                 +---------+---------------+---------+-----------+----------+--------------+ PTV      Full                                                        +---------+---------------+---------+-----------+----------+--------------+ PERO     Full                                                        +---------+---------------+---------+-----------+----------+--------------+     Summary: RIGHT: - Findings consistent with acute deep vein thrombosis involving the right common femoral vein, and right popliteal vein.  - Findings consistent with chronic deep vein thrombosis involving the right femoral vein.  - No cystic structure found in the popliteal  fossa.  LEFT: - There is no evidence of deep vein thrombosis in the lower extremity.  - No cystic structure found in the popliteal fossa.  *See table(s) above for measurements and observations. Electronically signed by Gaile New MD on 01/01/2024 at 7:18:13 PM.    Final          Scheduled Meds:  benzonatate   200 mg Oral TID   Chlorhexidine  Gluconate Cloth  6 each Topical Daily   guaiFENesin   600 mg Oral BID   levothyroxine   112 mcg Oral Q0600   mineral oil-hydrophilic petrolatum   Topical BID   pantoprazole  (PROTONIX ) IV  40 mg Intravenous Q12H   phosphorus  500 mg Oral Q4H   Continuous Infusions:  cefTRIAXone  (ROCEPHIN )  IV 1 g (01/01/24 2248)   heparin  1,600 Units/hr (01/02/24 0411)   TPN ADULT (ION) 85 mL/hr at 01/01/24 1752   TPN CYCLIC-ADULT (ION)       LOS: 3 days    Time spent: 40 minutes    Renato Applebaum, MD [facility]

## 2024-01-03 ENCOUNTER — Other Ambulatory Visit (HOSPITAL_COMMUNITY): Payer: Self-pay

## 2024-01-03 ENCOUNTER — Telehealth (HOSPITAL_COMMUNITY): Payer: Self-pay

## 2024-01-03 DIAGNOSIS — C169 Malignant neoplasm of stomach, unspecified: Secondary | ICD-10-CM | POA: Diagnosis not present

## 2024-01-03 DIAGNOSIS — Z515 Encounter for palliative care: Secondary | ICD-10-CM | POA: Diagnosis not present

## 2024-01-03 DIAGNOSIS — Z7189 Other specified counseling: Secondary | ICD-10-CM | POA: Diagnosis not present

## 2024-01-03 DIAGNOSIS — I2699 Other pulmonary embolism without acute cor pulmonale: Secondary | ICD-10-CM | POA: Diagnosis not present

## 2024-01-03 LAB — COMPREHENSIVE METABOLIC PANEL WITH GFR
ALT: 23 U/L (ref 0–44)
AST: 35 U/L (ref 15–41)
Albumin: 2.6 g/dL — ABNORMAL LOW (ref 3.5–5.0)
Alkaline Phosphatase: 183 U/L — ABNORMAL HIGH (ref 38–126)
Anion gap: 10 (ref 5–15)
BUN: 15 mg/dL (ref 8–23)
CO2: 20 mmol/L — ABNORMAL LOW (ref 22–32)
Calcium: 8.2 mg/dL — ABNORMAL LOW (ref 8.9–10.3)
Chloride: 108 mmol/L (ref 98–111)
Creatinine, Ser: 0.62 mg/dL (ref 0.61–1.24)
GFR, Estimated: >60 mL/min (ref >60)
Glucose, Bld: 112 mg/dL — ABNORMAL HIGH (ref 70–99)
Potassium: 4 mmol/L (ref 3.5–5.1)
Sodium: 138 mmol/L (ref 135–145)
Total Bilirubin: 0.3 mg/dL (ref 0.0–1.2)
Total Protein: 4.9 g/dL — ABNORMAL LOW (ref 6.5–8.1)

## 2024-01-03 LAB — CBC WITH DIFFERENTIAL/PLATELET
Abs Granulocyte: 1.9 K/uL (ref 1.5–6.5)
Abs Immature Granulocytes: 0.15 K/uL — ABNORMAL HIGH (ref 0.00–0.07)
Basophils Absolute: 0.1 K/uL (ref 0.0–0.1)
Basophils Relative: 2 %
Eosinophils Absolute: 0.4 K/uL (ref 0.0–0.5)
Eosinophils Relative: 10 %
HCT: 30 % — ABNORMAL LOW (ref 39.0–52.0)
Hemoglobin: 9 g/dL — ABNORMAL LOW (ref 13.0–17.0)
Immature Granulocytes: 4 %
Lymphocytes Relative: 33 %
Lymphs Abs: 1.4 K/uL (ref 0.7–4.0)
MCH: 27.7 pg (ref 26.0–34.0)
MCHC: 30 g/dL (ref 30.0–36.0)
MCV: 92.3 fL (ref 80.0–100.0)
Monocytes Absolute: 0.3 K/uL (ref 0.1–1.0)
Monocytes Relative: 7 %
Neutro Abs: 1.9 K/uL (ref 1.7–7.7)
Neutrophils Relative %: 44 %
Platelets: 176 K/uL (ref 150–400)
RBC: 3.25 MIL/uL — ABNORMAL LOW (ref 4.22–5.81)
RDW: 19.7 % — ABNORMAL HIGH (ref 11.5–15.5)
Smear Review: NORMAL
WBC: 4.1 K/uL (ref 4.0–10.5)
nRBC: 0 % (ref 0.0–0.2)

## 2024-01-03 LAB — MAGNESIUM: Magnesium: 2 mg/dL (ref 1.7–2.4)

## 2024-01-03 LAB — PHOSPHORUS: Phosphorus: 3.3 mg/dL (ref 2.5–4.6)

## 2024-01-03 LAB — HEMOGLOBIN AND HEMATOCRIT, BLOOD
HCT: 30.2 % — ABNORMAL LOW (ref 39.0–52.0)
Hemoglobin: 9.5 g/dL — ABNORMAL LOW (ref 13.0–17.0)

## 2024-01-03 LAB — HEPARIN LEVEL (UNFRACTIONATED): Heparin Unfractionated: 0.4 [IU]/mL (ref 0.30–0.70)

## 2024-01-03 MED ORDER — ENOXAPARIN SODIUM 60 MG/0.6ML IJ SOSY
60.0000 mg | PREFILLED_SYRINGE | Freq: Two times a day (BID) | INTRAMUSCULAR | Status: DC
  Administered 2024-01-03 – 2024-01-04 (×3): 60 mg via SUBCUTANEOUS
  Filled 2024-01-03 (×3): qty 0.6

## 2024-01-03 MED ORDER — TRAVASOL 10 % IV SOLN
INTRAVENOUS | Status: AC
  Filled 2024-01-03: qty 1122

## 2024-01-03 MED ORDER — SODIUM CHLORIDE 0.9 % IV BOLUS
1000.0000 mL | Freq: Once | INTRAVENOUS | Status: AC
  Administered 2024-01-03: 1000 mL via INTRAVENOUS

## 2024-01-03 NOTE — Progress Notes (Signed)
 PHARMACY - ANTICOAGULATION CONSULT NOTE  Pharmacy Consult for heparin   Indication: acute pulmonary embolus and DVT  Allergies  Allergen Reactions   Gold Itching   Mixed Grasses     unknown   Hydroxyquinolines Rash    Patient Measurements: Height: 5' 11 (180.3 cm) Weight: 97 kg (213 lb 13.5 oz) IBW/kg (Calculated) : 75.3 HEPARIN  DW (KG): 90.7  Vital Signs: Temp: 97.4 F (36.3 C) (08/28 0421) Temp Source: Oral (08/28 0421) BP: 101/68 (08/28 0531) Pulse Rate: 70 (08/28 0421)  Labs: Recent Labs    01/01/24 0523 01/02/24 0533 01/03/24 0502  HGB 8.6* 8.7* 9.0*  HCT 27.3* 28.0* 30.0*  PLT 131* 143* 176  HEPARINUNFRC 0.37 0.30 0.40  CREATININE 0.59* 0.57* 0.62    Estimated Creatinine Clearance: 94.8 mL/min (by C-G formula based on SCr of 0.62 mg/dL).   Medical History: Past Medical History:  Diagnosis Date   A-fib (HCC)    Asthma    Cataract    Gastric cancer (HCC)    GERD (gastroesophageal reflux disease)    Lichen planus    Methotrexate, long term, current use    no longer taking   Psoriasis    Sleep apnea    Thyroid  disease    Urticaria     Medications:  - on Lovenox  40 mg daily PTA for VTE pxx   Assessment: Patient is a 78 y.o M with hx afib, anemia, upper GIB with hemorrhagic shock in April 2025, metastatic gastric cancer on chemotherapy treatment (received chemo on 12/27/23) and hx DVT/PE in March 2024 (on Lovenox  40 mg daily for VTE pxx PTA) who presented to the ED on 12/29/23 with c/o cough and hemoptysis. He was subsequently found to have acute bilateral lower lobe PE with no evidence of RHS and acute as well as chronic RLE DVT.  He is currently on heparin  drip for VTE treatment.   Today, 01/03/2024: - heparin  level is therapeutic at 0.40 - cbc somewhat stable - per pt's RN, no hemoptysis or other  new bleeding events noted  Goal of Therapy:  Heparin  level 0.3-0.7 units/ml Monitor platelets by anticoagulation protocol: Yes   Plan:  - continue  heparin  drip at 1600 units/hr - daily heparin  level and cbc - monitor for s/sx bleeding - Dr. Cloretta recom. to transition to therapeutic LMWH soon   Magdelene Ruark P 01/03/2024,7:31 AM

## 2024-01-03 NOTE — Progress Notes (Signed)
 IP PROGRESS NOTE  Subjective:   Mr. Zuckerman has no new complaint.  No pain or nausea.  He ambulated in the hall yesterday.  He reports small amounts of blood-tinged sputum since hospital admission.  Objective: Vital signs in last 24 hours: Blood pressure 101/68, pulse 70, temperature (!) 97.4 F (36.3 C), temperature source Oral, resp. rate 17, height 5' 11 (1.803 m), weight 213 lb 13.5 oz (97 kg), SpO2 96%.  Intake/Output from previous day: 08/27 0701 - 08/28 0700 In: 1408.3 [P.O.:240; I.V.:1068.3; IV Piggyback:100] Out: 1600 [Urine:1600]  Physical Exam:  HEENT: No thrush or ulcers  Abdomen: No hepatosplenomegaly, nondistended, no mass Extremities: No leg edema Skin: Fading rash over the trunk, arms, and legs, ecchymoses over the arms   Portacath/PICC-without erythema  Lab Results: Recent Labs    01/02/24 0533 01/03/24 0502  WBC 3.0* 4.1  HGB 8.7* 9.0*  HCT 28.0* 30.0*  PLT 143* 176    BMET Recent Labs    01/02/24 0533 01/03/24 0502  NA 137 138  K 3.9 4.0  CL 108 108  CO2 19* 20*  GLUCOSE 106* 112*  BUN 13 15  CREATININE 0.57* 0.62  CALCIUM  8.0* 8.2*    Lab Results  Component Value Date   CEA1 <0.6 04/13/2022   CEA 2.32 11/14/2023    Studies/Results: CT ABDOMEN PELVIS W CONTRAST Result Date: 01/02/2024 CLINICAL DATA:  Metastatic gastric cancer, active chemotherapy * Tracking Code: BO * EXAM: CT ABDOMEN AND PELVIS WITH CONTRAST TECHNIQUE: Multidetector CT imaging of the abdomen and pelvis was performed using the standard protocol following bolus administration of intravenous contrast. RADIATION DOSE REDUCTION: This exam was performed according to the departmental dose-optimization program which includes automated exposure control, adjustment of the mA and/or kV according to patient size and/or use of iterative reconstruction technique. CONTRAST:  OMNIPAQUE  IOHEXOL  300 MG/ML  SOLN COMPARISON:  CTA chest 12/29/2023 and CT scan 10/31/2023 FINDINGS: Lower  chest: Improved pulmonary embolus in the lower lobes compared 12/29/2023. Small bilateral pleural effusions. Aortic and coronary atherosclerosis with aortic valve calcification. Hepatobiliary: 1.1 cm hypodense lesion in segment 4a of the liver on image 31 series 2, stable from 10/31/2023, but otherwise nonspecific. Fluid density 4.8 by 4.2 cm cyst in the right hepatic lobe on image 48 series 2, stable. Mild intrahepatic biliary dilatation persists. Infiltrative density potentially representing tumor in the porta hepatis with poor delineation of the common bile duct. Pancreas: Infiltrative density in the lesser sac region partially obscures interfaces between the pancreas in the stomach, and may represent tumor. Spleen: Unremarkable Adrenals/Urinary Tract: Unremarkable Stomach/Bowel: Wall thickening in the gastric body on image 43 series 4 similar to prior exam. Suspected tumor caking along the omentum and mesentery adjacent to loops of bowel without current findings of obstruction. Sigmoid colon diverticulosis. Air fluid level in the rectum may indicate diarrheal process. Vascular/Lymphatic: Atherosclerosis is present, including aortoiliac atherosclerotic disease. Difficult to exclude porta hepatis or peripancreatic adenopathy given the surrounding infiltrative process which is likely from tumor. Right gastric node 1.0 cm in short axis on image 37 series 2. Reproductive: Unremarkable Other: Small amount of ascites with enhancement along the paracolic gutters with nodular components compatible with peritoneal tumor spread. Nodularity and bandlike thickening along the omentum and in the mesentery compatible with infiltrative tumor. Mild presacral edema. The amount of ascites anteriorly in the abdomen is reduced compared to prior exam from 10/31/2023. Scattered nodularity in the anterior subcutaneous tissues is probably injection related. Musculoskeletal: Old healed medial right rib fractures.  Bridging spurring of both  sacroiliac joints. Remote compression fractures at T8 and L1 appear unchanged. IMPRESSION: 1. Improved pulmonary embolus in the lower lobes compared to 12/29/2023. 2. Small bilateral pleural effusions. 3. Infiltrative density in the lesser sac region partially obscures interfaces between the pancreas and the stomach, and may represent tumor. 4. Wall thickening in the gastric body similar to prior exam. 5. Suspected tumor caking along the omentum and mesentery adjacent to loops of bowel without current findings of obstruction. This tumor caking is roughly similar to prior although there is reduced anterior ascites in the abdomen. 6. Small amount of ascites with enhancement along the paracolic gutters with nodular components compatible with peritoneal tumor spread. 7. 1.1 cm hypodense lesion in segment 4a of the liver, stable from 10/31/2023, but otherwise nonspecific. 8. Air fluid level in the rectum may indicate diarrheal process. 9.  Aortic Atherosclerosis (ICD10-I70.0). Electronically Signed   By: Ryan Salvage M.D.   On: 01/02/2024 08:58   VAS US  LOWER EXTREMITY VENOUS (DVT) Result Date: 01/01/2024  Lower Venous DVT Study Patient Name:  AIKAM VINJE  Date of Exam:   01/01/2024 Medical Rec #: [id number]        Accession #:    [id number] Date of Birth: [birth year]       Patient Gender: M Patient Age:   78 years Exam Location:  [facility] Procedure:      VAS US  LOWER EXTREMITY VENOUS (DVT) Referring Phys: ARLEY HOF --------------------------------------------------------------------------------  Indications: Pulmonary embolism.  Comparison Study: 07/25/2022 Rt Femoral vein DVT and Lt PTV DVT Performing Technologist: Elmarie Lindau, RVT  Examination Guidelines: A complete evaluation includes B-mode imaging, spectral Doppler, color Doppler, and power Doppler as needed of all accessible portions of each vessel. Bilateral testing is considered an integral part of a complete examination. Limited  examinations for reoccurring indications may be performed as noted. The reflux portion of the exam is performed with the patient in reverse Trendelenburg.  +---------+---------------+---------+-----------+----------+--------------+ RIGHT    CompressibilityPhasicitySpontaneityPropertiesThrombus Aging +---------+---------------+---------+-----------+----------+--------------+ CFV      Partial                                      Acute          +---------+---------------+---------+-----------+----------+--------------+ SFJ      Full                                                        +---------+---------------+---------+-----------+----------+--------------+ FV Prox  Full                                                        +---------+---------------+---------+-----------+----------+--------------+ FV Mid   Partial                                      Chronic        +---------+---------------+---------+-----------+----------+--------------+ FV DistalFull                                                        +---------+---------------+---------+-----------+----------+--------------+  PFV      Full                                                        +---------+---------------+---------+-----------+----------+--------------+ POP      None                                         Acute          +---------+---------------+---------+-----------+----------+--------------+ PTV      Full                                                        +---------+---------------+---------+-----------+----------+--------------+ PERO     Full                                                        +---------+---------------+---------+-----------+----------+--------------+   +---------+---------------+---------+-----------+----------+--------------+ LEFT     CompressibilityPhasicitySpontaneityPropertiesThrombus Aging  +---------+---------------+---------+-----------+----------+--------------+ CFV      Full           Yes      Yes                                 +---------+---------------+---------+-----------+----------+--------------+ SFJ      Full                                                        +---------+---------------+---------+-----------+----------+--------------+ FV Prox  Full                                                        +---------+---------------+---------+-----------+----------+--------------+ FV Mid   Full                                                        +---------+---------------+---------+-----------+----------+--------------+ FV DistalFull                                                        +---------+---------------+---------+-----------+----------+--------------+ PFV      Full                                                        +---------+---------------+---------+-----------+----------+--------------+  POP      Full           Yes      Yes                                 +---------+---------------+---------+-----------+----------+--------------+ PTV      Full                                                        +---------+---------------+---------+-----------+----------+--------------+ PERO     Full                                                        +---------+---------------+---------+-----------+----------+--------------+     Summary: RIGHT: - Findings consistent with acute deep vein thrombosis involving the right common femoral vein, and right popliteal vein.  - Findings consistent with chronic deep vein thrombosis involving the right femoral vein.  - No cystic structure found in the popliteal fossa.  LEFT: - There is no evidence of deep vein thrombosis in the lower extremity.  - No cystic structure found in the popliteal fossa.  *See table(s) above for measurements and observations. Electronically signed by Gaile New MD on 01/01/2024 at 7:18:13 PM.    Final      Medications: I have reviewed the patient's current medications.  Assessment/Plan:  Gastric cancer with CT evidence of abdominal carcinomatosis CT abdomen/pelvis 04/13/2022-ascites, diffuse omental and peritoneal nodularity, possible circumferential mass in the transverse colon, new small left greater than right pleural effusions CT abdominal mass biopsy 04/24/2022-metastatic poorly differentiated adenocarcinoma with very focal signet ring cell features CT paracentesis 04/24/2022-no malignant cells identified Colonoscopy 04/25/2022-diverticulosis in the sigmoid colon.  Internal hemorrhoids.  No specimens collected. Upper endoscopy 04/25/2022-large infiltrative mass with oozing bleeding and stigmata of recent bleeding in the gastric fundus, on the anterior wall of the gastric body and on the greater curvature of the gastric body; deformity in the gastric antrum.  Extrinsic deformity in the entire duodenum.  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 Her2 by IHC negative, 1+; MMR IHC normal; PD-L1 CPS 1%, positive. Claudin 18 positive, 100% Cycle 1 FOLFOX 05/04/2022 Cycle 2 FOLFOX 05/17/2022 Cycle 3 FOLFOX 05/31/2022-oxaliplatin  dose reduced secondary to prolonged cold sensitivity Cycle 4 FOLFOX 06/14/2022 Cycle 5 FOLFOX 06/28/2022 CT abdomen/pelvis 07/08/2022-asymmetric wall thickening at the lesser curvature of the stomach, decreased ascites, increased diffuse omental and peritoneal nodularity Cycle 6 FOLFOX 07/12/2022 Cycle 7 FOLFOX 08/09/2022 Cycle 8 FOLFOX 08/23/2022 Cycle 9 FOLFOX 09/06/2022 CTs 09/11/2022-stable, no new or progressive interval findings Cycle 10 FOLFOX 09/20/2022 Xeloda  maintenance 2 weeks on/1 week off 10/16/2022 Cycle 2 Xeloda  11/06/2022-Xeloda  dose reduced secondary to early foot skin toxicity Cycle 3 Xeloda  11/27/2022 Cycle 4 Xeloda  12/18/2022 CTs 01/02/2023-wall thickening of the mid  gastric body-possibly progressive, unchanged peritoneal/omental caking, unchanged L1 compression fracture Cycle 5 Xeloda  beginning 01/08/2023 Cycle 6 Xeloda  beginning 01/29/2023 Cycle 7 Xeloda  beginning 02/19/2023 Cycle 8 Xeloda  beginning 03/12/2023 Cycle 9 Xeloda  beginning 04/09/2023 Cycle 10 Xeloda  beginning 05/07/2023 CTs 05/21/2023: Unchanged wall thickening of the gastric body, slight increase in small volume ascites, diffuse peritoneal  thickening and stranding of the omentum, no evidence of metastatic disease to the chest Cycle 11 Xeloda  beginning 05/28/2023 Cycle 12 Xeloda  beginning 06/18/2023 Cycle 13 Xeloda  beginning 07/09/2023 Cycle 14 Xeloda  beginning 07/30/2023 CTA abdomen/pelvis 08/14/2023: Large volume of heterogenous material in the gastric lumen-food/blood, small volume ascites with diffuse peritoneal thickening and matting of the bowel/omentum, enhancement of the gastric body 08/18/2023: EGD-large mass in the gastric fundus/body with no bleeding, clean-based ulcers overlying 1 area of the mass, normal duodenum Cycle 15 Xeloda  09/03/2023 Cycle 16 Xeloda  09/24/2023 CTs 10/31/2023-moderate gastric body wall thickening similar.  Ill-definition of soft tissue planes in the lesser sac and porta hepatis new or progressive.  Development of intrahepatic duct dilatation.  Increase in small volume abdominal ascites with persistent peritoneal metastasis.  New trace left pleural fluid.  New T8 heterogeneous sclerosis and vertebral body height loss. Cycle 1 FOLFOX plus zolbetuximab beginning 11/14/2023 (zolbetuximab 11/14/2023, FOLFOX 11/15/2023) Cycle 2 FOLFOX plus zolbetuximab (zolbetuximab 11/28/23, FOLFOX anticipated 11/29/23) Cycle 3 FOLFOX plus zolbetuximab 12/12/2023 Cycle 4 FOLFOX plus zolbetuximab 12/27/2023 CT abdomen/pelvis 01/01/2024: Improved lower lobe pulmonary embolism, stable gastric wall thickening, infiltrative density in the lesser sac region, tumor caking along the omentum and mesentery-stable,  reduced anterior ascites stable 1.1 cm segment 4 liver lesion Truncal rash-status post recent punch biopsy with pathology pending; biopsy reported negative per family 04/26/2022 Lichen planus of the feet Hypothyroidism Atrial fibrillation Admission 07/24/2022 with acute bilateral pulmonary embolism/right heart strain Heparin  anticoagulation 07/24/2022 Doppler 07/24/2022-acute DVT of the right femoral, popliteal, posterior tibial, and peroneal veins, acute left posterior tibial DVT Lovenox  anticoagulation, changed to once daily dosing 05/25/2023 Lovenox  changed to 40 mg daily 08/30/2023 7.  Anemia secondary to chemotherapy, phlebotomy, and hemoptysis-improved 8.  L1 compression fracture on plain x-ray 12/18/2022 MRI lumbar spine 02/02/2023-acute/subacute L1 compression fracture, mild degenerative change of the lumbar spine 9.  Admission 08/14/2023 with hematemesis 10. C. Difficile positive by PCR-completed course of Vancomycin  11.  Admission 12/29/2023 with bilateral pulmonary embolism-heparin  Dopplers 01/01/2024: Acute right common femoral and popliteal DVT 01/03/2024 heparin  discontinued, Lovenox  60 mg twice daily 12.  Diffuse pruritic rash-etiology?  Improved   Mr. Bruington is admitted with recurrent pulmonary embolism while maintained on Lovenox  anticoagulation.  I reviewed the chest CT images.  He resumed Lovenox  at a prophylactic dose on 08/30/2023 after admission with GI bleeding in early April 2025.  He has been maintained on heparin  anticoagulation since hospital admission.  Pulmonary emboli were improved on the CT abdomen/pelvis 01/01/2024.  Anticoagulation will be changed to Lovenox  today.  He understands the risk of bleeding and recurrent venous thromboembolic disease.  He agrees to proceed.   I discussed the CT findings with Mr. Colee and his wife again today.  The CTs reveal no evidence of disease progression and reduced ascites.  We discussed the indication for continuing chemotherapy versus  comfort care.  He will continue discussions with his family and return for an office visit as scheduled next week.   Mr. Jurgens has advanced metastatic gastric cancer presenting with carcinomatosis in December 2023.  The goals of systemic therapy are to palliate symptoms and potentially extend survival.  No therapy will be curative.    The rash appears improved.  I doubt the rash is related to chemotherapy as he completed 3 cycles without development of a rash.    He appears stable for discharge and oncology standpoint.  Recommendations: Discontinue heparin , begin Lovenox  Continue discussions with Mr. Laster regarding continuation of FOLFOX/zolbetuximab versus hospice Continue TPN Follow-up  as scheduled at the Cancer center 01/09/2024.     LOS: 4 days   Arley Hof, MD   01/03/2024, 7:11 AM

## 2024-01-03 NOTE — Progress Notes (Signed)
 Daily Progress Note   Patient Name: Todd Harrison       Date: 01/03/2024 DOB: January 06, 1946  Age: 78 y.o. MRN#: [id number] Attending Physician: Raenelle Coria, MD Primary Care Physician: Henry Ingle, MD Admit Date: 12/29/2023  Reason for Consultation/Follow-up: Establishing goals of care  Patient Profile/HPI:  78 y.o. male  with past medical history of stage IV gastric cancer with peritoneal mets currently on FOLFOX/zolbetuximab- last dose 12/27/23, a-fib, bilateral PE's with DVT- on Lovenox , hospitalization 08/2023 for GI hemorrhage requiring intubation, dHF, chronic diarrhea (on TPN), presented to ED on 12/29/2023 with hemoptysis.  CTA of chest indicated bilateral lower lobe PE- no heart strain. Started on heparin  drip. High risk for recurrent hemorrhage of gastric malignancy. Palliative consulted for discussion of options in the event that his clinical status declines. There is concern that there will be difficulty reaching adequate anticoagulation due to his continued hemoptysis and risk for GI hemorrhage.   Subjective: Chart reviewed including labs, progress notes, imaging from this and previous encounters.  Todd Harrison is sitting up in chair at bedside, his spouse is also present.  They share that he is likely to discharge tomorrow.  They have discussing some concerns they have about going home. Todd Harrison is understandably worried about the unknown. He worries what decisions he will make if he starts to hemorrhage again.  He notes that he is tired of all the medical interventions, but he also worries about leaving his family.  We discussed possible legacy planning as a way to help with his anxiety about leaving his family members when he dies.  He is uncertain about if he would want aggressive life  sustaining measures if he had rapid decline. I recommended that he continue to discuss his concerns with his medical providers and family and make decisions based on his own values, goals, and his own perception of his quality of life.  They inquired about home palliative services. I reviewed Palliative in the community and they would like referral for Palliative visits at home.  We deferred completing a MOST form as Todd Harrison is not ready to make decisions about his scope of care.   Review of Systems  Respiratory:  Positive for cough and hemoptysis.      Physical Exam Vitals and nursing note reviewed.  Constitutional:      General:  He is not in acute distress. Cardiovascular:     Rate and Rhythm: Normal rate.  Pulmonary:     Effort: Pulmonary effort is normal.  Skin:    Comments: Scattered bruising on arms  Neurological:     Mental Status: He is alert and oriented to person, place, and time.             Vital Signs: BP 101/68 (BP Location: Right Arm)   Pulse 70   Temp (!) 97.4 F (36.3 C) (Oral)   Resp 17   Ht 5' 11 (1.803 m)   Wt 97 kg   SpO2 96%   BMI 29.83 kg/m  SpO2: SpO2: 96 % O2 Device: O2 Device: Room Air O2 Flow Rate:    Intake/output summary:  Intake/Output Summary (Last 24 hours) at 01/03/2024 1232 Last data filed at 01/03/2024 0800 Gross per 24 hour  Intake 1408.26 ml  Output 1850 ml  Net -441.74 ml   LBM: Last BM Date : 01/02/24 Baseline Weight: Weight: 90.7 kg Most recent weight: Weight: 97 kg       Palliative Assessment/Data: PPS: 50%      Patient Active Problem List   Diagnosis Date Noted   Protein-calorie malnutrition, severe 01/01/2024   Chronic anticoagulation 12/30/2023   Goals of care, counseling/discussion 12/30/2023   Acute pulmonary embolism (HCC) 12/30/2023   Acute pulmonary embolism without acute cor pulmonale (HCC) 12/29/2023   Cough with hemoptysis 12/29/2023   Pressure injury of skin 12/29/2023   Chronic diarrhea 12/29/2023    Thrombocytopenia (HCC) 12/29/2023   Gastric cancer (HCC) 12/29/2023   Chronic diastolic CHF (congestive heart failure) (HCC) 12/29/2023   GERD (gastroesophageal reflux disease) 12/29/2023   Anemia of chronic disease 12/29/2023   Cervical disc disorder with radiculopathy of cervical region 11/08/2023   ABLA (acute blood loss anemia) 08/17/2023   Hemorrhagic shock (HCC) 08/15/2023   Hematemesis 08/14/2023   Acute upper GI bleed 08/14/2023   Malnutrition of moderate degree 07/25/2022   Hemoptysis 07/25/2022   Infarct of lung (HCC) 07/25/2022   Pulmonary embolus (HCC) 07/25/2022   Acute saddle pulmonary embolism with acute cor pulmonale (HCC) 07/24/2022   Paroxysmal atrial fibrillation (HCC) 07/24/2022   Lichen planus 07/24/2022   Malignant neoplasm of stomach (HCC) 04/26/2022   Aortic atherosclerosis (HCC) 04/17/2022   Abnormal CT of the chest 12/16/2020   COVID-19 virus infection 11/25/2020   OSA (obstructive sleep apnea) 05/19/2020   Persistent atrial fibrillation (HCC)    Daytime hypersomnolence 02/16/2020   Tachycardia 01/22/2020   Atrial fibrillation with RVR (HCC) 01/22/2020   Pneumonia 01/09/2020   Asthmatic bronchitis 10/20/2019   Methotrexate, long term, current use    Cough present for greater than 3 weeks 04/30/2019   Shortness of breath 04/30/2019   Complicated acute bronchitis 04/30/2019   Allergic contact dermatitis 09/09/2018   Acquired hypothyroidism 06/09/2016   Rotator cuff tear, right 09/22/2010   METATARSALGIA 11/24/2008   FOOT PAIN, LEFT 11/24/2008   TALIPES CAVUS 11/24/2008    Palliative Care Assessment & Plan    Assessment/Recommendations/Plan  Goals of care- continue current interventions- monitor for decompensation or complications with anticoagulation-hopeful for d/c home tomorrow TOC order placed for referral to home Palliative PMT will continue to follow peripherally will follow up prn   Code Status:   Code Status: Do not attempt  resuscitation (DNR) PRE-ARREST INTERVENTIONS DESIRED   Prognosis:  Unable to determine  Discharge Planning: To Be Determined  Care plan was discussed with patient, his family and care  team  Thank you for allowing the Palliative Medicine Team to assist in the care of this patient.  Total time:  60 minutes Prolonged billing:  Time includes:   Preparing to see the patient (e.g., review of tests) Obtaining and/or reviewing separately obtained history Performing a medically necessary appropriate examination and/or evaluation Counseling and educating the patient/family/caregiver Ordering medications, tests, or procedures Referring and communicating with other health care professionals (when not reported separately) Documenting clinical information in the electronic or other health record Independently interpreting results (not reported separately) and communicating results to the patient/family/caregiver Care coordination (not reported separately) Clinical documentation  Cassondra Stain, AGNP-C Palliative Medicine   Please contact Palliative Medicine Team phone at [phone] for questions and concerns.       SABRA

## 2024-01-03 NOTE — Progress Notes (Signed)
 PHARMACY - TOTAL PARENTERAL NUTRITION CONSULT NOTE   Indication: chronic TPN  Patient Measurements: Height: 5' 11 (180.3 cm) Weight: 97 kg (213 lb 13.5 oz) IBW/kg (Calculated) : 75.3 TPN AdjBW (KG): 90.7 Body mass index is 29.83 kg/m. Usual Weight:   Assessment:  Patient is a 78yo M with PMH of metastatic gastric cancer undergoing chemotherapy on chronic TPN since July 2025 due to reduced po intake. Patient presented on 12/29/23 from [facility] with c/o cough and hemoptysis and was subsequently found to have bilateral PE with no RHS and RLE DVT. Pharmacy has been consulted to dose TPN while inpatient.   Glucose / Insulin : No hx of DM - Not checking CBGs PTA - finger sticks & SSI stopped 8/25 per pt request - 8/25 decadron  6 po qday x 2 days completed - CBGs (goal <150): 112 from CMET  Electrolytes: CO2 low at 20; other lytes wnl including CorrCa Renal: Scr stable (BL 1-1.1), BUN WNL Hepatic: AST/ALT, Tbili wnl;  Alk phos elevated but down to 183 - Albumin low 2.6 - TG 113 (8/25) Intake / Output; MIVF: no mIVF - I/O:  - 192 mL - UOP: 1600 ml - last BM: 8/27 GI Imaging: CT 6/25 - L hepatic lobe lesion suspicion, moderate gastric wall thickening. No gastric outlet obstruction GI Surgeries / Procedures:   Central access: chest port present on admission TPN start date: in patient: 12/30/2023 On TPN PTA  Nutritional Goals: Goal TPN rate is 85 mL/hr (provides ~ 112 g of protein and ~ 2240 kcals per day)  - 8/28: Obtained TPN formula record from [facility] (see media tab).  Patient was on cyclic 12 hr TPN PTA.   RD Assessment: Estimated Needs Total Energy Estimated Needs: 2200-2400 Total Protein Estimated Needs: 110-130 grams Total Fluid Estimated Needs: >/= 2 L  Current Nutrition:  Regular diet ordered but not eating d/t no taste TPN @ 39ml/hr Ensure High Protein DC'd 8/25 - pt refused all doses  Plan:   Today, at 1800 Adjust TPN cyclic to 12 hrs TPN provides 100 % of  estimated needs Electrolytes in TPN:  Na 50mEq/L K 50mEq/L Ca 77mEq/L Mg 91mEq/L Phos 20 mmol/L Cl:Ac 1:2 Add standard MVI and trace elements to TPN SSI & CBGs stopped 8/25 per pt request  Monitor TPN labs on Mon/Thurs  Iantha Batch, PharmD, BCPS 01/03/2024 7:53 AM

## 2024-01-03 NOTE — Plan of Care (Signed)

## 2024-01-03 NOTE — Progress Notes (Signed)
 OT Cancellation Note  Patient Details Name: Todd Harrison MRN: [id number] DOB: 1945-12-22   Cancelled Treatment:    Reason Eval/Treat Not Completed: Medical issues which prohibited therapy  Nursing in with patient while managing BP issues. OT will re-attempt evaluation once stable and able to participate as schedule allows.   Jermari Tamargo OT/L Acute Rehabilitation Department  [phone]   01/03/2024, 2:07 PM

## 2024-01-03 NOTE — Telephone Encounter (Signed)
 Pharmacy Patient Advocate Encounter  Insurance verification completed.    The patient is insured through [employer]. Patient has Medicare and is not eligible for a copay card, but may be able to apply for patient assistance or Medicare RX Payment Plan (Patient Must reach out to their plan, if eligible for payment plan), if available.    Ran test claim for ENOXAPARIN  INJ 80 MG/0.8ML BID and the current 30 day co-pay is $134.40.  Ran test claim for ENOXAPARIN  INJ 100 MG/1ML BID and the current 30 day co-pay is $368.44  This test claim was processed through [facility]- copay amounts may vary at other pharmacies due to pharmacy/plan contracts, or as the patient moves through the different stages of their insurance plan.

## 2024-01-04 ENCOUNTER — Other Ambulatory Visit: Payer: Self-pay

## 2024-01-04 ENCOUNTER — Other Ambulatory Visit (HOSPITAL_COMMUNITY): Payer: Self-pay

## 2024-01-04 ENCOUNTER — Encounter: Payer: Self-pay | Admitting: Oncology

## 2024-01-04 ENCOUNTER — Telehealth: Payer: Self-pay | Admitting: *Deleted

## 2024-01-04 DIAGNOSIS — C169 Malignant neoplasm of stomach, unspecified: Secondary | ICD-10-CM | POA: Diagnosis not present

## 2024-01-04 DIAGNOSIS — I2690 Septic pulmonary embolism without acute cor pulmonale: Secondary | ICD-10-CM | POA: Diagnosis not present

## 2024-01-04 DIAGNOSIS — I2699 Other pulmonary embolism without acute cor pulmonale: Secondary | ICD-10-CM | POA: Diagnosis not present

## 2024-01-04 DIAGNOSIS — Z7901 Long term (current) use of anticoagulants: Secondary | ICD-10-CM | POA: Diagnosis not present

## 2024-01-04 LAB — CBC WITH DIFFERENTIAL/PLATELET
Abs Immature Granulocytes: 0.26 K/uL — ABNORMAL HIGH (ref 0.00–0.07)
Basophils Absolute: 0.1 K/uL (ref 0.0–0.1)
Basophils Relative: 2 %
Eosinophils Absolute: 0.4 K/uL (ref 0.0–0.5)
Eosinophils Relative: 9 %
HCT: 30.5 % — ABNORMAL LOW (ref 39.0–52.0)
Hemoglobin: 9.3 g/dL — ABNORMAL LOW (ref 13.0–17.0)
Immature Granulocytes: 6 %
Lymphocytes Relative: 28 %
Lymphs Abs: 1.3 K/uL (ref 0.7–4.0)
MCH: 28 pg (ref 26.0–34.0)
MCHC: 30.5 g/dL (ref 30.0–36.0)
MCV: 91.9 fL (ref 80.0–100.0)
Monocytes Absolute: 0.5 K/uL (ref 0.1–1.0)
Monocytes Relative: 11 %
Neutro Abs: 2.1 K/uL (ref 1.7–7.7)
Neutrophils Relative %: 44 %
Platelets: 187 K/uL (ref 150–400)
RBC: 3.32 MIL/uL — ABNORMAL LOW (ref 4.22–5.81)
RDW: 19.6 % — ABNORMAL HIGH (ref 11.5–15.5)
Smear Review: NORMAL
WBC: 4.8 K/uL (ref 4.0–10.5)
nRBC: 0.6 % — ABNORMAL HIGH (ref 0.0–0.2)

## 2024-01-04 LAB — BASIC METABOLIC PANEL WITH GFR
Anion gap: 9 (ref 5–15)
BUN: 18 mg/dL (ref 8–23)
CO2: 21 mmol/L — ABNORMAL LOW (ref 22–32)
Calcium: 8.2 mg/dL — ABNORMAL LOW (ref 8.9–10.3)
Chloride: 107 mmol/L (ref 98–111)
Creatinine, Ser: 0.62 mg/dL (ref 0.61–1.24)
GFR, Estimated: >60 mL/min (ref >60)
Glucose, Bld: 116 mg/dL — ABNORMAL HIGH (ref 70–99)
Potassium: 4.6 mmol/L (ref 3.5–5.1)
Sodium: 137 mmol/L (ref 135–145)

## 2024-01-04 LAB — PHOSPHORUS: Phosphorus: 3.1 mg/dL (ref 2.5–4.6)

## 2024-01-04 LAB — MAGNESIUM: Magnesium: 2.1 mg/dL (ref 1.7–2.4)

## 2024-01-04 MED ORDER — HEPARIN SOD (PORK) LOCK FLUSH 100 UNIT/ML IV SOLN
500.0000 [IU] | INTRAVENOUS | Status: AC | PRN
  Administered 2024-01-04: 500 [IU]

## 2024-01-04 MED ORDER — BENZONATATE 200 MG PO CAPS
200.0000 mg | ORAL_CAPSULE | Freq: Three times a day (TID) | ORAL | 0 refills | Status: AC | PRN
  Filled 2024-01-04: qty 20, 7d supply, fill #0

## 2024-01-04 MED ORDER — TRAVASOL 10 % IV SOLN
INTRAVENOUS | Status: DC
  Filled 2024-01-04: qty 1122

## 2024-01-04 MED ORDER — GUAIFENESIN ER 600 MG PO TB12
600.0000 mg | ORAL_TABLET | Freq: Two times a day (BID) | ORAL | 0 refills | Status: AC
  Filled 2024-01-04: qty 60, 30d supply, fill #0

## 2024-01-04 MED ORDER — ENOXAPARIN SODIUM 60 MG/0.6ML IJ SOSY
60.0000 mg | PREFILLED_SYRINGE | Freq: Two times a day (BID) | INTRAMUSCULAR | 2 refills | Status: DC
  Filled 2024-01-04: qty 36, 30d supply, fill #0

## 2024-01-04 MED ORDER — HYDROXYZINE HCL 25 MG PO TABS
25.0000 mg | ORAL_TABLET | ORAL | 0 refills | Status: DC | PRN
  Filled 2024-01-04: qty 30, 5d supply, fill #0

## 2024-01-04 NOTE — TOC Transition Note (Addendum)
 Transition of Care [facility]) - Discharge Note   Patient Details  Name: Todd Harrison MRN: [id number] Date of Birth: 1945-10-04  [facility] CM/SW Contact:  Bascom Service, RN Phone Number: 01/04/2024, 10:28 AM   Clinical Narrative: d/c home w/resumption of HHRN-TPN w/[facility] rep Pam;[facility]/OT rep Le[place] accepted. Await confirmation on dme order rw vs rollator. -10:40a   rollator ordered [employer] rep Jermaine. to be delivered to rm prior d/c. HHC all set up. No further CM needs.    Final next level of care: Home w Home Health Services Barriers to Discharge: No Barriers Identified   Patient Goals and CMS Choice Patient states their goals for this hospitalization and ongoing recovery are:: Home CMS Medicare.gov Compare Post Acute Care list provided to:: Patient Represenative (must comment) Choice offered to / list presented to : Spouse [place] ownership interest in [facility].provided to:: Spouse    Discharge Placement                       Discharge Plan and Services Additional resources added to the After Visit Summary for     Discharge Planning Services: CM Consult Post Acute Care Choice: Home Health, Resumption of Svcs/PTA Provider                    HH Arranged: PT, OT, TPN, RN HH Agency: [facility] Date [facility] Agency Contacted: 01/04/24 Time HH Agency Contacted: 1028 Representative spoke with at [facility] Agency: Pam-[facility]/Brandy-[facility]  Social Drivers of Health (SDOH) Interventions SDOH Screenings   Food Insecurity: No Food Insecurity (12/30/2023)  Housing: Low Risk  (12/30/2023)  Transportation Needs: No Transportation Needs (12/30/2023)  Utilities: Not At Risk (12/30/2023)  Depression (PHQ2-9): Low Risk  (11/22/2023)  Financial Resource Strain: Low Risk  (04/17/2022)  Social Connections: Moderately Isolated (12/29/2023)  Stress: No Stress Concern Present (04/17/2022)  Tobacco Use: Medium Risk (12/29/2023)      Readmission Risk Interventions    12/31/2023   11:33 AM 07/28/2022    2:24 PM  Readmission Risk Prevention Plan  Transportation Screening Complete Complete  PCP or Specialist Appt within 5-7 Days  Complete  Home Care Screening  Complete  Medication Review (RN CM)  Complete  Medication Review (RN Care Manager) Complete   PCP or Specialist appointment within 3-5 days of discharge Complete   HRI or Home Care Consult Complete   SW Recovery Care/Counseling Consult Complete   Skilled Nursing Facility Not Applicable

## 2024-01-04 NOTE — Progress Notes (Signed)
 IP PROGRESS NOTE  Subjective:   Todd Harrison has no new complaint.  No pain or nausea.  Continues to have small amounts of hemoptysis 4-5 times per day.  Objective: Vital signs in last 24 hours: Blood pressure 100/61, pulse 84, temperature 98.3 F (36.8 C), temperature source Oral, resp. rate 19, height 5' 11 (1.803 m), weight 213 lb 13.5 oz (97 kg), SpO2 97%.  Intake/Output from previous day: 08/28 0701 - 08/29 0700 In: 1901.9 [I.V.:1901.9] Out: 2250 [Urine:2250]  Physical Exam:  HEENT: No thrush or ulcers  Abdomen: No hepatosplenomegaly, nondistended, no mass Extremities: No leg edema Skin: The rash over the trunk and extremities has almost completely resolved   Portacath/PICC-without erythema  Lab Results: Recent Labs    01/03/24 0502 01/03/24 1555 01/04/24 0540  WBC 4.1  --  4.8  HGB 9.0* 9.5* 9.3*  HCT 30.0* 30.2* 30.5*  PLT 176  --  187    BMET Recent Labs    01/03/24 0502 01/04/24 0540  NA 138 137  K 4.0 4.6  CL 108 107  CO2 20* 21*  GLUCOSE 112* 116*  BUN 15 18  CREATININE 0.62 0.62  CALCIUM  8.2* 8.2*    Lab Results  Component Value Date   CEA1 <0.6 04/13/2022   CEA 2.32 11/14/2023    Studies/Results: No results found.    Medications: I have reviewed the patient's current medications.  Assessment/Plan:  Gastric cancer with CT evidence of abdominal carcinomatosis CT abdomen/pelvis 04/13/2022-ascites, diffuse omental and peritoneal nodularity, possible circumferential mass in the transverse colon, new small left greater than right pleural effusions CT abdominal mass biopsy 04/24/2022-metastatic poorly differentiated adenocarcinoma with very focal signet ring cell features CT paracentesis 04/24/2022-no malignant cells identified Colonoscopy 04/25/2022-diverticulosis in the sigmoid colon.  Internal hemorrhoids.  No specimens collected. Upper endoscopy 04/25/2022-large infiltrative mass with oozing bleeding and stigmata of recent bleeding in  the gastric fundus, on the anterior wall of the gastric body and on the greater curvature of the gastric body; deformity in the gastric antrum.  Extrinsic deformity in the entire duodenum.  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 Her2 by IHC negative, 1+; MMR IHC normal; PD-L1 CPS 1%, positive. Claudin 18 positive, 100% Cycle 1 FOLFOX 05/04/2022 Cycle 2 FOLFOX 05/17/2022 Cycle 3 FOLFOX 05/31/2022-oxaliplatin  dose reduced secondary to prolonged cold sensitivity Cycle 4 FOLFOX 06/14/2022 Cycle 5 FOLFOX 06/28/2022 CT abdomen/pelvis 07/08/2022-asymmetric wall thickening at the lesser curvature of the stomach, decreased ascites, increased diffuse omental and peritoneal nodularity Cycle 6 FOLFOX 07/12/2022 Cycle 7 FOLFOX 08/09/2022 Cycle 8 FOLFOX 08/23/2022 Cycle 9 FOLFOX 09/06/2022 CTs 09/11/2022-stable, no new or progressive interval findings Cycle 10 FOLFOX 09/20/2022 Xeloda  maintenance 2 weeks on/1 week off 10/16/2022 Cycle 2 Xeloda  11/06/2022-Xeloda  dose reduced secondary to early foot skin toxicity Cycle 3 Xeloda  11/27/2022 Cycle 4 Xeloda  12/18/2022 CTs 01/02/2023-wall thickening of the mid gastric body-possibly progressive, unchanged peritoneal/omental caking, unchanged L1 compression fracture Cycle 5 Xeloda  beginning 01/08/2023 Cycle 6 Xeloda  beginning 01/29/2023 Cycle 7 Xeloda  beginning 02/19/2023 Cycle 8 Xeloda  beginning 03/12/2023 Cycle 9 Xeloda  beginning 04/09/2023 Cycle 10 Xeloda  beginning 05/07/2023 CTs 05/21/2023: Unchanged wall thickening of the gastric body, slight increase in small volume ascites, diffuse peritoneal thickening and stranding of the omentum, no evidence of metastatic disease to the chest Cycle 11 Xeloda  beginning 05/28/2023 Cycle 12 Xeloda  beginning 06/18/2023 Cycle 13 Xeloda  beginning 07/09/2023 Cycle 14 Xeloda  beginning 07/30/2023 CTA abdomen/pelvis 08/14/2023: Large volume of heterogenous material in the gastric lumen-food/blood, small volume  ascites with diffuse  peritoneal thickening and matting of the bowel/omentum, enhancement of the gastric body 08/18/2023: EGD-large mass in the gastric fundus/body with no bleeding, clean-based ulcers overlying 1 area of the mass, normal duodenum Cycle 15 Xeloda  09/03/2023 Cycle 16 Xeloda  09/24/2023 CTs 10/31/2023-moderate gastric body wall thickening similar.  Ill-definition of soft tissue planes in the lesser sac and porta hepatis new or progressive.  Development of intrahepatic duct dilatation.  Increase in small volume abdominal ascites with persistent peritoneal metastasis.  New trace left pleural fluid.  New T8 heterogeneous sclerosis and vertebral body height loss. Cycle 1 FOLFOX plus zolbetuximab beginning 11/14/2023 (zolbetuximab 11/14/2023, FOLFOX 11/15/2023) Cycle 2 FOLFOX plus zolbetuximab (zolbetuximab 11/28/23, FOLFOX anticipated 11/29/23) Cycle 3 FOLFOX plus zolbetuximab 12/12/2023 Cycle 4 FOLFOX plus zolbetuximab 12/27/2023 CT abdomen/pelvis 01/01/2024: Improved lower lobe pulmonary embolism, stable gastric wall thickening, infiltrative density in the lesser sac region, tumor caking along the omentum and mesentery-stable, reduced anterior ascites stable 1.1 cm segment 4 liver lesion Truncal rash-status post recent punch biopsy with pathology pending; biopsy reported negative per family 04/26/2022 Lichen planus of the feet Hypothyroidism Atrial fibrillation Admission 07/24/2022 with acute bilateral pulmonary embolism/right heart strain Heparin  anticoagulation 07/24/2022 Doppler 07/24/2022-acute DVT of the right femoral, popliteal, posterior tibial, and peroneal veins, acute left posterior tibial DVT Lovenox  anticoagulation, changed to once daily dosing 05/25/2023 Lovenox  changed to 40 mg daily 08/30/2023 7.  Anemia secondary to chemotherapy, phlebotomy, and hemoptysis-improved 8.  L1 compression fracture on plain x-ray 12/18/2022 MRI lumbar spine 02/02/2023-acute/subacute L1 compression fracture, mild  degenerative change of the lumbar spine 9.  Admission 08/14/2023 with hematemesis 10. C. Difficile positive by PCR-completed course of Vancomycin  11.  Admission 12/29/2023 with bilateral pulmonary embolism-heparin  Dopplers 01/01/2024: Acute right common femoral and popliteal DVT 01/03/2024 heparin  discontinued, Lovenox  60 mg twice daily 12.  Diffuse pruritic rash-etiology?  Improved   Todd Harrison is admitted with recurrent pulmonary embolism while maintained on Lovenox  anticoagulation.  I reviewed the chest CT images.  He resumed Lovenox  at a prophylactic dose on 08/30/2023 after admission with GI bleeding in early April 2025.  He has been maintained on heparin  anticoagulation since hospital admission.  Pulmonary emboli were improved on the CT abdomen/pelvis 01/01/2024.  Anticoagulation was changed to Lovenox  yesterday.  He understands the risk of bleeding and recurrent venous thromboembolic disease.  He agrees to continue Lovenox .    The CT abdomen/pelvis 01/01/2024 revealed no evidence of disease progression and reduced ascites.  We discussed the indication for continuing chemotherapy versus comfort care.  He will continue discussions with his family and return for an office visit as scheduled next week.   Todd Harrison has advanced metastatic gastric cancer presenting with carcinomatosis in December 2023.  The goals of systemic therapy are to palliate symptoms and potentially extend survival.  No therapy will be curative.    The rash has resolved.  I doubt the rash is related to chemotherapy as he completed 3 cycles without development of a rash.    He appears stable for discharge from an oncology standpoint.  Recommendations: Continue Lovenox  Continue discussions with Todd Harrison regarding continuation of FOLFOX/zolbetuximab versus hospice Continue TPN Follow-up as scheduled at the Cancer center 01/09/2024.     LOS: 5 days   Arley Hof, MD   01/04/2024, 8:09 AM

## 2024-01-04 NOTE — Progress Notes (Signed)
   01/04/24 1000  Spiritual Encounters  Type of Visit Initial  Care provided to: Pt and family  Conversation partners present during encounter Other (comment) Gaffer visit)  Referral source Other (comment) (community Chaplain visit request)  Reason for visit Routine spiritual support  Spiritual Framework  Presenting Themes Meaning/purpose/sources of inspiration;Significant life change;Values and beliefs;Coping tools;Impactful experiences and emotions;Courage hope and growth;Community and relationships  Community/Connection Spiritual leader;Faith community  Patient Stress Factors Health changes;Major life changes  Family Stress Factors Major life changes;Other (Comment) (Anticipatory Grief)  Intervention Outcomes  Outcomes Connection to spiritual care;Awareness around self/spiritual resourses;Awareness of support   Commnity Chaplain visit for patient who was requesting visit from community.  Met with wife and patient prior to discharge  Provided spiritual care and emotional support.    Respectfully submitted,   Rev. Darice Dennis Molt

## 2024-01-04 NOTE — Plan of Care (Signed)

## 2024-01-04 NOTE — Discharge Summary (Signed)
 Physician Discharge Summary  Todd Harrison FMW:[id number] DOB: May 06, 1946 DOA: 12/29/2023  PCP: Henry Ingle, MD  Admit date: 12/29/2023 Discharge date: 01/04/2024  Admitted From: Home Disposition: Home with home health, TPN  Recommendations for Outpatient Follow-up:  Follow up with PCP in 1-2 weeks Oncology clinic to schedule follow-up.  TPN and labs to be followed by pharmacy.  Home Health: PT/OT/RN Equipment/Devices: Walker  Discharge Condition: Fair CODE STATUS: DNR with intervention Diet recommendation: Regular diet as tolerated, TPN  Discharge summary: 78 year old gentleman with history of metastatic gastric cancer currently on chemotherapy, paroxysmal A-fib, bilateral pulmonary embolism with concurrent DVT who was on prophylactic dose of Lovenox  at home 40 mg daily, previous upper GI bleeding and hemorrhagic shock requiring ICU management, chronic diastolic heart failure, anemia of chronic disease and chronic diarrhea admitted secondary to cough and hemoptysis, pleuritic chest pain.  Patient was found to have bilateral lower lobe PE without evidence of right heart strain.  Also with evidence of streaky hemoptysis. Patient has been started on heparin  infusion, has intermittent mild bleeding but no gross bleeding. After challenging with heparin  for 72 hours, patient was started on Lovenox  and he is tolerating it.  He does have chronic small-volume hemoptysis.  Hemoglobin is stable.   # Acute pulmonary embolism without cor pulmonale, recurrent thromboembolism associated with malignant metastatic tumor.  Intolerance to anticoagulation:   Currently hemodynamically stable. Treated with heparin  and subsequently now with Lovenox .  Tolerating well.  No evidence of gross bleeding.  Keep on PPI prophylaxis.  Going home with Lovenox .  Patient and family will watch out for any evidence of bleeding and report quickly.   Metastatic gastric cancer, high risk of bleeding.  Cough with  hemoptysis: On PPI. Gastric malignancy with gastric mass, currently receiving chemotherapy with FOLFOX. Followed by oncology. Goal of care discussions ongoing.  Oncology following.   Chronic diastolic heart failure: Currently euvolemic.   GERD: On PPI as above.   Severe protein calorie malnutrition: Nutrition Status: Nutrition Problem: Severe Malnutrition Etiology: chronic illness, cancer and cancer related treatments Signs/Symptoms: moderate fat depletion, severe muscle depletion, percent weight loss Interventions: TPN, Refer to RD note for recommendations Continuing TPN at home.  Regular diet as tolerated but he does not eat as he does not have any test.     Goal of care discussion: Ongoing goal of care discussion.  Patient and family aware about poor prognosis.   Patient wants to continue treatment for now.  He wants to accept all interventions.  Going home with home health therapies.  Remains chronically sick.  High risk of readmission with complications related to gastric cancer as well as related to anticoagulation use.  Stable today to transition home with family.   Discharge Diagnoses:  Principal Problem:   Acute pulmonary embolism without acute cor pulmonale (HCC) Active Problems:   Paroxysmal atrial fibrillation (HCC)   Acquired hypothyroidism   Cough with hemoptysis   Pressure injury of skin   Chronic diarrhea   Thrombocytopenia (HCC)   Gastric cancer (HCC)   Chronic diastolic CHF (congestive heart failure) (HCC)   GERD (gastroesophageal reflux disease)   Anemia of chronic disease   Chronic anticoagulation   Goals of care, counseling/discussion   Acute pulmonary embolism (HCC)   Protein-calorie malnutrition, severe    Discharge Instructions  Discharge Instructions     Diet general   Complete by: As directed    Discharge wound care:   Complete by: As directed    Cleanse sacral/buttocks wounds with Vashe  wound cleanser, do not rinse. Apply Xeroform  gauze ([facility] [phone]) to wound beds daily and cover with silicone foam to secure   Increase activity slowly   Complete by: As directed       Allergies as of 01/04/2024       Reactions   Gold Itching   Mixed Grasses    unknown   Hydroxyquinolines Rash        Medication List     STOP taking these medications    mirtazapine  7.5 MG tablet Commonly known as: REMERON    potassium chloride  10 MEQ tablet Commonly known as: Klor-Con  M10       TAKE these medications    acetaminophen  500 MG tablet Commonly known as: TYLENOL  Take 500 mg by mouth every 6 (six) hours as needed for mild pain (pain score 1-3).   acitretin  25 MG capsule Commonly known as: SORIATANE  Take 25 mg by mouth daily.   benzonatate  200 MG capsule Commonly known as: TESSALON  Take 1 capsule (200 mg total) by mouth 3 (three) times daily as needed for cough.   clobetasol  ointment 0.05 % Commonly known as: TEMOVATE  Apply 1 Application topically 2 (two) times daily as needed (feet).   dexamethasone  4 MG tablet Commonly known as: DECADRON  Take 2 tablets (8 mg total) by mouth daily. Start on the day after zolbetuximab for 3 days. Take with food.   enoxaparin  60 MG/0.6ML injection Commonly known as: LOVENOX  Inject 0.6 mLs (60 mg total) into the skin every 12 (twelve) hours. What changed:  medication strength how much to take when to take this   gabapentin  300 MG capsule Commonly known as: NEURONTIN  Take 1 capsule (300 mg total) by mouth at bedtime.   guaiFENesin  600 MG 12 hr tablet Commonly known as: MUCINEX  Take 1 tablet (600 mg total) by mouth 2 (two) times daily.   hydrOXYzine  25 MG tablet Commonly known as: ATARAX  Take 1 tablet (25 mg total) by mouth every 4 (four) hours as needed for itching. What changed: when to take this   levothyroxine  112 MCG tablet Commonly known as: SYNTHROID  Take 112 mcg by mouth daily before breakfast.   lidocaine -prilocaine  cream Commonly known as: EMLA  Apply 1  Application topically as needed (Apply 1 hour before coming to [facility] for accessing).   OLANZapine  5 MG tablet Commonly known as: ZyPREXA  Take 1 tablet (5 mg total) by mouth at bedtime. Start on the day after zolbetuximab for 3 days.   ondansetron  8 MG tablet Commonly known as: ZOFRAN  Take 1 tablet (8 mg total) by mouth every 8 (eight) hours as needed for nausea or vomiting.   pantoprazole  40 MG tablet Commonly known as: Protonix  Take 1 tablet (40 mg total) by mouth 2 (two) times daily.   polyethylene glycol 17 g packet Commonly known as: MIRALAX  / GLYCOLAX  Take 17 g by mouth daily as needed for moderate constipation.   prochlorperazine  10 MG tablet Commonly known as: COMPAZINE  Take 1 tablet (10 mg total) by mouth every 6 (six) hours as needed for nausea or vomiting.   triamcinolone  cream 0.1 % Commonly known as: KENALOG  Apply 1 application  topically daily as needed (lichen planus flare).               Discharge Care Instructions  (From admission, onward)           Start     Ordered   01/04/24 0000  Discharge wound care:       Comments: Cleanse sacral/buttocks wounds with  Vashe wound cleanser, do not rinse. Apply Xeroform gauze ([facility] [phone]) to wound beds daily and cover with silicone foam to secure   01/04/24 0914            Follow-up Information     [facility] Follow up.   Why: Holley Herring [id number] TPN        [facility] Follow up.   Specialty: Home Health Services Why: HHPT/OT Contact information: [address] [place] [address] [phone]                Allergies  Allergen Reactions   Gold Itching   Mixed Grasses     unknown   Hydroxyquinolines Rash    Consultations: Oncology Palliative care   Procedures/Studies: CT ABDOMEN PELVIS W CONTRAST Result Date: 01/02/2024 CLINICAL DATA:  Metastatic gastric cancer, active chemotherapy * Tracking Code: BO * EXAM: CT ABDOMEN AND PELVIS WITH  CONTRAST TECHNIQUE: Multidetector CT imaging of the abdomen and pelvis was performed using the standard protocol following bolus administration of intravenous contrast. RADIATION DOSE REDUCTION: This exam was performed according to the departmental dose-optimization program which includes automated exposure control, adjustment of the mA and/or kV according to patient size and/or use of iterative reconstruction technique. CONTRAST:  OMNIPAQUE  IOHEXOL  300 MG/ML  SOLN COMPARISON:  CTA chest 12/29/2023 and CT scan 10/31/2023 FINDINGS: Lower chest: Improved pulmonary embolus in the lower lobes compared 12/29/2023. Small bilateral pleural effusions. Aortic and coronary atherosclerosis with aortic valve calcification. Hepatobiliary: 1.1 cm hypodense lesion in segment 4a of the liver on image 31 series 2, stable from 10/31/2023, but otherwise nonspecific. Fluid density 4.8 by 4.2 cm cyst in the right hepatic lobe on image 48 series 2, stable. Mild intrahepatic biliary dilatation persists. Infiltrative density potentially representing tumor in the porta hepatis with poor delineation of the common bile duct. Pancreas: Infiltrative density in the lesser sac region partially obscures interfaces between the pancreas in the stomach, and may represent tumor. Spleen: Unremarkable Adrenals/Urinary Tract: Unremarkable Stomach/Bowel: Wall thickening in the gastric body on image 43 series 4 similar to prior exam. Suspected tumor caking along the omentum and mesentery adjacent to loops of bowel without current findings of obstruction. Sigmoid colon diverticulosis. Air fluid level in the rectum may indicate diarrheal process. Vascular/Lymphatic: Atherosclerosis is present, including aortoiliac atherosclerotic disease. Difficult to exclude porta hepatis or peripancreatic adenopathy given the surrounding infiltrative process which is likely from tumor. Right gastric node 1.0 cm in short axis on image 37 series 2. Reproductive:  Unremarkable Other: Small amount of ascites with enhancement along the paracolic gutters with nodular components compatible with peritoneal tumor spread. Nodularity and bandlike thickening along the omentum and in the mesentery compatible with infiltrative tumor. Mild presacral edema. The amount of ascites anteriorly in the abdomen is reduced compared to prior exam from 10/31/2023. Scattered nodularity in the anterior subcutaneous tissues is probably injection related. Musculoskeletal: Old healed medial right rib fractures. Bridging spurring of both sacroiliac joints. Remote compression fractures at T8 and L1 appear unchanged. IMPRESSION: 1. Improved pulmonary embolus in the lower lobes compared to 12/29/2023. 2. Small bilateral pleural effusions. 3. Infiltrative density in the lesser sac region partially obscures interfaces between the pancreas and the stomach, and may represent tumor. 4. Wall thickening in the gastric body similar to prior exam. 5. Suspected tumor caking along the omentum and mesentery adjacent to loops of bowel without current findings of obstruction. This tumor caking is roughly similar to prior although there  is reduced anterior ascites in the abdomen. 6. Small amount of ascites with enhancement along the paracolic gutters with nodular components compatible with peritoneal tumor spread. 7. 1.1 cm hypodense lesion in segment 4a of the liver, stable from 10/31/2023, but otherwise nonspecific. 8. Air fluid level in the rectum may indicate diarrheal process. 9.  Aortic Atherosclerosis (ICD10-I70.0). Electronically Signed   By: Ryan Salvage M.D.   On: 01/02/2024 08:58   VAS US  LOWER EXTREMITY VENOUS (DVT) Result Date: 01/01/2024  Lower Venous DVT Study Patient Name:  Todd Harrison  Date of Exam:   01/01/2024 Medical Rec #: [id number]        Accession #:    [id number] Date of Birth: 07-25-1945       Patient Gender: M Patient Age:   5 years Exam Location:  [facility] Procedure:       VAS US  LOWER EXTREMITY VENOUS (DVT) Referring Phys: ARLEY HOF --------------------------------------------------------------------------------  Indications: Pulmonary embolism.  Comparison Study: 07/25/2022 Rt Femoral vein DVT and Lt PTV DVT Performing Technologist: Elmarie Lindau, RVT  Examination Guidelines: A complete evaluation includes B-mode imaging, spectral Doppler, color Doppler, and power Doppler as needed of all accessible portions of each vessel. Bilateral testing is considered an integral part of a complete examination. Limited examinations for reoccurring indications may be performed as noted. The reflux portion of the exam is performed with the patient in reverse Trendelenburg.  +---------+---------------+---------+-----------+----------+--------------+ RIGHT    CompressibilityPhasicitySpontaneityPropertiesThrombus Aging +---------+---------------+---------+-----------+----------+--------------+ CFV      Partial                                      Acute          +---------+---------------+---------+-----------+----------+--------------+ SFJ      Full                                                        +---------+---------------+---------+-----------+----------+--------------+ FV Prox  Full                                                        +---------+---------------+---------+-----------+----------+--------------+ FV Mid   Partial                                      Chronic        +---------+---------------+---------+-----------+----------+--------------+ FV DistalFull                                                        +---------+---------------+---------+-----------+----------+--------------+ PFV      Full                                                        +---------+---------------+---------+-----------+----------+--------------+  POP      None                                         Acute           +---------+---------------+---------+-----------+----------+--------------+ PTV      Full                                                        +---------+---------------+---------+-----------+----------+--------------+ PERO     Full                                                        +---------+---------------+---------+-----------+----------+--------------+   +---------+---------------+---------+-----------+----------+--------------+ LEFT     CompressibilityPhasicitySpontaneityPropertiesThrombus Aging +---------+---------------+---------+-----------+----------+--------------+ CFV      Full           Yes      Yes                                 +---------+---------------+---------+-----------+----------+--------------+ SFJ      Full                                                        +---------+---------------+---------+-----------+----------+--------------+ FV Prox  Full                                                        +---------+---------------+---------+-----------+----------+--------------+ FV Mid   Full                                                        +---------+---------------+---------+-----------+----------+--------------+ FV DistalFull                                                        +---------+---------------+---------+-----------+----------+--------------+ PFV      Full                                                        +---------+---------------+---------+-----------+----------+--------------+ POP      Full           Yes      Yes                                 +---------+---------------+---------+-----------+----------+--------------+  PTV      Full                                                        +---------+---------------+---------+-----------+----------+--------------+ PERO     Full                                                         +---------+---------------+---------+-----------+----------+--------------+     Summary: RIGHT: - Findings consistent with acute deep vein thrombosis involving the right common femoral vein, and right popliteal vein.  - Findings consistent with chronic deep vein thrombosis involving the right femoral vein.  - No cystic structure found in the popliteal fossa.  LEFT: - There is no evidence of deep vein thrombosis in the lower extremity.  - No cystic structure found in the popliteal fossa.  *See table(s) above for measurements and observations. Electronically signed by Gaile New MD on 01/01/2024 at 7:18:13 PM.    Final    ECHOCARDIOGRAM COMPLETE Result Date: 12/30/2023    ECHOCARDIOGRAM REPORT   Patient Name:   Todd Harrison Date of Exam: 12/30/2023 Medical Rec #:  [id number]       Height:       71.0 in Accession #:    [id number]      Weight:       200.0 lb Date of Birth:  [birth year]      BSA:          2.109 m Patient Age:    77 years        BP:           96/59 mmHg Patient Gender: M               HR:           58 bpm. Exam Location:  Inpatient Procedure: 2D Echo, Cardiac Doppler and Color Doppler (Both Spectral and Color            Flow Doppler were utilized during procedure). Indications:    CHF  History:        Patient has prior history of Echocardiogram examinations, most                 recent 07/25/2022. Arrythmias:Atrial Fibrillation; Risk                 Factors:Sleep Apnea.  Sonographer:    Jayson Gaskins Referring Phys: [id number] JUSTIN B HOWERTER IMPRESSIONS  1. Left ventricular ejection fraction, by estimation, is 55 to 60%. The left ventricle has normal function. The left ventricle has no regional wall motion abnormalities. There is moderate left ventricular hypertrophy of the basal-septal segment. Left ventricular diastolic parameters are consistent with Grade I diastolic dysfunction (impaired relaxation).  2. Right ventricular systolic function is normal. The right ventricular size is normal.  3. Left  atrial size was severely dilated.  4. Right atrial size was severely dilated.  5. The mitral valve is normal in structure. Trivial mitral valve regurgitation.  6. The aortic valve is tricuspid. Aortic valve regurgitation is not visualized. Moderate aortic valve stenosis. Aortic valve area, by VTI measures 1.01 cm. Aortic valve mean gradient measures 28.0  mmHg. Aortic valve Vmax measures 3.47 m/s. Peak gradient 48.2 mmHg, DI 0.32.  7. Aortic dilatation noted. There is mild dilatation of the aortic root, measuring 40 mm. There is mild dilatation of the ascending aorta, measuring 41 mm. Comparison(s): Changes from prior study are noted. 07/25/2022: LVEF 55-60%, mild AS, MG 15 mmHg, dilated aorta to 40 mm. FINDINGS  Left Ventricle: Left ventricular ejection fraction, by estimation, is 55 to 60%. The left ventricle has normal function. The left ventricle has no regional wall motion abnormalities. The left ventricular internal cavity size was normal in size. There is  moderate left ventricular hypertrophy of the basal-septal segment. Left ventricular diastolic parameters are consistent with Grade I diastolic dysfunction (impaired relaxation). Indeterminate filling pressures. Right Ventricle: The right ventricular size is normal. No increase in right ventricular wall thickness. Right ventricular systolic function is normal. Left Atrium: Left atrial size was severely dilated. Right Atrium: Right atrial size was severely dilated. Pericardium: There is no evidence of pericardial effusion. Mitral Valve: The mitral valve is normal in structure. Trivial mitral valve regurgitation. Tricuspid Valve: The tricuspid valve is grossly normal. Tricuspid valve regurgitation is mild. Aortic Valve: The aortic valve is tricuspid. Aortic valve regurgitation is not visualized. Moderate aortic stenosis is present. Aortic valve mean gradient measures 28.0 mmHg. Aortic valve peak gradient measures 48.2 mmHg. Aortic valve area, by VTI measures  1.01 cm. Pulmonic Valve: The pulmonic valve was grossly normal. Pulmonic valve regurgitation is trivial. Aorta: Aortic dilatation noted. There is mild dilatation of the aortic root, measuring 40 mm. There is mild dilatation of the ascending aorta, measuring 41 mm. Venous: The inferior vena cava was not well visualized. IAS/Shunts: No atrial level shunt detected by color flow Doppler.  LEFT VENTRICLE PLAX 2D LVIDd:         5.00 cm   Diastology LVIDs:         3.50 cm   LV e' medial:    6.96 cm/s LV PW:         1.30 cm   LV E/e' medial:  12.9 LV IVS:        1.10 cm   LV e' lateral:   11.20 cm/s LVOT diam:     2.00 cm   LV E/e' lateral: 8.0 LV SV:         83 LV SV Index:   39 LVOT Area:     3.14 cm  RIGHT VENTRICLE RV S prime:     18.90 cm/s TAPSE (M-mode): 2.9 cm LEFT ATRIUM              Index        RIGHT ATRIUM           Index LA Vol (A2C):   92.1 ml  43.68 ml/m  RA Area:     31.80 cm LA Vol (A4C):   106.0 ml 50.27 ml/m  RA Volume:   125.00 ml 59.28 ml/m LA Biplane Vol: 99.1 ml  47.00 ml/m  AORTIC VALVE AV Area (Vmax):    0.94 cm AV Area (Vmean):   0.92 cm AV Area (VTI):     1.01 cm AV Vmax:           347.00 cm/s AV Vmean:          252.000 cm/s AV VTI:            0.822 m AV Peak Grad:      48.2 mmHg AV Mean Grad:      28.0 mmHg  LVOT Vmax:         104.00 cm/s LVOT Vmean:        74.100 cm/s LVOT VTI:          0.265 m LVOT/AV VTI ratio: 0.32  AORTA Ao Root diam: 4.00 cm MITRAL VALVE               TRICUSPID VALVE MV Area (PHT): 3.15 cm    TR Peak grad:   21.7 mmHg MV Decel Time: 241 msec    TR Vmax:        233.00 cm/s MV E velocity: 90.00 cm/s MV A velocity: 83.10 cm/s  SHUNTS MV E/A ratio:  1.08        Systemic VTI:  0.26 m                            Systemic Diam: 2.00 cm Vinie Maxcy MD Electronically signed by Vinie Maxcy MD Signature Date/Time: 12/30/2023/10:14:51 AM    Final    CT Angio Chest PE W and/or Wo Contrast Result Date: 12/29/2023 CLINICAL DATA:  Concern for embolism. EXAM: CT ANGIOGRAPHY  CHEST WITH CONTRAST TECHNIQUE: Multidetector CT imaging of the chest was performed using the standard protocol during bolus administration of intravenous contrast. Multiplanar CT image reconstructions and MIPs were obtained to evaluate the vascular anatomy. RADIATION DOSE REDUCTION: This exam was performed according to the departmental dose-optimization program which includes automated exposure control, adjustment of the mA and/or kV according to patient size and/or use of iterative reconstruction technique. CONTRAST:  80mL OMNIPAQUE  IOHEXOL  350 MG/ML SOLN COMPARISON:  Chest CT dated 10/31/2023. FINDINGS: Cardiovascular: There is no cardiomegaly or pericardial effusion. There is coronary vascular calcification. Mild atherosclerotic calcification of the thoracic aorta. No aneurysmal dilatation or dissection. Bilateral lower lobe pulmonary artery emboli involving the lobar and segmental branches. No evidence of right heart straining. Right-sided Port-A-Cath with tip at the cavoatrial junction. Mediastinum/Nodes: Mildly enlarged right hilar lymph node measures 13 mm short axis. No mediastinal adenopathy. The esophagus is grossly unremarkable. No mediastinal fluid collection. Lungs/Pleura: Trace left pleural effusion. There are bibasilar subpleural atelectasis. Pneumonia is not excluded. There is no pneumothorax. The central airways are patent. Upper Abdomen: Better evaluated on CT of 10/31/2023. Musculoskeletal: Osteopenia with degenerative changes spine. Similar appearance of T8 compression fracture. No new fracture. Review of the MIP images confirms the above findings. IMPRESSION: 1. Bilateral lower lobe pulmonary artery emboli. No evidence of right heart straining. 2. Trace left pleural effusion and bibasilar subpleural atelectasis. Pneumonia is not excluded. 3.  Aortic Atherosclerosis (ICD10-I70.0). These results were called by telephone at the time of interpretation on 12/29/2023 at 4:55 pm to provider [facility] , who verbally acknowledged these results. Electronically Signed   By: Vanetta Chou M.D.   On: 12/29/2023 17:00   (Echo, Carotid, EGD, Colonoscopy, ERCP)    Subjective: Patient seen and examined.  He had streaky hemoptysis last night again with some itchiness on his throat.  No other complaints.  Agreeable with plans to go home.  Questions answered.   Discharge Exam: Vitals:   01/03/24 2011 01/04/24 0322  BP: 101/64 100/61  Pulse: 71 84  Resp: 19 19  Temp: 98.7 F (37.1 C) 98.3 F (36.8 C)  SpO2: 96% 97%   Vitals:   01/03/24 1348 01/03/24 1400 01/03/24 2011 01/04/24 0322  BP: (!) 94/58 (!) 94/58 101/64 100/61  Pulse: 95 94 71 84  Resp: 20  19 19  Temp: 98.4 F (36.9 C)  98.7 F (37.1 C) 98.3 F (36.8 C)  TempSrc: Oral  Oral Oral  SpO2: 100% 99% 96% 97%  Weight:      Height:        General: Pt is alert, awake, not in acute distress.  Frail.  Pleasant interaction. Cardiovascular: RRR, S1/S2 +, no rubs, no gallops, Port-A-Cath present right subclavian. Respiratory: CTA bilaterally, no wheezing, no rhonchi Abdominal: Soft, NT, ND, bowel sounds + Extremities: 1+ edema both upper extremities.  Ecchymosis present.    The results of significant diagnostics from this hospitalization (including imaging, microbiology, ancillary and laboratory) are listed below for reference.     Microbiology: Recent Results (from the past 240 hours)  Resp panel by RT-PCR (RSV, Flu A&B, Covid) Anterior Nasal Swab     Status: None   Collection Time: 12/29/23  3:16 PM   Specimen: Anterior Nasal Swab  Result Value Ref Range Status   SARS Coronavirus 2 by RT PCR NEGATIVE NEGATIVE Final    Comment: (NOTE) SARS-CoV-2 target nucleic acids are NOT DETECTED.  The SARS-CoV-2 RNA is generally detectable in upper respiratory specimens during the acute phase of infection. The lowest concentration of SARS-CoV-2 viral copies this assay can detect is 138 copies/mL. A negative result does  not preclude SARS-Cov-2 infection and should not be used as the sole basis for treatment or other patient management decisions. A negative result may occur with  improper specimen collection/handling, submission of specimen other than nasopharyngeal swab, presence of viral mutation(s) within the areas targeted by this assay, and inadequate number of viral copies(<138 copies/mL). A negative result must be combined with clinical observations, patient history, and epidemiological information. The expected result is Negative.  Fact Sheet for Patients:  BloggerCourse.com  Fact Sheet for Healthcare Providers:  SeriousBroker.it  This test is no t yet approved or cleared by the [place]  FDA and  has been authorized for detection and/or diagnosis of SARS-CoV-2 by FDA under an Emergency Use Authorization (EUA). This EUA will remain  in effect (meaning this test can be used) for the duration of the COVID-19 declaration under Section 564(b)(1) of the Act, 21 U.S.C.section 360bbb-3(b)(1), unless the authorization is terminated  or revoked sooner.       Influenza A by PCR NEGATIVE NEGATIVE Final   Influenza B by PCR NEGATIVE NEGATIVE Final    Comment: (NOTE) The Xpert Xpress SARS-CoV-2/FLU/RSV plus assay is intended as an aid in the diagnosis of influenza from Nasopharyngeal swab specimens and should not be used as a sole basis for treatment. Nasal washings and aspirates are unacceptable for Xpert Xpress SARS-CoV-2/FLU/RSV testing.  Fact Sheet for Patients: BloggerCourse.com  Fact Sheet for Healthcare Providers: SeriousBroker.it  This test is not yet approved or cleared by the [place]  FDA and has been authorized for detection and/or diagnosis of SARS-CoV-2 by FDA under an Emergency Use Authorization (EUA). This EUA will remain in effect (meaning this test can be used) for the  duration of the COVID-19 declaration under Section 564(b)(1) of the Act, 21 U.S.C. section 360bbb-3(b)(1), unless the authorization is terminated or revoked.     Resp Syncytial Virus by PCR NEGATIVE NEGATIVE Final    Comment: (NOTE) Fact Sheet for Patients: BloggerCourse.com  Fact Sheet for Healthcare Providers: SeriousBroker.it  This test is not yet approved or cleared by the [place]  FDA and has been authorized for detection and/or diagnosis of SARS-CoV-2 by FDA under an Emergency Use Authorization (EUA). This EUA will remain in effect (  meaning this test can be used) for the duration of the COVID-19 declaration under Section 564(b)(1) of the Act, 21 U.S.C. section 360bbb-3(b)(1), unless the authorization is terminated or revoked.  Performed at [facility], [address], [place], [place] [address]      Labs: BNP (last 3 results) Recent Labs    12/30/23 0152  BNP 116.1*   Basic Metabolic Panel: Recent Labs  Lab 12/31/23 1034 01/01/24 0523 01/02/24 0533 01/03/24 0502 01/04/24 0540  NA 135 138 137 138 137  K 3.6 3.8 3.9 4.0 4.6  CL 105 109 108 108 107  CO2 22 23 19* 20* 21*  GLUCOSE 98 116* 106* 112* 116*  BUN 11 12 13 15 18   CREATININE 0.58* 0.59* 0.57* 0.62 0.62  CALCIUM  8.0* 7.9* 8.0* 8.2* 8.2*  MG 1.9 2.1 2.2 2.0 2.1  PHOS 1.9* 2.9 2.1* 3.3 3.1   Liver Function Tests: Recent Labs  Lab 12/29/23 1544 12/30/23 0152 12/31/23 1034 01/03/24 0502  AST 20 12* 12* 35  ALT 28 20 16 23   ALKPHOS 388* 252* 210* 183*  BILITOT 0.6 0.7 0.7 0.3  PROT 5.3* 4.8* 5.0* 4.9*  ALBUMIN 2.8* 1.8* 2.0* 2.6*   No results for input(s): LIPASE, AMYLASE in the last 168 hours. No results for input(s): AMMONIA in the last 168 hours. CBC: Recent Labs  Lab 12/31/23 1034 01/01/24 0523 01/02/24 0533 01/03/24 0502 01/03/24 1555 01/04/24 0540  WBC 3.2* 2.8* 3.0* 4.1  --  4.8  NEUTROABS 2.1  1.7 1.3* 1.9  --  2.1  HGB 9.5* 8.6* 8.7* 9.0* 9.5* 9.3*  HCT 31.0* 27.3* 28.0* 30.0* 30.2* 30.5*  MCV 94.5 92.2 92.7 92.3  --  91.9  PLT 116* 131* 143* 176  --  187   Cardiac Enzymes: No results for input(s): CKTOTAL, CKMB, CKMBINDEX, TROPONINI in the last 168 hours. BNP: Invalid input(s): POCBNP CBG: Recent Labs  Lab 12/30/23 1811 12/30/23 2028 12/30/23 2336 12/31/23 0616 12/31/23 1130  GLUCAP 84 106* 90 95 97   D-Dimer No results for input(s): DDIMER in the last 72 hours. Hgb A1c No results for input(s): HGBA1C in the last 72 hours. Lipid Profile No results for input(s): CHOL, HDL, LDLCALC, TRIG, CHOLHDL, LDLDIRECT in the last 72 hours. Thyroid  function studies No results for input(s): TSH, T4TOTAL, T3FREE, THYROIDAB in the last 72 hours.  Invalid input(s): FREET3 Anemia work up No results for input(s): VITAMINB12, FOLATE, FERRITIN, TIBC, IRON, RETICCTPCT in the last 72 hours. Urinalysis    Component Value Date/Time   COLORURINE YELLOW 12/04/2022 1240   APPEARANCEUR CLEAR 12/04/2022 1240   LABSPEC 1.024 12/04/2022 1240   PHURINE 6.5 12/04/2022 1240   GLUCOSEU NEGATIVE 12/04/2022 1240   HGBUR NEGATIVE 12/04/2022 1240   BILIRUBINUR NEGATIVE 12/04/2022 1240   BILIRUBINUR negative 08/10/2016 1207   KETONESUR NEGATIVE 12/04/2022 1240   PROTEINUR TRACE (A) 12/04/2022 1240   UROBILINOGEN 1.0 08/10/2016 1207   NITRITE NEGATIVE 12/04/2022 1240   LEUKOCYTESUR NEGATIVE 12/04/2022 1240   Sepsis Labs Recent Labs  Lab 01/01/24 0523 01/02/24 0533 01/03/24 0502 01/04/24 0540  WBC 2.8* 3.0* 4.1 4.8   Microbiology Recent Results (from the past 240 hours)  Resp panel by RT-PCR (RSV, Flu A&B, Covid) Anterior Nasal Swab     Status: None   Collection Time: 12/29/23  3:16 PM   Specimen: Anterior Nasal Swab  Result Value Ref Range Status   SARS Coronavirus 2 by RT PCR NEGATIVE NEGATIVE Final    Comment: (NOTE) SARS-CoV-2  target  nucleic acids are NOT DETECTED.  The SARS-CoV-2 RNA is generally detectable in upper respiratory specimens during the acute phase of infection. The lowest concentration of SARS-CoV-2 viral copies this assay can detect is 138 copies/mL. A negative result does not preclude SARS-Cov-2 infection and should not be used as the sole basis for treatment or other patient management decisions. A negative result may occur with  improper specimen collection/handling, submission of specimen other than nasopharyngeal swab, presence of viral mutation(s) within the areas targeted by this assay, and inadequate number of viral copies(<138 copies/mL). A negative result must be combined with clinical observations, patient history, and epidemiological information. The expected result is Negative.  Fact Sheet for Patients:  BloggerCourse.com  Fact Sheet for Healthcare Providers:  SeriousBroker.it  This test is no t yet approved or cleared by the [place]  FDA and  has been authorized for detection and/or diagnosis of SARS-CoV-2 by FDA under an Emergency Use Authorization (EUA). This EUA will remain  in effect (meaning this test can be used) for the duration of the COVID-19 declaration under Section 564(b)(1) of the Act, 21 U.S.C.section 360bbb-3(b)(1), unless the authorization is terminated  or revoked sooner.       Influenza A by PCR NEGATIVE NEGATIVE Final   Influenza B by PCR NEGATIVE NEGATIVE Final    Comment: (NOTE) The Xpert Xpress SARS-CoV-2/FLU/RSV plus assay is intended as an aid in the diagnosis of influenza from Nasopharyngeal swab specimens and should not be used as a sole basis for treatment. Nasal washings and aspirates are unacceptable for Xpert Xpress SARS-CoV-2/FLU/RSV testing.  Fact Sheet for Patients: BloggerCourse.com  Fact Sheet for Healthcare  Providers: SeriousBroker.it  This test is not yet approved or cleared by the [place]  FDA and has been authorized for detection and/or diagnosis of SARS-CoV-2 by FDA under an Emergency Use Authorization (EUA). This EUA will remain in effect (meaning this test can be used) for the duration of the COVID-19 declaration under Section 564(b)(1) of the Act, 21 U.S.C. section 360bbb-3(b)(1), unless the authorization is terminated or revoked.     Resp Syncytial Virus by PCR NEGATIVE NEGATIVE Final    Comment: (NOTE) Fact Sheet for Patients: BloggerCourse.com  Fact Sheet for Healthcare Providers: SeriousBroker.it  This test is not yet approved or cleared by the [place]  FDA and has been authorized for detection and/or diagnosis of SARS-CoV-2 by FDA under an Emergency Use Authorization (EUA). This EUA will remain in effect (meaning this test can be used) for the duration of the COVID-19 declaration under Section 564(b)(1) of the Act, 21 U.S.C. section 360bbb-3(b)(1), unless the authorization is terminated or revoked.  Performed at [facility], [address], [place], [place] [address]      Time coordinating discharge: 40 minutes  SIGNED:   Renato Applebaum, MD  [facility] 01/04/2024, 10:35 AM

## 2024-01-04 NOTE — Progress Notes (Signed)
 PHARMACY - TOTAL PARENTERAL NUTRITION CONSULT NOTE   Indication: chronic TPN  Patient Measurements: Height: 5' 11 (180.3 cm) Weight: 97 kg (213 lb 13.5 oz) IBW/kg (Calculated) : 75.3 TPN AdjBW (KG): 90.7 Body mass index is 29.83 kg/m. Usual Weight:   Assessment:  Patient is a 78yo M with PMH of metastatic gastric cancer undergoing chemotherapy on chronic TPN since July 2025 due to reduced po intake. Patient presented on 12/29/23 from [facility] with c/o cough and hemoptysis and was subsequently found to have bilateral PE with no RHS and RLE DVT. Pharmacy has been consulted to dose TPN while inpatient.   Glucose / Insulin : No hx of DM - Not checking CBGs PTA - finger sticks & SSI stopped 8/25 per pt request - CBGs (goal <150): 116 from BMET Electrolytes: CO2 low but trending up with 21 today;  K is wnl at 4.6 but is trending up; other lytes wnl including CorrCa Renal: Scr stable (BL 1-1.1), BUN WNL Hepatic: AST/ALT, Tbili wnl;  Alk phos elevated but down to 183 - Albumin low 2.6 - TG 113 (8/25) Intake / Output; MIVF: no mIVF - I/O:  - 192 mL - UOP: 1600 ml - last BM: 8/27 GI Imaging: CT 6/25 - L hepatic lobe lesion suspicion, moderate gastric wall thickening. No gastric outlet obstruction GI Surgeries / Procedures:   Central access: chest port present on admission TPN start date: in patient: 12/30/2023 On TPN PTA  Nutritional Goals: Goal TPN rate is 85 mL/hr (provides ~ 112 g of protein and ~ 2240 kcals per day)  - 8/28: Obtained TPN formula record from [facility] (see media tab).  Patient was on cyclic 12 hr TPN PTA.   RD Assessment: Estimated Needs Total Energy Estimated Needs: 2200-2400 Total Protein Estimated Needs: 110-130 grams Total Fluid Estimated Needs: >/= 2 L  Current Nutrition:  Regular diet ordered but not eating d/t no taste TPN @ 43ml/hr Ensure High Protein DC'd 8/25 - pt refused all doses  Plan:   Today, at 1800 Continue 12 hr cyclic TPN (GIR 2.72 -  5.4) Electrolytes in TPN:  Na 5mEq/L Reduce K to 40mEq/L Ca 5mEq/L Mg 90mEq/L Phos 20 mmol/L Cl:Ac 1:2 Add standard MVI and trace elements to TPN SSI & CBGs stopped 8/25 per pt request  BMET on 8/30 Monitor TPN labs on Mon/Thurs  Iantha Batch, PharmD, BCPS 01/04/2024 7:52 AM

## 2024-01-04 NOTE — Plan of Care (Signed)
  Problem: Education: Goal: Knowledge of General Education information will improve Description: Including pain rating scale, medication(s)/side effects and non-pharmacologic comfort measures 01/04/2024 1151 by Terance Leonor BRAVO, RN Outcome: Adequate for Discharge 01/04/2024 1151 by Terance Leonor BRAVO, RN Outcome: Progressing   Problem: Health Behavior/Discharge Planning: Goal: Ability to manage health-related needs will improve 01/04/2024 1151 by Terance Leonor BRAVO, RN Outcome: Adequate for Discharge 01/04/2024 1151 by Terance Leonor BRAVO, RN Outcome: Progressing   Problem: Clinical Measurements: Goal: Ability to maintain clinical measurements within normal limits will improve 01/04/2024 1151 by Terance Leonor BRAVO, RN Outcome: Adequate for Discharge 01/04/2024 1151 by Terance Leonor BRAVO, RN Outcome: Progressing Goal: Will remain free from infection 01/04/2024 1151 by Terance Leonor BRAVO, RN Outcome: Adequate for Discharge 01/04/2024 1151 by Terance Leonor BRAVO, RN Outcome: Progressing Goal: Diagnostic test results will improve 01/04/2024 1151 by Terance Leonor BRAVO, RN Outcome: Adequate for Discharge 01/04/2024 1151 by Terance Leonor BRAVO, RN Outcome: Progressing Goal: Respiratory complications will improve 01/04/2024 1151 by Terance Leonor BRAVO, RN Outcome: Adequate for Discharge 01/04/2024 1151 by Terance Leonor BRAVO, RN Outcome: Progressing Goal: Cardiovascular complication will be avoided 01/04/2024 1151 by Terance Leonor BRAVO, RN Outcome: Adequate for Discharge 01/04/2024 1151 by Terance Leonor BRAVO, RN Outcome: Progressing   Problem: Activity: Goal: Risk for activity intolerance will decrease 01/04/2024 1151 by Terance Leonor BRAVO, RN Outcome: Adequate for Discharge 01/04/2024 1151 by Terance Leonor BRAVO, RN Outcome: Progressing   Problem: Nutrition: Goal: Adequate nutrition will be maintained 01/04/2024 1151 by Terance Leonor BRAVO, RN Outcome: Adequate for  Discharge 01/04/2024 1151 by Terance Leonor BRAVO, RN Outcome: Progressing   Problem: Coping: Goal: Level of anxiety will decrease 01/04/2024 1151 by Terance Leonor BRAVO, RN Outcome: Adequate for Discharge 01/04/2024 1151 by Terance Leonor BRAVO, RN Outcome: Progressing   Problem: Elimination: Goal: Will not experience complications related to bowel motility 01/04/2024 1151 by Terance Leonor BRAVO, RN Outcome: Adequate for Discharge 01/04/2024 1151 by Terance Leonor BRAVO, RN Outcome: Progressing Goal: Will not experience complications related to urinary retention 01/04/2024 1151 by Terance Leonor BRAVO, RN Outcome: Adequate for Discharge 01/04/2024 1151 by Terance Leonor BRAVO, RN Outcome: Progressing   Problem: Pain Managment: Goal: General experience of comfort will improve and/or be controlled 01/04/2024 1151 by Terance Leonor BRAVO, RN Outcome: Adequate for Discharge 01/04/2024 1151 by Terance Leonor BRAVO, RN Outcome: Progressing   Problem: Safety: Goal: Ability to remain free from injury will improve 01/04/2024 1151 by Terance Leonor BRAVO, RN Outcome: Adequate for Discharge 01/04/2024 1151 by Terance Leonor BRAVO, RN Outcome: Progressing   Problem: Skin Integrity: Goal: Risk for impaired skin integrity will decrease 01/04/2024 1151 by Terance Leonor BRAVO, RN Outcome: Adequate for Discharge 01/04/2024 1151 by Terance Leonor BRAVO, RN Outcome: Progressing

## 2024-01-04 NOTE — Telephone Encounter (Signed)
 Call from [facility] with [facility]. They will start PT/OT on 9/5

## 2024-01-04 NOTE — Progress Notes (Signed)
Discharge medications delivered to patient at bedside.

## 2024-01-04 NOTE — Plan of Care (Signed)

## 2024-01-04 NOTE — Progress Notes (Signed)
 PROGRESS NOTE    Todd Harrison  FMW:[id number] DOB: [birth year] DOA: 12/29/2023 PCP: Henry Ingle, MD  Late entry.  Note created for encounter 8/29.  Brief Narrative:  78 year old gentleman with history of metastatic gastric cancer currently on chemotherapy, paroxysmal A-fib, bilateral pulmonary embolism with concurrent DVT who was on prophylactic dose of Lovenox  at home 40 mg daily, previous upper GI bleeding and hemorrhagic shock requiring ICU management, chronic diastolic heart failure, anemia of chronic disease and chronic diarrhea admitted secondary to cough and hemoptysis, pleuritic chest pain.  Patient was found to have bilateral lower lobe PE without evidence of right heart strain.  Also with evidence of streaky hemoptysis. Patient has been started on heparin  infusion, has intermittent mild bleeding but no gross bleeding. Remains challenging to manage anticoagulation with high risk of rebleeding.  Followed by oncology and palliative care.  Subjective: Patient seen and examined.  He was apprehensive about the size of the needle on Lovenox .  Given first dose of Lovenox  and tolerated well.  Later in the day he had some streaky hemoptysis.  Assessment & Plan:   Acute pulmonary embolism without cor pulmonale, recurrent thromboembolism associated with malignant metastatic tumor.  Intolerance to anticoagulation:  Currently hemodynamically stable. Currently on heparin  and tolerating with streaky hemoptysis.  No evidence of gross  bleeding. Challenging with Lovenox  60 mg intramuscular twice daily today.  Will monitor next 24 hours.  Keep on PPI prophylaxis.  Likely home tomorrow.  Metastatic gastric cancer, high risk of bleeding.  Cough with hemoptysis: On PPI. Gastric malignancy with gastric mass, currently receiving chemotherapy with FOLFOX. Followed by oncology. Decadron , increase dose of hydroxyzine  for symptom relief and itching. Restaging CT scan done.  Results to be reviewed by  oncology. Goal of care discussions ongoing.  Oncology following.  Chronic diastolic heart failure: Currently euvolemic.  GERD: On PPI as above.  Severe protein calorie malnutrition: Nutrition Status: Nutrition Problem: Severe Malnutrition Etiology: chronic illness, cancer and cancer related treatments Signs/Symptoms: moderate fat depletion, severe muscle depletion, percent weight loss Interventions: TPN, Refer to RD note for recommendations    Goal of care discussion: Ongoing goal of care discussion.  Patient and family aware about poor prognosis.   Patient wants to continue treatment for now.  He wants to accept all interventions.  DVT prophylaxis: Heparin  infusion   Code Status: DNR with intervention Family Communication: Wife was at the bedside. Disposition Plan: Status is: Inpatient Remains inpatient appropriate because:  Significant symptoms, heparin  infusion   Consultants:  Oncology Palliative care  Procedures:  None  Antimicrobials:  Rocephin   8/23--- 8/28      Objective: Vitals:   01/03/24 1348 01/03/24 1400 01/03/24 2011 01/04/24 0322  BP: (!) 94/58 (!) 94/58 101/64 100/61  Pulse: 95 94 71 84  Resp: 20  19 19   Temp: 98.4 F (36.9 C)  98.7 F (37.1 C) 98.3 F (36.8 C)  TempSrc: Oral  Oral Oral  SpO2: 100% 99% 96% 97%  Weight:      Height:        Intake/Output Summary (Last 24 hours) at 01/04/2024 1032 Last data filed at 01/04/2024 0700 Gross per 24 hour  Intake 1901.9 ml  Output 2200 ml  Net -298.1 ml   Filed Weights   12/29/23 1513 01/01/24 0548 01/03/24 0426  Weight: 90.7 kg 91.4 kg 97 kg    Examination:  General exam: Looks fairly comfortable.  Chronically sick. Ecchymotic hands. Respiratory system: Clear to auscultation. Respiratory effort normal.  On room air.  No added sounds.  Cardiovascular system: S1 & S2 heard, RRR.  Gastrointestinal system: Soft.  Nontender.  Bowel sound present. Central nervous system: Alert and oriented.  No focal neurological deficits.  Gross generalized weakness. Multiple ecchymotic lesions on both upper extremities. Port-A-Cath right chest wall.    Data Reviewed: I have personally reviewed following labs and imaging studies  CBC: Recent Labs  Lab 12/31/23 1034 01/01/24 0523 01/02/24 0533 01/03/24 0502 01/03/24 1555 01/04/24 0540  WBC 3.2* 2.8* 3.0* 4.1  --  4.8  NEUTROABS 2.1 1.7 1.3* 1.9  --  2.1  HGB 9.5* 8.6* 8.7* 9.0* 9.5* 9.3*  HCT 31.0* 27.3* 28.0* 30.0* 30.2* 30.5*  MCV 94.5 92.2 92.7 92.3  --  91.9  PLT 116* 131* 143* 176  --  187   Basic Metabolic Panel: Recent Labs  Lab 12/31/23 1034 01/01/24 0523 01/02/24 0533 01/03/24 0502 01/04/24 0540  NA 135 138 137 138 137  K 3.6 3.8 3.9 4.0 4.6  CL 105 109 108 108 107  CO2 22 23 19* 20* 21*  GLUCOSE 98 116* 106* 112* 116*  BUN 11 12 13 15 18   CREATININE 0.58* 0.59* 0.57* 0.62 0.62  CALCIUM  8.0* 7.9* 8.0* 8.2* 8.2*  MG 1.9 2.1 2.2 2.0 2.1  PHOS 1.9* 2.9 2.1* 3.3 3.1   GFR: Estimated Creatinine Clearance: 94.8 mL/min (by C-G formula based on SCr of 0.62 mg/dL). Liver Function Tests: Recent Labs  Lab 12/29/23 1544 12/30/23 0152 12/31/23 1034 01/03/24 0502  AST 20 12* 12* 35  ALT 28 20 16 23   ALKPHOS 388* 252* 210* 183*  BILITOT 0.6 0.7 0.7 0.3  PROT 5.3* 4.8* 5.0* 4.9*  ALBUMIN 2.8* 1.8* 2.0* 2.6*   No results for input(s): LIPASE, AMYLASE in the last 168 hours. No results for input(s): AMMONIA in the last 168 hours. Coagulation Profile: Recent Labs  Lab 12/29/23 1544 12/30/23 0152  INR 1.1 1.3*   Cardiac Enzymes: No results for input(s): CKTOTAL, CKMB, CKMBINDEX, TROPONINI in the last 168 hours. BNP (last 3 results) Recent Labs    12/29/23 1544  PROBNP 550.0*   HbA1C: No results for input(s): HGBA1C in the last 72 hours. CBG: Recent Labs  Lab 12/30/23 1811 12/30/23 2028 12/30/23 2336 12/31/23 0616 12/31/23 1130  GLUCAP 84 106* 90 95 97   Lipid Profile: No results  for input(s): CHOL, HDL, LDLCALC, TRIG, CHOLHDL, LDLDIRECT in the last 72 hours.  Thyroid  Function Tests: No results for input(s): TSH, T4TOTAL, FREET4, T3FREE, THYROIDAB in the last 72 hours.  Anemia Panel: No results for input(s): VITAMINB12, FOLATE, FERRITIN, TIBC, IRON, RETICCTPCT in the last 72 hours.  Sepsis Labs: Recent Labs  Lab 12/29/23 0152  PROCALCITON 0.29    Recent Results (from the past 240 hours)  Resp panel by RT-PCR (RSV, Flu A&B, Covid) Anterior Nasal Swab     Status: None   Collection Time: 12/29/23  3:16 PM   Specimen: Anterior Nasal Swab  Result Value Ref Range Status   SARS Coronavirus 2 by RT PCR NEGATIVE NEGATIVE Final    Comment: (NOTE) SARS-CoV-2 target nucleic acids are NOT DETECTED.  The SARS-CoV-2 RNA is generally detectable in upper respiratory specimens during the acute phase of infection. The lowest concentration of SARS-CoV-2 viral copies this assay can detect is 138 copies/mL. A negative result does not preclude SARS-Cov-2 infection and should not be used as the sole basis for treatment or other patient management decisions. A negative result may occur with  improper specimen collection/handling, submission  of specimen other than nasopharyngeal swab, presence of viral mutation(s) within the areas targeted by this assay, and inadequate number of viral copies(<138 copies/mL). A negative result must be combined with clinical observations, patient history, and epidemiological information. The expected result is Negative.  Fact Sheet for Patients:  BloggerCourse.com  Fact Sheet for Healthcare Providers:  SeriousBroker.it  This test is no t yet approved or cleared by the [place]  FDA and  has been authorized for detection and/or diagnosis of SARS-CoV-2 by FDA under an Emergency Use Authorization (EUA). This EUA will remain  in effect (meaning this test can  be used) for the duration of the COVID-19 declaration under Section 564(b)(1) of the Act, 21 U.S.C.section 360bbb-3(b)(1), unless the authorization is terminated  or revoked sooner.       Influenza A by PCR NEGATIVE NEGATIVE Final   Influenza B by PCR NEGATIVE NEGATIVE Final    Comment: (NOTE) The Xpert Xpress SARS-CoV-2/FLU/RSV plus assay is intended as an aid in the diagnosis of influenza from Nasopharyngeal swab specimens and should not be used as a sole basis for treatment. Nasal washings and aspirates are unacceptable for Xpert Xpress SARS-CoV-2/FLU/RSV testing.  Fact Sheet for Patients: BloggerCourse.com  Fact Sheet for Healthcare Providers: SeriousBroker.it  This test is not yet approved or cleared by the [place]  FDA and has been authorized for detection and/or diagnosis of SARS-CoV-2 by FDA under an Emergency Use Authorization (EUA). This EUA will remain in effect (meaning this test can be used) for the duration of the COVID-19 declaration under Section 564(b)(1) of the Act, 21 U.S.C. section 360bbb-3(b)(1), unless the authorization is terminated or revoked.     Resp Syncytial Virus by PCR NEGATIVE NEGATIVE Final    Comment: (NOTE) Fact Sheet for Patients: BloggerCourse.com  Fact Sheet for Healthcare Providers: SeriousBroker.it  This test is not yet approved or cleared by the [place]  FDA and has been authorized for detection and/or diagnosis of SARS-CoV-2 by FDA under an Emergency Use Authorization (EUA). This EUA will remain in effect (meaning this test can be used) for the duration of the COVID-19 declaration under Section 564(b)(1) of the Act, 21 U.S.C. section 360bbb-3(b)(1), unless the authorization is terminated or revoked.  Performed at [facility], [address] [place], [place] [address]          Radiology  Studies: No results found.        Scheduled Meds:  benzonatate   200 mg Oral TID   Chlorhexidine  Gluconate Cloth  6 each Topical Daily   enoxaparin  (LOVENOX ) injection  60 mg Subcutaneous Q12H   guaiFENesin   600 mg Oral BID   levothyroxine   112 mcg Oral Q0600   mineral oil-hydrophilic petrolatum   Topical BID   pantoprazole  (PROTONIX ) IV  40 mg Intravenous Q12H   Continuous Infusions:  TPN CYCLIC-ADULT (ION)       LOS: 5 days    Time spent: 40 minutes    Renato Applebaum, MD [facility]

## 2024-01-04 NOTE — Progress Notes (Signed)
 IVT consulted to deaccess pt's PAC. Review of chart showing pt to receive TPN infusions from home. This RN to bedside, discussed with pt and family, HH scheduled to reaccess [facility] on Wednesdays and their understanding is his PAC is to remain accessed at discharge. Leon to primary RN and MD. Received order from MD to leave [facility] accessed at discharge. PAC assessed, changed caps, flushed with GBR and heparinized for home.

## 2024-01-04 NOTE — Evaluation (Signed)
 Occupational Therapy Evaluation Patient Details Name: Todd Harrison MRN: [id number] DOB: [birth year] Today's Date: 01/04/2024   History of Present Illness   78 year old gentleman admitted secondary to cough and hemoptysis, pleuritic chest pain. Patient was found to have bilateral lower lobe PE without evidence of right heart strain. started on heparin  infusion, has intermittent mild bleeding but no gross bleeding.  Followed by oncology and   with history of metastatic gastric cancer currently on chemotherapy, paroxysmal A-fib, bilateral pulmonary embolism with concurrent DVT who was on prophylactic dose of Lovenox  at home 40 mg daily, previous upper GI bleeding and hemorrhagic shock requiring ICU management, chronic diastolic heart failure, anemia of chronic disease and chronic diarrhea     Clinical Impressions PTA, patient lives with wife and was indep including A/IADL's, amb, work and driving.  Currently, patient presents with deficits outlined below (see OT Problem List for details) most significantly decreased activity tolerance and balance impacting BADL's and functional mobility. OT issued and trained in ECT principles and trialed rollator with shower transfers. Patient and wife agree on rollator needs and wife to obtain shower seat as needed upon d/c and provided with handout for types. Patient requires continued Acute care hospital level OT services to progress safety and functional performance and allow for discharge. Anticipate, with wife support and assistance, no follow up OT needs post hospital discharge.       If plan is discharge home, recommend the following:   A little help with walking and/or transfers;A little help with bathing/dressing/bathroom;Assistance with cooking/housework;Assist for transportation;Help with stairs or ramp for entrance     Functional Status Assessment   Patient has had a recent decline in their functional status and demonstrates the ability to make  significant improvements in function in a reasonable and predictable amount of time.     Equipment Recommendations   Other (comment) (rollator and wife will obtain shower chair as needed upon d/c from outside or online, handout provided)      Precautions/Restrictions   Precautions Precautions: Fall Restrictions Weight Bearing Restrictions Per Provider Order: No     Mobility Bed Mobility Overal bed mobility: Modified Independent             General bed mobility comments: HOB elevated    Transfers Overall transfer level: Needs assistance Equipment used: Rollator (4 wheels) Transfers: Sit to/from Stand, Bed to chair/wheelchair/BSC Sit to Stand: Supervision     Step pivot transfers: Supervision     General transfer comment: use of rollator to and from bathroom and for steadying support in and out of stall shower, used seat for seated rests as needed and educated in use and integration for light item transport with patient and wife      Balance Overall balance assessment: Needs assistance, History of Falls Sitting-balance support: Feet supported, No upper extremity supported Sitting balance-Leahy Scale: Good     Standing balance support: No upper extremity supported, During functional activity, Reliant on assistive device for balance Standing balance-Leahy Scale: Fair                             ADL either performed or assessed with clinical judgement   ADL Overall ADL's : Needs assistance/impaired Eating/Feeding: Independent   Grooming: Wash/dry face;Wash/dry hands;Oral care;Applying deodorant;Modified independent;Sitting   Upper Body Bathing: Modified independent;Sitting   Lower Body Bathing: Set up;Sit to/from stand   Upper Body Dressing : Modified independent;Sitting   Lower Body Dressing: Set up;Sit to/from  stand   Toilet Transfer: Retail banker;Ambulation   Toileting- Clothing Manipulation and Hygiene: Set up;Sit  to/from stand   Tub/ Shower Transfer: Supervision/safety;Ambulation;Grab bars;Rollator (4 wheels) Tub/Shower Transfer Details (indicate cue type and reason): stepped in and out of stall shower with threshold in room Functional mobility during ADLs: Supervision/safety;Rollator (4 wheels) General ADL Comments: ECT and 5 P's education, rollator training with patient and wife     Vision Baseline Vision/History: 0 No visual deficits              Pertinent Vitals/Pain Pain Assessment Pain Assessment: No/denies pain     Extremity/Trunk Assessment Upper Extremity Assessment Upper Extremity Assessment: Right hand dominant;Overall WFL for tasks assessed   Lower Extremity Assessment Lower Extremity Assessment: Overall WFL for tasks assessed   Cervical / Trunk Assessment Cervical / Trunk Assessment: Other exceptions Cervical / Trunk Exceptions: fwd head posture, protracted scap   Communication Communication Communication: No apparent difficulties   Cognition Arousal: Alert Behavior During Therapy: WFL for tasks assessed/performed Cognition: No apparent impairments                               Following commands: Intact       Cueing  General Comments   Cueing Techniques: Verbal cues  noted effects of prolonged bed rest, educated in ECT principles and provided handout for 5 P's carryover           Home Living Family/patient expects to be discharged to:: Private residence Living Arrangements: Spouse/significant other Available Help at Discharge: Family;Available 24 hours/day Type of Home: House Home Access: Stairs to enter [employer] of Steps: 2 Entrance Stairs-Rails: None Home Layout: One level     Bathroom Shower/Tub: Tax inspector: Standard     Home Equipment: None   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      Prior Functioning/Environment Prior Level of Function : Independent/Modified Independent                     OT Problem List: Decreased activity tolerance;Impaired balance (sitting and/or standing);Cardiopulmonary status limiting activity   OT Treatment/Interventions: Self-care/ADL training;Therapeutic exercise;Neuromuscular education;Energy conservation;DME and/or AE instruction;Therapeutic activities;Patient/family education;Balance training      OT Goals(Current goals can be found in the care plan section)   Acute Rehab OT Goals Patient Stated Goal: to go home soon OT Goal Formulation: With patient/family Time For Goal Achievement: 01/18/24 Potential to Achieve Goals: Good   OT Frequency:  Min 1X/week       AM-PAC OT 6 Clicks Daily Activity     Outcome Measure Help from another person eating meals?: None Help from another person taking care of personal grooming?: None Help from another person toileting, which includes using toliet, bedpan, or urinal?: None Help from another person bathing (including washing, rinsing, drying)?: A Little Help from another person to put on and taking off regular upper body clothing?: None Help from another person to put on and taking off regular lower body clothing?: A Little 6 Click Score: 22   End of Session Equipment Utilized During Treatment: Gait belt;Rollator (4 wheels) Nurse Communication: Mobility status  Activity Tolerance: Patient tolerated treatment well Patient left: in bed;with call bell/phone within reach;with bed alarm set;with family/visitor present  OT Visit Diagnosis: Unsteadiness on feet (R26.81)                Time: 0915-1000 OT Time Calculation (min): 45 min Charges:  OT General Charges $OT Visit: 1 Visit OT Evaluation $OT Eval Low Complexity: 1 Low OT Treatments $Therapeutic Activity: 8-22 mins Nicolo Tomko OT/L Acute Rehabilitation Department  [phone]  01/04/2024, 12:01 PM

## 2024-01-07 ENCOUNTER — Other Ambulatory Visit: Payer: Self-pay

## 2024-01-09 ENCOUNTER — Inpatient Hospital Stay

## 2024-01-09 ENCOUNTER — Inpatient Hospital Stay: Admitting: Nurse Practitioner

## 2024-01-09 ENCOUNTER — Encounter: Payer: Self-pay | Admitting: *Deleted

## 2024-01-09 ENCOUNTER — Inpatient Hospital Stay: Attending: Nurse Practitioner

## 2024-01-09 ENCOUNTER — Ambulatory Visit

## 2024-01-09 DIAGNOSIS — D6481 Anemia due to antineoplastic chemotherapy: Secondary | ICD-10-CM | POA: Insufficient documentation

## 2024-01-09 DIAGNOSIS — I2699 Other pulmonary embolism without acute cor pulmonale: Secondary | ICD-10-CM | POA: Insufficient documentation

## 2024-01-09 DIAGNOSIS — C169 Malignant neoplasm of stomach, unspecified: Secondary | ICD-10-CM | POA: Insufficient documentation

## 2024-01-09 DIAGNOSIS — R21 Rash and other nonspecific skin eruption: Secondary | ICD-10-CM | POA: Insufficient documentation

## 2024-01-09 DIAGNOSIS — Z7901 Long term (current) use of anticoagulants: Secondary | ICD-10-CM | POA: Diagnosis not present

## 2024-01-09 DIAGNOSIS — Z5111 Encounter for antineoplastic chemotherapy: Secondary | ICD-10-CM | POA: Insufficient documentation

## 2024-01-09 DIAGNOSIS — C786 Secondary malignant neoplasm of retroperitoneum and peritoneum: Secondary | ICD-10-CM | POA: Insufficient documentation

## 2024-01-09 DIAGNOSIS — I82411 Acute embolism and thrombosis of right femoral vein: Secondary | ICD-10-CM | POA: Diagnosis not present

## 2024-01-09 DIAGNOSIS — Z86718 Personal history of other venous thrombosis and embolism: Secondary | ICD-10-CM | POA: Insufficient documentation

## 2024-01-09 DIAGNOSIS — I82431 Acute embolism and thrombosis of right popliteal vein: Secondary | ICD-10-CM | POA: Diagnosis not present

## 2024-01-09 DIAGNOSIS — I959 Hypotension, unspecified: Secondary | ICD-10-CM | POA: Insufficient documentation

## 2024-01-09 DIAGNOSIS — R042 Hemoptysis: Secondary | ICD-10-CM | POA: Diagnosis not present

## 2024-01-09 DIAGNOSIS — R180 Malignant ascites: Secondary | ICD-10-CM | POA: Insufficient documentation

## 2024-01-09 DIAGNOSIS — Z789 Other specified health status: Secondary | ICD-10-CM

## 2024-01-09 LAB — CMP (CANCER CENTER ONLY)
ALT: 18 U/L (ref 0–44)
AST: 24 U/L (ref 15–41)
Albumin: 3.3 g/dL — ABNORMAL LOW (ref 3.5–5.0)
Alkaline Phosphatase: 156 U/L — ABNORMAL HIGH (ref 38–126)
Anion gap: 11 (ref 5–15)
BUN: 22 mg/dL (ref 8–23)
CO2: 24 mmol/L (ref 22–32)
Calcium: 9.4 mg/dL (ref 8.9–10.3)
Chloride: 101 mmol/L (ref 98–111)
Creatinine: 0.74 mg/dL (ref 0.61–1.24)
GFR, Estimated: >60 mL/min (ref >60)
Glucose, Bld: 104 mg/dL — ABNORMAL HIGH (ref 70–99)
Potassium: 4.3 mmol/L (ref 3.5–5.1)
Sodium: 136 mmol/L (ref 135–145)
Total Bilirubin: 0.6 mg/dL (ref 0.0–1.2)
Total Protein: 6.2 g/dL — ABNORMAL LOW (ref 6.5–8.1)

## 2024-01-09 LAB — CBC WITH DIFFERENTIAL (CANCER CENTER ONLY)
Abs Immature Granulocytes: 0.02 K/uL (ref 0.00–0.07)
Basophils Absolute: 0.1 K/uL (ref 0.0–0.1)
Basophils Relative: 1 %
Eosinophils Absolute: 0.3 K/uL (ref 0.0–0.5)
Eosinophils Relative: 5 %
HCT: 34.3 % — ABNORMAL LOW (ref 39.0–52.0)
Hemoglobin: 11.1 g/dL — ABNORMAL LOW (ref 13.0–17.0)
Immature Granulocytes: 0 %
Lymphocytes Relative: 25 %
Lymphs Abs: 1.6 K/uL (ref 0.7–4.0)
MCH: 29.1 pg (ref 26.0–34.0)
MCHC: 32.4 g/dL (ref 30.0–36.0)
MCV: 89.8 fL (ref 80.0–100.0)
Monocytes Absolute: 0.7 K/uL (ref 0.1–1.0)
Monocytes Relative: 11 %
Neutro Abs: 3.6 K/uL (ref 1.7–7.7)
Neutrophils Relative %: 58 %
Platelet Count: 168 K/uL (ref 150–400)
RBC: 3.82 MIL/uL — ABNORMAL LOW (ref 4.22–5.81)
RDW: 19.9 % — ABNORMAL HIGH (ref 11.5–15.5)
WBC Count: 6.3 K/uL (ref 4.0–10.5)
nRBC: 0 % (ref 0.0–0.2)

## 2024-01-09 LAB — PHOSPHORUS: Phosphorus: 3 mg/dL (ref 2.5–4.6)

## 2024-01-09 LAB — MAGNESIUM: Magnesium: 2.1 mg/dL (ref 1.7–2.4)

## 2024-01-09 NOTE — Progress Notes (Signed)
 Faxed 01/09/24 lab results to [facility] [phone]

## 2024-01-09 NOTE — Patient Instructions (Signed)

## 2024-01-10 ENCOUNTER — Ambulatory Visit: Admitting: Nutrition

## 2024-01-10 ENCOUNTER — Inpatient Hospital Stay (HOSPITAL_BASED_OUTPATIENT_CLINIC_OR_DEPARTMENT_OTHER): Admitting: Nurse Practitioner

## 2024-01-10 ENCOUNTER — Inpatient Hospital Stay

## 2024-01-10 VITALS — BP 98/60 | HR 80

## 2024-01-10 VITALS — BP 110/68 | HR 87 | Temp 98.1°F | Resp 18

## 2024-01-10 DIAGNOSIS — C169 Malignant neoplasm of stomach, unspecified: Secondary | ICD-10-CM

## 2024-01-10 DIAGNOSIS — C168 Malignant neoplasm of overlapping sites of stomach: Secondary | ICD-10-CM

## 2024-01-10 DIAGNOSIS — Z5111 Encounter for antineoplastic chemotherapy: Secondary | ICD-10-CM | POA: Diagnosis not present

## 2024-01-10 MED ORDER — APREPITANT 130 MG/18ML IV EMUL
130.0000 mg | Freq: Once | INTRAVENOUS | Status: AC
  Administered 2024-01-10: 130 mg via INTRAVENOUS
  Filled 2024-01-10: qty 18

## 2024-01-10 MED ORDER — SODIUM CHLORIDE 0.9 % IV SOLN: Freq: Once | INTRAVENOUS | Status: AC

## 2024-01-10 MED ORDER — OLANZAPINE 5 MG PO TABS
5.0000 mg | ORAL_TABLET | Freq: Once | ORAL | Status: AC
  Administered 2024-01-10: 5 mg via ORAL
  Filled 2024-01-10: qty 1

## 2024-01-10 MED ORDER — OXALIPLATIN CHEMO INJECTION 100 MG/20ML
65.0000 mg/m2 | Freq: Once | INTRAVENOUS | Status: AC
  Administered 2024-01-10: 150 mg via INTRAVENOUS
  Filled 2024-01-10: qty 20

## 2024-01-10 MED ORDER — LORAZEPAM 1 MG PO TABS: 0.5000 mg | ORAL_TABLET | ORAL | Status: DC | PRN

## 2024-01-10 MED ORDER — PALONOSETRON HCL INJECTION 0.25 MG/5ML
0.2500 mg | Freq: Once | INTRAVENOUS | Status: AC
  Administered 2024-01-10: 0.25 mg via INTRAVENOUS
  Filled 2024-01-10: qty 5

## 2024-01-10 MED ORDER — FAMOTIDINE IN NACL 20-0.9 MG/50ML-% IV SOLN
20.0000 mg | Freq: Once | INTRAVENOUS | Status: AC
  Administered 2024-01-10: 20 mg via INTRAVENOUS
  Filled 2024-01-10: qty 50

## 2024-01-10 MED ORDER — DEXTROSE 5 % IV SOLN
INTRAVENOUS | Status: DC
Start: 2024-01-10 — End: 2024-01-10

## 2024-01-10 MED ORDER — FLUOROURACIL CHEMO INJECTION 2.5 GM/50ML
400.0000 mg/m2 | Freq: Once | INTRAVENOUS | Status: AC
  Administered 2024-01-10: 850 mg via INTRAVENOUS
  Filled 2024-01-10: qty 17

## 2024-01-10 MED ORDER — PROCHLORPERAZINE MALEATE 10 MG PO TABS
5.0000 mg | ORAL_TABLET | Freq: Once | ORAL | Status: AC
  Administered 2024-01-10: 5 mg via ORAL

## 2024-01-10 MED ORDER — SODIUM CHLORIDE 0.9 % IV SOLN
4500.0000 mg | INTRAVENOUS | Status: DC
  Administered 2024-01-10: 4500 mg via INTRAVENOUS
  Filled 2024-01-10: qty 90

## 2024-01-10 MED ORDER — SODIUM CHLORIDE 0.9 % IV SOLN: INTRAVENOUS | Status: DC

## 2024-01-10 MED ORDER — ZOLBETUXIMAB-CLZB CHEMO 100MG/5ML IV SOLN
400.0000 mg/m2 | Freq: Once | INTRAVENOUS | Status: AC
  Administered 2024-01-10: 870 mg via INTRAVENOUS
  Filled 2024-01-10: qty 43.5

## 2024-01-10 MED ORDER — PROCHLORPERAZINE MALEATE 10 MG PO TABS
5.0000 mg | ORAL_TABLET | Freq: Once | ORAL | Status: AC
  Administered 2024-01-10: 5 mg via ORAL
  Filled 2024-01-10: qty 1

## 2024-01-10 MED ORDER — DIPHENHYDRAMINE HCL 50 MG/ML IJ SOLN
25.0000 mg | Freq: Once | INTRAMUSCULAR | Status: AC
  Administered 2024-01-10: 25 mg via INTRAVENOUS
  Filled 2024-01-10: qty 1

## 2024-01-10 MED ORDER — LEUCOVORIN CALCIUM INJECTION 350 MG
400.0000 mg/m2 | Freq: Once | INTRAVENOUS | Status: AC
  Administered 2024-01-10: 868 mg via INTRAVENOUS
  Filled 2024-01-10: qty 43.4

## 2024-01-10 MED ORDER — DEXAMETHASONE SODIUM PHOSPHATE 10 MG/ML IJ SOLN
10.0000 mg | Freq: Once | INTRAMUSCULAR | Status: AC
  Administered 2024-01-10: 10 mg via INTRAVENOUS
  Filled 2024-01-10: qty 1

## 2024-01-10 NOTE — Progress Notes (Signed)
 Pt arrived to scheduled infusion appt. BP was 82/62, manual 88/60 with HR of 103. MD notified. MD ordering normal saline fluids at 200ml/hr. MD coming to see patient before starting chemo treatment.

## 2024-01-10 NOTE — Progress Notes (Signed)
 Consulted with provider regarding additional fluid needs for patient on TPN.

## 2024-01-10 NOTE — Patient Instructions (Signed)
 [facility] - A DEPT OF [place]  Discharge Instructions: Thank you for choosing [facility] to provide your oncology and hematology care.   If you have a lab appointment with the Cancer Center, please go directly to the Cancer Center and check in at the registration area.   Wear comfortable clothing and clothing appropriate for easy access to any Portacath or PICC line.   We strive to give you quality time with your provider. You may need to reschedule your appointment if you arrive late (15 or more minutes).  Arriving late affects you and other patients whose appointments are after yours.  Also, if you miss three or more appointments without notifying the office, you may be dismissed from the clinic at the provider's discretion.      For prescription refill requests, have your pharmacy contact our office and allow 72 hours for refills to be completed.    Today you received the following chemotherapy and/or immunotherapy agents: vyloy , oxaliplatin , leucovorin , fluorouracil     To help prevent nausea and vomiting after your treatment, we encourage you to take your nausea medication as directed.  BELOW ARE SYMPTOMS THAT SHOULD BE REPORTED IMMEDIATELY: *FEVER GREATER THAN 100.4 F (38 C) OR HIGHER *CHILLS OR SWEATING *NAUSEA AND VOMITING THAT IS NOT CONTROLLED WITH YOUR NAUSEA MEDICATION *UNUSUAL SHORTNESS OF BREATH *UNUSUAL BRUISING OR BLEEDING *URINARY PROBLEMS (pain or burning when urinating, or frequent urination) *BOWEL PROBLEMS (unusual diarrhea, constipation, pain near the anus) TENDERNESS IN MOUTH AND THROAT WITH OR WITHOUT PRESENCE OF ULCERS (sore throat, sores in mouth, or a toothache) UNUSUAL RASH, SWELLING OR PAIN  UNUSUAL VAGINAL DISCHARGE OR ITCHING   Items with * indicate a potential emergency and should be followed up as soon as possible or go to the Emergency Department if any problems should occur.  Please show the  CHEMOTHERAPY ALERT CARD or IMMUNOTHERAPY ALERT CARD at check-in to the Emergency Department and triage nurse.  Should you have questions after your visit or need to cancel or reschedule your appointment, please contact [facility] - A DEPT OF [facility]  Dept: [phone]  and follow the prompts.  Office hours are 8:00 a.m. to 4:30 p.m. Monday - Friday. Please note that voicemails left after 4:00 p.m. may not be returned until the following business day.  We are closed weekends and major holidays. You have access to a nurse at all times for urgent questions. Please call the main number to the clinic Dept: [phone] and follow the prompts.   For any non-urgent questions, you may also contact your provider using MyChart. We now offer e-Visits for anyone 74 and older to request care online for non-urgent symptoms. For details visit [email or web address].   Also download the MyChart app! Go to the app store, search MyChart, open the app, select [facility], and log in with your MyChart username and password.

## 2024-01-10 NOTE — Progress Notes (Signed)
 [facility] OFFICE PROGRESS NOTE   Diagnosis: Gastric cancer  INTERVAL HISTORY:   [name] has completed 4 cycles of FOLFOX plus zolbetuximab.  He presents today for cycle 5.  Infusion nurse request evaluation due to hypotension.  [name] was hospitalized 12/29/2023 through 01/04/2024 presenting with hemoptysis.  He was found to have bilateral lower lobe pulmonary emboli without evidence of right heart strain.  He was started on a heparin  infusion, converted to Lovenox .  He continued to have chronic small-volume hemoptysis.  Hemoglobin remained stable.  Restaging CT abdomen/pelvis on 01/01/2024 showed no evidence of disease progression and reduced ascites.  He continues to expectorate a small amount of dark blood multiple times a day.  The blood is mixed with mucus.  No other bleeding.  He continues TPN.  Periodic nausea/vomiting.  Loose stools which he attributes to the TPN.  Denies neuropathy symptoms.  Objective:  Vital signs in last 24 hours:  Temperature 97.8, heart rate 80, respirations 18, manual blood pressure by myself 98/60    HEENT: No thrush or ulcers. Resp: Lungs clear bilaterally. Cardio: Regular with occasional premature beat. GI: Abdomen is soft, nontender.  No hepatomegaly. Vascular: Trace lower leg edema bilaterally. Neuro: Alert and oriented. Port-A-Cath without erythema.  Lab Results:  Lab Results  Component Value Date   WBC 6.3 01/09/2024   HGB 11.1 (L) 01/09/2024   HCT 34.3 (L) 01/09/2024   MCV 89.8 01/09/2024   PLT 168 01/09/2024   NEUTROABS 3.6 01/09/2024    Imaging:  No results found.  Medications: I have reviewed the patient's current medications.  Assessment/Plan: Gastric cancer with CT evidence of abdominal carcinomatosis CT abdomen/pelvis 04/13/2022-ascites, diffuse omental and peritoneal nodularity, possible circumferential mass in the transverse colon, new small left greater than right pleural effusions CT abdominal  mass biopsy 04/24/2022-metastatic poorly differentiated adenocarcinoma with very focal signet ring cell features CT paracentesis 04/24/2022-no malignant cells identified Colonoscopy 04/25/2022-diverticulosis in the sigmoid colon.  Internal hemorrhoids.  No specimens collected. Upper endoscopy 04/25/2022-large infiltrative mass with oozing bleeding and stigmata of recent bleeding in the gastric fundus, on the anterior wall of the gastric body and on the greater curvature of the gastric body; deformity in the gastric antrum.  Extrinsic deformity in the entire duodenum.  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 Her2 by IHC negative, 1+; MMR IHC normal; PD-L1 CPS 1%, positive. Claudin 18 positive, 100% Cycle 1 FOLFOX 05/04/2022 Cycle 2 FOLFOX 05/17/2022 Cycle 3 FOLFOX 05/31/2022-oxaliplatin  dose reduced secondary to prolonged cold sensitivity Cycle 4 FOLFOX 06/14/2022 Cycle 5 FOLFOX 06/28/2022 CT abdomen/pelvis 07/08/2022-asymmetric wall thickening at the lesser curvature of the stomach, decreased ascites, increased diffuse omental and peritoneal nodularity Cycle 6 FOLFOX 07/12/2022 Cycle 7 FOLFOX 08/09/2022 Cycle 8 FOLFOX 08/23/2022 Cycle 9 FOLFOX 09/06/2022 CTs 09/11/2022-stable, no new or progressive interval findings Cycle 10 FOLFOX 09/20/2022 Xeloda  maintenance 2 weeks on/1 week off 10/16/2022 Cycle 2 Xeloda  11/06/2022-Xeloda  dose reduced secondary to early foot skin toxicity Cycle 3 Xeloda  11/27/2022 Cycle 4 Xeloda  12/18/2022 CTs 01/02/2023-wall thickening of the mid gastric body-possibly progressive, unchanged peritoneal/omental caking, unchanged L1 compression fracture Cycle 5 Xeloda  beginning 01/08/2023 Cycle 6 Xeloda  beginning 01/29/2023 Cycle 7 Xeloda  beginning 02/19/2023 Cycle 8 Xeloda  beginning 03/12/2023 Cycle 9 Xeloda  beginning 04/09/2023 Cycle 10 Xeloda  beginning 05/07/2023 CTs 05/21/2023: Unchanged wall thickening of the gastric body, slight increase in  small volume ascites, diffuse peritoneal thickening and stranding of the omentum, no evidence of metastatic disease to the chest Cycle 11 Xeloda   beginning 05/28/2023 Cycle 12 Xeloda  beginning 06/18/2023 Cycle 13 Xeloda  beginning 07/09/2023 Cycle 14 Xeloda  beginning 07/30/2023 CTA abdomen/pelvis 08/14/2023: Large volume of heterogenous material in the gastric lumen-food/blood, small volume ascites with diffuse peritoneal thickening and matting of the bowel/omentum, enhancement of the gastric body 08/18/2023: EGD-large mass in the gastric fundus/body with no bleeding, clean-based ulcers overlying 1 area of the mass, normal duodenum Cycle 15 Xeloda  09/03/2023 Cycle 16 Xeloda  09/24/2023 CTs 10/31/2023-moderate gastric body wall thickening similar.  Ill-definition of soft tissue planes in the lesser sac and porta hepatis new or progressive.  Development of intrahepatic duct dilatation.  Increase in small volume abdominal ascites with persistent peritoneal metastasis.  New trace left pleural fluid.  New T8 heterogeneous sclerosis and vertebral body height loss. Cycle 1 FOLFOX plus zolbetuximab beginning 11/14/2023 (zolbetuximab 11/14/2023, FOLFOX 11/15/2023) Cycle 2 FOLFOX plus zolbetuximab (zolbetuximab 11/28/23, FOLFOX anticipated 11/29/23) Cycle 3 FOLFOX plus zolbetuximab 12/12/2023 Cycle 4 FOLFOX plus zolbetuximab 12/27/2023 CT abdomen/pelvis 01/01/2024: Improved lower lobe pulmonary embolism, stable gastric wall thickening, infiltrative density in the lesser sac region, tumor caking along the omentum and mesentery-stable, reduced anterior ascites stable 1.1 cm segment 4 liver lesion Truncal rash-status post recent punch biopsy with pathology pending; biopsy reported negative per family 04/26/2022 Lichen planus of the feet Hypothyroidism Atrial fibrillation Admission 07/24/2022 with acute bilateral pulmonary embolism/right heart strain Heparin  anticoagulation 07/24/2022 Doppler 07/24/2022-acute DVT of the right femoral,  popliteal, posterior tibial, and peroneal veins, acute left posterior tibial DVT Lovenox  anticoagulation, changed to once daily dosing 05/25/2023 Lovenox  changed to 40 mg daily 08/30/2023 7.  Anemia secondary to chemotherapy, phlebotomy, and hemoptysis-improved 8.  L1 compression fracture on plain x-ray 12/18/2022 MRI lumbar spine 02/02/2023-acute/subacute L1 compression fracture, mild degenerative change of the lumbar spine 9.  Admission 08/14/2023 with hematemesis 10. C. Difficile positive by PCR-completed course of Vancomycin  11.  Admission 12/29/2023 with bilateral pulmonary embolism-heparin  Dopplers 01/01/2024: Acute right common femoral and popliteal DVT 01/03/2024 heparin  discontinued, Lovenox  60 mg twice daily 12.  Diffuse pruritic rash-etiology?  Improved    Disposition: Mr. Polanco appears stable.  He has completed 4 cycles of FOLFOX plus zolbetuximab.  Recent restaging CTs showed no evidence of disease progression and reduced ascites.  Mr. Shad confirms his desire to continue treatment with FOLFOX plus zolbetuximab.  Plan to proceed with cycle 5 today as scheduled.  CBC and chemistry panel completed 01/09/2024, adequate for treatment.  Hemoglobin is higher.  He has chronic mild hypotension.  I performed a manual blood pressure.  Reading was within his normal range.  We will continue to monitor.  He continues to have intermittent mild hemoptysis.  He will contact the office with any increase.  He continues Lovenox  for recent pulmonary emboli.  He will return for follow-up and treatment in 2 weeks.  He will contact the office in the interim with any problems.  Patient seen with Dr. Cloretta.  Olam Ned ANP/GNP-BC   01/10/2024  11:00 AM This was a shared visit with Olam Ned.  Mr. Borquez was interviewed and examined.  He has decided to continue systemic therapy.  He will complete another cycle of FOLFOX/zolbetuximab today.  He reports small volume hemoptysis (dark blood) approximately  4 times daily.  No new complaint.  He is tolerating the Lovenox  injections well.  He continues home TPN.  Arvella Cloretta, MD

## 2024-01-10 NOTE — Progress Notes (Signed)
 Verbal order received from Dr. Cloretta: Genna to disconnect pump on Saturday before infusion completion and waste the rest of the 5FU medication.

## 2024-01-11 ENCOUNTER — Telehealth: Payer: Self-pay | Admitting: *Deleted

## 2024-01-11 ENCOUNTER — Other Ambulatory Visit: Payer: Self-pay

## 2024-01-11 ENCOUNTER — Encounter

## 2024-01-11 NOTE — Telephone Encounter (Signed)
 Call from [facility], physical therapy with [facility]. Asking for OK for PT weekly x 4 for balance, endurance and fall prevention. Orders were approved

## 2024-01-12 ENCOUNTER — Encounter

## 2024-01-12 ENCOUNTER — Inpatient Hospital Stay

## 2024-01-12 ENCOUNTER — Other Ambulatory Visit: Payer: Self-pay | Admitting: Oncology

## 2024-01-14 ENCOUNTER — Telehealth: Payer: Self-pay | Admitting: *Deleted

## 2024-01-14 NOTE — Telephone Encounter (Signed)
 Mr. Knebel had called [facility] on 9/6 pm to report a pruritic rash. They were not able to reach him on return call. This RN called to f/u. Reports the rash is red and mostly located on his back, shoulders,legs and abdomen. He is unsure if it is raised or flat. States it happened after previous treatment as well. Is improving today and hydroxyzine  is controlling the itching.

## 2024-01-15 ENCOUNTER — Encounter: Payer: Self-pay | Admitting: Oncology

## 2024-01-17 LAB — LAB REPORT - SCANNED: EGFR: 102

## 2024-01-23 ENCOUNTER — Inpatient Hospital Stay

## 2024-01-23 ENCOUNTER — Inpatient Hospital Stay (HOSPITAL_BASED_OUTPATIENT_CLINIC_OR_DEPARTMENT_OTHER): Admitting: Oncology

## 2024-01-23 ENCOUNTER — Other Ambulatory Visit (HOSPITAL_BASED_OUTPATIENT_CLINIC_OR_DEPARTMENT_OTHER): Payer: Self-pay

## 2024-01-23 VITALS — BP 94/62 | HR 92 | Temp 97.8°F | Resp 15 | Wt 201.4 lb

## 2024-01-23 DIAGNOSIS — I2699 Other pulmonary embolism without acute cor pulmonale: Secondary | ICD-10-CM

## 2024-01-23 DIAGNOSIS — C169 Malignant neoplasm of stomach, unspecified: Secondary | ICD-10-CM

## 2024-01-23 DIAGNOSIS — Z5111 Encounter for antineoplastic chemotherapy: Secondary | ICD-10-CM | POA: Diagnosis not present

## 2024-01-23 LAB — CBC WITH DIFFERENTIAL (CANCER CENTER ONLY)
Abs Immature Granulocytes: 0.01 K/uL (ref 0.00–0.07)
Basophils Absolute: 0 K/uL (ref 0.0–0.1)
Basophils Relative: 1 %
Eosinophils Absolute: 0.1 K/uL (ref 0.0–0.5)
Eosinophils Relative: 2 %
HCT: 31.4 % — ABNORMAL LOW (ref 39.0–52.0)
Hemoglobin: 10.2 g/dL — ABNORMAL LOW (ref 13.0–17.0)
Immature Granulocytes: 0 %
Lymphocytes Relative: 26 %
Lymphs Abs: 1.3 K/uL (ref 0.7–4.0)
MCH: 29.1 pg (ref 26.0–34.0)
MCHC: 32.5 g/dL (ref 30.0–36.0)
MCV: 89.7 fL (ref 80.0–100.0)
Monocytes Absolute: 0.7 K/uL (ref 0.1–1.0)
Monocytes Relative: 13 %
Neutro Abs: 3.1 K/uL (ref 1.7–7.7)
Neutrophils Relative %: 58 %
Platelet Count: 116 K/uL — ABNORMAL LOW (ref 150–400)
RBC: 3.5 MIL/uL — ABNORMAL LOW (ref 4.22–5.81)
RDW: 19.9 % — ABNORMAL HIGH (ref 11.5–15.5)
WBC Count: 5.2 K/uL (ref 4.0–10.5)
nRBC: 0 % (ref 0.0–0.2)

## 2024-01-23 LAB — MAGNESIUM: Magnesium: 2 mg/dL (ref 1.7–2.4)

## 2024-01-23 LAB — PHOSPHORUS: Phosphorus: 3.5 mg/dL (ref 2.5–4.6)

## 2024-01-23 LAB — CMP (CANCER CENTER ONLY)
ALT: 92 U/L — ABNORMAL HIGH (ref 0–44)
AST: 106 U/L — ABNORMAL HIGH (ref 15–41)
Albumin: 3.1 g/dL — ABNORMAL LOW (ref 3.5–5.0)
Alkaline Phosphatase: 170 U/L — ABNORMAL HIGH (ref 38–126)
Anion gap: 10 (ref 5–15)
BUN: 24 mg/dL — ABNORMAL HIGH (ref 8–23)
CO2: 23 mmol/L (ref 22–32)
Calcium: 8.7 mg/dL — ABNORMAL LOW (ref 8.9–10.3)
Chloride: 104 mmol/L (ref 98–111)
Creatinine: 0.7 mg/dL (ref 0.61–1.24)
GFR, Estimated: >60 mL/min (ref >60)
Glucose, Bld: 116 mg/dL — ABNORMAL HIGH (ref 70–99)
Potassium: 4.5 mmol/L (ref 3.5–5.1)
Sodium: 137 mmol/L (ref 135–145)
Total Bilirubin: 0.7 mg/dL (ref 0.0–1.2)
Total Protein: 5.7 g/dL — ABNORMAL LOW (ref 6.5–8.1)

## 2024-01-23 MED ORDER — ENOXAPARIN SODIUM 60 MG/0.6ML IJ SOSY
60.0000 mg | PREFILLED_SYRINGE | Freq: Two times a day (BID) | INTRAMUSCULAR | 2 refills | Status: DC
  Filled 2024-01-23 – 2024-01-28 (×3): qty 36, 30d supply, fill #0

## 2024-01-23 NOTE — Addendum Note (Signed)
 Addended by: STEVA DEVERE CROME on: 01/23/2024 09:51 AM   Modules accepted: Orders

## 2024-01-23 NOTE — Patient Instructions (Signed)

## 2024-01-23 NOTE — Progress Notes (Signed)
 [facility] OFFICE PROGRESS NOTE   Diagnosis: Gastric cancer  INTERVAL HISTORY:   Todd Harrison completed on cycle of FOLFOX/zolbetuximab on 01/10/2024.  No nausea/vomiting.  No peripheral numbness.  He fell at home earlier this week and developed skin tears at both arms.  He has a sore at the sacrum.  He continues home TPN. Todd Harrison is here today with his wife and daughter.  He continues Lovenox . Objective:  Vital signs in last 24 hours:  Blood pressure (!) 87/56, pulse 92, temperature 97.8 F (36.6 C), temperature source Temporal, resp. rate 15, weight 201 lb 6.4 oz (91.4 kg), SpO2 100%.    HEENT: No thrush or ulcers Resp: Lungs clear bilaterally Cardio: Regular rhythm with premature beats, 2/6 systolic murmur GI: No hepatosplenomegaly, no mass Vascular: Trace edema at the left lower leg  Skin: Superficial ulcers at the sacrum, multiple ecchymoses and skin tears at the arms  Portacath/PICC-without erythema  Lab Results:  Lab Results  Component Value Date   WBC 5.2 01/23/2024   HGB 10.2 (L) 01/23/2024   HCT 31.4 (L) 01/23/2024   MCV 89.7 01/23/2024   PLT 116 (L) 01/23/2024   NEUTROABS 3.1 01/23/2024    CMP  Lab Results  Component Value Date   NA 137 01/23/2024   K 4.5 01/23/2024   CL 104 01/23/2024   CO2 23 01/23/2024   GLUCOSE 116 (H) 01/23/2024   BUN 24 (H) 01/23/2024   CREATININE 0.70 01/23/2024   CALCIUM  8.7 (L) 01/23/2024   PROT 5.7 (L) 01/23/2024   ALBUMIN 3.1 (L) 01/23/2024   AST 106 (H) 01/23/2024   ALT 92 (H) 01/23/2024   ALKPHOS 170 (H) 01/23/2024   BILITOT 0.7 01/23/2024   GFRNONAA >60 01/23/2024   GFRAA 74 05/20/2020    Lab Results  Component Value Date   CEA1 <0.6 04/13/2022   CEA 2.32 11/14/2023    Lab Results  Component Value Date   INR 1.3 (H) 12/30/2023   LABPROT 16.7 (H) 12/30/2023    Imaging:  No results found.  Medications: I have reviewed the patient's current medications.   Assessment/Plan:  Gastric  cancer with CT evidence of abdominal carcinomatosis CT abdomen/pelvis 04/13/2022-ascites, diffuse omental and peritoneal nodularity, possible circumferential mass in the transverse colon, new small left greater than right pleural effusions CT abdominal mass biopsy 04/24/2022-metastatic poorly differentiated adenocarcinoma with very focal signet ring cell features CT paracentesis 04/24/2022-no malignant cells identified Colonoscopy 04/25/2022-diverticulosis in the sigmoid colon.  Internal hemorrhoids.  No specimens collected. Upper endoscopy 04/25/2022-large infiltrative mass with oozing bleeding and stigmata of recent bleeding in the gastric fundus, on the anterior wall of the gastric body and on the greater curvature of the gastric body; deformity in the gastric antrum.  Extrinsic deformity in the entire duodenum.  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 Her2 by IHC negative, 1+; MMR IHC normal; PD-L1 CPS 1%, positive. Claudin 18 positive, 100% Cycle 1 FOLFOX 05/04/2022 Cycle 2 FOLFOX 05/17/2022 Cycle 3 FOLFOX 05/31/2022-oxaliplatin  dose reduced secondary to prolonged cold sensitivity Cycle 4 FOLFOX 06/14/2022 Cycle 5 FOLFOX 06/28/2022 CT abdomen/pelvis 07/08/2022-asymmetric wall thickening at the lesser curvature of the stomach, decreased ascites, increased diffuse omental and peritoneal nodularity Cycle 6 FOLFOX 07/12/2022 Cycle 7 FOLFOX 08/09/2022 Cycle 8 FOLFOX 08/23/2022 Cycle 9 FOLFOX 09/06/2022 CTs 09/11/2022-stable, no new or progressive interval findings Cycle 10 FOLFOX 09/20/2022 Xeloda  maintenance 2 weeks on/1 week off 10/16/2022 Cycle 2 Xeloda  11/06/2022-Xeloda  dose reduced secondary to early foot skin toxicity Cycle  3 Xeloda  11/27/2022 Cycle 4 Xeloda  12/18/2022 CTs 01/02/2023-wall thickening of the mid gastric body-possibly progressive, unchanged peritoneal/omental caking, unchanged L1 compression fracture Cycle 5 Xeloda  beginning 01/08/2023 Cycle 6 Xeloda   beginning 01/29/2023 Cycle 7 Xeloda  beginning 02/19/2023 Cycle 8 Xeloda  beginning 03/12/2023 Cycle 9 Xeloda  beginning 04/09/2023 Cycle 10 Xeloda  beginning 05/07/2023 CTs 05/21/2023: Unchanged wall thickening of the gastric body, slight increase in small volume ascites, diffuse peritoneal thickening and stranding of the omentum, no evidence of metastatic disease to the chest Cycle 11 Xeloda  beginning 05/28/2023 Cycle 12 Xeloda  beginning 06/18/2023 Cycle 13 Xeloda  beginning 07/09/2023 Cycle 14 Xeloda  beginning 07/30/2023 CTA abdomen/pelvis 08/14/2023: Large volume of heterogenous material in the gastric lumen-food/blood, small volume ascites with diffuse peritoneal thickening and matting of the bowel/omentum, enhancement of the gastric body 08/18/2023: EGD-large mass in the gastric fundus/body with no bleeding, clean-based ulcers overlying 1 area of the mass, normal duodenum Cycle 15 Xeloda  09/03/2023 Cycle 16 Xeloda  09/24/2023 CTs 10/31/2023-moderate gastric body wall thickening similar.  Ill-definition of soft tissue planes in the lesser sac and porta hepatis new or progressive.  Development of intrahepatic duct dilatation.  Increase in small volume abdominal ascites with persistent peritoneal metastasis.  New trace left pleural fluid.  New T8 heterogeneous sclerosis and vertebral body height loss. Cycle 1 FOLFOX plus zolbetuximab beginning 11/14/2023 (zolbetuximab 11/14/2023, FOLFOX 11/15/2023) Cycle 2 FOLFOX plus zolbetuximab (zolbetuximab 11/28/23, FOLFOX anticipated 11/29/23) Cycle 3 FOLFOX plus zolbetuximab 12/12/2023 Cycle 4 FOLFOX plus zolbetuximab 12/27/2023 CT abdomen/pelvis 01/01/2024: Improved lower lobe pulmonary embolism, stable gastric wall thickening, infiltrative density in the lesser sac region, tumor caking along the omentum and mesentery-stable, reduced anterior ascites stable 1.1 cm segment 4 liver lesion Cycle 5 FOLFOX plus zolbetuximab 01/10/2024 Cycle 6 FOLFOX plus zolbetuximab 01/24/2024 Truncal  rash-status post recent punch biopsy with pathology pending; biopsy reported negative per family 04/26/2022 Lichen planus of the feet Hypothyroidism Atrial fibrillation Admission 07/24/2022 with acute bilateral pulmonary embolism/right heart strain Heparin  anticoagulation 07/24/2022 Doppler 07/24/2022-acute DVT of the right femoral, popliteal, posterior tibial, and peroneal veins, acute left posterior tibial DVT Lovenox  anticoagulation, changed to once daily dosing 05/25/2023 Lovenox  changed to 40 mg daily 08/30/2023 7.  Anemia secondary to chemotherapy, phlebotomy, and hemoptysis-improved 8.  L1 compression fracture on plain x-ray 12/18/2022 MRI lumbar spine 02/02/2023-acute/subacute L1 compression fracture, mild degenerative change of the lumbar spine 9.  Admission 08/14/2023 with hematemesis 10. C. Difficile positive by PCR-completed course of Vancomycin  11.  Admission 12/29/2023 with bilateral pulmonary embolism-heparin  Dopplers 01/01/2024: Acute right common femoral and popliteal DVT 01/03/2024 heparin  discontinued, Lovenox  60 mg twice daily 12.  Diffuse pruritic rash-etiology?  Improved     Disposition: Todd Harrison appears stable.  He appears to be tolerating the FOLFOX plus zolbetuximab well.  There is no clinical evidence of disease progression.  He continues to have minimal oral intake.  He is maintained on home TPN.  We will ask the pharmacy to increase the fluid in the home TPN.  I will ask the skin care nurse to assess the sacral ulcers and skin cares.  Todd Harrison will return for another cycle of FOLFOX plus zolbetuximab tomorrow.  He will return for an office visit in 2 weeks.  We will plan for a restaging CT evaluation after 8 cycles of FOLFOX/zolbetuximab.  Arley Hof, MD  01/23/2024  8:54 AM

## 2024-01-24 ENCOUNTER — Other Ambulatory Visit (HOSPITAL_BASED_OUTPATIENT_CLINIC_OR_DEPARTMENT_OTHER): Payer: Self-pay

## 2024-01-24 ENCOUNTER — Inpatient Hospital Stay

## 2024-01-24 ENCOUNTER — Encounter: Payer: Self-pay | Admitting: Oncology

## 2024-01-24 ENCOUNTER — Other Ambulatory Visit: Payer: Self-pay

## 2024-01-24 VITALS — BP 105/64 | HR 79 | Temp 98.3°F | Resp 18

## 2024-01-24 DIAGNOSIS — Z5111 Encounter for antineoplastic chemotherapy: Secondary | ICD-10-CM | POA: Diagnosis not present

## 2024-01-24 DIAGNOSIS — C169 Malignant neoplasm of stomach, unspecified: Secondary | ICD-10-CM

## 2024-01-24 MED ORDER — DEXAMETHASONE SODIUM PHOSPHATE 10 MG/ML IJ SOLN
10.0000 mg | Freq: Once | INTRAMUSCULAR | Status: AC
  Administered 2024-01-24: 10 mg via INTRAVENOUS
  Filled 2024-01-24: qty 1

## 2024-01-24 MED ORDER — SODIUM CHLORIDE 0.9 % IV SOLN
2000.0000 mg/m2 | INTRAVENOUS | Status: DC
  Administered 2024-01-24: 4300 mg via INTRAVENOUS
  Filled 2024-01-24: qty 86

## 2024-01-24 MED ORDER — LEUCOVORIN CALCIUM INJECTION 350 MG
400.0000 mg/m2 | Freq: Once | INTRAVENOUS | Status: AC
  Administered 2024-01-24: 856 mg via INTRAVENOUS
  Filled 2024-01-24: qty 42.8

## 2024-01-24 MED ORDER — LORAZEPAM 1 MG PO TABS
0.5000 mg | ORAL_TABLET | ORAL | Status: DC | PRN
  Administered 2024-01-24: 0.5 mg via ORAL
  Filled 2024-01-24: qty 1

## 2024-01-24 MED ORDER — PROCHLORPERAZINE MALEATE 10 MG PO TABS
5.0000 mg | ORAL_TABLET | Freq: Once | ORAL | Status: AC
  Administered 2024-01-24: 5 mg via ORAL

## 2024-01-24 MED ORDER — DIPHENHYDRAMINE HCL 50 MG/ML IJ SOLN
25.0000 mg | Freq: Once | INTRAMUSCULAR | Status: AC
  Administered 2024-01-24: 25 mg via INTRAVENOUS
  Filled 2024-01-24: qty 1

## 2024-01-24 MED ORDER — PROCHLORPERAZINE MALEATE 10 MG PO TABS
5.0000 mg | ORAL_TABLET | Freq: Once | ORAL | Status: AC
  Administered 2024-01-24: 5 mg via ORAL
  Filled 2024-01-24: qty 1

## 2024-01-24 MED ORDER — DEXTROSE 5 % IV SOLN: INTRAVENOUS | Status: DC

## 2024-01-24 MED ORDER — OLANZAPINE 5 MG PO TABS
5.0000 mg | ORAL_TABLET | Freq: Once | ORAL | Status: AC
  Administered 2024-01-24: 5 mg via ORAL
  Filled 2024-01-24: qty 1

## 2024-01-24 MED ORDER — FLUOROURACIL CHEMO INJECTION 2.5 GM/50ML
400.0000 mg/m2 | Freq: Once | INTRAVENOUS | Status: AC
  Administered 2024-01-24: 850 mg via INTRAVENOUS
  Filled 2024-01-24: qty 17

## 2024-01-24 MED ORDER — FAMOTIDINE IN NACL 20-0.9 MG/50ML-% IV SOLN
20.0000 mg | Freq: Once | INTRAVENOUS | Status: AC
  Administered 2024-01-24: 20 mg via INTRAVENOUS
  Filled 2024-01-24: qty 50

## 2024-01-24 MED ORDER — OXALIPLATIN CHEMO INJECTION 100 MG/20ML
65.0000 mg/m2 | Freq: Once | INTRAVENOUS | Status: AC
  Administered 2024-01-24: 150 mg via INTRAVENOUS
  Filled 2024-01-24: qty 20

## 2024-01-24 MED ORDER — ZOLBETUXIMAB-CLZB CHEMO 100MG/5ML IV SOLN
400.0000 mg/m2 | Freq: Once | INTRAVENOUS | Status: AC
  Administered 2024-01-24: 855 mg via INTRAVENOUS
  Filled 2024-01-24: qty 42.75

## 2024-01-24 MED ORDER — APREPITANT 130 MG/18ML IV EMUL
130.0000 mg | Freq: Once | INTRAVENOUS | Status: AC
  Administered 2024-01-24: 130 mg via INTRAVENOUS
  Filled 2024-01-24: qty 18

## 2024-01-24 MED ORDER — PALONOSETRON HCL INJECTION 0.25 MG/5ML
0.2500 mg | Freq: Once | INTRAVENOUS | Status: AC
  Administered 2024-01-24: 0.25 mg via INTRAVENOUS
  Filled 2024-01-24: qty 5

## 2024-01-24 MED ORDER — SODIUM CHLORIDE 0.9 % IV SOLN: INTRAVENOUS | Status: DC

## 2024-01-24 NOTE — Patient Instructions (Signed)
 [facility] - A DEPT OF [place]  Discharge Instructions: Thank you for choosing [facility] to provide your oncology and hematology care.   If you have a lab appointment with the Cancer Center, please go directly to the Cancer Center and check in at the registration area.   Wear comfortable clothing and clothing appropriate for easy access to any Portacath or PICC line.   We strive to give you quality time with your provider. You may need to reschedule your appointment if you arrive late (15 or more minutes).  Arriving late affects you and other patients whose appointments are after yours.  Also, if you miss three or more appointments without notifying the office, you may be dismissed from the clinic at the provider's discretion.      For prescription refill requests, have your pharmacy contact our office and allow 72 hours for refills to be completed.    Today you received the following chemotherapy and/or immunotherapy agents: vyloy , oxaliplatin , leucovorin , fluorouracil     To help prevent nausea and vomiting after your treatment, we encourage you to take your nausea medication as directed.  BELOW ARE SYMPTOMS THAT SHOULD BE REPORTED IMMEDIATELY: *FEVER GREATER THAN 100.4 F (38 C) OR HIGHER *CHILLS OR SWEATING *NAUSEA AND VOMITING THAT IS NOT CONTROLLED WITH YOUR NAUSEA MEDICATION *UNUSUAL SHORTNESS OF BREATH *UNUSUAL BRUISING OR BLEEDING *URINARY PROBLEMS (pain or burning when urinating, or frequent urination) *BOWEL PROBLEMS (unusual diarrhea, constipation, pain near the anus) TENDERNESS IN MOUTH AND THROAT WITH OR WITHOUT PRESENCE OF ULCERS (sore throat, sores in mouth, or a toothache) UNUSUAL RASH, SWELLING OR PAIN  UNUSUAL VAGINAL DISCHARGE OR ITCHING   Items with * indicate a potential emergency and should be followed up as soon as possible or go to the Emergency Department if any problems should occur.  Please show the  CHEMOTHERAPY ALERT CARD or IMMUNOTHERAPY ALERT CARD at check-in to the Emergency Department and triage nurse.  Should you have questions after your visit or need to cancel or reschedule your appointment, please contact [facility] - A DEPT OF [facility]  Dept: [phone]  and follow the prompts.  Office hours are 8:00 a.m. to 4:30 p.m. Monday - Friday. Please note that voicemails left after 4:00 p.m. may not be returned until the following business day.  We are closed weekends and major holidays. You have access to a nurse at all times for urgent questions. Please call the main number to the clinic Dept: [phone] and follow the prompts.   For any non-urgent questions, you may also contact your provider using MyChart. We now offer e-Visits for anyone 74 and older to request care online for non-urgent symptoms. For details visit [email or web address].   Also download the MyChart app! Go to the app store, search MyChart, open the app, select [facility], and log in with your MyChart username and password.

## 2024-01-26 ENCOUNTER — Inpatient Hospital Stay

## 2024-01-26 ENCOUNTER — Other Ambulatory Visit (HOSPITAL_BASED_OUTPATIENT_CLINIC_OR_DEPARTMENT_OTHER): Payer: Self-pay

## 2024-01-28 ENCOUNTER — Other Ambulatory Visit (HOSPITAL_BASED_OUTPATIENT_CLINIC_OR_DEPARTMENT_OTHER): Payer: Self-pay

## 2024-01-30 ENCOUNTER — Encounter: Payer: Self-pay | Admitting: Oncology

## 2024-01-31 ENCOUNTER — Other Ambulatory Visit: Payer: Self-pay | Admitting: Sports Medicine

## 2024-02-01 ENCOUNTER — Other Ambulatory Visit: Payer: Self-pay

## 2024-02-01 ENCOUNTER — Other Ambulatory Visit (HOSPITAL_BASED_OUTPATIENT_CLINIC_OR_DEPARTMENT_OTHER): Payer: Self-pay

## 2024-02-03 ENCOUNTER — Other Ambulatory Visit: Payer: Self-pay | Admitting: Oncology

## 2024-02-05 NOTE — Progress Notes (Unsigned)
 [facility]   Telephone:([phone] Fax:(336) [phone]    Patient Care Team: Henry Ingle, MD as PCP - General (Internal Medicine) Hobart Powell BRAVO, MD (Inactive) as PCP - Cardiology (Cardiology) Cindie Ole DASEN, MD as PCP - Electrophysiology (Cardiology) Nori Sari SQUIBB, RN as Oncology Nurse Navigator   CHIEF COMPLAINT: Follow up gastric cancer   CURRENT THERAPY: FOLFOX/Zolbetuximab  INTERVAL HISTORY Todd Harrison returns for follow up as scheduled. Last seen 01/23/24  ROS   Past Medical History:  Diagnosis Date   A-fib (HCC)    Asthma    Cataract    Gastric cancer (HCC)    GERD (gastroesophageal reflux disease)    Lichen planus    Methotrexate, long term, current use    no longer taking   Psoriasis    Sleep apnea    Thyroid  disease    Urticaria      Past Surgical History:  Procedure Laterality Date   CARDIOVERSION N/A 02/23/2020   Procedure: CARDIOVERSION;  Surgeon: Santo Stanly LABOR, MD;  Location: [facility] ENDOSCOPY;  Service: Cardiovascular;  Laterality: N/A;   CARDIOVERSION N/A 04/12/2020   Procedure: CARDIOVERSION;  Surgeon: Lonni Slain, MD;  Location: [facility] ENDOSCOPY;  Service: Cardiovascular;  Laterality: N/A;   CATARACT EXTRACTION Left    2019   ESOPHAGOGASTRODUODENOSCOPY N/A 08/18/2023   Procedure: EGD (ESOPHAGOGASTRODUODENOSCOPY);  Surgeon: Legrand Victory LITTIE DOUGLAS, MD;  Location: [facility] ENDOSCOPY;  Service: Gastroenterology;  Laterality: N/A;   IR IMAGING GUIDED PORT INSERTION  05/03/2022   TONSILLECTOMY       Outpatient Encounter Medications as of 02/06/2024  Medication Sig   acetaminophen  (TYLENOL ) 500 MG tablet Take 500 mg by mouth every 6 (six) hours as needed for mild pain (pain score 1-3). (Patient not taking: Reported on 01/23/2024)   acitretin  (SORIATANE ) 25 MG capsule Take 25 mg by mouth daily. (Patient not taking: Reported on 01/23/2024)   benzonatate  (TESSALON ) 200 MG capsule Take 1 capsule (200 mg total) by mouth 3  (three) times daily as needed for cough. (Patient not taking: Reported on 01/23/2024)   clobetasol  ointment (TEMOVATE ) 0.05 % Apply 1 Application topically 2 (two) times daily as needed (feet).   dexamethasone  (DECADRON ) 4 MG tablet Take 2 tablets (8 mg total) by mouth daily. Start on the day after zolbetuximab for 3 days. Take with food.   enoxaparin  (LOVENOX ) 60 MG/0.6ML injection Inject 0.6 mLs (60 mg total) into the skin every 12 (twelve) hours.   gabapentin  (NEURONTIN ) 300 MG capsule Take 1 capsule (300 mg total) by mouth at bedtime. (Patient not taking: Reported on 01/23/2024)   guaiFENesin  (MUCINEX ) 600 MG 12 hr tablet Take 1 tablet (600 mg total) by mouth 2 (two) times daily. (Patient not taking: Reported on 01/23/2024)   hydrOXYzine  (ATARAX ) 25 MG tablet Take 1 tablet (25 mg total) by mouth every 4 (four) hours as needed for itching.   levothyroxine  (SYNTHROID ) 112 MCG tablet Take 112 mcg by mouth daily before breakfast.   lidocaine -prilocaine  (EMLA ) cream Apply 1 Application topically as needed (Apply 1 hour before coming to [facility] for accessing).   OLANZapine  (ZYPREXA ) 5 MG tablet Take 1 tablet (5 mg total) by mouth at bedtime. Start on the day after zolbetuximab for 3 days.   ondansetron  (ZOFRAN ) 8 MG tablet Take 1 tablet (8 mg total) by mouth every 8 (eight) hours as needed for nausea or vomiting. (Patient not taking: Reported on 01/23/2024)   pantoprazole  (PROTONIX ) 40 MG tablet Take 1 tablet (40  mg total) by mouth 2 (two) times daily.   polyethylene glycol (MIRALAX  / GLYCOLAX ) 17 g packet Take 17 g by mouth daily as needed for moderate constipation. (Patient not taking: Reported on 01/23/2024)   prochlorperazine  (COMPAZINE ) 10 MG tablet Take 1 tablet (10 mg total) by mouth every 6 (six) hours as needed for nausea or vomiting. (Patient not taking: Reported on 01/23/2024)   triamcinolone  (KENALOG ) 0.1 % Apply 1 application  topically daily as needed (lichen planus flare). (Patient not  taking: Reported on 01/23/2024)   No facility-administered encounter medications on file as of 02/06/2024.     There were no vitals filed for this visit. There is no height or weight on file to calculate BMI.   ECOG PERFORMANCE STATUS: {CHL ONC ECOG PS:[id number]}  PHYSICAL EXAM GENERAL:alert, no distress and comfortable SKIN: no rash  EYES: sclera clear NECK: without mass LYMPH:  no palpable cervical or supraclavicular lymphadenopathy  LUNGS: clear with normal breathing effort HEART: regular rate & rhythm, no lower extremity edema ABDOMEN: abdomen soft, non-tender and normal bowel sounds NEURO: alert & oriented x 3 with fluent speech, no focal motor/sensory deficits Breast exam:  PAC without erythema    CBC    Latest Ref Rng & Units 01/23/2024    8:12 AM 01/09/2024    3:00 PM 01/04/2024    5:40 AM  CBC  WBC 4.0 - 10.5 K/uL 5.2  6.3  4.8   Hemoglobin 13.0 - 17.0 g/dL 89.7  88.8  9.3   Hematocrit 39.0 - 52.0 % 31.4  34.3  30.5   Platelets 150 - 400 K/uL 116  168  187       CMP     Latest Ref Rng & Units 01/23/2024    8:12 AM 01/09/2024    3:00 PM 01/04/2024    5:40 AM  CMP  Glucose 70 - 99 mg/dL 883  895  883   BUN 8 - 23 mg/dL 24  22  18    Creatinine 0.61 - 1.24 mg/dL 9.29  9.25  9.37   Sodium 135 - 145 mmol/L 137  136  137   Potassium 3.5 - 5.1 mmol/L 4.5  4.3  4.6   Chloride 98 - 111 mmol/L 104  101  107   CO2 22 - 32 mmol/L 23  24  21    Calcium  8.9 - 10.3 mg/dL 8.7  9.4  8.2   Total Protein 6.5 - 8.1 g/dL 5.7  6.2    Total Bilirubin 0.0 - 1.2 mg/dL 0.7  0.6    Alkaline Phos 38 - 126 U/L 170  156    AST 15 - 41 U/L 106  24    ALT 0 - 44 U/L 92  18        ASSESSMENT & PLAN:  Gastric cancer with CT evidence of abdominal carcinomatosis CT abdomen/pelvis 04/13/2022-ascites, diffuse omental and peritoneal nodularity, possible circumferential mass in the transverse colon, new small left greater than right pleural effusions CT abdominal mass biopsy  04/24/2022-metastatic poorly differentiated adenocarcinoma with very focal signet ring cell features CT paracentesis 04/24/2022-no malignant cells identified Colonoscopy 04/25/2022-diverticulosis in the sigmoid colon.  Internal hemorrhoids.  No specimens collected. Upper endoscopy 04/25/2022-large infiltrative mass with oozing bleeding and stigmata of recent bleeding in the gastric fundus, on the anterior wall of the gastric body and on the greater curvature of the gastric body; deformity in the gastric antrum.  Extrinsic deformity in the entire duodenum.  Biopsy stomach mass-poorly differentiated adenocarcinoma with signet  ring cell features. Her2 by IHC negative, 1+; MMR IHC normal; PD-L1 CPS 1%, positive. Claudin 18 positive, 100% Cycle 1 FOLFOX 05/04/2022 Cycle 2 FOLFOX 05/17/2022 Cycle 3 FOLFOX 05/31/2022-oxaliplatin  dose reduced secondary to prolonged cold sensitivity Cycle 4 FOLFOX 06/14/2022 Cycle 5 FOLFOX 06/28/2022 CT abdomen/pelvis 07/08/2022-asymmetric wall thickening at the lesser curvature of the stomach, decreased ascites, increased diffuse omental and peritoneal nodularity Cycle 6 FOLFOX 07/12/2022 Cycle 7 FOLFOX 08/09/2022 Cycle 8 FOLFOX 08/23/2022 Cycle 9 FOLFOX 09/06/2022 CTs 09/11/2022-stable, no new or progressive interval findings Cycle 10 FOLFOX 09/20/2022 Xeloda  maintenance 2 weeks on/1 week off 10/16/2022 Cycle 2 Xeloda  11/06/2022-Xeloda  dose reduced secondary to early foot skin toxicity Cycle 3 Xeloda  11/27/2022 Cycle 4 Xeloda  12/18/2022 CTs 01/02/2023-wall thickening of the mid gastric body-possibly progressive, unchanged peritoneal/omental caking, unchanged L1 compression fracture Cycle 5 Xeloda  beginning 01/08/2023 Cycle 6 Xeloda  beginning 01/29/2023 Cycle 7 Xeloda  beginning 02/19/2023 Cycle 8 Xeloda  beginning 03/12/2023 Cycle 9 Xeloda  beginning 04/09/2023 Cycle 10 Xeloda  beginning 05/07/2023 CTs 05/21/2023: Unchanged wall thickening of the gastric body, slight increase in small volume  ascites, diffuse peritoneal thickening and stranding of the omentum, no evidence of metastatic disease to the chest Cycle 11 Xeloda  beginning 05/28/2023 Cycle 12 Xeloda  beginning 06/18/2023 Cycle 13 Xeloda  beginning 07/09/2023 Cycle 14 Xeloda  beginning 07/30/2023 CTA abdomen/pelvis 08/14/2023: Large volume of heterogenous material in the gastric lumen-food/blood, small volume ascites with diffuse peritoneal thickening and matting of the bowel/omentum, enhancement of the gastric body 08/18/2023: EGD-large mass in the gastric fundus/body with no bleeding, clean-based ulcers overlying 1 area of the mass, normal duodenum Cycle 15 Xeloda  09/03/2023 Cycle 16 Xeloda  09/24/2023 CTs 10/31/2023-moderate gastric body wall thickening similar.  Ill-definition of soft tissue planes in the lesser sac and porta hepatis new or progressive.  Development of intrahepatic duct dilatation.  Increase in small volume abdominal ascites with persistent peritoneal metastasis.  New trace left pleural fluid.  New T8 heterogeneous sclerosis and vertebral body height loss. Cycle 1 FOLFOX plus zolbetuximab beginning 11/14/2023 (zolbetuximab 11/14/2023, FOLFOX 11/15/2023) Cycle 2 FOLFOX plus zolbetuximab (zolbetuximab 11/28/23, FOLFOX anticipated 11/29/23) Cycle 3 FOLFOX plus zolbetuximab 12/12/2023 Cycle 4 FOLFOX plus zolbetuximab 12/27/2023 CT abdomen/pelvis 01/01/2024: Improved lower lobe pulmonary embolism, stable gastric wall thickening, infiltrative density in the lesser sac region, tumor caking along the omentum and mesentery-stable, reduced anterior ascites stable 1.1 cm segment 4 liver lesion Cycle 5 FOLFOX plus zolbetuximab 01/10/2024 Cycle 6 FOLFOX plus zolbetuximab 01/24/2024 Truncal rash-status post recent punch biopsy with pathology pending; biopsy reported negative per family 04/26/2022 Lichen planus of the feet Hypothyroidism Atrial fibrillation Admission 07/24/2022 with acute bilateral pulmonary embolism/right heart strain Heparin   anticoagulation 07/24/2022 Doppler 07/24/2022-acute DVT of the right femoral, popliteal, posterior tibial, and peroneal veins, acute left posterior tibial DVT Lovenox  anticoagulation, changed to once daily dosing 05/25/2023 Lovenox  changed to 40 mg daily 08/30/2023 7.  Anemia secondary to chemotherapy, phlebotomy, and hemoptysis-improved 8.  L1 compression fracture on plain x-ray 12/18/2022 MRI lumbar spine 02/02/2023-acute/subacute L1 compression fracture, mild degenerative change of the lumbar spine 9.  Admission 08/14/2023 with hematemesis 10. C. Difficile positive by PCR-completed course of Vancomycin  11.  Admission 12/29/2023 with bilateral pulmonary embolism-heparin  Dopplers 01/01/2024: Acute right common femoral and popliteal DVT 01/03/2024 heparin  discontinued, Lovenox  60 mg twice daily 12.  Diffuse pruritic rash-etiology?  Improved     PLAN:  No orders of the defined types were placed in this encounter.     All questions were answered. The patient knows to call the clinic with any problems, questions or concerns.  No barriers to learning were detected. I spent *** counseling the patient face to face. The total time spent in the appointment was *** and more than 50% was on counseling, review of test results, and coordination of care.   Todd Harrison K Monteen Toops, NP 02/05/2024 10:00 PM

## 2024-02-06 ENCOUNTER — Encounter: Payer: Self-pay | Admitting: Nurse Practitioner

## 2024-02-06 ENCOUNTER — Encounter: Payer: Self-pay | Admitting: *Deleted

## 2024-02-06 ENCOUNTER — Other Ambulatory Visit: Payer: Self-pay | Admitting: *Deleted

## 2024-02-06 ENCOUNTER — Inpatient Hospital Stay: Attending: Nurse Practitioner

## 2024-02-06 ENCOUNTER — Inpatient Hospital Stay (HOSPITAL_BASED_OUTPATIENT_CLINIC_OR_DEPARTMENT_OTHER): Admitting: Nurse Practitioner

## 2024-02-06 ENCOUNTER — Ambulatory Visit: Admitting: Nutrition

## 2024-02-06 ENCOUNTER — Inpatient Hospital Stay

## 2024-02-06 VITALS — BP 99/66 | HR 94 | Temp 98.8°F | Resp 18 | Ht 71.0 in | Wt 201.5 lb

## 2024-02-06 DIAGNOSIS — Z5111 Encounter for antineoplastic chemotherapy: Secondary | ICD-10-CM | POA: Diagnosis present

## 2024-02-06 DIAGNOSIS — C169 Malignant neoplasm of stomach, unspecified: Secondary | ICD-10-CM | POA: Diagnosis not present

## 2024-02-06 DIAGNOSIS — C786 Secondary malignant neoplasm of retroperitoneum and peritoneum: Secondary | ICD-10-CM | POA: Insufficient documentation

## 2024-02-06 DIAGNOSIS — L439 Lichen planus, unspecified: Secondary | ICD-10-CM | POA: Diagnosis not present

## 2024-02-06 DIAGNOSIS — I4891 Unspecified atrial fibrillation: Secondary | ICD-10-CM | POA: Insufficient documentation

## 2024-02-06 DIAGNOSIS — I82411 Acute embolism and thrombosis of right femoral vein: Secondary | ICD-10-CM | POA: Diagnosis not present

## 2024-02-06 DIAGNOSIS — L299 Pruritus, unspecified: Secondary | ICD-10-CM | POA: Insufficient documentation

## 2024-02-06 DIAGNOSIS — I2699 Other pulmonary embolism without acute cor pulmonale: Secondary | ICD-10-CM | POA: Insufficient documentation

## 2024-02-06 DIAGNOSIS — Z23 Encounter for immunization: Secondary | ICD-10-CM | POA: Insufficient documentation

## 2024-02-06 DIAGNOSIS — Z789 Other specified health status: Secondary | ICD-10-CM

## 2024-02-06 DIAGNOSIS — R21 Rash and other nonspecific skin eruption: Secondary | ICD-10-CM | POA: Diagnosis not present

## 2024-02-06 DIAGNOSIS — E039 Hypothyroidism, unspecified: Secondary | ICD-10-CM | POA: Diagnosis not present

## 2024-02-06 DIAGNOSIS — D6481 Anemia due to antineoplastic chemotherapy: Secondary | ICD-10-CM | POA: Diagnosis not present

## 2024-02-06 DIAGNOSIS — I82431 Acute embolism and thrombosis of right popliteal vein: Secondary | ICD-10-CM | POA: Diagnosis not present

## 2024-02-06 LAB — CMP (CANCER CENTER ONLY)
ALT: 22 U/L (ref 0–44)
AST: 31 U/L (ref 15–41)
Albumin: 3.1 g/dL — ABNORMAL LOW (ref 3.5–5.0)
Alkaline Phosphatase: 99 U/L (ref 38–126)
Anion gap: 11 (ref 5–15)
BUN: 23 mg/dL (ref 8–23)
CO2: 23 mmol/L (ref 22–32)
Calcium: 8.8 mg/dL — ABNORMAL LOW (ref 8.9–10.3)
Chloride: 102 mmol/L (ref 98–111)
Creatinine: 0.67 mg/dL (ref 0.61–1.24)
GFR, Estimated: >60 mL/min (ref >60)
Glucose, Bld: 123 mg/dL — ABNORMAL HIGH (ref 70–99)
Potassium: 4.3 mmol/L (ref 3.5–5.1)
Sodium: 136 mmol/L (ref 135–145)
Total Bilirubin: 0.7 mg/dL (ref 0.0–1.2)
Total Protein: 6 g/dL — ABNORMAL LOW (ref 6.5–8.1)

## 2024-02-06 LAB — CBC WITH DIFFERENTIAL (CANCER CENTER ONLY)
Abs Immature Granulocytes: 0.01 K/uL (ref 0.00–0.07)
Basophils Absolute: 0.1 K/uL (ref 0.0–0.1)
Basophils Relative: 1 %
Eosinophils Absolute: 0.6 K/uL — ABNORMAL HIGH (ref 0.0–0.5)
Eosinophils Relative: 10 %
HCT: 32.3 % — ABNORMAL LOW (ref 39.0–52.0)
Hemoglobin: 10.5 g/dL — ABNORMAL LOW (ref 13.0–17.0)
Immature Granulocytes: 0 %
Lymphocytes Relative: 31 %
Lymphs Abs: 1.7 K/uL (ref 0.7–4.0)
MCH: 29.7 pg (ref 26.0–34.0)
MCHC: 32.5 g/dL (ref 30.0–36.0)
MCV: 91.2 fL (ref 80.0–100.0)
Monocytes Absolute: 0.7 K/uL (ref 0.1–1.0)
Monocytes Relative: 13 %
Neutro Abs: 2.5 K/uL (ref 1.7–7.7)
Neutrophils Relative %: 45 %
Platelet Count: 147 K/uL — ABNORMAL LOW (ref 150–400)
RBC: 3.54 MIL/uL — ABNORMAL LOW (ref 4.22–5.81)
RDW: 19 % — ABNORMAL HIGH (ref 11.5–15.5)
WBC Count: 5.5 K/uL (ref 4.0–10.5)
nRBC: 0 % (ref 0.0–0.2)

## 2024-02-06 LAB — MAGNESIUM: Magnesium: 2.1 mg/dL (ref 1.7–2.4)

## 2024-02-06 LAB — PHOSPHORUS: Phosphorus: 3.8 mg/dL (ref 2.5–4.6)

## 2024-02-06 NOTE — Progress Notes (Signed)
 Patient seen by Lacie Burton, NP today  Vitals are within treatment parameters:Yes   Labs are within treatment parameters: Yes   Treatment plan has been signed: Yes   Per physician team, Patient is ready for treatment and there are NO modifications to the treatment plan. OK for treatment on 02/07/24. Will also receive IVF on 02/08/24   Faxed today's labs to [facility] for TPN dosing.

## 2024-02-06 NOTE — Progress Notes (Signed)
 Brief follow up completed with patient, wife and daughter.  Diagnosis of Metastatic Gastric cancer receiving FOLFOX/zolbetuximab.  Weight stable at 201 pounds 8 oz Oct 1   Labs include Glucose 123, Albumin 3.1, Magnesium  2.1 and Phosphorus 3.8.  Revised Estimated Nutrition Needs:  2200-2400 kcal, 110 - 130 grams protein, >2 L fluids  Skin tears and sacral ulcer healing per wife and provider.  Home nocturnal TPN with [facility] Takes oral medications with a sip of water.  No foods or other liquids due to intolerance, taste alterations, nausea/vomiting.  Concern voiced by daughter regarding episode of low blood pressure during PT visit yesterday. Patient has baseline low blood pressure however, this was lower than usual. He denied symptoms. Has occasional dizziness/vertigo but cannot see a pattern. Fluids were increased several weeks ago with nocturnal TPN to 2500 mL.  Nutrition Diagnosis: Severe Malnutrition, ongoing  Intervention: Continue home TPN PO as tolerated. Encouraged increased sips of fluid. Recommend baking soda and salt water gargle prior to trying oral intake of liquids. Suggested something tart. Consider IVF on the day after infusion.  Monitoring, Evaluation, Goals: Tolerate adequate calories and protein to minimize loss of lean body mass and support treatment tolerance.  Next Visit: To be scheduled as needed.

## 2024-02-07 ENCOUNTER — Other Ambulatory Visit

## 2024-02-07 ENCOUNTER — Inpatient Hospital Stay

## 2024-02-07 ENCOUNTER — Encounter: Payer: Self-pay | Admitting: Oncology

## 2024-02-07 ENCOUNTER — Ambulatory Visit: Admitting: Oncology

## 2024-02-07 VITALS — BP 113/72 | HR 88 | Temp 98.2°F | Resp 18

## 2024-02-07 DIAGNOSIS — Z5111 Encounter for antineoplastic chemotherapy: Secondary | ICD-10-CM | POA: Diagnosis not present

## 2024-02-07 DIAGNOSIS — C169 Malignant neoplasm of stomach, unspecified: Secondary | ICD-10-CM

## 2024-02-07 MED ORDER — DIPHENHYDRAMINE HCL 50 MG/ML IJ SOLN
25.0000 mg | Freq: Once | INTRAMUSCULAR | Status: AC
  Administered 2024-02-07: 25 mg via INTRAVENOUS
  Filled 2024-02-07: qty 1

## 2024-02-07 MED ORDER — OLANZAPINE 5 MG PO TABS
5.0000 mg | ORAL_TABLET | Freq: Once | ORAL | Status: AC
  Administered 2024-02-07: 5 mg via ORAL
  Filled 2024-02-07: qty 1

## 2024-02-07 MED ORDER — OXALIPLATIN CHEMO INJECTION 100 MG/20ML
65.0000 mg/m2 | Freq: Once | INTRAVENOUS | Status: AC
  Administered 2024-02-07: 150 mg via INTRAVENOUS
  Filled 2024-02-07: qty 20

## 2024-02-07 MED ORDER — LORAZEPAM 1 MG PO TABS: 0.5000 mg | ORAL_TABLET | ORAL | Status: DC | PRN

## 2024-02-07 MED ORDER — PALONOSETRON HCL INJECTION 0.25 MG/5ML
0.2500 mg | Freq: Once | INTRAVENOUS | Status: AC
  Administered 2024-02-07: 0.25 mg via INTRAVENOUS
  Filled 2024-02-07: qty 5

## 2024-02-07 MED ORDER — LEUCOVORIN CALCIUM INJECTION 350 MG
400.0000 mg/m2 | Freq: Once | INTRAVENOUS | Status: AC
  Administered 2024-02-07: 856 mg via INTRAVENOUS
  Filled 2024-02-07: qty 42.8

## 2024-02-07 MED ORDER — SODIUM CHLORIDE 0.9 % IV SOLN
2000.0000 mg/m2 | INTRAVENOUS | Status: DC
  Administered 2024-02-07: 4300 mg via INTRAVENOUS
  Filled 2024-02-07: qty 86

## 2024-02-07 MED ORDER — FAMOTIDINE IN NACL 20-0.9 MG/50ML-% IV SOLN
20.0000 mg | Freq: Once | INTRAVENOUS | Status: AC
  Administered 2024-02-07: 20 mg via INTRAVENOUS
  Filled 2024-02-07: qty 50

## 2024-02-07 MED ORDER — SODIUM CHLORIDE 0.9 % IV SOLN: INTRAVENOUS | Status: DC

## 2024-02-07 MED ORDER — DEXTROSE 5 % IV SOLN: INTRAVENOUS | Status: DC

## 2024-02-07 MED ORDER — DEXAMETHASONE SODIUM PHOSPHATE 10 MG/ML IJ SOLN
10.0000 mg | Freq: Once | INTRAMUSCULAR | Status: AC
  Administered 2024-02-07: 10 mg via INTRAVENOUS
  Filled 2024-02-07: qty 1

## 2024-02-07 MED ORDER — PROCHLORPERAZINE MALEATE 10 MG PO TABS
5.0000 mg | ORAL_TABLET | Freq: Once | ORAL | Status: AC
  Administered 2024-02-07: 5 mg via ORAL
  Filled 2024-02-07: qty 1

## 2024-02-07 MED ORDER — PROCHLORPERAZINE MALEATE 10 MG PO TABS
5.0000 mg | ORAL_TABLET | Freq: Once | ORAL | Status: AC
  Administered 2024-02-07: 5 mg via ORAL

## 2024-02-07 MED ORDER — APREPITANT 130 MG/18ML IV EMUL
130.0000 mg | Freq: Once | INTRAVENOUS | Status: AC
  Administered 2024-02-07: 130 mg via INTRAVENOUS
  Filled 2024-02-07: qty 18

## 2024-02-07 MED ORDER — ZOLBETUXIMAB-CLZB CHEMO 100MG/5ML IV SOLN
400.0000 mg/m2 | Freq: Once | INTRAVENOUS | Status: AC
  Administered 2024-02-07: 855 mg via INTRAVENOUS
  Filled 2024-02-07: qty 42.75

## 2024-02-07 MED ORDER — FLUOROURACIL CHEMO INJECTION 2.5 GM/50ML
400.0000 mg/m2 | Freq: Once | INTRAVENOUS | Status: AC
  Administered 2024-02-07: 850 mg via INTRAVENOUS
  Filled 2024-02-07: qty 17

## 2024-02-07 NOTE — Patient Instructions (Signed)
 [facility] - A DEPT OF [place]  Discharge Instructions: Thank you for choosing [facility] to provide your oncology and hematology care.   If you have a lab appointment with the Cancer Center, please go directly to the Cancer Center and check in at the registration area.   Wear comfortable clothing and clothing appropriate for easy access to any Portacath or PICC line.   We strive to give you quality time with your provider. You may need to reschedule your appointment if you arrive late (15 or more minutes).  Arriving late affects you and other patients whose appointments are after yours.  Also, if you miss three or more appointments without notifying the office, you may be dismissed from the clinic at the provider's discretion.      For prescription refill requests, have your pharmacy contact our office and allow 72 hours for refills to be completed.    Today you received the following chemotherapy and/or immunotherapy agents: vyloy , oxaliplatin , leucovorin , fluorouracil     To help prevent nausea and vomiting after your treatment, we encourage you to take your nausea medication as directed.  BELOW ARE SYMPTOMS THAT SHOULD BE REPORTED IMMEDIATELY: *FEVER GREATER THAN 100.4 F (38 C) OR HIGHER *CHILLS OR SWEATING *NAUSEA AND VOMITING THAT IS NOT CONTROLLED WITH YOUR NAUSEA MEDICATION *UNUSUAL SHORTNESS OF BREATH *UNUSUAL BRUISING OR BLEEDING *URINARY PROBLEMS (pain or burning when urinating, or frequent urination) *BOWEL PROBLEMS (unusual diarrhea, constipation, pain near the anus) TENDERNESS IN MOUTH AND THROAT WITH OR WITHOUT PRESENCE OF ULCERS (sore throat, sores in mouth, or a toothache) UNUSUAL RASH, SWELLING OR PAIN  UNUSUAL VAGINAL DISCHARGE OR ITCHING   Items with * indicate a potential emergency and should be followed up as soon as possible or go to the Emergency Department if any problems should occur.  Please show the  CHEMOTHERAPY ALERT CARD or IMMUNOTHERAPY ALERT CARD at check-in to the Emergency Department and triage nurse.  Should you have questions after your visit or need to cancel or reschedule your appointment, please contact [facility] - A DEPT OF [facility]  Dept: [phone]  and follow the prompts.  Office hours are 8:00 a.m. to 4:30 p.m. Monday - Friday. Please note that voicemails left after 4:00 p.m. may not be returned until the following business day.  We are closed weekends and major holidays. You have access to a nurse at all times for urgent questions. Please call the main number to the clinic Dept: [phone] and follow the prompts.   For any non-urgent questions, you may also contact your provider using MyChart. We now offer e-Visits for anyone 74 and older to request care online for non-urgent symptoms. For details visit [email or web address].   Also download the MyChart app! Go to the app store, search MyChart, open the app, select [facility], and log in with your MyChart username and password.

## 2024-02-08 ENCOUNTER — Telehealth: Payer: Self-pay | Admitting: Nurse Practitioner

## 2024-02-08 ENCOUNTER — Ambulatory Visit

## 2024-02-08 ENCOUNTER — Encounter: Payer: Self-pay | Admitting: Oncology

## 2024-02-08 ENCOUNTER — Inpatient Hospital Stay

## 2024-02-08 DIAGNOSIS — Z5111 Encounter for antineoplastic chemotherapy: Secondary | ICD-10-CM | POA: Diagnosis not present

## 2024-02-08 DIAGNOSIS — Z23 Encounter for immunization: Secondary | ICD-10-CM

## 2024-02-08 DIAGNOSIS — C169 Malignant neoplasm of stomach, unspecified: Secondary | ICD-10-CM

## 2024-02-08 MED ORDER — INFLUENZA VAC SPLIT HIGH-DOSE 0.5 ML IM SUSY
0.5000 mL | PREFILLED_SYRINGE | INTRAMUSCULAR | Status: AC
  Administered 2024-02-08: 0.5 mL via INTRAMUSCULAR
  Filled 2024-02-08: qty 0.5

## 2024-02-08 MED ORDER — SODIUM CHLORIDE 0.9 % IV SOLN: Freq: Once | INTRAVENOUS | Status: AC

## 2024-02-08 NOTE — Telephone Encounter (Signed)
 Todd Harrison mentioned to the nurse he was experiencing pruritus following each treatment.  I contacted him for more information.  He reports having baseline generalized pruritus.  He notes the pruritus increases for 3 to 4 days after each treatment, comes and goes.  He notes that Atarax  helps some.  He does not think he has a rash.  The pruritus is typically on his back or extremities.  He began post zolbetuximab dexamethasone  yesterday.  I asked him to pay particular attention to the pruritus while he is on dexamethasone  and to let us  know if the pruritus is less.  Otherwise seems to be tolerating treatment well.

## 2024-02-09 ENCOUNTER — Inpatient Hospital Stay

## 2024-02-10 ENCOUNTER — Other Ambulatory Visit: Payer: Self-pay

## 2024-02-16 ENCOUNTER — Other Ambulatory Visit: Payer: Self-pay | Admitting: Oncology

## 2024-02-20 ENCOUNTER — Encounter: Payer: Self-pay | Admitting: Nurse Practitioner

## 2024-02-20 ENCOUNTER — Inpatient Hospital Stay (HOSPITAL_BASED_OUTPATIENT_CLINIC_OR_DEPARTMENT_OTHER): Admitting: Nurse Practitioner

## 2024-02-20 ENCOUNTER — Ambulatory Visit: Admitting: Oncology

## 2024-02-20 ENCOUNTER — Inpatient Hospital Stay

## 2024-02-20 VITALS — BP 97/55 | HR 87 | Temp 98.3°F | Resp 18 | Ht 71.0 in | Wt 199.9 lb

## 2024-02-20 DIAGNOSIS — C169 Malignant neoplasm of stomach, unspecified: Secondary | ICD-10-CM | POA: Diagnosis not present

## 2024-02-20 DIAGNOSIS — Z5111 Encounter for antineoplastic chemotherapy: Secondary | ICD-10-CM | POA: Diagnosis not present

## 2024-02-20 DIAGNOSIS — Z789 Other specified health status: Secondary | ICD-10-CM

## 2024-02-20 DIAGNOSIS — C800 Disseminated malignant neoplasm, unspecified: Secondary | ICD-10-CM

## 2024-02-20 LAB — CBC WITH DIFFERENTIAL (CANCER CENTER ONLY)
Abs Immature Granulocytes: 0.01 K/uL (ref 0.00–0.07)
Basophils Absolute: 0 K/uL (ref 0.0–0.1)
Basophils Relative: 1 %
Eosinophils Absolute: 0.1 K/uL (ref 0.0–0.5)
Eosinophils Relative: 2 %
HCT: 30.5 % — ABNORMAL LOW (ref 39.0–52.0)
Hemoglobin: 9.9 g/dL — ABNORMAL LOW (ref 13.0–17.0)
Immature Granulocytes: 0 %
Lymphocytes Relative: 32 %
Lymphs Abs: 1.4 K/uL (ref 0.7–4.0)
MCH: 29.8 pg (ref 26.0–34.0)
MCHC: 32.5 g/dL (ref 30.0–36.0)
MCV: 91.9 fL (ref 80.0–100.0)
Monocytes Absolute: 0.7 K/uL (ref 0.1–1.0)
Monocytes Relative: 16 %
Neutro Abs: 2.1 K/uL (ref 1.7–7.7)
Neutrophils Relative %: 49 %
Platelet Count: 119 K/uL — ABNORMAL LOW (ref 150–400)
RBC: 3.32 MIL/uL — ABNORMAL LOW (ref 4.22–5.81)
RDW: 18 % — ABNORMAL HIGH (ref 11.5–15.5)
WBC Count: 4.3 K/uL (ref 4.0–10.5)
nRBC: 0 % (ref 0.0–0.2)

## 2024-02-20 LAB — CMP (CANCER CENTER ONLY)
ALT: 14 U/L (ref 0–44)
AST: 20 U/L (ref 15–41)
Albumin: 3.2 g/dL — ABNORMAL LOW (ref 3.5–5.0)
Alkaline Phosphatase: 76 U/L (ref 38–126)
Anion gap: 9 (ref 5–15)
BUN: 22 mg/dL (ref 8–23)
CO2: 25 mmol/L (ref 22–32)
Calcium: 8.9 mg/dL (ref 8.9–10.3)
Chloride: 104 mmol/L (ref 98–111)
Creatinine: 0.6 mg/dL — ABNORMAL LOW (ref 0.61–1.24)
GFR, Estimated: >60 mL/min (ref >60)
Glucose, Bld: 108 mg/dL — ABNORMAL HIGH (ref 70–99)
Potassium: 4.3 mmol/L (ref 3.5–5.1)
Sodium: 138 mmol/L (ref 135–145)
Total Bilirubin: 0.7 mg/dL (ref 0.0–1.2)
Total Protein: 5.7 g/dL — ABNORMAL LOW (ref 6.5–8.1)

## 2024-02-20 LAB — MAGNESIUM: Magnesium: 2.1 mg/dL (ref 1.7–2.4)

## 2024-02-20 LAB — PHOSPHORUS: Phosphorus: 3.5 mg/dL (ref 2.5–4.6)

## 2024-02-20 NOTE — Progress Notes (Signed)
 [facility] OFFICE PROGRESS NOTE   Diagnosis: Gastric cancer  INTERVAL HISTORY:   Todd Harrison returns as scheduled.  He completed another cycle of FOLFOX/zolbetuximab 02/07/2024.  No consistent nausea/vomiting.  He tends to vomit every few days.  No mouth sores.  Periodic loose stools.  No rash.  He continues to note intermittent pruritus, various areas, no rash.  He takes hydroxyzine  as needed.  He denies bleeding.  Specifically no hemoptysis.  Objective:  Vital signs in last 24 hours:  Blood pressure (!) 97/55, pulse 87, temperature 98.3 F (36.8 C), temperature source Oral, resp. rate 18, height 5' 11 (1.803 m), weight 199 lb 14.4 oz (90.7 kg), SpO2 95%.    HEENT: No thrush or ulcers. Resp: Lungs clear bilaterally. Cardio: Regular rate and rhythm. GI: No hepatosplenomegaly.  No mass. Vascular: Left lower leg is slightly larger than the right lower leg. Skin: Ecchymoses scattered over the forearms. Port-A-Cath without erythema.  Lab Results:  Lab Results  Component Value Date   WBC 4.3 02/20/2024   HGB 9.9 (L) 02/20/2024   HCT 30.5 (L) 02/20/2024   MCV 91.9 02/20/2024   PLT 119 (L) 02/20/2024   NEUTROABS 2.1 02/20/2024    Imaging:  No results found.  Medications: I have reviewed the patient's current medications.  Assessment/Plan: . Gastric cancer with CT evidence of abdominal carcinomatosis CT abdomen/pelvis 04/13/2022-ascites, diffuse omental and peritoneal nodularity, possible circumferential mass in the transverse colon, new small left greater than right pleural effusions CT abdominal mass biopsy 04/24/2022-metastatic poorly differentiated adenocarcinoma with very focal signet ring cell features CT paracentesis 04/24/2022-no malignant cells identified Colonoscopy 04/25/2022-diverticulosis in the sigmoid colon.  Internal hemorrhoids.  No specimens collected. Upper endoscopy 04/25/2022-large infiltrative mass with oozing bleeding and stigmata of  recent bleeding in the gastric fundus, on the anterior wall of the gastric body and on the greater curvature of the gastric body; deformity in the gastric antrum.  Extrinsic deformity in the entire duodenum.  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 Her2 by IHC negative, 1+; MMR IHC normal; PD-L1 CPS 1%, positive. Claudin 18 positive, 100% Cycle 1 FOLFOX 05/04/2022 Cycle 2 FOLFOX 05/17/2022 Cycle 3 FOLFOX 05/31/2022-oxaliplatin  dose reduced secondary to prolonged cold sensitivity Cycle 4 FOLFOX 06/14/2022 Cycle 5 FOLFOX 06/28/2022 CT abdomen/pelvis 07/08/2022-asymmetric wall thickening at the lesser curvature of the stomach, decreased ascites, increased diffuse omental and peritoneal nodularity Cycle 6 FOLFOX 07/12/2022 Cycle 7 FOLFOX 08/09/2022 Cycle 8 FOLFOX 08/23/2022 Cycle 9 FOLFOX 09/06/2022 CTs 09/11/2022-stable, no new or progressive interval findings Cycle 10 FOLFOX 09/20/2022 Xeloda  maintenance 2 weeks on/1 week off 10/16/2022 Cycle 2 Xeloda  11/06/2022-Xeloda  dose reduced secondary to early foot skin toxicity Cycle 3 Xeloda  11/27/2022 Cycle 4 Xeloda  12/18/2022 CTs 01/02/2023-wall thickening of the mid gastric body-possibly progressive, unchanged peritoneal/omental caking, unchanged L1 compression fracture Cycle 5 Xeloda  beginning 01/08/2023 Cycle 6 Xeloda  beginning 01/29/2023 Cycle 7 Xeloda  beginning 02/19/2023 Cycle 8 Xeloda  beginning 03/12/2023 Cycle 9 Xeloda  beginning 04/09/2023 Cycle 10 Xeloda  beginning 05/07/2023 CTs 05/21/2023: Unchanged wall thickening of the gastric body, slight increase in small volume ascites, diffuse peritoneal thickening and stranding of the omentum, no evidence of metastatic disease to the chest Cycle 11 Xeloda  beginning 05/28/2023 Cycle 12 Xeloda  beginning 06/18/2023 Cycle 13 Xeloda  beginning 07/09/2023 Cycle 14 Xeloda  beginning 07/30/2023 CTA abdomen/pelvis 08/14/2023: Large volume of heterogenous material in the gastric  lumen-food/blood, small volume ascites with diffuse peritoneal thickening and matting of the bowel/omentum, enhancement of the gastric body 08/18/2023: EGD-large mass in the  gastric fundus/body with no bleeding, clean-based ulcers overlying 1 area of the mass, normal duodenum Cycle 15 Xeloda  09/03/2023 Cycle 16 Xeloda  09/24/2023 CTs 10/31/2023-moderate gastric body wall thickening similar.  Ill-definition of soft tissue planes in the lesser sac and porta hepatis new or progressive.  Development of intrahepatic duct dilatation.  Increase in small volume abdominal ascites with persistent peritoneal metastasis.  New trace left pleural fluid.  New T8 heterogeneous sclerosis and vertebral body height loss. Cycle 1 FOLFOX plus zolbetuximab beginning 11/14/2023 (zolbetuximab 11/14/2023, FOLFOX 11/15/2023) Cycle 2 FOLFOX plus zolbetuximab (zolbetuximab 11/28/23, FOLFOX anticipated 11/29/23) Cycle 3 FOLFOX plus zolbetuximab 12/12/2023 Cycle 4 FOLFOX plus zolbetuximab 12/27/2023 CT abdomen/pelvis 01/01/2024: Improved lower lobe pulmonary embolism, stable gastric wall thickening, infiltrative density in the lesser sac region, tumor caking along the omentum and mesentery-stable, reduced anterior ascites stable 1.1 cm segment 4 liver lesion Cycle 5 FOLFOX plus zolbetuximab 01/10/2024 Cycle 6 FOLFOX plus zolbetuximab 01/24/2024 Cycle 7 FOLFOX plus zolbetuximab 02/06/2024 Cycle 8 FOLFOX plus zolbetuximab 02/21/2024   Truncal rash-status post recent punch biopsy with pathology pending; biopsy reported negative per family 04/26/2022 Lichen planus of the feet Hypothyroidism Atrial fibrillation Admission 07/24/2022 with acute bilateral pulmonary embolism/right heart strain Heparin  anticoagulation 07/24/2022 Doppler 07/24/2022-acute DVT of the right femoral, popliteal, posterior tibial, and peroneal veins, acute left posterior tibial DVT Lovenox  anticoagulation, changed to once daily dosing 05/25/2023 Lovenox  changed to 40 mg daily  08/30/2023 7.  Anemia secondary to chemotherapy, phlebotomy, and hemoptysis-improved 8.  L1 compression fracture on plain x-ray 12/18/2022 MRI lumbar spine 02/02/2023-acute/subacute L1 compression fracture, mild degenerative change of the lumbar spine 9.  Admission 08/14/2023 with hematemesis 10. C. Difficile positive by PCR-completed course of Vancomycin  11.  Admission 12/29/2023 with bilateral pulmonary embolism-heparin  Dopplers 01/01/2024: Acute right common femoral and popliteal DVT 01/03/2024 heparin  discontinued, Lovenox  60 mg twice daily 12.  Diffuse pruritic rash-etiology?  Improved    Disposition: Todd Harrison appears stable.  He has completed 7 cycles of FOLFOX plus zolbetuximab.  Overall seems to be tolerating treatment well.  Plan to proceed with cycle 8 as scheduled 02/21/2024.  Restaging CTs prior to next office visit.  CBC and chemistry panel reviewed.  Labs adequate for treatment.  He continues home TPN.  He will receive 1 L of normal saline on day 2, 02/22/2024.  He will return for follow-up in 2 weeks.  We are available to see him sooner if needed.    Todd Harrison ANP/GNP-BC   02/20/2024  9:00 AM

## 2024-02-20 NOTE — Patient Instructions (Signed)

## 2024-02-21 ENCOUNTER — Inpatient Hospital Stay

## 2024-02-21 VITALS — BP 95/62 | HR 68 | Temp 97.7°F | Resp 18

## 2024-02-21 DIAGNOSIS — Z5111 Encounter for antineoplastic chemotherapy: Secondary | ICD-10-CM | POA: Diagnosis not present

## 2024-02-21 DIAGNOSIS — C169 Malignant neoplasm of stomach, unspecified: Secondary | ICD-10-CM

## 2024-02-21 MED ORDER — DEXTROSE 5 % IV SOLN: INTRAVENOUS | Status: DC

## 2024-02-21 MED ORDER — SODIUM CHLORIDE 0.9 % IV SOLN: INTRAVENOUS | Status: DC

## 2024-02-21 MED ORDER — PROCHLORPERAZINE MALEATE 10 MG PO TABS
5.0000 mg | ORAL_TABLET | Freq: Once | ORAL | Status: AC
  Administered 2024-02-21: 5 mg via ORAL

## 2024-02-21 MED ORDER — ZOLBETUXIMAB-CLZB CHEMO 100MG/5ML IV SOLN
400.0000 mg/m2 | Freq: Once | INTRAVENOUS | Status: AC
  Administered 2024-02-21: 855 mg via INTRAVENOUS
  Filled 2024-02-21: qty 42.75

## 2024-02-21 MED ORDER — OLANZAPINE 5 MG PO TABS
5.0000 mg | ORAL_TABLET | Freq: Once | ORAL | Status: AC
  Administered 2024-02-21: 5 mg via ORAL
  Filled 2024-02-21: qty 1

## 2024-02-21 MED ORDER — PROCHLORPERAZINE MALEATE 10 MG PO TABS
5.0000 mg | ORAL_TABLET | Freq: Once | ORAL | Status: AC
  Administered 2024-02-21: 5 mg via ORAL
  Filled 2024-02-21: qty 1

## 2024-02-21 MED ORDER — DEXAMETHASONE SOD PHOSPHATE PF 10 MG/ML IJ SOLN
10.0000 mg | Freq: Once | INTRAMUSCULAR | Status: AC
  Administered 2024-02-21: 10 mg via INTRAVENOUS

## 2024-02-21 MED ORDER — PALONOSETRON HCL INJECTION 0.25 MG/5ML
0.2500 mg | Freq: Once | INTRAVENOUS | Status: AC
  Administered 2024-02-21: 0.25 mg via INTRAVENOUS
  Filled 2024-02-21: qty 5

## 2024-02-21 MED ORDER — DIPHENHYDRAMINE HCL 50 MG/ML IJ SOLN
25.0000 mg | Freq: Once | INTRAMUSCULAR | Status: AC
  Administered 2024-02-21: 25 mg via INTRAVENOUS
  Filled 2024-02-21: qty 1

## 2024-02-21 MED ORDER — FAMOTIDINE IN NACL 20-0.9 MG/50ML-% IV SOLN
20.0000 mg | Freq: Once | INTRAVENOUS | Status: AC
  Administered 2024-02-21: 20 mg via INTRAVENOUS
  Filled 2024-02-21: qty 50

## 2024-02-21 MED ORDER — LEUCOVORIN CALCIUM INJECTION 350 MG
400.0000 mg/m2 | Freq: Once | INTRAVENOUS | Status: AC
  Administered 2024-02-21: 856 mg via INTRAVENOUS
  Filled 2024-02-21: qty 42.8

## 2024-02-21 MED ORDER — LORAZEPAM 1 MG PO TABS: 0.5000 mg | ORAL_TABLET | ORAL | Status: DC | PRN

## 2024-02-21 MED ORDER — FLUOROURACIL CHEMO INJECTION 2.5 GM/50ML
400.0000 mg/m2 | Freq: Once | INTRAVENOUS | Status: AC
  Administered 2024-02-21: 850 mg via INTRAVENOUS
  Filled 2024-02-21: qty 17

## 2024-02-21 MED ORDER — OXALIPLATIN CHEMO INJECTION 100 MG/20ML
65.0000 mg/m2 | Freq: Once | INTRAVENOUS | Status: AC
  Administered 2024-02-21: 150 mg via INTRAVENOUS
  Filled 2024-02-21: qty 20

## 2024-02-21 MED ORDER — APREPITANT 130 MG/18ML IV EMUL
130.0000 mg | Freq: Once | INTRAVENOUS | Status: AC
  Administered 2024-02-21: 130 mg via INTRAVENOUS
  Filled 2024-02-21: qty 18

## 2024-02-21 MED ORDER — SODIUM CHLORIDE 0.9 % IV SOLN
2000.0000 mg/m2 | INTRAVENOUS | Status: DC
  Administered 2024-02-21: 4300 mg via INTRAVENOUS
  Filled 2024-02-21: qty 86

## 2024-02-21 NOTE — Patient Instructions (Signed)
 [facility] - A DEPT OF [place]  Discharge Instructions: Thank you for choosing [facility] to provide your oncology and hematology care.   If you have a lab appointment with the Cancer Center, please go directly to the Cancer Center and check in at the registration area.   Wear comfortable clothing and clothing appropriate for easy access to any Portacath or PICC line.   We strive to give you quality time with your provider. You may need to reschedule your appointment if you arrive late (15 or more minutes).  Arriving late affects you and other patients whose appointments are after yours.  Also, if you miss three or more appointments without notifying the office, you may be dismissed from the clinic at the provider's discretion.      For prescription refill requests, have your pharmacy contact our office and allow 72 hours for refills to be completed.    Today you received the following chemotherapy and/or immunotherapy agents: vyloy , oxaliplatin , leucovorin , fluorouracil     To help prevent nausea and vomiting after your treatment, we encourage you to take your nausea medication as directed.  BELOW ARE SYMPTOMS THAT SHOULD BE REPORTED IMMEDIATELY: *FEVER GREATER THAN 100.4 F (38 C) OR HIGHER *CHILLS OR SWEATING *NAUSEA AND VOMITING THAT IS NOT CONTROLLED WITH YOUR NAUSEA MEDICATION *UNUSUAL SHORTNESS OF BREATH *UNUSUAL BRUISING OR BLEEDING *URINARY PROBLEMS (pain or burning when urinating, or frequent urination) *BOWEL PROBLEMS (unusual diarrhea, constipation, pain near the anus) TENDERNESS IN MOUTH AND THROAT WITH OR WITHOUT PRESENCE OF ULCERS (sore throat, sores in mouth, or a toothache) UNUSUAL RASH, SWELLING OR PAIN  UNUSUAL VAGINAL DISCHARGE OR ITCHING   Items with * indicate a potential emergency and should be followed up as soon as possible or go to the Emergency Department if any problems should occur.  Please show the  CHEMOTHERAPY ALERT CARD or IMMUNOTHERAPY ALERT CARD at check-in to the Emergency Department and triage nurse.  Should you have questions after your visit or need to cancel or reschedule your appointment, please contact [facility] - A DEPT OF [facility]  Dept: [phone]  and follow the prompts.  Office hours are 8:00 a.m. to 4:30 p.m. Monday - Friday. Please note that voicemails left after 4:00 p.m. may not be returned until the following business day.  We are closed weekends and major holidays. You have access to a nurse at all times for urgent questions. Please call the main number to the clinic Dept: [phone] and follow the prompts.   For any non-urgent questions, you may also contact your provider using MyChart. We now offer e-Visits for anyone 74 and older to request care online for non-urgent symptoms. For details visit [email or web address].   Also download the MyChart app! Go to the app store, search MyChart, open the app, select [facility], and log in with your MyChart username and password.

## 2024-02-22 ENCOUNTER — Inpatient Hospital Stay

## 2024-02-22 ENCOUNTER — Telehealth: Payer: Self-pay

## 2024-02-22 ENCOUNTER — Other Ambulatory Visit: Payer: Self-pay

## 2024-02-22 VITALS — BP 88/56 | HR 71 | Temp 97.7°F | Resp 18

## 2024-02-22 DIAGNOSIS — Z5111 Encounter for antineoplastic chemotherapy: Secondary | ICD-10-CM | POA: Diagnosis not present

## 2024-02-22 DIAGNOSIS — C169 Malignant neoplasm of stomach, unspecified: Secondary | ICD-10-CM

## 2024-02-22 MED ORDER — SODIUM CHLORIDE 0.9 % IV SOLN: Freq: Once | INTRAVENOUS | Status: AC

## 2024-02-22 NOTE — Patient Instructions (Signed)

## 2024-02-22 NOTE — Telephone Encounter (Signed)
 Confirmed with patient that IVF appointment for Friday 02/22/2024 will be at 1100 and not 1245, he was agreeable.

## 2024-02-23 ENCOUNTER — Inpatient Hospital Stay: Payer: Self-pay

## 2024-02-27 ENCOUNTER — Encounter (HOSPITAL_BASED_OUTPATIENT_CLINIC_OR_DEPARTMENT_OTHER): Payer: Self-pay

## 2024-02-28 ENCOUNTER — Ambulatory Visit (HOSPITAL_BASED_OUTPATIENT_CLINIC_OR_DEPARTMENT_OTHER)
Admission: RE | Admit: 2024-02-28 | Discharge: 2024-02-28 | Disposition: A | Discharge status: Home / Self Care | Source: Ambulatory Visit | Attending: Nurse Practitioner | Admitting: Nurse Practitioner

## 2024-02-28 DIAGNOSIS — C169 Malignant neoplasm of stomach, unspecified: Secondary | ICD-10-CM | POA: Insufficient documentation

## 2024-02-28 LAB — LAB REPORT - SCANNED: EGFR: 106

## 2024-02-28 MED ORDER — IOHEXOL 300 MG/ML  SOLN
100.0000 mL | Freq: Once | INTRAMUSCULAR | Status: AC | PRN
  Administered 2024-02-28: 100 mL via INTRAVENOUS

## 2024-02-29 ENCOUNTER — Other Ambulatory Visit: Payer: Self-pay | Admitting: Oncology

## 2024-03-05 ENCOUNTER — Inpatient Hospital Stay

## 2024-03-05 ENCOUNTER — Inpatient Hospital Stay: Admitting: Nurse Practitioner

## 2024-03-05 ENCOUNTER — Other Ambulatory Visit (HOSPITAL_BASED_OUTPATIENT_CLINIC_OR_DEPARTMENT_OTHER): Payer: Self-pay

## 2024-03-05 ENCOUNTER — Encounter: Payer: Self-pay | Admitting: Nurse Practitioner

## 2024-03-05 ENCOUNTER — Other Ambulatory Visit: Payer: Self-pay | Admitting: *Deleted

## 2024-03-05 VITALS — BP 96/53 | HR 82 | Temp 98.2°F | Resp 18 | Ht 71.0 in | Wt 200.0 lb

## 2024-03-05 DIAGNOSIS — C169 Malignant neoplasm of stomach, unspecified: Secondary | ICD-10-CM | POA: Diagnosis not present

## 2024-03-05 DIAGNOSIS — Z5111 Encounter for antineoplastic chemotherapy: Secondary | ICD-10-CM | POA: Diagnosis not present

## 2024-03-05 DIAGNOSIS — C800 Disseminated malignant neoplasm, unspecified: Secondary | ICD-10-CM

## 2024-03-05 DIAGNOSIS — I2699 Other pulmonary embolism without acute cor pulmonale: Secondary | ICD-10-CM | POA: Diagnosis not present

## 2024-03-05 LAB — CBC WITH DIFFERENTIAL (CANCER CENTER ONLY)
Abs Immature Granulocytes: 0.01 K/uL (ref 0.00–0.07)
Basophils Absolute: 0.1 K/uL (ref 0.0–0.1)
Basophils Relative: 1 %
Eosinophils Absolute: 0.5 K/uL (ref 0.0–0.5)
Eosinophils Relative: 9 %
HCT: 30.9 % — ABNORMAL LOW (ref 39.0–52.0)
Hemoglobin: 10.1 g/dL — ABNORMAL LOW (ref 13.0–17.0)
Immature Granulocytes: 0 %
Lymphocytes Relative: 26 %
Lymphs Abs: 1.6 K/uL (ref 0.7–4.0)
MCH: 29.9 pg (ref 26.0–34.0)
MCHC: 32.7 g/dL (ref 30.0–36.0)
MCV: 91.4 fL (ref 80.0–100.0)
Monocytes Absolute: 0.9 K/uL (ref 0.1–1.0)
Monocytes Relative: 15 %
Neutro Abs: 3.1 K/uL (ref 1.7–7.7)
Neutrophils Relative %: 49 %
Platelet Count: 150 K/uL (ref 150–400)
RBC: 3.38 MIL/uL — ABNORMAL LOW (ref 4.22–5.81)
RDW: 16.8 % — ABNORMAL HIGH (ref 11.5–15.5)
WBC Count: 6.2 K/uL (ref 4.0–10.5)
nRBC: 0 % (ref 0.0–0.2)

## 2024-03-05 LAB — CMP (CANCER CENTER ONLY)
ALT: 12 U/L (ref 0–44)
AST: 24 U/L (ref 15–41)
Albumin: 3.2 g/dL — ABNORMAL LOW (ref 3.5–5.0)
Alkaline Phosphatase: 73 U/L (ref 38–126)
Anion gap: 8 (ref 5–15)
BUN: 22 mg/dL (ref 8–23)
CO2: 26 mmol/L (ref 22–32)
Calcium: 8.7 mg/dL — ABNORMAL LOW (ref 8.9–10.3)
Chloride: 105 mmol/L (ref 98–111)
Creatinine: 0.67 mg/dL (ref 0.61–1.24)
GFR, Estimated: >60 mL/min (ref >60)
Glucose, Bld: 113 mg/dL — ABNORMAL HIGH (ref 70–99)
Potassium: 4.4 mmol/L (ref 3.5–5.1)
Sodium: 138 mmol/L (ref 135–145)
Total Bilirubin: 0.6 mg/dL (ref 0.0–1.2)
Total Protein: 5.7 g/dL — ABNORMAL LOW (ref 6.5–8.1)

## 2024-03-05 LAB — PHOSPHORUS: Phosphorus: 3.6 mg/dL (ref 2.5–4.6)

## 2024-03-05 LAB — MAGNESIUM: Magnesium: 2.1 mg/dL (ref 1.7–2.4)

## 2024-03-05 MED ORDER — ENOXAPARIN SODIUM 60 MG/0.6ML IJ SOSY
60.0000 mg | PREFILLED_SYRINGE | Freq: Two times a day (BID) | INTRAMUSCULAR | 2 refills | Status: DC
  Filled 2024-03-05: qty 36, 30d supply, fill #0
  Filled 2024-04-05: qty 36, 30d supply, fill #1
  Filled 2024-05-06: qty 36, 30d supply, fill #2

## 2024-03-05 NOTE — Progress Notes (Signed)
 Faxed 03/05/24 labs to [facility].

## 2024-03-05 NOTE — Patient Instructions (Signed)

## 2024-03-05 NOTE — Progress Notes (Addendum)
 [facility]   [phone]) [phone] [phone]    Patient Care Team: Henry Ingle, MD as PCP - General (Internal Medicine) Hobart Powell BRAVO, MD (Inactive) as PCP - Cardiology (Cardiology) Cindie Ole DASEN, MD as PCP - Electrophysiology (Cardiology) Nori Sari SQUIBB, RN as Oncology Nurse Navigator   CHIEF COMPLAINT: Follow up gastric cancer   CURRENT THERAPY: FOLFOX/Zolbetuximab  INTERVAL HISTORY Todd Harrison returns for follow up and treatment as scheduled.  Doing well overall with no significant changes.  He has mild nausea on the day of zolbetuximab, no consistent vomiting.  Still not taking much by mouth.  Denies pain.  Bowels moving.  Has lost some feeling in the fingertips but no decreased strength or functional difficulties.  Leaves the house daily, still working part-time. Had some pain in the sternum with PT yesterday, felt something crunch.   ROS  All other systems reviewed and negative  Past Medical History:  Diagnosis Date   A-fib (HCC)    Asthma    Cataract    Gastric cancer (HCC)    GERD (gastroesophageal reflux disease)    Lichen planus    Methotrexate, long term, current use    no longer taking   Psoriasis    Sleep apnea    Thyroid  disease    Urticaria      Past Surgical History:  Procedure Laterality Date   CARDIOVERSION N/A 02/23/2020   Procedure: CARDIOVERSION;  Surgeon: Santo Stanly LABOR, MD;  Location: [facility] ENDOSCOPY;  Service: Cardiovascular;  Laterality: N/A;   CARDIOVERSION N/A 04/12/2020   Procedure: CARDIOVERSION;  Surgeon: Lonni Slain, MD;  Location: [facility] ENDOSCOPY;  Service: Cardiovascular;  Laterality: N/A;   CATARACT EXTRACTION Left    2019   ESOPHAGOGASTRODUODENOSCOPY N/A 08/18/2023   Procedure: EGD (ESOPHAGOGASTRODUODENOSCOPY);  Surgeon: Legrand Victory LITTIE DOUGLAS, MD;  Location: [facility] ENDOSCOPY;  Service: Gastroenterology;  Laterality: N/A;   IR IMAGING GUIDED PORT INSERTION  05/03/2022    TONSILLECTOMY       Outpatient Encounter Medications as of 03/05/2024  Medication Sig   dexamethasone  (DECADRON ) 4 MG tablet Take 2 tablets (8 mg total) by mouth daily. Start on the day after zolbetuximab for 3 days. Take with food.   hydrOXYzine  (ATARAX ) 25 MG tablet Take 1 tablet (25 mg total) by mouth every 4 (four) hours as needed for itching.   levothyroxine  (SYNTHROID ) 112 MCG tablet Take 112 mcg by mouth daily before breakfast.   OLANZapine  (ZYPREXA ) 5 MG tablet Take 1 tablet (5 mg total) by mouth at bedtime. Start on the day after zolbetuximab for 3 days.   pantoprazole  (PROTONIX ) 40 MG tablet Take 1 tablet (40 mg total) by mouth 2 (two) times daily.   [DISCONTINUED] enoxaparin  (LOVENOX ) 60 MG/0.6ML injection Inject 0.6 mLs (60 mg total) into the skin every 12 (twelve) hours.   acetaminophen  (TYLENOL ) 500 MG tablet Take 500 mg by mouth every 6 (six) hours as needed for mild pain (pain score 1-3). (Patient not taking: Reported on 03/05/2024)   acitretin  (SORIATANE ) 25 MG capsule Take 25 mg by mouth daily. (Patient not taking: Reported on 03/05/2024)   benzonatate  (TESSALON ) 200 MG capsule Take 1 capsule (200 mg total) by mouth 3 (three) times daily as needed for cough. (Patient not taking: Reported on 03/05/2024)   clobetasol  ointment (TEMOVATE ) 0.05 % Apply 1 Application topically 2 (two) times daily as needed (feet). (Patient not taking: Reported on 03/05/2024)   enoxaparin  (LOVENOX ) 60 MG/0.6ML injection Inject 0.6 mLs (60 mg  total) into the skin every 12 (twelve) hours.   gabapentin  (NEURONTIN ) 300 MG capsule Take 1 capsule (300 mg total) by mouth at bedtime. (Patient not taking: Reported on 03/05/2024)   guaiFENesin  (MUCINEX ) 600 MG 12 hr tablet Take 1 tablet (600 mg total) by mouth 2 (two) times daily. (Patient not taking: Reported on 03/05/2024)   lidocaine -prilocaine  (EMLA ) cream Apply 1 Application topically as needed (Apply 1 hour before coming to [facility] for accessing).  (Patient not taking: Reported on 03/05/2024)   ondansetron  (ZOFRAN ) 8 MG tablet Take 1 tablet (8 mg total) by mouth every 8 (eight) hours as needed for nausea or vomiting. (Patient not taking: Reported on 03/05/2024)   polyethylene glycol (MIRALAX  / GLYCOLAX ) 17 g packet Take 17 g by mouth daily as needed for moderate constipation. (Patient not taking: Reported on 03/05/2024)   prochlorperazine  (COMPAZINE ) 10 MG tablet Take 1 tablet (10 mg total) by mouth every 6 (six) hours as needed for nausea or vomiting. (Patient not taking: Reported on 03/05/2024)   triamcinolone  (KENALOG ) 0.1 % Apply 1 application  topically daily as needed (lichen planus flare). (Patient not taking: Reported on 03/05/2024)   No facility-administered encounter medications on file as of 03/05/2024.     Today's Vitals   03/05/24 0900 03/05/24 0946  BP:  (!) 96/53  Pulse:  82  Resp:  18  Temp:  98.2 F (36.8 C)  TempSrc:  Temporal  SpO2:  98%  Weight:  200 lb (90.7 kg)  Height:  5' 11 (1.803 m)  PainSc: 0-No pain    Body mass index is 27.89 kg/m.   ECOG PERFORMANCE STATUS: 1 - Symptomatic but completely ambulatory  PHYSICAL EXAM GENERAL:alert, no distress and comfortable SKIN: no rash.  Scattered ecchymoses HEENT sclera clear.  No thrush or ulcers NECK: without mass LYMPH:  no palpable cervical or supraclavicular lymphadenopathy  LUNGS: clear with normal breathing effort HEART: regular rate & rhythm, murmur noted, no lower extremity edema ABDOMEN: abdomen soft, non-tender and normal bowel sounds NEURO: alert & oriented x 3 with fluent speech, no focal motor deficits MSK: No sternal TTP PAC without erythema    CBC    Latest Ref Rng & Units 03/05/2024    9:30 AM 02/20/2024    8:32 AM 02/06/2024    9:37 AM  CBC  WBC 4.0 - 10.5 K/uL 6.2  4.3  5.5   Hemoglobin 13.0 - 17.0 g/dL 89.8  9.9  89.4   Hematocrit 39.0 - 52.0 % 30.9  30.5  32.3   Platelets 150 - 400 K/uL 150  119  147       CMP     Latest  Ref Rng & Units 03/05/2024    9:30 AM 02/20/2024    8:32 AM 02/06/2024    9:37 AM  CMP  Glucose 70 - 99 mg/dL 886  891  876   BUN 8 - 23 mg/dL 22  22  23    Creatinine 0.61 - 1.24 mg/dL 9.32  9.39  9.32   Sodium 135 - 145 mmol/L 138  138  136   Potassium 3.5 - 5.1 mmol/L 4.4  4.3  4.3   Chloride 98 - 111 mmol/L 105  104  102   CO2 22 - 32 mmol/L 26  25  23    Calcium  8.9 - 10.3 mg/dL 8.7  8.9  8.8   Total Protein 6.5 - 8.1 g/dL 5.7  5.7  6.0   Total Bilirubin 0.0 - 1.2 mg/dL 0.6  0.7  0.7   Alkaline Phos 38 - 126 U/L 73  76  99   AST 15 - 41 U/L 24  20  31    ALT 0 - 44 U/L 12  14  22        ASSESSMENT & PLAN:  1. Gastric cancer with CT evidence of abdominal carcinomatosis CT abdomen/pelvis 04/13/2022-ascites, diffuse omental and peritoneal nodularity, possible circumferential mass in the transverse colon, new small left greater than right pleural effusions CT abdominal mass biopsy 04/24/2022-metastatic poorly differentiated adenocarcinoma with very focal signet ring cell features CT paracentesis 04/24/2022-no malignant cells identified Colonoscopy 04/25/2022-diverticulosis in the sigmoid colon.  Internal hemorrhoids.  No specimens collected. Upper endoscopy 04/25/2022-large infiltrative mass with oozing bleeding and stigmata of recent bleeding in the gastric fundus, on the anterior wall of the gastric body and on the greater curvature of the gastric body; deformity in the gastric antrum.  Extrinsic deformity in the entire duodenum.  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 Her2 by IHC negative, 1+; MMR IHC normal; PD-L1 CPS 1%, positive. Claudin 18 positive, 100% Cycle 1 FOLFOX 05/04/2022 Cycle 2 FOLFOX 05/17/2022 Cycle 3 FOLFOX 05/31/2022-oxaliplatin  dose reduced secondary to prolonged cold sensitivity Cycle 4 FOLFOX 06/14/2022 Cycle 5 FOLFOX 06/28/2022 CT abdomen/pelvis 07/08/2022-asymmetric wall thickening at the lesser curvature of the  stomach, decreased ascites, increased diffuse omental and peritoneal nodularity Cycle 6 FOLFOX 07/12/2022 Cycle 7 FOLFOX 08/09/2022 Cycle 8 FOLFOX 08/23/2022 Cycle 9 FOLFOX 09/06/2022 CTs 09/11/2022-stable, no new or progressive interval findings Cycle 10 FOLFOX 09/20/2022 Xeloda  maintenance 2 weeks on/1 week off 10/16/2022 Cycle 2 Xeloda  11/06/2022-Xeloda  dose reduced secondary to early foot skin toxicity Cycle 3 Xeloda  11/27/2022 Cycle 4 Xeloda  12/18/2022 CTs 01/02/2023-wall thickening of the mid gastric body-possibly progressive, unchanged peritoneal/omental caking, unchanged L1 compression fracture Cycle 5 Xeloda  beginning 01/08/2023 Cycle 6 Xeloda  beginning 01/29/2023 Cycle 7 Xeloda  beginning 02/19/2023 Cycle 8 Xeloda  beginning 03/12/2023 Cycle 9 Xeloda  beginning 04/09/2023 Cycle 10 Xeloda  beginning 05/07/2023 CTs 05/21/2023: Unchanged wall thickening of the gastric body, slight increase in small volume ascites, diffuse peritoneal thickening and stranding of the omentum, no evidence of metastatic disease to the chest Cycle 11 Xeloda  beginning 05/28/2023 Cycle 12 Xeloda  beginning 06/18/2023 Cycle 13 Xeloda  beginning 07/09/2023 Cycle 14 Xeloda  beginning 07/30/2023 CTA abdomen/pelvis 08/14/2023: Large volume of heterogenous material in the gastric lumen-food/blood, small volume ascites with diffuse peritoneal thickening and matting of the bowel/omentum, enhancement of the gastric body 08/18/2023: EGD-large mass in the gastric fundus/body with no bleeding, clean-based ulcers overlying 1 area of the mass, normal duodenum Cycle 15 Xeloda  09/03/2023 Cycle 16 Xeloda  09/24/2023 CTs 10/31/2023-moderate gastric body wall thickening similar.  Ill-definition of soft tissue planes in the lesser sac and porta hepatis new or progressive.  Development of intrahepatic duct dilatation.  Increase in small volume abdominal ascites with persistent peritoneal metastasis.  New trace left pleural fluid.  New T8 heterogeneous sclerosis and  vertebral body height loss. Cycle 1 FOLFOX plus zolbetuximab beginning 11/14/2023 (zolbetuximab 11/14/2023, FOLFOX 11/15/2023) Cycle 2 FOLFOX plus zolbetuximab (zolbetuximab 11/28/23, FOLFOX anticipated 11/29/23) Cycle 3 FOLFOX plus zolbetuximab 12/12/2023 Cycle 4 FOLFOX plus zolbetuximab 12/27/2023 CT abdomen/pelvis 01/01/2024: Improved lower lobe pulmonary embolism, stable gastric wall thickening, infiltrative density in the lesser sac region, tumor caking along the omentum and mesentery-stable, reduced anterior ascites stable 1.1 cm segment 4 liver lesion Cycle 5 FOLFOX plus zolbetuximab 01/10/2024 Cycle 6 FOLFOX plus zolbetuximab 01/24/2024 Cycle 7 FOLFOX plus zolbetuximab 02/06/2024 Cycle 8 FOLFOX plus zolbetuximab 02/21/2024 CT CAP 02/28/2024:  Unchanged diffuse gastric thickening, unchanged hypodense liver lesion, slightly diminished ascites and peritoneal carcinomatosis, interval resolution of the small bilateral pleural effusions. Cycle 9 FOLFOX plus zolbetuximab 03/06/2024   Truncal rash-status post recent punch biopsy with pathology pending; biopsy reported negative per family 04/26/2022 Lichen planus of the feet Hypothyroidism Atrial fibrillation Admission 07/24/2022 with acute bilateral pulmonary embolism/right heart strain Heparin  anticoagulation 07/24/2022 Doppler 07/24/2022-acute DVT of the right femoral, popliteal, posterior tibial, and peroneal veins, acute left posterior tibial DVT Lovenox  anticoagulation, changed to once daily dosing 05/25/2023 Lovenox  changed to 40 mg daily 08/30/2023 7.  Anemia secondary to chemotherapy, phlebotomy, and hemoptysis-improved 8.  L1 compression fracture on plain x-ray 12/18/2022 MRI lumbar spine 02/02/2023-acute/subacute L1 compression fracture, mild degenerative change of the lumbar spine 9.  Admission 08/14/2023 with hematemesis 10. C. Difficile positive by PCR-completed course of Vancomycin  11.  Admission 12/29/2023 with bilateral pulmonary  embolism-heparin  Dopplers 01/01/2024: Acute right common femoral and popliteal DVT 01/03/2024 heparin  discontinued, Lovenox  60 mg twice daily 12.  Diffuse pruritic rash-etiology?  Improved     Disposition: Mr. Sciuto appears stable, tolerating FOLFOX/zolbetuximab well overall.  Side effects are adequately managed with supportive care at home.  He is able to recover and function with good performance status.  There is no clinical evidence of disease progression.  We reviewed his restaging CT which shows stable disease, no evidence of progression. There is no obvious abnormality in the sternum. We recommend to continue the current regimen and restage in 2-3 more months pending clinical picture.   Labs reviewed, adequate to proceed with cycle 9 FOLFOX plus zolbetuximab 03/06/2024 as scheduled, no dose modifications.  We will arrange IV fluids on 10/31 due to him missing TPN while connected to 5-FU pump.   He will return for follow-up and next cycle in 2 weeks, then a week off for Thanksgiving.   Patient seen with Dr. Cloretta.     All questions were answered. The patient knows to call the clinic with any problems, questions or concerns. No barriers to learning were detected.  Raliyah Montella K Sabine Tenenbaum, NP 03/05/2024   This was a shared visit with Kendyll Huettner.  Mr. Koenen was interviewed and examined.  We reviewed the restaging CT findings and images with him.  There is no clinical or radiologic evidence of disease progression.  The CTs reveal stable disease.  I recommend continuing FOLFOX/zolbetuximab.  He will complete another cycle tomorrow.  Mr. Bralley will return for an office visit and chemotherapy in 2 weeks.  He continues home TPN.  He will receive intravenous fluids while receiving chemotherapy and off of TPN.  Arvella Cloretta, MD

## 2024-03-06 ENCOUNTER — Encounter: Payer: Self-pay | Admitting: Oncology

## 2024-03-06 ENCOUNTER — Other Ambulatory Visit (HOSPITAL_BASED_OUTPATIENT_CLINIC_OR_DEPARTMENT_OTHER): Payer: Self-pay

## 2024-03-06 ENCOUNTER — Inpatient Hospital Stay

## 2024-03-06 VITALS — BP 103/60 | HR 69 | Temp 98.3°F | Resp 18

## 2024-03-06 DIAGNOSIS — C169 Malignant neoplasm of stomach, unspecified: Secondary | ICD-10-CM

## 2024-03-06 DIAGNOSIS — Z5111 Encounter for antineoplastic chemotherapy: Secondary | ICD-10-CM | POA: Diagnosis not present

## 2024-03-06 MED ORDER — PALONOSETRON HCL INJECTION 0.25 MG/5ML
0.2500 mg | Freq: Once | INTRAVENOUS | Status: AC
  Administered 2024-03-06: 0.25 mg via INTRAVENOUS
  Filled 2024-03-06: qty 5

## 2024-03-06 MED ORDER — FAMOTIDINE IN NACL 20-0.9 MG/50ML-% IV SOLN
20.0000 mg | Freq: Once | INTRAVENOUS | Status: AC
  Administered 2024-03-06: 20 mg via INTRAVENOUS
  Filled 2024-03-06: qty 50

## 2024-03-06 MED ORDER — OXALIPLATIN CHEMO INJECTION 100 MG/20ML
65.0000 mg/m2 | Freq: Once | INTRAVENOUS | Status: AC
  Administered 2024-03-06: 150 mg via INTRAVENOUS
  Filled 2024-03-06: qty 30

## 2024-03-06 MED ORDER — LEUCOVORIN CALCIUM INJECTION 350 MG
400.0000 mg/m2 | Freq: Once | INTRAVENOUS | Status: AC
  Administered 2024-03-06: 856 mg via INTRAVENOUS
  Filled 2024-03-06: qty 42.8

## 2024-03-06 MED ORDER — SODIUM CHLORIDE 0.9 % IV SOLN: INTRAVENOUS | Status: DC

## 2024-03-06 MED ORDER — DEXAMETHASONE SOD PHOSPHATE PF 10 MG/ML IJ SOLN
10.0000 mg | Freq: Once | INTRAMUSCULAR | Status: AC
  Administered 2024-03-06: 10 mg via INTRAVENOUS

## 2024-03-06 MED ORDER — ZOLBETUXIMAB-CLZB CHEMO 100MG/5ML IV SOLN
400.0000 mg/m2 | Freq: Once | INTRAVENOUS | Status: AC
  Administered 2024-03-06: 855 mg via INTRAVENOUS
  Filled 2024-03-06: qty 42.75

## 2024-03-06 MED ORDER — PROCHLORPERAZINE MALEATE 10 MG PO TABS
5.0000 mg | ORAL_TABLET | Freq: Once | ORAL | Status: AC
  Administered 2024-03-06: 5 mg via ORAL
  Filled 2024-03-06: qty 1

## 2024-03-06 MED ORDER — APREPITANT 130 MG/18ML IV EMUL
130.0000 mg | Freq: Once | INTRAVENOUS | Status: AC
  Administered 2024-03-06: 130 mg via INTRAVENOUS
  Filled 2024-03-06: qty 18

## 2024-03-06 MED ORDER — DIPHENHYDRAMINE HCL 50 MG/ML IJ SOLN
25.0000 mg | Freq: Once | INTRAMUSCULAR | Status: AC
  Administered 2024-03-06: 25 mg via INTRAVENOUS
  Filled 2024-03-06: qty 1

## 2024-03-06 MED ORDER — FLUOROURACIL CHEMO INJECTION 2.5 GM/50ML
400.0000 mg/m2 | Freq: Once | INTRAVENOUS | Status: AC
  Administered 2024-03-06: 850 mg via INTRAVENOUS
  Filled 2024-03-06: qty 17

## 2024-03-06 MED ORDER — DEXTROSE 5 % IV SOLN: INTRAVENOUS | Status: DC

## 2024-03-06 MED ORDER — SODIUM CHLORIDE 0.9 % IV SOLN
2000.0000 mg/m2 | INTRAVENOUS | Status: DC
  Administered 2024-03-06: 4300 mg via INTRAVENOUS
  Filled 2024-03-06: qty 86

## 2024-03-06 MED ORDER — OLANZAPINE 5 MG PO TABS
5.0000 mg | ORAL_TABLET | Freq: Once | ORAL | Status: AC
  Administered 2024-03-06: 5 mg via ORAL
  Filled 2024-03-06: qty 1

## 2024-03-06 NOTE — Patient Instructions (Signed)
 [facility] - A DEPT OF [place]  Discharge Instructions: Thank you for choosing [facility] to provide your oncology and hematology care.   If you have a lab appointment with the Cancer Center, please go directly to the Cancer Center and check in at the registration area.   Wear comfortable clothing and clothing appropriate for easy access to any Portacath or PICC line.   We strive to give you quality time with your provider. You may need to reschedule your appointment if you arrive late (15 or more minutes).  Arriving late affects you and other patients whose appointments are after yours.  Also, if you miss three or more appointments without notifying the office, you may be dismissed from the clinic at the provider's discretion.      For prescription refill requests, have your pharmacy contact our office and allow 72 hours for refills to be completed.    Today you received the following chemotherapy and/or immunotherapy agents: vyloy , oxaliplatin , leucovorin , fluorouracil     To help prevent nausea and vomiting after your treatment, we encourage you to take your nausea medication as directed.  BELOW ARE SYMPTOMS THAT SHOULD BE REPORTED IMMEDIATELY: *FEVER GREATER THAN 100.4 F (38 C) OR HIGHER *CHILLS OR SWEATING *NAUSEA AND VOMITING THAT IS NOT CONTROLLED WITH YOUR NAUSEA MEDICATION *UNUSUAL SHORTNESS OF BREATH *UNUSUAL BRUISING OR BLEEDING *URINARY PROBLEMS (pain or burning when urinating, or frequent urination) *BOWEL PROBLEMS (unusual diarrhea, constipation, pain near the anus) TENDERNESS IN MOUTH AND THROAT WITH OR WITHOUT PRESENCE OF ULCERS (sore throat, sores in mouth, or a toothache) UNUSUAL RASH, SWELLING OR PAIN  UNUSUAL VAGINAL DISCHARGE OR ITCHING   Items with * indicate a potential emergency and should be followed up as soon as possible or go to the Emergency Department if any problems should occur.  Please show the  CHEMOTHERAPY ALERT CARD or IMMUNOTHERAPY ALERT CARD at check-in to the Emergency Department and triage nurse.  Should you have questions after your visit or need to cancel or reschedule your appointment, please contact [facility] - A DEPT OF [facility]  Dept: [phone]  and follow the prompts.  Office hours are 8:00 a.m. to 4:30 p.m. Monday - Friday. Please note that voicemails left after 4:00 p.m. may not be returned until the following business day.  We are closed weekends and major holidays. You have access to a nurse at all times for urgent questions. Please call the main number to the clinic Dept: [phone] and follow the prompts.   For any non-urgent questions, you may also contact your provider using MyChart. We now offer e-Visits for anyone 74 and older to request care online for non-urgent symptoms. For details visit [email or web address].   Also download the MyChart app! Go to the app store, search MyChart, open the app, select [facility], and log in with your MyChart username and password.

## 2024-03-07 ENCOUNTER — Inpatient Hospital Stay

## 2024-03-07 ENCOUNTER — Other Ambulatory Visit: Payer: Self-pay

## 2024-03-07 VITALS — BP 89/52 | HR 68 | Temp 99.1°F | Resp 18

## 2024-03-07 DIAGNOSIS — C169 Malignant neoplasm of stomach, unspecified: Secondary | ICD-10-CM

## 2024-03-07 DIAGNOSIS — Z5111 Encounter for antineoplastic chemotherapy: Secondary | ICD-10-CM | POA: Diagnosis not present

## 2024-03-07 MED ORDER — SODIUM CHLORIDE 0.9 % IV SOLN: Freq: Once | INTRAVENOUS | Status: AC

## 2024-03-07 NOTE — Patient Instructions (Signed)

## 2024-03-08 ENCOUNTER — Inpatient Hospital Stay: Attending: Nurse Practitioner

## 2024-03-08 DIAGNOSIS — Z86711 Personal history of pulmonary embolism: Secondary | ICD-10-CM | POA: Insufficient documentation

## 2024-03-08 DIAGNOSIS — C786 Secondary malignant neoplasm of retroperitoneum and peritoneum: Secondary | ICD-10-CM | POA: Insufficient documentation

## 2024-03-08 DIAGNOSIS — Z5111 Encounter for antineoplastic chemotherapy: Secondary | ICD-10-CM | POA: Insufficient documentation

## 2024-03-08 DIAGNOSIS — L299 Pruritus, unspecified: Secondary | ICD-10-CM | POA: Insufficient documentation

## 2024-03-08 DIAGNOSIS — Z86718 Personal history of other venous thrombosis and embolism: Secondary | ICD-10-CM | POA: Insufficient documentation

## 2024-03-08 DIAGNOSIS — Z7901 Long term (current) use of anticoagulants: Secondary | ICD-10-CM | POA: Insufficient documentation

## 2024-03-08 DIAGNOSIS — D6481 Anemia due to antineoplastic chemotherapy: Secondary | ICD-10-CM | POA: Insufficient documentation

## 2024-03-08 DIAGNOSIS — C169 Malignant neoplasm of stomach, unspecified: Secondary | ICD-10-CM | POA: Insufficient documentation

## 2024-03-11 ENCOUNTER — Encounter: Payer: Self-pay | Admitting: Oncology

## 2024-03-12 ENCOUNTER — Other Ambulatory Visit: Payer: Self-pay

## 2024-03-13 ENCOUNTER — Other Ambulatory Visit: Payer: Self-pay | Admitting: Oncology

## 2024-03-19 ENCOUNTER — Inpatient Hospital Stay

## 2024-03-19 ENCOUNTER — Encounter: Payer: Self-pay | Admitting: Nurse Practitioner

## 2024-03-19 ENCOUNTER — Inpatient Hospital Stay: Admitting: Nurse Practitioner

## 2024-03-19 VITALS — BP 97/64 | HR 83 | Temp 98.1°F | Resp 15 | Ht 71.0 in | Wt 201.3 lb

## 2024-03-19 DIAGNOSIS — Z7901 Long term (current) use of anticoagulants: Secondary | ICD-10-CM | POA: Diagnosis not present

## 2024-03-19 DIAGNOSIS — Z86711 Personal history of pulmonary embolism: Secondary | ICD-10-CM | POA: Diagnosis not present

## 2024-03-19 DIAGNOSIS — C169 Malignant neoplasm of stomach, unspecified: Secondary | ICD-10-CM

## 2024-03-19 DIAGNOSIS — Z86718 Personal history of other venous thrombosis and embolism: Secondary | ICD-10-CM | POA: Diagnosis not present

## 2024-03-19 DIAGNOSIS — L299 Pruritus, unspecified: Secondary | ICD-10-CM | POA: Diagnosis not present

## 2024-03-19 DIAGNOSIS — C786 Secondary malignant neoplasm of retroperitoneum and peritoneum: Secondary | ICD-10-CM | POA: Diagnosis not present

## 2024-03-19 DIAGNOSIS — D6481 Anemia due to antineoplastic chemotherapy: Secondary | ICD-10-CM | POA: Diagnosis not present

## 2024-03-19 DIAGNOSIS — Z5111 Encounter for antineoplastic chemotherapy: Secondary | ICD-10-CM | POA: Diagnosis present

## 2024-03-19 LAB — CBC WITH DIFFERENTIAL (CANCER CENTER ONLY)
Abs Immature Granulocytes: 0.01 K/uL (ref 0.00–0.07)
Basophils Absolute: 0.1 K/uL (ref 0.0–0.1)
Basophils Relative: 1 %
Eosinophils Absolute: 0.4 K/uL (ref 0.0–0.5)
Eosinophils Relative: 8 %
HCT: 30.9 % — ABNORMAL LOW (ref 39.0–52.0)
Hemoglobin: 10.2 g/dL — ABNORMAL LOW (ref 13.0–17.0)
Immature Granulocytes: 0 %
Lymphocytes Relative: 32 %
Lymphs Abs: 1.6 K/uL (ref 0.7–4.0)
MCH: 30.1 pg (ref 26.0–34.0)
MCHC: 33 g/dL (ref 30.0–36.0)
MCV: 91.2 fL (ref 80.0–100.0)
Monocytes Absolute: 0.6 K/uL (ref 0.1–1.0)
Monocytes Relative: 13 %
Neutro Abs: 2.4 K/uL (ref 1.7–7.7)
Neutrophils Relative %: 46 %
Platelet Count: 135 K/uL — ABNORMAL LOW (ref 150–400)
RBC: 3.39 MIL/uL — ABNORMAL LOW (ref 4.22–5.81)
RDW: 15.5 % (ref 11.5–15.5)
WBC Count: 5.1 K/uL (ref 4.0–10.5)
nRBC: 0 % (ref 0.0–0.2)

## 2024-03-19 LAB — CMP (CANCER CENTER ONLY)
ALT: 25 U/L (ref 0–44)
AST: 34 U/L (ref 15–41)
Albumin: 3.1 g/dL — ABNORMAL LOW (ref 3.5–5.0)
Alkaline Phosphatase: 75 U/L (ref 38–126)
Anion gap: 9 (ref 5–15)
BUN: 22 mg/dL (ref 8–23)
CO2: 25 mmol/L (ref 22–32)
Calcium: 8.9 mg/dL (ref 8.9–10.3)
Chloride: 105 mmol/L (ref 98–111)
Creatinine: 0.63 mg/dL (ref 0.61–1.24)
GFR, Estimated: >60 mL/min (ref >60)
Glucose, Bld: 111 mg/dL — ABNORMAL HIGH (ref 70–99)
Potassium: 4.2 mmol/L (ref 3.5–5.1)
Sodium: 139 mmol/L (ref 135–145)
Total Bilirubin: 0.6 mg/dL (ref 0.0–1.2)
Total Protein: 5.9 g/dL — ABNORMAL LOW (ref 6.5–8.1)

## 2024-03-19 LAB — MAGNESIUM: Magnesium: 2.1 mg/dL (ref 1.7–2.4)

## 2024-03-19 LAB — PHOSPHORUS: Phosphorus: 3.4 mg/dL (ref 2.5–4.6)

## 2024-03-19 NOTE — Progress Notes (Signed)
 [facility] OFFICE PROGRESS NOTE   Diagnosis: Gastric cancer  INTERVAL HISTORY:   Todd Harrison returns as scheduled.  He completed cycle 9 FOLFOX plus zolbetuximab 03/06/2024.  He denies significant nausea following treatment.  The nausea he does experience he feels is due to phlegm.  No mouth sores.  No diarrhea.  No rash.  Occasional pruritus.  Cold sensitivity typically lasts at least 2 days.  He denies persistent neuropathy symptoms.  No bleeding.  Specifically no hemoptysis.  He continues TPN.  Objective:  Vital signs in last 24 hours:  Blood pressure 97/64, pulse 83, temperature 98.1 F (36.7 C), temperature source Oral, resp. rate 15, height 5' 11 (1.803 m), weight 201 lb 4.8 oz (91.3 kg), SpO2 100%.    HEENT: No thrush or ulcers. Resp: Lungs clear bilaterally. Cardio: Regular rate and rhythm. GI: No hepatosplenomegaly.  No mass. Vascular: No leg edema.  Left lower leg is slightly larger than the right lower leg. Skin: Palms without erythema. Port-A-Cath without erythema.  Lab Results:  Lab Results  Component Value Date   WBC 5.1 03/19/2024   HGB 10.2 (L) 03/19/2024   HCT 30.9 (L) 03/19/2024   MCV 91.2 03/19/2024   PLT 135 (L) 03/19/2024   NEUTROABS 2.4 03/19/2024    Imaging:  No results found.  Medications: I have reviewed the patient's current medications.  Assessment/Plan: 1. Gastric cancer with CT evidence of abdominal carcinomatosis CT abdomen/pelvis 04/13/2022-ascites, diffuse omental and peritoneal nodularity, possible circumferential mass in the transverse colon, new small left greater than right pleural effusions CT abdominal mass biopsy 04/24/2022-metastatic poorly differentiated adenocarcinoma with very focal signet ring cell features CT paracentesis 04/24/2022-no malignant cells identified Colonoscopy 04/25/2022-diverticulosis in the sigmoid colon.  Internal hemorrhoids.  No specimens collected. Upper endoscopy 04/25/2022-large  infiltrative mass with oozing bleeding and stigmata of recent bleeding in the gastric fundus, on the anterior wall of the gastric body and on the greater curvature of the gastric body; deformity in the gastric antrum.  Extrinsic deformity in the entire duodenum.  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 Her2 by IHC negative, 1+; MMR IHC normal; PD-L1 CPS 1%, positive. Claudin 18 positive, 100% Cycle 1 FOLFOX 05/04/2022 Cycle 2 FOLFOX 05/17/2022 Cycle 3 FOLFOX 05/31/2022-oxaliplatin  dose reduced secondary to prolonged cold sensitivity Cycle 4 FOLFOX 06/14/2022 Cycle 5 FOLFOX 06/28/2022 CT abdomen/pelvis 07/08/2022-asymmetric wall thickening at the lesser curvature of the stomach, decreased ascites, increased diffuse omental and peritoneal nodularity Cycle 6 FOLFOX 07/12/2022 Cycle 7 FOLFOX 08/09/2022 Cycle 8 FOLFOX 08/23/2022 Cycle 9 FOLFOX 09/06/2022 CTs 09/11/2022-stable, no new or progressive interval findings Cycle 10 FOLFOX 09/20/2022 Xeloda  maintenance 2 weeks on/1 week off 10/16/2022 Cycle 2 Xeloda  11/06/2022-Xeloda  dose reduced secondary to early foot skin toxicity Cycle 3 Xeloda  11/27/2022 Cycle 4 Xeloda  12/18/2022 CTs 01/02/2023-wall thickening of the mid gastric body-possibly progressive, unchanged peritoneal/omental caking, unchanged L1 compression fracture Cycle 5 Xeloda  beginning 01/08/2023 Cycle 6 Xeloda  beginning 01/29/2023 Cycle 7 Xeloda  beginning 02/19/2023 Cycle 8 Xeloda  beginning 03/12/2023 Cycle 9 Xeloda  beginning 04/09/2023 Cycle 10 Xeloda  beginning 05/07/2023 CTs 05/21/2023: Unchanged wall thickening of the gastric body, slight increase in small volume ascites, diffuse peritoneal thickening and stranding of the omentum, no evidence of metastatic disease to the chest Cycle 11 Xeloda  beginning 05/28/2023 Cycle 12 Xeloda  beginning 06/18/2023 Cycle 13 Xeloda  beginning 07/09/2023 Cycle 14 Xeloda  beginning 07/30/2023 CTA abdomen/pelvis 08/14/2023: Large volume  of heterogenous material in the gastric lumen-food/blood, small volume ascites with diffuse peritoneal thickening and matting of  the bowel/omentum, enhancement of the gastric body 08/18/2023: EGD-large mass in the gastric fundus/body with no bleeding, clean-based ulcers overlying 1 area of the mass, normal duodenum Cycle 15 Xeloda  09/03/2023 Cycle 16 Xeloda  09/24/2023 CTs 10/31/2023-moderate gastric body wall thickening similar.  Ill-definition of soft tissue planes in the lesser sac and porta hepatis new or progressive.  Development of intrahepatic duct dilatation.  Increase in small volume abdominal ascites with persistent peritoneal metastasis.  New trace left pleural fluid.  New T8 heterogeneous sclerosis and vertebral body height loss. Cycle 1 FOLFOX plus zolbetuximab beginning 11/14/2023 (zolbetuximab 11/14/2023, FOLFOX 11/15/2023) Cycle 2 FOLFOX plus zolbetuximab (zolbetuximab 11/28/23, FOLFOX anticipated 11/29/23) Cycle 3 FOLFOX plus zolbetuximab 12/12/2023 Cycle 4 FOLFOX plus zolbetuximab 12/27/2023 CT abdomen/pelvis 01/01/2024: Improved lower lobe pulmonary embolism, stable gastric wall thickening, infiltrative density in the lesser sac region, tumor caking along the omentum and mesentery-stable, reduced anterior ascites stable 1.1 cm segment 4 liver lesion Cycle 5 FOLFOX plus zolbetuximab 01/10/2024 Cycle 6 FOLFOX plus zolbetuximab 01/24/2024 Cycle 7 FOLFOX plus zolbetuximab 02/06/2024 Cycle 8 FOLFOX plus zolbetuximab 02/21/2024 CT CAP 02/28/2024: Unchanged diffuse gastric thickening, unchanged hypodense liver lesion, slightly diminished ascites and peritoneal carcinomatosis, interval resolution of the small bilateral pleural effusions. Cycle 9 FOLFOX plus zolbetuximab 03/06/2024 Cycle 10 FOLFOX plus zolbetuximab 03/20/2024   Truncal rash-status post recent punch biopsy with pathology pending; biopsy reported negative per family 04/26/2022 Lichen planus of the feet Hypothyroidism Atrial  fibrillation Admission 07/24/2022 with acute bilateral pulmonary embolism/right heart strain Heparin  anticoagulation 07/24/2022 Doppler 07/24/2022-acute DVT of the right femoral, popliteal, posterior tibial, and peroneal veins, acute left posterior tibial DVT Lovenox  anticoagulation, changed to once daily dosing 05/25/2023 Lovenox  changed to 40 mg daily 08/30/2023 7.  Anemia secondary to chemotherapy, phlebotomy, and hemoptysis-improved 8.  L1 compression fracture on plain x-ray 12/18/2022 MRI lumbar spine 02/02/2023-acute/subacute L1 compression fracture, mild degenerative change of the lumbar spine 9.  Admission 08/14/2023 with hematemesis 10. C. Difficile positive by PCR-completed course of Vancomycin  11.  Admission 12/29/2023 with bilateral pulmonary embolism-heparin  Dopplers 01/01/2024: Acute right common femoral and popliteal DVT 01/03/2024 heparin  discontinued, Lovenox  60 mg twice daily 12.  Diffuse pruritic rash-etiology?  Improved  Disposition: Todd Harrison appears stable.  He continues FOLFOX plus zolbetuximab.  He is tolerating treatment well.  There is no clinical evidence of disease progression.  Plan to proceed with cycle 10 as scheduled 03/20/2024.  IV fluids 03/21/2024.  He will return for follow-up and treatment 04/09/2024.  We are available to see him sooner if needed.    Olam Ned ANP/GNP-BC   03/19/2024  2:12 PM

## 2024-03-19 NOTE — Patient Instructions (Signed)

## 2024-03-20 ENCOUNTER — Inpatient Hospital Stay

## 2024-03-20 VITALS — BP 104/52 | HR 69 | Temp 98.1°F | Resp 18

## 2024-03-20 DIAGNOSIS — Z5111 Encounter for antineoplastic chemotherapy: Secondary | ICD-10-CM | POA: Diagnosis not present

## 2024-03-20 DIAGNOSIS — C169 Malignant neoplasm of stomach, unspecified: Secondary | ICD-10-CM

## 2024-03-20 MED ORDER — LEUCOVORIN CALCIUM INJECTION 350 MG
400.0000 mg/m2 | Freq: Once | INTRAVENOUS | Status: AC
  Administered 2024-03-20: 856 mg via INTRAVENOUS
  Filled 2024-03-20: qty 42.8

## 2024-03-20 MED ORDER — LORAZEPAM 1 MG PO TABS: 0.5000 mg | ORAL_TABLET | ORAL | Status: DC | PRN

## 2024-03-20 MED ORDER — APREPITANT 130 MG/18ML IV EMUL
130.0000 mg | Freq: Once | INTRAVENOUS | Status: AC
  Administered 2024-03-20: 130 mg via INTRAVENOUS
  Filled 2024-03-20: qty 18

## 2024-03-20 MED ORDER — OXALIPLATIN CHEMO INJECTION 100 MG/20ML
65.0000 mg/m2 | Freq: Once | INTRAVENOUS | Status: AC
  Administered 2024-03-20: 150 mg via INTRAVENOUS
  Filled 2024-03-20: qty 20

## 2024-03-20 MED ORDER — SODIUM CHLORIDE 0.9 % IV SOLN
2000.0000 mg/m2 | INTRAVENOUS | Status: DC
  Administered 2024-03-20: 4300 mg via INTRAVENOUS
  Filled 2024-03-20: qty 86

## 2024-03-20 MED ORDER — PROCHLORPERAZINE MALEATE 10 MG PO TABS
5.0000 mg | ORAL_TABLET | Freq: Once | ORAL | Status: AC
  Administered 2024-03-20: 5 mg via ORAL

## 2024-03-20 MED ORDER — PROCHLORPERAZINE MALEATE 10 MG PO TABS
5.0000 mg | ORAL_TABLET | Freq: Once | ORAL | Status: AC
  Administered 2024-03-20: 5 mg via ORAL
  Filled 2024-03-20: qty 1

## 2024-03-20 MED ORDER — DEXAMETHASONE SOD PHOSPHATE PF 10 MG/ML IJ SOLN
10.0000 mg | Freq: Once | INTRAMUSCULAR | Status: AC
  Administered 2024-03-20: 10 mg via INTRAVENOUS

## 2024-03-20 MED ORDER — SODIUM CHLORIDE 0.9 % IV SOLN: INTRAVENOUS | Status: DC

## 2024-03-20 MED ORDER — FAMOTIDINE IN NACL 20-0.9 MG/50ML-% IV SOLN
20.0000 mg | Freq: Once | INTRAVENOUS | Status: AC
  Administered 2024-03-20: 20 mg via INTRAVENOUS
  Filled 2024-03-20: qty 50

## 2024-03-20 MED ORDER — OLANZAPINE 5 MG PO TABS
5.0000 mg | ORAL_TABLET | Freq: Once | ORAL | Status: AC
  Administered 2024-03-20: 5 mg via ORAL
  Filled 2024-03-20: qty 1

## 2024-03-20 MED ORDER — PALONOSETRON HCL INJECTION 0.25 MG/5ML
0.2500 mg | Freq: Once | INTRAVENOUS | Status: AC
  Administered 2024-03-20: 0.25 mg via INTRAVENOUS
  Filled 2024-03-20: qty 5

## 2024-03-20 MED ORDER — FLUOROURACIL CHEMO INJECTION 2.5 GM/50ML
400.0000 mg/m2 | Freq: Once | INTRAVENOUS | Status: AC
  Administered 2024-03-20: 850 mg via INTRAVENOUS
  Filled 2024-03-20: qty 17

## 2024-03-20 MED ORDER — DIPHENHYDRAMINE HCL 50 MG/ML IJ SOLN
25.0000 mg | Freq: Once | INTRAMUSCULAR | Status: AC
  Administered 2024-03-20: 25 mg via INTRAVENOUS
  Filled 2024-03-20: qty 1

## 2024-03-20 MED ORDER — DEXTROSE 5 % IV SOLN: INTRAVENOUS | Status: DC

## 2024-03-20 MED ORDER — ZOLBETUXIMAB-CLZB CHEMO 100MG/5ML IV SOLN
400.0000 mg/m2 | Freq: Once | INTRAVENOUS | Status: AC
  Administered 2024-03-20: 855 mg via INTRAVENOUS
  Filled 2024-03-20: qty 42.75

## 2024-03-20 NOTE — Patient Instructions (Signed)
 [facility] - A DEPT OF [place]  Discharge Instructions: Thank you for choosing [facility] to provide your oncology and hematology care.   If you have a lab appointment with the Cancer Center, please go directly to the Cancer Center and check in at the registration area.   Wear comfortable clothing and clothing appropriate for easy access to any Portacath or PICC line.   We strive to give you quality time with your provider. You may need to reschedule your appointment if you arrive late (15 or more minutes).  Arriving late affects you and other patients whose appointments are after yours.  Also, if you miss three or more appointments without notifying the office, you may be dismissed from the clinic at the provider's discretion.      For prescription refill requests, have your pharmacy contact our office and allow 72 hours for refills to be completed.    Today you received the following chemotherapy and/or immunotherapy agents: vyloy , oxaliplatin , leucovorin , fluorouracil     To help prevent nausea and vomiting after your treatment, we encourage you to take your nausea medication as directed.  BELOW ARE SYMPTOMS THAT SHOULD BE REPORTED IMMEDIATELY: *FEVER GREATER THAN 100.4 F (38 C) OR HIGHER *CHILLS OR SWEATING *NAUSEA AND VOMITING THAT IS NOT CONTROLLED WITH YOUR NAUSEA MEDICATION *UNUSUAL SHORTNESS OF BREATH *UNUSUAL BRUISING OR BLEEDING *URINARY PROBLEMS (pain or burning when urinating, or frequent urination) *BOWEL PROBLEMS (unusual diarrhea, constipation, pain near the anus) TENDERNESS IN MOUTH AND THROAT WITH OR WITHOUT PRESENCE OF ULCERS (sore throat, sores in mouth, or a toothache) UNUSUAL RASH, SWELLING OR PAIN  UNUSUAL VAGINAL DISCHARGE OR ITCHING   Items with * indicate a potential emergency and should be followed up as soon as possible or go to the Emergency Department if any problems should occur.  Please show the  CHEMOTHERAPY ALERT CARD or IMMUNOTHERAPY ALERT CARD at check-in to the Emergency Department and triage nurse.  Should you have questions after your visit or need to cancel or reschedule your appointment, please contact [facility] - A DEPT OF [facility]  Dept: [phone]  and follow the prompts.  Office hours are 8:00 a.m. to 4:30 p.m. Monday - Friday. Please note that voicemails left after 4:00 p.m. may not be returned until the following business day.  We are closed weekends and major holidays. You have access to a nurse at all times for urgent questions. Please call the main number to the clinic Dept: [phone] and follow the prompts.   For any non-urgent questions, you may also contact your provider using MyChart. We now offer e-Visits for anyone 74 and older to request care online for non-urgent symptoms. For details visit [email or web address].   Also download the MyChart app! Go to the app store, search MyChart, open the app, select [facility], and log in with your MyChart username and password.

## 2024-03-20 NOTE — Progress Notes (Signed)
 Pt completed Vyloy  titration without adverse events. Has begun oxaliplatin  infusion without complaint, up ambulating to restroom and working on his ipad. He did request an additional 5mg  of compazine  at the start of the oxaliplatin  due to nausea with past administrations.

## 2024-03-21 ENCOUNTER — Other Ambulatory Visit: Payer: Self-pay

## 2024-03-21 ENCOUNTER — Inpatient Hospital Stay

## 2024-03-21 ENCOUNTER — Other Ambulatory Visit: Payer: Self-pay | Admitting: *Deleted

## 2024-03-21 VITALS — BP 88/52 | HR 74 | Temp 98.1°F | Resp 18

## 2024-03-21 DIAGNOSIS — Z5111 Encounter for antineoplastic chemotherapy: Secondary | ICD-10-CM | POA: Diagnosis not present

## 2024-03-21 DIAGNOSIS — C169 Malignant neoplasm of stomach, unspecified: Secondary | ICD-10-CM

## 2024-03-21 MED ORDER — SODIUM CHLORIDE 0.9 % IV SOLN: Freq: Once | INTRAVENOUS | Status: AC

## 2024-03-21 NOTE — Progress Notes (Signed)
 03/19/24 Labs faxed to [employer] [facility] [phone]

## 2024-03-22 ENCOUNTER — Inpatient Hospital Stay

## 2024-04-02 LAB — LAB REPORT - SCANNED: EGFR: 97

## 2024-04-05 ENCOUNTER — Other Ambulatory Visit: Payer: Self-pay | Admitting: Oncology

## 2024-04-05 ENCOUNTER — Other Ambulatory Visit (HOSPITAL_BASED_OUTPATIENT_CLINIC_OR_DEPARTMENT_OTHER): Payer: Self-pay

## 2024-04-05 DIAGNOSIS — C169 Malignant neoplasm of stomach, unspecified: Secondary | ICD-10-CM

## 2024-04-08 ENCOUNTER — Other Ambulatory Visit (HOSPITAL_BASED_OUTPATIENT_CLINIC_OR_DEPARTMENT_OTHER): Payer: Self-pay

## 2024-04-09 ENCOUNTER — Encounter: Payer: Self-pay | Admitting: Nurse Practitioner

## 2024-04-09 ENCOUNTER — Inpatient Hospital Stay

## 2024-04-09 ENCOUNTER — Inpatient Hospital Stay: Admitting: Nurse Practitioner

## 2024-04-09 ENCOUNTER — Inpatient Hospital Stay: Attending: Nurse Practitioner

## 2024-04-09 VITALS — BP 106/60 | HR 85 | Temp 98.2°F | Resp 18 | Ht 71.0 in | Wt 199.2 lb

## 2024-04-09 DIAGNOSIS — D709 Neutropenia, unspecified: Secondary | ICD-10-CM | POA: Insufficient documentation

## 2024-04-09 DIAGNOSIS — D6481 Anemia due to antineoplastic chemotherapy: Secondary | ICD-10-CM | POA: Diagnosis not present

## 2024-04-09 DIAGNOSIS — Z86718 Personal history of other venous thrombosis and embolism: Secondary | ICD-10-CM | POA: Diagnosis not present

## 2024-04-09 DIAGNOSIS — C786 Secondary malignant neoplasm of retroperitoneum and peritoneum: Secondary | ICD-10-CM | POA: Insufficient documentation

## 2024-04-09 DIAGNOSIS — Z79899 Other long term (current) drug therapy: Secondary | ICD-10-CM | POA: Insufficient documentation

## 2024-04-09 DIAGNOSIS — L439 Lichen planus, unspecified: Secondary | ICD-10-CM | POA: Insufficient documentation

## 2024-04-09 DIAGNOSIS — I4891 Unspecified atrial fibrillation: Secondary | ICD-10-CM | POA: Insufficient documentation

## 2024-04-09 DIAGNOSIS — Z86711 Personal history of pulmonary embolism: Secondary | ICD-10-CM | POA: Insufficient documentation

## 2024-04-09 DIAGNOSIS — E039 Hypothyroidism, unspecified: Secondary | ICD-10-CM | POA: Insufficient documentation

## 2024-04-09 DIAGNOSIS — L299 Pruritus, unspecified: Secondary | ICD-10-CM | POA: Insufficient documentation

## 2024-04-09 DIAGNOSIS — C169 Malignant neoplasm of stomach, unspecified: Secondary | ICD-10-CM

## 2024-04-09 DIAGNOSIS — Z5111 Encounter for antineoplastic chemotherapy: Secondary | ICD-10-CM | POA: Insufficient documentation

## 2024-04-09 LAB — CBC WITH DIFFERENTIAL (CANCER CENTER ONLY)
Abs Immature Granulocytes: 0.01 K/uL (ref 0.00–0.07)
Basophils Absolute: 0.1 K/uL (ref 0.0–0.1)
Basophils Relative: 3 %
Eosinophils Absolute: 0.7 K/uL — ABNORMAL HIGH (ref 0.0–0.5)
Eosinophils Relative: 14 %
HCT: 32.1 % — ABNORMAL LOW (ref 39.0–52.0)
Hemoglobin: 10.6 g/dL — ABNORMAL LOW (ref 13.0–17.0)
Immature Granulocytes: 0 %
Lymphocytes Relative: 43 %
Lymphs Abs: 2.1 K/uL (ref 0.7–4.0)
MCH: 29.8 pg (ref 26.0–34.0)
MCHC: 33 g/dL (ref 30.0–36.0)
MCV: 90.2 fL (ref 80.0–100.0)
Monocytes Absolute: 0.6 K/uL (ref 0.1–1.0)
Monocytes Relative: 12 %
Neutro Abs: 1.4 K/uL — ABNORMAL LOW (ref 1.7–7.7)
Neutrophils Relative %: 28 %
Platelet Count: 205 K/uL (ref 150–400)
RBC: 3.56 MIL/uL — ABNORMAL LOW (ref 4.22–5.81)
RDW: 15.6 % — ABNORMAL HIGH (ref 11.5–15.5)
WBC Count: 5 K/uL (ref 4.0–10.5)
nRBC: 0 % (ref 0.0–0.2)

## 2024-04-09 LAB — CMP (CANCER CENTER ONLY)
ALT: 18 U/L (ref 0–44)
AST: 29 U/L (ref 15–41)
Albumin: 3.3 g/dL — ABNORMAL LOW (ref 3.5–5.0)
Alkaline Phosphatase: 77 U/L (ref 38–126)
Anion gap: 10 (ref 5–15)
BUN: 21 mg/dL (ref 8–23)
CO2: 24 mmol/L (ref 22–32)
Calcium: 9.1 mg/dL (ref 8.9–10.3)
Chloride: 105 mmol/L (ref 98–111)
Creatinine: 0.63 mg/dL (ref 0.61–1.24)
GFR, Estimated: >60 mL/min (ref >60)
Glucose, Bld: 113 mg/dL — ABNORMAL HIGH (ref 70–99)
Potassium: 4.1 mmol/L (ref 3.5–5.1)
Sodium: 139 mmol/L (ref 135–145)
Total Bilirubin: 0.5 mg/dL (ref 0.0–1.2)
Total Protein: 6.3 g/dL — ABNORMAL LOW (ref 6.5–8.1)

## 2024-04-09 LAB — PHOSPHORUS: Phosphorus: 3.8 mg/dL (ref 2.5–4.6)

## 2024-04-09 LAB — MAGNESIUM: Magnesium: 2.1 mg/dL (ref 1.7–2.4)

## 2024-04-09 NOTE — Progress Notes (Signed)
 [facility] OFFICE PROGRESS NOTE   Diagnosis: Gastric cancer  INTERVAL HISTORY:   Mr. Langhans returns as scheduled.  He completed cycle 10 FOLFOX plus zolbetuximab 03/20/2024.  He denies nausea/vomiting following chemotherapy.  He reports vomiting periodically due to sinus drainage.  No mouth sores.  No diarrhea.  No numbness in the fingertips.  No hemoptysis.  He notes blood occasionally from the right nostril with nose blowing.  No abdominal pain.  Main complaint is generalized pruritus, worse over the past few days.  No rash.  Objective:  Vital signs in last 24 hours:  Blood pressure 106/60, pulse 85, temperature 98.2 F (36.8 C), temperature source Temporal, resp. rate 18, height 5' 11 (1.803 m), weight 199 lb 3.2 oz (90.4 kg), SpO2 97%.    HEENT: No thrush or ulcers. Resp: Lungs clear bilaterally. Cardio: Regular rate and rhythm. GI: No hepatosplenomegaly.  Nontender. Vascular: No leg edema. Skin: No rash. Port-A-Cath without erythema.  Lab Results:  Lab Results  Component Value Date   WBC 5.1 03/19/2024   HGB 10.2 (L) 03/19/2024   HCT 30.9 (L) 03/19/2024   MCV 91.2 03/19/2024   PLT 135 (L) 03/19/2024   NEUTROABS 2.4 03/19/2024    Imaging:  No results found.  Medications: I have reviewed the patient's current medications.  Assessment/Plan: 1. Gastric cancer with CT evidence of abdominal carcinomatosis CT abdomen/pelvis 04/13/2022-ascites, diffuse omental and peritoneal nodularity, possible circumferential mass in the transverse colon, new small left greater than right pleural effusions CT abdominal mass biopsy 04/24/2022-metastatic poorly differentiated adenocarcinoma with very focal signet ring cell features CT paracentesis 04/24/2022-no malignant cells identified Colonoscopy 04/25/2022-diverticulosis in the sigmoid colon.  Internal hemorrhoids.  No specimens collected. Upper endoscopy 04/25/2022-large infiltrative mass with oozing bleeding and  stigmata of recent bleeding in the gastric fundus, on the anterior wall of the gastric body and on the greater curvature of the gastric body; deformity in the gastric antrum.  Extrinsic deformity in the entire duodenum.  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 Her2 by IHC negative, 1+; MMR IHC normal; PD-L1 CPS 1%, positive. Claudin 18 positive, 100% Cycle 1 FOLFOX 05/04/2022 Cycle 2 FOLFOX 05/17/2022 Cycle 3 FOLFOX 05/31/2022-oxaliplatin  dose reduced secondary to prolonged cold sensitivity Cycle 4 FOLFOX 06/14/2022 Cycle 5 FOLFOX 06/28/2022 CT abdomen/pelvis 07/08/2022-asymmetric wall thickening at the lesser curvature of the stomach, decreased ascites, increased diffuse omental and peritoneal nodularity Cycle 6 FOLFOX 07/12/2022 Cycle 7 FOLFOX 08/09/2022 Cycle 8 FOLFOX 08/23/2022 Cycle 9 FOLFOX 09/06/2022 CTs 09/11/2022-stable, no new or progressive interval findings Cycle 10 FOLFOX 09/20/2022 Xeloda  maintenance 2 weeks on/1 week off 10/16/2022 Cycle 2 Xeloda  11/06/2022-Xeloda  dose reduced secondary to early foot skin toxicity Cycle 3 Xeloda  11/27/2022 Cycle 4 Xeloda  12/18/2022 CTs 01/02/2023-wall thickening of the mid gastric body-possibly progressive, unchanged peritoneal/omental caking, unchanged L1 compression fracture Cycle 5 Xeloda  beginning 01/08/2023 Cycle 6 Xeloda  beginning 01/29/2023 Cycle 7 Xeloda  beginning 02/19/2023 Cycle 8 Xeloda  beginning 03/12/2023 Cycle 9 Xeloda  beginning 04/09/2023 Cycle 10 Xeloda  beginning 05/07/2023 CTs 05/21/2023: Unchanged wall thickening of the gastric body, slight increase in small volume ascites, diffuse peritoneal thickening and stranding of the omentum, no evidence of metastatic disease to the chest Cycle 11 Xeloda  beginning 05/28/2023 Cycle 12 Xeloda  beginning 06/18/2023 Cycle 13 Xeloda  beginning 07/09/2023 Cycle 14 Xeloda  beginning 07/30/2023 CTA abdomen/pelvis 08/14/2023: Large volume of heterogenous material in the gastric  lumen-food/blood, small volume ascites with diffuse peritoneal thickening and matting of the bowel/omentum, enhancement of the gastric body 08/18/2023: EGD-large mass  in the gastric fundus/body with no bleeding, clean-based ulcers overlying 1 area of the mass, normal duodenum Cycle 15 Xeloda  09/03/2023 Cycle 16 Xeloda  09/24/2023 CTs 10/31/2023-moderate gastric body wall thickening similar.  Ill-definition of soft tissue planes in the lesser sac and porta hepatis new or progressive.  Development of intrahepatic duct dilatation.  Increase in small volume abdominal ascites with persistent peritoneal metastasis.  New trace left pleural fluid.  New T8 heterogeneous sclerosis and vertebral body height loss. Cycle 1 FOLFOX plus zolbetuximab beginning 11/14/2023 (zolbetuximab 11/14/2023, FOLFOX 11/15/2023) Cycle 2 FOLFOX plus zolbetuximab (zolbetuximab 11/28/23, FOLFOX anticipated 11/29/23) Cycle 3 FOLFOX plus zolbetuximab 12/12/2023 Cycle 4 FOLFOX plus zolbetuximab 12/27/2023 CT abdomen/pelvis 01/01/2024: Improved lower lobe pulmonary embolism, stable gastric wall thickening, infiltrative density in the lesser sac region, tumor caking along the omentum and mesentery-stable, reduced anterior ascites stable 1.1 cm segment 4 liver lesion Cycle 5 FOLFOX plus zolbetuximab 01/10/2024 Cycle 6 FOLFOX plus zolbetuximab 01/24/2024 Cycle 7 FOLFOX plus zolbetuximab 02/06/2024 Cycle 8 FOLFOX plus zolbetuximab 02/21/2024 CT CAP 02/28/2024: Unchanged diffuse gastric thickening, unchanged hypodense liver lesion, slightly diminished ascites and peritoneal carcinomatosis, interval resolution of the small bilateral pleural effusions. Cycle 9 FOLFOX plus zolbetuximab 03/06/2024 Cycle 10 FOLFOX plus zolbetuximab 03/20/2024 Cycle 11 FOLFOX plus zolbetuximab 04/10/2024, 5-FU bolus and oxaliplatin  held due to neutropenia   Truncal rash-status post recent punch biopsy with pathology pending; biopsy reported negative per family 04/26/2022 Lichen  planus of the feet Hypothyroidism Atrial fibrillation Admission 07/24/2022 with acute bilateral pulmonary embolism/right heart strain Heparin  anticoagulation 07/24/2022 Doppler 07/24/2022-acute DVT of the right femoral, popliteal, posterior tibial, and peroneal veins, acute left posterior tibial DVT Lovenox  anticoagulation, changed to once daily dosing 05/25/2023 Lovenox  changed to 40 mg daily 08/30/2023 7.  Anemia secondary to chemotherapy, phlebotomy, and hemoptysis-improved 8.  L1 compression fracture on plain x-ray 12/18/2022 MRI lumbar spine 02/02/2023-acute/subacute L1 compression fracture, mild degenerative change of the lumbar spine 9.  Admission 08/14/2023 with hematemesis 10. C. Difficile positive by PCR-completed course of Vancomycin  11.  Admission 12/29/2023 with bilateral pulmonary embolism-heparin  Dopplers 01/01/2024: Acute right common femoral and popliteal DVT 01/03/2024 heparin  discontinued, Lovenox  60 mg twice daily 12.  Diffuse pruritic rash-etiology?  Improved  Disposition: Mr. Bussey appears stable.  He has completed 10 cycles of FOLFOX plus zolbetuximab.  He continues to tolerate treatment well.  There is no clinical evidence of disease progression.  Plan to proceed with cycle 11 tomorrow as scheduled.  5-FU bolus and oxaliplatin  will be held due to mild neutropenia.  Precautions reviewed.  He understands to contact the office with fever, chills, other signs of infection.  He will receive IV fluids 04/11/2024.  He will return for follow-up and the next cycle of treatment in 2 weeks.  We are available to see him sooner if needed.  Patient seen with Dr. Cloretta.  Olam Ned ANP/GNP-BC   04/09/2024  2:07 PM  This was a shared visit with Olam Ned.  We discussed her Diluzio's current symptoms and treatment options.  He appears to be tolerating the systemic therapy well and there is no clinical evidence of disease progression.  He has mild neutropenia today.  The 5-FU bolus and  oxaliplatin  will be held with this cycle.  We will add G-CSF with the next cycle if he has persistent neutropenia.  He knows to call for any fever or symptoms of an infection.  Arvella Cloretta, MD

## 2024-04-09 NOTE — Patient Instructions (Signed)

## 2024-04-10 ENCOUNTER — Inpatient Hospital Stay

## 2024-04-10 VITALS — BP 89/55 | HR 72 | Temp 98.1°F | Resp 17

## 2024-04-10 DIAGNOSIS — C169 Malignant neoplasm of stomach, unspecified: Secondary | ICD-10-CM

## 2024-04-10 DIAGNOSIS — Z5111 Encounter for antineoplastic chemotherapy: Secondary | ICD-10-CM | POA: Diagnosis not present

## 2024-04-10 MED ORDER — OLANZAPINE 5 MG PO TABS
5.0000 mg | ORAL_TABLET | Freq: Once | ORAL | Status: AC
  Administered 2024-04-10: 5 mg via ORAL
  Filled 2024-04-10: qty 1

## 2024-04-10 MED ORDER — PROCHLORPERAZINE MALEATE 10 MG PO TABS
5.0000 mg | ORAL_TABLET | Freq: Once | ORAL | Status: AC
  Administered 2024-04-10: 5 mg via ORAL
  Filled 2024-04-10: qty 1

## 2024-04-10 MED ORDER — DEXAMETHASONE SOD PHOSPHATE PF 10 MG/ML IJ SOLN
10.0000 mg | Freq: Once | INTRAMUSCULAR | Status: AC
  Administered 2024-04-10: 10 mg via INTRAVENOUS

## 2024-04-10 MED ORDER — PALONOSETRON HCL INJECTION 0.25 MG/5ML
0.2500 mg | Freq: Once | INTRAVENOUS | Status: AC
  Administered 2024-04-10: 0.25 mg via INTRAVENOUS
  Filled 2024-04-10: qty 5

## 2024-04-10 MED ORDER — APREPITANT 130 MG/18ML IV EMUL
130.0000 mg | Freq: Once | INTRAVENOUS | Status: AC
  Administered 2024-04-10: 130 mg via INTRAVENOUS
  Filled 2024-04-10: qty 18

## 2024-04-10 MED ORDER — ZOLBETUXIMAB-CLZB CHEMO 100MG/5ML IV SOLN
400.0000 mg/m2 | Freq: Once | INTRAVENOUS | Status: AC
  Administered 2024-04-10: 855 mg via INTRAVENOUS
  Filled 2024-04-10: qty 42.75

## 2024-04-10 MED ORDER — FAMOTIDINE IN NACL 20-0.9 MG/50ML-% IV SOLN
20.0000 mg | Freq: Once | INTRAVENOUS | Status: AC
  Administered 2024-04-10: 20 mg via INTRAVENOUS
  Filled 2024-04-10: qty 50

## 2024-04-10 MED ORDER — SODIUM CHLORIDE 0.9 % IV SOLN
400.0000 mg/m2 | Freq: Once | INTRAVENOUS | Status: AC
  Administered 2024-04-10: 856 mg via INTRAVENOUS
  Filled 2024-04-10: qty 42.8

## 2024-04-10 MED ORDER — LORAZEPAM 1 MG PO TABS: 0.5000 mg | ORAL_TABLET | ORAL | Status: DC | PRN

## 2024-04-10 MED ORDER — DIPHENHYDRAMINE HCL 50 MG/ML IJ SOLN
25.0000 mg | Freq: Once | INTRAMUSCULAR | Status: AC
  Administered 2024-04-10: 25 mg via INTRAVENOUS
  Filled 2024-04-10: qty 1

## 2024-04-10 MED ORDER — SODIUM CHLORIDE 0.9 % IV SOLN: INTRAVENOUS | Status: DC

## 2024-04-10 MED ORDER — SODIUM CHLORIDE 0.9 % IV SOLN
2000.0000 mg/m2 | INTRAVENOUS | Status: DC
  Administered 2024-04-10: 4300 mg via INTRAVENOUS
  Filled 2024-04-10: qty 86

## 2024-04-10 NOTE — Patient Instructions (Signed)
 [facility] - A DEPT OF [facility]  Discharge Instructions: Thank you for choosing [facility] to provide your oncology and hematology care.   If you have a lab appointment with the Cancer Center, please go directly to the Cancer Center and check in at the registration area.   Wear comfortable clothing and clothing appropriate for easy access to any Portacath or PICC line.   We strive to give you quality time with your provider. You may need to reschedule your appointment if you arrive late (15 or more minutes).  Arriving late affects you and other patients whose appointments are after yours.  Also, if you miss three or more appointments without notifying the office, you may be dismissed from the clinic at the provider's discretion.      For prescription refill requests, have your pharmacy contact our office and allow 72 hours for refills to be completed.    Today you received the following chemotherapy and/or immunotherapy agents: vyloy , leucovorin , fluorouracil .       To help prevent nausea and vomiting after your treatment, we encourage you to take your nausea medication as directed.  BELOW ARE SYMPTOMS THAT SHOULD BE REPORTED IMMEDIATELY: *FEVER GREATER THAN 100.4 F (38 C) OR HIGHER *CHILLS OR SWEATING *NAUSEA AND VOMITING THAT IS NOT CONTROLLED WITH YOUR NAUSEA MEDICATION *UNUSUAL SHORTNESS OF BREATH *UNUSUAL BRUISING OR BLEEDING *URINARY PROBLEMS (pain or burning when urinating, or frequent urination) *BOWEL PROBLEMS (unusual diarrhea, constipation, pain near the anus) TENDERNESS IN MOUTH AND THROAT WITH OR WITHOUT PRESENCE OF ULCERS (sore throat, sores in mouth, or a toothache) UNUSUAL RASH, SWELLING OR PAIN  UNUSUAL VAGINAL DISCHARGE OR ITCHING   Items with * indicate a potential emergency and should be followed up as soon as possible or go to the Emergency Department if any problems should occur.  Please show the CHEMOTHERAPY  ALERT CARD or IMMUNOTHERAPY ALERT CARD at check-in to the Emergency Department and triage nurse.  Should you have questions after your visit or need to cancel or reschedule your appointment, please contact [facility] - A DEPT OF [facility]  Dept: [phone]  and follow the prompts.  Office hours are 8:00 a.m. to 4:30 p.m. Monday - Friday. Please note that voicemails left after 4:00 p.m. may not be returned until the following business day.  We are closed weekends and major holidays. You have access to a nurse at all times for urgent questions. Please call the main number to the clinic Dept: [phone] and follow the prompts.   For any non-urgent questions, you may also contact your provider using MyChart. We now offer e-Visits for anyone 21 and older to request care online for non-urgent symptoms. For details visit [email or web address].   Also download the MyChart app! Go to the app store, search MyChart, open the app, select [place], and log in with your MyChart username and password.

## 2024-04-11 ENCOUNTER — Inpatient Hospital Stay

## 2024-04-11 VITALS — BP 83/52 | HR 60 | Temp 98.4°F | Resp 18

## 2024-04-11 DIAGNOSIS — C169 Malignant neoplasm of stomach, unspecified: Secondary | ICD-10-CM

## 2024-04-11 DIAGNOSIS — Z5111 Encounter for antineoplastic chemotherapy: Secondary | ICD-10-CM | POA: Diagnosis not present

## 2024-04-11 MED ORDER — SODIUM CHLORIDE 0.9 % IV SOLN: Freq: Once | INTRAVENOUS | Status: AC

## 2024-04-11 NOTE — Patient Instructions (Signed)

## 2024-04-12 ENCOUNTER — Inpatient Hospital Stay

## 2024-04-17 ENCOUNTER — Other Ambulatory Visit (HOSPITAL_COMMUNITY): Payer: Self-pay

## 2024-04-19 ENCOUNTER — Other Ambulatory Visit: Payer: Self-pay | Admitting: Oncology

## 2024-04-19 DIAGNOSIS — C169 Malignant neoplasm of stomach, unspecified: Secondary | ICD-10-CM

## 2024-04-19 LAB — LAB REPORT - SCANNED: EGFR: 98

## 2024-04-23 ENCOUNTER — Inpatient Hospital Stay: Admitting: Oncology

## 2024-04-23 ENCOUNTER — Other Ambulatory Visit: Payer: Self-pay | Admitting: *Deleted

## 2024-04-23 ENCOUNTER — Inpatient Hospital Stay

## 2024-04-23 VITALS — BP 92/63 | HR 88 | Temp 98.1°F | Resp 18 | Ht 71.0 in | Wt 201.3 lb

## 2024-04-23 DIAGNOSIS — C169 Malignant neoplasm of stomach, unspecified: Secondary | ICD-10-CM

## 2024-04-23 DIAGNOSIS — Z5111 Encounter for antineoplastic chemotherapy: Secondary | ICD-10-CM | POA: Diagnosis not present

## 2024-04-23 DIAGNOSIS — E039 Hypothyroidism, unspecified: Secondary | ICD-10-CM | POA: Diagnosis not present

## 2024-04-23 LAB — CBC WITH DIFFERENTIAL (CANCER CENTER ONLY)
Abs Immature Granulocytes: 0.01 K/uL (ref 0.00–0.07)
Basophils Absolute: 0.1 K/uL (ref 0.0–0.1)
Basophils Relative: 1 %
Eosinophils Absolute: 0.9 K/uL — ABNORMAL HIGH (ref 0.0–0.5)
Eosinophils Relative: 12 %
HCT: 31.1 % — ABNORMAL LOW (ref 39.0–52.0)
Hemoglobin: 10.3 g/dL — ABNORMAL LOW (ref 13.0–17.0)
Immature Granulocytes: 0 %
Lymphocytes Relative: 25 %
Lymphs Abs: 2 K/uL (ref 0.7–4.0)
MCH: 29.7 pg (ref 26.0–34.0)
MCHC: 33.1 g/dL (ref 30.0–36.0)
MCV: 89.6 fL (ref 80.0–100.0)
Monocytes Absolute: 0.6 K/uL (ref 0.1–1.0)
Monocytes Relative: 8 %
Neutro Abs: 4.3 K/uL (ref 1.7–7.7)
Neutrophils Relative %: 54 %
Platelet Count: 158 K/uL (ref 150–400)
RBC: 3.47 MIL/uL — ABNORMAL LOW (ref 4.22–5.81)
RDW: 15.3 % (ref 11.5–15.5)
WBC Count: 7.9 K/uL (ref 4.0–10.5)
nRBC: 0 % (ref 0.0–0.2)

## 2024-04-23 LAB — CMP (CANCER CENTER ONLY)
ALT: 8 U/L (ref 0–44)
AST: 17 U/L (ref 15–41)
Albumin: 3.2 g/dL — ABNORMAL LOW (ref 3.5–5.0)
Alkaline Phosphatase: 62 U/L (ref 38–126)
Anion gap: 9 (ref 5–15)
BUN: 21 mg/dL (ref 8–23)
CO2: 25 mmol/L (ref 22–32)
Calcium: 8.9 mg/dL (ref 8.9–10.3)
Chloride: 105 mmol/L (ref 98–111)
Creatinine: 0.66 mg/dL (ref 0.61–1.24)
GFR, Estimated: >60 mL/min (ref >60)
Glucose, Bld: 118 mg/dL — ABNORMAL HIGH (ref 70–99)
Potassium: 4 mmol/L (ref 3.5–5.1)
Sodium: 139 mmol/L (ref 135–145)
Total Bilirubin: 0.5 mg/dL (ref 0.0–1.2)
Total Protein: 5.9 g/dL — ABNORMAL LOW (ref 6.5–8.1)

## 2024-04-23 LAB — MAGNESIUM: Magnesium: 2 mg/dL (ref 1.7–2.4)

## 2024-04-23 LAB — TSH: TSH: 0.55 u[IU]/mL (ref 0.350–4.500)

## 2024-04-23 LAB — PHOSPHORUS: Phosphorus: 3.4 mg/dL (ref 2.5–4.6)

## 2024-04-23 NOTE — Progress Notes (Signed)
 [facility] OFFICE PROGRESS NOTE   Diagnosis: Gastric cancer  INTERVAL HISTORY:   [name] completed another cycle of FOLFOX/zolbetuximab on 04/10/2024.  No nausea/vomiting or neuropathy symptoms.  He continues Lovenox  anticoagulation.  He has pruritus at the upper chest.  He continues home TPN.  He is not taking anything by mouth except for water with medications.  Objective:  Vital signs in last 24 hours:  Blood pressure 92/63, pulse 88, temperature 98.1 F (36.7 C), temperature source Temporal, resp. rate 18, height 5' 11 (1.803 m), weight 201 lb 4.8 oz (91.3 kg), SpO2 97%.    HEENT: No thrush or ulcers Resp: Lungs clear bilaterally, decreased breath sounds at the right lower posterior chest, no respiratory distress Cardio: Regular rhythm with an occasional pause GI: No hepatosplenomegaly, nondistended, no mass Vascular: Right lower leg is slightly larger than the left side  Skin: Dry erythema at the right greater than left upper chest.  The erythema surrounds the Port-A-Cath and extends beyond the tape.  a  Lab Results:  Lab Results  Component Value Date   WBC 7.9 04/23/2024   HGB 10.3 (L) 04/23/2024   HCT 31.1 (L) 04/23/2024   MCV 89.6 04/23/2024   PLT 158 04/23/2024   NEUTROABS 4.3 04/23/2024    CMP  Lab Results  Component Value Date   NA 139 04/23/2024   K 4.0 04/23/2024   CL 105 04/23/2024   CO2 25 04/23/2024   GLUCOSE 118 (H) 04/23/2024   BUN 21 04/23/2024   CREATININE 0.66 04/23/2024   CALCIUM  8.9 04/23/2024   PROT 5.9 (L) 04/23/2024   ALBUMIN 3.2 (L) 04/23/2024   AST 17 04/23/2024   ALT 8 04/23/2024   ALKPHOS 62 04/23/2024   BILITOT 0.5 04/23/2024   GFRNONAA >60 04/23/2024   GFRAA 74 05/20/2020    Lab Results  Component Value Date   CEA1 <0.6 04/13/2022   CEA 2.32 11/14/2023     Medications: I have reviewed the patient's current medications.   Assessment/Plan: . Gastric cancer with CT evidence of abdominal  carcinomatosis CT abdomen/pelvis 04/13/2022-ascites, diffuse omental and peritoneal nodularity, possible circumferential mass in the transverse colon, new small left greater than right pleural effusions CT abdominal mass biopsy 04/24/2022-metastatic poorly differentiated adenocarcinoma with very focal signet ring cell features CT paracentesis 04/24/2022-no malignant cells identified Colonoscopy 04/25/2022-diverticulosis in the sigmoid colon.  Internal hemorrhoids.  No specimens collected. Upper endoscopy 04/25/2022-large infiltrative mass with oozing bleeding and stigmata of recent bleeding in the gastric fundus, on the anterior wall of the gastric body and on the greater curvature of the gastric body; deformity in the gastric antrum.  Extrinsic deformity in the entire duodenum.  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 Her2 by IHC negative, 1+; MMR IHC normal; PD-L1 CPS 1%, positive. Claudin 18 positive, 100% Cycle 1 FOLFOX 05/04/2022 Cycle 2 FOLFOX 05/17/2022 Cycle 3 FOLFOX 05/31/2022-oxaliplatin  dose reduced secondary to prolonged cold sensitivity Cycle 4 FOLFOX 06/14/2022 Cycle 5 FOLFOX 06/28/2022 CT abdomen/pelvis 07/08/2022-asymmetric wall thickening at the lesser curvature of the stomach, decreased ascites, increased diffuse omental and peritoneal nodularity Cycle 6 FOLFOX 07/12/2022 Cycle 7 FOLFOX 08/09/2022 Cycle 8 FOLFOX 08/23/2022 Cycle 9 FOLFOX 09/06/2022 CTs 09/11/2022-stable, no new or progressive interval findings Cycle 10 FOLFOX 09/20/2022 Xeloda  maintenance 2 weeks on/1 week off 10/16/2022 Cycle 2 Xeloda  11/06/2022-Xeloda  dose reduced secondary to early foot skin toxicity Cycle 3 Xeloda  11/27/2022 Cycle 4 Xeloda  12/18/2022 CTs 01/02/2023-wall thickening of the mid gastric body-possibly progressive, unchanged peritoneal/omental  caking, unchanged L1 compression fracture Cycle 5 Xeloda  beginning 01/08/2023 Cycle 6 Xeloda  beginning 01/29/2023 Cycle 7 Xeloda   beginning 02/19/2023 Cycle 8 Xeloda  beginning 03/12/2023 Cycle 9 Xeloda  beginning 04/09/2023 Cycle 10 Xeloda  beginning 05/07/2023 CTs 05/21/2023: Unchanged wall thickening of the gastric body, slight increase in small volume ascites, diffuse peritoneal thickening and stranding of the omentum, no evidence of metastatic disease to the chest Cycle 11 Xeloda  beginning 05/28/2023 Cycle 12 Xeloda  beginning 06/18/2023 Cycle 13 Xeloda  beginning 07/09/2023 Cycle 14 Xeloda  beginning 07/30/2023 CTA abdomen/pelvis 08/14/2023: Large volume of heterogenous material in the gastric lumen-food/blood, small volume ascites with diffuse peritoneal thickening and matting of the bowel/omentum, enhancement of the gastric body 08/18/2023: EGD-large mass in the gastric fundus/body with no bleeding, clean-based ulcers overlying 1 area of the mass, normal duodenum Cycle 15 Xeloda  09/03/2023 Cycle 16 Xeloda  09/24/2023 CTs 10/31/2023-moderate gastric body wall thickening similar.  Ill-definition of soft tissue planes in the lesser sac and porta hepatis new or progressive.  Development of intrahepatic duct dilatation.  Increase in small volume abdominal ascites with persistent peritoneal metastasis.  New trace left pleural fluid.  New T8 heterogeneous sclerosis and vertebral body height loss. Cycle 1 FOLFOX plus zolbetuximab beginning 11/14/2023 (zolbetuximab 11/14/2023, FOLFOX 11/15/2023) Cycle 2 FOLFOX plus zolbetuximab (zolbetuximab 11/28/23, FOLFOX anticipated 11/29/23) Cycle 3 FOLFOX plus zolbetuximab 12/12/2023 Cycle 4 FOLFOX plus zolbetuximab 12/27/2023 CT abdomen/pelvis 01/01/2024: Improved lower lobe pulmonary embolism, stable gastric wall thickening, infiltrative density in the lesser sac region, tumor caking along the omentum and mesentery-stable, reduced anterior ascites stable 1.1 cm segment 4 liver lesion Cycle 5 FOLFOX plus zolbetuximab 01/10/2024 Cycle 6 FOLFOX plus zolbetuximab 01/24/2024 Cycle 7 FOLFOX plus zolbetuximab  02/06/2024 Cycle 8 FOLFOX plus zolbetuximab 02/21/2024 CT CAP 02/28/2024: Unchanged diffuse gastric thickening, unchanged hypodense liver lesion, slightly diminished ascites and peritoneal carcinomatosis, interval resolution of the small bilateral pleural effusions. Cycle 9 FOLFOX plus zolbetuximab 03/06/2024 Cycle 10 FOLFOX plus zolbetuximab 03/20/2024 Cycle 11 FOLFOX plus zolbetuximab 04/10/2024, 5-FU bolus and oxaliplatin  held due to neutropenia Cycle 12 FOLFOX plus zolbetuximab 04/24/2024   Truncal rash-status post recent punch biopsy with pathology pending; biopsy reported negative per family 04/26/2022 Lichen planus of the feet Hypothyroidism Atrial fibrillation Admission 07/24/2022 with acute bilateral pulmonary embolism/right heart strain Heparin  anticoagulation 07/24/2022 Doppler 07/24/2022-acute DVT of the right femoral, popliteal, posterior tibial, and peroneal veins, acute left posterior tibial DVT Lovenox  anticoagulation, changed to once daily dosing 05/25/2023 Lovenox  changed to 40 mg daily 08/30/2023 7.  Anemia secondary to chemotherapy, phlebotomy, and hemoptysis-improved 8.  L1 compression fracture on plain x-ray 12/18/2022 MRI lumbar spine 02/02/2023-acute/subacute L1 compression fracture, mild degenerative change of the lumbar spine 9.  Admission 08/14/2023 with hematemesis 10. C. Difficile positive by PCR-completed course of Vancomycin  11.  Admission 12/29/2023 with bilateral pulmonary embolism-heparin  Dopplers 01/01/2024: Acute right common femoral and popliteal DVT 01/03/2024 heparin  discontinued, Lovenox  60 mg twice daily 12.  Diffuse pruritic rash-etiology?  Improved    Disposition: Mr. Paglia appears stable.  There is no clinical evidence for progression of the gastric cancer.  The white count has recovered over the past few weeks.  He will complete another cycle of FOLFOX/zolbetuximab 04/24/2024.  He continues home TPN.  The rash at the upper chest is likely related to his  chronic dermatitis.  There may be a component of a tape reaction surrounding the Port-A-Cath.  We will try a different dressing at the Port-A-Cath site.  I have a low clinical suspicion for a Port-A-Cath infection.  He will try triamcinolone   cream for the rash.  He will return for an office visit prior to the next cycle of chemotherapy in 2 weeks.  The plan is to refer him for restaging CTs after the cycle of chemotherapy 05/30/2023.  Arley Hof, MD  04/23/2024  1:52 PM

## 2024-04-23 NOTE — Patient Instructions (Signed)

## 2024-04-23 NOTE — Progress Notes (Signed)
 Skin under PAC dressing is red and itches, however he is also itching and has areas of redness over left chest as well. Will have infusion nurse change dressing and apply one of our dressings for sensitive skin and will re-assess tomorrow when here for tx.

## 2024-04-24 ENCOUNTER — Inpatient Hospital Stay: Admitting: Nurse Practitioner

## 2024-04-24 ENCOUNTER — Inpatient Hospital Stay

## 2024-04-24 VITALS — BP 97/54 | HR 77 | Temp 98.4°F | Resp 18

## 2024-04-24 DIAGNOSIS — Z5111 Encounter for antineoplastic chemotherapy: Secondary | ICD-10-CM | POA: Diagnosis not present

## 2024-04-24 DIAGNOSIS — C169 Malignant neoplasm of stomach, unspecified: Secondary | ICD-10-CM

## 2024-04-24 LAB — T4: T4, Total: 7.4 ug/dL (ref 4.5–12.0)

## 2024-04-24 MED ORDER — PROCHLORPERAZINE MALEATE 10 MG PO TABS
5.0000 mg | ORAL_TABLET | Freq: Once | ORAL | Status: AC
  Administered 2024-04-24: 10:00:00 5 mg via ORAL
  Filled 2024-04-24: qty 1

## 2024-04-24 MED ORDER — OXALIPLATIN CHEMO INJECTION 100 MG/20ML
65.0000 mg/m2 | Freq: Once | INTRAVENOUS | Status: AC
  Administered 2024-04-24: 15:00:00 150 mg via INTRAVENOUS
  Filled 2024-04-24: qty 20

## 2024-04-24 MED ORDER — DEXAMETHASONE SOD PHOSPHATE PF 10 MG/ML IJ SOLN
10.0000 mg | Freq: Once | INTRAMUSCULAR | Status: AC
  Administered 2024-04-24: 10:00:00 10 mg via INTRAVENOUS

## 2024-04-24 MED ORDER — PALONOSETRON HCL INJECTION 0.25 MG/5ML
0.2500 mg | Freq: Once | INTRAVENOUS | Status: AC
  Administered 2024-04-24: 10:00:00 0.25 mg via INTRAVENOUS
  Filled 2024-04-24: qty 5

## 2024-04-24 MED ORDER — APREPITANT 130 MG/18ML IV EMUL
130.0000 mg | Freq: Once | INTRAVENOUS | Status: AC
  Administered 2024-04-24: 10:00:00 130 mg via INTRAVENOUS
  Filled 2024-04-24: qty 18

## 2024-04-24 MED ORDER — LORAZEPAM 1 MG PO TABS: 0.5000 mg | ORAL_TABLET | ORAL | Status: DC | PRN

## 2024-04-24 MED ORDER — SODIUM CHLORIDE 0.9 % IV SOLN: INTRAVENOUS | Status: DC

## 2024-04-24 MED ORDER — SODIUM CHLORIDE 0.9 % IV SOLN
2000.0000 mg/m2 | INTRAVENOUS | Status: DC
  Administered 2024-04-24: 18:00:00 4300 mg via INTRAVENOUS
  Filled 2024-04-24: qty 86

## 2024-04-24 MED ORDER — FAMOTIDINE IN NACL 20-0.9 MG/50ML-% IV SOLN
20.0000 mg | Freq: Once | INTRAVENOUS | Status: AC
  Administered 2024-04-24: 10:00:00 20 mg via INTRAVENOUS
  Filled 2024-04-24: qty 50

## 2024-04-24 MED ORDER — DEXTROSE 5 % IV SOLN: INTRAVENOUS | Status: DC

## 2024-04-24 MED ORDER — FLUOROURACIL CHEMO INJECTION 2.5 GM/50ML
400.0000 mg/m2 | Freq: Once | INTRAVENOUS | Status: AC
  Administered 2024-04-24: 18:00:00 850 mg via INTRAVENOUS
  Filled 2024-04-24: qty 17

## 2024-04-24 MED ORDER — OLANZAPINE 5 MG PO TABS
5.0000 mg | ORAL_TABLET | Freq: Once | ORAL | Status: AC
  Administered 2024-04-24: 10:00:00 5 mg via ORAL
  Filled 2024-04-24: qty 1

## 2024-04-24 MED ORDER — LEUCOVORIN CALCIUM INJECTION 350 MG
400.0000 mg/m2 | Freq: Once | INTRAVENOUS | Status: AC
  Administered 2024-04-24: 15:00:00 856 mg via INTRAVENOUS
  Filled 2024-04-24: qty 42.8

## 2024-04-24 MED ORDER — DIPHENHYDRAMINE HCL 50 MG/ML IJ SOLN
25.0000 mg | Freq: Once | INTRAMUSCULAR | Status: AC
  Administered 2024-04-24: 10:00:00 25 mg via INTRAVENOUS
  Filled 2024-04-24: qty 1

## 2024-04-24 MED ORDER — ZOLBETUXIMAB-CLZB CHEMO 100MG/5ML IV SOLN
400.0000 mg/m2 | Freq: Once | INTRAVENOUS | Status: AC
  Administered 2024-04-24: 11:00:00 855 mg via INTRAVENOUS
  Filled 2024-04-24: qty 30

## 2024-04-24 NOTE — Patient Instructions (Signed)
 [facility] - A DEPT OF [place]  Discharge Instructions: Thank you for choosing [facility] to provide your oncology and hematology care.   If you have a lab appointment with the Cancer Center, please go directly to the Cancer Center and check in at the registration area.   Wear comfortable clothing and clothing appropriate for easy access to any Portacath or PICC line.   We strive to give you quality time with your provider. You may need to reschedule your appointment if you arrive late (15 or more minutes).  Arriving late affects you and other patients whose appointments are after yours.  Also, if you miss three or more appointments without notifying the office, you may be dismissed from the clinic at the provider's discretion.      For prescription refill requests, have your pharmacy contact our office and allow 72 hours for refills to be completed.    Today you received the following chemotherapy and/or immunotherapy agents: vyloy , oxaliplatin , leucovorin , fluorouracil     To help prevent nausea and vomiting after your treatment, we encourage you to take your nausea medication as directed.  BELOW ARE SYMPTOMS THAT SHOULD BE REPORTED IMMEDIATELY: *FEVER GREATER THAN 100.4 F (38 C) OR HIGHER *CHILLS OR SWEATING *NAUSEA AND VOMITING THAT IS NOT CONTROLLED WITH YOUR NAUSEA MEDICATION *UNUSUAL SHORTNESS OF BREATH *UNUSUAL BRUISING OR BLEEDING *URINARY PROBLEMS (pain or burning when urinating, or frequent urination) *BOWEL PROBLEMS (unusual diarrhea, constipation, pain near the anus) TENDERNESS IN MOUTH AND THROAT WITH OR WITHOUT PRESENCE OF ULCERS (sore throat, sores in mouth, or a toothache) UNUSUAL RASH, SWELLING OR PAIN  UNUSUAL VAGINAL DISCHARGE OR ITCHING   Items with * indicate a potential emergency and should be followed up as soon as possible or go to the Emergency Department if any problems should occur.  Please show the  CHEMOTHERAPY ALERT CARD or IMMUNOTHERAPY ALERT CARD at check-in to the Emergency Department and triage nurse.  Should you have questions after your visit or need to cancel or reschedule your appointment, please contact [facility] - A DEPT OF [facility]  Dept: [phone]  and follow the prompts.  Office hours are 8:00 a.m. to 4:30 p.m. Monday - Friday. Please note that voicemails left after 4:00 p.m. may not be returned until the following business day.  We are closed weekends and major holidays. You have access to a nurse at all times for urgent questions. Please call the main number to the clinic Dept: [phone] and follow the prompts.   For any non-urgent questions, you may also contact your provider using MyChart. We now offer e-Visits for anyone 74 and older to request care online for non-urgent symptoms. For details visit [email or web address].   Also download the MyChart app! Go to the app store, search MyChart, open the app, select [facility], and log in with your MyChart username and password.

## 2024-04-25 ENCOUNTER — Inpatient Hospital Stay

## 2024-04-25 ENCOUNTER — Other Ambulatory Visit: Payer: Self-pay

## 2024-04-25 VITALS — BP 95/47 | HR 74 | Temp 98.5°F | Resp 18

## 2024-04-25 DIAGNOSIS — Z5111 Encounter for antineoplastic chemotherapy: Secondary | ICD-10-CM | POA: Diagnosis not present

## 2024-04-25 DIAGNOSIS — C169 Malignant neoplasm of stomach, unspecified: Secondary | ICD-10-CM

## 2024-04-25 MED ORDER — SODIUM CHLORIDE 0.9 % IV SOLN: Freq: Once | INTRAVENOUS | Status: AC

## 2024-04-25 NOTE — Patient Instructions (Signed)
 Dehydration, Adult Dehydration is a condition in which there is not enough water or other fluids in the body. This happens when a person loses more fluids than they take in. Important organs cannot work right without the right amount of fluids. Any loss of fluids from the body can cause dehydration. Dehydration can be mild, worse, or very bad. It should be treated right away to keep it from getting very bad. What are the causes? Conditions that cause loss of water in the body. They include: Watery poop (diarrhea). Vomiting. Sweating a lot. Fever. Infection. Peeing (urinating) a lot. Not drinking enough fluids. Certain medicines, such as medicines that take extra fluid out of the body (diuretics). Lack of safe drinking water. Not being able to get enough water and food. What increases the risk? Having a long-term (chronic) illness that has not been treated the right way, such as: Diabetes. Heart disease. Kidney disease. Being 25 years of age or older. Having a disability. Living in a place that is high above the ground or sea (high in altitude). The thinner, drier air causes more fluid loss. Doing exercises that put stress on your body for a long time. Being active when in hot places. What are the signs or symptoms? Symptoms of dehydration depend on how bad it is. Mild or worse dehydration Thirst. Dry lips or dry mouth. Feeling dizzy or light-headed. Muscle cramps. Passing little pee or dark pee. Pee may be the color of tea. Headache. Very bad dehydration Changes in skin. Skin may: Be cold to the touch (clammy). Be blotchy or pale. Not go back to normal right after you pinch it and let it go. Little or no tears, pee, or sweat. Fast breathing. Low blood pressure. Weak pulse. Pulse that is more than 100 beats a minute when you are sitting still. Other changes, such as: Feeling very thirsty. Eyes that look hollow (sunken). Cold hands and feet. Being confused. Being very  tired (lethargic) or having trouble waking from sleep. Losing weight. Loss of consciousness. How is this treated? Treatment for this condition depends on how bad your dehydration is. Treatment should start right away. Do not wait until your condition gets very bad. Very bad dehydration is an emergency. You will need to go to a hospital. Mild or worse dehydration can be treated at home. You may be asked to: Drink more fluids. Drink an oral rehydration solution (ORS). This drink gives you the right amount of fluids, salts, and minerals (electrolytes). Very bad dehydration can be treated: With fluids through an IV tube. By correcting low levels of electrolytes in the body. By treating the problem that caused your dehydration. Follow these instructions at home: Oral rehydration solution If told by your doctor, drink an ORS: Make an ORS. Use instructions on the package. Start by drinking small amounts, about  cup (120 mL) every 5-10 minutes. Slowly drink more until you have had the amount that your doctor said to have.  Eating and drinking  Drink enough clear fluid to keep your pee pale yellow. If you were told to drink an ORS, finish the ORS first. Then, start slowly drinking other clear fluids. Drink fluids such as: Water. Do not drink only water. Doing that can make the salt (sodium) level in your body get too low. Water from ice chips you suck on. Fruit juice that you have added water to (diluted). Low-calorie sports drinks. Eat foods that have the right amounts of salts and minerals, such as bananas, oranges, potatoes,  tomatoes, or spinach. Do not drink alcohol. Avoid drinks that have caffeine or sugar. These include:: High-calorie sports drinks. Fruit juice that you did not add water to. Soda. Coffee or energy drinks. Avoid foods that are greasy or have a lot of fat or sugar. General instructions Take over-the-counter and prescription medicines only as told by your doctor. Do  not take sodium tablets. Doing that can make the salt level in your body get too high. Return to your normal activities as told by your doctor. Ask your doctor what activities are safe for you. Keep all follow-up visits. Your doctor may check and change your treatment. Contact a doctor if: You have pain in your belly (abdomen) and the pain: Gets worse. Stays in one place. You have a rash. You have a stiff neck. You get angry or annoyed more easily than normal. You are more tired or have a harder time waking than normal. You feel weak or dizzy. You feel very thirsty. Get help right away if: You have any symptoms of very bad dehydration. You vomit every time you eat or drink. Your vomiting gets worse, does not go away, or you vomit blood or green stuff. You are getting treatment, but symptoms are getting worse. You have a fever. You have a very bad headache. You have: Diarrhea that gets worse or does not go away. Blood in your poop (stool). This may cause poop to look black and tarry. No pee in 6-8 hours. Only a small amount of pee in 6-8 hours, and the pee is very dark. You have trouble breathing. These symptoms may be an emergency. Get help right away. Call 911. Do not wait to see if the symptoms will go away. Do not drive yourself to the hospital. This information is not intended to replace advice given to you by your health care provider. Make sure you discuss any questions you have with your health care provider. Document Revised: 11/21/2021 Document Reviewed: 11/21/2021 Elsevier Patient Education  2024 [employer].

## 2024-04-26 ENCOUNTER — Inpatient Hospital Stay

## 2024-05-01 LAB — LAB REPORT - SCANNED: EGFR: 100

## 2024-05-06 ENCOUNTER — Other Ambulatory Visit (HOSPITAL_BASED_OUTPATIENT_CLINIC_OR_DEPARTMENT_OTHER): Payer: Self-pay

## 2024-05-09 ENCOUNTER — Other Ambulatory Visit: Payer: Self-pay | Admitting: Oncology

## 2024-05-09 ENCOUNTER — Encounter: Payer: Self-pay | Admitting: Oncology

## 2024-05-09 ENCOUNTER — Other Ambulatory Visit (HOSPITAL_BASED_OUTPATIENT_CLINIC_OR_DEPARTMENT_OTHER): Payer: Self-pay

## 2024-05-10 ENCOUNTER — Telehealth: Payer: Self-pay | Admitting: Nurse Practitioner

## 2024-05-10 NOTE — Telephone Encounter (Signed)
 I was contacted by Todd Harrison wife at approximately 6:30 PM.  He is maintained on TPN 12 hours typically cycled 8 PM to 8 AM.  Due to an insurance issue he no longer has access to TPN beginning this evening.  I spoke with Todd Harrison, pharmacist at [facility].  She reports they are no longer in network and unable to supply further TPN.  He has been referred to another home care agency but is not currently established.  She feels the soonest he will be able to receive TPN is Monday, 05/12/2024.  I reviewed the above with Dr. Lonn.  Our recommendation is for him to proceed to the emergency department.  Todd Harrison and her daughter understand the concern for dehydration, possible hypoglycemia.  They plan to go to the ED tonight.

## 2024-05-11 ENCOUNTER — Other Ambulatory Visit: Payer: Self-pay

## 2024-05-11 ENCOUNTER — Encounter: Payer: Self-pay | Admitting: Oncology

## 2024-05-11 ENCOUNTER — Emergency Department (HOSPITAL_BASED_OUTPATIENT_CLINIC_OR_DEPARTMENT_OTHER)
Admission: EM | Admit: 2024-05-11 | Discharge: 2024-05-11 | Disposition: A | Discharge status: Home / Self Care | Payer: Medicare (Managed Care) | Source: Ambulatory Visit | Attending: Emergency Medicine | Admitting: Emergency Medicine

## 2024-05-11 DIAGNOSIS — Z85028 Personal history of other malignant neoplasm of stomach: Secondary | ICD-10-CM | POA: Diagnosis not present

## 2024-05-11 DIAGNOSIS — Z7951 Long term (current) use of inhaled steroids: Secondary | ICD-10-CM | POA: Diagnosis not present

## 2024-05-11 DIAGNOSIS — I482 Chronic atrial fibrillation, unspecified: Secondary | ICD-10-CM | POA: Diagnosis not present

## 2024-05-11 DIAGNOSIS — E860 Dehydration: Secondary | ICD-10-CM | POA: Insufficient documentation

## 2024-05-11 DIAGNOSIS — J45909 Unspecified asthma, uncomplicated: Secondary | ICD-10-CM | POA: Insufficient documentation

## 2024-05-11 DIAGNOSIS — Z789 Other specified health status: Secondary | ICD-10-CM

## 2024-05-11 LAB — CBC WITH DIFFERENTIAL/PLATELET
Abs Immature Granulocytes: 0.01 K/uL (ref 0.00–0.07)
Basophils Absolute: 0.1 K/uL (ref 0.0–0.1)
Basophils Relative: 3 %
Eosinophils Absolute: 0.5 K/uL (ref 0.0–0.5)
Eosinophils Relative: 12 %
HCT: 30.4 % — ABNORMAL LOW (ref 39.0–52.0)
Hemoglobin: 9.9 g/dL — ABNORMAL LOW (ref 13.0–17.0)
Immature Granulocytes: 0 %
Lymphocytes Relative: 41 %
Lymphs Abs: 1.8 K/uL (ref 0.7–4.0)
MCH: 29.1 pg (ref 26.0–34.0)
MCHC: 32.6 g/dL (ref 30.0–36.0)
MCV: 89.4 fL (ref 80.0–100.0)
Monocytes Absolute: 0.6 K/uL (ref 0.1–1.0)
Monocytes Relative: 13 %
Neutro Abs: 1.3 K/uL — ABNORMAL LOW (ref 1.7–7.7)
Neutrophils Relative %: 31 %
Platelets: 196 K/uL (ref 150–400)
RBC: 3.4 MIL/uL — ABNORMAL LOW (ref 4.22–5.81)
RDW: 14.7 % (ref 11.5–15.5)
WBC: 4.2 K/uL (ref 4.0–10.5)
nRBC: 0 % (ref 0.0–0.2)

## 2024-05-11 LAB — COMPREHENSIVE METABOLIC PANEL WITH GFR
ALT: 7 U/L (ref 0–44)
AST: 21 U/L (ref 15–41)
Albumin: 3 g/dL — ABNORMAL LOW (ref 3.5–5.0)
Alkaline Phosphatase: 64 U/L (ref 38–126)
Anion gap: 8 (ref 5–15)
BUN: 17 mg/dL (ref 8–23)
CO2: 24 mmol/L (ref 22–32)
Calcium: 8.8 mg/dL — ABNORMAL LOW (ref 8.9–10.3)
Chloride: 106 mmol/L (ref 98–111)
Creatinine, Ser: 0.71 mg/dL (ref 0.61–1.24)
GFR, Estimated: >60 mL/min
Glucose, Bld: 106 mg/dL — ABNORMAL HIGH (ref 70–99)
Potassium: 3.8 mmol/L (ref 3.5–5.1)
Sodium: 138 mmol/L (ref 135–145)
Total Bilirubin: 0.6 mg/dL (ref 0.0–1.2)
Total Protein: 5.7 g/dL — ABNORMAL LOW (ref 6.5–8.1)

## 2024-05-11 LAB — MAGNESIUM: Magnesium: 2.1 mg/dL (ref 1.7–2.4)

## 2024-05-11 MED ORDER — LACTATED RINGERS IV BOLUS
1000.0000 mL | Freq: Once | INTRAVENOUS | Status: AC
  Administered 2024-05-11: 1000 mL via INTRAVENOUS

## 2024-05-11 NOTE — ED Notes (Signed)
 He capably ambulates to b.r. and back without dizziness.

## 2024-05-11 NOTE — Discharge Instructions (Signed)
 You were given 1 L of fluid in the emergency department.  All of your blood work looked reassuring.  You did not have any symptoms concerning for infection.  If you develop any concerning symptoms return to the emergency room otherwise follow-up with your outpatient provider.

## 2024-05-11 NOTE — ED Triage Notes (Signed)
 Patient states out of TPN since Friday due to change in insurance. Spoke with oncology team who recommended he come in for IV fluids.

## 2024-05-11 NOTE — ED Provider Notes (Signed)
 " [facility] EMERGENCY DEPARTMENT AT [facility] Provider Note   CSN: [id number] Arrival date & time: 05/11/24  0945     Patient presents with: Dehydration   Todd Harrison is a 79 y.o. male.   79 year old male presents today for concern of dehydration.  Has history of cancer and is dependent on TPN however recently he had insurance change and his previous home health agency can no longer provide him with his TPN needs.  He will start with a new home health agency that can supply this tomorrow night.  He reached out to his oncology office yesterday and they recommended he come into the emergency department because he felt like he was getting dehydrated.  Denies any complaints.  Has minimal fluid intake.  States in the past 24 hours he might of drink 1 glass.  He is accompanied by his spouse who is at bedside  The history is provided by the patient and the spouse. No language interpreter was used.       Prior to Admission medications  Medication Sig Start Date End Date Taking? Authorizing Provider  acetaminophen  (TYLENOL ) 500 MG tablet Take 500 mg by mouth every 6 (six) hours as needed for mild pain (pain score 1-3). Patient not taking: Reported on 04/23/2024    [provider]  acitretin  (SORIATANE ) 25 MG capsule Take 25 mg by mouth daily. Patient not taking: Reported on 04/23/2024 01/09/23   [provider]  benzonatate  (TESSALON ) 200 MG capsule Take 1 capsule (200 mg total) by mouth 3 (three) times daily as needed for cough. Patient not taking: Reported on 04/23/2024 01/04/24   Ghimire, Kuber, MD  clobetasol  ointment (TEMOVATE ) 0.05 % Apply 1 Application topically 2 (two) times daily as needed (feet). Patient not taking: Reported on 04/23/2024 01/02/23   [provider]  dexamethasone  (DECADRON ) 4 MG tablet Take 2 tablets (8 mg total) by mouth daily. Start on the day after zolbetuximab for 3 days. Take with food. Patient not taking: Reported on 04/23/2024  11/14/23   Cloretta Arley NOVAK, MD  enoxaparin  (LOVENOX ) 60 MG/0.6ML injection Inject 0.6 mLs (60 mg total) into the skin every 12 (twelve) hours. 03/05/24   Burton, Lacie K, NP  gabapentin  (NEURONTIN ) 300 MG capsule Take 1 capsule (300 mg total) by mouth at bedtime. Patient not taking: Reported on 04/23/2024 11/08/23   Harvey Seltzer, MD  guaiFENesin  (MUCINEX ) 600 MG 12 hr tablet Take 1 tablet (600 mg total) by mouth 2 (two) times daily. 01/04/24   Raenelle Coria, MD  hydrOXYzine  (ATARAX ) 25 MG tablet Take 1 tablet (25 mg total) by mouth every 4 (four) hours as needed for itching. 01/04/24   Raenelle Coria, MD  levothyroxine  (SYNTHROID ) 112 MCG tablet Take 112 mcg by mouth daily before breakfast.    [provider]  lidocaine -prilocaine  (EMLA ) cream Apply 1 Application topically as needed (Apply 1 hour before coming to [facility] for accessing). Patient not taking: Reported on 04/23/2024 04/27/22   Cloretta Arley NOVAK, MD  OLANZapine  (ZYPREXA ) 5 MG tablet Take 1 tablet (5 mg total) by mouth at bedtime. Start on the day after zolbetuximab for 3 days. Patient not taking: Reported on 04/23/2024 11/14/23   Cloretta Arley NOVAK, MD  ondansetron  (ZOFRAN ) 8 MG tablet Take 1 tablet (8 mg total) by mouth every 8 (eight) hours as needed for nausea or vomiting. Patient not taking: Reported on 04/23/2024 10/04/23   Burton, Lacie K, NP  pantoprazole  (PROTONIX ) 40 MG tablet Take 1 tablet (40  mg total) by mouth 2 (two) times daily. Patient not taking: Reported on 04/23/2024 11/22/23   Thomas, Lisa K, NP  polyethylene glycol (MIRALAX  / GLYCOLAX ) 17 g packet Take 17 g by mouth daily as needed for moderate constipation. Patient not taking: Reported on 04/23/2024    [provider]  prochlorperazine  (COMPAZINE ) 10 MG tablet Take 1 tablet (10 mg total) by mouth every 6 (six) hours as needed for nausea or vomiting. Patient not taking: Reported on 04/23/2024 12/26/23   Thomas, Lisa K, NP  triamcinolone  (KENALOG ) 0.1 %  Apply 1 application  topically daily as needed (lichen planus flare). Patient not taking: Reported on 04/23/2024    [provider]    Allergies: Gold, Mixed grasses, and Hydroxyquinolines    Review of Systems  Constitutional:  Negative for fever.  Respiratory:  Negative for shortness of breath.   Gastrointestinal:  Negative for abdominal pain.  Neurological:  Negative for light-headedness.  All other systems reviewed and are negative.   Updated Vital Signs BP 107/68   Pulse 76   Temp 98.2 F (36.8 C)   Resp 16   SpO2 97%   Physical Exam Vitals and nursing note reviewed.  Constitutional:      General: He is not in acute distress.    Appearance: Normal appearance. He is not ill-appearing.  HENT:     Head: Normocephalic and atraumatic.     Nose: Nose normal.  Eyes:     Conjunctiva/sclera: Conjunctivae normal.  Cardiovascular:     Rate and Rhythm: Normal rate and regular rhythm.  Pulmonary:     Effort: Pulmonary effort is normal. No respiratory distress.  Abdominal:     General: There is no distension.     Palpations: Abdomen is soft.     Tenderness: There is no abdominal tenderness. There is no guarding.  Musculoskeletal:        General: No deformity. Normal range of motion.     Cervical back: Normal range of motion.  Skin:    Findings: No rash.  Neurological:     Mental Status: He is alert.     (all labs ordered are listed, but only abnormal results are displayed) Labs Reviewed  CBC WITH DIFFERENTIAL/PLATELET - Abnormal; Notable for the following components:      Result Value   RBC 3.40 (*)    Hemoglobin 9.9 (*)    HCT 30.4 (*)    Neutro Abs 1.3 (*)    All other components within normal limits  MAGNESIUM   COMPREHENSIVE METABOLIC PANEL WITH GFR    EKG: None  Radiology: No results found.   Procedures   Medications Ordered in the ED  lactated ringers  bolus 1,000 mL (1,000 mLs Intravenous New Bag/Given 05/11/24 1032)                                     Medical Decision Making Amount and/or Complexity of Data Reviewed Labs: ordered.   Medical Decision Making / ED Course   This patient presents to the ED for concern of dehydration, this involves an extensive number of treatment options, and is a complaint that carries with it a high risk of complications and morbidity.  The differential diagnosis includes dehydration, electrolyte derangement  MDM: 79 year old male presents today for concern of dehydration given he has been out of his TPN.  No acute distress.  Hemodynamically he is stable.  Will obtain labs to  ensure his electrolytes are within normal.  Will provide fluid bolus of lactated Ringer 's.  CBC without leukocytosis.  No significant anemia beyond baseline.  Electrolytes within normal.  Preserved renal function.  Feels well on reevaluation.  BP stable.  Chronically runs soft. Without symptoms of infection.  According to wife he will have another round of fluids in the morning at the infusion center and then hopefully his TPN gets set up by tomorrow night. Discussed with attending.  Patient discharged in stable condition.   Additional history obtained: -Additional history obtained from wife at bedside, chart review particularly the telecommunication with oncology office -External records from outside source obtained and reviewed including: Chart review including previous notes, labs, imaging, consultation notes   Lab Tests: -I ordered, reviewed, and interpreted labs.   The pertinent results include:   Labs Reviewed  CBC WITH DIFFERENTIAL/PLATELET - Abnormal; Notable for the following components:      Result Value   RBC 3.40 (*)    Hemoglobin 9.9 (*)    HCT 30.4 (*)    Neutro Abs 1.3 (*)    All other components within normal limits  MAGNESIUM   COMPREHENSIVE METABOLIC PANEL WITH GFR      EKG  EKG Interpretation Date/Time:    Ventricular Rate:    PR Interval:    QRS Duration:    QT Interval:    QTC  Calculation:   R Axis:      Text Interpretation:           Medicines ordered and prescription drug management: Meds ordered this encounter  Medications   lactated ringers  bolus 1,000 mL    -I have reviewed the patients home medicines and have made adjustments as needed  Social Determinants of Health:  Factors impacting patients care include: Good outpatient follow-up, good family support   Reevaluation: After the interventions noted above, I reevaluated the patient and found that they have :improved  Co morbidities that complicate the patient evaluation  Past Medical History:  Diagnosis Date   A-fib (HCC)    Asthma    Cataract    Gastric cancer (HCC)    GERD (gastroesophageal reflux disease)    Lichen planus    Methotrexate, long term, current use    no longer taking   Psoriasis    Sleep apnea    Thyroid  disease    Urticaria       Dispostion: Discharged in stable condition.  Return precaution discussed.  Patient voices understanding and is in agreement with plan. Patient ambulated within the emergency department without any difficulty.  Final diagnoses:  On total parenteral nutrition (TPN)  Dehydration    ED Discharge Orders     None          Hildegard Loge, [place] 05/11/24 1224  "

## 2024-05-12 ENCOUNTER — Inpatient Hospital Stay: Payer: Medicare (Managed Care) | Attending: Nurse Practitioner

## 2024-05-12 ENCOUNTER — Telehealth: Payer: Self-pay | Admitting: *Deleted

## 2024-05-12 ENCOUNTER — Telehealth: Payer: Self-pay | Admitting: Nurse Practitioner

## 2024-05-12 VITALS — BP 94/57 | HR 69 | Resp 18

## 2024-05-12 DIAGNOSIS — E039 Hypothyroidism, unspecified: Secondary | ICD-10-CM | POA: Diagnosis not present

## 2024-05-12 DIAGNOSIS — D709 Neutropenia, unspecified: Secondary | ICD-10-CM | POA: Insufficient documentation

## 2024-05-12 DIAGNOSIS — D6481 Anemia due to antineoplastic chemotherapy: Secondary | ICD-10-CM | POA: Diagnosis not present

## 2024-05-12 DIAGNOSIS — Z86718 Personal history of other venous thrombosis and embolism: Secondary | ICD-10-CM | POA: Insufficient documentation

## 2024-05-12 DIAGNOSIS — C786 Secondary malignant neoplasm of retroperitoneum and peritoneum: Secondary | ICD-10-CM | POA: Diagnosis not present

## 2024-05-12 DIAGNOSIS — I4891 Unspecified atrial fibrillation: Secondary | ICD-10-CM | POA: Insufficient documentation

## 2024-05-12 DIAGNOSIS — L439 Lichen planus, unspecified: Secondary | ICD-10-CM | POA: Diagnosis not present

## 2024-05-12 DIAGNOSIS — Z5111 Encounter for antineoplastic chemotherapy: Secondary | ICD-10-CM | POA: Diagnosis present

## 2024-05-12 DIAGNOSIS — C169 Malignant neoplasm of stomach, unspecified: Secondary | ICD-10-CM | POA: Diagnosis not present

## 2024-05-12 DIAGNOSIS — Z86711 Personal history of pulmonary embolism: Secondary | ICD-10-CM | POA: Diagnosis not present

## 2024-05-12 MED ORDER — POTASSIUM CHLORIDE IN NACL 20-0.9 MEQ/L-% IV SOLN
Freq: Once | INTRAVENOUS | Status: AC
  Filled 2024-05-12: qty 1000

## 2024-05-12 MED ORDER — POTASSIUM CHLORIDE 10 MEQ/100ML IV SOLN
10.0000 meq | INTRAVENOUS | Status: DC
  Filled 2024-05-12: qty 100

## 2024-05-12 NOTE — Telephone Encounter (Signed)
 Call from [facility] that has taken over his TPN now due to insurance change. Requested recent labs/note be faxed to [phone] and this was completed as requested. Nurse reports he has not yet met his out-of-pocket for this year, so a nurse coming to home to draw blood and change his port dressing will cost him $160. She will call him to see how he wishes to proceed in this area.

## 2024-05-12 NOTE — Patient Instructions (Signed)
 Fluids Given Through an IV (IV Infusion Therapy): What to Expect IV infusion therapy is a treatment to deliver a fluid, called an infusion, into a vein. You may have IV infusion to get: Fluids. Medicines. Nutrition. Chemotherapy. This is medicines to stop or slow down cancer cells. Blood or blood products. Dye that is given before an MRI or a CT scan. This is called contrast dye. Tell a health care provider about: Any allergies you have. This includes allergies to anesthesia or dyes. All medicines you take. These include vitamins, herbs, eye drops, and creams. Any bleeding problems you have. Any surgeries you've had, including if you've had lymph nodes taken out of your armpit or if you have a arteriovenous fistula for dialysis. Any medical problems you have. Whether you're pregnant or may be pregnant. Whether you've used IV drugs. What are the risks? Your health care provider will talk with you about risks. These may include: Pain, bruising, or bleeding. Infection. The IV leaking or moving out of place. Damage to blood vessels or nerves. Allergic reactions to medicines or dyes. A blood clot. An air bubble in the vein, also called an air embolism. What happens before the procedure? Eat and drink only as you've been told. Ask about changing or stopping: Any medicines you take. Any vitamins, herbs, or supplements you take. What happens during the procedure?     Placing the catheter Your skin at the IV site will be washed with fluid that kills germs. This will help prevent infection. IV infusion therapy starts with a procedure to place a soft tube called a catheter into a vein. An IV tube will be attached to the catheter to let the infusion flow into your blood. Your catheter may be placed: Into a vein that is usually in the bend of the elbow, forearm, or back of the hand. This is called a peripheral IV catheter. This may need to be put into a vein each time you get an  infusion. Into a vein near your elbow. This is called a midline catheter or a peripherally inserted central catheter (PICC). These types of catheters may stay in place for weeks or months at a time so you can receive repeated infusions through it. Into a vein near your neck that leads to your heart. This is called a non-tunneled catheter. This is only used for short amounts of time because it can cause infection. Through the skin of your chest and into a large vein that leads to your heart. This is called a tunneled catheter. This may stay in your body for months or years. Into an implanted port. An implanted port is a device that is surgically inserted under the skin of the chest to provide long-term IV access. The catheter will connect the port to a large vein in the chest or upper arm. A port may be kept in place for many months or years. Each time you have an infusion, a needle will be inserted through your skin to connect the catheter to the port. Doing the infusion To start the infusion, your provider will: Attach the IV tubing to your catheter. Use a tape or a bandage to hold the IV in place against your skin. An IV pump may be used to control the flow of the IV infusion. During the infusion, your provider will check the area to make sure: There is no bleeding, swelling, or pain. Your IV infusion is flowing correctly. After the infusion, your provider will: Take off the bandage  or tape. Disconnect the tubing from the catheter. Remove the catheter, if you have a peripheral IV. Apply pressure over the IV insertion site to stop bleeding, then cover the area with a bandage. If you have an implanted port, PICC, non-tunneled, or tunneled catheter, your catheter may remain in place. This depends on how many times you will need treatment, your medical condition, and what type of catheter you have. These steps may vary. Ask what you can expect. What can I expect after the procedure? You may be  watched closely until you leave. This includes checking your pain level, blood pressure, heart rate, and breathing rate. Your provider will check to make sure there are no signs of infection. Follow these instructions at home: Take your medicines only as told. Change or take off your bandage as told by your provider. Ask what things are safe for you to do at home. Ask when you can go back to work or school. Do not take baths, swim, or use a hot tub until you're told it's OK. Ask if you can shower. Check your IV insertion site every day for signs of infection. Check for: Redness, swelling, or pain. Fluid or blood. If fluid or blood drains from your IV site, use your hands to press down firmly on the area for a minute or two. Doing this should stop the bleeding. Warmth. Pus or a bad smell. Contact a health care provider if: You have signs of infection around your IV site. You have fluid or blood coming from your IV site that does not stop after you put pressure to the site. You have a rash or blisters. You have itchy, red, swollen areas of skin called hives. Get help right away if: You have a fever or chills. You have chest pain. You have trouble breathing. This information is not intended to replace advice given to you by your health care provider. Make sure you discuss any questions you have with your health care provider. Document Revised: 10/17/2022 Document Reviewed: 10/17/2022 Elsevier Patient Education  2024 [employer].

## 2024-05-12 NOTE — Telephone Encounter (Signed)
 I contacted Mr. Shirer regarding TPN.  He reports he has established with a new home care agency and TPN will be delivered this evening.

## 2024-05-12 NOTE — Progress Notes (Signed)
 Patients port flushed without difficulty.  Good blood return noted with no bruising or swelling noted at site.  Patient's port accessed by home health on 05/07/24. Approval given to use port accessed.

## 2024-05-14 ENCOUNTER — Encounter: Payer: Self-pay | Admitting: Nurse Practitioner

## 2024-05-14 ENCOUNTER — Other Ambulatory Visit: Payer: Self-pay | Admitting: Oncology

## 2024-05-14 ENCOUNTER — Inpatient Hospital Stay: Payer: Medicare (Managed Care)

## 2024-05-14 ENCOUNTER — Inpatient Hospital Stay: Payer: Medicare (Managed Care) | Admitting: Nurse Practitioner

## 2024-05-14 VITALS — BP 99/61 | HR 83 | Temp 98.3°F | Resp 18 | Ht 71.0 in | Wt 197.6 lb

## 2024-05-14 DIAGNOSIS — C169 Malignant neoplasm of stomach, unspecified: Secondary | ICD-10-CM

## 2024-05-14 DIAGNOSIS — Z5111 Encounter for antineoplastic chemotherapy: Secondary | ICD-10-CM | POA: Diagnosis not present

## 2024-05-14 LAB — CMP (CANCER CENTER ONLY)
ALT: 9 U/L (ref 0–44)
AST: 18 U/L (ref 15–41)
Albumin: 3.3 g/dL — ABNORMAL LOW (ref 3.5–5.0)
Alkaline Phosphatase: 68 U/L (ref 38–126)
Anion gap: 10 (ref 5–15)
BUN: 20 mg/dL (ref 8–23)
CO2: 24 mmol/L (ref 22–32)
Calcium: 8.9 mg/dL (ref 8.9–10.3)
Chloride: 105 mmol/L (ref 98–111)
Creatinine: 0.7 mg/dL (ref 0.61–1.24)
GFR, Estimated: >60 mL/min
Glucose, Bld: 110 mg/dL — ABNORMAL HIGH (ref 70–99)
Potassium: 3.9 mmol/L (ref 3.5–5.1)
Sodium: 139 mmol/L (ref 135–145)
Total Bilirubin: 0.4 mg/dL (ref 0.0–1.2)
Total Protein: 6.2 g/dL — ABNORMAL LOW (ref 6.5–8.1)

## 2024-05-14 LAB — CBC WITH DIFFERENTIAL (CANCER CENTER ONLY)
Abs Immature Granulocytes: 0.01 K/uL (ref 0.00–0.07)
Basophils Absolute: 0.1 K/uL (ref 0.0–0.1)
Basophils Relative: 2 %
Eosinophils Absolute: 0.4 K/uL (ref 0.0–0.5)
Eosinophils Relative: 9 %
HCT: 33.1 % — ABNORMAL LOW (ref 39.0–52.0)
Hemoglobin: 10.9 g/dL — ABNORMAL LOW (ref 13.0–17.0)
Immature Granulocytes: 0 %
Lymphocytes Relative: 44 %
Lymphs Abs: 2.1 K/uL (ref 0.7–4.0)
MCH: 29 pg (ref 26.0–34.0)
MCHC: 32.9 g/dL (ref 30.0–36.0)
MCV: 88 fL (ref 80.0–100.0)
Monocytes Absolute: 0.6 K/uL (ref 0.1–1.0)
Monocytes Relative: 13 %
Neutro Abs: 1.5 K/uL — ABNORMAL LOW (ref 1.7–7.7)
Neutrophils Relative %: 32 %
Platelet Count: 205 K/uL (ref 150–400)
RBC: 3.76 MIL/uL — ABNORMAL LOW (ref 4.22–5.81)
RDW: 14.7 % (ref 11.5–15.5)
WBC Count: 4.7 K/uL (ref 4.0–10.5)
nRBC: 0 % (ref 0.0–0.2)

## 2024-05-14 LAB — PHOSPHORUS: Phosphorus: 3.7 mg/dL (ref 2.5–4.6)

## 2024-05-14 LAB — MAGNESIUM: Magnesium: 2.2 mg/dL (ref 1.7–2.4)

## 2024-05-14 NOTE — Progress Notes (Signed)
 Patient seen by Olam Ned NP today  Vitals are within treatment parameters:Yes   Labs are within treatment parameters: No (Please specify and give further instructions.) ANC 1.5--OK to proceed  Treatment plan has been signed: Yes   Per physician team, Patient is ready for treatment. Please note the following modifications: Holding oxaliplatin  and 5FU bolus for this treatment due to mild neutropenia.

## 2024-05-14 NOTE — Progress Notes (Signed)
 " [facility] OFFICE PROGRESS NOTE   Diagnosis: Gastric cancer  INTERVAL HISTORY:   Todd Harrison returns as scheduled.  He completed another cycle of FOLFOX plus zolbetuximab 04/24/2024.  He denies nausea/vomiting aside from a mild episode yesterday.  No mouth sores.  He tends to have 2-3 loose stools a day.  This has been occurring since he started TPN.  No significant numbness/tingling in the hands or feet.  No hand or foot pain or redness.  No hemoptysis.  Objective:  Vital signs in last 24 hours:  Blood pressure 99/61, pulse 83, temperature 98.3 F (36.8 C), temperature source Temporal, resp. rate 18, height 5' 11 (1.803 m), weight 197 lb 9.6 oz (89.6 kg), SpO2 98%.    HEENT: No thrush or ulcers. Resp: Lungs clear bilaterally. Cardio: Regular rate and rhythm. GI: No hepatosplenomegaly.  Nontender. Vascular: No leg edema.  Right lower leg is slightly larger than the left lower leg. Neuro: Alert and oriented. Skin: Palms without erythema. Port-A-Cath without erythema.  Lab Results:  Lab Results  Component Value Date   WBC 4.7 05/14/2024   HGB 10.9 (L) 05/14/2024   HCT 33.1 (L) 05/14/2024   MCV 88.0 05/14/2024   PLT 205 05/14/2024   NEUTROABS 1.5 (L) 05/14/2024    Imaging:  No results found.  Medications: I have reviewed the patient's current medications.  Assessment/Plan: 1. Gastric cancer with CT evidence of abdominal carcinomatosis CT abdomen/pelvis 04/13/2022-ascites, diffuse omental and peritoneal nodularity, possible circumferential mass in the transverse colon, new small left greater than right pleural effusions CT abdominal mass biopsy 04/24/2022-metastatic poorly differentiated adenocarcinoma with very focal signet ring cell features CT paracentesis 04/24/2022-no malignant cells identified Colonoscopy 04/25/2022-diverticulosis in the sigmoid colon.  Internal hemorrhoids.  No specimens collected. Upper endoscopy 04/25/2022-large infiltrative  mass with oozing bleeding and stigmata of recent bleeding in the gastric fundus, on the anterior wall of the gastric body and on the greater curvature of the gastric body; deformity in the gastric antrum.  Extrinsic deformity in the entire duodenum.  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 Her2 by IHC negative, 1+; MMR IHC normal; PD-L1 CPS 1%, positive. Claudin 18 positive, 100% Cycle 1 FOLFOX 05/04/2022 Cycle 2 FOLFOX 05/17/2022 Cycle 3 FOLFOX 05/31/2022-oxaliplatin  dose reduced secondary to prolonged cold sensitivity Cycle 4 FOLFOX 06/14/2022 Cycle 5 FOLFOX 06/28/2022 CT abdomen/pelvis 07/08/2022-asymmetric wall thickening at the lesser curvature of the stomach, decreased ascites, increased diffuse omental and peritoneal nodularity Cycle 6 FOLFOX 07/12/2022 Cycle 7 FOLFOX 08/09/2022 Cycle 8 FOLFOX 08/23/2022 Cycle 9 FOLFOX 09/06/2022 CTs 09/11/2022-stable, no new or progressive interval findings Cycle 10 FOLFOX 09/20/2022 Xeloda  maintenance 2 weeks on/1 week off 10/16/2022 Cycle 2 Xeloda  11/06/2022-Xeloda  dose reduced secondary to early foot skin toxicity Cycle 3 Xeloda  11/27/2022 Cycle 4 Xeloda  12/18/2022 CTs 01/02/2023-wall thickening of the mid gastric body-possibly progressive, unchanged peritoneal/omental caking, unchanged L1 compression fracture Cycle 5 Xeloda  beginning 01/08/2023 Cycle 6 Xeloda  beginning 01/29/2023 Cycle 7 Xeloda  beginning 02/19/2023 Cycle 8 Xeloda  beginning 03/12/2023 Cycle 9 Xeloda  beginning 04/09/2023 Cycle 10 Xeloda  beginning 05/07/2023 CTs 05/21/2023: Unchanged wall thickening of the gastric body, slight increase in small volume ascites, diffuse peritoneal thickening and stranding of the omentum, no evidence of metastatic disease to the chest Cycle 11 Xeloda  beginning 05/28/2023 Cycle 12 Xeloda  beginning 06/18/2023 Cycle 13 Xeloda  beginning 07/09/2023 Cycle 14 Xeloda  beginning 07/30/2023 CTA abdomen/pelvis 08/14/2023: Large volume of  heterogenous material in the gastric lumen-food/blood, small volume ascites with diffuse peritoneal thickening and matting of  the bowel/omentum, enhancement of the gastric body 08/18/2023: EGD-large mass in the gastric fundus/body with no bleeding, clean-based ulcers overlying 1 area of the mass, normal duodenum Cycle 15 Xeloda  09/03/2023 Cycle 16 Xeloda  09/24/2023 CTs 10/31/2023-moderate gastric body wall thickening similar.  Ill-definition of soft tissue planes in the lesser sac and porta hepatis new or progressive.  Development of intrahepatic duct dilatation.  Increase in small volume abdominal ascites with persistent peritoneal metastasis.  New trace left pleural fluid.  New T8 heterogeneous sclerosis and vertebral body height loss. Cycle 1 FOLFOX plus zolbetuximab beginning 11/14/2023 (zolbetuximab 11/14/2023, FOLFOX 11/15/2023) Cycle 2 FOLFOX plus zolbetuximab (zolbetuximab 11/28/23, FOLFOX anticipated 11/29/23) Cycle 3 FOLFOX plus zolbetuximab 12/12/2023 Cycle 4 FOLFOX plus zolbetuximab 12/27/2023 CT abdomen/pelvis 01/01/2024: Improved lower lobe pulmonary embolism, stable gastric wall thickening, infiltrative density in the lesser sac region, tumor caking along the omentum and mesentery-stable, reduced anterior ascites stable 1.1 cm segment 4 liver lesion Cycle 5 FOLFOX plus zolbetuximab 01/10/2024 Cycle 6 FOLFOX plus zolbetuximab 01/24/2024 Cycle 7 FOLFOX plus zolbetuximab 02/06/2024 Cycle 8 FOLFOX plus zolbetuximab 02/21/2024 CT CAP 02/28/2024: Unchanged diffuse gastric thickening, unchanged hypodense liver lesion, slightly diminished ascites and peritoneal carcinomatosis, interval resolution of the small bilateral pleural effusions. Cycle 9 FOLFOX plus zolbetuximab 03/06/2024 Cycle 10 FOLFOX plus zolbetuximab 03/20/2024 Cycle 11 FOLFOX plus zolbetuximab 04/10/2024, 5-FU bolus and oxaliplatin  held due to neutropenia Cycle 12 FOLFOX plus zolbetuximab 04/24/2024 Cycle 13 FOLFOX plus zolbetuximab 05/15/2024,  5-FU bolus and oxaliplatin  held due to neutropenia   Truncal rash-status post recent punch biopsy with pathology pending; biopsy reported negative per family 04/26/2022 Lichen planus of the feet Hypothyroidism Atrial fibrillation Admission 07/24/2022 with acute bilateral pulmonary embolism/right heart strain Heparin  anticoagulation 07/24/2022 Doppler 07/24/2022-acute DVT of the right femoral, popliteal, posterior tibial, and peroneal veins, acute left posterior tibial DVT Lovenox  anticoagulation, changed to once daily dosing 05/25/2023 Lovenox  changed to 40 mg daily 08/30/2023 7.  Anemia secondary to chemotherapy, phlebotomy, and hemoptysis-improved 8.  L1 compression fracture on plain x-ray 12/18/2022 MRI lumbar spine 02/02/2023-acute/subacute L1 compression fracture, mild degenerative change of the lumbar spine 9.  Admission 08/14/2023 with hematemesis 10. C. Difficile positive by PCR-completed course of Vancomycin  11.  Admission 12/29/2023 with bilateral pulmonary embolism-heparin  Dopplers 01/01/2024: Acute right common femoral and popliteal DVT 01/03/2024 heparin  discontinued, Lovenox  60 mg twice daily 12.  Diffuse pruritic rash-etiology?  Improved    Disposition: Todd Harrison appears stable.  He has completed 12 cycles of FOLFOX plus zolbetuximab.  He continues to tolerate treatment well.  There is no clinical evidence of disease progression.  Plan to proceed with cycle 13 tomorrow as scheduled.  5-FU bolus and oxaliplatin  will be held due to neutropenia.  CBC and chemistry panel reviewed.  Labs are adequate for treatment.  He has mild neutropenia.  He understands to contact the office with fever, chills, other signs of infection.  He will receive IV fluids 05/16/2024.  He will return for an office visit and the next cycle of FOLFOX plus zolbetuximab in 2 weeks.  We are available to see him sooner if needed.  Olam Ned ANP/GNP-BC   05/14/2024  2:22 PM        "

## 2024-05-14 NOTE — Patient Instructions (Signed)

## 2024-05-15 ENCOUNTER — Other Ambulatory Visit: Payer: Self-pay

## 2024-05-15 ENCOUNTER — Inpatient Hospital Stay: Payer: Medicare (Managed Care)

## 2024-05-15 VITALS — BP 94/58 | HR 60 | Temp 98.0°F | Resp 16

## 2024-05-15 DIAGNOSIS — Z5111 Encounter for antineoplastic chemotherapy: Secondary | ICD-10-CM | POA: Diagnosis not present

## 2024-05-15 DIAGNOSIS — C169 Malignant neoplasm of stomach, unspecified: Secondary | ICD-10-CM

## 2024-05-15 MED ORDER — APREPITANT 130 MG/18ML IV EMUL
130.0000 mg | Freq: Once | INTRAVENOUS | Status: AC
  Administered 2024-05-15: 130 mg via INTRAVENOUS
  Filled 2024-05-15: qty 18

## 2024-05-15 MED ORDER — SODIUM CHLORIDE 0.9 % IV SOLN: INTRAVENOUS | Status: DC

## 2024-05-15 MED ORDER — PROCHLORPERAZINE MALEATE 10 MG PO TABS
5.0000 mg | ORAL_TABLET | Freq: Once | ORAL | Status: AC
  Administered 2024-05-15: 5 mg via ORAL
  Filled 2024-05-15: qty 1

## 2024-05-15 MED ORDER — SODIUM CHLORIDE 0.9 % IV SOLN
400.0000 mg/m2 | Freq: Once | INTRAVENOUS | Status: AC
  Administered 2024-05-15: 856 mg via INTRAVENOUS
  Filled 2024-05-15: qty 42.8

## 2024-05-15 MED ORDER — OLANZAPINE 5 MG PO TABS
5.0000 mg | ORAL_TABLET | Freq: Once | ORAL | Status: AC
  Administered 2024-05-15: 5 mg via ORAL
  Filled 2024-05-15: qty 1

## 2024-05-15 MED ORDER — SODIUM CHLORIDE 0.9 % IV SOLN
2000.0000 mg/m2 | INTRAVENOUS | Status: DC
  Administered 2024-05-15: 4300 mg via INTRAVENOUS
  Filled 2024-05-15: qty 86

## 2024-05-15 MED ORDER — DEXAMETHASONE SOD PHOSPHATE PF 10 MG/ML IJ SOLN
10.0000 mg | Freq: Once | INTRAMUSCULAR | Status: AC
  Administered 2024-05-15: 10 mg via INTRAVENOUS
  Filled 2024-05-15: qty 1

## 2024-05-15 MED ORDER — FAMOTIDINE IN NACL 20-0.9 MG/50ML-% IV SOLN
20.0000 mg | Freq: Once | INTRAVENOUS | Status: AC
  Administered 2024-05-15: 20 mg via INTRAVENOUS
  Filled 2024-05-15: qty 50

## 2024-05-15 MED ORDER — PALONOSETRON HCL INJECTION 0.25 MG/5ML
0.2500 mg | Freq: Once | INTRAVENOUS | Status: AC
  Administered 2024-05-15: 0.25 mg via INTRAVENOUS
  Filled 2024-05-15: qty 5

## 2024-05-15 MED ORDER — ZOLBETUXIMAB-CLZB CHEMO 100MG/5ML IV SOLN
400.0000 mg/m2 | Freq: Once | INTRAVENOUS | Status: AC
  Administered 2024-05-15: 855 mg via INTRAVENOUS
  Filled 2024-05-15: qty 42.75

## 2024-05-15 MED ORDER — DIPHENHYDRAMINE HCL 50 MG/ML IJ SOLN
25.0000 mg | Freq: Once | INTRAMUSCULAR | Status: AC
  Administered 2024-05-15: 25 mg via INTRAVENOUS
  Filled 2024-05-15: qty 1

## 2024-05-15 MED ORDER — LORAZEPAM 1 MG PO TABS: 0.5000 mg | ORAL_TABLET | ORAL | Status: DC | PRN

## 2024-05-15 NOTE — Patient Instructions (Signed)
 [facility] - A DEPT OF [facility]  Discharge Instructions: Thank you for choosing [facility] to provide your oncology and hematology care.   If you have a lab appointment with the Cancer Center, please go directly to the Cancer Center and check in at the registration area.   Wear comfortable clothing and clothing appropriate for easy access to any Portacath or PICC line.   We strive to give you quality time with your provider. You may need to reschedule your appointment if you arrive late (15 or more minutes).  Arriving late affects you and other patients whose appointments are after yours.  Also, if you miss three or more appointments without notifying the office, you may be dismissed from the clinic at the providers discretion.      For prescription refill requests, have your pharmacy contact our office and allow 72 hours for refills to be completed.    Today you received the following chemotherapy and/or immunotherapy agents: vyloy , leucovorin  & fluorouracil .   To help prevent nausea and vomiting after your treatment, we encourage you to take your nausea medication as directed.  BELOW ARE SYMPTOMS THAT SHOULD BE REPORTED IMMEDIATELY: *FEVER GREATER THAN 100.4 F (38 C) OR HIGHER *CHILLS OR SWEATING *NAUSEA AND VOMITING THAT IS NOT CONTROLLED WITH YOUR NAUSEA MEDICATION *UNUSUAL SHORTNESS OF BREATH *UNUSUAL BRUISING OR BLEEDING *URINARY PROBLEMS (pain or burning when urinating, or frequent urination) *BOWEL PROBLEMS (unusual diarrhea, constipation, pain near the anus) TENDERNESS IN MOUTH AND THROAT WITH OR WITHOUT PRESENCE OF ULCERS (sore throat, sores in mouth, or a toothache) UNUSUAL RASH, SWELLING OR PAIN  UNUSUAL VAGINAL DISCHARGE OR ITCHING   Items with * indicate a potential emergency and should be followed up as soon as possible or go to the Emergency Department if any problems should occur.  Please show the CHEMOTHERAPY ALERT  CARD or IMMUNOTHERAPY ALERT CARD at check-in to the Emergency Department and triage nurse.  Should you have questions after your visit or need to cancel or reschedule your appointment, please contact [facility] - A DEPT OF [facility]  Dept: [phone]  and follow the prompts.  Office hours are 8:00 a.m. to 4:30 p.m. Monday - Friday. Please note that voicemails left after 4:00 p.m. may not be returned until the following business day.  We are closed weekends and major holidays. You have access to a nurse at all times for urgent questions. Please call the main number to the clinic Dept: [phone] and follow the prompts.   For any non-urgent questions, you may also contact your provider using MyChart. We now offer e-Visits for anyone 52 and older to request care online for non-urgent symptoms. For details visit [email or web address].   Also download the MyChart app! Go to the app store, search MyChart, open the app, select [place], and log in with your MyChart username and password.

## 2024-05-16 ENCOUNTER — Inpatient Hospital Stay: Payer: Medicare (Managed Care)

## 2024-05-16 VITALS — BP 84/55 | HR 65 | Temp 98.0°F | Resp 16

## 2024-05-16 DIAGNOSIS — C169 Malignant neoplasm of stomach, unspecified: Secondary | ICD-10-CM

## 2024-05-16 DIAGNOSIS — Z5111 Encounter for antineoplastic chemotherapy: Secondary | ICD-10-CM | POA: Diagnosis not present

## 2024-05-16 MED ORDER — SODIUM CHLORIDE 0.9 % IV SOLN: Freq: Once | INTRAVENOUS | Status: AC

## 2024-05-16 NOTE — Patient Instructions (Signed)

## 2024-05-17 ENCOUNTER — Inpatient Hospital Stay: Payer: Medicare (Managed Care)

## 2024-05-25 ENCOUNTER — Other Ambulatory Visit: Payer: Self-pay | Admitting: Oncology

## 2024-05-28 ENCOUNTER — Other Ambulatory Visit (HOSPITAL_BASED_OUTPATIENT_CLINIC_OR_DEPARTMENT_OTHER): Payer: Self-pay

## 2024-05-28 ENCOUNTER — Inpatient Hospital Stay: Payer: Medicare (Managed Care) | Admitting: Nurse Practitioner

## 2024-05-28 ENCOUNTER — Other Ambulatory Visit: Payer: Self-pay

## 2024-05-28 ENCOUNTER — Inpatient Hospital Stay: Payer: Medicare (Managed Care)

## 2024-05-28 ENCOUNTER — Encounter: Payer: Self-pay | Admitting: Nurse Practitioner

## 2024-05-28 VITALS — BP 101/85 | HR 81 | Temp 97.8°F | Resp 18 | Ht 71.0 in | Wt 195.4 lb

## 2024-05-28 DIAGNOSIS — Z5111 Encounter for antineoplastic chemotherapy: Secondary | ICD-10-CM | POA: Diagnosis not present

## 2024-05-28 DIAGNOSIS — C169 Malignant neoplasm of stomach, unspecified: Secondary | ICD-10-CM | POA: Diagnosis not present

## 2024-05-28 LAB — CMP (CANCER CENTER ONLY)
ALT: 8 U/L (ref 0–44)
AST: 18 U/L (ref 15–41)
Albumin: 3.4 g/dL — ABNORMAL LOW (ref 3.5–5.0)
Alkaline Phosphatase: 61 U/L (ref 38–126)
Anion gap: 9 (ref 5–15)
BUN: 24 mg/dL — ABNORMAL HIGH (ref 8–23)
CO2: 25 mmol/L (ref 22–32)
Calcium: 8.9 mg/dL (ref 8.9–10.3)
Chloride: 103 mmol/L (ref 98–111)
Creatinine: 0.69 mg/dL (ref 0.61–1.24)
GFR, Estimated: >60 mL/min
Glucose, Bld: 105 mg/dL — ABNORMAL HIGH (ref 70–99)
Potassium: 4.1 mmol/L (ref 3.5–5.1)
Sodium: 138 mmol/L (ref 135–145)
Total Bilirubin: 0.6 mg/dL (ref 0.0–1.2)
Total Protein: 5.9 g/dL — ABNORMAL LOW (ref 6.5–8.1)

## 2024-05-28 LAB — CBC WITH DIFFERENTIAL (CANCER CENTER ONLY)
Abs Immature Granulocytes: 0.02 K/uL (ref 0.00–0.07)
Basophils Absolute: 0.1 K/uL (ref 0.0–0.1)
Basophils Relative: 1 %
Eosinophils Absolute: 0.7 K/uL — ABNORMAL HIGH (ref 0.0–0.5)
Eosinophils Relative: 9 %
HCT: 32.5 % — ABNORMAL LOW (ref 39.0–52.0)
Hemoglobin: 10.6 g/dL — ABNORMAL LOW (ref 13.0–17.0)
Immature Granulocytes: 0 %
Lymphocytes Relative: 25 %
Lymphs Abs: 2 K/uL (ref 0.7–4.0)
MCH: 29 pg (ref 26.0–34.0)
MCHC: 32.6 g/dL (ref 30.0–36.0)
MCV: 88.8 fL (ref 80.0–100.0)
Monocytes Absolute: 0.6 K/uL (ref 0.1–1.0)
Monocytes Relative: 8 %
Neutro Abs: 4.5 K/uL (ref 1.7–7.7)
Neutrophils Relative %: 57 %
Platelet Count: 147 K/uL — ABNORMAL LOW (ref 150–400)
RBC: 3.66 MIL/uL — ABNORMAL LOW (ref 4.22–5.81)
RDW: 15.6 % — ABNORMAL HIGH (ref 11.5–15.5)
WBC Count: 7.9 K/uL (ref 4.0–10.5)
nRBC: 0 % (ref 0.0–0.2)

## 2024-05-28 LAB — PHOSPHORUS: Phosphorus: 4 mg/dL (ref 2.5–4.6)

## 2024-05-28 LAB — MAGNESIUM: Magnesium: 2.2 mg/dL (ref 1.7–2.4)

## 2024-05-28 MED ORDER — HYDROXYZINE HCL 25 MG PO TABS
25.0000 mg | ORAL_TABLET | ORAL | 2 refills | Status: DC | PRN
  Filled 2024-05-28: qty 30, 5d supply, fill #0

## 2024-05-28 NOTE — Patient Instructions (Signed)

## 2024-05-28 NOTE — Progress Notes (Signed)
 " Todd Harrison OFFICE PROGRESS NOTE   Diagnosis: Gastric cancer  INTERVAL HISTORY:   Todd Harrison returns as scheduled.  He completed another cycle of FOLFOX plus zolbetuximab 05/15/2024.  Oxaliplatin  and bolus 5-FU were held due to neutropenia.  He tends to have mild nausea on day 1.  No mouth sores.  No change in baseline bowel bowel habits.  He denies numbness/tingling in the hands and feet.  No pain.  No bleeding, specifically no hemoptysis.  Objective:  Vital signs in last 24 hours:  Blood pressure 101/85, pulse 81, temperature 97.8 F (36.6 C), temperature source Temporal, resp. rate 18, height 5' 11 (1.803 m), weight 195 lb 6.4 oz (88.6 kg), SpO2 100%.    HEENT: No thrush or ulcers. Resp: Lungs clear bilaterally. Cardio: Regular rate and rhythm. GI: No hepatosplenomegaly.  Nontender. Vascular: No leg edema.  Right lower leg is slightly larger than the left lower leg. Neuro: Alert and oriented. Skin: Palms without erythema. Port-A-Cath without erythema.  Lab Results:  Lab Results  Component Value Date   WBC 7.9 05/28/2024   HGB 10.6 (L) 05/28/2024   HCT 32.5 (L) 05/28/2024   MCV 88.8 05/28/2024   PLT 147 (L) 05/28/2024   NEUTROABS 4.5 05/28/2024    Imaging:  No results found.  Medications: I have reviewed the patient's current medications.  Assessment/Plan: 1. Gastric cancer with CT evidence of abdominal carcinomatosis CT abdomen/pelvis 04/13/2022-ascites, diffuse omental and peritoneal nodularity, possible circumferential mass in the transverse colon, new small left greater than right pleural effusions CT abdominal mass biopsy 04/24/2022-metastatic poorly differentiated adenocarcinoma with very focal signet ring cell features CT paracentesis 04/24/2022-no malignant cells identified Colonoscopy 04/25/2022-diverticulosis in the sigmoid colon.  Internal hemorrhoids.  No specimens collected. Upper endoscopy 04/25/2022-large infiltrative mass with oozing  bleeding and stigmata of recent bleeding in the gastric fundus, on the anterior wall of the gastric body and on the greater curvature of the gastric body; deformity in the gastric antrum.  Extrinsic deformity in the entire duodenum.  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 Her2 by IHC negative, 1+; MMR IHC normal; PD-L1 CPS 1%, positive. Claudin 18 positive, 100% Cycle 1 FOLFOX 05/04/2022 Cycle 2 FOLFOX 05/17/2022 Cycle 3 FOLFOX 05/31/2022-oxaliplatin  dose reduced secondary to prolonged cold sensitivity Cycle 4 FOLFOX 06/14/2022 Cycle 5 FOLFOX 06/28/2022 CT abdomen/pelvis 07/08/2022-asymmetric wall thickening at the lesser curvature of the stomach, decreased ascites, increased diffuse omental and peritoneal nodularity Cycle 6 FOLFOX 07/12/2022 Cycle 7 FOLFOX 08/09/2022 Cycle 8 FOLFOX 08/23/2022 Cycle 9 FOLFOX 09/06/2022 CTs 09/11/2022-stable, no new or progressive interval findings Cycle 10 FOLFOX 09/20/2022 Xeloda  maintenance 2 weeks on/1 week off 10/16/2022 Cycle 2 Xeloda  11/06/2022-Xeloda  dose reduced secondary to early foot skin toxicity Cycle 3 Xeloda  11/27/2022 Cycle 4 Xeloda  12/18/2022 CTs 01/02/2023-wall thickening of the mid gastric body-possibly progressive, unchanged peritoneal/omental caking, unchanged L1 compression fracture Cycle 5 Xeloda  beginning 01/08/2023 Cycle 6 Xeloda  beginning 01/29/2023 Cycle 7 Xeloda  beginning 02/19/2023 Cycle 8 Xeloda  beginning 03/12/2023 Cycle 9 Xeloda  beginning 04/09/2023 Cycle 10 Xeloda  beginning 05/07/2023 CTs 05/21/2023: Unchanged wall thickening of the gastric body, slight increase in small volume ascites, diffuse peritoneal thickening and stranding of the omentum, no evidence of metastatic disease to the chest Cycle 11 Xeloda  beginning 05/28/2023 Cycle 12 Xeloda  beginning 06/18/2023 Cycle 13 Xeloda  beginning 07/09/2023 Cycle 14 Xeloda  beginning 07/30/2023 CTA abdomen/pelvis 08/14/2023: Large volume of heterogenous material in  the gastric lumen-food/blood, small volume ascites with diffuse peritoneal thickening and matting of the bowel/omentum, enhancement  of the gastric body 08/18/2023: EGD-large mass in the gastric fundus/body with no bleeding, clean-based ulcers overlying 1 area of the mass, normal duodenum Cycle 15 Xeloda  09/03/2023 Cycle 16 Xeloda  09/24/2023 CTs 10/31/2023-moderate gastric body wall thickening similar.  Ill-definition of soft tissue planes in the lesser sac and porta hepatis new or progressive.  Development of intrahepatic duct dilatation.  Increase in small volume abdominal ascites with persistent peritoneal metastasis.  New trace left pleural fluid.  New T8 heterogeneous sclerosis and vertebral body height loss. Cycle 1 FOLFOX plus zolbetuximab beginning 11/14/2023 (zolbetuximab 11/14/2023, FOLFOX 11/15/2023) Cycle 2 FOLFOX plus zolbetuximab (zolbetuximab 11/28/23, FOLFOX anticipated 11/29/23) Cycle 3 FOLFOX plus zolbetuximab 12/12/2023 Cycle 4 FOLFOX plus zolbetuximab 12/27/2023 CT abdomen/pelvis 01/01/2024: Improved lower lobe pulmonary embolism, stable gastric wall thickening, infiltrative density in the lesser sac region, tumor caking along the omentum and mesentery-stable, reduced anterior ascites stable 1.1 cm segment 4 liver lesion Cycle 5 FOLFOX plus zolbetuximab 01/10/2024 Cycle 6 FOLFOX plus zolbetuximab 01/24/2024 Cycle 7 FOLFOX plus zolbetuximab 02/06/2024 Cycle 8 FOLFOX plus zolbetuximab 02/21/2024 CT CAP 02/28/2024: Unchanged diffuse gastric thickening, unchanged hypodense liver lesion, slightly diminished ascites and peritoneal carcinomatosis, interval resolution of the small bilateral pleural effusions. Cycle 9 FOLFOX plus zolbetuximab 03/06/2024 Cycle 10 FOLFOX plus zolbetuximab 03/20/2024 Cycle 11 FOLFOX plus zolbetuximab 04/10/2024, 5-FU bolus and oxaliplatin  held due to neutropenia Cycle 12 FOLFOX plus zolbetuximab 04/24/2024 Cycle 13 FOLFOX plus zolbetuximab 05/15/2024, 5-FU bolus and oxaliplatin   held due to neutropenia Cycle 14 FOLFOX plus zolbetuximab 05/29/2024   Truncal rash-status post recent punch biopsy with pathology pending; biopsy reported negative per family 04/26/2022 Lichen planus of the feet Hypothyroidism Atrial fibrillation Admission 07/24/2022 with acute bilateral pulmonary embolism/right heart strain Heparin  anticoagulation 07/24/2022 Doppler 07/24/2022-acute DVT of the right femoral, popliteal, posterior tibial, and peroneal veins, acute left posterior tibial DVT Lovenox  anticoagulation, changed to once daily dosing 05/25/2023 Lovenox  changed to 40 mg daily 08/30/2023 7.  Anemia secondary to chemotherapy, phlebotomy, and hemoptysis-improved 8.  L1 compression fracture on plain x-ray 12/18/2022 MRI lumbar spine 02/02/2023-acute/subacute L1 compression fracture, mild degenerative change of the lumbar spine 9.  Admission 08/14/2023 with hematemesis 10. C. Difficile positive by PCR-completed course of Vancomycin  11.  Admission 12/29/2023 with bilateral pulmonary embolism-heparin  Dopplers 01/01/2024: Acute right common femoral and popliteal DVT 01/03/2024 heparin  discontinued, Lovenox  60 mg twice daily 12.  Diffuse pruritic rash-etiology?  Improved  Disposition: Todd Harrison appears stable.  He continues FOLFOX plus zolbetuximab every 2 weeks.  Overall tolerating treatment well.  No clinical evidence of disease progression.  Plan to proceed with cycle 14 as scheduled on 05/29/2024.  Restaging CTs prior to next office visit.  CBC and chemistry panel reviewed.  Labs adequate for treatment.  He will return for follow-up in 2 weeks.  We are available to see him sooner if needed.  Patient seen with Dr. Cloretta.  Olam Ned ANP/GNP-BC   05/28/2024  3:34 PM  This was a shared visit with Olam Ned.  Todd Harrison appears unchanged.  The white count is higher today.  He will complete another cycle of FOLFOX/zolbetuximab tomorrow.  He will undergo a restaging CT evaluation prior to an  office visit in 2 weeks.  Arvella Cloretta, MD      "

## 2024-05-29 ENCOUNTER — Other Ambulatory Visit (HOSPITAL_BASED_OUTPATIENT_CLINIC_OR_DEPARTMENT_OTHER): Payer: Self-pay

## 2024-05-29 ENCOUNTER — Inpatient Hospital Stay: Payer: Medicare (Managed Care)

## 2024-05-29 ENCOUNTER — Other Ambulatory Visit: Payer: Self-pay | Admitting: *Deleted

## 2024-05-29 ENCOUNTER — Inpatient Hospital Stay: Payer: Medicare (Managed Care) | Admitting: Nurse Practitioner

## 2024-05-29 ENCOUNTER — Encounter: Payer: Self-pay | Admitting: *Deleted

## 2024-05-29 ENCOUNTER — Other Ambulatory Visit: Payer: Self-pay

## 2024-05-29 ENCOUNTER — Encounter: Payer: Self-pay | Admitting: Nurse Practitioner

## 2024-05-29 VITALS — BP 96/63 | HR 64 | Temp 98.2°F | Resp 18

## 2024-05-29 DIAGNOSIS — Z5111 Encounter for antineoplastic chemotherapy: Secondary | ICD-10-CM | POA: Diagnosis not present

## 2024-05-29 DIAGNOSIS — C169 Malignant neoplasm of stomach, unspecified: Secondary | ICD-10-CM

## 2024-05-29 MED ORDER — LEUCOVORIN CALCIUM INJECTION 350 MG
400.0000 mg/m2 | Freq: Once | INTRAVENOUS | Status: AC
  Administered 2024-05-29: 844 mg via INTRAVENOUS
  Filled 2024-05-29: qty 42.2

## 2024-05-29 MED ORDER — PALONOSETRON HCL INJECTION 0.25 MG/5ML
0.2500 mg | Freq: Once | INTRAVENOUS | Status: AC
  Administered 2024-05-29: 0.25 mg via INTRAVENOUS
  Filled 2024-05-29: qty 5

## 2024-05-29 MED ORDER — SODIUM CHLORIDE 0.9 % IV SOLN
2000.0000 mg/m2 | INTRAVENOUS | Status: DC
  Administered 2024-05-29: 4200 mg via INTRAVENOUS
  Filled 2024-05-29: qty 84

## 2024-05-29 MED ORDER — ZOLBETUXIMAB-CLZB CHEMO 100MG/5ML IV SOLN
400.0000 mg/m2 | Freq: Once | INTRAVENOUS | Status: AC
  Administered 2024-05-29: 845 mg via INTRAVENOUS
  Filled 2024-05-29: qty 42.25

## 2024-05-29 MED ORDER — PROCHLORPERAZINE MALEATE 10 MG PO TABS
5.0000 mg | ORAL_TABLET | Freq: Once | ORAL | Status: AC
  Administered 2024-05-29: 5 mg via ORAL
  Filled 2024-05-29: qty 1

## 2024-05-29 MED ORDER — PROCHLORPERAZINE MALEATE 10 MG PO TABS: 5.0000 mg | ORAL_TABLET | Freq: Once | ORAL | Status: DC

## 2024-05-29 MED ORDER — OLANZAPINE 5 MG PO TABS
5.0000 mg | ORAL_TABLET | Freq: Once | ORAL | Status: AC
  Administered 2024-05-29: 5 mg via ORAL
  Filled 2024-05-29: qty 1

## 2024-05-29 MED ORDER — DEXTROSE 5 % IV SOLN: Freq: Once | INTRAVENOUS | Status: AC

## 2024-05-29 MED ORDER — LORAZEPAM 1 MG PO TABS: 0.5000 mg | ORAL_TABLET | ORAL | Status: DC | PRN

## 2024-05-29 MED ORDER — DEXAMETHASONE SOD PHOSPHATE PF 10 MG/ML IJ SOLN
10.0000 mg | Freq: Once | INTRAMUSCULAR | Status: AC
  Administered 2024-05-29: 10 mg via INTRAVENOUS
  Filled 2024-05-29: qty 1

## 2024-05-29 MED ORDER — APREPITANT 130 MG/18ML IV EMUL
130.0000 mg | Freq: Once | INTRAVENOUS | Status: AC
  Administered 2024-05-29: 130 mg via INTRAVENOUS
  Filled 2024-05-29: qty 18

## 2024-05-29 MED ORDER — DIPHENHYDRAMINE HCL 50 MG/ML IJ SOLN
25.0000 mg | Freq: Once | INTRAMUSCULAR | Status: AC
  Administered 2024-05-29: 25 mg via INTRAVENOUS
  Filled 2024-05-29: qty 1

## 2024-05-29 MED ORDER — FAMOTIDINE IN NACL 20-0.9 MG/50ML-% IV SOLN
20.0000 mg | Freq: Once | INTRAVENOUS | Status: AC
  Administered 2024-05-29: 20 mg via INTRAVENOUS
  Filled 2024-05-29: qty 50

## 2024-05-29 MED ORDER — TRIAMCINOLONE ACETONIDE 0.1 % EX CREA
1.0000 | TOPICAL_CREAM | Freq: Two times a day (BID) | CUTANEOUS | 1 refills | Status: DC
  Filled 2024-05-29: qty 30, 60d supply, fill #0

## 2024-05-29 MED ORDER — SODIUM CHLORIDE 0.9 % IV SOLN: INTRAVENOUS | Status: DC

## 2024-05-29 MED ORDER — FLUOROURACIL CHEMO INJECTION 2.5 GM/50ML
400.0000 mg/m2 | Freq: Once | INTRAVENOUS | Status: AC
  Administered 2024-05-29: 850 mg via INTRAVENOUS
  Filled 2024-05-29: qty 17

## 2024-05-29 MED ORDER — OXALIPLATIN CHEMO INJECTION 100 MG/20ML
65.0000 mg/m2 | Freq: Once | INTRAVENOUS | Status: AC
  Administered 2024-05-29: 150 mg via INTRAVENOUS
  Filled 2024-05-29: qty 20

## 2024-05-29 NOTE — Progress Notes (Signed)
 [name] presented to the office this morning for cycle 14 FOLFOX plus zolbetuximab.  Before beginning treatment vital signs were stable, consistent with baseline.  5 to 10 minutes into the oxaliplatin  infusion he began coughing and expectorating phlegm.  Oxaliplatin  was stopped and IV fluids started.  He complained of pruritus in various locations, dizziness.  Denies shortness of breath and chest pain.  No wheezing.  Blood pressure systolic 54-65; heart rate normal; oxygen saturation in the low 90s.  He was noted to have a rash on both shoulders, anterior chest and upper arms.  Lungs clear bilaterally.  Regular rate and rhythm.  Abdomen soft and nontender.  Alert and oriented.  No acute distress.  He was given Solu-Medrol , Benadryl , Pepcid .  Oxygen initiated at 2 L/min.  Repeat blood pressure at approximately 1510 was 106/58, oxygen saturation 98% on 2 L.  He was reevaluated at 3:30 PM- feels better.  No longer experiencing dizziness.  Previously noted rash is fading.  Vital signs-blood pressure 107/61, heart rate 60, respirations 18, oxygen saturation 100% on 2 L.  Reevaluated at 3:53 PM-continues to feel better.  Pruritus markedly improved.  No significant rash.  Okay to proceed with 5-FU.  [name] agrees.

## 2024-05-29 NOTE — Patient Instructions (Signed)
 [facility] - A DEPT OF [place]  Discharge Instructions: Thank you for choosing [facility] to provide your oncology and hematology care.   If you have a lab appointment with the Cancer Center, please go directly to the Cancer Center and check in at the registration area.   Wear comfortable clothing and clothing appropriate for easy access to any Portacath or PICC line.   We strive to give you quality time with your provider. You may need to reschedule your appointment if you arrive late (15 or more minutes).  Arriving late affects you and other patients whose appointments are after yours.  Also, if you miss three or more appointments without notifying the office, you may be dismissed from the clinic at the provider's discretion.      For prescription refill requests, have your pharmacy contact our office and allow 72 hours for refills to be completed.    Today you received the following chemotherapy and/or immunotherapy agents: vyloy , oxaliplatin , leucovorin , fluorouracil     To help prevent nausea and vomiting after your treatment, we encourage you to take your nausea medication as directed.  BELOW ARE SYMPTOMS THAT SHOULD BE REPORTED IMMEDIATELY: *FEVER GREATER THAN 100.4 F (38 C) OR HIGHER *CHILLS OR SWEATING *NAUSEA AND VOMITING THAT IS NOT CONTROLLED WITH YOUR NAUSEA MEDICATION *UNUSUAL SHORTNESS OF BREATH *UNUSUAL BRUISING OR BLEEDING *URINARY PROBLEMS (pain or burning when urinating, or frequent urination) *BOWEL PROBLEMS (unusual diarrhea, constipation, pain near the anus) TENDERNESS IN MOUTH AND THROAT WITH OR WITHOUT PRESENCE OF ULCERS (sore throat, sores in mouth, or a toothache) UNUSUAL RASH, SWELLING OR PAIN  UNUSUAL VAGINAL DISCHARGE OR ITCHING   Items with * indicate a potential emergency and should be followed up as soon as possible or go to the Emergency Department if any problems should occur.  Please show the  CHEMOTHERAPY ALERT CARD or IMMUNOTHERAPY ALERT CARD at check-in to the Emergency Department and triage nurse.  Should you have questions after your visit or need to cancel or reschedule your appointment, please contact [facility] - A DEPT OF [facility]  Dept: [phone]  and follow the prompts.  Office hours are 8:00 a.m. to 4:30 p.m. Monday - Friday. Please note that voicemails left after 4:00 p.m. may not be returned until the following business day.  We are closed weekends and major holidays. You have access to a nurse at all times for urgent questions. Please call the main number to the clinic Dept: [phone] and follow the prompts.   For any non-urgent questions, you may also contact your provider using MyChart. We now offer e-Visits for anyone 74 and older to request care online for non-urgent symptoms. For details visit [email or web address].   Also download the MyChart app! Go to the app store, search MyChart, open the app, select [facility], and log in with your MyChart username and password.

## 2024-05-29 NOTE — Progress Notes (Signed)
 05/28/24 labs faxed to [facility]

## 2024-05-29 NOTE — Progress Notes (Signed)
 At 1442, pt notified me that he was nauseated and dizzy (approximately 20 minutes into oxaliplatin  and leucovorin  infusion). Vital signs obtained (see flowsheet for multiple entries)- B/P 54/43, P70, R18, O2 sat 94% on room air. Rechecked with a manual cuff 64/40. Immediately turned off oxaliplatin  and leucovorin . IVF (D5%) changed to 989ml/hr with pt semi-recumbent with legs up. Clinic NP notified Giles Ned). See her progress note. IVF were changed out to NS @ 963ml/hour at 1450. Pt received 300ml D5. NS ran until 1602 and pt received approximately 700ml.  When [place] arrived, pt began coughing and had a rash appear on his trunk and arms. Emergency drugs administered per Olam Ned' orders: 1500: solumedrol 125mg  IVP 1503: Benadryl  25mg  IVP 1505: Pepcid  20mg  IVPB  At 1517 pt reported symptoms were decreasing. No longer dizzy or nauseated. Rash is itchy but not spreading any further.  At 1535, Olam Ned requested the IVF rate be decreased to 500ml/hour. Itching has decreased now and pt feels much better. Olam was in to see patient several times during the reaction and afterward.  See flowsheets for multiple vital sign readings throughout. Was advised by provider to proceed with 5FU push and pump at 1600. See MAR.   Pt and wife departed at 14 and pt reported feeling back to normal with vital signs at his baseline. He has a return appointment here tomorrow for IVF infusion. They will call with any questions or concerns or proceed to ED for any further signs of an allergic reaction.   Oxaliplatin  added to patient's allergy  list.

## 2024-05-30 ENCOUNTER — Inpatient Hospital Stay: Payer: Medicare (Managed Care)

## 2024-05-30 ENCOUNTER — Telehealth: Payer: Self-pay

## 2024-05-30 ENCOUNTER — Other Ambulatory Visit: Payer: Self-pay | Admitting: *Deleted

## 2024-05-30 DIAGNOSIS — Z5111 Encounter for antineoplastic chemotherapy: Secondary | ICD-10-CM | POA: Diagnosis not present

## 2024-05-30 DIAGNOSIS — C169 Malignant neoplasm of stomach, unspecified: Secondary | ICD-10-CM

## 2024-05-30 MED ORDER — SODIUM CHLORIDE 0.9 % IV SOLN: INTRAVENOUS | Status: AC

## 2024-05-30 NOTE — Telephone Encounter (Signed)
 Called pt to see how he did last night after infusion reaction 05/29/24. Pt states he did fine and had no adverse symptoms. He is coming for IVF today as previously scheduled.

## 2024-05-30 NOTE — Patient Instructions (Signed)

## 2024-05-31 ENCOUNTER — Other Ambulatory Visit: Payer: Self-pay | Admitting: *Deleted

## 2024-05-31 ENCOUNTER — Inpatient Hospital Stay: Payer: Medicare (Managed Care)

## 2024-05-31 ENCOUNTER — Other Ambulatory Visit (HOSPITAL_BASED_OUTPATIENT_CLINIC_OR_DEPARTMENT_OTHER): Payer: Self-pay

## 2024-05-31 ENCOUNTER — Other Ambulatory Visit: Payer: Self-pay | Admitting: Hematology

## 2024-05-31 DIAGNOSIS — Z5111 Encounter for antineoplastic chemotherapy: Secondary | ICD-10-CM | POA: Diagnosis not present

## 2024-05-31 DIAGNOSIS — C169 Malignant neoplasm of stomach, unspecified: Secondary | ICD-10-CM

## 2024-05-31 MED ORDER — SODIUM CHLORIDE 0.9 % IV SOLN: INTRAVENOUS | Status: AC

## 2024-05-31 MED ORDER — MIDODRINE HCL 5 MG PO TABS: 5.0000 mg | ORAL_TABLET | Freq: Three times a day (TID) | ORAL | 0 refills | Status: AC

## 2024-05-31 NOTE — Progress Notes (Signed)
 BP rechecked. Okay per for patient to discharge home per Dr. Lanny. Patient left building via wheelchair accompanied by nurse and family member. No distress noted.  05/31/24 1426  Vitals  BP 90/64

## 2024-05-31 NOTE — Progress Notes (Signed)
 1L IVF completed. VS obtained below. Called, reported to Dr. Lanny. Received instructions to have patient ambulate and observe how he tolerates. Then to report back to MD for further instructions. Patient ambulated with IV pole without assistance to restroom. Steady gait, no distress noted. Denies dizziness. Dr. Lanny arrived chairside.  05/31/24 1410  Vitals  BP (!) 86/51  BP Location Right Arm  Patient Position Supine  Pulse Rate (!) 113

## 2024-06-02 ENCOUNTER — Telehealth: Payer: Self-pay | Admitting: Hematology

## 2024-06-02 NOTE — Telephone Encounter (Signed)
 Brief note  Patient came in today for 5-FU pump DC, he was found to have asymptomatic hypotension with SBP in 70-85.  Chart reviewed.  He is on TPN, FOLFOX and Zolbe for metastatic gastric cancer.  He had infusion reaction to oxaliplatin  2 days ago, resolved.  He reports mild intermittent dizziness at home, no fall.  No fever or chills, otherwise fell at baseline.  Will give him 1 L normal saline in the infusion room, SBP went up to 90.  He was able to ambulate in the infusion room.  Given the history of intermittent hypotension at home, I called in midodrine  for him to use at home if blood pressure still low.  I spoke with his primary oncologist Dr. Cloretta about the above.  Patient was discharged home in stable condition.  Onita Mattock MD 05/31/2024

## 2024-06-04 ENCOUNTER — Other Ambulatory Visit (HOSPITAL_BASED_OUTPATIENT_CLINIC_OR_DEPARTMENT_OTHER): Payer: Self-pay

## 2024-06-04 ENCOUNTER — Ambulatory Visit (HOSPITAL_BASED_OUTPATIENT_CLINIC_OR_DEPARTMENT_OTHER)
Admission: RE | Admit: 2024-06-04 | Discharge: 2024-06-04 | Disposition: A | Discharge status: Home / Self Care | Payer: Medicare (Managed Care) | Source: Ambulatory Visit | Attending: Nurse Practitioner | Admitting: Nurse Practitioner

## 2024-06-04 DIAGNOSIS — C169 Malignant neoplasm of stomach, unspecified: Secondary | ICD-10-CM | POA: Insufficient documentation

## 2024-06-04 MED ORDER — HEPARIN SOD (PORK) LOCK FLUSH 100 UNIT/ML IV SOLN
500.0000 [IU] | Freq: Once | INTRAVENOUS | Status: AC
  Administered 2024-06-04: 500 [IU] via INTRAVENOUS

## 2024-06-04 MED ORDER — IOHEXOL 300 MG/ML  SOLN
100.0000 mL | Freq: Once | INTRAMUSCULAR | Status: AC | PRN
  Administered 2024-06-04: 100 mL via INTRAVENOUS

## 2024-06-05 ENCOUNTER — Other Ambulatory Visit (HOSPITAL_BASED_OUTPATIENT_CLINIC_OR_DEPARTMENT_OTHER): Payer: Self-pay

## 2024-06-05 ENCOUNTER — Other Ambulatory Visit: Payer: Self-pay | Admitting: Nurse Practitioner

## 2024-06-05 DIAGNOSIS — I2699 Other pulmonary embolism without acute cor pulmonale: Secondary | ICD-10-CM

## 2024-06-08 ENCOUNTER — Other Ambulatory Visit: Payer: Self-pay

## 2024-06-09 ENCOUNTER — Other Ambulatory Visit (HOSPITAL_BASED_OUTPATIENT_CLINIC_OR_DEPARTMENT_OTHER): Payer: Self-pay

## 2024-06-09 ENCOUNTER — Other Ambulatory Visit: Payer: Self-pay | Admitting: Oncology

## 2024-06-09 DIAGNOSIS — C169 Malignant neoplasm of stomach, unspecified: Secondary | ICD-10-CM

## 2024-06-09 MED ORDER — ENOXAPARIN SODIUM 60 MG/0.6ML IJ SOSY
60.0000 mg | PREFILLED_SYRINGE | Freq: Two times a day (BID) | INTRAMUSCULAR | 2 refills | Status: AC
  Filled 2024-06-09: qty 36, 30d supply, fill #0

## 2024-06-10 ENCOUNTER — Telehealth: Payer: Self-pay

## 2024-06-10 ENCOUNTER — Other Ambulatory Visit (HOSPITAL_BASED_OUTPATIENT_CLINIC_OR_DEPARTMENT_OTHER): Payer: Self-pay

## 2024-06-10 DIAGNOSIS — C169 Malignant neoplasm of stomach, unspecified: Secondary | ICD-10-CM

## 2024-06-10 MED ORDER — HYDROXYZINE HCL 25 MG PO TABS
25.0000 mg | ORAL_TABLET | ORAL | 2 refills | Status: AC | PRN
  Filled 2024-06-10: qty 30, 5d supply, fill #0

## 2024-06-10 NOTE — Telephone Encounter (Signed)
 Contacted patient to make aware [employer] [employer] [facility] [employer] Medicare sent documentation regarding Hydroxyzine  25 mg prior authorization/ denial letter. Made aware documentation was faxed over to [facility] Prior Authorization team. Patient stated would just pay out of pocket, [facility] stated a 90 day supply would cost $12, refilled prescription, will pick up tomorrow, denies having symptoms with Itching.

## 2024-06-11 ENCOUNTER — Inpatient Hospital Stay: Payer: Medicare (Managed Care)

## 2024-06-11 ENCOUNTER — Other Ambulatory Visit: Payer: Self-pay | Admitting: *Deleted

## 2024-06-11 ENCOUNTER — Encounter: Payer: Self-pay | Admitting: *Deleted

## 2024-06-11 ENCOUNTER — Encounter: Payer: Self-pay | Admitting: Nurse Practitioner

## 2024-06-11 ENCOUNTER — Inpatient Hospital Stay: Payer: Medicare (Managed Care) | Attending: Nurse Practitioner | Admitting: Nurse Practitioner

## 2024-06-11 VITALS — BP 110/64 | HR 89 | Temp 97.8°F | Resp 18 | Ht 71.0 in | Wt 193.9 lb

## 2024-06-11 DIAGNOSIS — C169 Malignant neoplasm of stomach, unspecified: Secondary | ICD-10-CM | POA: Diagnosis not present

## 2024-06-11 DIAGNOSIS — L299 Pruritus, unspecified: Secondary | ICD-10-CM | POA: Insufficient documentation

## 2024-06-11 LAB — CMP (CANCER CENTER ONLY)
ALT: 8 U/L (ref 0–44)
AST: 16 U/L (ref 15–41)
Albumin: 3.3 g/dL — ABNORMAL LOW (ref 3.5–5.0)
Alkaline Phosphatase: 68 U/L (ref 38–126)
Anion gap: 9 (ref 5–15)
BUN: 25 mg/dL — ABNORMAL HIGH (ref 8–23)
CO2: 25 mmol/L (ref 22–32)
Calcium: 8.8 mg/dL — ABNORMAL LOW (ref 8.9–10.3)
Chloride: 104 mmol/L (ref 98–111)
Creatinine: 0.75 mg/dL (ref 0.61–1.24)
GFR, Estimated: >60 mL/min
Glucose, Bld: 115 mg/dL — ABNORMAL HIGH (ref 70–99)
Potassium: 4.1 mmol/L (ref 3.5–5.1)
Sodium: 139 mmol/L (ref 135–145)
Total Bilirubin: 0.5 mg/dL (ref 0.0–1.2)
Total Protein: 5.7 g/dL — ABNORMAL LOW (ref 6.5–8.1)

## 2024-06-11 LAB — CBC WITH DIFFERENTIAL (CANCER CENTER ONLY)
Abs Immature Granulocytes: 0.01 10*3/uL (ref 0.00–0.07)
Basophils Absolute: 0.1 10*3/uL (ref 0.0–0.1)
Basophils Relative: 1 %
Eosinophils Absolute: 0.7 10*3/uL — ABNORMAL HIGH (ref 0.0–0.5)
Eosinophils Relative: 12 %
HCT: 32.5 % — ABNORMAL LOW (ref 39.0–52.0)
Hemoglobin: 10.5 g/dL — ABNORMAL LOW (ref 13.0–17.0)
Immature Granulocytes: 0 %
Lymphocytes Relative: 30 %
Lymphs Abs: 1.9 10*3/uL (ref 0.7–4.0)
MCH: 28.7 pg (ref 26.0–34.0)
MCHC: 32.3 g/dL (ref 30.0–36.0)
MCV: 88.8 fL (ref 80.0–100.0)
Monocytes Absolute: 0.6 10*3/uL (ref 0.1–1.0)
Monocytes Relative: 9 %
Neutro Abs: 3.1 10*3/uL (ref 1.7–7.7)
Neutrophils Relative %: 48 %
Platelet Count: 167 10*3/uL (ref 150–400)
RBC: 3.66 MIL/uL — ABNORMAL LOW (ref 4.22–5.81)
RDW: 15.9 % — ABNORMAL HIGH (ref 11.5–15.5)
WBC Count: 6.4 10*3/uL (ref 4.0–10.5)
nRBC: 0 % (ref 0.0–0.2)

## 2024-06-11 LAB — MAGNESIUM: Magnesium: 2.2 mg/dL (ref 1.7–2.4)

## 2024-06-11 LAB — PHOSPHORUS: Phosphorus: 3.7 mg/dL (ref 2.5–4.6)

## 2024-06-11 NOTE — Patient Instructions (Signed)
 Device With a Small Disc Placed for Long-Term IV Use (Implanted Port): Care at Home An implanted port is a device with a small disc that's put under your skin. In most cases, it's placed in your chest. It can be used to draw blood and send medicines and fluids quickly into your bloodstream. You may need a port to give you: IV medicines that would bother the small veins in your hands or arms. IV medicines for a long time, such as medicines to kill cancer cells (chemotherapy). IV liquid nutrition for a long time. An implanted port has 2 main parts: The portal. This is the round disc where the needle is put in. It may look like a small, raised area under your skin. The catheter. This is a soft tube that connects the port to a vein. You may be able to do more everyday tasks with a port than with other types of long-term IVs. How is my port accessed?  A numbing cream may be put on the skin over the port site. Your skin will be cleaned with a solution that kills germs. Your health care provider will gently pinch the port and put a needle in it. The port will be checked to make sure it's in the vein and working. If you need to get medicine all the time, the needle may be left in the port. Your provider will put a clear bandage over the needle site. The bandage and needle will need to be changed each week. What is flushing? Flushing helps keep the port working like it should. Flush your port as told. If your port is only used from time to time to give medicines or draw blood, you may need to flush it: Before and after medicines have been given. Before and after blood has been drawn. Every 4-6 weeks to keep it working well. If you get medicine through your port all the time, you may not need to flush it. Talk with your provider to learn more. How long will my port stay in? The port will stay in for as long as it's needed. When it's time for it to come out, you'll have a procedure to remove it. Follow  these instructions at home: Caring for your port and port site Flush your port as told. If you need to get an infusion of medicine over a few days, take care of your port as told. Make sure you: Take care of your port site. Wash your hands with soap and water  for at least 20 seconds before and after you change your bandage. If you can't use soap and water , use hand sanitizer. Put any used bandages or infusion bags in a plastic bag. Throw the bag in the trash. Keep the bandage that covers the needle clean and dry. Do not let it get wet. Do not use scissors or sharp objects near the tubes. Keep any tubes clamped, unless they're being used. Check the area around your port site every day for signs of infection. Check for: Redness, swelling, or pain. Fluid or blood. Warmth. Pus or a bad smell. Protect the skin around the port site. Avoid wearing bra straps that rub or irritate the site. Protect the skin around your port from seat belts. Place a soft pad over your chest if needed. Do not take baths, swim, or use a hot tub until you're told it's OK. Ask if you can shower. General instructions  Throw away any syringes in a container that's meant for sharp  items (sharps container). You can buy a sharps container from a pharmacy. You can also make one by using an empty, hard plastic bottle with a lid. Always carry a medical alert card or wear a medical alert bracelet. Ask what things are safe for you to do at home. Ask when you can go back to work or school. Where to find more information To learn more, go to: American Cancer Society at prombar.it. Click on the magnifying glass and type IV lines and ports. Find the link you need. American Society of Clinical Oncology at fabvets.de. Click on the magnifying glass and type getting chemo infusions. Find the link you need. Contact a health care provider if: You can't flush your port. You can't draw blood from the port. You have a fever or  chills. You have any signs of infection. You have swelling in your arm, neck, or shoulder. This information is not intended to replace advice given to you by your health care provider. Make sure you discuss any questions you have with your health care provider. Document Revised: 02/05/2024 Document Reviewed: 02/05/2024 Elsevier Patient Education  2025 [employer].

## 2024-06-11 NOTE — Progress Notes (Signed)
 " [facility] OFFICE PROGRESS NOTE   Diagnosis: Gastric cancer  INTERVAL HISTORY:   Todd Harrison returns as scheduled.  He began cycle 14 FOLFOX plus zolbetuximab 05/29/2024.  5 to 10 minutes into the oxaliplatin  infusion he began coughing and expectorating phlegm.  He developed a pruritic rash across the upper chest and upper arms.  Systolic blood pressure dropped into the 50s/60s, oxygen saturation declined to the low 90s.  He was given Solu-Medrol , Benadryl , Pepcid .  Oxygen initiated.  Symptoms improved/resolved.  He was not rechallenged with oxaliplatin .  He completed the remainder of the treatment.  He has intermittent nausea.  No mouth sores.  No change in baseline bowel habits (loose stools since beginning TPN).  For the past month or so he reports expectorating mucus.  No significant cough.  No shortness of breath.  Occasional wheezing.  No fever.  He is taking Mucinex .  Objective:  Vital signs in last 24 hours:  Blood pressure 110/64, pulse 89, temperature 97.8 F (36.6 C), temperature source Temporal, resp. rate 18, height 5' 11 (1.803 m), weight 193 lb 14.4 oz (88 kg), SpO2 97%.    HEENT: No thrush or ulcers. Resp: Lungs clear bilaterally. Cardio: Regular rate and rhythm. GI: No hepatosplenomegaly. Vascular: Right lower leg is larger than the left lower leg. Neuro: Alert and oriented. Skin: Palms without erythema. Port-A-Cath without erythema.  Lab Results:  Lab Results  Component Value Date   WBC 6.4 06/11/2024   HGB 10.5 (L) 06/11/2024   HCT 32.5 (L) 06/11/2024   MCV 88.8 06/11/2024   PLT 167 06/11/2024   NEUTROABS 3.1 06/11/2024    Imaging:  No results found.  Medications: I have reviewed the patient's current medications.  Assessment/Plan: 1. Gastric cancer with CT evidence of abdominal carcinomatosis CT abdomen/pelvis 04/13/2022-ascites, diffuse omental and peritoneal nodularity, possible circumferential mass in the transverse colon, new  small left greater than right pleural effusions CT abdominal mass biopsy 04/24/2022-metastatic poorly differentiated adenocarcinoma with very focal signet ring cell features CT paracentesis 04/24/2022-no malignant cells identified Colonoscopy 04/25/2022-diverticulosis in the sigmoid colon.  Internal hemorrhoids.  No specimens collected. Upper endoscopy 04/25/2022-large infiltrative mass with oozing bleeding and stigmata of recent bleeding in the gastric fundus, on the anterior wall of the gastric body and on the greater curvature of the gastric body; deformity in the gastric antrum.  Extrinsic deformity in the entire duodenum.  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 Her2 by IHC negative, 1+; MMR IHC normal; PD-L1 CPS 1%, positive. Claudin 18 positive, 100% Cycle 1 FOLFOX 05/04/2022 Cycle 2 FOLFOX 05/17/2022 Cycle 3 FOLFOX 05/31/2022-oxaliplatin  dose reduced secondary to prolonged cold sensitivity Cycle 4 FOLFOX 06/14/2022 Cycle 5 FOLFOX 06/28/2022 CT abdomen/pelvis 07/08/2022-asymmetric wall thickening at the lesser curvature of the stomach, decreased ascites, increased diffuse omental and peritoneal nodularity Cycle 6 FOLFOX 07/12/2022 Cycle 7 FOLFOX 08/09/2022 Cycle 8 FOLFOX 08/23/2022 Cycle 9 FOLFOX 09/06/2022 CTs 09/11/2022-stable, no new or progressive interval findings Cycle 10 FOLFOX 09/20/2022 Xeloda  maintenance 2 weeks on/1 week off 10/16/2022 Cycle 2 Xeloda  11/06/2022-Xeloda  dose reduced secondary to early foot skin toxicity Cycle 3 Xeloda  11/27/2022 Cycle 4 Xeloda  12/18/2022 CTs 01/02/2023-wall thickening of the mid gastric body-possibly progressive, unchanged peritoneal/omental caking, unchanged L1 compression fracture Cycle 5 Xeloda  beginning 01/08/2023 Cycle 6 Xeloda  beginning 01/29/2023 Cycle 7 Xeloda  beginning 02/19/2023 Cycle 8 Xeloda  beginning 03/12/2023 Cycle 9 Xeloda  beginning 04/09/2023 Cycle 10 Xeloda  beginning 05/07/2023 CTs 05/21/2023:  Unchanged wall thickening of the gastric body, slight increase in  small volume ascites, diffuse peritoneal thickening and stranding of the omentum, no evidence of metastatic disease to the chest Cycle 11 Xeloda  beginning 05/28/2023 Cycle 12 Xeloda  beginning 06/18/2023 Cycle 13 Xeloda  beginning 07/09/2023 Cycle 14 Xeloda  beginning 07/30/2023 CTA abdomen/pelvis 08/14/2023: Large volume of heterogenous material in the gastric lumen-food/blood, small volume ascites with diffuse peritoneal thickening and matting of the bowel/omentum, enhancement of the gastric body 08/18/2023: EGD-large mass in the gastric fundus/body with no bleeding, clean-based ulcers overlying 1 area of the mass, normal duodenum Cycle 15 Xeloda  09/03/2023 Cycle 16 Xeloda  09/24/2023 CTs 10/31/2023-moderate gastric body wall thickening similar.  Ill-definition of soft tissue planes in the lesser sac and porta hepatis new or progressive.  Development of intrahepatic duct dilatation.  Increase in small volume abdominal ascites with persistent peritoneal metastasis.  New trace left pleural fluid.  New T8 heterogeneous sclerosis and vertebral body height loss. Cycle 1 FOLFOX plus zolbetuximab beginning 11/14/2023 (zolbetuximab 11/14/2023, FOLFOX 11/15/2023) Cycle 2 FOLFOX plus zolbetuximab (zolbetuximab 11/28/23, FOLFOX anticipated 11/29/23) Cycle 3 FOLFOX plus zolbetuximab 12/12/2023 Cycle 4 FOLFOX plus zolbetuximab 12/27/2023 CT abdomen/pelvis 01/01/2024: Improved lower lobe pulmonary embolism, stable gastric wall thickening, infiltrative density in the lesser sac region, tumor caking along the omentum and mesentery-stable, reduced anterior ascites stable 1.1 cm segment 4 liver lesion Cycle 5 FOLFOX plus zolbetuximab 01/10/2024 Cycle 6 FOLFOX plus zolbetuximab 01/24/2024 Cycle 7 FOLFOX plus zolbetuximab 02/06/2024 Cycle 8 FOLFOX plus zolbetuximab 02/21/2024 CT CAP 02/28/2024: Unchanged diffuse gastric thickening, unchanged hypodense liver lesion, slightly  diminished ascites and peritoneal carcinomatosis, interval resolution of the small bilateral pleural effusions. Cycle 9 FOLFOX plus zolbetuximab 03/06/2024 Cycle 10 FOLFOX plus zolbetuximab 03/20/2024 Cycle 11 FOLFOX plus zolbetuximab 04/10/2024, 5-FU bolus and oxaliplatin  held due to neutropenia Cycle 12 FOLFOX plus zolbetuximab 04/24/2024 Cycle 13 FOLFOX plus zolbetuximab 05/15/2024, 5-FU bolus and oxaliplatin  held due to neutropenia Cycle 14 FOLFOX plus zolbetuximab 05/29/2024 CTs 06/04/2024-no metastatic disease in the chest.  Redemonstration of interstitial lung disease.  New and increased areas of upper lobe subpleural interstitial thickening and ground glass.  Similar trace bilateral pleural effusions.  Similar gastric body wall thickening.  Similar trace abdominopelvic ascites with mild peritoneal thickening.  Stable soft tissue density lesion just cephalad the celiac axis, mildly progressive. Cycle 15 FOLFOX plus zolbetuximab 06/12/2024, oxaliplatin  eliminated from the regimen due to an allergic reaction with cycle 14   Truncal rash-status post recent punch biopsy with pathology pending; biopsy reported negative per family 04/26/2022 Lichen planus of the feet Hypothyroidism Atrial fibrillation Admission 07/24/2022 with acute bilateral pulmonary embolism/right heart strain Heparin  anticoagulation 07/24/2022 Doppler 07/24/2022-acute DVT of the right femoral, popliteal, posterior tibial, and peroneal veins, acute left posterior tibial DVT Lovenox  anticoagulation, changed to once daily dosing 05/25/2023 Lovenox  changed to 40 mg daily 08/30/2023 7.  Anemia secondary to chemotherapy, phlebotomy, and hemoptysis-improved 8.  L1 compression fracture on plain x-ray 12/18/2022 MRI lumbar spine 02/02/2023-acute/subacute L1 compression fracture, mild degenerative change of the lumbar spine 9.  Admission 08/14/2023 with hematemesis 10. C. Difficile positive by PCR-completed course of Vancomycin  11.  Admission  12/29/2023 with bilateral pulmonary embolism-heparin  Dopplers 01/01/2024: Acute right common femoral and popliteal DVT 01/03/2024 heparin  discontinued, Lovenox  60 mg twice daily 12.  Diffuse pruritic rash-etiology?  Improved  Disposition: Todd Harrison appears stable.  He has completed 14 cycles of FOLFOX plus zolbetuximab.  We reviewed the recent restaging CT scan report/images with him and his wife at today's visit.  Overall scans are stable.  They understand and agree with the recommendation to  continue current treatment with the exception of oxaliplatin  which will be eliminated due to a significant allergic reaction with treatment last week.    CBC and chemistry panel reviewed.  Labs adequate to proceed with treatment tomorrow as scheduled.  He will receive a liter of normal saline day 2.  He will return for follow-up and treatment in 2 weeks.  We are available to see him sooner if needed.  Patient seen with Dr. Cloretta.  Olam Ned ANP/GNP-BC   06/11/2024  2:38 PM  This was a shared visit with Olam Ned.  We reviewed the restaging CT findings and images with Todd Harrison.  The overall clinical and radiologic pattern is consistent with stable disease.  He had an allergic reaction during the oxaliplatin  infusion with the last cycle of chemotherapy.  Oxaliplatin  and leucovorin  will be eliminated from the chemotherapy regimen.  The plan is to continue 5-FU/zolbetuximab.  He continues indefinite anticoagulation therapy with the history of recurrent venous thrombosis.  Arvella Cloretta, MD      "

## 2024-06-12 ENCOUNTER — Other Ambulatory Visit (HOSPITAL_BASED_OUTPATIENT_CLINIC_OR_DEPARTMENT_OTHER): Payer: Self-pay

## 2024-06-12 ENCOUNTER — Inpatient Hospital Stay: Payer: Medicare (Managed Care)

## 2024-06-12 ENCOUNTER — Other Ambulatory Visit: Payer: Self-pay | Admitting: *Deleted

## 2024-06-12 ENCOUNTER — Other Ambulatory Visit: Payer: Self-pay

## 2024-06-12 VITALS — BP 87/56 | HR 79 | Temp 98.1°F | Resp 16

## 2024-06-12 DIAGNOSIS — C169 Malignant neoplasm of stomach, unspecified: Secondary | ICD-10-CM

## 2024-06-12 MED ORDER — DEXAMETHASONE SOD PHOSPHATE PF 10 MG/ML IJ SOLN
10.0000 mg | Freq: Once | INTRAMUSCULAR | Status: AC
  Administered 2024-06-12: 10 mg via INTRAVENOUS
  Filled 2024-06-12: qty 1

## 2024-06-12 MED ORDER — APREPITANT 130 MG/18ML IV EMUL
130.0000 mg | Freq: Once | INTRAVENOUS | Status: AC
  Administered 2024-06-12: 130 mg via INTRAVENOUS
  Filled 2024-06-12: qty 18

## 2024-06-12 MED ORDER — DIPHENHYDRAMINE HCL 50 MG/ML IJ SOLN
25.0000 mg | Freq: Once | INTRAMUSCULAR | Status: AC
  Administered 2024-06-12: 25 mg via INTRAVENOUS
  Filled 2024-06-12: qty 1

## 2024-06-12 MED ORDER — SODIUM CHLORIDE 0.9 % IV SOLN: INTRAVENOUS | Status: DC

## 2024-06-12 MED ORDER — FLUOROURACIL CHEMO INJECTION 2.5 GM/50ML
400.0000 mg/m2 | Freq: Once | INTRAVENOUS | Status: AC
  Administered 2024-06-12: 850 mg via INTRAVENOUS
  Filled 2024-06-12: qty 17

## 2024-06-12 MED ORDER — ZOLBETUXIMAB-CLZB CHEMO 100MG/5ML IV SOLN
400.0000 mg/m2 | Freq: Once | INTRAVENOUS | Status: AC
  Administered 2024-06-12: 845 mg via INTRAVENOUS
  Filled 2024-06-12: qty 42.25

## 2024-06-12 MED ORDER — SODIUM CHLORIDE 0.9 % IV SOLN
2000.0000 mg/m2 | INTRAVENOUS | Status: DC
  Administered 2024-06-12: 4200 mg via INTRAVENOUS
  Filled 2024-06-12: qty 84

## 2024-06-12 MED ORDER — TRIAMCINOLONE ACETONIDE 0.1 % EX CREA
1.0000 | TOPICAL_CREAM | Freq: Two times a day (BID) | CUTANEOUS | 0 refills | Status: AC
  Filled 2024-06-12: qty 454, 90d supply, fill #0

## 2024-06-12 MED ORDER — PALONOSETRON HCL INJECTION 0.25 MG/5ML
0.2500 mg | Freq: Once | INTRAVENOUS | Status: AC
  Administered 2024-06-12: 0.25 mg via INTRAVENOUS
  Filled 2024-06-12: qty 5

## 2024-06-12 MED ORDER — OLANZAPINE 5 MG PO TABS
5.0000 mg | ORAL_TABLET | Freq: Once | ORAL | Status: AC
  Administered 2024-06-12: 5 mg via ORAL
  Filled 2024-06-12: qty 1

## 2024-06-12 MED ORDER — PROCHLORPERAZINE MALEATE 10 MG PO TABS
5.0000 mg | ORAL_TABLET | Freq: Once | ORAL | Status: AC
  Administered 2024-06-12: 5 mg via ORAL
  Filled 2024-06-12: qty 1

## 2024-06-12 NOTE — Progress Notes (Signed)
 Pt monitored for 2 hours post Vyloy  infusion w no adverse reaction noted. Pt left clinic w daughter @ [phone]

## 2024-06-12 NOTE — Patient Instructions (Signed)
 [facility] - A DEPT OF [facility]  Discharge Instructions: Thank you for choosing [facility] to provide your oncology and hematology care.   If you have a lab appointment with the Cancer Center, please go directly to the Cancer Center and check in at the registration area.   Wear comfortable clothing and clothing appropriate for easy access to any Portacath or PICC line.   We strive to give you quality time with your provider. You may need to reschedule your appointment if you arrive late (15 or more minutes).  Arriving late affects you and other patients whose appointments are after yours.  Also, if you miss three or more appointments without notifying the office, you may be dismissed from the clinic at the providers discretion.      For prescription refill requests, have your pharmacy contact our office and allow 72 hours for refills to be completed.    Today you received the following chemotherapy and/or immunotherapy agents: vyloy , fluorouracil       To help prevent nausea and vomiting after your treatment, we encourage you to take your nausea medication as directed.  BELOW ARE SYMPTOMS THAT SHOULD BE REPORTED IMMEDIATELY: *FEVER GREATER THAN 100.4 F (38 C) OR HIGHER *CHILLS OR SWEATING *NAUSEA AND VOMITING THAT IS NOT CONTROLLED WITH YOUR NAUSEA MEDICATION *UNUSUAL SHORTNESS OF BREATH *UNUSUAL BRUISING OR BLEEDING *URINARY PROBLEMS (pain or burning when urinating, or frequent urination) *BOWEL PROBLEMS (unusual diarrhea, constipation, pain near the anus) TENDERNESS IN MOUTH AND THROAT WITH OR WITHOUT PRESENCE OF ULCERS (sore throat, sores in mouth, or a toothache) UNUSUAL RASH, SWELLING OR PAIN  UNUSUAL VAGINAL DISCHARGE OR ITCHING   Items with * indicate a potential emergency and should be followed up as soon as possible or go to the Emergency Department if any problems should occur.  Please show the CHEMOTHERAPY ALERT CARD or  IMMUNOTHERAPY ALERT CARD at check-in to the Emergency Department and triage nurse.  Should you have questions after your visit or need to cancel or reschedule your appointment, please contact [facility] - A DEPT OF [facility]  Dept: [phone]  and follow the prompts.  Office hours are 8:00 a.m. to 4:30 p.m. Monday - Friday. Please note that voicemails left after 4:00 p.m. may not be returned until the following business day.  We are closed weekends and major holidays. You have access to a nurse at all times for urgent questions. Please call the main number to the clinic Dept: [phone] and follow the prompts.   For any non-urgent questions, you may also contact your provider using MyChart. We now offer e-Visits for anyone 48 and older to request care online for non-urgent symptoms. For details visit [email or web address].   Also download the MyChart app! Go to the app store, search MyChart, open the app, select [facility], and log in with your MyChart username and password.

## 2024-06-13 ENCOUNTER — Encounter: Payer: Self-pay | Admitting: *Deleted

## 2024-06-13 ENCOUNTER — Inpatient Hospital Stay: Payer: Medicare (Managed Care)

## 2024-06-13 VITALS — BP 81/58 | HR 62 | Temp 98.2°F | Resp 18

## 2024-06-13 DIAGNOSIS — C169 Malignant neoplasm of stomach, unspecified: Secondary | ICD-10-CM

## 2024-06-13 MED ORDER — SODIUM CHLORIDE 0.9 % IV SOLN: INTRAVENOUS | Status: AC

## 2024-06-13 NOTE — Patient Instructions (Signed)
 Device With a Small Disc Placed for Long-Term IV Use (Implanted Port): Care at Home An implanted port is a device with a small disc that's put under your skin. In most cases, it's placed in your chest. It can be used to draw blood and send medicines and fluids quickly into your bloodstream. You may need a port to give you: IV medicines that would bother the small veins in your hands or arms. IV medicines for a long time, such as medicines to kill cancer cells (chemotherapy). IV liquid nutrition for a long time. An implanted port has 2 main parts: The portal. This is the round disc where the needle is put in. It may look like a small, raised area under your skin. The catheter. This is a soft tube that connects the port to a vein. You may be able to do more everyday tasks with a port than with other types of long-term IVs. How is my port accessed?  A numbing cream may be put on the skin over the port site. Your skin will be cleaned with a solution that kills germs. Your health care provider will gently pinch the port and put a needle in it. The port will be checked to make sure it's in the vein and working. If you need to get medicine all the time, the needle may be left in the port. Your provider will put a clear bandage over the needle site. The bandage and needle will need to be changed each week. What is flushing? Flushing helps keep the port working like it should. Flush your port as told. If your port is only used from time to time to give medicines or draw blood, you may need to flush it: Before and after medicines have been given. Before and after blood has been drawn. Every 4-6 weeks to keep it working well. If you get medicine through your port all the time, you may not need to flush it. Talk with your provider to learn more. How long will my port stay in? The port will stay in for as long as it's needed. When it's time for it to come out, you'll have a procedure to remove it. Follow  these instructions at home: Caring for your port and port site Flush your port as told. If you need to get an infusion of medicine over a few days, take care of your port as told. Make sure you: Take care of your port site. Wash your hands with soap and water  for at least 20 seconds before and after you change your bandage. If you can't use soap and water , use hand sanitizer. Put any used bandages or infusion bags in a plastic bag. Throw the bag in the trash. Keep the bandage that covers the needle clean and dry. Do not let it get wet. Do not use scissors or sharp objects near the tubes. Keep any tubes clamped, unless they're being used. Check the area around your port site every day for signs of infection. Check for: Redness, swelling, or pain. Fluid or blood. Warmth. Pus or a bad smell. Protect the skin around the port site. Avoid wearing bra straps that rub or irritate the site. Protect the skin around your port from seat belts. Place a soft pad over your chest if needed. Do not take baths, swim, or use a hot tub until you're told it's OK. Ask if you can shower. General instructions  Throw away any syringes in a container that's meant for sharp  items (sharps container). You can buy a sharps container from a pharmacy. You can also make one by using an empty, hard plastic bottle with a lid. Always carry a medical alert card or wear a medical alert bracelet. Ask what things are safe for you to do at home. Ask when you can go back to work or school. Where to find more information To learn more, go to: American Cancer Society at prombar.it. Click on the magnifying glass and type IV lines and ports. Find the link you need. American Society of Clinical Oncology at fabvets.de. Click on the magnifying glass and type getting chemo infusions. Find the link you need. Contact a health care provider if: You can't flush your port. You can't draw blood from the port. You have a fever or  chills. You have any signs of infection. You have swelling in your arm, neck, or shoulder. This information is not intended to replace advice given to you by your health care provider. Make sure you discuss any questions you have with your health care provider. Document Revised: 02/05/2024 Document Reviewed: 02/05/2024 Elsevier Patient Education  2025 [employer].

## 2024-06-13 NOTE — Progress Notes (Signed)
 Faxed notification from [facility] that his hydroxyzine  25 mg tablet has been approved from 06/10/24 until further notice. Authoriazation #[id number]

## 2024-06-14 ENCOUNTER — Inpatient Hospital Stay: Payer: Medicare (Managed Care)

## 2024-06-25 ENCOUNTER — Inpatient Hospital Stay: Payer: Medicare (Managed Care) | Admitting: Nurse Practitioner

## 2024-06-25 ENCOUNTER — Inpatient Hospital Stay: Payer: Medicare (Managed Care)

## 2024-06-26 ENCOUNTER — Inpatient Hospital Stay: Payer: Medicare (Managed Care)

## 2024-06-27 ENCOUNTER — Inpatient Hospital Stay: Payer: Medicare (Managed Care)

## 2024-06-28 ENCOUNTER — Inpatient Hospital Stay: Payer: Medicare (Managed Care)

## 2024-07-09 ENCOUNTER — Inpatient Hospital Stay: Payer: Medicare (Managed Care)

## 2024-07-09 ENCOUNTER — Inpatient Hospital Stay: Payer: Medicare (Managed Care) | Admitting: Oncology

## 2024-07-10 ENCOUNTER — Inpatient Hospital Stay: Payer: Medicare (Managed Care)

## 2024-07-11 ENCOUNTER — Inpatient Hospital Stay: Payer: Medicare (Managed Care)

## 2024-07-12 ENCOUNTER — Inpatient Hospital Stay: Payer: Medicare (Managed Care) | Attending: Nurse Practitioner

## 2025-05-06 ENCOUNTER — Inpatient Hospital Stay: Payer: Medicare (Managed Care)
